# Patient Record
Sex: Female | Born: 1940 | Race: White | Hispanic: No | Smoking: Never smoker
Health system: Southern US, Community
[De-identification: ages and names within clinical notes are randomized; demographics above are authoritative.]

## PROBLEM LIST (undated history)

## (undated) DIAGNOSIS — T18108A Unspecified foreign body in esophagus causing other injury, initial encounter: Secondary | ICD-10-CM

## (undated) DIAGNOSIS — F32A Depression, unspecified: Secondary | ICD-10-CM

## (undated) DIAGNOSIS — F419 Anxiety disorder, unspecified: Secondary | ICD-10-CM

## (undated) DIAGNOSIS — K219 Gastro-esophageal reflux disease without esophagitis: Secondary | ICD-10-CM

## (undated) DIAGNOSIS — K760 Fatty (change of) liver, not elsewhere classified: Secondary | ICD-10-CM

## (undated) DIAGNOSIS — E669 Obesity, unspecified: Secondary | ICD-10-CM

## (undated) DIAGNOSIS — E039 Hypothyroidism, unspecified: Secondary | ICD-10-CM

## (undated) DIAGNOSIS — E079 Disorder of thyroid, unspecified: Secondary | ICD-10-CM

## (undated) DIAGNOSIS — F329 Major depressive disorder, single episode, unspecified: Secondary | ICD-10-CM

## (undated) HISTORY — DX: Anxiety disorder, unspecified: F41.9

## (undated) HISTORY — DX: Depression, unspecified: F32.A

## (undated) HISTORY — PX: APPENDECTOMY: SHX54

## (undated) HISTORY — DX: Unspecified foreign body in esophagus causing other injury, initial encounter: T18.108A

## (undated) HISTORY — DX: Major depressive disorder, single episode, unspecified: F32.9

## (undated) HISTORY — PX: TONSILLECTOMY: SUR1361

---

## 2002-12-27 DIAGNOSIS — F411 Generalized anxiety disorder: Secondary | ICD-10-CM | POA: Insufficient documentation

## 2003-03-08 DIAGNOSIS — M549 Dorsalgia, unspecified: Secondary | ICD-10-CM | POA: Insufficient documentation

## 2009-07-03 DIAGNOSIS — K222 Esophageal obstruction: Secondary | ICD-10-CM | POA: Insufficient documentation

## 2009-07-03 HISTORY — DX: Esophageal obstruction: K22.2

## 2010-11-15 ENCOUNTER — Ambulatory Visit: Payer: Self-pay | Admitting: Family Medicine

## 2011-11-09 ENCOUNTER — Emergency Department: Payer: Self-pay | Admitting: Emergency Medicine

## 2012-01-12 ENCOUNTER — Ambulatory Visit: Payer: Self-pay | Admitting: Gastroenterology

## 2012-03-25 DIAGNOSIS — F329 Major depressive disorder, single episode, unspecified: Secondary | ICD-10-CM | POA: Insufficient documentation

## 2012-03-25 DIAGNOSIS — F32A Depression, unspecified: Secondary | ICD-10-CM | POA: Insufficient documentation

## 2012-03-25 DIAGNOSIS — E039 Hypothyroidism, unspecified: Secondary | ICD-10-CM | POA: Insufficient documentation

## 2012-06-21 ENCOUNTER — Emergency Department: Payer: Self-pay | Admitting: Emergency Medicine

## 2012-06-30 ENCOUNTER — Ambulatory Visit: Payer: Self-pay | Admitting: Orthopedic Surgery

## 2012-08-09 ENCOUNTER — Ambulatory Visit: Payer: Self-pay | Admitting: Internal Medicine

## 2012-12-27 ENCOUNTER — Ambulatory Visit: Payer: Self-pay | Admitting: Gastroenterology

## 2013-07-25 ENCOUNTER — Ambulatory Visit: Payer: Self-pay | Admitting: Internal Medicine

## 2013-07-25 LAB — CBC CANCER CENTER
Basophil: 1 %
EOS PCT: 2 %
HCT: 46.1 % (ref 35.0–47.0)
HGB: 14.6 g/dL (ref 12.0–16.0)
Lymphocytes: 28 %
MCH: 27.5 pg (ref 26.0–34.0)
MCHC: 31.6 g/dL — ABNORMAL LOW (ref 32.0–36.0)
MCV: 87 fL (ref 80–100)
Monocytes: 7 %
Platelet: 77 x10 3/mm — ABNORMAL LOW (ref 150–440)
RBC: 5.3 10*6/uL — ABNORMAL HIGH (ref 3.80–5.20)
RDW: 23.1 % — ABNORMAL HIGH (ref 11.5–14.5)
Segmented Neutrophils: 60 %
VARIANT LYMPHOCYTE - H4-RLYMPH: 2 %
WBC: 3.1 x10 3/mm — ABNORMAL LOW (ref 3.6–11.0)

## 2013-07-25 LAB — RETICULOCYTES
ABSOLUTE RETIC COUNT: 0.0448 10*6/uL (ref 0.019–0.186)
Reticulocyte: 0.84 % (ref 0.4–3.1)

## 2013-07-25 LAB — APTT: Activated PTT: 39.1 secs — ABNORMAL HIGH (ref 23.6–35.9)

## 2013-07-25 LAB — IRON AND TIBC
IRON BIND. CAP.(TOTAL): 325 ug/dL (ref 250–450)
IRON SATURATION: 41 %
Iron: 134 ug/dL (ref 50–170)
Unbound Iron-Bind.Cap.: 191 ug/dL

## 2013-07-25 LAB — PROTIME-INR
INR: 1.2
Prothrombin Time: 14.7 secs (ref 11.5–14.7)

## 2013-07-25 LAB — FOLATE: Folic Acid: 5.8 ng/mL (ref 3.1–100.0)

## 2013-07-25 LAB — FIBRIN DEGRADATION PROD.(ARMC ONLY): Fibrin Degradation Prod.: <10 ug/ml (ref 2.1–7.7)

## 2013-07-25 LAB — FERRITIN: FERRITIN (ARMC): 36 ng/mL (ref 8–388)

## 2013-07-25 LAB — LACTATE DEHYDROGENASE: LDH: 251 U/L — AB (ref 81–246)

## 2013-07-25 LAB — FIBRINOGEN: Fibrinogen: 339 mg/dL (ref 210–470)

## 2013-07-26 LAB — URINE IEP, RANDOM

## 2013-08-10 ENCOUNTER — Ambulatory Visit: Payer: Self-pay | Admitting: Internal Medicine

## 2013-08-19 ENCOUNTER — Ambulatory Visit: Payer: Self-pay | Admitting: Internal Medicine

## 2013-08-19 ENCOUNTER — Emergency Department: Payer: Self-pay | Admitting: Emergency Medicine

## 2013-08-19 LAB — CBC WITH DIFFERENTIAL/PLATELET
Basophil #: 0.1 10*3/uL (ref 0.0–0.1)
Basophil %: 1.2 %
EOS PCT: 3.3 %
Eosinophil #: 0.2 10*3/uL (ref 0.0–0.7)
HCT: 49 % — AB (ref 35.0–47.0)
HGB: 15.8 g/dL (ref 12.0–16.0)
LYMPHS ABS: 1 10*3/uL (ref 1.0–3.6)
Lymphocyte %: 21.1 %
MCH: 28.7 pg (ref 26.0–34.0)
MCHC: 32.3 g/dL (ref 32.0–36.0)
MCV: 89 fL (ref 80–100)
MONO ABS: 0.5 x10 3/mm (ref 0.2–0.9)
Monocyte %: 10.5 %
NEUTROS ABS: 3 10*3/uL (ref 1.4–6.5)
NEUTROS PCT: 63.9 %
Platelet: 88 10*3/uL — ABNORMAL LOW (ref 150–440)
RBC: 5.51 10*6/uL — ABNORMAL HIGH (ref 3.80–5.20)
RDW: 16.5 % — ABNORMAL HIGH (ref 11.5–14.5)
WBC: 4.7 10*3/uL (ref 3.6–11.0)

## 2013-08-19 LAB — URINALYSIS, COMPLETE
BACTERIA: NONE SEEN
Bilirubin,UR: NEGATIVE
Glucose,UR: NEGATIVE mg/dL (ref 0–75)
Ketone: NEGATIVE
NITRITE: NEGATIVE
PROTEIN: NEGATIVE
Ph: 6 (ref 4.5–8.0)
RBC,UR: 7 /HPF (ref 0–5)
Specific Gravity: 1.012 (ref 1.003–1.030)
Squamous Epithelial: 4

## 2013-08-19 LAB — COMPREHENSIVE METABOLIC PANEL
ALBUMIN: 3.7 g/dL (ref 3.4–5.0)
ANION GAP: 6 — AB (ref 7–16)
AST: 43 U/L — AB (ref 15–37)
Alkaline Phosphatase: 118 U/L — ABNORMAL HIGH
BILIRUBIN TOTAL: 0.5 mg/dL (ref 0.2–1.0)
BUN: 10 mg/dL (ref 7–18)
CHLORIDE: 107 mmol/L (ref 98–107)
CREATININE: 0.77 mg/dL (ref 0.60–1.30)
Calcium, Total: 8.9 mg/dL (ref 8.5–10.1)
Co2: 26 mmol/L (ref 21–32)
EGFR (African American): >60
EGFR (Non-African Amer.): >60
GLUCOSE: 95 mg/dL (ref 65–99)
Osmolality: 276 (ref 275–301)
POTASSIUM: 3.9 mmol/L (ref 3.5–5.1)
SGPT (ALT): 33 U/L (ref 12–78)
SODIUM: 139 mmol/L (ref 136–145)
TOTAL PROTEIN: 7.6 g/dL (ref 6.4–8.2)

## 2013-08-19 LAB — LIPASE, BLOOD: Lipase: 159 U/L (ref 73–393)

## 2013-08-20 ENCOUNTER — Emergency Department: Payer: Self-pay | Admitting: Emergency Medicine

## 2013-08-20 LAB — CBC WITH DIFFERENTIAL/PLATELET
Basophil #: 0.1 10*3/uL (ref 0.0–0.1)
Basophil %: 1 %
EOS PCT: 3 %
Eosinophil #: 0.2 10*3/uL (ref 0.0–0.7)
HCT: 49.9 % — ABNORMAL HIGH (ref 35.0–47.0)
HGB: 16.7 g/dL — ABNORMAL HIGH (ref 12.0–16.0)
LYMPHS PCT: 18.1 %
Lymphocyte #: 1 10*3/uL (ref 1.0–3.6)
MCH: 29.6 pg (ref 26.0–34.0)
MCHC: 33.5 g/dL (ref 32.0–36.0)
MCV: 88 fL (ref 80–100)
Monocyte #: 0.5 x10 3/mm (ref 0.2–0.9)
Monocyte %: 9.5 %
NEUTROS PCT: 68.4 %
Neutrophil #: 3.8 10*3/uL (ref 1.4–6.5)
PLATELETS: 103 10*3/uL — AB (ref 150–440)
RBC: 5.66 10*6/uL — AB (ref 3.80–5.20)
RDW: 16.9 % — AB (ref 11.5–14.5)
WBC: 5.6 10*3/uL (ref 3.6–11.0)

## 2013-08-20 LAB — BASIC METABOLIC PANEL
Anion Gap: 8 (ref 7–16)
BUN: 11 mg/dL (ref 7–18)
CALCIUM: 9.4 mg/dL (ref 8.5–10.1)
CHLORIDE: 107 mmol/L (ref 98–107)
CREATININE: 0.93 mg/dL (ref 0.60–1.30)
Co2: 23 mmol/L (ref 21–32)
EGFR (African American): >60
Glucose: 116 mg/dL — ABNORMAL HIGH (ref 65–99)
Osmolality: 276 (ref 275–301)
Potassium: 3.7 mmol/L (ref 3.5–5.1)
Sodium: 138 mmol/L (ref 136–145)

## 2013-08-20 LAB — TROPONIN I

## 2013-08-21 ENCOUNTER — Observation Stay: Payer: Self-pay | Admitting: Internal Medicine

## 2013-08-21 LAB — CK TOTAL AND CKMB (NOT AT ARMC)
CK, Total: 108 U/L
CK, Total: 120 U/L
CK-MB: 1.2 ng/mL (ref 0.5–3.6)
CK-MB: 1.3 ng/mL (ref 0.5–3.6)

## 2013-08-21 LAB — COMPREHENSIVE METABOLIC PANEL
ALBUMIN: 3.8 g/dL (ref 3.4–5.0)
ANION GAP: 6 — AB (ref 7–16)
Alkaline Phosphatase: 95 U/L
BUN: 13 mg/dL (ref 7–18)
Bilirubin,Total: 0.5 mg/dL (ref 0.2–1.0)
CHLORIDE: 105 mmol/L (ref 98–107)
CO2: 28 mmol/L (ref 21–32)
Calcium, Total: 8.6 mg/dL (ref 8.5–10.1)
Creatinine: 1.1 mg/dL (ref 0.60–1.30)
EGFR (African American): 58 — ABNORMAL LOW
EGFR (Non-African Amer.): 50 — ABNORMAL LOW
GLUCOSE: 120 mg/dL — AB (ref 65–99)
Osmolality: 279 (ref 275–301)
Potassium: 3.9 mmol/L (ref 3.5–5.1)
SGOT(AST): 44 U/L — ABNORMAL HIGH (ref 15–37)
SGPT (ALT): 32 U/L (ref 12–78)
SODIUM: 139 mmol/L (ref 136–145)
TOTAL PROTEIN: 7.6 g/dL (ref 6.4–8.2)

## 2013-08-21 LAB — URINALYSIS, COMPLETE
Bacteria: NONE SEEN
Bilirubin,UR: NEGATIVE
GLUCOSE, UR: NEGATIVE mg/dL (ref 0–75)
Ketone: NEGATIVE
Leukocyte Esterase: NEGATIVE
Nitrite: NEGATIVE
Ph: 5 (ref 4.5–8.0)
Specific Gravity: 1.021 (ref 1.003–1.030)
WBC UR: 3 /HPF (ref 0–5)

## 2013-08-21 LAB — TROPONIN I
Troponin-I: <0.02 ng/mL
Troponin-I: <0.02 ng/mL

## 2013-08-21 LAB — CBC
HCT: 48 % — ABNORMAL HIGH (ref 35.0–47.0)
HGB: 16 g/dL (ref 12.0–16.0)
MCH: 29.8 pg (ref 26.0–34.0)
MCHC: 33.3 g/dL (ref 32.0–36.0)
MCV: 90 fL (ref 80–100)
Platelet: 82 10*3/uL — ABNORMAL LOW (ref 150–440)
RBC: 5.36 10*6/uL — ABNORMAL HIGH (ref 3.80–5.20)
RDW: 16.5 % — AB (ref 11.5–14.5)
WBC: 4.8 10*3/uL (ref 3.6–11.0)

## 2013-08-22 LAB — CBC WITH DIFFERENTIAL/PLATELET
Basophil #: 0 10*3/uL (ref 0.0–0.1)
Basophil %: 0.9 %
Eosinophil #: 0.1 10*3/uL (ref 0.0–0.7)
Eosinophil %: 3.3 %
HCT: 45.7 % (ref 35.0–47.0)
HGB: 14.9 g/dL (ref 12.0–16.0)
Lymphocyte #: 0.7 10*3/uL — ABNORMAL LOW (ref 1.0–3.6)
Lymphocyte %: 19.4 %
MCH: 29.1 pg (ref 26.0–34.0)
MCHC: 32.5 g/dL (ref 32.0–36.0)
MCV: 89 fL (ref 80–100)
MONO ABS: 0.4 x10 3/mm (ref 0.2–0.9)
Monocyte %: 9.7 %
NEUTROS PCT: 66.7 %
Neutrophil #: 2.6 10*3/uL (ref 1.4–6.5)
PLATELETS: 73 10*3/uL — AB (ref 150–440)
RBC: 5.12 10*6/uL (ref 3.80–5.20)
RDW: 15.2 % — AB (ref 11.5–14.5)
WBC: 3.8 10*3/uL (ref 3.6–11.0)

## 2013-08-22 LAB — BASIC METABOLIC PANEL
Anion Gap: 7 (ref 7–16)
BUN: 11 mg/dL (ref 7–18)
CO2: 27 mmol/L (ref 21–32)
Calcium, Total: 8.8 mg/dL (ref 8.5–10.1)
Chloride: 103 mmol/L (ref 98–107)
Creatinine: 0.96 mg/dL (ref 0.60–1.30)
EGFR (Non-African Amer.): 59 — ABNORMAL LOW
Glucose: 153 mg/dL — ABNORMAL HIGH (ref 65–99)
Osmolality: 276 (ref 275–301)
Potassium: 3.6 mmol/L (ref 3.5–5.1)
Sodium: 137 mmol/L (ref 136–145)

## 2013-08-22 LAB — LIPID PANEL
Cholesterol: 130 mg/dL (ref 0–200)
HDL Cholesterol: 46 mg/dL (ref 40–60)
Ldl Cholesterol, Calc: 70 mg/dL (ref 0–100)
TRIGLYCERIDES: 71 mg/dL (ref 0–200)
VLDL Cholesterol, Calc: 14 mg/dL (ref 5–40)

## 2013-08-23 ENCOUNTER — Ambulatory Visit: Payer: Self-pay | Admitting: Neurology

## 2013-08-23 LAB — URINE CULTURE

## 2013-08-25 ENCOUNTER — Ambulatory Visit: Payer: Self-pay | Admitting: Internal Medicine

## 2013-08-28 ENCOUNTER — Emergency Department: Payer: Self-pay | Admitting: Emergency Medicine

## 2013-09-05 ENCOUNTER — Ambulatory Visit: Payer: Self-pay | Admitting: Internal Medicine

## 2013-09-06 DIAGNOSIS — M8080XA Other osteoporosis with current pathological fracture, unspecified site, initial encounter for fracture: Secondary | ICD-10-CM | POA: Insufficient documentation

## 2013-09-07 DIAGNOSIS — R42 Dizziness and giddiness: Secondary | ICD-10-CM | POA: Insufficient documentation

## 2013-09-21 ENCOUNTER — Ambulatory Visit: Payer: Self-pay | Admitting: Internal Medicine

## 2013-09-21 LAB — CBC CANCER CENTER
BANDS NEUTROPHIL: 1 %
Basophil: 1 %
Comment - H1-Com1: NORMAL
EOS PCT: 1 %
HCT: 46.7 % (ref 35.0–47.0)
HGB: 15.3 g/dL (ref 12.0–16.0)
Lymphocytes: 29 %
MCH: 30.1 pg (ref 26.0–34.0)
MCHC: 32.8 g/dL (ref 32.0–36.0)
MCV: 92 fL (ref 80–100)
MONOS PCT: 5 %
Platelet: 67 x10 3/mm — ABNORMAL LOW (ref 150–440)
RBC: 5.1 10*6/uL (ref 3.80–5.20)
RDW: 14.6 % — AB (ref 11.5–14.5)
Segmented Neutrophils: 63 %
WBC: 3.1 x10 3/mm — AB (ref 3.6–11.0)

## 2013-09-21 LAB — RETICULOCYTES
Absolute Retic Count: 0.0516 10*6/uL (ref 0.019–0.186)
Reticulocyte: 1.01 % (ref 0.4–3.1)

## 2013-09-26 DIAGNOSIS — W19XXXA Unspecified fall, initial encounter: Secondary | ICD-10-CM | POA: Insufficient documentation

## 2013-09-26 DIAGNOSIS — R270 Ataxia, unspecified: Secondary | ICD-10-CM | POA: Insufficient documentation

## 2013-10-01 DIAGNOSIS — K7581 Nonalcoholic steatohepatitis (NASH): Secondary | ICD-10-CM | POA: Insufficient documentation

## 2013-10-01 DIAGNOSIS — I951 Orthostatic hypotension: Secondary | ICD-10-CM | POA: Insufficient documentation

## 2013-10-01 DIAGNOSIS — M1990 Unspecified osteoarthritis, unspecified site: Secondary | ICD-10-CM | POA: Insufficient documentation

## 2013-10-19 ENCOUNTER — Ambulatory Visit: Payer: Self-pay | Admitting: Internal Medicine

## 2013-11-30 DIAGNOSIS — R262 Difficulty in walking, not elsewhere classified: Secondary | ICD-10-CM | POA: Insufficient documentation

## 2013-11-30 DIAGNOSIS — E668 Other obesity: Secondary | ICD-10-CM | POA: Insufficient documentation

## 2014-05-01 DIAGNOSIS — M75122 Complete rotator cuff tear or rupture of left shoulder, not specified as traumatic: Secondary | ICD-10-CM | POA: Insufficient documentation

## 2014-05-01 DIAGNOSIS — M65919 Unspecified synovitis and tenosynovitis, unspecified shoulder: Secondary | ICD-10-CM | POA: Insufficient documentation

## 2014-05-01 DIAGNOSIS — M65811 Other synovitis and tenosynovitis, right shoulder: Secondary | ICD-10-CM | POA: Insufficient documentation

## 2014-05-01 DIAGNOSIS — M65819 Other synovitis and tenosynovitis, unspecified shoulder: Secondary | ICD-10-CM | POA: Insufficient documentation

## 2014-05-18 ENCOUNTER — Encounter: Payer: Self-pay | Admitting: Surgery

## 2014-05-22 ENCOUNTER — Encounter: Payer: Self-pay | Admitting: Surgery

## 2014-06-20 ENCOUNTER — Encounter
Admit: 2014-06-20 | Disposition: A | Discharge status: Home / Self Care | Payer: Self-pay | Attending: Surgery | Admitting: Surgery

## 2014-08-12 NOTE — Consult Note (Signed)
Brief Consult Note: Diagnosis: Major depressive disorder recurrent moderate.   Patient was seen by consultant.   Consult note dictated.   Recommend further assessment or treatment.   Orders entered.   Comments: Cassidy [name] has a h/o depression. She has been in the care of Dr. Timmothy Euler for 13 yars. She recently switched care to Dr. Annitta Jersey and medication adjustments have been made. She had a brief period of delirium on Tuesday that resolved completely. She was admitted after a fall.  Spoke with Dr. Annitta Jersey suggested further medication adjustment.  PLAN: 1. Lower Lexapro to 10 mg.  2. Hold Wellbutrin for now.  3. Continue Buspar 15 mg bid.  4. Continue Ambien 10 mg.  5. D/c benzodiazepines.   6. I will follow along.  Electronic Signatures: Orson Slick (MD)  (Signed [phone] 15:51)  Authored: Brief Consult Note   Last Updated: 04-May-15 15:51 by Orson Slick (MD)

## 2014-08-12 NOTE — Consult Note (Signed)
Brief Consult Note: Diagnosis: recurrent dysphagia/nausea.   Patient was seen by consultant.   Comments: EGD with Dr Allen Norris tomorrow wih esophageal dilation Thanks for allowing [place] to participate in her care.  Please see full dictated note. #[id number].  Electronic Signatures: Andria Meuse (NP)  (Signed [phone] 21:06)  Authored: Brief Consult Note   Last Updated: 04-May-15 21:06 by Andria Meuse (NP)

## 2014-08-12 NOTE — Consult Note (Signed)
Brief Consult Note: Diagnosis: Major depressive disorder recurrent moderate.   Patient was seen by consultant.   Consult note dictated.   Recommend further assessment or treatment.   Orders entered.   Comments: Cassidy [name] has a h/o depression. She has been in the care of Dr. Timmothy Euler for 13 yars. She recently switched care to Dr. Annitta Jersey and medication adjustments have been made. She had a brief period of delirium on Tuesday that resolved completely. She was admitted after a fall.  Cassidy [name] slept well last night with Ambien, possibly too soundly. She is alert and oriented, pleasant, no safety issues, no psychotic symptoms, good insight and judgment.   PLAN: 1. Will continue Lexapro to 10 mg and Buspar 15 mg bid as per Dr. Annitta Jersey.  2. Will lower Ambien to 5 mg tonight.  3. D/c benzodiazepines.   4. I will follow along.  Electronic Signatures: Orson Slick (MD)  (Signed [phone] 16:17)  Authored: Brief Consult Note   Last Updated: 05-May-15 16:17 by Orson Slick (MD)

## 2014-08-12 NOTE — H&P (Signed)
PATIENT NAME:  Cassidy [name], Cassidy [name] MR#:  [id number] DATE OF BIRTH:  1940/09/13  DATE OF ADMISSION:  08/21/2013  PRIMARY CARE PHYSICIAN: Dr. Frazier Richards  CHIEF COMPLAINT: Fall.   HISTORY OF PRESENT ILLNESS: This is a 74 year old female who has been having problems in the past with low blood pressure. She was started on Florinef to increase her blood pressure. She was also put on Tandem iron secondary to low blood counts, and was going to be scheduled for an outpatient endoscopy on Thursday for esophageal stricture. She does have to drink a lot of hot water when she swallows pills to get her pills down. She saw a new psychiatrist on Monday that took her off trazodone and halved her Seroquel to taper off the Seroquel, and was started on Wellbutrin and Ambien. After the first dose of the Ambien, she started hallucinating Monday night, could not walk or move her arms, and she was talking out of her head. This is her third time back to the Emergency Room. She was given some Ativan last night, and slept well. Now she has increased blood pressure. She had nausea, vomiting, diarrhea on Friday. She has hot and cold feeling. Her room is spinning. This morning at 9:00 a.m. she had a fall, and has a laceration on her forehead. Hospitalist services were contacted for further evaluation.   PAST MEDICAL HISTORY: Depression, anxiety, osteoporosis, esophageal stricture, hypothyroidism.   PAST SURGICAL HISTORY: Tonsillectomy, appendectomy.   ALLERGIES: SULFA.   MEDICATIONS: As per prescription writer, include Ativan 1 mg twice a day as needed, Wellbutrin extended release 150 mg daily, BuSpar 15 mg twice a day, Colace 100 mg twice a day, Lexapro 20 mg daily, Excedrin every 6 hours as needed for pain, Florinef 0.1 mg daily, Flonase nasal spray 50 mcg per inhalation each nostril daily, levothyroxine 75 mcg daily, just recently started on Macrobid 100 mg twice a day, omeprazole 20 mg twice a day, Tandem iron 1 tablet twice  a day, Zofran 4 mg every 8 hours as needed for nausea/vomiting, was recently started on Ambien, unclear of the dose.   SOCIAL HISTORY: No smoking. No alcohol. No drug use. Lives with her grandson. 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   FAMILY HISTORY: Father died of an MI. Mother died of kidney failure, had diabetes. Sister with colon cancer. Brother with atrial fibrillation. Sister with metastatic breast cancer to bone.   REVIEW OF SYSTEMS: CONSTITUTIONAL: Positive for chills. Positive for sweats. Positive for weight gain, then loss. Positive for fatigue.  EYES: No blurry vision.  EARS, NOSE, MOUTH AND THROAT: No hearing loss. Positive for dysphagia to solids and pills.  CARDIOVASCULAR: No chest pain. No palpitations.  RESPIRATORY: Positive for shortness of breath. Positive for cough. No sputum. No hemoptysis.  GASTROINTESTINAL: Positive for nausea, vomiting, and diarrhea on Friday. No bright red blood per rectum. No melena.  GENITOURINARY: No burning on urination or hematuria.  MUSCULOSKELETAL: Positive for back pain and knee pain.  INTEGUMENT: No rashes or eruptions.  NEUROLOGIC: Blackouts in the past.  PSYCHIATRIC: On medication for anxiety and depression.  ENDOCRINE: Positive for hypothyroidism.  HEMATOLOGIC AND LYMPHATIC: Positive for anemia.   PHYSICAL EXAMINATION: VITAL SIGNS: Temperature 98.3, pulse 88, respirations 14, blood pressure 149/63, pulse ox 94% on room air. The patient is not orthostatic.  EYES: Conjunctivae and lids normal. Pupils equal, round, and reactive to light. Extraocular muscles intact. No nystagmus.  EARS, NOSE, MOUTH AND THROAT: Tympanic membranes: No  erythema. Nasal mucosa: No erythema. Throat: No erythema. No exudate seen. Lips and gums: No lesions.  NECK: No JVD. No bruits. No lymphadenopathy. No thyromegaly. No thyroid nodules palpated.  RESPIRATORY: Lungs clear to auscultation. No use of accessory muscles to breathe. No rhonchi, rales,  or wheeze heard.  CARDIOVASCULAR SYSTEM: S1, S2 normal. No gallops, rubs, or murmurs heard. Carotid upstroke 2+ bilaterally. No bruits.  EXTREMITIES: Dorsalis pedis pulses 2+ bilaterally. No edema of the lower extremity.  ABDOMEN: Soft, nontender. No organomegaly/splenomegaly. Normoactive bowel sounds. No masses felt.  LYMPHATIC: No lymph nodes in the neck.  MUSCULOSKELETAL: No clubbing, cyanosis or edema. SKIN: A laceration right forehead, a bruise around the right eye.  NEUROLOGIC: Cranial nerves II through XII grossly intact. Deep tendon reflexes 2+ bilateral lower extremities.  PSYCHIATRIC: The patient is oriented to person, place, and time.   LABORATORY AND RADIOLOGICAL DATA: CT scan of the head: Right forehead soft tissue swelling, without fracture. No evidence of intracranial abnormality. Cervical spine x-ray:  Degenerative disk disease. Glucose 120, BUN 13, creatinine 1.1, sodium 139, potassium 3.9, chloride 105, CO2 of 28, calcium 8.6. Liver function tests: AST elevated at 44. White blood cell count 4.8, H and H 16.0 and 48.0, platelet count of 82. Troponin negative. Urinalysis: 1+ blood.   EKG: Normal sinus rhythm, no acute ST-T wave changes.   ASSESSMENT AND PLAN: 1.  Dizziness, fall. Likely medication reaction with Ambien and new medications started. Will stop Ambien and Wellbutrin at this time. Will get a Physical Therapy evaluation. This is not an ear infection. Could be inner ear issue, with her dizziness. Will see how she does with physical therapy. She did have a laceration of the head, which was sutured up by Dr. Benjaman Lobe, ER physician. Will continue to check orthostatics. Since the patient's blood pressure is now high, will stop the Florinef and see what happens. Will observe overnight on telemetry.   2.  Anxiety and depression. The patient wants to come off the trazodone and Seroquel. Will continue off of those medications, but will also keep off the Ambien and Wellbutrin at this  time.   3.  Hypothyroidism: Continue levothyroxine.   4.  Esophageal stricture. Has an appointment with Dr. Candace Cruise for an endoscopy with dilatation on Thursday. I would not do that while she is here. I do not want to put her under a sedative procedure, since she has having other problems with medications at this time. Can probably be done on Thursday.   5.  Recent urinary tract infection. Unfortunately, no urine culture sent from the urine last time she was in the ER. Nitrites and leukocyte esterase is negative. Will hold off on the Macrobid at this point, and check a urine culture.   Time spent on admission:  55 minutes.   The patient is a FULL CODE.    ____________________________ Tana Conch. Leslye Peer, MD rjw:mr D: 08/21/2013 14:22:52 ET T: 08/21/2013 16:46:19 ET JOB#: [id number]  cc: Tana Conch. Leslye Peer, MD, <Dictator> Ocie Cornfield. Ouida Sills, MD  Marisue Brooklyn MD ELECTRONICALLY SIGNED 08/22/2013 10:37

## 2014-08-12 NOTE — Consult Note (Signed)
PATIENT NAME:  Cassidy [name], Cassidy [name] MR#:  [id number] DATE OF BIRTH:  1940-11-05  DATE OF ADMISSION:  08/21/2013 DATE OF CONSULTATION:  08/22/2013  REFERRING PHYSICIAN:  Dr. Frazier Richards CONSULTING PHYSICIAN:  Harim Bi B. Jatavious Peppard, MD  REASON FOR CONSULTATION:  To evaluate a delirious patient.   IDENTIFYING DATA:  Cassidy [name] is a 74 year old female with a long history of depression.   CHIEF COMPLAINT: "I fell."   HISTORY OF PRESENT ILLNESS: Cassidy [name] has a long history of depression. She has been a patient of Dr. Timmothy Euler for the past 13 years. She has been maintained on a combination of Seroquel and trazodone, doing very well. However, over the years, she gained a lot of weight that she is trying to lose now. She also, per her sister's report, oftentimes has been asleep so deeply at night that the family has been worried about overmedication. Dr. Timmothy Euler no longer sees patients with managed Medicaid and since January was no longer able to treat her. I imagine that she had refused up until now, but on Monday, April 27, she did see Dr. Annitta Jersey, her new psychiatrist. Dr. Annitta Jersey introduced multiple medication changes. First, she has been trying to take the patient off the Seroquel gradually. Second, she did not like trazodone for its effect on QTc length. She was trying to introduce a new antidepressant, and the patient was started on Lexapro, Wellbutrin and BuSpar. She was also given Ambien 10 mg at night for sleep. Per Dr. Ernestene Kiel and P, the patient started hallucinating after the first dose of Ambien. When I talked to the patient, and especially her sister in greater detail, it is not clear whether the patient followed  instruction from Dr. Annitta Jersey. When they counted pills of Seroquel, they believed that the patient could have taken more. It is really confusing and impossible to know what really happened. The patient, for several hours, was confused and hallucinating; specifically, she had visual  hallucinations of dogs made of snow on the ceiling. The family was frightened, but the symptoms of confusion and hallucinations suggestive of delirium have resolved. At the same time, the patient was experiencing other symptoms. She had diarrhea, was nauseated and vomiting and came to the Emergency Room several times. At some point, she was given probably Phenergan for nausea. At another time, she was given Ativan for presumed anxiety. She was found on the floor of her apartment with a cut on her forehead and black eye. She reports that while walking back from her kitchen, she felt she did not feel lightheaded. She did not have a spinning in her head. She did not feel palpitations. This is not the first time the patient experienced a fall. It happened several times in the past, according to her sister, but she never injured herself. The patient really denies any symptoms of depression, anxiety, psychosis or symptoms suggestive of bipolar mania. In fact, she was doing well on her old regimen of medicines, but wants to make changes. This push for a change is fostered by her family.   PAST PSYCHIATRIC HISTORY:  The patient was hospitalized once many, many years ago for worsening of depression. She denies ever attempting suicide. She is compliant with medications as they are prescribed.   FAMILY PSYCHIATRIC HISTORY:  Nobody diagnosed, but per sister, everybody has "something."  No completed suicides in the family.   PAST MEDICAL HISTORY:  Osteoporosis, esophageal stricture, hypothyroidism, syncope.   ALLERGIES:  SULFA DRUGS.   MEDICATIONS AT THE TIME  OF ADMISSION: Ativan 1 mg twice daily as needed, Wellbutrin ER 150 mg daily, BuSpar 15 mg twice daily, Colace 100 mg twice daily, Lexapro 20 mg daily, Excedrin as needed, Florinef 0.1 mg daily, Flonase 50 mcg daily, Synthroid 75 mcg daily, Macrobid 100 mg twice daily, omeprazole 20 mg daily, iron daily, Zofran every 8 hours as needed, Ambien 10 mg.   MEDICATIONS  AT THE TIME OF CONSULTATION:  Tylenol as needed, BuSpar 15 mg twice daily, Colace 100 mg twice daily, Flonase daily, iron daily, Synthroid 0.075 mg daily, Ativan 1 mg twice daily, Antivert 25 mg 3 times daily as needed, Zofran as needed, pantoprazole 40 mg twice daily, Lexapro 20 mg daily, ibuprofen 400 mg as needed for pain.   SOCIAL HISTORY:  She lives with her grandson. She takes excellent care of the household, and the sister is worried that maybe there is some minor cognitive decline detectable at times.   REVIEW OF SYSTEMS: CONSTITUTIONAL:  No fevers or chills. Positive for fatigue.  EYES:  No double or blurred vision. Positive for back eye.  ENT:  No hearing loss.  RESPIRATORY:  No shortness of breath or cough.  CARDIOVASCULAR:  No chest pain or orthopnea.  GASTROINTESTINAL:  No abdominal pain, nausea, vomiting or diarrhea.  GENITOURINARY:  No incontinence or frequency.  ENDOCRINE:  No heat or cold intolerance.  LYMPHATIC:  No anemia or easy bruising.  INTEGUMENTARY:  No acne or rash.  MUSCULOSKELETAL:  No muscle or joint pain.  NEUROLOGIC:  Status post fall.  PSYCHIATRIC:  See history of present illness for details.   PHYSICAL EXAMINATION: VITAL SIGNS:  Blood pressure 118/76, pulse 82, respirations 18, temperature 98.2.  GENERAL:  This is an obese, elderly female in no acute distress. The rest of the physical examination is deferred to her primary attending.   LABORATORY DATA:  Chemistries are within normal limits except for blood glucose of 153. LFTs within normal limits except for AST of 44. Cardiac enzymes negative x 3. CBC within normal limits. Urinalysis is not suggestive of urinary tract infection.   EKG:  Normal sinus rhythm, inferior infarct listed before in May 2015, abnormal EKG.   MENTAL STATUS EXAMINATION:  The patient is alert and oriented to person, place, time and somewhat to situation. She does not remember all details leading to her admission. She is pleasant,  polite and cooperative. She maintains good eye contact. Her speech is soft. Mood is okay with full affect. Thought process is logical and goal oriented. Thought content: She denies suicidal or homicidal ideation. There are no delusions or paranoia. There are no auditory or visual hallucinations. Her cognition is grossly intact. Her registration, recall, short and long-term memory are intact. She is a good historian. She is of average intelligence and fund of knowledge. She has good insight and judgment.    DIAGNOSES: AXIS I:  Major depressive disorder, recurrent, moderate. Anxiety disorder, not otherwise specified. Rule out the cognitive disorder, not otherwise specified.  AXIS II:  Deferred.  AXIS III:  Hypothyroidism, syncope, osteoporosis, esophageal stricture.  AXIS IV:  Mental and physical illness, recent medication adjustments, change of providers, possible cognitive decline.  AXIS V:  Global assessment of functioning 55.   PLAN:  I spoke with Dr. Annitta Jersey. She wanted to me to adjust medications as follows: Lower Lexapro to 10 mg daily, stop Wellbutrin altogether, continue Ambien 10 mg at bedtime, continue BuSpar 15 mg twice daily. The patient already completed taper of Seroquel. She is also off  trazodone.   I will follow along.    ____________________________ Wardell Honour. Devani Odonnel, MD jbp:[id number] D: 08/22/2013 19:35:00 ET T: 08/22/2013 21:29:24 ET JOB#: [id number]  cc: Arryn Terrones B. Bary Leriche, MD, <Dictator> Clovis Fredrickson MD ELECTRONICALLY SIGNED 08/27/2013 7:51

## 2014-08-12 NOTE — Consult Note (Signed)
PATIENT NAME:  Cassidy [name], Cassidy [name] MR#:  [id number] DATE OF BIRTH:  1940-12-31  DATE OF CONSULTATION:  08/22/2013  REFERRING PHYSICIAN:  Ocie Cornfield. Ouida Sills, MD CONSULTING PHYSICIAN:  Lucilla Lame, MD; Andria Meuse, NP PRIMARY GASTROENTEROLOGIST: Lupita Dawn. Oh, MD  REASON FOR CONSULTATION: Nausea and dysphagia.   HISTORY OF PRESENT ILLNESS: Cassidy [name] is a 74 year old Caucasian female patient of Cassidy [name]. She has a history of frequent esophageal dilations for benign esophageal stricture. The patient was last dilated in September 2014 with a 54-[facility] dilator. She was admitted to the hospital with a fall where she fell and hit her head, had a laceration of her right eye, and bilateral black eyes. She has ataxia and hypotension. She is on Tandem iron at home for anemia. She was scheduled to have an EGD with esophageal dilation this week with Cassidy [name]. She has had several medication changes, which were felt to be causing her hypotension and recent fall. She did have a CT scan of her head, which showed a right forehead soft tissue swelling without fracture. She tells me  she has had problems with swallowing her pills for several months now. She regurgitates some of her food undigested within minutes after eating. She has heartburn and indigestion, which has been worse lately despite taking her Prilosec 20 mg b.i.d. She constantly has water brash. Her weight is stable. Her appetite has been poor. She had carotid Dopplers, which were negative.   PAST MEDICAL AND SURGICAL HISTORY: Multiple esophageal dilations, last being September 2014 with Cassidy [name] where she was dilated with a 54-[facility] dilator. She has a hiatal hernia. She had a colonoscopy on 01/12/2012 by Cassidy [name], which was benign. Depression, anxiety, osteoporosis, hypotension, hypothyroidism, tonsillectomy, appendectomy.   MEDICATIONS PRIOR TO ADMISSION: Ativan 1 mg b.i.d. p.r.n., Wellbutrin extended-release 150 mg daily, BuSpar 50 mg b.i.d., Colace  100 mg b.i.d., Lexapro 20 mg daily, Excedrin every 6 hours p.r.n. for pain, Florinef 0.1 mg daily, Flonase nasal spray 50 mcg in each nostril daily, levothyroxine 75 mcg daily, just recently started Macrobid 100 mg b.i.d., omeprazole 20 mg b.i.d. Tandem iron b.i.d., and Zofran 4 mg q.8 hours p.r.n. nausea and vomiting, Ambien p.r.n.   ALLERGIES: SULFA CAUSES SWELLING.   FAMILY HISTORY: Noncontributory; however, she does have a sister with history of colon cancer.   SOCIAL HISTORY: She denies any tobacco, alcohol or illicit drug use. She lives with her grandson. She is currently retired, previously worked at a Teacher, early years/pre.  REVIEW OF SYSTEMS:    CONSTITUTIONAL: She has had chills without any fever, weight gain, fatigue  NEUROLOGIC: She has had dizziness and ataxia.  MUSCULOSKELETAL: Positive for chronic back pain and joint pain, including both knees. Otherwise negative 12-point complete review of systems.   PHYSICAL EXAMINATION: VITAL SIGNS: Temperature 98.2, pulse 82, respirations 18, blood pressure 118/76, O2 sat 90% on room air.  GENERAL: She is an elderly Caucasian female who is alert, oriented, pleasant, and cooperative. She is accompanied by her sister today.  HEENT: Sclerae clear, anicteric. Conjunctivae pink. She has a laceration just above her right eyebrow. She has ecchymosis under both right and left orbits.  NECK: Supple without any mass or thyromegaly.  CHEST: Heart rate is regular without any murmurs, clicks, rubs, or gallops.  LUNGS: Clear to auscultation bilaterally.  ABDOMEN: Positive bowel sounds x 4. No bruits auscultated. Abdomen is soft, nontender, nondistended, without palpable mass or hepatosplenomegaly. No rebound tenderness or guarding.  EXTREMITIES: Without clubbing or  edema.  SKIN: Pink, warm, and dry other than ecchymosis as described above.  NEUROLOGIC: Grossly intact.  MUSCULOSKELETAL: Good equal strength and movement bilaterally.  PSYCHIATRIC: Alert,  cooperative. Normal mood and affect.   LABORATORY STUDIES: Glucose 153, otherwise normal basic metabolic panel. Triglycerides 71. Lipase 159. LFTs normal except AST of 44. Total CK, CK-MB, and troponins negative. White blood cell count 3.8, hemoglobin 14.9, hematocrit 45.7, platelet count 73.   IMPRESSION: Cassidy [name] is a 74 year old Caucasian female with history of recurrent dysphagia and multiple esophageal dilations. She has also had chronic gastroesophageal reflux disease and intermittent nausea with regurgitation of food within minutes after eating. She will have an EGD with Dr. Allen Norris tomorrow to look for recurrent esophageal stricture. She does have a hiatal hernia, which could be contributing to her regurgitation of food and nausea. I have discussed the procedure including risks and benefits, which include but are not limited to bleeding, infection, perforation, drug reaction. She agrees to the plan, and consent was obtained.   PLAN: 1.  NPO after midnight.  2.  EGD with Dr. Allen Norris tomorrow.  3.  Agree with Protonix 40 mg b.i.d.   Thank you for allowing [place] to participate in the care of Cassidy [name].   ____________________________ Andria Meuse, NP klj:jcm D: 08/22/2013 21:04:55 ET T: 08/22/2013 21:25:37 ET JOB#: [id number]  cc: Andria Meuse, NP, <Dictator> Lupita Dawn. Candace Cruise, MD Ocie Cornfield. Ouida Sills, MD  [place] SIGNED 08/30/2013 11:03

## 2014-08-12 NOTE — Discharge Summary (Signed)
PATIENT NAME:  Cassidy [name], Cassidy [name] MR#:  [id number] DATE OF BIRTH:  03/06/1941  DATE OF ADMISSION:  08/21/2013 DATE OF DISCHARGE:  08/24/2013  DISCHARGE DIAGNOSES: Per [place] discharge day form.   DISCHARGE MEDICATIONS: Per [facility] discharge day form.  HOSPITAL COURSE: The patient was admitted after falling, ataxic, confused with inability to swallow. She had EGD and esophageal dilation and did well with that. She was ambulating better without falls. Neurology saw her and thought she might have neuropathy. It was suggested for her to stop her Macrobid after discharge. The rest of her evaluation and workup was negative and can be reviewed in the in [facility] care management system if necessary. Consults from psychiatry and neurology were appreciated. Notably, she had had multiple psychiatric med changes as this was happening. Further psychiatric med changes were done by the psychiatric consultant, and again, are in the discharge day form. For further evaluation, she will see her outpatient psychiatrist soon. CT of her head and neck did not show any acute trauma, though she had soft tissue damage from the fall.  ____________________________ Ocie Cornfield. Ouida Sills, MD mwa:aw D: 08/31/2013 07:49:25 ET T: 08/31/2013 08:00:15 ET JOB#: [id number]  cc: Ocie Cornfield. Ouida Sills, MD, <Dictator> Kirk Ruths MD ELECTRONICALLY SIGNED 09/02/2013 7:18

## 2014-08-12 NOTE — Consult Note (Signed)
Referring Physician:  Wieting, Richard :   Reason for Consult: Admit Date: 21-Aug-2013  Chief Complaint: "I don't know why I keep on falling"  Reason for Consult: gait difficulties   History of Present Illness: History of Present Illness:   Ms. Cassidy [name] is a 74 yo woman with PMH notable for depression, anxiety, and osteoporosis who presented to [facility] for evalutaion of a fall with head laceration. The Neurology service has been consulted to assist in the evaluation of gait ataxia. She recently had several psychiatric medication changes in the past week, stopping Trazodone and Seroquel and starting Wellbutrin and Ambien for mood and sleep, respectively. Soon after her first dose of Ambien, she experienced hallucinations and dizziness. began falling about 3 years ago, and describes having constant problems with her balance. Rather, she feels clumsy and unsteady when trying to walk, and falls when she loses her balance. She denies any numbness or weakness, and has not had a leg "give out" on her. The falls tend to occur mostly during the day, and she has not noticed any particular surface or area in which falls are more likely. She experiences particular difficulty stepping into and out of her bathroom tub to take a shower, and her grandson recently had to install a shower bar in order for her to hold onto and keep her balance. She uses a cane for ambulation at all times, and does not use the walker that has been provided for her in the past. She is so afraid of falling outside the house that she typically stays inside, where her grandson (who lives with her) is more likely to discover her should she fall. She has difficulty in estimating how often she falls, but it appears to be more than once per month on average.  ROS:  Review of Systems   As per HPI. In addition, the patient reports fatigue, shortness of breath, cough, nausea, vomiting, and diarrhea recently. She denies visual changes, chest discomfort,  palpitations, joint tenderness, or rashes. The remainder of a full 12-point review of systems is negative.   Past Medical/Surgical Hx:  Anxiety:   Panic Attacks:   Thyroid Disease:   Hypertension:   Esophageal Stricures:   Osteoporosis:   GERD - Esophageal Reflux:   Adenoidectomy:   Tonsillectomy:   Appendectomy:   Home Medications: Medication Instructions Last Modified Date/Time  Ativan 1 mg oral tablet 1 tab(s) orally 2 times a day, As Needed 03-May-15 13:42  Zofran ODT 4 mg oral tablet, disintegrating 1 tab(s) orally 3 times a day 03-May-15 13:42  Macrobid 100 mg oral capsule 1 cap(s) orally 2 times a day 03-May-15 13:42  Tandem 162 mg-115.2 mg oral capsule 1 cap(s) orally 2 times a day 03-May-15 13:42  Excedrin 250 mg-250 mg-65 mg oral tablet 2 tab(s) orally every 6 hours, As Needed - for Pain 03-May-15 13:42  busPIRone 15 mg oral tablet 1 tab(s) orally 2 times a day 03-May-15 13:42  levothyroxine 75 mcg (0.075 mg) oral tablet 1 tab(s) orally once a day 03-May-15 13:42  fluticasone nasal 50 mcg/inh nasal spray 1 spray(s) in each nostril once a day 03-May-15 13:42  omeprazole 20 mg oral delayed release capsule 1 cap(s) orally 2 times a day 03-May-15 13:42  escitalopram 20 mg oral tablet 1 tab(s) orally once a day 03-May-15 13:42  fludrocortisone 0.1 mg oral tablet 1 tab(s) orally once a day 03-May-15 13:42  buPROPion 150 mg/12 hours oral tablet, extended release 1 tab(s) orally once a day 03-May-15 13:42    docusate sodium sodium 100 mg oral capsule 1 cap(s) orally 2 times a day 03-May-15 13:42   Allergies:  Sulfa drugs: Swelling  Social/Family History: Lives With: 84 year old grandson  Social History: Denies smoking, EtOH or illicit drug use.   Vital Signs: **Vital Signs.:   05-May-15 08:33  Vital Signs Type Q 4hr  Temperature Temperature (F) 97.7  Celsius 36.5  Temperature Source oral  Pulse Pulse 75  Respirations Respirations 18  Systolic BP Systolic BP 962   Diastolic BP (mmHg) Diastolic BP (mmHg) 74  Mean BP 92  Pulse Ox % Pulse Ox % 90  Pulse Ox Activity Level  At rest  Oxygen Delivery Room Air/ 21 %   EXAM: Well-developed, well-nourished, in NAD. Bilateral periorbital ecchymoses. Oropharynx clear. No carotid bruits auscultated. Normal S1, S2 and regular cardiac rhythm on exam. Lungs clear to auscultation bilaterally. Abdomen soft and nontender. Peripheral pulses palpated. No clubbing, cyanosis, or edema in extremities.  MENTAL STATUS: Alert and oriented to person, place, and time. Language fluent and appropriate. Cognition and memory conversationally intact. CRANIAL NERVES: Visual fields full to confrontation. PERRL. EOMI. Facial sensation intact. Facial muscles full and symmetric. Hearing intact to finger rub. Uvula midline with symmetric palatal elevation. Tongue midline without fasciculations. MOTOR: Normal bulk and tone. Strength 5/5 in deltoids, biceps, triceps, wrist flexors and extensors, and hand intrinsics bilaterally. Strength 5/5 in iliopsoas, glutes, hamstrings, quads, and tib ant bilaterally. REFLEXES: 2+ in biceps, triceps, patella, and achilles bilaterally. Flexor plantar responses bilaterally. SENSORY: Mildly reduced to vibration in the feet bilaterally. Length-dependent loss to pinprick in a stocking distribution up to the lower leg bilaterally. Proprioception about the great toe is normal bilaterally. Romberg sign is absent. COORDINATION: No ataxia or dysmetria on finger-nose or heel-shin maneuvers. GAIT: Narrow-based, short steps, unsteady. Normal arm swing and no en bloc turning. Unable to perform tandem gait or tandem position.  Lab Results: Hepatic:  03-May-15 10:35   Bilirubin, Total 0.5  Alkaline Phosphatase 95 (45-117 NOTE: New Reference Range 03/11/13)  SGPT (ALT) 32  SGOT (AST)  44  Total Protein, Serum 7.6  Albumin, Serum 3.8  Routine Micro:  03-May-15 15:43   Micro Text Report URINE CULTURE   COMMENT                    MIXED BACTERIAL ORGANISMS   COMMENT                   RESULTS SUGGESTIVE OF CONTAMINATION   ANTIBIOTIC                       Specimen Source CLEAN CATCH  Culture Comment MIXED BACTERIAL ORGANISMS  Culture Comment . RESULTS SUGGESTIVE OF CONTAMINATION  Result(s) reported on 23 Aug 2013 at 09:23AM.  Routine Chem:  04-May-15 05:11   Glucose, Serum  153  BUN 11  Creatinine (comp) 0.96  Sodium, Serum 137  Potassium, Serum 3.6  Chloride, Serum 103  CO2, Serum 27  Calcium (Total), Serum 8.8  Anion Gap 7  Osmolality (calc) 276  eGFR (African American) >60  eGFR (Non-African American)  59 (eGFR values <24m/min/1.73 m2 may be an indication of chronic kidney disease (CKD). Calculated eGFR is useful in patients with stable renal function. The eGFR calculation will not be reliable in acutely ill patients when serum creatinine is changing rapidly. It is not useful in  patients on dialysis. The eGFR calculation may not be applicable to patients at the low and  high extremes of body sizes, pregnant women, and vegetarians.)  Cholesterol, Serum 130  Triglycerides, Serum 71  HDL (INHOUSE) 46  VLDL Cholesterol Calculated 14  LDL Cholesterol Calculated 70 (Result(s) reported on 22 Aug 2013 at 05:55AM.)  Cardiac:  03-May-15 17:43   Troponin I < 0.02 (0.00-0.05 0.05 ng/mL or less: NEGATIVE  Repeat testing in 3-6 hrs  if clinically indicated. >0.05 ng/mL: POTENTIAL  MYOCARDIAL INJURY. Repeat  testing in 3-6 hrs if  clinically indicated. NOTE: An increase or decrease  of 30% or more on serial  testing suggests a  clinically important change)  CK, Total 120 (26-192 NOTE: NEW REFERENCE RANGE  05/23/2013)  CPK-MB, Serum 1.3 (Result(s) reported on 21 Aug 2013 at 06:14PM.)  Routine UA:  03-May-15 10:35   Color (UA) Amber  Clarity (UA) Clear  Glucose (UA) Negative  Bilirubin (UA) Negative  Ketones (UA) Negative  Specific Gravity (UA) 1.021  Blood (UA) 1+  pH (UA) 5.0   Protein (UA) 30 mg/dL  Nitrite (UA) Negative  Leukocyte Esterase (UA) Negative (Result(s) reported on 21 Aug 2013 at 12:37PM.)  RBC (UA) 4 /HPF  WBC (UA) 3 /HPF  Bacteria (UA) NONE SEEN  Epithelial Cells (UA) 2 /HPF  Mucous (UA) PRESENT (Result(s) reported on 21 Aug 2013 at 12:37PM.)  Routine Hem:  04-May-15 05:11   WBC (CBC) 3.8  RBC (CBC) 5.12  Hemoglobin (CBC) 14.9  Hematocrit (CBC) 45.7  Platelet Count (CBC)  73  MCV 89  MCH 29.1  MCHC 32.5  RDW  15.2  Neutrophil % 66.7  Lymphocyte % 19.4  Monocyte % 9.7  Eosinophil % 3.3  Basophil % 0.9  Neutrophil # 2.6  Lymphocyte #  0.7  Monocyte # 0.4  Eosinophil # 0.1  Basophil # 0.0 (Result(s) reported on 22 Aug 2013 at 05:44AM.)   Radiology Results: US:    04-May-15 15:36, US Carotid Doppler Bilateral  US Carotid Doppler Bilateral   REASON FOR EXAM:    ataxia  COMMENTS:       PROCEDURE: US  - US CAROTID DOPPLER BILATERAL  - Aug 22 2013  3:36PM     CLINICAL DATA:  Ataxia, hypertension.    EXAM:  BILATERAL CAROTID DUPLEX ULTRASOUND    TECHNIQUE:  Gray scale imaging, color Doppler and duplex ultrasound was  performed of bilateral carotid and vertebral arteries in the neck.    COMPARISON:  None.  REVIEW OF SYSTEMS:  Quantification of carotid stenosis is based on velocity parameters  that correlate the residual internal carotid diameter with  NASCET-based stenosis levels, using the diameter of the distal  internal carotid lumen as the denominator for stenosis measurement.    The following velocity measurements were obtained:    PEAK SYSTOLIC/END DIASTOLIC    RIGHT    ICA:                67/15cm/sec    CCA:                     74/11cm/sec  SYSTOLIC ICA/CCA RATIO:  0.9    DIASTOLIC ICA/CCA RATIO: 1.3    ECA:                     63cm/sec    LEFT    ICA:                     73/17cm/sec    CCA:                       130/12cm/sec    SYSTOLIC ICA/CCA RATIO:  0.6    DIASTOLIC ICA/CCA RATIO:  1.4  ECA:                      62cm/sec    FINDINGS:  RIGHT CAROTID ARTERY: No significant plaque accumulation or  stenosis. Normal waveforms and color Doppler signal.    RIGHT VERTEBRAL ARTERY:  Normal flow direction and waveform.    LEFT CAROTID ARTERY: No significant plaque or stenosis. Distal ICA  tortuous. Normal waveforms and color Doppler signal.    LEFT VERTEBRAL ARTERY: Normal flow direction and waveform.     IMPRESSION:  1. No significant carotid plaque or stenosis.  Electronically Signed    By: Daniel  Hassell M.D.    On: 08/22/2013 15:43         Verified By: DAYNE D. HASSELL, M.D.,  CT:    03-May-15 12:37, CT Head Without Contrast  CT Head Without Contrast   REASON FOR EXAM:    fall pain in head  COMMENTS:       PROCEDURE: CT  - CT HEAD WITHOUT CONTRAST  - Aug 21 2013 12:37PM     CLINICAL DATA:  74-year-old female with fall, head injury, dizziness  and headache.    EXAM:  CT HEAD WITHOUT CONTRAST    TECHNIQUE:  Contiguous axial images were obtained from the base of the skull  through the vertex without intravenous contrast.  COMPARISON:  None.    FINDINGS:  Mild chronic small-vessel white matter ischemic changes are noted.    No acute intracranial abnormalities are identified, including mass  lesion or mass effect, hydrocephalus, extra-axial fluid collection,  midline shift, hemorrhage, or acute infarction.    The visualized bony calvarium is unremarkable.    Right forehead soft tissue swelling is present.     IMPRESSION:  No evidence of acute intracranial abnormality.  Right forehead soft tissue swelling without fracture.      Electronically Signed    By: Jeff  Hu M.D.    On: 08/21/2013 12:39         Verified By: JEFFREY T. HU, M.D.,   Impression/Recommendations: Recommendations:   Ms. Cassidy [name] is a 74 yo woman who presented after a fall. She recently had vertigo related to starting Ambien, though this has resolved and she has clearly been having balance  difficulties contributing to falls well before the recent changes to her psychiatric medications. Her neurologic exam shows mild-to-moderate length-dependent loss to pinprick in her feet bilaterally, though reflexes are normal and proprioception is intact. She has a remarkably normal appendicular cerebellar exam, with some mild truncal instability on standing and sitting upright. Brain CT done for trauma survey on admission shows generalized cerebral atrophy, with slightly more than expected cerebellar atrophy particularly affecting the midline vermis.  believe her gait abnormalities are multifactorial and related to heretofore unknown peripheral neuropathy as well as mild midline cerebellar dysfunction of unknown etiology. Pure ataxic SCA syndromes (spinocerebellar ataxia) are unlikely based on the clinical history and her age, and she has no Parkinsonism on exam to suggest a Parkinson's plus syndrome (though multiple systems atrophy, or MSA, is in the differential due to her autonomic issues). She denies alcohol use. I would recommend aggressive physical therapy and home safety evaluation as well as sending labs to evaluate the cause of her neuropathy.  Recommendations: - No further neuroimaging at this time. Very low suspicion for myelopathy given benign neurologic exam - Check HbA1c, Vitamin B12,   folate, RPR, thyroid panel, serum and urine protein electrophoresis, ANA, ESR to evaluate for causes of peripheral neuropathyPhysical therapy for gait trainingEnsure outpatient followup of above labs, which may not result completely during her inpatient stay you for the opportunity to participate in Ms. Reidel' care. Please page neurology consults with further questions. , MD  Electronic Signatures: ,  M (MD)  (Signed 05-May-15 10:13)  Authored: REFERRING PHYSICIAN, Consult, History of Present Illness, Review of Systems, PAST MEDICAL/SURGICAL HISTORY, HOME MEDICATIONS, ALLERGIES,  Social/Family History, NURSING VITAL SIGNS, Physical Exam-, LAB RESULTS, RADIOLOGY RESULTS, Recommendations   Last Updated: 05-May-15 10:13 by ,  M (MD) 

## 2014-08-15 DIAGNOSIS — M545 Low back pain, unspecified: Secondary | ICD-10-CM | POA: Insufficient documentation

## 2014-08-15 DIAGNOSIS — G8929 Other chronic pain: Secondary | ICD-10-CM | POA: Insufficient documentation

## 2014-08-25 DIAGNOSIS — Z0000 Encounter for general adult medical examination without abnormal findings: Secondary | ICD-10-CM | POA: Insufficient documentation

## 2014-10-18 ENCOUNTER — Emergency Department
Admission: EM | Admit: 2014-10-18 | Discharge: 2014-10-18 | Disposition: A | Discharge status: Home / Self Care | Payer: Medicare Other | Attending: Emergency Medicine | Admitting: Emergency Medicine

## 2014-10-18 ENCOUNTER — Encounter: Payer: Self-pay | Admitting: Emergency Medicine

## 2014-10-18 ENCOUNTER — Other Ambulatory Visit: Payer: Self-pay

## 2014-10-18 ENCOUNTER — Emergency Department: Payer: Medicare Other

## 2014-10-18 DIAGNOSIS — R079 Chest pain, unspecified: Secondary | ICD-10-CM

## 2014-10-18 DIAGNOSIS — R091 Pleurisy: Secondary | ICD-10-CM | POA: Diagnosis present

## 2014-10-18 DIAGNOSIS — R0789 Other chest pain: Secondary | ICD-10-CM | POA: Diagnosis not present

## 2014-10-18 HISTORY — DX: Disorder of thyroid, unspecified: E07.9

## 2014-10-18 LAB — CBC WITH DIFFERENTIAL/PLATELET
Basophils Absolute: 0 10*3/uL (ref 0–0.1)
EOS ABS: 0.1 10*3/uL (ref 0–0.7)
HEMATOCRIT: 42.6 % (ref 35.0–47.0)
Hemoglobin: 13.9 g/dL (ref 12.0–16.0)
Lymphocytes Relative: 17 %
Lymphs Abs: 0.7 10*3/uL — ABNORMAL LOW (ref 1.0–3.6)
MCH: 31.2 pg (ref 26.0–34.0)
MCHC: 32.7 g/dL (ref 32.0–36.0)
MCV: 95.1 fL (ref 80.0–100.0)
Monocytes Absolute: 0.3 10*3/uL (ref 0.2–0.9)
Monocytes Relative: 8 %
Neutro Abs: 2.8 10*3/uL (ref 1.4–6.5)
Neutrophils Relative %: 72 %
Platelets: 62 10*3/uL — ABNORMAL LOW (ref 150–440)
RBC: 4.48 MIL/uL (ref 3.80–5.20)
RDW: 14.9 % — ABNORMAL HIGH (ref 11.5–14.5)
WBC: 3.9 10*3/uL (ref 3.6–11.0)

## 2014-10-18 LAB — BASIC METABOLIC PANEL
Anion gap: 6 (ref 5–15)
BUN: 13 mg/dL (ref 6–20)
CALCIUM: 9 mg/dL (ref 8.9–10.3)
CO2: 25 mmol/L (ref 22–32)
CREATININE: 0.81 mg/dL (ref 0.44–1.00)
Chloride: 110 mmol/L (ref 101–111)
GLUCOSE: 119 mg/dL — AB (ref 65–99)
Potassium: 4.1 mmol/L (ref 3.5–5.1)
Sodium: 141 mmol/L (ref 135–145)

## 2014-10-18 LAB — TROPONIN I: Troponin I: <0.03 ng/mL (ref <0.031)

## 2014-10-18 MED ORDER — NITROGLYCERIN 0.4 MG SL SUBL
0.4000 mg | SUBLINGUAL_TABLET | SUBLINGUAL | Status: DC | PRN
  Administered 2014-10-18: 0.4 mg via SUBLINGUAL

## 2014-10-18 MED ORDER — SODIUM CHLORIDE 0.9 % IV BOLUS (SEPSIS)
1000.0000 mL | Freq: Once | INTRAVENOUS | Status: AC
  Administered 2014-10-18: 1000 mL via INTRAVENOUS

## 2014-10-18 MED ORDER — NITROGLYCERIN 0.4 MG SL SUBL
SUBLINGUAL_TABLET | SUBLINGUAL | Status: AC
  Administered 2014-10-18: 0.4 mg via SUBLINGUAL
  Filled 2014-10-18: qty 1

## 2014-10-18 NOTE — ED Provider Notes (Signed)
[facility] Emergency Department Provider Note  ____________________________________________  Time seen: 11:20 AM  I have reviewed the triage vital signs and the nursing notes.   HISTORY  Chief Complaint Pleurisy    HPI Cassidy [name] is a 74 y.o. female who complains of right-sided chest pressure that started around 9:00 AM today.The pain is been constant since then. No recent falls or injuries. She was at rest when it started. It is not pleuritic nor exertional. It is nonradiating, no shortness of breath, no diaphoresis, no vomiting. It is not pleuritic or positional. It is moderate in intensity.     Past Medical History  Diagnosis Date  . Thyroid disease     There are no active problems to display for this patient.   No past surgical history on file.  No current outpatient prescriptions on file.  Allergies Review of patient's allergies indicates no known allergies.  No family history on file.  Social History History  Substance Use Topics  . Smoking status: Never Smoker   . Smokeless tobacco: Never Used  . Alcohol Use: No    Review of Systems  Constitutional: No fever or chills. No weight changes Eyes:No blurry vision or double vision.  ENT: No sore throat. Cardiovascular: Chest pain as above. Respiratory: No dyspnea or cough. Gastrointestinal: Negative for abdominal pain, vomiting and diarrhea.  No BRBPR or melena. Genitourinary: Negative for dysuria, urinary retention, bloody urine, or difficulty urinating. Musculoskeletal: Negative for back pain. No joint swelling or pain. Skin: Negative for rash. Neurological: Negative for headaches, focal weakness or numbness. Psychiatric:No anxiety or depression.   Endocrine:No hot/cold intolerance, changes in energy, or sleep difficulty.  10-point ROS otherwise negative.  ____________________________________________   PHYSICAL EXAM:  VITAL SIGNS: ED Triage Vitals  Enc Vitals Group      BP 10/18/14 1126 120/53 mmHg     Pulse Rate 10/18/14 1126 81     Resp 10/18/14 1126 37     Temp 10/18/14 1126 98.2 F (36.8 C)     Temp Source 10/18/14 1126 Oral     SpO2 10/18/14 1126 96 %     Weight 10/18/14 1141 212 lb (96.163 kg)     Height 10/18/14 1141 5' (1.524 m)     Head Cir --      Peak Flow --      Pain Score --      Pain Loc --      Pain Edu? --      Excl. in [place]? --      Constitutional: Alert and oriented. Well appearing and in no distress. Eyes: No scleral icterus. No conjunctival pallor. PERRL. EOMI ENT   Head: Normocephalic and atraumatic.   Nose: No congestion/rhinnorhea. No septal hematoma   Mouth/Throat: MMM, no pharyngeal erythema. No peritonsillar mass. No uvula shift.   Neck: No stridor. No SubQ emphysema. No meningismus. Hematological/Lymphatic/Immunilogical: No cervical lymphadenopathy. Cardiovascular: RRR. Normal and symmetric distal pulses are present in all extremities. No murmurs, rubs, or gallops. Respiratory: Normal respiratory effort without tachypnea nor retractions. Breath sounds are clear and equal bilaterally. No wheezes/rales/rhonchi. Gastrointestinal: Soft and nontender. No distention. There is no CVA tenderness.  No rebound, rigidity, or guarding. Genitourinary: deferred Musculoskeletal: Nontender with normal range of motion in all extremities. No joint effusions.  No lower extremity tenderness.  No edema. Neurologic:   Normal speech and language.  CN 2-10 normal. Motor grossly intact. No pronator drift.  Normal gait. No gross focal neurologic deficits are appreciated.  Skin:  Skin is warm, dry and intact. No rash noted.  No petechiae, purpura, or bullae. Psychiatric: Mood and affect are normal. Speech and behavior are normal. Patient exhibits appropriate insight and judgment.  ____________________________________________    LABS (pertinent positives/negatives) (all labs ordered are listed, but only abnormal results are  displayed) Labs Reviewed  BASIC METABOLIC PANEL - Abnormal; Notable for the following:    Glucose, Bld 119 (*)    All other components within normal limits  CBC WITH DIFFERENTIAL/PLATELET - Abnormal; Notable for the following:    RDW 14.9 (*)    Platelets 62 (*)    Lymphs Abs 0.7 (*)    All other components within normal limits  TROPONIN I   ____________________________________________   EKG  Interpreted by me Normal sinus rhythm rate of 78. Normal axis and intervals.  Q waves in 3 and aVF. Normal ST segments and T waves.  ____________________________________________    RADIOLOGY  Chest x-ray unremarkable  ____________________________________________   PROCEDURES  ____________________________________________   INITIAL IMPRESSION / ASSESSMENT AND PLAN / ED COURSE  Pertinent labs & imaging results that were available during my care of the patient were reviewed by me and considered in my medical decision making (see chart for details).  Symptoms nonspecific. Low suspicion for PE pneumothorax TAD ACS pneumonia carditis or mediastinitis. No evidence of fracture or dislocation or soft tissue infection. We'll check a chest x-ray and labs, give the patient aspirin and nitroglycerin, and reassess.  ----------------------------------------- 3:30 PM on 10/18/2014 -----------------------------------------  Symptoms resolved. Patient has remained hemodynamically stable with unremarkable vital signs in the ED. Her workup is unremarkable. Heart score is low risk. I will discharge her home and have her follow up with primary care later this week for further monitoring of her symptoms and referral to cardiology.  ____________________________________________   FINAL CLINICAL IMPRESSION(S) / ED DIAGNOSES  Final diagnoses:  Chest pain, unspecified chest pain type      Carrie Mew, MD 10/18/14 1530

## 2014-10-18 NOTE — ED Notes (Signed)
Brought to ed via ems from home for evaluation of chest pressure, starting this am @ 10:00am. Received A&O*3 speaking full sentences, denies pain, complaining of Chest pressure and SOB radiating to her back. Awaiting further evaluation.

## 2014-10-18 NOTE — Discharge Instructions (Signed)

## 2014-11-27 ENCOUNTER — Encounter: Payer: Self-pay | Admitting: Psychiatry

## 2014-11-27 ENCOUNTER — Ambulatory Visit (INDEPENDENT_AMBULATORY_CARE_PROVIDER_SITE_OTHER): Payer: Medicare Other | Admitting: Psychiatry

## 2014-11-27 VITALS — BP 122/72 | HR 95 | Temp 99.2°F | Ht 60.0 in | Wt 212.0 lb

## 2014-11-27 DIAGNOSIS — F411 Generalized anxiety disorder: Secondary | ICD-10-CM

## 2014-11-27 DIAGNOSIS — F331 Major depressive disorder, recurrent, moderate: Secondary | ICD-10-CM | POA: Diagnosis not present

## 2014-11-27 DIAGNOSIS — M1990 Unspecified osteoarthritis, unspecified site: Secondary | ICD-10-CM | POA: Insufficient documentation

## 2014-11-27 DIAGNOSIS — K219 Gastro-esophageal reflux disease without esophagitis: Secondary | ICD-10-CM | POA: Insufficient documentation

## 2014-11-27 DIAGNOSIS — G4700 Insomnia, unspecified: Secondary | ICD-10-CM

## 2014-11-27 DIAGNOSIS — F3341 Major depressive disorder, recurrent, in partial remission: Secondary | ICD-10-CM | POA: Insufficient documentation

## 2014-11-27 DIAGNOSIS — D649 Anemia, unspecified: Secondary | ICD-10-CM | POA: Insufficient documentation

## 2014-11-27 MED ORDER — TRAZODONE HCL 150 MG PO TABS: 150.0000 mg | ORAL_TABLET | Freq: Every day | ORAL | Status: DC

## 2014-11-27 NOTE — Progress Notes (Signed)
BH MD/PA/NP OP Progress Note  11/27/2014 2:42 PM Cassidy [name]  MRN:  [id number]  Subjective:  Patient returns for follow-up of her major depressive disorder, generalized anxiety disorder, panic disorder and insomnia. She returns with her youngest sister. She states overall things are going well on her medication regimen. She typically was following up with Dr. Mamie Nick every 6 months. Patient has no complaints on the current regimen. She states that she gets up cleans her house. She states other days she'll attend appointments. She states that she feels good every morning when she gets up. In regards to side effects she does not report any. The patient does have an unsteady gait and uses a walker. However sister indicates that secondary to back problems. I did discuss the issue with any orthostatic blood pressure changes related trazodone. It appears there were some attempts in the past to change the patient other medications but that cause problems. Chief Complaint:  Visit Diagnosis:  No diagnosis found.  Past Medical History:  Past Medical History  Diagnosis Date  . Thyroid disease   . Anxiety   . Depression     Past Surgical History  Procedure Laterality Date  . Tonsillectomy    . Appendectomy     Family History:  Family History  Problem Relation Age of Onset  . Diabetes Mother   . COPD Mother   . Kidney disease Mother   . Depression Mother   . Heart attack Father   . Aneurysm Father   . Anxiety disorder Sister   . Depression Sister   . Diabetes Sister   . Hypertension Sister   . Atrial fibrillation Brother   . Spinal muscular atrophy Brother   . Breast cancer Sister   . Bone cancer Sister   . Depression Sister   . Anxiety disorder Sister   . Breast cancer Sister   . Colon cancer Sister   . Depression Sister   . Diabetes Sister   . COPD Sister   . Depression Sister   . Anxiety disorder Sister   . Diabetes Brother   . Depression Brother    Social History:  History    Social History  . Marital Status: Widowed    Spouse Name: N/A  . Number of Children: N/A  . Years of Education: N/A   Social History Main Topics  . Smoking status: Never Smoker   . Smokeless tobacco: Never Used  . Alcohol Use: No  . Drug Use: No  . Sexual Activity: Not Currently    Birth Control/ Protection: Post-menopausal   Other Topics Concern  . None   Social History Narrative   Additional History:   Assessment:   Musculoskeletal: Strength & Muscle Tone: decreased Gait & Station: unsteady walks with a walker Patient leans: N/A  Psychiatric Specialty Exam: HPI  Review of Systems  Psychiatric/Behavioral: Negative for depression, suicidal ideas, hallucinations, memory loss and substance abuse. The patient is not nervous/anxious and does not have insomnia.     Blood pressure 122/72, pulse 95, temperature 99.2 F (37.3 C), temperature source Tympanic, height 5' (1.524 m), weight 212 lb (96.163 kg), SpO2 92 %.Body mass index is 41.4 kg/(m^2).  General Appearance: Well Groomed  Eye Contact:  Good  Speech:  Clear and Coherent  Volume:  Normal  Mood:  I feel good  Affect:  Appropriate and Congruent  Thought Process:  Linear  Orientation:  Full (Time, Place, and Person)  Thought Content:  Negative  Suicidal Thoughts:  No  Homicidal Thoughts:  No  Memory:  Immediate;   Good Recent;   Good Remote;   Good  Judgement:  Negative  Insight:  Good  Psychomotor Activity:  Negative  Concentration:  Good  Recall:  Good  Fund of Knowledge: Good  Language: Good  Akathisia:  Negative  Handed:  Right unknown   AIMS (if indicated):   Done today, normal  Assets:  Communication Skills Desire for Improvement Social Support  ADL's:  Intact  Cognition: WNL  Sleep:  good   Is the patient at risk to self?  No. Has the patient been a risk to self in the past 6 months?  No. Has the patient been a risk to self within the distant past?  No. Is the patient a risk to others?   No. Has the patient been a risk to others in the past 6 months?  No. Has the patient been a risk to others within the distant past?  No.  Current Medications: Current Outpatient Prescriptions  Medication Sig Dispense Refill  . aspirin 81 MG tablet Take 81 mg by mouth daily.    . busPIRone (BUSPAR) 15 MG tablet Take 15 mg by mouth 2 (two) times daily.  5  . escitalopram (LEXAPRO) 20 MG tablet Take 20 mg by mouth daily.  5  . ferrous fumarate-iron polysaccharide complex (TANDEM) 162-115.2 MG CAPS Take by mouth.    . fluticasone (FLONASE) 50 MCG/ACT nasal spray PLACE 2 SPRAYS INTO BOTH NOSTRILS 2 (TWO) TIMES DAILY.  5  . levothyroxine (SYNTHROID, LEVOTHROID) 75 MCG tablet Take 75 mcg by mouth daily.  3  . omeprazole (PRILOSEC) 20 MG capsule Take 20 mg by mouth 2 (two) times daily.  5  . QUEtiapine (SEROQUEL) 400 MG tablet Take 400 mg by mouth at bedtime.  6  . traZODone (DESYREL) 150 MG tablet Take 1 tablet (150 mg total) by mouth at bedtime. 30 tablet 5   No current facility-administered medications for this visit.    Medical Decision Making:  Review of Medication Regimen & Side Effects (2)  Treatment Plan Summary:Medication management and Plan Patient has been stable on this medication for several months. We will discontinue her on her BuSpar 15 mg twice daily, quetiapine forge milligrams at bedtime, trazodone 150 mg at bedtime and Lexapro 20 mg daily. Patient is artery been provided with several refills of these medications. She only needs a refill of the trazodone to get her to her next appointment. We will follow up in 3 months. They've been encouraged calling questions or concerns prior to next point.   [name] 11/27/2014, 2:42 PM

## 2014-12-08 ENCOUNTER — Encounter: Payer: Self-pay | Admitting: Emergency Medicine

## 2014-12-08 ENCOUNTER — Emergency Department: Payer: Medicare Other

## 2014-12-08 ENCOUNTER — Other Ambulatory Visit: Payer: Self-pay

## 2014-12-08 ENCOUNTER — Emergency Department
Admission: EM | Admit: 2014-12-08 | Discharge: 2014-12-08 | Disposition: A | Discharge status: Home / Self Care | Payer: Medicare Other | Attending: Emergency Medicine | Admitting: Emergency Medicine

## 2014-12-08 DIAGNOSIS — Z79899 Other long term (current) drug therapy: Secondary | ICD-10-CM | POA: Diagnosis not present

## 2014-12-08 DIAGNOSIS — Z7982 Long term (current) use of aspirin: Secondary | ICD-10-CM | POA: Diagnosis not present

## 2014-12-08 DIAGNOSIS — R1310 Dysphagia, unspecified: Secondary | ICD-10-CM

## 2014-12-08 DIAGNOSIS — Z7951 Long term (current) use of inhaled steroids: Secondary | ICD-10-CM | POA: Insufficient documentation

## 2014-12-08 LAB — CBC
HCT: 50.2 % — ABNORMAL HIGH (ref 35.0–47.0)
Hemoglobin: 16.4 g/dL — ABNORMAL HIGH (ref 12.0–16.0)
MCH: 30.6 pg (ref 26.0–34.0)
MCHC: 32.6 g/dL (ref 32.0–36.0)
MCV: 93.8 fL (ref 80.0–100.0)
PLATELETS: 72 10*3/uL — AB (ref 150–440)
RBC: 5.36 MIL/uL — AB (ref 3.80–5.20)
RDW: 14.7 % — AB (ref 11.5–14.5)
WBC: 4.1 10*3/uL (ref 3.6–11.0)

## 2014-12-08 LAB — BASIC METABOLIC PANEL
Anion gap: 8 (ref 5–15)
BUN: 12 mg/dL (ref 6–20)
CALCIUM: 9.3 mg/dL (ref 8.9–10.3)
CO2: 27 mmol/L (ref 22–32)
CREATININE: 1.04 mg/dL — AB (ref 0.44–1.00)
Chloride: 104 mmol/L (ref 101–111)
GFR calc Af Amer: 60 mL/min — ABNORMAL LOW (ref >60)
GFR calc non Af Amer: 52 mL/min — ABNORMAL LOW (ref >60)
Glucose, Bld: 108 mg/dL — ABNORMAL HIGH (ref 65–99)
Potassium: 3.9 mmol/L (ref 3.5–5.1)
SODIUM: 139 mmol/L (ref 135–145)

## 2014-12-08 LAB — TROPONIN I

## 2014-12-08 NOTE — ED Notes (Signed)
Pt talking, asking for blanket, appears in no distress.

## 2014-12-08 NOTE — ED Notes (Signed)
Pt states she is feeling pressure in her chest which is new pain for her. Ekg to be obtained.

## 2014-12-08 NOTE — ED Notes (Signed)
Patient with no complaints at this time. Respirations even and unlabored. Skin warm/dry. Discharge instructions reviewed with patient at this time. Patient given opportunity to voice concerns/ask questions. Patient discharged at this time and left Emergency Department, via wheelchair.

## 2014-12-08 NOTE — ED Notes (Signed)
Pt awoke with difficulty swallowing liquids, pt speaking in complete sentences , no respiratory distress, no drooling noted. "if the liquid stay down epigastric burning pain occurs till I make myself throw up"

## 2014-12-08 NOTE — ED Provider Notes (Signed)
[facility] Emergency Department Provider Note   ____________________________________________  Time seen: 8 PM I have reviewed the triage vital signs and the triage nursing note.  HISTORY  Chief Complaint Dysphagia   Historian Patient  HPI Cassidy [name] is a 74 y.o. female who has a history of esophageal stricture, status post multiple stretching, esophageal spasticity, who is presenting that since this morning she's had trouble keeping down any food or liquids. She has no known food bolus got stuck. She had trouble drinking milk this morning and every time she drank she felt burning and then she stuck her finger in her mouth the cause or soft or throw up which created some relief. No additional chest pain or palpitations or trouble breathing, or nausea. No diarrhea. She is very anxious to try drinking again after this episode all morning. She follows with Dr. Vira Agar, gastroenterologist.   Past Medical History  Diagnosis Date  . Thyroid disease   . Anxiety   . Depression     Patient Active Problem List   Diagnosis Date Noted  . Absolute anemia 11/27/2014  . Arthritis 11/27/2014  . Acid reflux 11/27/2014  . Depression, major, recurrent, in partial remission 11/27/2014  . Encounter for general adult medical examination without abnormal findings 08/25/2014  . Chronic LBP 08/15/2014  . Complete rotator cuff rupture of left shoulder 05/01/2014  . Infraspinatus tenosynovitis 05/01/2014  . Difficulty in walking 11/30/2013  . Extreme obesity 11/30/2013  . Arthritis, degenerative 10/01/2013  . Steatohepatitis 10/01/2013  . Orthostasis 10/01/2013  . 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 Fall 09/26/2013  . Dizziness 09/07/2013  . Osteoporosis with fracture 09/06/2013  . Clinical depression 03/25/2012  . Adult hypothyroidism 03/25/2012  . Esophageal stenosis 07/03/2009  . Back ache 03/08/2003  . Anxiety state 12/27/2002    Past Surgical History   Procedure Laterality Date  . Tonsillectomy    . Appendectomy      Current Outpatient Rx  Name  Route  Sig  Dispense  Refill  . aspirin EC 81 MG tablet   Oral   Take 81 mg by mouth at bedtime.         . busPIRone (BUSPAR) 15 MG tablet   Oral   Take 15 mg by mouth 2 (two) times daily.         Marland Kitchen escitalopram (LEXAPRO) 20 MG tablet   Oral   Take 20 mg by mouth at bedtime.         . ferrous fumarate-iron polysaccharide complex (TANDEM) 162-115.2 MG CAPS   Oral   Take 1 capsule by mouth at bedtime.         . fluticasone (FLONASE) 50 MCG/ACT nasal spray   Each Nare   Place 1 spray into both nostrils daily.         Marland Kitchen levothyroxine (SYNTHROID, LEVOTHROID) 75 MCG tablet   Oral   Take 75 mcg by mouth daily before breakfast.         . omeprazole (PRILOSEC) 20 MG capsule   Oral   Take 20 mg by mouth 2 (two) times daily.         . QUEtiapine (SEROQUEL) 400 MG tablet   Oral   Take 400 mg by mouth at bedtime.         . traZODone (DESYREL) 150 MG tablet   Oral   Take 1 tablet (150 mg total) by mouth at bedtime.   30 tablet   5     Allergies Sulfa antibiotics  Family History  Problem Relation Age of Onset  . Diabetes Mother   . COPD Mother   . Kidney disease Mother   . Depression Mother   . Heart attack Father   . Aneurysm Father   . Anxiety disorder Sister   . Depression Sister   . Diabetes Sister   . Hypertension Sister   . Atrial fibrillation Brother   . Spinal muscular atrophy Brother   . Breast cancer Sister   . Bone cancer Sister   . Depression Sister   . Anxiety disorder Sister   . Breast cancer Sister   . Colon cancer Sister   . Depression Sister   . Diabetes Sister   . COPD Sister   . Depression Sister   . Anxiety disorder Sister   . Diabetes Brother   . Depression Brother     Social History Social History  Substance Use Topics  . Smoking status: Never Smoker   . Smokeless tobacco: Never Used  . Alcohol Use: No    Review  of Systems  Constitutional: Negative for fever. Eyes: Negative for visual changes. ENT: Negative for sore throat. Cardiovascular: Epigastric burning when she drink some fluid. No palpitations. Respiratory: Negative for shortness of breath. Gastrointestinal: Negative for abdominal pain, vomiting and diarrhea. Genitourinary: Negative for dysuria. Musculoskeletal: Negative for back pain. Skin: Negative for rash. Neurological: Negative for headaches, focal weakness or numbness. 10 point Review of Systems otherwise negative ____________________________________________   PHYSICAL EXAM:  VITAL SIGNS: ED Triage Vitals  Enc Vitals Group     BP 12/08/14 1459 130/71 mmHg     Pulse Rate 12/08/14 1459 90     Resp 12/08/14 1459 18     Temp 12/08/14 1459 98.7 F (37.1 C)     Temp Source 12/08/14 1459 Oral     SpO2 12/08/14 1459 93 %     Weight 12/08/14 1459 200 lb (90.719 kg)     Height 12/08/14 1459 5' (1.524 m)     Head Cir --      Peak Flow --      Pain Score 12/08/14 1500 0     Pain Loc --      Pain Edu? --      Excl. in [place]? --      Constitutional: Alert and oriented. Well appearing and in no distress. Slightly anxious. Eyes: Conjunctivae are normal. PERRL. Normal extraocular movements. ENT   Head: Normocephalic and atraumatic.   Nose: No congestion/rhinnorhea.   Mouth/Throat: Mucous membranes are moist.   Neck: No stridor. Cardiovascular/Chest: Normal rate, regular rhythm.  No murmurs, rubs, or gallops. Respiratory: Normal respiratory effort without tachypnea nor retractions. Breath sounds are clear and equal bilaterally. No wheezes/rales/rhonchi. Gastrointestinal: Soft. No distention, no guarding, no rebound. Nontender . Obese  Genitourinary/rectal:Deferred Musculoskeletal: Nontender with normal range of motion in all extremities. No joint effusions.  No lower extremity tenderness nor edema. Neurologic:  Normal speech and language. No gross or focal neurologic  deficits are appreciated. Skin:  Skin is warm, dry and intact. No rash noted. Psychiatric: Mood and affect are normal. Speech and behavior are normal. Patient exhibits appropriate insight and judgment.  ____________________________________________   EKG I, [name] Roca, MD, the attending physician have personally viewed and interpreted all ECGs.  89 bpm. Narrow QS. Normal axis. Normal ST and T-wave. ____________________________________________  LABS (pertinent positives/negatives)  Basic metabolic panel without significant abnormality. Creatinine 1.04 CBC showing white blood count 4.1, hemoglobin 16.4, platelet count 72 Troponin less than 0.03  ____________________________________________  RADIOLOGY All Xrays were viewed by me. Imaging interpreted by Radiologist.  Soft tissue neck: Negative __________________________________________  PROCEDURES  Procedure(s) performed: None Critical Care performed: None  ____________________________________________   ED COURSE / ASSESSMENT AND PLAN  CONSULTATIONS: None  Pertinent labs & imaging results that were available during my care of the patient were reviewed by me and considered in my medical decision making (see chart for details).   No known food bolus. I asked the patient to try drinking water at the bedside and she was able to do this with a little bit of itching and a little bit of burning, which subsided quickly. She was unable to take milk without problem. Then she was able to eat a small cup of applesauce without problem. She is anxious about taking her pills tonight. I've asked her to go ahead and crushed the ones which are crushable. Trazodone is the only one which is non-crushable, and she will either try taking at home or just give it tonight. I've asked to follow-up with her gastrologist Dr. Vira Agar.  Patient / Family / Caregiver informed of clinical course, medical decision-making process, and agree with plan.   I  discussed return precautions, follow-up instructions, and discharged instructions with patient and/or family.  ___________________________________________   FINAL CLINICAL IMPRESSION(S) / ED DIAGNOSES   Final diagnoses:  Dysphagia    FOLLOW UP  Referred to: Dr. Vira Agar, next available, call Monday.   [name] Roca, MD 12/08/14 2110

## 2014-12-08 NOTE — Discharge Instructions (Signed)
You were evaluated after trouble swallowing today, however your able to swallow liquids and take applesauce here in the emergency department. I suspect her symptoms were due to esophageal spasm, which is we have been diagnosed within the past.  Return to the emergency department for any new or worsening condition including trouble passing liquids, chest pain, trouble breathing, or any other symptoms concerning to you. I recommend following up with your gastroenterologist, Dr. Vira Agar, call on Monday to make a next available appointment.  Esophageal Spasm Esophageal spasm is an uncoordinated contraction of the muscles of the esophagus (the tube which carries food from your mouth to your stomach). Normally, the muscles of the esophagus alternate between contraction and relaxation starting from the top of the esophagus and working down to the bottom. This moves the food from the mouth to the stomach. In esophageal spasm, all the muscles contract at once. This causes pain and fails to move the food along. As a result, you may have trouble swallowing.  Women are more likely than men to have esophageal spasm. The cause of the spasms is not known. Sometimes eating hot or cold foods triggers the condition and this may be due to an overly sensitive esophagus. This is not an infectious disease and cannot be passed to others. SYMPTOMS  Symptoms of esophageal spasm may include: chest pain, burning or pain with swallowing, and difficulty swallowing.  DIAGNOSIS  Esophageal spasm can be diagnosed by a test called manometry (pressure studies of the esophagus). In this test, a special tube is inserted down the esophagus. The tube measures the muscle activity of the esophagus. Abnormal contractions mixed with normal movement helps confirm the diagnosis.  A person with a hypersensitive esophagus may be diagnosed by inflating a long balloon in the person's esophagus. If this causes the same symptoms, preventive methods may  work. PREVENTION  Avoid hot or cold foods if that seems to be a trigger. PROGNOSIS  This condition does not go away, nor is treatment entirely satisfactory. Patients need to be careful of what they eat. They need to continue on medication if a useful one is found. Fortunately, the condition does not get progressively worse as time passes. Esophageal spasm does not usually lead to more serious problems but sometimes the pain can be disabling. If a person becomes afraid to eat they may become malnourished and lose weight.  TREATMENT   A procedure in which instruments of increasing size are inserted through the esophagus to enlarge (dilate) it are used.  Medications that decrease acid-production of the stomach may be used such as proton-pump inhibitors or H2-blockers.  Medications of several types can be used to relax the muscles of the esophagus.  An individual with a hypersensitive esophagus sometimes improves with low doses of medications normally used for depression.  No treatment for esophageal spasm is effective for everyone. Often several approaches will be tried before one works. In many cases, the symptoms will improve, but will not go away completely.  For severe cases, relief is obtained two-thirds of the time by cutting the muscles along the entire length of the esophagus. This is a major surgical procedure.  Your symptoms are usually the best guide to how well the treatment for esophageal spasm works. SIDE EFFECTS OF TREATMENTS  Nitrates can cause headaches and low blood pressure.  Calcium channel blockers can cause:  Feeling sick to your stomach (nausea).  Constipation and other side effects.  Antidepressants can cause side effects that depend on the medication  used. [place]   Let your caregiver know if problems are getting worse, or if you get food stuck in your esophagus for longer than 1 hour or as directed and are unable to swallow liquid.  Take  medications as directed and with permission of your caregiver. Ask about what to do if a medication seems to get stuck in your esophagus. Only take over-the-counter or prescription medicines for pain, discomfort, or fever as directed by your caregiver.  Soft and liquid foods pass more easily than solid pieces. SEEK IMMEDIATE MEDICAL CARE IF:   You develop severe chest pain, especially if the pain is crushing or pressure-like and spreads to the arms, back, neck, or jaw, or if you have sweating, nausea, or shortness of breath. THIS COULD BE AN EMERGENCY. Do not wait to see if the pain will go away. Get medical help at once. Call 911 or 0 (operator). DO NOT drive yourself to the hospital.  Your chest pain gets worse and does not go away with rest.  You have an attack of chest pain lasting longer than usual despite rest and treatment with the medications your physician has prescribed.  You wake from sleep with chest pain or shortness of breath.  You feel dizzy or faint.  You have chest pain, not typical of your usual pain, caused by your esophagus for which you originally saw your caregiver. MAKE SURE YOU:   Understand these instructions.  Will watch your condition.  Will get help right away if you are not doing well or get worse. Document Released: 06/28/2002 Document Revised: 06/30/2011 Document Reviewed: 07/01/2013 [facility] Patient Information 2015 [place], [place]. This information is not intended to replace advice given to you by your health care provider. Make sure you discuss any questions you have with your health care provider.

## 2014-12-14 ENCOUNTER — Other Ambulatory Visit: Payer: Self-pay | Admitting: Physical Medicine and Rehabilitation

## 2014-12-14 DIAGNOSIS — M5416 Radiculopathy, lumbar region: Secondary | ICD-10-CM

## 2014-12-19 ENCOUNTER — Encounter: Payer: Self-pay | Admitting: *Deleted

## 2014-12-20 ENCOUNTER — Ambulatory Visit: Payer: Medicare Other | Admitting: Certified Registered"

## 2014-12-20 ENCOUNTER — Encounter
Admission: RE | Disposition: A | Discharge status: Home Health | Payer: Self-pay | Source: Ambulatory Visit | Attending: Unknown Physician Specialty

## 2014-12-20 ENCOUNTER — Encounter: Payer: Self-pay | Admitting: *Deleted

## 2014-12-20 ENCOUNTER — Ambulatory Visit
Admission: RE | Admit: 2014-12-20 | Discharge: 2014-12-20 | Disposition: A | Discharge status: Home Health | Payer: Medicare Other | Source: Ambulatory Visit | Attending: Unknown Physician Specialty | Admitting: Unknown Physician Specialty

## 2014-12-20 DIAGNOSIS — E669 Obesity, unspecified: Secondary | ICD-10-CM | POA: Diagnosis not present

## 2014-12-20 DIAGNOSIS — Z7982 Long term (current) use of aspirin: Secondary | ICD-10-CM | POA: Diagnosis not present

## 2014-12-20 DIAGNOSIS — E039 Hypothyroidism, unspecified: Secondary | ICD-10-CM | POA: Diagnosis not present

## 2014-12-20 DIAGNOSIS — K2950 Unspecified chronic gastritis without bleeding: Secondary | ICD-10-CM | POA: Insufficient documentation

## 2014-12-20 DIAGNOSIS — F419 Anxiety disorder, unspecified: Secondary | ICD-10-CM | POA: Insufficient documentation

## 2014-12-20 DIAGNOSIS — Z882 Allergy status to sulfonamides status: Secondary | ICD-10-CM | POA: Insufficient documentation

## 2014-12-20 DIAGNOSIS — R1310 Dysphagia, unspecified: Secondary | ICD-10-CM | POA: Diagnosis present

## 2014-12-20 DIAGNOSIS — K222 Esophageal obstruction: Secondary | ICD-10-CM | POA: Insufficient documentation

## 2014-12-20 DIAGNOSIS — Z79899 Other long term (current) drug therapy: Secondary | ICD-10-CM | POA: Insufficient documentation

## 2014-12-20 DIAGNOSIS — F329 Major depressive disorder, single episode, unspecified: Secondary | ICD-10-CM | POA: Diagnosis not present

## 2014-12-20 DIAGNOSIS — K219 Gastro-esophageal reflux disease without esophagitis: Secondary | ICD-10-CM | POA: Diagnosis not present

## 2014-12-20 HISTORY — DX: Hypothyroidism, unspecified: E03.9

## 2014-12-20 HISTORY — PX: ESOPHAGOGASTRODUODENOSCOPY (EGD) WITH PROPOFOL: SHX5813

## 2014-12-20 HISTORY — DX: Fatty (change of) liver, not elsewhere classified: K76.0

## 2014-12-20 HISTORY — DX: Obesity, unspecified: E66.9

## 2014-12-20 HISTORY — DX: Gastro-esophageal reflux disease without esophagitis: K21.9

## 2014-12-20 HISTORY — PX: SAVORY DILATION: SHX5439

## 2014-12-20 SURGERY — ESOPHAGOGASTRODUODENOSCOPY (EGD) WITH PROPOFOL
Anesthesia: General

## 2014-12-20 MED ORDER — SODIUM CHLORIDE 0.9 % IV SOLN
INTRAVENOUS | Status: DC
  Administered 2014-12-20: 1000 mL via INTRAVENOUS

## 2014-12-20 MED ORDER — LIDOCAINE HCL (CARDIAC) 20 MG/ML IV SOLN
INTRAVENOUS | Status: DC | PRN
  Administered 2014-12-20: 50 mg via INTRAVENOUS

## 2014-12-20 MED ORDER — MIDAZOLAM HCL 5 MG/5ML IJ SOLN
INTRAMUSCULAR | Status: DC | PRN
  Administered 2014-12-20: 1 mg via INTRAVENOUS

## 2014-12-20 MED ORDER — PROPOFOL INFUSION 10 MG/ML OPTIME
INTRAVENOUS | Status: DC | PRN
  Administered 2014-12-20: 120 ug/kg/min via INTRAVENOUS

## 2014-12-20 MED ORDER — GLYCOPYRROLATE 0.2 MG/ML IJ SOLN
INTRAMUSCULAR | Status: DC | PRN
  Administered 2014-12-20: 0.2 mg via INTRAVENOUS

## 2014-12-20 MED ORDER — PROPOFOL 10 MG/ML IV BOLUS
INTRAVENOUS | Status: DC | PRN
  Administered 2014-12-20: 20 mg via INTRAVENOUS
  Administered 2014-12-20: 50 mg via INTRAVENOUS

## 2014-12-20 NOTE — Anesthesia Preprocedure Evaluation (Addendum)
Anesthesia Evaluation  Patient identified by MRN, date of birth, ID band Patient awake    Reviewed: Allergy & Precautions, NPO status , Patient's Chart, lab work & pertinent test results  Airway Mallampati: III  TM Distance: >3 FB     Dental no notable dental hx. (+) Partial Lower, Upper Dentures   Pulmonary neg pulmonary ROS,  breath sounds clear to auscultation  Pulmonary exam normal       Cardiovascular negative cardio ROS Normal cardiovascular exam    Neuro/Psych Anxiety Depression negative neurological ROS     GI/Hepatic GERD-  Medicated and Controlled,(+) Hepatitis -  Endo/Other  Hypothyroidism   Renal/GU   negative genitourinary   Musculoskeletal  (+) Arthritis -, Osteoarthritis,    Abdominal Normal abdominal exam  (+)   Peds negative pediatric ROS (+)  Hematology  (+) anemia ,   Anesthesia Other Findings   Reproductive/Obstetrics                            Anesthesia Physical Anesthesia Plan  ASA: II  Anesthesia Plan: General   Post-op Pain Management:    Induction: Intravenous  Airway Management Planned: Nasal Cannula  Additional Equipment:   Intra-op Plan:   Post-operative Plan:   Informed Consent: I have reviewed the patients History and Physical, chart, labs and discussed the procedure including the risks, benefits and alternatives for the proposed anesthesia with the patient or authorized representative who has indicated his/her understanding and acceptance.   Dental advisory given  Plan Discussed with: CRNA and Surgeon  Anesthesia Plan Comments:         Anesthesia Quick Evaluation

## 2014-12-20 NOTE — Op Note (Signed)
[facility] Gastroenterology Patient Name: Cassidy [name] Procedure Date: 12/20/2014 11:33 AM MRN: [id number] Account #: [id number] Date of Birth: [birth year] Admit Type: Outpatient Age: 74 Room: [facility] ENDO ROOM 4 Gender: Female Note Status: Finalized Procedure:         Upper GI endoscopy Indications:       Dysphagia Providers:         Manya Silvas, MD Referring MD:      Ocie Cornfield. Ouida Sills, MD (Referring MD) Medicines:         Propofol per Anesthesia Complications:     No immediate complications. Procedure:         Pre-Anesthesia Assessment:                    - After reviewing the risks and benefits, the patient was                     deemed in satisfactory condition to undergo the procedure.                    After obtaining informed consent, the endoscope was passed                     under direct vision. Throughout the procedure, the                     patient's blood pressure, pulse, and oxygen saturations                     were monitored continuously. The Endoscope was introduced                     through the mouth, and advanced to the second part of                     duodenum. The upper GI endoscopy was accomplished without                     difficulty. The patient tolerated the procedure well. Findings:      A moderate Schatzki ring (acquired) was found at the gastroesophageal       junction. At the end of the procedure A guidewire was placed and the       scope was withdrawn. Dilation was performed with a Savary dilator with       mild resistance at 17 mm.      Patchy mildly erythematous mucosa without bleeding was found in the       gastric antrum. Biopsies were taken with a cold forceps for histology.       Biopsies were taken with a cold forceps for Helicobacter pylori testing.      The examined duodenum was normal. Impression:        - Moderate Schatzki ring. Dilated.                    - Erythematous mucosa in the antrum. Biopsied.                   - Normal examined duodenum. Recommendation:    - Await pathology results. Manya Silvas, MD 12/20/2014 11:56:44 AM This report has been signed electronically. Number of Addenda: 0 Note Initiated On: 12/20/2014 11:33 AM      [facility]

## 2014-12-20 NOTE — H&P (Signed)
Primary Care Physician:  Kirk Ruths., MD Primary Gastroenterologist:  Dr. Vira Agar  Pre-Procedure History & Physical: HPI:  Cassidy [name] is a 74 y.o. female is here for an endoscopy.   Past Medical History  Diagnosis Date  . Thyroid disease   . Anxiety   . Depression   . Hypothyroidism   . GERD (gastroesophageal reflux disease)   . Fatty liver   . Obesity     Past Surgical History  Procedure Laterality Date  . Tonsillectomy    . Appendectomy      Prior to Admission medications   Medication Sig Start Date End Date Taking? Authorizing Provider  aspirin-acetaminophen-caffeine (EXCEDRIN MIGRAINE) [phone] MG per tablet Take by mouth every 6 (six) hours as needed for headache.   Yes Historical Provider, MD  aspirin EC 81 MG tablet Take 81 mg by mouth at bedtime.    Historical Provider, MD  busPIRone (BUSPAR) 15 MG tablet Take 15 mg by mouth 2 (two) times daily.    Historical Provider, MD  escitalopram (LEXAPRO) 20 MG tablet Take 20 mg by mouth at bedtime.    Historical Provider, MD  ferrous fumarate-iron polysaccharide complex (TANDEM) 162-115.2 MG CAPS Take 1 capsule by mouth at bedtime.    Historical Provider, MD  fluticasone (FLONASE) 50 MCG/ACT nasal spray Place 1 spray into both nostrils daily.    Historical Provider, MD  levothyroxine (SYNTHROID, LEVOTHROID) 75 MCG tablet Take 75 mcg by mouth daily before breakfast.    Historical Provider, MD  omeprazole (PRILOSEC) 20 MG capsule Take 20 mg by mouth 2 (two) times daily.    Historical Provider, MD  QUEtiapine (SEROQUEL) 400 MG tablet Take 400 mg by mouth at bedtime.    Historical Provider, MD  traZODone (DESYREL) 150 MG tablet Take 1 tablet (150 mg total) by mouth at bedtime. 11/27/14   Marjie Skiff, MD    Allergies as of 12/13/2014 - Review Complete 12/08/2014  Allergen Reaction Noted  . Sulfa antibiotics Shortness Of Breath 11/27/2014    Family History  Problem Relation Age of Onset  . Diabetes Mother    . COPD Mother   . Kidney disease Mother   . Depression Mother   . Heart attack Father   . Aneurysm Father   . Anxiety disorder Sister   . Depression Sister   . Diabetes Sister   . Hypertension Sister   . Atrial fibrillation Brother   . Spinal muscular atrophy Brother   . Breast cancer Sister   . Bone cancer Sister   . Depression Sister   . Anxiety disorder Sister   . Breast cancer Sister   . Colon cancer Sister   . Depression Sister   . Diabetes Sister   . COPD Sister   . Depression Sister   . Anxiety disorder Sister   . Diabetes Brother   . Depression Brother     Social History   Social History  . Marital Status: Widowed    Spouse Name: N/A  . Number of Children: N/A  . Years of Education: N/A   Occupational History  . Not on file.   Social History Main Topics  . Smoking status: Never Smoker   . Smokeless tobacco: Never Used  . Alcohol Use: No  . Drug Use: No  . Sexual Activity: Not Currently    Birth Control/ Protection: Post-menopausal   Other Topics Concern  . Not on file   Social History Narrative    Review of Systems: See HPI,  otherwise negative ROS  Physical Exam: BP 133/66 mmHg  Pulse 80  Temp(Src) 98.3 F (36.8 C)  Resp 20  SpO2 96% General:   Alert,  pleasant and cooperative in NAD Head:  Normocephalic and atraumatic. Neck:  Supple; no masses or thyromegaly. Lungs:  Clear throughout to auscultation.    Heart:  Regular rate and rhythm. Abdomen:  Soft, nontender and nondistended. Normal bowel sounds, without guarding, and without rebound.   Neurologic:  Alert and  oriented x4;  grossly normal neurologically.  Impression/Plan: Cassidy [name] is here for an endoscopy to be performed for dysphagia  Risks, benefits, limitations, and alternatives regarding  endoscopy have been reviewed with the patient.  Questions have been answered.  All parties agreeable.   Gaylyn Cheers, MD  12/20/2014, 11:30 AM

## 2014-12-20 NOTE — Anesthesia Postprocedure Evaluation (Signed)
  Anesthesia Post-op Note  Patient: Cassidy [name]  Procedure(s) Performed: Procedure(s): ESOPHAGOGASTRODUODENOSCOPY (EGD) WITH PROPOFOL (N/A) SAVORY DILATION (N/A)  Anesthesia type:General  Patient location: PACU  Post pain: Pain level controlled  Post assessment: Post-op Vital signs reviewed, Patient's Cardiovascular Status Stable, Respiratory Function Stable, Patent Airway and No signs of Nausea or vomiting  Post vital signs: Reviewed and stable  Last Vitals:  Filed Vitals:   12/20/14 1230  BP: 132/72  Pulse: 88  Temp:   Resp: 18    Level of consciousness: awake, alert  and patient cooperative  Complications: No apparent anesthesia complications

## 2014-12-20 NOTE — Transfer of Care (Signed)
Immediate Anesthesia Transfer of Care Note  Patient: Cassidy [name]  Procedure(s) Performed: Procedure(s): ESOPHAGOGASTRODUODENOSCOPY (EGD) WITH PROPOFOL (N/A) SAVORY DILATION (N/A)  Patient Location: Endoscopy Unit  Anesthesia Type:General  Level of Consciousness: awake  Airway & Oxygen Therapy: Patient Spontanous Breathing and Patient connected to nasal cannula oxygen  Post-op Assessment: Report given to RN  Post vital signs: Reviewed  Last Vitals:  Filed Vitals:   12/20/14 1159  BP: 141/79  Pulse: 92  Temp: 36.2 C  Resp: 17    Complications: No apparent anesthesia complications

## 2014-12-22 LAB — SURGICAL PATHOLOGY

## 2015-01-05 DIAGNOSIS — M25569 Pain in unspecified knee: Secondary | ICD-10-CM | POA: Insufficient documentation

## 2015-01-09 ENCOUNTER — Ambulatory Visit
Admission: RE | Admit: 2015-01-09 | Discharge: 2015-01-09 | Disposition: A | Discharge status: Home / Self Care | Payer: Medicare Other | Source: Ambulatory Visit | Attending: Physical Medicine and Rehabilitation | Admitting: Physical Medicine and Rehabilitation

## 2015-01-09 DIAGNOSIS — M4856XA Collapsed vertebra, not elsewhere classified, lumbar region, initial encounter for fracture: Secondary | ICD-10-CM | POA: Insufficient documentation

## 2015-01-09 DIAGNOSIS — M47896 Other spondylosis, lumbar region: Secondary | ICD-10-CM | POA: Diagnosis not present

## 2015-01-09 DIAGNOSIS — M5416 Radiculopathy, lumbar region: Secondary | ICD-10-CM | POA: Diagnosis present

## 2015-01-09 DIAGNOSIS — X58XXXA Exposure to other specified factors, initial encounter: Secondary | ICD-10-CM | POA: Diagnosis not present

## 2015-01-23 ENCOUNTER — Ambulatory Visit: Payer: Medicare Other | Attending: Physical Medicine and Rehabilitation | Admitting: Physical Therapy

## 2015-01-23 DIAGNOSIS — M545 Low back pain, unspecified: Secondary | ICD-10-CM

## 2015-01-23 DIAGNOSIS — R262 Difficulty in walking, not elsewhere classified: Secondary | ICD-10-CM | POA: Diagnosis not present

## 2015-01-23 DIAGNOSIS — R2681 Unsteadiness on feet: Secondary | ICD-10-CM | POA: Diagnosis present

## 2015-01-23 DIAGNOSIS — M25561 Pain in right knee: Secondary | ICD-10-CM | POA: Diagnosis present

## 2015-01-23 DIAGNOSIS — M25562 Pain in left knee: Secondary | ICD-10-CM | POA: Diagnosis present

## 2015-01-23 NOTE — Patient Instructions (Addendum)
Knee High   Holding stable object, raise knee to hip level, then lower knee. Repeat with other knee. Complete __10_ repetitions. Do __2__ sessions per day.  ABDUCTION: Standing (Active)   Stand, feet flat. Lift right leg out to side. Use _0__ lbs. Complete __10_ repetitions. Perform __2_ sessions per day.         EXTENSION: Standing (Active)  Stand, both feet flat. Draw right leg behind body as far as possible. Use 0___ lbs. Complete 10 repetitions. Perform __2_ sessions per day.  Copyright  VHI. All rights reserved

## 2015-01-23 NOTE — Therapy (Signed)
[place] MAIN [facility] SERVICES [place], [place], [address] Phone: [phone]   Fax:  [phone]  Physical Therapy Evaluation  Patient Details  Name: Cassidy [name] MRN: [id number] Date of Birth: 08/19/1940 Referring Provider:  Sharlet Salina, MD  Encounter Date: 01/23/2015    Past Medical History  Diagnosis Date  . Thyroid disease   . Anxiety   . Depression   . Hypothyroidism   . GERD (gastroesophageal reflux disease)   . Fatty liver   . Obesity     Past Surgical History  Procedure Laterality Date  . Tonsillectomy    . Appendectomy    . Esophagogastroduodenoscopy (egd) with propofol N/A 12/20/2014    Procedure: ESOPHAGOGASTRODUODENOSCOPY (EGD) WITH PROPOFOL;  Surgeon: Manya Silvas, MD;  Location: [place];  Service: Endoscopy;  Laterality: N/A;  . Savory dilation N/A 12/20/2014    Procedure: SAVORY DILATION;  Surgeon: Manya Silvas, MD;  Location: [facility] ENDOSCOPY;  Service: Endoscopy;  Laterality: N/A;    There were no vitals filed for this visit.  Visit Diagnosis:  Difficulty walking  Unsteady gait  Back pain at L4-L5 level  Arthralgia of both knees      Subjective Assessment - 01/23/15 1618    Subjective Dr. Melrose Nakayama wrote put in order for knee pain and assigned sit-to-stands for 5 minutes a day, but pain is continuing then got saw Dr. for back pain and is said to walk leaning over to right side. Is currently in wheelchair, but has a WC and cane to use. Asked to have [place] with wheels on the front inside.    Patient is accompained by: Family member            [facility] PT Assessment - 01/23/15 0001    Assessment   Medical Diagnosis back pain   Onset Date/Surgical Date 01/22/15   Hand Dominance Right   Next MD Visit 02/14/2015   Prior Therapy Yes   Balance Screen   Has the patient fallen in the past 6 months Yes   How many times? 3-4   Has the patient had a decrease in activity level because of  a fear of falling?  Yes   Is the patient reluctant to leave their home because of a fear of falling?  Yes   [place] Private residence   Living Arrangements Other relatives  grandson   Available Help at Discharge Family   Type of [place] to enter   Entrance Stairs-Number of Steps 4   Entrance Stairs-Rails Can reach both   [place] One level   [place] - standard;Cane - single point;Tub bench   Prior Function   Level of Independence Independent       PAIN: constant; relieved by sitting or laying on back; 10/10 when standing  POSTURE:  WFL standing   PROM/AROM: WFL  STRENGTH:  Graded on a 0-5 scale Muscle Group Left Right  Shoulder flex    Shoulder Abd    Shoulder Ext    Shoulder IR/ER    Elbow    Wrist/hand    Hip Flex    Hip Abd    Hip Add    Hip Ext    Hip IR/ER    Knee Flex 4+ 4+  Knee Ext 4+ 4+  Ankle DF 4 4  Ankle PF 5 5   SENSATION: intact BLE  SPECIAL TESTS: SLR test negative, decreased flexibility  and tight hamstring bilaterally   FUNCTIONAL MOBILITY: Patient has poor functional mobility with inability to perform sidelyiing to prone position. Patient has decreased transfer sit to stand with poor initial static standing balance.   BALANCE: poor dynamic and static standing    GAIT: patient ambulates with decreased gait speed, decreased step height and decreased step length for limited household distances with SW.   OUTCOME MEASURES: TEST Outcome Interpretation  5 times sit<>stand 21.09 sec >60 yo, >15 sec indicates increased risk for falls  10 meter walk test        0.296 m/s <1.0 m/s indicates increased risk for falls; limited community ambulator  Timed up and Go        39.52 sec <14 sec indicates increased risk for falls                                     PT Long Term Goals - 01/23/15 1722    PT LONG TERM GOAL #1   Title Patient will be independent  in home exercise program to improve strength/mobility for better functional independence with ADLs 03/20/15   PT LONG TERM GOAL #2   Title Patient (> 71 years old) will complete five times sit to stand test in < 15 seconds indicating an increased LE strength and improved balance 03/20/15   PT LONG TERM GOAL #3   Title Patient will increase 10 meter walk test to >1.35ms as to improve gait speed for better community ambulation and to reduce fall risk 03/20/15   PT LONG TERM GOAL #4   Title Patient will tolerate 5 seconds of single leg stance without loss of balance to improve ability to get in and out of shower safely 03/20/15               Plan - 01/23/15 1717    Clinical Impression Statement Patient is 74yr old female that presents with complaints of low back pain and BLE knee pain, decreased mobility with decreased ambulation distances. She has had several falls in the last 6 months. She has decreased static and dynamic standing balance  including inability to tandem stand and single leg stand.    Pt will benefit from skilled therapeutic intervention in order to improve on the following deficits Abnormal gait;Obesity;Decreased activity tolerance;Decreased knowledge of use of DME;Pain;Difficulty walking;Decreased mobility;Decreased balance;Decreased cognition;Decreased range of motion;Impaired flexibility   Rehab Potential Good   PT Frequency 2x / week   PT Duration 8 weeks   PT Treatment/Interventions Electrical Stimulation;Moist Heat;Balance training;Therapeutic exercise;Therapeutic activities;Functional mobility training;Stair training;Gait training;DME Instruction;Neuromuscular re-education;Patient/family education;Manual techniques;Passive range of motion   Consulted and Agree with Plan of Care Patient;Family member/caregiver         Problem List Patient Active Problem List   Diagnosis Date Noted  . Absolute anemia 11/27/2014  . Arthritis 11/27/2014  . Acid reflux 11/27/2014   . Depression, major, recurrent, in partial remission ([place] 11/27/2014  . Encounter for general adult medical examination without abnormal findings 08/25/2014  . Chronic LBP 08/15/2014  . Complete rotator cuff rupture of left shoulder 05/01/2014  . Infraspinatus tenosynovitis 05/01/2014  . Difficulty in walking 11/30/2013  . Extreme obesity ([place] 11/30/2013  . Arthritis, degenerative 10/01/2013  . Steatohepatitis 10/01/2013  . Orthostasis 10/01/2013  . 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 Fall 09/26/2013  . Dizziness 09/07/2013  . Osteoporosis with fracture 09/06/2013  . Clinical depression 03/25/2012  . Adult hypothyroidism  03/25/2012  . Esophageal stenosis 07/03/2009  . Back ache 03/08/2003  . Anxiety state 12/27/2002  Alanson Puls, PT, DPT  [place], Minette [place] S 01/23/2015, 5:32 PM  [place] MAIN [facility] SERVICES [address] [place], [place], [address] Phone: [phone]   Fax:  [phone]

## 2015-01-30 ENCOUNTER — Encounter: Payer: Self-pay | Admitting: Physical Therapy

## 2015-01-30 ENCOUNTER — Ambulatory Visit: Payer: Medicare Other | Admitting: Physical Therapy

## 2015-01-30 DIAGNOSIS — R262 Difficulty in walking, not elsewhere classified: Secondary | ICD-10-CM | POA: Diagnosis not present

## 2015-01-30 DIAGNOSIS — M545 Low back pain, unspecified: Secondary | ICD-10-CM

## 2015-01-30 DIAGNOSIS — R2681 Unsteadiness on feet: Secondary | ICD-10-CM

## 2015-01-30 DIAGNOSIS — M25562 Pain in left knee: Secondary | ICD-10-CM

## 2015-01-30 DIAGNOSIS — M25561 Pain in right knee: Secondary | ICD-10-CM

## 2015-01-30 NOTE — Therapy (Signed)
[place] MAIN [facility] SERVICES [address] [place], [place], [address] Phone: [phone]   Fax:  [phone]  Physical Therapy Treatment  Patient Details  Name: Cassidy [name] MRN: [id number] Date of Birth: 1940/05/28 Referring Provider:  Sharlet Salina, MD  Encounter Date: 01/30/2015    Past Medical History  Diagnosis Date  . Thyroid disease   . Anxiety   . Depression   . Hypothyroidism   . GERD (gastroesophageal reflux disease)   . Fatty liver   . Obesity     Past Surgical History  Procedure Laterality Date  . Tonsillectomy    . Appendectomy    . Esophagogastroduodenoscopy (egd) with propofol N/A 12/20/2014    Procedure: ESOPHAGOGASTRODUODENOSCOPY (EGD) WITH PROPOFOL;  Surgeon: Manya Silvas, MD;  Location: [facility];  Service: Endoscopy;  Laterality: N/A;  . Savory dilation N/A 12/20/2014    Procedure: SAVORY DILATION;  Surgeon: Manya Silvas, MD;  Location: [facility] ENDOSCOPY;  Service: Endoscopy;  Laterality: N/A;    There were no vitals filed for this visit.  Visit Diagnosis:  Difficulty walking  Unsteady gait  Back pain at L4-L5 level  Arthralgia of both knees      Subjective Assessment - 01/30/15 1700    Subjective Patient is having low back pain that is 2/10 and increases to 10/10 with walking.    Patient is accompained by: Family member        Therapeutic exercise including ; Hooklying marching with TA, hip abd/ER with TA, heel slides with TA x 20 x 2 sets Nu-step x 10 minutes with MH Gait training wihtout AD  30 feet  And 100 feet hand held with shuffling gait and no reports of pain Patient needs cues to continue exercises and perform them correctly.                          PT Education - 01/30/15 1702    Education provided Yes   Education Details HEP   Person(s) Educated Patient   Methods Explanation   Comprehension Verbalized understanding             PT Long  Term Goals - 01/23/15 1722    PT LONG TERM GOAL #1   Title Patient will be independent in home exercise program to improve strength/mobility for better functional independence with ADLs 03/20/15   PT LONG TERM GOAL #2   Title Patient (> 74 years old) will complete five times sit to stand test in < 15 seconds indicating an increased LE strength and improved balance 03/20/15   PT LONG TERM GOAL #3   Title Patient will increase 10 meter walk test to >1.8ms as to improve gait speed for better community ambulation and to reduce fall risk 03/20/15   PT LONG TERM GOAL #4   Title Patient will tolerate 5 seconds of single leg stance without loss of balance to improve ability to get in and out of shower safely 03/20/15               Plan - 01/30/15 1703    Clinical Impression Statement Patient is having severe back pain that limits her walking distance and she sits and rests and the pain decreases after she sits down.    Pt will benefit from skilled therapeutic intervention in order to improve on the following deficits Abnormal gait;Obesity;Decreased activity tolerance;Decreased knowledge of use of DME;Pain;Difficulty walking;Decreased mobility;Decreased balance;Decreased cognition;Decreased range of motion;Impaired flexibility  Rehab Potential Good   PT Frequency 2x / week   PT Duration 8 weeks   PT Treatment/Interventions Electrical Stimulation;Moist Heat;Balance training;Therapeutic exercise;Therapeutic activities;Functional mobility training;Stair training;Gait training;DME Instruction;Neuromuscular re-education;Patient/family education;Manual techniques;Passive range of motion   Consulted and Agree with Plan of Care Patient;Family member/caregiver        Problem List Patient Active Problem List   Diagnosis Date Noted  . Absolute anemia 11/27/2014  . Arthritis 11/27/2014  . Acid reflux 11/27/2014  . Depression, major, recurrent, in partial remission ([place]) 11/27/2014  . Encounter  for general adult medical examination without abnormal findings 08/25/2014  . Chronic LBP 08/15/2014  . Complete rotator cuff rupture of left shoulder 05/01/2014  . Infraspinatus tenosynovitis 05/01/2014  . Difficulty in walking 11/30/2013  . Extreme obesity ([place]) 11/30/2013  . Arthritis, degenerative 10/01/2013  . Steatohepatitis 10/01/2013  . Orthostasis 10/01/2013  . 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 Fall 09/26/2013  . Dizziness 09/07/2013  . Osteoporosis with fracture 09/06/2013  . Clinical depression 03/25/2012  . Adult hypothyroidism 03/25/2012  . Esophageal stenosis 07/03/2009  . Back ache 03/08/2003  . Anxiety state 12/27/2002    Alanson Puls 01/30/2015, 6:02 PM  [place] MAIN [facility] SERVICES [address] [place], [place], [address] Phone: [phone]   Fax:  [phone]

## 2015-02-01 ENCOUNTER — Encounter: Payer: Self-pay | Admitting: Physical Therapy

## 2015-02-01 ENCOUNTER — Ambulatory Visit: Payer: Medicare Other | Admitting: Physical Therapy

## 2015-02-01 DIAGNOSIS — R262 Difficulty in walking, not elsewhere classified: Secondary | ICD-10-CM | POA: Diagnosis not present

## 2015-02-01 DIAGNOSIS — M25562 Pain in left knee: Secondary | ICD-10-CM

## 2015-02-01 DIAGNOSIS — M545 Low back pain, unspecified: Secondary | ICD-10-CM

## 2015-02-01 DIAGNOSIS — M25561 Pain in right knee: Secondary | ICD-10-CM

## 2015-02-01 DIAGNOSIS — R2681 Unsteadiness on feet: Secondary | ICD-10-CM

## 2015-02-01 NOTE — Therapy (Signed)
[place] MAIN [facility] SERVICES [address] [place], [place], [address] Phone: [phone]   Fax:  [phone]  Physical Therapy Treatment  Patient Details  Name: Cassidy [name] MRN: [id number] Date of Birth: [birth year] Referring Provider:  Sharlet Salina, MD  Encounter Date: 02/01/2015    Past Medical History  Diagnosis Date  . Thyroid disease   . Anxiety   . Depression   . Hypothyroidism   . GERD (gastroesophageal reflux disease)   . Fatty liver   . Obesity     Past Surgical History  Procedure Laterality Date  . Tonsillectomy    . Appendectomy    . Esophagogastroduodenoscopy (egd) with propofol N/A 12/20/2014    Procedure: ESOPHAGOGASTRODUODENOSCOPY (EGD) WITH PROPOFOL;  Surgeon: Manya Silvas, MD;  Location: [place];  Service: Endoscopy;  Laterality: N/A;  . Savory dilation N/A 12/20/2014    Procedure: SAVORY DILATION;  Surgeon: Manya Silvas, MD;  [facility] ENDOSCOPY;  Service: Endoscopy;  Laterality: N/A;    There were no vitals filed for this visit.  Visit Diagnosis:  Difficulty walking  Unsteady gait  Back pain at L4-L5 level  Arthralgia of both knees      Subjective Assessment - 02/01/15 1641    Subjective Patient is having low back pain that is 2/10 and increases to 10/10 with walking.    Patient is accompained by: Family member   Currently in Pain? Yes   Pain Score 2    Pain Location Back   Pain Descriptors / Indicators Aching   Pain Type Chronic pain      Therapeutic exercise including : MH to low back during exercise and stretching Stretching  Bilateral hip flex ; B hip add Hooklying marching with TA, hip abd/ER with TA, heel slides with TA x 20 x 2 sets Heel slides BLE , bridging x 20 x 2 Nu-step x 10 minutes with MH Gait training wihtout AD 30 feet And 100 feet hand held with shuffling gait and no reports of pain Patient needs cues to continue exercises and perform them  correctly                           PT Education - 02/01/15 1644    Education provided Yes   Education Details HEP   Person(s) Educated Patient   Methods Explanation   Comprehension Verbalized understanding             PT Long Term Goals - 01/23/15 1722    PT LONG TERM GOAL #1   Title Patient will be independent in home exercise program to improve strength/mobility for better functional independence with ADLs 03/20/15   PT LONG TERM GOAL #2   Title Patient (> 80 years old) will complete five times sit to stand test in < 15 seconds indicating an increased LE strength and improved balance 03/20/15   PT LONG TERM GOAL #3   Title Patient will increase 10 meter walk test to >1.46ms as to improve gait speed for better community ambulation and to reduce fall risk 03/20/15   PT LONG TERM GOAL #4   Title Patient will tolerate 5 seconds of single leg stance without loss of balance to improve ability to get in and out of shower safely 03/20/15               Plan - 02/01/15 1644    Clinical Impression Statement Patient has trunk weakness and has difficulty  standing up straight to perform exercises   Pt will benefit from skilled therapeutic intervention in order to improve on the following deficits Abnormal gait;Obesity;Decreased activity tolerance;Decreased knowledge of use of DME;Pain;Difficulty walking;Decreased mobility;Decreased balance;Decreased cognition;Decreased range of motion;Impaired flexibility   Rehab Potential Good   PT Frequency 2x / week   PT Duration 8 weeks   PT Treatment/Interventions Electrical Stimulation;Moist Heat;Balance training;Therapeutic exercise;Therapeutic activities;Functional mobility training;Stair training;Gait training;DME Instruction;Neuromuscular re-education;Patient/family education;Manual techniques;Passive range of motion   Consulted and Agree with Plan of Care Patient;Family member/caregiver        Problem  List Patient Active Problem List   Diagnosis Date Noted  . Absolute anemia 11/27/2014  . Arthritis 11/27/2014  . Acid reflux 11/27/2014  . Depression, major, recurrent, in partial remission ([place]) 11/27/2014  . Encounter for general adult medical examination without abnormal findings 08/25/2014  . Chronic LBP 08/15/2014  . Complete rotator cuff rupture of left shoulder 05/01/2014  . Infraspinatus tenosynovitis 05/01/2014  . Difficulty in walking 11/30/2013  . Extreme obesity ([place]) 11/30/2013  . Arthritis, degenerative 10/01/2013  . Steatohepatitis 10/01/2013  . Orthostasis 10/01/2013  . 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 Fall 09/26/2013  . Dizziness 09/07/2013  . Osteoporosis with fracture 09/06/2013  . Clinical depression 03/25/2012  . Adult hypothyroidism 03/25/2012  . Esophageal stenosis 07/03/2009  . Back ache 03/08/2003  . Anxiety state 12/27/2002    Alanson Puls 02/01/2015, 4:59 PM  [place] MAIN [facility] SERVICES [address] [place], [place], [address] Phone: [phone]   Fax:  [phone]

## 2015-02-06 ENCOUNTER — Ambulatory Visit: Payer: Medicare Other | Admitting: Physical Therapy

## 2015-02-06 ENCOUNTER — Encounter: Payer: Self-pay | Admitting: Physical Therapy

## 2015-02-06 DIAGNOSIS — R2681 Unsteadiness on feet: Secondary | ICD-10-CM

## 2015-02-06 DIAGNOSIS — M545 Low back pain, unspecified: Secondary | ICD-10-CM

## 2015-02-06 DIAGNOSIS — R262 Difficulty in walking, not elsewhere classified: Secondary | ICD-10-CM

## 2015-02-06 DIAGNOSIS — M25561 Pain in right knee: Secondary | ICD-10-CM

## 2015-02-06 DIAGNOSIS — M25562 Pain in left knee: Secondary | ICD-10-CM

## 2015-02-06 NOTE — Therapy (Signed)
[place] MAIN [facility] SERVICES [address] [place], [place], [address] Phone: [phone]   Fax:  [phone]  Physical Therapy Treatment  Patient Details  Name: Cassidy [name] MRN: [id number] Date of Birth: [birth year] No Data Recorded  Encounter Date: 02/06/2015    Past Medical History  Diagnosis Date  . Thyroid disease   . Anxiety   . Depression   . Hypothyroidism   . GERD (gastroesophageal reflux disease)   . Fatty liver   . Obesity     Past Surgical History  Procedure Laterality Date  . Tonsillectomy    . Appendectomy    . Esophagogastroduodenoscopy (egd) with propofol N/A 12/20/2014    Procedure: ESOPHAGOGASTRODUODENOSCOPY (EGD) WITH PROPOFOL;  Surgeon: Manya Silvas, MD;  Location: [place];  Service: Endoscopy;  Laterality: N/A;  . Savory dilation N/A 12/20/2014    Procedure: SAVORY DILATION;  Surgeon: Manya Silvas, MD;  Location: [facility] ENDOSCOPY;  Service: Endoscopy;  Laterality: N/A;    There were no vitals filed for this visit.  Visit Diagnosis:  Difficulty walking  Unsteady gait  Back pain at L4-L5 level  Arthralgia of both knees      Subjective Assessment - 02/06/15 1648    Subjective Patient is not having any back pain but she feels unsteady today.    Patient is accompained by: Family member   Currently in Pain? No/denies       Therapeutic exercise: Standing exercises in parallel bars including hip 4 way hip bilaterally  Nu-step x 10 mintues with MH to low back  Toe tapping fwd flex x 20 with one hand hold Marching x 20 Step ups x 10  Balance training on foam feet apart and feet together with head turns and vertical  head turns Walking on blue foam side stepping and waling fwd walking.x 10 feet x 3 Patient needs several rest periods during treatment session Instructed in posture and cuing to complete the full ROM and not to shuffle her stepping  pattern                           PT Education - 02/06/15 1649    Education provided Yes   Education Details HEP   Person(s) Educated Patient   Methods Explanation   Comprehension Verbalized understanding             PT Long Term Goals - 01/23/15 1722    PT LONG TERM GOAL #1   Title Patient will be independent in home exercise program to improve strength/mobility for better functional independence with ADLs 03/20/15   PT LONG TERM GOAL #2   Title Patient (> 65 years old) will complete five times sit to stand test in < 15 seconds indicating an increased LE strength and improved balance 03/20/15   PT LONG TERM GOAL #3   Title Patient will increase 10 meter walk test to >1.56ms as to improve gait speed for better community ambulation and to reduce fall risk 03/20/15   PT LONG TERM GOAL #4   Title Patient will tolerate 5 seconds of single leg stance without loss of balance to improve ability to get in and out of shower safely 03/20/15               Plan - 02/06/15 1649    Clinical Impression Statement  Muscle fatigue but no major pain complaints. Patient advancing to standing for exercises listed above   Pt will  benefit from skilled therapeutic intervention in order to improve on the following deficits Abnormal gait;Obesity;Decreased activity tolerance;Decreased knowledge of use of DME;Pain;Difficulty walking;Decreased mobility;Decreased balance;Decreased cognition;Decreased range of motion;Impaired flexibility   Rehab Potential Good   PT Frequency 2x / week   PT Duration 8 weeks   PT Treatment/Interventions Electrical Stimulation;Moist Heat;Balance training;Therapeutic exercise;Therapeutic activities;Functional mobility training;Stair training;Gait training;DME Instruction;Neuromuscular re-education;Patient/family education;Manual techniques;Passive range of motion   Consulted and Agree with Plan of Care Patient;Family member/caregiver         Problem List Patient Active Problem List   Diagnosis Date Noted  . Absolute anemia 11/27/2014  . Arthritis 11/27/2014  . Acid reflux 11/27/2014  . Depression, major, recurrent, in partial remission ([place]) 11/27/2014  . Encounter for general adult medical examination without abnormal findings 08/25/2014  . Chronic LBP 08/15/2014  . Complete rotator cuff rupture of left shoulder 05/01/2014  . Infraspinatus tenosynovitis 05/01/2014  . Difficulty in walking 11/30/2013  . Extreme obesity ([place]) 11/30/2013  . Arthritis, degenerative 10/01/2013  . Steatohepatitis 10/01/2013  . Orthostasis 10/01/2013  . 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 Fall 09/26/2013  . Dizziness 09/07/2013  . Osteoporosis with fracture 09/06/2013  . Clinical depression 03/25/2012  . Adult hypothyroidism 03/25/2012  . Esophageal stenosis 07/03/2009  . Back ache 03/08/2003  . Anxiety state 12/27/2002    Alanson Puls 02/06/2015, 5:26 PM  [place] MAIN [facility] SERVICES [address] [place], [place], [address] Phone: [phone]   Fax:  [phone]  Name: Cassidy [name] MRN: [id number] Date of Birth: 08-19-1940

## 2015-02-08 ENCOUNTER — Ambulatory Visit: Payer: Medicare Other | Admitting: Physical Therapy

## 2015-02-13 ENCOUNTER — Ambulatory Visit: Payer: Medicare Other | Admitting: Physical Therapy

## 2015-02-13 ENCOUNTER — Encounter: Payer: Self-pay | Admitting: Physical Therapy

## 2015-02-13 DIAGNOSIS — M25561 Pain in right knee: Secondary | ICD-10-CM

## 2015-02-13 DIAGNOSIS — M545 Low back pain, unspecified: Secondary | ICD-10-CM

## 2015-02-13 DIAGNOSIS — M25562 Pain in left knee: Secondary | ICD-10-CM

## 2015-02-13 DIAGNOSIS — R262 Difficulty in walking, not elsewhere classified: Secondary | ICD-10-CM

## 2015-02-13 DIAGNOSIS — R2681 Unsteadiness on feet: Secondary | ICD-10-CM

## 2015-02-13 NOTE — Therapy (Signed)
[place] MAIN [facility] SERVICES [address] [place], [place], [address] Phone: [phone]   Fax:  [phone]  Physical Therapy Treatment  Patient Details  Name: Cassidy [name] MRN: [id number] Date of Birth: 1940-11-09 No Data Recorded  Encounter Date: 02/13/2015    Past Medical History  Diagnosis Date  . Thyroid disease   . Anxiety   . Depression   . Hypothyroidism   . GERD (gastroesophageal reflux disease)   . Fatty liver   . Obesity     Past Surgical History  Procedure Laterality Date  . Tonsillectomy    . Appendectomy    . Esophagogastroduodenoscopy (egd) with propofol N/A 12/20/2014    Procedure: ESOPHAGOGASTRODUODENOSCOPY (EGD) WITH PROPOFOL;  Surgeon: Manya Silvas, MD;  Location: [place];  Service: Endoscopy;  Laterality: N/A;  . Savory dilation N/A 12/20/2014    Procedure: SAVORY DILATION;  Surgeon: Manya Silvas, MD;  Location: [facility] ENDOSCOPY;  Service: Endoscopy;  Laterality: N/A;    There were no vitals filed for this visit.  Visit Diagnosis:  Difficulty walking  Unsteady gait  Back pain at L4-L5 level  Arthralgia of both knees      Subjective Assessment - 02/13/15 1556    Subjective Patient is not having any back pain but she feels unsteady today. She has been having left knee pain intermittent.    Patient is accompained by: Family member        Therapeutic exercise: Standing exercises in parallel bars including hip 4 way hip bilaterally  Nu-step x 10 mintues with MH to low back  Toe tapping fwd flex x 20 with one hand hold 4 way with YTB x 20 Marching x 20 Step ups x 10  Balance training on foam feet apart and feet together with head turns and vertical head turns Walking on blue foam side stepping and waling fwd walking.x 10 feet x 3 Patient needs several rest periods during treatment session Instructed in posture and cuing to complete the full ROM and not to shuffle her stepping  pattern                          PT Education - 02/13/15 1557    Education provided Yes   Education Details HEP   Person(s) Educated Patient   Methods Explanation   Comprehension Verbalized understanding             PT Long Term Goals - 01/23/15 1722    PT LONG TERM GOAL #1   Title Patient will be independent in home exercise program to improve strength/mobility for better functional independence with ADLs 03/20/15   PT LONG TERM GOAL #2   Title Patient (> 105 years old) will complete five times sit to stand test in < 15 seconds indicating an increased LE strength and improved balance 03/20/15   PT LONG TERM GOAL #3   Title Patient will increase 10 meter walk test to >1.72ms as to improve gait speed for better community ambulation and to reduce fall risk 03/20/15   PT LONG TERM GOAL #4   Title Patient will tolerate 5 seconds of single leg stance without loss of balance to improve ability to get in and out of shower safely 03/20/15               Plan - 02/13/15 1558    Clinical Impression Statement Dynamic standing balance is impaired and she needs min assist for side stepping  on blue foam. She continues to have decreased strength  of BLE and trunk.    Pt will benefit from skilled therapeutic intervention in order to improve on the following deficits Abnormal gait;Obesity;Decreased activity tolerance;Decreased knowledge of use of DME;Pain;Difficulty walking;Decreased mobility;Decreased balance;Decreased cognition;Decreased range of motion;Impaired flexibility   Rehab Potential Good   PT Frequency 2x / week   PT Duration 8 weeks   PT Treatment/Interventions Electrical Stimulation;Moist Heat;Balance training;Therapeutic exercise;Therapeutic activities;Functional mobility training;Stair training;Gait training;DME Instruction;Neuromuscular re-education;Patient/family education;Manual techniques;Passive range of motion   Consulted and Agree with Plan of Care  Patient;Family member/caregiver        Problem List Patient Active Problem List   Diagnosis Date Noted  . Absolute anemia 11/27/2014  . Arthritis 11/27/2014  . Acid reflux 11/27/2014  . Depression, major, recurrent, in partial remission ([place]) 11/27/2014  . Encounter for general adult medical examination without abnormal findings 08/25/2014  . Chronic LBP 08/15/2014  . Complete rotator cuff rupture of left shoulder 05/01/2014  . Infraspinatus tenosynovitis 05/01/2014  . Difficulty in walking 11/30/2013  . Extreme obesity ([place]) 11/30/2013  . Arthritis, degenerative 10/01/2013  . Steatohepatitis 10/01/2013  . Orthostasis 10/01/2013  . 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 Fall 09/26/2013  . Dizziness 09/07/2013  . Osteoporosis with fracture 09/06/2013  . Clinical depression 03/25/2012  . Adult hypothyroidism 03/25/2012  . Esophageal stenosis 07/03/2009  . Back ache 03/08/2003  . Anxiety state 12/27/2002    Alanson Puls 02/13/2015, 4:01 PM  [place] MAIN [facility] SERVICES [address] [place], [place], [address] Phone: [phone]   Fax:  [phone]  Name: Cassidy [name] MRN: [id number] Date of Birth: [birth year]

## 2015-02-15 ENCOUNTER — Ambulatory Visit: Payer: Medicare Other | Admitting: Physical Therapy

## 2015-02-20 ENCOUNTER — Encounter: Payer: Self-pay | Admitting: Physical Therapy

## 2015-02-20 ENCOUNTER — Ambulatory Visit: Payer: Medicare Other | Attending: Physical Medicine and Rehabilitation | Admitting: Physical Therapy

## 2015-02-20 DIAGNOSIS — M25562 Pain in left knee: Secondary | ICD-10-CM | POA: Diagnosis present

## 2015-02-20 DIAGNOSIS — M545 Low back pain, unspecified: Secondary | ICD-10-CM

## 2015-02-20 DIAGNOSIS — R262 Difficulty in walking, not elsewhere classified: Secondary | ICD-10-CM

## 2015-02-20 DIAGNOSIS — M25561 Pain in right knee: Secondary | ICD-10-CM | POA: Diagnosis present

## 2015-02-20 DIAGNOSIS — R2681 Unsteadiness on feet: Secondary | ICD-10-CM | POA: Diagnosis present

## 2015-02-20 NOTE — Therapy (Signed)
[place] MAIN [facility] SERVICES [address] [place], [place], [address] Phone: [phone]   Fax:  [phone]  Physical Therapy Treatment  Patient Details  Name: Cassidy [name] MRN: [id number] Date of Birth: [birth year] No Data Recorded  Encounter Date: 02/20/2015      PT End of Session - 02/20/15 1600    Visit Number 6   Number of Visits 17   PT Start Time 0330   PT Stop Time 0415   PT Time Calculation (min) 45 min   Activity Tolerance Patient tolerated treatment well      Past Medical History  Diagnosis Date  . Thyroid disease   . Anxiety   . Depression   . Hypothyroidism   . GERD (gastroesophageal reflux disease)   . Fatty liver   . Obesity     Past Surgical History  Procedure Laterality Date  . Tonsillectomy    . Appendectomy    . Esophagogastroduodenoscopy (egd) with propofol N/A 12/20/2014    Procedure: ESOPHAGOGASTRODUODENOSCOPY (EGD) WITH PROPOFOL;  Surgeon: Manya Silvas, MD;  Location: [place];  Service: Endoscopy;  Laterality: N/A;  . Savory dilation N/A 12/20/2014    Procedure: SAVORY DILATION;  Surgeon: Manya Silvas, MD;  Location: [facility] ENDOSCOPY;  Service: Endoscopy;  Laterality: N/A;    There were no vitals filed for this visit.  Visit Diagnosis:  Difficulty walking  Unsteady gait  Back pain at L4-L5 level  Arthralgia of both knees      Subjective Assessment - 02/20/15 1558    Subjective Patient is not having any back pain but she feels unsteady today. She has been having left knee pain intermittent.    Patient is accompained by: Family member   Currently in Pain? No/denies        standing hip abd with YTB x 20  side stepping left and right in parallel bars 10 feet x 3 standing on blue foam with cone reaching x 20 across midline step ups from floor to 6 inch stool x 20 bilateral sit to stand x 10 marching in parallel bars x 20 stepping pattern with weight shifting fwd/bwd x 10.   Patient continues to demonstrate difficultly with select exercises such as reaching and stepping backwards. Patient responds well to verbal and tactile cues to correct form and technique.  Motor control of LE much improved.  Muscle fatigue but no major pain complaints.                          PT Education - 02/20/15 1558    Education provided Yes   Education Details HEP   Person(s) Educated Patient   Methods Explanation   Comprehension Verbalized understanding             PT Long Term Goals - 01/23/15 1722    PT LONG TERM GOAL #1   Title Patient will be independent in home exercise program to improve strength/mobility for better functional independence with ADLs 03/20/15   PT LONG TERM GOAL #2   Title Patient (74 years old) will complete five times sit to stand test in < 15 seconds indicating an increased LE strength and improved balance 03/20/15   PT LONG TERM GOAL #3   Title Patient will increase 10 meter walk test to >1.71ms as to improve gait speed for better community ambulation and to reduce fall risk 03/20/15   PT LONG TERM GOAL #4   Title Patient  will tolerate 5 seconds of single leg stance without loss of balance to improve ability to get in and out of shower safely 03/20/15               Plan - 02/20/15 1600    Pt will benefit from skilled therapeutic intervention in order to improve on the following deficits Abnormal gait;Obesity;Decreased activity tolerance;Decreased knowledge of use of DME;Pain;Difficulty walking;Decreased mobility;Decreased balance;Decreased cognition;Decreased range of motion;Impaired flexibility   Rehab Potential Good   PT Frequency 2x / week   PT Duration 8 weeks   PT Treatment/Interventions Electrical Stimulation;Moist Heat;Balance training;Therapeutic exercise;Therapeutic activities;Functional mobility training;Stair training;Gait training;DME Instruction;Neuromuscular re-education;Patient/family education;Manual  techniques;Passive range of motion   Consulted and Agree with Plan of Care Patient;Family member/caregiver        Problem List Patient Active Problem List   Diagnosis Date Noted  . Absolute anemia 11/27/2014  . Arthritis 11/27/2014  . Acid reflux 11/27/2014  . Depression, major, recurrent, in partial remission ([place]) 11/27/2014  . Encounter for general adult medical examination without abnormal findings 08/25/2014  . Chronic LBP 08/15/2014  . Complete rotator cuff rupture of left shoulder 05/01/2014  . Infraspinatus tenosynovitis 05/01/2014  . Difficulty in walking 11/30/2013  . Extreme obesity ([place]) 11/30/2013  . Arthritis, degenerative 10/01/2013  . Steatohepatitis 10/01/2013  . Orthostasis 10/01/2013  . 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 Fall 09/26/2013  . Dizziness 09/07/2013  . Osteoporosis with fracture 09/06/2013  . Clinical depression 03/25/2012  . Adult hypothyroidism 03/25/2012  . Esophageal stenosis 07/03/2009  . Back ache 03/08/2003  . Anxiety state 12/27/2002    Alanson Puls 02/20/2015, 4:02 PM  [place] MAIN [facility] SERVICES [address] [place], [place], [address] Phone: [phone]   Fax:  [phone]  Name: KAELYNNE CHRISTLEY MRN: [id number] Date of Birth: [birth year]

## 2015-02-22 ENCOUNTER — Encounter: Payer: Self-pay | Admitting: Physical Therapy

## 2015-02-22 ENCOUNTER — Ambulatory Visit: Payer: Medicare Other | Admitting: Physical Therapy

## 2015-02-22 DIAGNOSIS — R262 Difficulty in walking, not elsewhere classified: Secondary | ICD-10-CM | POA: Diagnosis not present

## 2015-02-22 DIAGNOSIS — M545 Low back pain, unspecified: Secondary | ICD-10-CM

## 2015-02-22 DIAGNOSIS — M25562 Pain in left knee: Secondary | ICD-10-CM

## 2015-02-22 DIAGNOSIS — M25561 Pain in right knee: Secondary | ICD-10-CM

## 2015-02-22 DIAGNOSIS — R2681 Unsteadiness on feet: Secondary | ICD-10-CM

## 2015-02-22 NOTE — Therapy (Signed)
[place] MAIN [facility] SERVICES [address] [place], [place], [address] Phone: [phone]   Fax:  [phone]  Physical Therapy Treatment  Patient Details  Name: Cassidy [name] MRN: [id number] Date of Birth: [birth year] No Data Recorded  Encounter Date: 02/22/2015      PT End of Session - 02/22/15 1553    Visit Number 7   Number of Visits 17   PT Start Time 0340   PT Stop Time 0420   PT Time Calculation (min) 40 min   Activity Tolerance Patient tolerated treatment well      Past Medical History  Diagnosis Date  . Thyroid disease   . Anxiety   . Depression   . Hypothyroidism   . GERD (gastroesophageal reflux disease)   . Fatty liver   . Obesity     Past Surgical History  Procedure Laterality Date  . Tonsillectomy    . Appendectomy    . Esophagogastroduodenoscopy (egd) with propofol N/A 12/20/2014    Procedure: ESOPHAGOGASTRODUODENOSCOPY (EGD) WITH PROPOFOL;  Surgeon: Manya Silvas, MD;  Location: [place];  Service: Endoscopy;  Laterality: N/A;  . Savory dilation N/A 12/20/2014    Procedure: SAVORY DILATION;  Surgeon: Manya Silvas, MD;  Location: [facility] ENDOSCOPY;  Service: Endoscopy;  Laterality: N/A;    There were no vitals filed for this visit.  Visit Diagnosis:  Difficulty walking  Unsteady gait  Back pain at L4-L5 level  Arthralgia of both knees      Subjective Assessment - 02/22/15 1552    Subjective Patient is not having any back pain but she feels unsteady today. She has been having left knee pain intermittent.    Patient is accompained by: Family member   Currently in Pain? No/denies      standing hip abd with YTB x 20  side stepping left and right in parallel bars 10 feet x 3 standing on blue foam with cone reaching x 20 across midline step ups from floor to 6 inch stool x 20 bilateral sit to stand x 10 marching in parallel bars x 20 stepping pattern with weight shifting fwd/bwd x 10.   Patient  demonstrates less incoordination of movement with select exercises such as rock and reach and stepping backwards. Patient responds well to verbal and tactile cues to correct form and technique.  Motor control of LE much improved.  Muscle fatigue but no major pain complaints.                           PT Education - 02/22/15 1552    Education provided Yes   Education Details HEP   Person(s) Educated Patient   Methods Explanation   Comprehension Verbalized understanding             PT Long Term Goals - 01/23/15 1722    PT LONG TERM GOAL #1   Title Patient will be independent in home exercise program to improve strength/mobility for better functional independence with ADLs 03/20/15   PT LONG TERM GOAL #2   Title Patient (> 57 years old) will complete five times sit to stand test in < 15 seconds indicating an increased LE strength and improved balance 03/20/15   PT LONG TERM GOAL #3   Title Patient will increase 10 meter walk test to >1.75ms as to improve gait speed for better community ambulation and to reduce fall risk 03/20/15   PT LONG TERM GOAL #4  Title Patient will tolerate 5 seconds of single leg stance without loss of balance to improve ability to get in and out of shower safely 03/20/15               Plan - 02/22/15 1556    Clinical Impression Statement Patient is able to perform his exercises with modeling and verbal correction and cueing to step in the correct position   Pt will benefit from skilled therapeutic intervention in order to improve on the following deficits Abnormal gait;Obesity;Decreased activity tolerance;Decreased knowledge of use of DME;Pain;Difficulty walking;Decreased mobility;Decreased balance;Decreased cognition;Decreased range of motion;Impaired flexibility   Rehab Potential Good   PT Frequency 2x / week   PT Duration 8 weeks   PT Treatment/Interventions Electrical Stimulation;Moist Heat;Balance  training;Therapeutic exercise;Therapeutic activities;Functional mobility training;Stair training;Gait training;DME Instruction;Neuromuscular re-education;Patient/family education;Manual techniques;Passive range of motion   Consulted and Agree with Plan of Care Patient;Family member/caregiver        Problem List Patient Active Problem List   Diagnosis Date Noted  . Absolute anemia 11/27/2014  . Arthritis 11/27/2014  . Acid reflux 11/27/2014  . Depression, major, recurrent, in partial remission ([place]) 11/27/2014  . Encounter for general adult medical examination without abnormal findings 08/25/2014  . Chronic LBP 08/15/2014  . Complete rotator cuff rupture of left shoulder 05/01/2014  . Infraspinatus tenosynovitis 05/01/2014  . Difficulty in walking 11/30/2013  . Extreme obesity ([place]) 11/30/2013  . Arthritis, degenerative 10/01/2013  . Steatohepatitis 10/01/2013  . Orthostasis 10/01/2013  . 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 Fall 09/26/2013  . Dizziness 09/07/2013  . Osteoporosis with fracture 09/06/2013  . Clinical depression 03/25/2012  . Adult hypothyroidism 03/25/2012  . Esophageal stenosis 07/03/2009  . Back ache 03/08/2003  . Anxiety state 12/27/2002    Alanson Puls 02/22/2015, 3:58 PM  [place] MAIN [facility] SERVICES [address] [place], [place], [address] Phone: [phone]   Fax:  [phone]  Name: Cassidy [name] MRN: [id number] Date of Birth: May 28, 1940

## 2015-02-27 ENCOUNTER — Encounter: Payer: Self-pay | Admitting: Physical Therapy

## 2015-02-27 ENCOUNTER — Ambulatory Visit: Payer: Self-pay | Admitting: Psychiatry

## 2015-02-27 ENCOUNTER — Ambulatory Visit: Payer: Medicare Other | Admitting: Physical Therapy

## 2015-02-27 DIAGNOSIS — M545 Low back pain, unspecified: Secondary | ICD-10-CM

## 2015-02-27 DIAGNOSIS — R262 Difficulty in walking, not elsewhere classified: Secondary | ICD-10-CM

## 2015-02-27 DIAGNOSIS — R2681 Unsteadiness on feet: Secondary | ICD-10-CM

## 2015-02-27 DIAGNOSIS — M25562 Pain in left knee: Secondary | ICD-10-CM

## 2015-02-27 DIAGNOSIS — M25561 Pain in right knee: Secondary | ICD-10-CM

## 2015-02-27 NOTE — Therapy (Signed)
[place] MAIN [facility] SERVICES [address] [place], [place], [address] Phone: [phone]   Fax:  [phone]  Physical Therapy Treatment  Patient Details  Name: Cassidy [name] MRN: [id number] Date of Birth: [birth year] No Data Recorded  Encounter Date: 02/27/2015      PT End of Session - 02/27/15 1529    Visit Number 8   Number of Visits 17   PT Start Time 0315   PT Stop Time 0400   PT Time Calculation (min) 45 min   Activity Tolerance Patient tolerated treatment well      Past Medical History  Diagnosis Date  . Thyroid disease   . Anxiety   . Depression   . Hypothyroidism   . GERD (gastroesophageal reflux disease)   . Fatty liver   . Obesity     Past Surgical History  Procedure Laterality Date  . Tonsillectomy    . Appendectomy    . Esophagogastroduodenoscopy (egd) with propofol N/A 12/20/2014    Procedure: ESOPHAGOGASTRODUODENOSCOPY (EGD) WITH PROPOFOL;  Surgeon: Manya Silvas, MD;  Location: [place];  Service: Endoscopy;  Laterality: N/A;  . Savory dilation N/A 12/20/2014    Procedure: SAVORY DILATION;  Surgeon: Manya Silvas, MD;  Location: [facility] ENDOSCOPY;  Service: Endoscopy;  Laterality: N/A;    There were no vitals filed for this visit.  Visit Diagnosis:  Difficulty walking  Unsteady gait  Back pain at L4-L5 level  Arthralgia of both knees      Subjective Assessment - 02/27/15 1528    Subjective Patient is not having any back pain but is having left hip pain and she feels unsteady today. She has been having left knee pain intermittent.    Patient is accompained by: Family member   Currently in Pain? Yes   Pain Score 10-Worst pain ever   Pain Location Hip   Pain Orientation Left   Pain Descriptors / Indicators Aching         standing hip abd with YTB x 20  side stepping left and right in parallel bars 10 feet x 3 standing on blue foam with cone reaching x 20 across midline step ups from  floor to 6 inch stool x 20 bilateral sit to stand x 10 marching in parallel bars x 20 stepping pattern with weight shifting fwd/bwd x 10.  Min cueing needed to appropriately perform tasks with leg, hand, and head position. Decreased coordination demonstrated requiring consistent verbal cueing to correct form. Cognitive understanding of task was delayed. Patient continues to demonstrate some in coordination of movement with select exercises. Patient responds well to verbal and tactile cues to correct form and technique.  CGA to SBA for safety with activities.  Uses to increase intensity and amplitude of movements throughout session                         PT Education - 02/27/15 1528    Education provided Yes   Education Details HEP   Person(s) Educated Patient   Methods Explanation   Comprehension Verbalized understanding             PT Long Term Goals - 01/23/15 1722    PT LONG TERM GOAL #1   Title Patient will be independent in home exercise program to improve strength/mobility for better functional independence with ADLs 03/20/15   PT LONG TERM GOAL #2   Title Patient (> 32 years old) will complete five times sit  to stand test in < 15 seconds indicating an increased LE strength and improved balance 03/20/15   PT LONG TERM GOAL #3   Title Patient will increase 10 meter walk test to >1.8ms as to improve gait speed for better community ambulation and to reduce fall risk 03/20/15   PT LONG TERM GOAL #4   Title Patient will tolerate 5 seconds of single leg stance without loss of balance to improve ability to get in and out of shower safely 03/20/15               Plan - 02/27/15 1529    Clinical Impression Statement PT provided min - moderate verbal instruction to improve set up, proper use of LE, and improved posture and gait mechanics. Patient responded moderately to instruction   Pt will benefit from skilled therapeutic intervention in order to improve  on the following deficits Abnormal gait;Obesity;Decreased activity tolerance;Decreased knowledge of use of DME;Pain;Difficulty walking;Decreased mobility;Decreased balance;Decreased cognition;Decreased range of motion;Impaired flexibility   Rehab Potential Good   PT Frequency 2x / week   PT Duration 8 weeks   PT Treatment/Interventions Electrical Stimulation;Moist Heat;Balance training;Therapeutic exercise;Therapeutic activities;Functional mobility training;Stair training;Gait training;DME Instruction;Neuromuscular re-education;Patient/family education;Manual techniques;Passive range of motion   Consulted and Agree with Plan of Care Patient;Family member/caregiver        Problem List Patient Active Problem List   Diagnosis Date Noted  . Absolute anemia 11/27/2014  . Arthritis 11/27/2014  . Acid reflux 11/27/2014  . Depression, major, recurrent, in partial remission ([place] 11/27/2014  . Encounter for general adult medical examination without abnormal findings 08/25/2014  . Chronic LBP 08/15/2014  . Complete rotator cuff rupture of left shoulder 05/01/2014  . Infraspinatus tenosynovitis 05/01/2014  . Difficulty in walking 11/30/2013  . Extreme obesity ([place] 11/30/2013  . Arthritis, degenerative 10/01/2013  . Steatohepatitis 10/01/2013  . Orthostasis 10/01/2013  . 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 Fall 09/26/2013  . Dizziness 09/07/2013  . Osteoporosis with fracture 09/06/2013  . Clinical depression 03/25/2012  . Adult hypothyroidism 03/25/2012  . Esophageal stenosis 07/03/2009  . Back ache 03/08/2003  . Anxiety state 12/27/2002    MAlanson Puls11/11/2014, 3:31 PM  [place]SERV[facility] [address] [address]Phone: [phone]  Fax:  [phone] Name: Cassidy SAFRANMRN: [id number] of Birth: 707/12/1940

## 2015-02-28 ENCOUNTER — Ambulatory Visit: Payer: Medicare Other | Admitting: Psychiatry

## 2015-02-28 ENCOUNTER — Other Ambulatory Visit: Payer: Self-pay | Admitting: Psychiatry

## 2015-02-28 MED ORDER — QUETIAPINE FUMARATE 400 MG PO TABS: 400.0000 mg | ORAL_TABLET | Freq: Every day | ORAL | Status: DC

## 2015-02-28 MED ORDER — ESCITALOPRAM OXALATE 20 MG PO TABS: 20.0000 mg | ORAL_TABLET | Freq: Every day | ORAL | Status: DC

## 2015-02-28 MED ORDER — BUSPIRONE HCL 15 MG PO TABS: 15.0000 mg | ORAL_TABLET | Freq: Two times a day (BID) | ORAL | Status: DC

## 2015-02-28 MED ORDER — TRAZODONE HCL 150 MG PO TABS: 150.0000 mg | ORAL_TABLET | Freq: Every day | ORAL | Status: DC

## 2015-02-28 NOTE — Telephone Encounter (Signed)
Patient showed up 15 minutes late for a 15 minute appointment. She was not able to be seen today. I will refill her medications with enough to get her until her rescheduled appointment which occur on 03/12/2015.

## 2015-03-01 ENCOUNTER — Encounter: Payer: Self-pay | Admitting: Physical Therapy

## 2015-03-01 ENCOUNTER — Ambulatory Visit: Payer: Medicare Other | Admitting: Physical Therapy

## 2015-03-01 DIAGNOSIS — M545 Low back pain, unspecified: Secondary | ICD-10-CM

## 2015-03-01 DIAGNOSIS — M25561 Pain in right knee: Secondary | ICD-10-CM

## 2015-03-01 DIAGNOSIS — R262 Difficulty in walking, not elsewhere classified: Secondary | ICD-10-CM

## 2015-03-01 DIAGNOSIS — R2681 Unsteadiness on feet: Secondary | ICD-10-CM

## 2015-03-01 DIAGNOSIS — M25562 Pain in left knee: Secondary | ICD-10-CM

## 2015-03-01 NOTE — Therapy (Signed)
[place] MAIN [facility] SERVICES [address] [place], [place], [address] Phone: [phone]   Fax:  [phone]  Physical Therapy Treatment  Patient Details  Name: Cassidy [name] MRN: [id number] Date of Birth: 10/15/1940 No Data Recorded  Encounter Date: 03/01/2015      PT End of Session - 03/01/15 1523    Visit Number 9   Number of Visits 17   PT Start Time 0315   PT Stop Time 0400   PT Time Calculation (min) 45 min   Activity Tolerance Patient tolerated treatment well      Past Medical History  Diagnosis Date  . Thyroid disease   . Anxiety   . Depression   . Hypothyroidism   . GERD (gastroesophageal reflux disease)   . Fatty liver   . Obesity     Past Surgical History  Procedure Laterality Date  . Tonsillectomy    . Appendectomy    . Esophagogastroduodenoscopy (egd) with propofol N/A 12/20/2014    Procedure: ESOPHAGOGASTRODUODENOSCOPY (EGD) WITH PROPOFOL;  Surgeon: Manya Silvas, MD;  Location: [place];  Service: Endoscopy;  Laterality: N/A;  . Savory dilation N/A 12/20/2014    Procedure: SAVORY DILATION;  Surgeon: Manya Silvas, MD;  Location: [facility] ENDOSCOPY;  Service: Endoscopy;  Laterality: N/A;    There were no vitals filed for this visit.  Visit Diagnosis:  Difficulty walking  Unsteady gait  Back pain at L4-L5 level  Arthralgia of both knees      Subjective Assessment - 03/01/15 1522    Subjective Patient is not having any back pain but is having left hip pain and she feels unsteady today. She has been having left knee pain intermittent.    Patient is accompained by: Family member   Currently in Pain? Yes   Pain Score 4         Neuromuscular training: Blue foam with head turns left and right, catching a ball, tandem stand on blue foam and modified tandem on blue foam, cone reaching on foam 1/2 foam with static stand,  Walking on blue foam balance beam, side stepping on blue foam balance ,  balloon tapping on blue foam 4 square side stepping, fwd/bwd stepping, diagonal stepping left and diagonal stepping right Min cueing needed to appropriately perform  tasks with leg, hand, and head position. Decreased coordination demonstrated requiring consistent verbal cueing to correct form. Cognitive understanding of task was delayed. Patient continues to demonstrate some in coordination of movement with select exercises such as stepping backwards. Patient responds well to verbal and tactile cues to correct form and technique.  CGA to SBA for safety with activities.  Uses to increase intensity and amplitude of movements throughout session                          PT Education - 03/01/15 1523    Education provided Yes   Education Details HEP   Person(s) Educated Patient   Methods Explanation   Comprehension Verbalized understanding             PT Long Term Goals - 01/23/15 1722    PT LONG TERM GOAL #1   Title Patient will be independent in home exercise program to improve strength/mobility for better functional independence with ADLs 03/20/15   PT LONG TERM GOAL #2   Title Patient (> 38 years old) will complete five times sit to stand test in < 15 seconds indicating an increased  LE strength and improved balance 03/20/15   PT LONG TERM GOAL #3   Title Patient will increase 10 meter walk test to >1.36ms as to improve gait speed for better community ambulation and to reduce fall risk 03/20/15   PT LONG TERM GOAL #4   Title Patient will tolerate 5 seconds of single leg stance without loss of balance to improve ability to get in and out of shower safely 03/20/15               Plan - 03/01/15 1523    Clinical Impression Statement CGA and Min to mod verbal cues used throughout with increased in postural sway and LOB most seen with narrow base of support and while on uneven surfaces. Continues to have balance deficits typical with diagnosis. Patient performs  intermediate level exercises without pain behaviors and needs verbal cuing for postural alignment and head positioning Tactile cues and assistance needed to keep lower leg and knee in neutral to avoid compensations with ankle motion        Problem List Patient Active Problem List   Diagnosis Date Noted  . Absolute anemia 11/27/2014  . Arthritis 11/27/2014  . Acid reflux 11/27/2014  . Depression, major, recurrent, in partial remission ([place] 11/27/2014  . Encounter for general adult medical examination without abnormal findings 08/25/2014  . Chronic LBP 08/15/2014  . Complete rotator cuff rupture of left shoulder 05/01/2014  . Infraspinatus tenosynovitis 05/01/2014  . Difficulty in walking 11/30/2013  . Extreme obesity ([place] 11/30/2013  . Arthritis, degenerative 10/01/2013  . Steatohepatitis 10/01/2013  . Orthostasis 10/01/2013  . 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 Fall 09/26/2013  . Dizziness 09/07/2013  . Osteoporosis with fracture 09/06/2013  . Clinical depression 03/25/2012  . Adult hypothyroidism 03/25/2012  . Esophageal stenosis 07/03/2009  . Back ache 03/08/2003  . Anxiety state 12/27/2002    MAlanson Puls11/01/2015, 3:39 PM  [place] [facility] MemorialMAIN RHel[facility] CenterSERVICES [address] [address]Phone: [phone]  Fax:  [phone] Name: Cassidy MAYOLMRN: [id number] of Birth: [birth year]

## 2015-03-06 ENCOUNTER — Ambulatory Visit: Payer: Medicare Other | Admitting: Physical Therapy

## 2015-03-06 ENCOUNTER — Encounter: Payer: Self-pay | Admitting: Physical Therapy

## 2015-03-06 DIAGNOSIS — M25561 Pain in right knee: Secondary | ICD-10-CM

## 2015-03-06 DIAGNOSIS — R2681 Unsteadiness on feet: Secondary | ICD-10-CM

## 2015-03-06 DIAGNOSIS — R262 Difficulty in walking, not elsewhere classified: Secondary | ICD-10-CM

## 2015-03-06 DIAGNOSIS — M545 Low back pain, unspecified: Secondary | ICD-10-CM

## 2015-03-06 DIAGNOSIS — M25562 Pain in left knee: Secondary | ICD-10-CM

## 2015-03-06 NOTE — Therapy (Signed)
[place] MAIN [facility] SERVICES [address] [place], [place], [address] Phone: [phone]   Fax:  [phone]  Physical Therapy Treatment  Patient Details  Name: Cassidy [name] MRN: [id number] Date of Birth: Jan 17, 1941 No Data Recorded  Encounter Date: 03/06/2015      PT End of Session - 03/06/15 1526    Visit Number 10   Number of Visits 17   PT Start Time 0315   PT Stop Time 0400   PT Time Calculation (min) 45 min   Activity Tolerance Patient tolerated treatment well      Past Medical History  Diagnosis Date  . Thyroid disease   . Anxiety   . Depression   . Hypothyroidism   . GERD (gastroesophageal reflux disease)   . Fatty liver   . Obesity     Past Surgical History  Procedure Laterality Date  . Tonsillectomy    . Appendectomy    . Esophagogastroduodenoscopy (egd) with propofol N/A 12/20/2014    Procedure: ESOPHAGOGASTRODUODENOSCOPY (EGD) WITH PROPOFOL;  Surgeon: Manya Silvas, MD;  Location: [place];  Service: Endoscopy;  Laterality: N/A;  . Savory dilation N/A 12/20/2014    Procedure: SAVORY DILATION;  Surgeon: Manya Silvas, MD;  Location: [facility] ENDOSCOPY;  Service: Endoscopy;  Laterality: N/A;    There were no vitals filed for this visit.  Visit Diagnosis:  Difficulty walking  Unsteady gait  Back pain at L4-L5 level  Arthralgia of both knees      Subjective Assessment - 03/06/15 1525    Subjective Patient is not having any back pain and she feels unsteady today. She has been having left knee pain intermittent.    Patient is accompained by: Family member   Currently in Pain? No/denies      Squats with 5 sec hold and 20 reps heel raises x 20 single leg  standing hip abd with YTB x 20  X 2 BLE side stepping left and right in parallel bars TB around knees  10 feet x 3 Leg press x 90 lbs x 20 x 3 Leg press with heel rases x 20 x 3 step ups from floor to 6 inch stool x 20 bilateral Eccentric step  downs x 10 BLE  Patient needs occasional verbal cueing to improve posture and cueing to correctly perform exercises slowly, holding at end of range to increase motor firing of desired muscle to encourage fatigue.                            PT Education - 03/06/15 1526    Education provided Yes   Education Details HEP   Person(s) Educated Patient   Methods Explanation   Comprehension Verbalized understanding             PT Long Term Goals - 01/23/15 1722    PT LONG TERM GOAL #1   Title Patient will be independent in home exercise program to improve strength/mobility for better functional independence with ADLs 03/20/15   PT LONG TERM GOAL #2   Title Patient (> 61 years old) will complete five times sit to stand test in < 15 seconds indicating an increased LE strength and improved balance 03/20/15   PT LONG TERM GOAL #3   Title Patient will increase 10 meter walk test to >1.22ms as to improve gait speed for better community ambulation and to reduce fall risk 03/20/15   PT LONG TERM GOAL #4  Title Patient will tolerate 5 seconds of single leg stance without loss of balance to improve ability to get in and out of shower safely 03/20/15               Plan - 03/06/15 1527    Clinical Impression Statement Continuous verbal cues and tactile cues needed to relax shoulder muscles        Problem List Patient Active Problem List   Diagnosis Date Noted  . Absolute anemia 11/27/2014  . Arthritis 11/27/2014  . Acid reflux 11/27/2014  . Depression, major, recurrent, in partial remission ([place]) 11/27/2014  . Encounter for general adult medical examination without abnormal findings 08/25/2014  . Chronic LBP 08/15/2014  . Complete rotator cuff rupture of left shoulder 05/01/2014  . Infraspinatus tenosynovitis 05/01/2014  . Difficulty in walking 11/30/2013  . Extreme obesity ([place]) 11/30/2013  . Arthritis, degenerative 10/01/2013  . Steatohepatitis  10/01/2013  . Orthostasis 10/01/2013  . 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 Fall 09/26/2013  . Dizziness 09/07/2013  . Osteoporosis with fracture 09/06/2013  . Clinical depression 03/25/2012  . Adult hypothyroidism 03/25/2012  . Esophageal stenosis 07/03/2009  . Back ache 03/08/2003  . Anxiety state 12/27/2002    Alanson Puls 03/06/2015, 3:28 PM  [place] [facility] MAIN [facility] SERVICES [address] [place], [place], [address] Phone: [phone]   Fax:  [phone]  Name: Cassidy [name] MRN: [id number] Date of Birth: 1941/03/09

## 2015-03-08 ENCOUNTER — Ambulatory Visit: Payer: Medicare Other | Admitting: Physical Therapy

## 2015-03-12 ENCOUNTER — Ambulatory Visit (INDEPENDENT_AMBULATORY_CARE_PROVIDER_SITE_OTHER): Payer: Medicare Other | Admitting: Psychiatry

## 2015-03-12 ENCOUNTER — Encounter: Payer: Self-pay | Admitting: Psychiatry

## 2015-03-12 ENCOUNTER — Encounter: Payer: Medicare Other | Admitting: Physical Therapy

## 2015-03-12 VITALS — BP 118/78 | HR 85 | Temp 98.2°F

## 2015-03-12 DIAGNOSIS — G4700 Insomnia, unspecified: Secondary | ICD-10-CM

## 2015-03-12 DIAGNOSIS — F411 Generalized anxiety disorder: Secondary | ICD-10-CM | POA: Diagnosis not present

## 2015-03-12 DIAGNOSIS — F325 Major depressive disorder, single episode, in full remission: Secondary | ICD-10-CM | POA: Insufficient documentation

## 2015-03-12 DIAGNOSIS — F331 Major depressive disorder, recurrent, moderate: Secondary | ICD-10-CM | POA: Diagnosis not present

## 2015-03-12 MED ORDER — QUETIAPINE FUMARATE 400 MG PO TABS: 400.0000 mg | ORAL_TABLET | Freq: Every day | ORAL | Status: DC

## 2015-03-12 MED ORDER — TRAZODONE HCL 150 MG PO TABS: 150.0000 mg | ORAL_TABLET | Freq: Every day | ORAL | Status: DC

## 2015-03-12 MED ORDER — BUSPIRONE HCL 15 MG PO TABS: 15.0000 mg | ORAL_TABLET | Freq: Two times a day (BID) | ORAL | Status: DC

## 2015-03-12 MED ORDER — ESCITALOPRAM OXALATE 10 MG PO TABS: 10.0000 mg | ORAL_TABLET | Freq: Every day | ORAL | Status: DC

## 2015-03-12 MED ORDER — ESCITALOPRAM OXALATE 20 MG PO TABS: 20.0000 mg | ORAL_TABLET | Freq: Every day | ORAL | Status: DC

## 2015-03-12 NOTE — Progress Notes (Signed)
[place] MD/PA/NP OP Progress Note  03/12/2015 1:48 PM Cassidy [name]  MRN:  [id number]  Subjective:  Patient returns for follow-up of her major depressive disorder, generalized anxiety disorder, panic disorder and insomnia. Today patient presents with her youngest sister. Sr. indicates that the issue right now is being forgetful at times. Sister  implies that it might be related to the patient's medications as they've had a discussion with the patient's primary care doctor was concerned about the number of serotonin medications the patient was on. Patient also reports her appetite has not been good.  There does not appear to be any issues with profound depression. There is discussion that attempts to lower medications in the past 7 result in the patient called requesting that medications be increased back to previous amounts. For example by state that this Seroquel was cut to 200 mg at bedtime and the patient indicated she could not sleep and thus the dose was raised back to 400 mg at bedtime.  Assess today the plan would be to decrease her medications and thus we will start with decreasing her Lexapro from 20 mg a day to 10 mg a day. Chief Complaint:  Chief Complaint    Follow-up; Medication Refill     Visit Diagnosis:     ICD-9-CM ICD-10-CM   1. Major depressive disorder, recurrent episode, moderate (HCC) 296.32 F33.1   2. GAD (generalized anxiety disorder) 300.02 F41.1   3. Insomnia 780.52 G47.00     Past Medical History:  Past Medical History  Diagnosis Date  . Thyroid disease   . Anxiety   . Depression   . Hypothyroidism   . GERD (gastroesophageal reflux disease)   . Fatty liver   . Obesity     Past Surgical History  Procedure Laterality Date  . Tonsillectomy    . Appendectomy    . Esophagogastroduodenoscopy (egd) with propofol N/A 12/20/2014    Procedure: ESOPHAGOGASTRODUODENOSCOPY (EGD) WITH PROPOFOL;  Surgeon: Manya Silvas, MD;  Location: [place];  Service:  Endoscopy;  Laterality: N/A;  . Savory dilation N/A 12/20/2014    Procedure: SAVORY DILATION;  Surgeon: Manya Silvas, MD;  Location: [facility] ENDOSCOPY;  Service: Endoscopy;  Laterality: N/A;   Family History:  Family History  Problem Relation Age of Onset  . Diabetes Mother   . COPD Mother   . Kidney disease Mother   . Depression Mother   . Heart attack Father   . Aneurysm Father   . Anxiety disorder Sister   . Depression Sister   . Diabetes Sister   . Hypertension Sister   . Atrial fibrillation Brother   . Spinal muscular atrophy Brother   . Breast cancer Sister   . Bone cancer Sister   . Depression Sister   . Anxiety disorder Sister   . Breast cancer Sister   . Colon cancer Sister   . Depression Sister   . Diabetes Sister   . COPD Sister   . Depression Sister   . Anxiety disorder Sister   . Diabetes Brother   . Depression Brother    Social History:  Social History   Social History  . Marital Status: Widowed    Spouse Name: N/A  . Number of Children: N/A  . Years of Education: N/A   Social History Main Topics  . Smoking status: Never Smoker   . Smokeless tobacco: Never Used  . Alcohol Use: No  . Drug Use: No  . Sexual Activity: Not Currently  Birth Control/ Protection: Post-menopausal   Other Topics Concern  . None   Social History Narrative   Additional History:   Assessment:   Musculoskeletal: Strength & Muscle Tone: decreased Gait & Station: In a wheelchair today  Patient leans: N/A  Psychiatric Specialty Exam: HPI  Review of Systems  Psychiatric/Behavioral: Positive for memory loss. Negative for depression, suicidal ideas, hallucinations and substance abuse. The patient is not nervous/anxious and does not have insomnia.   All other systems reviewed and are negative.   Blood pressure 118/78, pulse 85, temperature 98.2 F (36.8 C), temperature source Tympanic, SpO2 92 %.There is no weight on file to calculate BMI.  General Appearance: Well  Groomed  Eye Contact:  Good  Speech:  Clear and Coherent  Volume:  Normal  Mood:  okay  Affect:  Appropriate and Congruent  Thought Process:  Linear  Orientation:  Full (Time, Place, and Person)  Thought Content:  Negative  Suicidal Thoughts:  No  Homicidal Thoughts:  No  Memory:  Immediate;   Good Recent;   Good Remote;   Good  Judgement:  Negative  Insight:  Good  Psychomotor Activity:  Negative  Concentration:  Good  Recall:  Good  Fund of Knowledge: Good  Language: Good  Akathisia:  Negative  Handed:  Right unknown   AIMS (if indicated):   Done today, normal  Assets:  Communication Skills Desire for Improvement Social Support  ADL's:  Intact  Cognition: WNL  Sleep:  good   Is the patient at risk to self?  No. Has the patient been a risk to self in the past 6 months?  No. Has the patient been a risk to self within the distant past?  No. Is the patient a risk to others?  No. Has the patient been a risk to others in the past 6 months?  No. Has the patient been a risk to others within the distant past?  No.  Current Medications: Current Outpatient Prescriptions  Medication Sig Dispense Refill  . aspirin EC 81 MG tablet Take 81 mg by mouth at bedtime.    Marland Kitchen aspirin-acetaminophen-caffeine (EXCEDRIN MIGRAINE) 250-250-65 MG per tablet Take by mouth every 6 (six) hours as needed for headache.    . busPIRone (BUSPAR) 15 MG tablet Take 1 tablet (15 mg total) by mouth 2 (two) times daily. 60 tablet 1  . celecoxib (CELEBREX) 100 MG capsule TAKE ONE CAPSULE BY MOUTH TWICE A DAY    . ferrous fumarate-iron polysaccharide complex (TANDEM) 162-115.2 MG CAPS Take 1 capsule by mouth at bedtime.    . fluticasone (FLONASE) 50 MCG/ACT nasal spray Place 1 spray into both nostrils daily.    Marland Kitchen levothyroxine (SYNTHROID, LEVOTHROID) 75 MCG tablet Take 75 mcg by mouth daily before breakfast.    . omeprazole (PRILOSEC) 20 MG capsule Take 20 mg by mouth 2 (two) times daily.    . QUEtiapine  (SEROQUEL) 400 MG tablet Take 1 tablet (400 mg total) by mouth at bedtime. 30 tablet 1  . traZODone (DESYREL) 150 MG tablet Take 1 tablet (150 mg total) by mouth at bedtime. 30 tablet 1  . escitalopram (LEXAPRO) 10 MG tablet Take 1 tablet (10 mg total) by mouth at bedtime. (Patient not taking: Reported on 03/12/2015) 30 tablet 1  . KLOR-CON 10 10 MEQ tablet      No current facility-administered medications for this visit.    Medical Decision Making:  Review of Medication Regimen & Side Effects (2)  Treatment Plan Summary:Medication management and  Plan   Major depressive disorder Patient has been stable on this medication for several months. We will discontinue her on her BuSpar 15 mg twice daily, quetiapine 400 mg at bedtime, trazodone 150 mg at bedtime. We will crease Lexapro from 20 mg down to 10 mg daily.  Generalized anxiety disorder-continue BuSpar 15 mg twice daily  Panic disorder-Lexapro as above  At the next visit we can discuss further decreasing BuSpar or quetiapine. Follow-up in 6 weeks. They've encouraged call any questions or concerns prior to next appointment.  [name] 03/12/2015, 1:48 PM

## 2015-03-13 ENCOUNTER — Encounter: Payer: Self-pay | Admitting: Physical Therapy

## 2015-03-13 ENCOUNTER — Ambulatory Visit: Payer: Medicare Other | Admitting: Physical Therapy

## 2015-03-13 DIAGNOSIS — R262 Difficulty in walking, not elsewhere classified: Secondary | ICD-10-CM | POA: Diagnosis not present

## 2015-03-13 DIAGNOSIS — M25562 Pain in left knee: Secondary | ICD-10-CM

## 2015-03-13 DIAGNOSIS — M545 Low back pain, unspecified: Secondary | ICD-10-CM

## 2015-03-13 DIAGNOSIS — M25561 Pain in right knee: Secondary | ICD-10-CM

## 2015-03-13 DIAGNOSIS — R2681 Unsteadiness on feet: Secondary | ICD-10-CM

## 2015-03-13 NOTE — Therapy (Signed)
[place] MAIN [facility] SERVICES [address] [place], [place], [address] Phone: [phone]   Fax:  [phone]  Physical Therapy Treatment  Patient Details  Name: Cassidy [name] MRN: [id number] Date of Birth: [birth year] No Data Recorded  Encounter Date: 03/13/2015      PT End of Session - 03/13/15 1516    Visit Number 11   Number of Visits 17   PT Start Time 0310   PT Stop Time 0350   PT Time Calculation (min) 40 min   Activity Tolerance Patient tolerated treatment well      Past Medical History  Diagnosis Date  . Thyroid disease   . Anxiety   . Depression   . Hypothyroidism   . GERD (gastroesophageal reflux disease)   . Fatty liver   . Obesity     Past Surgical History  Procedure Laterality Date  . Tonsillectomy    . Appendectomy    . Esophagogastroduodenoscopy (egd) with propofol N/A 12/20/2014    Procedure: ESOPHAGOGASTRODUODENOSCOPY (EGD) WITH PROPOFOL;  Surgeon: Manya Silvas, MD;  Location: [place];  Service: Endoscopy;  Laterality: N/A;  . Savory dilation N/A 12/20/2014    Procedure: SAVORY DILATION;  Surgeon: Manya Silvas, MD;  Location: [facility] ENDOSCOPY;  Service: Endoscopy;  Laterality: N/A;    There were no vitals filed for this visit.  Visit Diagnosis:  Difficulty walking  Unsteady gait  Back pain at L4-L5 level  Arthralgia of both knees      Subjective Assessment - 03/13/15 1515    Subjective Patient is not having any back pain and she feels unsteady today. She has been having left knee pain intermittent.    Patient is accompained by: Family member   Currently in Pain? No/denies        standing hip abd with YTB x 20  side stepping left and right in parallel bars 10 feet x 3 standing on blue foam with cone reaching x 20 across midline step ups from floor to 6 inch stool x 20 bilateral marching in parallel bars x 20 stepping pattern with weight shifting fwd/bwd x 10.  Heel raises from 2  feet  Sit to stand training x5 repetitions for 3 sets Feet together on blue foam and holding ball x 1 minute, holding ball and fwd and bwd movement elbow flex/ext x 20, horizontal abd/add with ball and arms extended. PT provided min - moderate verbal instruction to improve set up, proper use of LE, and improved posture and gait mechanics. Patient responded moderately to instruction                          PT Education - 03/13/15 1515    Education provided Yes   Education Details HEP   Person(s) Educated Patient   Methods Explanation   Comprehension Verbalized understanding             PT Long Term Goals - 01/23/15 1722    PT LONG TERM GOAL #1   Title Patient will be independent in home exercise program to improve strength/mobility for better functional independence with ADLs 03/20/15   PT LONG TERM GOAL #2   Title Patient (> 43 years old) will complete five times sit to stand test in < 15 seconds indicating an increased LE strength and improved balance 03/20/15   PT LONG TERM GOAL #3   Title Patient will increase 10 meter walk test to >1.1ms as to improve  gait speed for better community ambulation and to reduce fall risk 03/20/15   PT LONG TERM GOAL #4   Title Patient will tolerate 5 seconds of single leg stance without loss of balance to improve ability to get in and out of shower safely 03/20/15               Plan - 03/13/15 1516    Clinical Impression Statement Fatigue with sit to stand but demonstrating more control, Increase weight for standing exercises. Fatigue still evident with nu-step and endurance   Pt will benefit from skilled therapeutic intervention in order to improve on the following deficits Abnormal gait;Obesity;Decreased activity tolerance;Decreased knowledge of use of DME;Pain;Difficulty walking;Decreased mobility;Decreased balance;Decreased cognition;Decreased range of motion;Impaired flexibility   Rehab Potential Good   PT  Frequency 2x / week   PT Duration 8 weeks   PT Treatment/Interventions Electrical Stimulation;Moist Heat;Balance training;Therapeutic exercise;Therapeutic activities;Functional mobility training;Stair training;Gait training;DME Instruction;Neuromuscular re-education;Patient/family education;Manual techniques;Passive range of motion        Problem List Patient Active Problem List   Diagnosis Date Noted  . Major depression in remission ([place]) 03/12/2015  . Gonalgia 01/05/2015  . Absolute anemia 11/27/2014  . Arthritis 11/27/2014  . Acid reflux 11/27/2014  . Depression, major, recurrent, in partial remission ([place]) 11/27/2014  . Encounter for general adult medical examination without abnormal findings 08/25/2014  . Chronic LBP 08/15/2014  . Complete rotator cuff rupture of left shoulder 05/01/2014  . Infraspinatus tenosynovitis 05/01/2014  . Other synovitis and tenosynovitis, right shoulder 05/01/2014  . Difficulty in walking 11/30/2013  . Extreme obesity ([place]) 11/30/2013  . Morbid obesity ([place]) 11/30/2013  . Arthritis, degenerative 10/01/2013  . Steatohepatitis 10/01/2013  . Orthostasis 10/01/2013  . 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 Fall 09/26/2013  . Dizziness 09/07/2013  . Osteoporosis with fracture 09/06/2013  . Clinical depression 03/25/2012  . Adult hypothyroidism 03/25/2012  . Esophageal stenosis 07/03/2009  . Back ache 03/08/2003  . Anxiety state 12/27/2002    Alanson Puls 03/13/2015, 3:18 PM  [place] MAIN [facility] SERVICES [address] [place], [place], [address] Phone: [phone]   Fax:  [phone]  Name: Cassidy [name] MRN: [id number] Date of Birth: Feb 12, 1941

## 2015-03-19 ENCOUNTER — Ambulatory Visit: Payer: Medicare Other | Admitting: Physical Therapy

## 2015-03-19 DIAGNOSIS — M545 Low back pain, unspecified: Secondary | ICD-10-CM

## 2015-03-19 DIAGNOSIS — R2681 Unsteadiness on feet: Secondary | ICD-10-CM

## 2015-03-19 DIAGNOSIS — M25562 Pain in left knee: Secondary | ICD-10-CM

## 2015-03-19 DIAGNOSIS — M25561 Pain in right knee: Secondary | ICD-10-CM

## 2015-03-19 DIAGNOSIS — R262 Difficulty in walking, not elsewhere classified: Secondary | ICD-10-CM

## 2015-03-19 NOTE — Therapy (Signed)
[place] MAIN [facility] SERVICES [address] [place], [place], [address] Phone: [phone]   Fax:  [phone]  Physical Therapy Treatment  Patient Details  Name: Cassidy [name] MRN: [id number] Date of Birth: [birth year] No Data Recorded  Encounter Date: 03/19/2015    Past Medical History  Diagnosis Date  . Thyroid disease   . Anxiety   . Depression   . Hypothyroidism   . GERD (gastroesophageal reflux disease)   . Fatty liver   . Obesity     Past Surgical History  Procedure Laterality Date  . Tonsillectomy    . Appendectomy    . Esophagogastroduodenoscopy (egd) with propofol N/A 12/20/2014    Procedure: ESOPHAGOGASTRODUODENOSCOPY (EGD) WITH PROPOFOL;  Surgeon: Manya Silvas, MD;  Location: [place];  Service: Endoscopy;  Laterality: N/A;  . Savory dilation N/A 12/20/2014    Procedure: SAVORY DILATION;  Surgeon: Manya Silvas, MD;  Location: [facility] ENDOSCOPY;  Service: Endoscopy;  Laterality: N/A;    There were no vitals filed for this visit.  Visit Diagnosis:  Difficulty walking  Unsteady gait  Back pain at L4-L5 level  Arthralgia of both knees     Patient took some medicine that is causing her to not feel up to doing any therapy today. She was not seen due to patient request after arrival to clinic.                                PT Long Term Goals - 01/23/15 1722    PT LONG TERM GOAL #1   Title Patient will be independent in home exercise program to improve strength/mobility for better functional independence with ADLs 03/20/15   PT LONG TERM GOAL #2   Title Patient (> 14 years old) will complete five times sit to stand test in < 15 seconds indicating an increased LE strength and improved balance 03/20/15   PT LONG TERM GOAL #3   Title Patient will increase 10 meter walk test to >1.54ms as to improve gait speed for better community ambulation and to reduce fall risk 03/20/15   PT LONG  TERM GOAL #4   Title Patient will tolerate 5 seconds of single leg stance without loss of balance to improve ability to get in and out of shower safely 03/20/15               Problem List Patient Active Problem List   Diagnosis Date Noted  . Major depression in remission ([place] 03/12/2015  . Gonalgia 01/05/2015  . Absolute anemia 11/27/2014  . Arthritis 11/27/2014  . Acid reflux 11/27/2014  . Depression, major, recurrent, in partial remission ([place] 11/27/2014  . Encounter for general adult medical examination without abnormal findings 08/25/2014  . Chronic LBP 08/15/2014  . Complete rotator cuff rupture of left shoulder 05/01/2014  . Infraspinatus tenosynovitis 05/01/2014  . Other synovitis and tenosynovitis, right shoulder 05/01/2014  . Difficulty in walking 11/30/2013  . Extreme obesity ([place] 11/30/2013  . Morbid obesity ([place] 11/30/2013  . Arthritis, degenerative 10/01/2013  . Steatohepatitis 10/01/2013  . Orthostasis 10/01/2013  . 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 Fall 09/26/2013  . Dizziness 09/07/2013  . Osteoporosis with fracture 09/06/2013  . Clinical depression 03/25/2012  . Adult hypothyroidism 03/25/2012  . Esophageal stenosis 07/03/2009  . Back ache 03/08/2003  . Anxiety state 12/27/2002    MArelia SneddonS 03/19/2015, 3:39 PM  [facility]  MAIN [facility] SERVICES [place], [place], [address] Phone: [phone]   Fax:  [phone]  Name: Cassidy [name] MRN: [id number] Date of Birth: [birth year]

## 2015-03-20 DIAGNOSIS — R413 Other amnesia: Secondary | ICD-10-CM | POA: Insufficient documentation

## 2015-03-21 ENCOUNTER — Ambulatory Visit: Payer: Medicare Other | Admitting: Physical Therapy

## 2015-03-27 ENCOUNTER — Encounter: Payer: Medicare Other | Admitting: Physical Therapy

## 2015-03-29 ENCOUNTER — Encounter: Payer: Medicare Other | Admitting: Physical Therapy

## 2015-04-03 ENCOUNTER — Encounter: Payer: Medicare Other | Admitting: Physical Therapy

## 2015-04-05 ENCOUNTER — Encounter: Payer: Medicare Other | Admitting: Physical Therapy

## 2015-04-10 ENCOUNTER — Encounter: Payer: Medicare Other | Admitting: Physical Therapy

## 2015-04-12 ENCOUNTER — Encounter: Payer: Medicare Other | Admitting: Physical Therapy

## 2015-04-17 ENCOUNTER — Encounter: Payer: Medicare Other | Admitting: Physical Therapy

## 2015-04-19 ENCOUNTER — Encounter: Payer: Medicare Other | Admitting: Physical Therapy

## 2015-04-25 ENCOUNTER — Encounter: Payer: Self-pay | Admitting: Psychiatry

## 2015-04-25 ENCOUNTER — Ambulatory Visit (INDEPENDENT_AMBULATORY_CARE_PROVIDER_SITE_OTHER): Payer: Medicare Other | Admitting: Psychiatry

## 2015-04-25 VITALS — BP 126/78 | HR 86 | Temp 98.3°F | Ht 60.0 in | Wt 203.6 lb

## 2015-04-25 DIAGNOSIS — G4700 Insomnia, unspecified: Secondary | ICD-10-CM

## 2015-04-25 DIAGNOSIS — F411 Generalized anxiety disorder: Secondary | ICD-10-CM | POA: Diagnosis not present

## 2015-04-25 DIAGNOSIS — F331 Major depressive disorder, recurrent, moderate: Secondary | ICD-10-CM

## 2015-04-25 MED ORDER — TRAZODONE HCL 150 MG PO TABS: 150.0000 mg | ORAL_TABLET | Freq: Every day | ORAL | Status: DC

## 2015-04-25 MED ORDER — HYDROXYZINE PAMOATE 25 MG PO CAPS: 25.0000 mg | ORAL_CAPSULE | Freq: Three times a day (TID) | ORAL | Status: DC | PRN

## 2015-04-25 MED ORDER — ESCITALOPRAM OXALATE 10 MG PO TABS: 10.0000 mg | ORAL_TABLET | Freq: Every day | ORAL | Status: DC

## 2015-04-25 MED ORDER — BUSPIRONE HCL 15 MG PO TABS: 15.0000 mg | ORAL_TABLET | Freq: Two times a day (BID) | ORAL | Status: DC

## 2015-04-25 NOTE — Progress Notes (Signed)
BH MD/PA/NP OP Progress Note  04/25/2015 3:33 PM Cassidy [name]  MRN:  [id number]  Subjective:  Patient returns for follow-up of her major depressive disorder, generalized anxiety disorder, panic disorder and insomnia. The patient presents with her younger sister. They indicate that they the patient's neurologist he was concerned with serotonin and cut her Seroquel dose from 400 mg down to 300 mg. Per the patient's younger sister the patient never followed these instructions and continued to take the 400 mg however the sister corrected the issue and the patient started the lower dose of 300 yesterday. In addition they state the neurologist started her on a medicine for memory. They indicated that the word Aricept or donepezil sounded familiar. I did discuss with them that it was best that I manage these medications so we can keep track of what is occurring. Patient's younger sister indicates the neurologist as well as the patient's primary care have concerns about the amount of serotonin in her system. At the last visit we decreased her Lexapro.  She reports her main complaint right now is "nerves." Patient does states she is down because she lost her husband several years ago. I spent time trying to encourage patient to perhaps pursue some therapy however patient is adamant against this. Patient's younger sister states that she wants patient to begin to be more active in physical therapy and believes that would also help the patient mentally.  I discussed that if the other providers are concerned with serotonin that I would not start another antidepressant but rather we would continue on the Lexapro.   Chief Complaint:  Chief Complaint    Follow-up; Medication Refill     Visit Diagnosis:     ICD-9-CM ICD-10-CM   1. Major depressive disorder, recurrent episode, moderate (HCC) 296.32 F33.1   2. GAD (generalized anxiety disorder) 300.02 F41.1   3. Insomnia 780.52 G47.00     Past Medical History:   Past Medical History  Diagnosis Date  . Thyroid disease   . Anxiety   . Depression   . Hypothyroidism   . GERD (gastroesophageal reflux disease)   . Fatty liver   . Obesity     Past Surgical History  Procedure Laterality Date  . Tonsillectomy    . Appendectomy    . Esophagogastroduodenoscopy (egd) with propofol N/A 12/20/2014    Procedure: ESOPHAGOGASTRODUODENOSCOPY (EGD) WITH PROPOFOL;  Surgeon: Manya Silvas, MD;  Location: [place];  Service: Endoscopy;  Laterality: N/A;  . Savory dilation N/A 12/20/2014    Procedure: SAVORY DILATION;  Surgeon: Manya Silvas, MD;  Location: [facility] ENDOSCOPY;  Service: Endoscopy;  Laterality: N/A;   Family History:  Family History  Problem Relation Age of Onset  . Diabetes Mother   . COPD Mother   . Kidney disease Mother   . Depression Mother   . Heart attack Father   . Aneurysm Father   . Anxiety disorder Sister   . Depression Sister   . Diabetes Sister   . Hypertension Sister   . Atrial fibrillation Brother   . Spinal muscular atrophy Brother   . Breast cancer Sister   . Bone cancer Sister   . Depression Sister   . Anxiety disorder Sister   . Breast cancer Sister   . Colon cancer Sister   . Depression Sister   . Diabetes Sister   . COPD Sister   . Depression Sister   . Anxiety disorder Sister   . Diabetes Brother   . Depression  Brother    Social History:  Social History   Social History  . Marital Status: Widowed    Spouse Name: N/A  . Number of Children: N/A  . Years of Education: N/A   Social History Main Topics  . Smoking status: Never Smoker   . Smokeless tobacco: Never Used  . Alcohol Use: No  . Drug Use: No  . Sexual Activity: Not Currently    Birth Control/ Protection: Post-menopausal   Other Topics Concern  . None   Social History Narrative   Additional History:   Assessment:   Musculoskeletal: Strength & Muscle Tone: decreased Gait & Station: In a wheelchair today  Patient leans:  N/A  Psychiatric Specialty Exam: HPI  Review of Systems  Psychiatric/Behavioral: Positive for memory loss. Negative for depression, suicidal ideas, hallucinations and substance abuse. The patient is nervous/anxious. The patient does not have insomnia.   All other systems reviewed and are negative.   Blood pressure 126/78, pulse 86, temperature 98.3 F (36.8 C), temperature source Tympanic, height 5' (1.524 m), weight 203 lb 9.6 oz (92.352 kg), SpO2 91 %.Body mass index is 39.76 kg/(m^2).  General Appearance: Well Groomed  Eye Contact:  Good  Speech:  Clear and Coherent  Volume:  Normal  Mood:  not so good today  Affect:  Depressed  Thought Process:  Linear  Orientation:  Full (Time, Place, and Person)  Thought Content:  Negative  Suicidal Thoughts:  No  Homicidal Thoughts:  No  Memory:  Immediate;   Good Recent;   Good Remote;   Good  Judgement:  Negative  Insight:  Good  Psychomotor Activity:  Negative  Concentration:  Good  Recall:  Good  Fund of Knowledge: Good  Language: Good  Akathisia:  Negative  Handed:  Right unknown   AIMS (if indicated):   Done today, normal  Assets:  Communication Skills Desire for Improvement Social Support  ADL's:  Intact  Cognition: WNL  Sleep:  good   Is the patient at risk to self?  No. Has the patient been a risk to self in the past 6 months?  No. Has the patient been a risk to self within the distant past?  No. Is the patient a risk to others?  No. Has the patient been a risk to others in the past 6 months?  No. Has the patient been a risk to others within the distant past?  No.  Current Medications: Current Outpatient Prescriptions  Medication Sig Dispense Refill  . busPIRone (BUSPAR) 15 MG tablet Take 1 tablet (15 mg total) by mouth 2 (two) times daily. 60 tablet 4  . celecoxib (CELEBREX) 100 MG capsule TAKE ONE CAPSULE BY MOUTH TWICE A DAY    . escitalopram (LEXAPRO) 10 MG tablet Take 1 tablet (10 mg total) by mouth at  bedtime. 30 tablet 4  . ferrous fumarate-iron polysaccharide complex (TANDEM) 162-115.2 MG CAPS Take 1 capsule by mouth at bedtime.    . fluticasone (FLONASE) 50 MCG/ACT nasal spray Place 1 spray into both nostrils daily.    Marland Kitchen KLOR-CON 10 10 MEQ tablet     . levothyroxine (SYNTHROID, LEVOTHROID) 75 MCG tablet Take 75 mcg by mouth daily before breakfast.    . omeprazole (PRILOSEC) 20 MG capsule Take 20 mg by mouth 2 (two) times daily.    . QUEtiapine (SEROQUEL) 200 MG tablet Take by mouth.    . traZODone (DESYREL) 150 MG tablet Take 1 tablet (150 mg total) by mouth at bedtime. 30 tablet  4  . hydrOXYzine (VISTARIL) 25 MG capsule Take 1 capsule (25 mg total) by mouth 3 (three) times daily as needed for anxiety. 30 capsule 1  . QUEtiapine (SEROQUEL) 400 MG tablet Take 1 tablet (400 mg total) by mouth at bedtime. (Patient not taking: Reported on 04/25/2015) 30 tablet 1   No current facility-administered medications for this visit.    Medical Decision Making:  Review of Medication Regimen & Side Effects (2)  Treatment Plan Summary:Medication management and Plan   Major depressive disorder-continue Lexapro 10 mg daily. Patient's neurologist has decreased Seroquel from 400 mg bedtime to 300 mg at bedtime. According to younger sister they are cutting a 200 mg tablet in half and taking 1-1/2 tablets. Per younger sister they are he have a prescription for this medication.  Generalized anxiety disorder-continue BuSpar 15 mg twice daily. She does state she feels this medication has been helpful to her.  Panic disorder-Lexapro as above. I have written Vistaril 25 mg 3 times a day as needed for anxiety. Risk and benefits of been discussing patient's able to consent.  I recommended the patient engage in therapy as a lot of her feelings and issues such as limited physical mobility are chronic and it might be helpful to develop some supports and coping skills. However as noted above patient declines this  option.  She'll follow up in 4 weeks with me. I made them aware of my departure from this clinic and that they will be able to continue to follow with another provider in the clinic. They've encouraged call any questions or concerns prior to next appointment.  [name] 04/25/2015, 3:33 PM

## 2015-05-24 ENCOUNTER — Encounter: Payer: Self-pay | Admitting: Psychiatry

## 2015-05-24 ENCOUNTER — Ambulatory Visit (INDEPENDENT_AMBULATORY_CARE_PROVIDER_SITE_OTHER): Payer: Medicare Other | Admitting: Psychiatry

## 2015-05-24 VITALS — BP 124/76 | HR 76 | Temp 97.2°F | Ht 60.0 in | Wt 201.2 lb

## 2015-05-24 DIAGNOSIS — G4700 Insomnia, unspecified: Secondary | ICD-10-CM

## 2015-05-24 DIAGNOSIS — F331 Major depressive disorder, recurrent, moderate: Secondary | ICD-10-CM

## 2015-05-24 DIAGNOSIS — F411 Generalized anxiety disorder: Secondary | ICD-10-CM

## 2015-05-24 MED ORDER — BUPROPION HCL ER (SR) 100 MG PO TB12: 100.0000 mg | ORAL_TABLET | ORAL | Status: DC

## 2015-05-24 MED ORDER — QUETIAPINE FUMARATE 400 MG PO TABS: 400.0000 mg | ORAL_TABLET | Freq: Every day | ORAL | Status: DC

## 2015-05-24 NOTE — Progress Notes (Signed)
BH MD/PA/NP OP Progress Note  05/24/2015 2:32 PM Cassidy [name]  MRN:  [id number]  Subjective:  Patient returns for follow-up of her major depressive disorder, generalized anxiety disorder, panic disorder and insomnia. The patient presents with her younger sister. At the last visit they indicate that they the patient's neurologist he was concerned with serotonin and cut her Seroquel dose from 400 mg down to 300 mg. however they report worsening of the patient's mood and thus the patient has now  returned to 400 mg.  At the last visit we started some Vistaril to address anxiety. Patient stated that medication did "nothing" for her. However when I asked her what she was expecting from it she stated she was expecting it would help her with depression and I clarified that this medication was to be used to address periodic anxiety. I did explain to patient that she could try a higher dose of the Vistaril and then patient stated she actually took 4 of them on one occasion and did not notice anything and did not experience any sedation.  She they have indicated that other providers were concerned about the amount of serotonin she was getting and we had made steps to decrease her Lexapro from 20 mg to 10 mg. Patient still complains of depression and states she really wants something to help her. I did discuss with them that perhaps Wellbutrin might be a choice because it does not operate on serotonin and she could take it with the Lexapro.  Patient's sister did express that the patient is going to get started back in physical therapy which will be a good thing for her to work towards coming more active.  Chief Complaint:  Chief Complaint    Follow-up; Medication Refill     Visit Diagnosis:     ICD-9-CM ICD-10-CM   1. Major depressive disorder, recurrent episode, moderate (HCC) 296.32 F33.1   2. GAD (generalized anxiety disorder) 300.02 F41.1   3. Insomnia 780.52 G47.00     Past Medical History:  Past  Medical History  Diagnosis Date  . Thyroid disease   . Anxiety   . Depression   . Hypothyroidism   . GERD (gastroesophageal reflux disease)   . Fatty liver   . Obesity     Past Surgical History  Procedure Laterality Date  . Tonsillectomy    . Appendectomy    . Esophagogastroduodenoscopy (egd) with propofol N/A 12/20/2014    Procedure: ESOPHAGOGASTRODUODENOSCOPY (EGD) WITH PROPOFOL;  Surgeon: Manya Silvas, MD;  Location: [place];  Service: Endoscopy;  Laterality: N/A;  . Savory dilation N/A 12/20/2014    Procedure: SAVORY DILATION;  Surgeon: Manya Silvas, MD;  Location: [facility] ENDOSCOPY;  Service: Endoscopy;  Laterality: N/A;   Family History:  Family History  Problem Relation Age of Onset  . Diabetes Mother   . COPD Mother   . Kidney disease Mother   . Depression Mother   . Heart attack Father   . Aneurysm Father   . Anxiety disorder Sister   . Depression Sister   . Diabetes Sister   . Hypertension Sister   . Atrial fibrillation Brother   . Spinal muscular atrophy Brother   . Breast cancer Sister   . Bone cancer Sister   . Depression Sister   . Anxiety disorder Sister   . Breast cancer Sister   . Colon cancer Sister   . Depression Sister   . Diabetes Sister   . COPD Sister   . Depression  Sister   . Anxiety disorder Sister   . Diabetes Brother   . Depression Brother    Social History:  Social History   Social History  . Marital Status: Widowed    Spouse Name: N/A  . Number of Children: N/A  . Years of Education: N/A   Social History Main Topics  . Smoking status: Never Smoker   . Smokeless tobacco: Never Used  . Alcohol Use: No  . Drug Use: No  . Sexual Activity: Not Currently    Birth Control/ Protection: Post-menopausal   Other Topics Concern  . None   Social History Narrative   Additional History:   Assessment:   Musculoskeletal: Strength & Muscle Tone: decreased Gait & Station: In a wheelchair today  Patient leans:  N/A  Psychiatric Specialty Exam: HPI  Review of Systems  Psychiatric/Behavioral: Positive for depression and memory loss. Negative for suicidal ideas, hallucinations and substance abuse. The patient is not nervous/anxious and does not have insomnia.   All other systems reviewed and are negative.   Blood pressure 124/76, pulse 76, temperature 97.2 F (36.2 C), temperature source Tympanic, height 5' (1.524 m), weight 201 lb 3.2 oz (91.264 kg), SpO2 95 %.Body mass index is 39.29 kg/(m^2).  General Appearance: Well Groomed  Eye Contact:  Good  Speech:  Clear and Coherent  Volume:  Normal  Mood:  not so good today  Affect:  Depressed  Thought Process:  Linear  Orientation:  Full (Time, Place, and Person)  Thought Content:  Negative  Suicidal Thoughts:  No  Homicidal Thoughts:  No  Memory:  Immediate;   Good Recent;   Good Remote;   Good  Judgement:  Negative  Insight:  Good  Psychomotor Activity:  Negative  Concentration:  Good  Recall:  Good  Fund of Knowledge: Good  Language: Good  Akathisia:  Negative  Handed:  Right unknown   AIMS (if indicated):   Done today, normal  Assets:  Communication Skills Desire for Improvement Social Support  ADL's:  Intact  Cognition: WNL  Sleep:  good   Is the patient at risk to self?  No. Has the patient been a risk to self in the past 6 months?  No. Has the patient been a risk to self within the distant past?  No. Is the patient a risk to others?  No. Has the patient been a risk to others in the past 6 months?  No. Has the patient been a risk to others within the distant past?  No.  Current Medications: Current Outpatient Prescriptions  Medication Sig Dispense Refill  . busPIRone (BUSPAR) 15 MG tablet Take 1 tablet (15 mg total) by mouth 2 (two) times daily. 60 tablet 4  . celecoxib (CELEBREX) 100 MG capsule TAKE ONE CAPSULE BY MOUTH TWICE A DAY    . donepezil (ARICEPT) 5 MG tablet TAKE 1 TABLET (5 MG TOTAL) BY MOUTH NIGHTLY.  6  .  escitalopram (LEXAPRO) 10 MG tablet Take 1 tablet (10 mg total) by mouth at bedtime. 30 tablet 4  . ferrous fumarate-iron polysaccharide complex (TANDEM) 162-115.2 MG CAPS Take 1 capsule by mouth at bedtime.    . fluticasone (FLONASE) 50 MCG/ACT nasal spray Place 1 spray into both nostrils daily.    Marland Kitchen KLOR-CON 10 10 MEQ tablet     . levothyroxine (SYNTHROID, LEVOTHROID) 75 MCG tablet Take 75 mcg by mouth daily before breakfast.    . omeprazole (PRILOSEC) 20 MG capsule Take 20 mg by mouth 2 (two)  times daily.    . QUEtiapine (SEROQUEL) 400 MG tablet Take 1 tablet (400 mg total) by mouth at bedtime. 30 tablet 3  . traZODone (DESYREL) 150 MG tablet Take 1 tablet (150 mg total) by mouth at bedtime. 30 tablet 4  . buPROPion (WELLBUTRIN SR) 100 MG 12 hr tablet Take 1 tablet (100 mg total) by mouth every morning. 30 tablet 1   No current facility-administered medications for this visit.    Medical Decision Making:  Review of Medication Regimen & Side Effects (2)  Treatment Plan Summary:Medication management and Plan   Major depressive disorder-continue Lexapro 10 mg daily. Patient's neurologist had decreased Seroquel from 400 mg bedtime to 300 mg at bedtime. However patient return to taking 400 mg because she felt her mood was worse 300 mg. We're going to go ahead and add some Wellbutrin SR 100 mg in the morning in hopes to augment for depression. Skin benefits have been discussed and patient is able to consent.  Generalized anxiety disorder-continue BuSpar 15 mg twice daily. She does state she feels this medication has been helpful to her.  Panic disorder-Lexapro as above. Go ahead and discontinue the Vistaril as patient reported it was ineffective  Discussed with them how the patient is managing her medications (i.e. use of a pillbox) 6. Patient's sister indicated that the patient is handling her own medication. I did discuss that if the patient is not taking medications correctly they may not work  effectively. Patient's sister did discuss that a pillbox might be helpful but that the patient did not want to use 1. I did recommend that the patient begin using a pillbox to enhance the ability to be regular and consistent with her medications.  Given that patient's depression has been difficult to respond to medicines and the concerns of multiple serotonergic agents, one might consider an elective admission to a geriatric unit (i.e. [place]) should she continue to fail to respond. It also might be useful to observe the patient's response when others are managing her medicines to see if perhaps noncompliance or confusion about medications could be called skating factor. Patient is on donepezil by her neurologist. Turns silly exploration of some home health/nursing might be helpful to to address medication management and adherence. Patient is adamant that she takes her medications correctly.  She'll follow up in one month. They are aware of my departure from the practice and they will follow-up with a new psychiatrist. I've encouraged him to call with any questions or concerns prior to the next appointment.  [name] 05/24/2015, 2:32 PM

## 2015-06-06 ENCOUNTER — Ambulatory Visit: Payer: Medicare Other | Admitting: Physical Therapy

## 2015-06-12 ENCOUNTER — Encounter: Payer: Medicare Other | Admitting: Physical Therapy

## 2015-06-14 ENCOUNTER — Encounter: Payer: Medicare Other | Admitting: Physical Therapy

## 2015-06-19 ENCOUNTER — Encounter: Payer: Medicare Other | Admitting: Physical Therapy

## 2015-06-21 ENCOUNTER — Encounter: Payer: Medicare Other | Admitting: Physical Therapy

## 2015-06-21 ENCOUNTER — Encounter: Payer: Self-pay | Admitting: Psychiatry

## 2015-06-21 ENCOUNTER — Ambulatory Visit (INDEPENDENT_AMBULATORY_CARE_PROVIDER_SITE_OTHER): Payer: Medicare Other | Admitting: Psychiatry

## 2015-06-21 DIAGNOSIS — F410 Panic disorder [episodic paroxysmal anxiety] without agoraphobia: Secondary | ICD-10-CM | POA: Diagnosis not present

## 2015-06-21 DIAGNOSIS — F5101 Primary insomnia: Secondary | ICD-10-CM

## 2015-06-21 DIAGNOSIS — F411 Generalized anxiety disorder: Secondary | ICD-10-CM | POA: Diagnosis not present

## 2015-06-21 DIAGNOSIS — F339 Major depressive disorder, recurrent, unspecified: Secondary | ICD-10-CM | POA: Diagnosis not present

## 2015-06-21 MED ORDER — BUPROPION HCL ER (SR) 100 MG PO TB12: 100.0000 mg | ORAL_TABLET | Freq: Two times a day (BID) | ORAL | Status: DC

## 2015-06-21 NOTE — Progress Notes (Signed)
Patient ID: Cassidy [name], female   DOB: [birth year], 75 y.o.   MRN: [id number] [facility] MD/PA/NP OP Progress Note  06/21/2015 1:02 PM VARIE MACHAMER  MRN:  [id number]  Subjective:  Patient returns for follow-up of her major depressive disorder, generalized anxiety disorder, panic disorder and insomnia. The patient presents with her younger sister. She was previously seen by Dr. Jimmye Norman. At that time he had started her on Wellbutrin at 100 mg once daily. Patient today reports that the Wellbutrin has not done anything for her and she continues to be depressed. Her sister states that patient has 3 daughters who live in [place] in [place]. She states that the doctors do not care about her mother's health and though they call they're unaware of her health situation. Sister reports that she is the one who has been taking care of the patient in addressing her medical needs. Patient lives with her 52 year old grandson who she sees as quite supportive. She reports that she raised her grandson since he was born. Patient's sister reports that it's as if patient has given up on getting better. Patient denies any suicidal thoughts. States that she would like to not feel so depressed and down all the time.   Chief Complaint: Depression  Visit Diagnosis:   No diagnosis found.  Past Medical History:  Past Medical History  Diagnosis Date  . Thyroid disease   . Anxiety   . Depression   . Hypothyroidism   . GERD (gastroesophageal reflux disease)   . Fatty liver   . Obesity     Past Surgical History  Procedure Laterality Date  . Tonsillectomy    . Appendectomy    . Esophagogastroduodenoscopy (egd) with propofol N/A 12/20/2014    Procedure: ESOPHAGOGASTRODUODENOSCOPY (EGD) WITH PROPOFOL;  Surgeon: Manya Silvas, MD;  Location: [place];  Service: Endoscopy;  Laterality: N/A;  . Savory dilation N/A 12/20/2014    Procedure: SAVORY DILATION;  Surgeon: Manya Silvas, MD;  Location: [facility] ENDOSCOPY;   Service: Endoscopy;  Laterality: N/A;   Family History:  Family History  Problem Relation Age of Onset  . Diabetes Mother   . COPD Mother   . Kidney disease Mother   . Depression Mother   . Heart attack Father   . Aneurysm Father   . Anxiety disorder Sister   . Depression Sister   . Diabetes Sister   . Hypertension Sister   . Atrial fibrillation Brother   . Spinal muscular atrophy Brother   . Breast cancer Sister   . Bone cancer Sister   . Depression Sister   . Anxiety disorder Sister   . Breast cancer Sister   . Colon cancer Sister   . Depression Sister   . Diabetes Sister   . COPD Sister   . Depression Sister   . Anxiety disorder Sister   . Diabetes Brother   . Depression Brother    Social History:  Social History   Social History  . Marital Status: Widowed    Spouse Name: N/A  . Number of Children: N/A  . Years of Education: N/A   Social History Main Topics  . Smoking status: Never Smoker   . Smokeless tobacco: Never Used  . Alcohol Use: No  . Drug Use: No  . Sexual Activity: Not Currently    Birth Control/ Protection: Post-menopausal   Other Topics Concern  . Not on file   Social History Narrative   Additional History:   Assessment:   Musculoskeletal:  Strength & Muscle Tone: decreased Gait & Station: In a wheelchair today  Patient leans: N/A  Psychiatric Specialty Exam: HPI  Review of Systems  Psychiatric/Behavioral: Positive for depression and memory loss. Negative for suicidal ideas, hallucinations and substance abuse. The patient is not nervous/anxious and does not have insomnia.   All other systems reviewed and are negative.   There were no vitals taken for this visit.There is no weight on file to calculate BMI.  General Appearance: Well Groomed  Eye Contact:  Good  Speech:  Clear and Coherent  Volume:  Normal  Mood:  not so good today  Affect:  Depressed  Thought Process:  Linear  Orientation:  Full (Time, Place, and Person)   Thought Content:  Negative  Suicidal Thoughts:  No  Homicidal Thoughts:  No  Memory:  Immediate;   Good Recent;   Good Remote;   Good  Judgement:  Negative  Insight:  Good  Psychomotor Activity:  Negative  Concentration:  Good  Recall:  Good  Fund of Knowledge: Good  Language: Good  Akathisia:  Negative  Handed:  Right unknown   AIMS (if indicated):   Done today, normal  Assets:  Communication Skills Desire for Improvement Social Support  ADL's:  Intact  Cognition: WNL  Sleep:  good   Is the patient at risk to self?  No. Has the patient been a risk to self in the past 6 months?  No. Has the patient been a risk to self within the distant past?  No. Is the patient a risk to others?  No. Has the patient been a risk to others in the past 6 months?  No. Has the patient been a risk to others within the distant past?  No.  Current Medications: Current Outpatient Prescriptions  Medication Sig Dispense Refill  . buPROPion (WELLBUTRIN SR) 100 MG 12 hr tablet Take 1 tablet (100 mg total) by mouth every morning. 30 tablet 1  . busPIRone (BUSPAR) 15 MG tablet Take 1 tablet (15 mg total) by mouth 2 (two) times daily. 60 tablet 4  . celecoxib (CELEBREX) 100 MG capsule TAKE ONE CAPSULE BY MOUTH TWICE A DAY    . donepezil (ARICEPT) 5 MG tablet TAKE 1 TABLET (5 MG TOTAL) BY MOUTH NIGHTLY.  6  . escitalopram (LEXAPRO) 10 MG tablet Take 1 tablet (10 mg total) by mouth at bedtime. 30 tablet 4  . ferrous fumarate-iron polysaccharide complex (TANDEM) 162-115.2 MG CAPS Take 1 capsule by mouth at bedtime.    . fluticasone (FLONASE) 50 MCG/ACT nasal spray Place 1 spray into both nostrils daily.    Marland Kitchen KLOR-CON 10 10 MEQ tablet     . levothyroxine (SYNTHROID, LEVOTHROID) 75 MCG tablet Take 75 mcg by mouth daily before breakfast.    . omeprazole (PRILOSEC) 20 MG capsule Take 20 mg by mouth 2 (two) times daily.    . QUEtiapine (SEROQUEL) 400 MG tablet Take 1 tablet (400 mg total) by mouth at bedtime.  30 tablet 3  . traZODone (DESYREL) 150 MG tablet Take 1 tablet (150 mg total) by mouth at bedtime. 30 tablet 4   No current facility-administered medications for this visit.    Medical Decision Making:  Review of Medication Regimen & Side Effects (2)  Treatment Plan Summary:Medication management and Plan   Major depressive disorder- Increase Wellbutrin 200 mg twice daily.  Decrease Lexapro to 5 mg once daily  Continue Seroquel at 400 mg once daily. Discussed decreasing the Seroquel but both patient and  her sister state that every time it has been decreased patient has not done well   Generalized anxiety disorder-continue BuSpar 15 mg twice daily. She does state she feels this medication has been helpful to her.  Panic disorder-Lexapro as above.  Discussed with them about patient being on multiple medications and still not doing well. Sister acknowledges that the multiple medications is an issue. She states that they're not sure what has been working and what has not. They are okay with slowly tapering off certain medications and working with this clinician to achieve a good maintenance dose and decrease patient's mood symptoms. RTC clinic in a month's time or call before if needed     Netta Fodge 06/21/2015, 1:02 PM

## 2015-06-25 ENCOUNTER — Other Ambulatory Visit: Payer: Self-pay

## 2015-06-25 MED ORDER — BUSPIRONE HCL 15 MG PO TABS: 15.0000 mg | ORAL_TABLET | Freq: Two times a day (BID) | ORAL | Status: DC

## 2015-06-25 MED ORDER — TRAZODONE HCL 150 MG PO TABS: 150.0000 mg | ORAL_TABLET | Freq: Every day | ORAL | Status: DC

## 2015-06-25 MED ORDER — ESCITALOPRAM OXALATE 10 MG PO TABS: 10.0000 mg | ORAL_TABLET | Freq: Every day | ORAL | Status: DC

## 2015-06-25 NOTE — Telephone Encounter (Signed)
pharmacy requested a 90 day supply of her medication buspirone hcl 59m , escitalopram 123mtrazodone 15061mt was last seen on  06-21-15 next appt 07-24-15

## 2015-06-25 NOTE — Telephone Encounter (Signed)
rx for trazodone Id# [id number] order # [id number] ,  lexapro id # [id number] order # [id number] and buspirone 32m id # [id number] # [id number] faxed and confirmed.

## 2015-06-26 ENCOUNTER — Encounter: Payer: Medicare Other | Admitting: Physical Therapy

## 2015-06-28 ENCOUNTER — Ambulatory Visit: Payer: Medicare Other | Admitting: Physical Therapy

## 2015-07-03 ENCOUNTER — Encounter: Payer: Medicare Other | Admitting: Physical Therapy

## 2015-07-05 ENCOUNTER — Encounter: Payer: Medicare Other | Admitting: Physical Therapy

## 2015-07-10 ENCOUNTER — Encounter: Payer: Medicare Other | Admitting: Physical Therapy

## 2015-07-12 ENCOUNTER — Encounter: Payer: Medicare Other | Admitting: Physical Therapy

## 2015-07-17 ENCOUNTER — Encounter: Payer: Medicare Other | Admitting: Physical Therapy

## 2015-07-19 ENCOUNTER — Encounter: Payer: Medicare Other | Admitting: Physical Therapy

## 2015-07-23 DIAGNOSIS — D696 Thrombocytopenia, unspecified: Secondary | ICD-10-CM | POA: Insufficient documentation

## 2015-07-24 ENCOUNTER — Ambulatory Visit (INDEPENDENT_AMBULATORY_CARE_PROVIDER_SITE_OTHER): Payer: Medicare Other | Admitting: Psychiatry

## 2015-07-24 ENCOUNTER — Encounter: Payer: Self-pay | Admitting: Psychiatry

## 2015-07-24 VITALS — BP 110/78 | HR 77 | Temp 98.6°F

## 2015-07-24 DIAGNOSIS — G4700 Insomnia, unspecified: Secondary | ICD-10-CM

## 2015-07-24 DIAGNOSIS — F411 Generalized anxiety disorder: Secondary | ICD-10-CM

## 2015-07-24 DIAGNOSIS — F331 Major depressive disorder, recurrent, moderate: Secondary | ICD-10-CM | POA: Diagnosis not present

## 2015-07-24 MED ORDER — TRAZODONE HCL 100 MG PO TABS: 100.0000 mg | ORAL_TABLET | Freq: Every day | ORAL | Status: DC

## 2015-07-24 MED ORDER — BUPROPION HCL ER (SR) 100 MG PO TB12: 100.0000 mg | ORAL_TABLET | Freq: Every day | ORAL | Status: DC

## 2015-07-24 NOTE — Progress Notes (Signed)
Patient ID: Cassidy [name], female   DOB: 30-Dec-1940, 75 y.o.   MRN: [id number] [facility] MD/PA/NP OP Progress Note  07/24/2015 1:42 PM Cassidy [name]  MRN:  [id number]  Subjective:  Patient returns for follow-up of her major depressive disorder, generalized anxiety disorder, panic disorder and insomnia. The patient presents with her younger sister. Patient's sister reports that it's as if patient has given up on getting better. Patient's sister states that over the past month patient has not been eating well. Her graduate her appetite has declined gradually and she lost 10 pounds in the last 4 weeks. She also reports that patient is stating that she finds it difficult to dress herself and also to put on her seatbelt in the car. Patient does acknowledge that she feels very depressed and if not for her grandson she didn't care if she lived. She denies any active suicidal thoughts. Her Wellbutrin was increased from 100 mg to 200 mg at her last visit. She feels this has not helped at all. We had decreased her Lexapro from 10-5 mg in an effort to simplify her medication regimen. Patient's sister also reports that patient has had several falls and this was attributed to her being weak. Patient also takes 400 mg of Seroquel at bedtime that she has been taking for several years and per sister any efforts to decrease this medication has caused problems in the past. Patient also takes trazodone 150 mg at bedtime to help her sleep. Discussed with them that a combination of all these medications could also be [place] into her falls.   Chief Complaint: Depression Chief Complaint    Follow-up; Medication Refill     Visit Diagnosis:   No diagnosis found.  Past Medical History:  Past Medical History  Diagnosis Date  . Thyroid disease   . Anxiety   . Depression   . Hypothyroidism   . GERD (gastroesophageal reflux disease)   . Fatty liver   . Obesity     Past Surgical History  Procedure Laterality Date  .  Tonsillectomy    . Appendectomy    . Esophagogastroduodenoscopy (egd) with propofol N/A 12/20/2014    Procedure: ESOPHAGOGASTRODUODENOSCOPY (EGD) WITH PROPOFOL;  Surgeon: Manya Silvas, MD;  Location: [place];  Service: Endoscopy;  Laterality: N/A;  . Savory dilation N/A 12/20/2014    Procedure: SAVORY DILATION;  Surgeon: Manya Silvas, MD;  Location: [facility] ENDOSCOPY;  Service: Endoscopy;  Laterality: N/A;   Family History:  Family History  Problem Relation Age of Onset  . Diabetes Mother   . COPD Mother   . Kidney disease Mother   . Depression Mother   . Heart attack Father   . Aneurysm Father   . Anxiety disorder Sister   . Depression Sister   . Diabetes Sister   . Hypertension Sister   . Atrial fibrillation Brother   . Spinal muscular atrophy Brother   . Breast cancer Sister   . Bone cancer Sister   . Depression Sister   . Anxiety disorder Sister   . Breast cancer Sister   . Colon cancer Sister   . Depression Sister   . Diabetes Sister   . COPD Sister   . Depression Sister   . Anxiety disorder Sister   . Diabetes Brother   . Depression Brother    Social History:  Social History   Social History  . Marital Status: Widowed    Spouse Name: N/A  . Number of Children: N/A  .  Years of Education: N/A   Social History Main Topics  . Smoking status: Never Smoker   . Smokeless tobacco: Never Used  . Alcohol Use: No  . Drug Use: No  . Sexual Activity: Not Currently    Birth Control/ Protection: Post-menopausal   Other Topics Concern  . None   Social History Narrative   Additional History:   Assessment:   Musculoskeletal: Strength & Muscle Tone: decreased Gait & Station: In a wheelchair today  Patient leans: N/A  Psychiatric Specialty Exam: HPI  Review of Systems  Psychiatric/Behavioral: Positive for depression and memory loss. Negative for suicidal ideas, hallucinations and substance abuse. The patient is not nervous/anxious and does not have  insomnia.   All other systems reviewed and are negative.   Blood pressure 110/78, pulse 77, temperature 98.6 F (37 C), temperature source Tympanic, SpO2 94 %.There is no weight on file to calculate BMI.  General Appearance: Well Groomed  Eye Contact:  Good  Speech:  Clear and Coherent  Volume:  Normal  Mood:  not so good today  Affect:  Depressed  Thought Process:  Linear  Orientation:  Full (Time, Place, and Person)  Thought Content:  Negative  Suicidal Thoughts:  No  Homicidal Thoughts:  No  Memory:  Immediate;   Good Recent;   Good Remote;   Good  Judgement:  Negative  Insight:  Good  Psychomotor Activity:  Negative  Concentration:  Good  Recall:  Good  Fund of Knowledge: Good  Language: Good  Akathisia:  Negative  Handed:  Right unknown   AIMS (if indicated):   Done today, normal  Assets:  Communication Skills Desire for Improvement Social Support  ADL's:  Intact  Cognition: WNL  Sleep:  good   Is the patient at risk to self?  No. Has the patient been a risk to self in the past 6 months?  No. Has the patient been a risk to self within the distant past?  No. Is the patient a risk to others?  No. Has the patient been a risk to others in the past 6 months?  No. Has the patient been a risk to others within the distant past?  No.  Current Medications: Current Outpatient Prescriptions  Medication Sig Dispense Refill  . buPROPion (WELLBUTRIN SR) 100 MG 12 hr tablet Take 1 tablet (100 mg total) by mouth 2 (two) times daily. 60 tablet 1  . busPIRone (BUSPAR) 15 MG tablet Take 1 tablet (15 mg total) by mouth 2 (two) times daily. 180 tablet 0  . celecoxib (CELEBREX) 100 MG capsule TAKE ONE CAPSULE BY MOUTH TWICE A DAY    . escitalopram (LEXAPRO) 10 MG tablet Take 1 tablet (10 mg total) by mouth at bedtime. 90 tablet 0  . fluticasone (FLONASE) 50 MCG/ACT nasal spray Place 1 spray into both nostrils daily.    Marland Kitchen KLOR-CON 10 10 MEQ tablet     . levothyroxine (SYNTHROID,  LEVOTHROID) 75 MCG tablet Take 75 mcg by mouth daily before breakfast.    . omeprazole (PRILOSEC) 20 MG capsule Take 20 mg by mouth 2 (two) times daily.    . QUEtiapine (SEROQUEL) 400 MG tablet Take 1 tablet (400 mg total) by mouth at bedtime. 30 tablet 3  . traZODone (DESYREL) 150 MG tablet Take 1 tablet (150 mg total) by mouth at bedtime. 90 tablet 0   No current facility-administered medications for this visit.    Medical Decision Making:  Review of Medication Regimen & Side Effects (2)  Treatment Plan Summary:Medication management and Plan   Major depressive disorder- Decrease Wellbutrin to 100 mg once daily.  Discontinue Lexapro. After 2 weeks, decrease  Seroquel to 277m once daily.   Generalized anxiety disorder-continue BuSpar 15 mg twice daily. She does state she feels this medication has been helpful to her.  Panic disorder-As above  Insomnia- decrease trazodone to 100 mg at bedtime  Discussed with both patient and her sister the rationale behind the changes in her medication regimen. They understand that we will need to simplify her medication regimen since she is not doing well on her current set of medications. Discussed that I will refer her to ECT therapy if she does not do well in terms of her mood.  RTC clinic in a month's time or call before if needed.    Ila Landowski 07/24/2015, 1:42 PM

## 2015-08-13 ENCOUNTER — Other Ambulatory Visit (HOSPITAL_COMMUNITY): Payer: Self-pay | Admitting: Psychiatry

## 2015-08-13 ENCOUNTER — Telehealth: Payer: Self-pay

## 2015-08-13 DIAGNOSIS — K7290 Hepatic failure, unspecified without coma: Secondary | ICD-10-CM | POA: Insufficient documentation

## 2015-08-13 DIAGNOSIS — K7682 Hepatic encephalopathy: Secondary | ICD-10-CM | POA: Insufficient documentation

## 2015-08-13 MED ORDER — HYDROXYZINE PAMOATE 25 MG PO CAPS: 25.0000 mg | ORAL_CAPSULE | Freq: Three times a day (TID) | ORAL | Status: DC | PRN

## 2015-08-13 NOTE — Telephone Encounter (Signed)
caller sister called left a message that pt has not been eating . dr. has been making changes to her medication and pt has been more depressed and has been falling more, and memory loss.  pt sister states that pt daughter has died and they dont know how to tell her that her daughter has passed away.

## 2015-08-13 NOTE — Telephone Encounter (Signed)
This M.D. talked to patient's sister who reported that patient's daughter had passed away suddenly in [place]. She stated that they're leaving today to [place] patient's daughter. States that this has really made patient even more depressed. Patient's sister wanted medication to help with patient's anxiety. Given patient's history of falls discussed with her that medication such as Xanax which was being asked for her is not ideal. Discussed with her that she can take Vistaril 25 mg 2-3 times daily as needed but keeping a watch on her ability to walk and given the history of falls. Sister is agreeable to this plan and patient will follow up with this M.D. soon. Sister is also aware that if patient decompensates further she will have to be hospitalized.

## 2015-08-14 ENCOUNTER — Telehealth: Payer: Self-pay

## 2015-08-14 NOTE — Telephone Encounter (Signed)
received fax that rx for hydroxyzine pamoate capsule was approved through 05-15-15 to 08-12-16. [employer] therapist

## 2015-08-23 ENCOUNTER — Encounter: Payer: Self-pay | Admitting: Psychiatry

## 2015-08-23 ENCOUNTER — Ambulatory Visit (INDEPENDENT_AMBULATORY_CARE_PROVIDER_SITE_OTHER): Payer: Medicare Other | Admitting: Psychiatry

## 2015-08-23 VITALS — BP 110/80 | HR 82 | Temp 98.0°F | Ht 60.0 in

## 2015-08-23 DIAGNOSIS — F411 Generalized anxiety disorder: Secondary | ICD-10-CM

## 2015-08-23 DIAGNOSIS — F331 Major depressive disorder, recurrent, moderate: Secondary | ICD-10-CM | POA: Diagnosis not present

## 2015-08-23 MED ORDER — ESCITALOPRAM OXALATE 5 MG PO TABS: 5.0000 mg | ORAL_TABLET | Freq: Every day | ORAL | Status: DC

## 2015-08-23 MED ORDER — QUETIAPINE FUMARATE 200 MG PO TABS: 400.0000 mg | ORAL_TABLET | Freq: Every day | ORAL | Status: DC
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
## 2015-09-08 ENCOUNTER — Other Ambulatory Visit: Payer: Self-pay | Admitting: Psychiatry

## 2015-09-20 ENCOUNTER — Ambulatory Visit (INDEPENDENT_AMBULATORY_CARE_PROVIDER_SITE_OTHER): Payer: Medicare Other | Admitting: Psychiatry

## 2015-09-20 ENCOUNTER — Encounter: Payer: Self-pay | Admitting: Psychiatry

## 2015-09-20 VITALS — BP 128/68 | HR 88 | Temp 97.4°F | Ht 60.0 in | Wt 190.2 lb

## 2015-09-20 DIAGNOSIS — F411 Generalized anxiety disorder: Secondary | ICD-10-CM | POA: Diagnosis not present

## 2015-09-20 DIAGNOSIS — F331 Major depressive disorder, recurrent, moderate: Secondary | ICD-10-CM | POA: Diagnosis not present

## 2015-09-20 MED ORDER — QUETIAPINE FUMARATE 200 MG PO TABS: 400.0000 mg | ORAL_TABLET | Freq: Every day | ORAL | Status: DC

## 2015-09-20 MED ORDER — TRAZODONE HCL 100 MG PO TABS: 100.0000 mg | ORAL_TABLET | Freq: Every day | ORAL | Status: DC

## 2015-09-20 MED ORDER — BUSPIRONE HCL 15 MG PO TABS: 15.0000 mg | ORAL_TABLET | Freq: Two times a day (BID) | ORAL | Status: DC

## 2015-09-20 MED ORDER — BUPROPION HCL ER (SR) 100 MG PO TB12: 100.0000 mg | ORAL_TABLET | Freq: Every day | ORAL | Status: DC

## 2015-09-20 MED ORDER — ESCITALOPRAM OXALATE 5 MG PO TABS: 5.0000 mg | ORAL_TABLET | Freq: Every day | ORAL | Status: DC

## 2015-09-20 NOTE — Progress Notes (Signed)
Patient ID: Cassidy [name], female   DOB: January 22, 1941, 75 y.o.   MRN: [id number]   [facility] MD/PA/NP OP Progress Note  09/20/2015 2:06 PM Cassidy [name]  MRN:  [id number]  Subjective:  Patient returns for follow-up of her major depressive disorder, generalized anxiety disorder, panic disorder and insomnia. She presents with her sister for an appointment. Patient reports being very depressed, not having an appetite. She is mourning her daughter`s death. Per sister, patient seems to have given up. She has not been walking much, depending on her wheel chair mostly. She is staying in bed too much. Denies suicidal thoughts, states she just wishes she is dead. Her sister states that patient is very resistant to getting up and walking around in the home. She also reports that patient keeps her blinds closed and the yells at her family members when they try to open.   Chief Complaint: Depression Chief Complaint    Follow-up; Medication Refill     Visit Diagnosis:     ICD-9-CM ICD-10-CM   1. Major depressive disorder, recurrent episode, moderate (HCC) 296.32 F33.1   2. GAD (generalized anxiety disorder) 300.02 F41.1     Past Medical History:  Past Medical History  Diagnosis Date  . Thyroid disease   . Anxiety   . Depression   . Hypothyroidism   . GERD (gastroesophageal reflux disease)   . Fatty liver   . Obesity     Past Surgical History  Procedure Laterality Date  . Tonsillectomy    . Appendectomy    . Esophagogastroduodenoscopy (egd) with propofol N/A 12/20/2014    Procedure: ESOPHAGOGASTRODUODENOSCOPY (EGD) WITH PROPOFOL;  Surgeon: Manya Silvas, MD;  Location: [place];  Service: Endoscopy;  Laterality: N/A;  . Savory dilation N/A 12/20/2014    Procedure: SAVORY DILATION;  Surgeon: Manya Silvas, MD;  [facility] END[facility]COPY;  Service: Endoscopy;  Laterality: N/A;   Family History:  Family History  Problem Relation Age of Onset  . Diabetes Mother   . COPD Mother   . Kidney  disease Mother   . Depression Mother   . Heart attack Father   . Aneurysm Father   . Anxiety disorder Sister   . Depression Sister   . Diabetes Sister   . Hypertension Sister   . Atrial fibrillation Brother   . Spinal muscular atrophy Brother   . Breast cancer Sister   . Bone cancer Sister   . Depression Sister   . Anxiety disorder Sister   . Breast cancer Sister   . Colon cancer Sister   . Depression Sister   . Diabetes Sister   . COPD Sister   . Depression Sister   . Anxiety disorder Sister   . Diabetes Brother   . Depression Brother    Social History:  Social History   Social History  . Marital Status: Widowed    Spouse Name: N/A  . Number of Children: N/A  . Years of Education: N/A   Social History Main Topics  . Smoking status: Never Smoker   . Smokeless tobacco: Never Used  . Alcohol Use: No  . Drug Use: No  . Sexual Activity: Not Currently    Birth Control/ Protection: Post-menopausal   Other Topics Concern  . None   Social History Narrative   Additional History:   Assessment:   Musculoskeletal: Strength & Muscle Tone: decreased Gait & Station: In a wheelchair today  Patient leans: N/A  Psychiatric Specialty Exam: HPI  Review of Systems  Psychiatric/Behavioral: Positive for depression and memory loss. Negative for suicidal ideas, hallucinations and substance abuse. The patient is not nervous/anxious and does not have insomnia.   All other systems reviewed and are negative.   Blood pressure 128/68, pulse 88, temperature 97.4 F (36.3 C), temperature source Tympanic, height 5' (1.524 m), weight 190 lb 3.2 oz (86.274 kg), SpO2 93 %.Body mass index is 37.15 kg/(m^2).  General Appearance: Well Groomed  Eye Contact:  Good  Speech:  Clear and Coherent  Volume:  Normal  Mood:  Dealing with daughter`s death  Affect:  Depressed  Thought Process:  Linear  Orientation:  Full (Time, Place, and Person)  Thought Content:  Negative  Suicidal Thoughts:   No  Homicidal Thoughts:  No  Memory:  Immediate;   Good Recent;   Good Remote;   Good  Judgement:  Negative  Insight:  Good  Psychomotor Activity:  Negative  Concentration:  Good  Recall:  Good  Fund of Knowledge: Good  Language: Good  Akathisia:  Negative  Handed:  Right unknown   AIMS (if indicated):   Done today, normal  Assets:  Communication Skills Desire for Improvement Social Support  ADL's:  Intact  Cognition: WNL  Sleep:  good   Is the patient at risk to self?  No. Has the patient been a risk to self in the past 6 months?  No. Has the patient been a risk to self within the distant past?  No. Is the patient a risk to others?  No. Has the patient been a risk to others in the past 6 months?  No. Has the patient been a risk to others within the distant past?  No.  Current Medications: Current Outpatient Prescriptions  Medication Sig Dispense Refill  . buPROPion (WELLBUTRIN SR) 100 MG 12 hr tablet Take 1 tablet (100 mg total) by mouth daily. 60 tablet 1  . busPIRone (BUSPAR) 15 MG tablet Take 1 tablet (15 mg total) by mouth 2 (two) times daily. 180 tablet 0  . celecoxib (CELEBREX) 100 MG capsule TAKE ONE CAPSULE BY MOUTH TWICE A DAY    . donepezil (ARICEPT) 5 MG tablet     . escitalopram (LEXAPRO) 5 MG tablet Take 1 tablet (5 mg total) by mouth at bedtime. 30 tablet 0  . fluticasone (FLONASE) 50 MCG/ACT nasal spray Place 1 spray into both nostrils daily.    . hydrOXYzine (VISTARIL) 25 MG capsule Take 1 capsule (25 mg total) by mouth 3 (three) times daily as needed. 30 capsule 0  . KLOR-CON 10 10 MEQ tablet     . Lactulose 20 GM/30ML SOLN Take by mouth.    . levothyroxine (SYNTHROID, LEVOTHROID) 75 MCG tablet Take 75 mcg by mouth daily before breakfast.    . omeprazole (PRILOSEC) 20 MG capsule Take 20 mg by mouth 2 (two) times daily.    . QUEtiapine (SEROQUEL) 200 MG tablet Take 2 tablets (400 mg total) by mouth at bedtime. 30 tablet 1  . traZODone (DESYREL) 100 MG  tablet Take 1 tablet (100 mg total) by mouth at bedtime. 30 tablet 1   No current facility-administered medications for this visit.    Medical Decision Making:  Review of Medication Regimen & Side Effects (2)  Treatment Plan Summary:Medication management and Plan   Major depressive disorder- Continue Wellbutrin at 100 mg once daily.  Continue  Seroquel to 231m once daily  Take the Lexapro at 583mpoq d. Discussed strategies to improve her mood. Encourage patient to sit  near the window where there is a lot of natural light. Discussed the bereavement with her and to mourn her daughter's death but also pay attention to her own needs.  Generalized anxiety disorder-continue BuSpar 15 mg twice daily. She does state she feels this medication has been helpful to her.  Panic disorder-As above  Insomnia- continue trazodone to 100 mg at bedtime  No medication changes made at this time   RTC clinic in 2 months time or call before if needed.    Pharrell Ledford 09/20/2015, 2:06 PM

## 2015-10-24 ENCOUNTER — Ambulatory Visit (INDEPENDENT_AMBULATORY_CARE_PROVIDER_SITE_OTHER): Payer: Medicare Other | Admitting: Psychiatry

## 2015-10-24 DIAGNOSIS — F411 Generalized anxiety disorder: Secondary | ICD-10-CM

## 2015-10-24 DIAGNOSIS — F331 Major depressive disorder, recurrent, moderate: Secondary | ICD-10-CM

## 2015-10-24 MED ORDER — ESCITALOPRAM OXALATE 5 MG PO TABS: 5.0000 mg | ORAL_TABLET | Freq: Every day | ORAL | Status: DC

## 2015-10-24 MED ORDER — QUETIAPINE FUMARATE 100 MG PO TABS: 100.0000 mg | ORAL_TABLET | Freq: Every day | ORAL | Status: DC

## 2015-10-24 MED ORDER — TRAZODONE HCL 100 MG PO TABS: 100.0000 mg | ORAL_TABLET | Freq: Every day | ORAL | Status: DC

## 2015-10-24 NOTE — Progress Notes (Signed)
Patient ID: Cassidy [name], female   DOB: 08/13/1940, 75 y.o.   MRN: [id number]   [facility] MD/PA/NP OP Progress Note  10/24/2015 2:58 PM Cassidy [name]  MRN:  [id number]  Subjective:  Patient returns for follow-up of her major depressive disorder, generalized anxiety disorder, panic disorder and insomnia.  Patient was seen with her sister today for follow-up. Patient looks better than previously. She looks brighter. Her sister continues to report that patient is mostly in the wheelchair and does not walk around much in the house. Patient continues to report that she does not have much of an appetite but makes an effort to eat. Reports that she is doing okay with that dealing with her daughter's death. Her sister also reports that patient has been coping well. She denies any suicidal thoughts. States that she enjoys having her grandson and the house who is 43. Patient's sister reports that she is sleeping well but continues to have some trouble after she takes the Seroquel of 200 and 100 of trazodone at night.  Chief Complaint: Depression  Visit Diagnosis:     ICD-9-CM ICD-10-CM   1. Major depressive disorder, recurrent episode, moderate (HCC) 296.32 F33.1   2. GAD (generalized anxiety disorder) 300.02 F41.1     Past Medical History:  Past Medical History  Diagnosis Date  . Thyroid disease   . Anxiety   . Depression   . Hypothyroidism   . GERD (gastroesophageal reflux disease)   . Fatty liver   . Obesity     Past Surgical History  Procedure Laterality Date  . Tonsillectomy    . Appendectomy    . Esophagogastroduodenoscopy (egd) with propofol N/A 12/20/2014    Procedure: ESOPHAGOGASTRODUODENOSCOPY (EGD) WITH PROPOFOL;  Surgeon: Manya Silvas, MD;  Location: [place];  Service: Endoscopy;  Laterality: N/A;  . Savory dilation N/A 12/20/2014    Procedure: SAVORY DILATION;  Surgeon: Manya Silvas, MD;  Location: [facility] ENDOSCOPY;  Service: Endoscopy;  Laterality: N/A;   Family History:   Family History  Problem Relation Age of Onset  . Diabetes Mother   . COPD Mother   . Kidney disease Mother   . Depression Mother   . Heart attack Father   . Aneurysm Father   . Anxiety disorder Sister   . Depression Sister   . Diabetes Sister   . Hypertension Sister   . Atrial fibrillation Brother   . Spinal muscular atrophy Brother   . Breast cancer Sister   . Bone cancer Sister   . Depression Sister   . Anxiety disorder Sister   . Breast cancer Sister   . Colon cancer Sister   . Depression Sister   . Diabetes Sister   . COPD Sister   . Depression Sister   . Anxiety disorder Sister   . Diabetes Brother   . Depression Brother    Social History:  Social History   Social History  . Marital Status: Widowed    Spouse Name: N/A  . Number of Children: N/A  . Years of Education: N/A   Social History Main Topics  . Smoking status: Never Smoker   . Smokeless tobacco: Never Used  . Alcohol Use: No  . Drug Use: No  . Sexual Activity: Not Currently    Birth Control/ Protection: Post-menopausal   Other Topics Concern  . Not on file   Social History Narrative   Additional History:   Assessment:   Musculoskeletal: Strength & Muscle Tone: decreased Gait & Station:  In a wheelchair today  Patient leans: N/A  Psychiatric Specialty Exam: HPI  Review of Systems  Psychiatric/Behavioral: Positive for depression and memory loss. Negative for suicidal ideas, hallucinations and substance abuse. The patient is not nervous/anxious and does not have insomnia.   All other systems reviewed and are negative.   There were no vitals taken for this visit.There is no weight on file to calculate BMI.  General Appearance: Well Groomed  Eye Contact:  Good  Speech:  Clear and Coherent  Volume:  Normal  Mood:  Reports depressed   Affect:  Pleasant   Thought Process:  Linear  Orientation:  Full (Time, Place, and Person)  Thought Content:  Negative  Suicidal Thoughts:  No   Homicidal Thoughts:  No  Memory:  Immediate;   Good Recent;   Good Remote;   Good  Judgement:  Negative  Insight:  Good  Psychomotor Activity:  Negative  Concentration:  Good  Recall:  Good  Fund of Knowledge: Good  Language: Good  Akathisia:  Negative  Handed:  Right unknown   AIMS (if indicated):   Done today, normal  Assets:  Communication Skills Desire for Improvement Social Support  ADL's:  Intact  Cognition: WNL  Sleep:  good   Is the patient at risk to self?  No. Has the patient been a risk to self in the past 6 months?  No. Has the patient been a risk to self within the distant past?  No. Is the patient a risk to others?  No. Has the patient been a risk to others in the past 6 months?  No. Has the patient been a risk to others within the distant past?  No.  Current Medications: Current Outpatient Prescriptions  Medication Sig Dispense Refill  . buPROPion (WELLBUTRIN SR) 100 MG 12 hr tablet Take 1 tablet (100 mg total) by mouth daily. 60 tablet 1  . busPIRone (BUSPAR) 15 MG tablet Take 1 tablet (15 mg total) by mouth 2 (two) times daily. 180 tablet 0  . celecoxib (CELEBREX) 100 MG capsule TAKE ONE CAPSULE BY MOUTH TWICE A DAY    . donepezil (ARICEPT) 5 MG tablet     . escitalopram (LEXAPRO) 5 MG tablet Take 1 tablet (5 mg total) by mouth at bedtime. 30 tablet 0  . fluticasone (FLONASE) 50 MCG/ACT nasal spray Place 1 spray into both nostrils daily.    . hydrOXYzine (VISTARIL) 25 MG capsule Take 1 capsule (25 mg total) by mouth 3 (three) times daily as needed. 30 capsule 0  . KLOR-CON 10 10 MEQ tablet     . Lactulose 20 GM/30ML SOLN Take by mouth.    . levothyroxine (SYNTHROID, LEVOTHROID) 75 MCG tablet Take 75 mcg by mouth daily before breakfast.    . omeprazole (PRILOSEC) 20 MG capsule Take 20 mg by mouth 2 (two) times daily.    . QUEtiapine (SEROQUEL) 200 MG tablet Take 2 tablets (400 mg total) by mouth at bedtime. 30 tablet 1  . traZODone (DESYREL) 100 MG tablet  Take 1 tablet (100 mg total) by mouth at bedtime. 30 tablet 1   No current facility-administered medications for this visit.    Medical Decision Making:  Review of Medication Regimen & Side Effects (2)  Treatment Plan Summary:Medication management and Plan   Major depressive disorder- Continue Wellbutrin at 100 mg once daily.  Decrease Seroquel to 146m once daily  Take the Lexapro at 575mpoq d. Discussed strategies to improve her mood. Encourage patient to  sit near the window where there is a lot of natural light.   Generalized anxiety disorder-continue BuSpar 15 mg twice daily. She does state she feels this medication has been helpful to her.  Panic disorder-As above  Insomnia- continue trazodone to 100 mg at bedtime   RTC clinic in 2 months time or call before if needed.    Martena Emanuele 10/24/2015, 2:58 PM

## 2015-12-25 ENCOUNTER — Encounter: Payer: Self-pay | Admitting: Psychiatry

## 2015-12-25 ENCOUNTER — Ambulatory Visit (INDEPENDENT_AMBULATORY_CARE_PROVIDER_SITE_OTHER): Payer: Medicare Other | Admitting: Psychiatry

## 2015-12-25 VITALS — BP 116/76 | HR 85 | Temp 98.5°F

## 2015-12-25 DIAGNOSIS — F331 Major depressive disorder, recurrent, moderate: Secondary | ICD-10-CM

## 2015-12-25 DIAGNOSIS — F411 Generalized anxiety disorder: Secondary | ICD-10-CM | POA: Diagnosis not present

## 2015-12-25 DIAGNOSIS — G4700 Insomnia, unspecified: Secondary | ICD-10-CM

## 2015-12-25 MED ORDER — BUSPIRONE HCL 15 MG PO TABS: 15.0000 mg | ORAL_TABLET | Freq: Two times a day (BID) | ORAL | 0 refills | Status: DC

## 2015-12-25 MED ORDER — ESCITALOPRAM OXALATE 5 MG PO TABS: 5.0000 mg | ORAL_TABLET | Freq: Every day | ORAL | 1 refills | Status: DC

## 2015-12-25 MED ORDER — QUETIAPINE FUMARATE 100 MG PO TABS: 100.0000 mg | ORAL_TABLET | Freq: Every day | ORAL | 1 refills | Status: DC

## 2015-12-25 MED ORDER — TRAZODONE HCL 100 MG PO TABS: 100.0000 mg | ORAL_TABLET | Freq: Every day | ORAL | 1 refills | Status: DC

## 2015-12-25 NOTE — Progress Notes (Signed)
Patient ID: Cassidy [name], female   DOB: [birth year], 75 y.o.   MRN: [id number]   [facility] MD/PA/NP OP Progress Note  12/25/2015 1:51 PM Cassidy [name]  MRN:  [id number]  Subjective:  Patient returns for follow-up of her major depressive disorder, generalized anxiety disorder, panic disorder and insomnia. Patient was seen with another sister today. Patient has been able to tolerate the decrease in the Seroquel 200 mg. Her affect does look bright but patient does endorse being depressed . She and reports that she has no appetite. Reports sleeping well.  She has been thinking about assisted living.  She denies any suicidal thoughts. States that she enjoys having her grandson and the house who is 81.   Chief Complaint: Depression Chief Complaint    Follow-up; Medication Refill     Visit Diagnosis:     ICD-9-CM ICD-10-CM   1. Major depressive disorder, recurrent episode, moderate (HCC) 296.32 F33.1   2. GAD (generalized anxiety disorder) 300.02 F41.1   3. Insomnia 780.52 G47.00     Past Medical History:  Past Medical History:  Diagnosis Date  . Anxiety   . Depression   . Fatty liver   . GERD (gastroesophageal reflux disease)   . Hypothyroidism   . Obesity   . Thyroid disease     Past Surgical History:  Procedure Laterality Date  . APPENDECTOMY    . ESOPHAGOGASTRODUODENOSCOPY (EGD) WITH PROPOFOL N/A 12/20/2014   Procedure: ESOPHAGOGASTRODUODENOSCOPY (EGD) WITH PROPOFOL;  Surgeon: Manya Silvas, MD;  Location: [facility] ENDOSCOPY;  Service: Endoscopy;  Laterality: N/A;  . SAVORY DILATION N/A 12/20/2014   Procedure: SAVORY DILATION;  Surgeon: Manya Silvas, MD;  Location: [facility] ENDOSCOPY;  Service: Endoscopy;  Laterality: N/A;  . TONSILLECTOMY     Family History:  Family History  Problem Relation Age of Onset  . Diabetes Mother   . COPD Mother   . Kidney disease Mother   . Depression Mother   . Heart attack Father   . Aneurysm Father   . Anxiety disorder Sister   . Depression Sister    . Diabetes Sister   . Hypertension Sister   . Atrial fibrillation Brother   . Spinal muscular atrophy Brother   . Breast cancer Sister   . Bone cancer Sister   . Depression Sister   . Anxiety disorder Sister   . Breast cancer Sister   . Colon cancer Sister   . Depression Sister   . Diabetes Sister   . COPD Sister   . Depression Sister   . Anxiety disorder Sister   . Diabetes Brother   . Depression Brother    Social History:  Social History   Social History  . Marital status: Widowed    Spouse name: N/A  . Number of children: N/A  . Years of education: N/A   Social History Main Topics  . Smoking status: Never Smoker  . Smokeless tobacco: Never Used  . Alcohol use No  . Drug use: No  . Sexual activity: Not Currently    Birth control/ protection: Post-menopausal   Other Topics Concern  . None   Social History Narrative  . None   Additional History:   Assessment:   Musculoskeletal: Strength & Muscle Tone: decreased Gait & Station: In a wheelchair today  Patient leans: N/A  Psychiatric Specialty Exam: HPI  Review of Systems  Psychiatric/Behavioral: Positive for depression and memory loss. Negative for hallucinations, substance abuse and suicidal ideas. The patient is not nervous/anxious and does  not have insomnia.   All other systems reviewed and are negative.   Blood pressure 116/76, pulse 85, temperature 98.5 F (36.9 C), temperature source Oral.There is no height or weight on file to calculate BMI.  General Appearance: Well Groomed  Eye Contact:  Good  Speech:  Clear and Coherent  Volume:  Normal  Mood:  Reports depressed   Affect:  Pleasant   Thought Process:  Linear  Orientation:  Full (Time, Place, and Person)  Thought Content:  Negative  Suicidal Thoughts:  No  Homicidal Thoughts:  No  Memory:  Immediate;   Good Recent;   Good Remote;   Good  Judgement:  Negative  Insight:  Good  Psychomotor Activity:  Negative  Concentration:  Good   Recall:  Good  Fund of Knowledge: Good  Language: Good  Akathisia:  Negative  Handed:  Right unknown   AIMS (if indicated):   Done today, normal  Assets:  Communication Skills Desire for Improvement Social Support  ADL's:  Intact  Cognition: WNL  Sleep:  good   Is the patient at risk to self?  No. Has the patient been a risk to self in the past 6 months?  No. Has the patient been a risk to self within the distant past?  No. Is the patient a risk to others?  No. Has the patient been a risk to others in the past 6 months?  No. Has the patient been a risk to others within the distant past?  No.  Current Medications: Current Outpatient Prescriptions  Medication Sig Dispense Refill  . busPIRone (BUSPAR) 15 MG tablet Take 1 tablet (15 mg total) by mouth 2 (two) times daily. 180 tablet 0  . celecoxib (CELEBREX) 100 MG capsule TAKE ONE CAPSULE BY MOUTH TWICE A DAY    . donepezil (ARICEPT) 5 MG tablet     . escitalopram (LEXAPRO) 5 MG tablet Take 1 tablet (5 mg total) by mouth at bedtime. 30 tablet 1  . fluticasone (FLONASE) 50 MCG/ACT nasal spray Place 1 spray into both nostrils daily.    . hydrOXYzine (VISTARIL) 25 MG capsule Take 1 capsule (25 mg total) by mouth 3 (three) times daily as needed. 30 capsule 0  . KLOR-CON 10 10 MEQ tablet     . Lactulose 20 GM/30ML SOLN Take by mouth.    . levothyroxine (SYNTHROID, LEVOTHROID) 75 MCG tablet Take 75 mcg by mouth daily before breakfast.    . omeprazole (PRILOSEC) 20 MG capsule Take 20 mg by mouth 2 (two) times daily.    . QUEtiapine (SEROQUEL) 100 MG tablet Take 1 tablet (100 mg total) by mouth at bedtime. 30 tablet 1  . traZODone (DESYREL) 100 MG tablet Take 1 tablet (100 mg total) by mouth at bedtime. 30 tablet 1   No current facility-administered medications for this visit.     Medical Decision Making:  Review of Medication Regimen & Side Effects (2)  Treatment Plan Summary:Medication management and Plan   Major depressive  disorder- Discontinue the wellbutrin. Continue Seroquel at 116m once daily  Take the Lexapro at 541mpoq d. Discussed strategies to improve her mood. Encourage patient to sit near the window where there is a lot of natural light.   Generalized anxiety disorder-continue BuSpar 15 mg twice daily. She does state she feels this medication has been helpful to her.  Panic disorder-As above  Insomnia- continue trazodone to 100 mg at bedtime   RTC clinic in 1 months time or call before if  needed.    Damaris Geers 12/25/2015, 1:51 PM

## 2016-01-24 ENCOUNTER — Encounter: Payer: Self-pay | Admitting: Psychiatry

## 2016-01-24 ENCOUNTER — Ambulatory Visit (INDEPENDENT_AMBULATORY_CARE_PROVIDER_SITE_OTHER): Payer: Medicare Other | Admitting: Psychiatry

## 2016-01-24 VITALS — BP 116/74 | HR 73

## 2016-01-24 DIAGNOSIS — F5101 Primary insomnia: Secondary | ICD-10-CM

## 2016-01-24 DIAGNOSIS — F411 Generalized anxiety disorder: Secondary | ICD-10-CM

## 2016-01-24 DIAGNOSIS — F331 Major depressive disorder, recurrent, moderate: Secondary | ICD-10-CM | POA: Diagnosis not present

## 2016-01-24 MED ORDER — TRAZODONE HCL 100 MG PO TABS: 100.0000 mg | ORAL_TABLET | Freq: Every day | ORAL | 3 refills | Status: DC

## 2016-01-24 MED ORDER — QUETIAPINE FUMARATE 100 MG PO TABS: 100.0000 mg | ORAL_TABLET | Freq: Every day | ORAL | 3 refills | Status: DC

## 2016-01-24 MED ORDER — BUSPIRONE HCL 15 MG PO TABS: 15.0000 mg | ORAL_TABLET | Freq: Two times a day (BID) | ORAL | 0 refills | Status: DC

## 2016-01-24 MED ORDER — ESCITALOPRAM OXALATE 5 MG PO TABS: 5.0000 mg | ORAL_TABLET | Freq: Every day | ORAL | 3 refills | Status: DC

## 2016-01-24 NOTE — Progress Notes (Signed)
Patient ID: Cassidy [name], female   DOB: 18-Feb-1941, 75 y.o.   MRN: [id number]   [facility] MD/PA/NP OP Progress Note  01/24/2016 10:59 AM QUINTANA CANELO  MRN:  [id number]  Subjective:  Patient returns for follow-up of her major depressive disorder, generalized anxiety disorder, panic disorder and insomnia. Patient was seen with her grandson today. She reports that she is feeling much better and her mood has improved recently. She was taken off the Wellbutrin at her last visit and she reports it seems to have done her good. Grandson also reports that she seems history much better. She denies any suicidal thoughts. States she is eating better.  Chief Complaint: Doing better  Visit Diagnosis:     ICD-9-CM ICD-10-CM   1. Major depressive disorder, recurrent episode, moderate (HCC) 296.32 F33.1   2. GAD (generalized anxiety disorder) 300.02 F41.1   3. Primary insomnia 307.42 F51.01     Past Medical History:  Past Medical History:  Diagnosis Date  . Anxiety   . Depression   . Fatty liver   . GERD (gastroesophageal reflux disease)   . Hypothyroidism   . Obesity   . Thyroid disease     Past Surgical History:  Procedure Laterality Date  . APPENDECTOMY    . ESOPHAGOGASTRODUODENOSCOPY (EGD) WITH PROPOFOL N/A 12/20/2014   Procedure: ESOPHAGOGASTRODUODENOSCOPY (EGD) WITH PROPOFOL;  Surgeon: Manya Silvas, MD;  Location: [facility] ENDOSCOPY;  Service: Endoscopy;  Laterality: N/A;  . SAVORY DILATION N/A 12/20/2014   Procedure: SAVORY DILATION;  Surgeon: Manya Silvas, MD;  Location: [facility] ENDOSCOPY;  Service: Endoscopy;  Laterality: N/A;  . TONSILLECTOMY     Family History:  Family History  Problem Relation Age of Onset  . Diabetes Mother   . COPD Mother   . Kidney disease Mother   . Depression Mother   . Heart attack Father   . Aneurysm Father   . Anxiety disorder Sister   . Depression Sister   . Diabetes Sister   . Hypertension Sister   . Atrial fibrillation Brother   . Spinal muscular  atrophy Brother   . Breast cancer Sister   . Bone cancer Sister   . Depression Sister   . Anxiety disorder Sister   . Breast cancer Sister   . Colon cancer Sister   . Depression Sister   . Diabetes Sister   . COPD Sister   . Depression Sister   . Anxiety disorder Sister   . Diabetes Brother   . Depression Brother    Social History:  Social History   Social History  . Marital status: Widowed    Spouse name: N/A  . Number of children: N/A  . Years of education: N/A   Social History Main Topics  . Smoking status: Never Smoker  . Smokeless tobacco: Never Used  . Alcohol use No  . Drug use: No  . Sexual activity: Not Currently    Birth control/ protection: Post-menopausal   Other Topics Concern  . None   Social History Narrative  . None   Additional History:   Assessment:   Musculoskeletal: Strength & Muscle Tone: decreased Gait & Station: In a wheelchair today  Patient leans: N/A  Psychiatric Specialty Exam: HPI  Review of Systems  Psychiatric/Behavioral: Positive for memory loss. Negative for depression, hallucinations, substance abuse and suicidal ideas. The patient is not nervous/anxious and does not have insomnia.   All other systems reviewed and are negative.   Blood pressure 116/74, pulse 73.There is no  height or weight on file to calculate BMI.  General Appearance: Well Groomed  Eye Contact:  Good  Speech:  Clear and Coherent  Volume:  Normal  Mood:  Much improved  Affect:  Pleasant   Thought Process:  Linear  Orientation:  Full (Time, Place, and Person)  Thought Content:  Negative  Suicidal Thoughts:  No  Homicidal Thoughts:  No  Memory:  Immediate;   Good Recent;   Good Remote;   Good  Judgement:  Negative  Insight:  Good  Psychomotor Activity:  Negative  Concentration:  Good  Recall:  Good  Fund of Knowledge: Good  Language: Good  Akathisia:  Negative  Handed:  Right unknown   AIMS (if indicated):   Done today, normal  Assets:   Communication Skills Desire for Improvement Social Support  ADL's:  Intact  Cognition: WNL  Sleep:  good   Is the patient at risk to self?  No. Has the patient been a risk to self in the past 6 months?  No. Has the patient been a risk to self within the distant past?  No. Is the patient a risk to others?  No. Has the patient been a risk to others in the past 6 months?  No. Has the patient been a risk to others within the distant past?  No.  Current Medications: Current Outpatient Prescriptions  Medication Sig Dispense Refill  . busPIRone (BUSPAR) 15 MG tablet Take 1 tablet (15 mg total) by mouth 2 (two) times daily. 180 tablet 0  . celecoxib (CELEBREX) 100 MG capsule TAKE ONE CAPSULE BY MOUTH TWICE A DAY    . donepezil (ARICEPT) 5 MG tablet     . escitalopram (LEXAPRO) 5 MG tablet Take 1 tablet (5 mg total) by mouth at bedtime. 30 tablet 3  . fluticasone (FLONASE) 50 MCG/ACT nasal spray Place 1 spray into both nostrils daily.    . hydrOXYzine (VISTARIL) 25 MG capsule Take 1 capsule (25 mg total) by mouth 3 (three) times daily as needed. 30 capsule 0  . KLOR-CON 10 10 MEQ tablet     . Lactulose 20 GM/30ML SOLN Take by mouth.    . levothyroxine (SYNTHROID, LEVOTHROID) 75 MCG tablet Take 75 mcg by mouth daily before breakfast.    . omeprazole (PRILOSEC) 20 MG capsule Take 20 mg by mouth 2 (two) times daily.    . QUEtiapine (SEROQUEL) 100 MG tablet Take 1 tablet (100 mg total) by mouth at bedtime. 30 tablet 3  . traZODone (DESYREL) 100 MG tablet Take 1 tablet (100 mg total) by mouth at bedtime. 30 tablet 3   No current facility-administered medications for this visit.     Medical Decision Making:  Review of Medication Regimen & Side Effects (2)  Treatment Plan Summary:Medication management and Plan   Major depressive disorder-  Continue Seroquel at 167m once daily  Take the Lexapro at 527mpoq d.   Generalized anxiety disorder-continue BuSpar 15 mg twice daily. She does state  she feels this medication has been helpful to her.  Panic disorder-As above  Insomnia- continue trazodone to 100 mg at bedtime   RTC clinic in 4 months time or call before if needed.    Judi Jaffe 01/24/2016, 10:59 AM

## 2016-01-26 ENCOUNTER — Other Ambulatory Visit: Payer: Self-pay | Admitting: Psychiatry

## 2016-04-01 ENCOUNTER — Telehealth: Payer: Self-pay

## 2016-04-01 NOTE — Telephone Encounter (Signed)
left message given ok to refill all the medication 90 day supply with no additional refills. left message given ok to refill all the medication 90 day supply with no additional refills.

## 2016-04-01 NOTE — Telephone Encounter (Signed)
received a fax requesting 90 day supply on pt escitalopram 47m , trazodone 1046m quetiapine 10059m

## 2016-04-01 NOTE — Telephone Encounter (Signed)
called and canceled the three rx that was [facility]  orginal sent to [facility] so I canceled since pt wanted rx [facility] 90 day supply.

## 2016-04-16 ENCOUNTER — Other Ambulatory Visit
Admission: RE | Admit: 2016-04-16 | Discharge: 2016-04-16 | Disposition: A | Discharge status: Home / Self Care | Payer: Medicare Other | Source: Ambulatory Visit | Attending: Internal Medicine | Admitting: Internal Medicine

## 2016-04-16 DIAGNOSIS — D696 Thrombocytopenia, unspecified: Secondary | ICD-10-CM | POA: Diagnosis present

## 2016-04-16 DIAGNOSIS — Z0000 Encounter for general adult medical examination without abnormal findings: Secondary | ICD-10-CM | POA: Diagnosis present

## 2016-04-16 DIAGNOSIS — K7290 Hepatic failure, unspecified without coma: Secondary | ICD-10-CM | POA: Insufficient documentation

## 2016-04-16 DIAGNOSIS — E039 Hypothyroidism, unspecified: Secondary | ICD-10-CM | POA: Insufficient documentation

## 2016-04-16 LAB — AMMONIA: AMMONIA: 30 umol/L (ref 9–35)

## 2016-05-26 ENCOUNTER — Ambulatory Visit (INDEPENDENT_AMBULATORY_CARE_PROVIDER_SITE_OTHER): Payer: Medicare Other | Admitting: Psychiatry

## 2016-05-26 ENCOUNTER — Encounter: Payer: Self-pay | Admitting: Psychiatry

## 2016-05-26 VITALS — BP 114/74 | HR 82 | Temp 98.3°F

## 2016-05-26 DIAGNOSIS — F331 Major depressive disorder, recurrent, moderate: Secondary | ICD-10-CM

## 2016-05-26 MED ORDER — ESCITALOPRAM OXALATE 5 MG PO TABS: 5.0000 mg | ORAL_TABLET | Freq: Every day | ORAL | 3 refills | Status: DC

## 2016-05-26 MED ORDER — TRAZODONE HCL 100 MG PO TABS: 100.0000 mg | ORAL_TABLET | Freq: Every day | ORAL | 3 refills | Status: DC

## 2016-05-26 MED ORDER — BUSPIRONE HCL 15 MG PO TABS: 15.0000 mg | ORAL_TABLET | Freq: Two times a day (BID) | ORAL | 0 refills | Status: DC

## 2016-05-26 MED ORDER — QUETIAPINE FUMARATE 100 MG PO TABS: 100.0000 mg | ORAL_TABLET | Freq: Every day | ORAL | 3 refills | Status: DC

## 2016-05-26 NOTE — Progress Notes (Signed)
Patient ID: Cassidy [name], female   DOB: 1941/01/10, 76 y.o.   MRN: [id number]   [facility] MD/PA/NP OP Progress Note  05/26/2016 11:11 AM Cassidy [name]  MRN:  [id number]  Subjective:  Patient returns for follow-up of her major depressive disorder, generalized anxiety disorder, panic disorder and insomnia. Patient was seen with her grandson today. She reports that she is doing okay. Her grandson reports that she continues to have some trouble eating but seems to be doing okay overall. He reports that she does have trouble with remembering what day of the week it is. However she does not have memory issues regarding other issues. He does understand that patient may BE lonely and would benefit from being with people. However patient not interested in doing any other activities.   Chief Complaint: Doing okay.  Chief Complaint    Follow-up; Medication Refill     Visit Diagnosis:     ICD-9-CM ICD-10-CM   1. Major depressive disorder, recurrent episode, moderate (HCC) 296.32 F33.1     Past Medical History:  Past Medical History:  Diagnosis Date  . Anxiety   . Depression   . Fatty liver   . GERD (gastroesophageal reflux disease)   . Hypothyroidism   . Obesity   . Thyroid disease     Past Surgical History:  Procedure Laterality Date  . APPENDECTOMY    . ESOPHAGOGASTRODUODENOSCOPY (EGD) WITH PROPOFOL N/A 12/20/2014   Procedure: ESOPHAGOGASTRODUODENOSCOPY (EGD) WITH PROPOFOL;  Surgeon: Manya Silvas, MD;  Location: [facility] ENDOSCOPY;  Service: Endoscopy;  Laterality: N/A;  . SAVORY DILATION N/A 12/20/2014   Procedure: SAVORY DILATION;  Surgeon: Manya Silvas, MD;  Location: [facility] ENDOSCOPY;  Service: Endoscopy;  Laterality: N/A;  . TONSILLECTOMY     Family History:  Family History  Problem Relation Age of Onset  . Diabetes Mother   . COPD Mother   . Kidney disease Mother   . Depression Mother   . Heart attack Father   . Aneurysm Father   . Anxiety disorder Sister   . Depression Sister    . Diabetes Sister   . Hypertension Sister   . Atrial fibrillation Brother   . Spinal muscular atrophy Brother   . Breast cancer Sister   . Bone cancer Sister   . Depression Sister   . Anxiety disorder Sister   . Breast cancer Sister   . Colon cancer Sister   . Depression Sister   . Diabetes Sister   . COPD Sister   . Depression Sister   . Anxiety disorder Sister   . Diabetes Brother   . Depression Brother    Social History:  Social History   Social History  . Marital status: Widowed    Spouse name: N/A  . Number of children: N/A  . Years of education: N/A   Social History Main Topics  . Smoking status: Never Smoker  . Smokeless tobacco: Never Used  . Alcohol use No  . Drug use: No  . Sexual activity: Not Currently    Birth control/ protection: Post-menopausal   Other Topics Concern  . None   Social History Narrative  . None   Additional History:   Assessment:   Musculoskeletal: Strength & Muscle Tone: decreased Gait & Station: In a wheelchair today  Patient leans: N/A  Psychiatric Specialty Exam: HPI  Review of Systems  Psychiatric/Behavioral: Positive for memory loss. Negative for depression, hallucinations, substance abuse and suicidal ideas. The patient is not nervous/anxious and does not have  insomnia.   All other systems reviewed and are negative.   Blood pressure 114/74, pulse 82, temperature 98.3 F (36.8 C), temperature source Oral.There is no height or weight on file to calculate BMI.  General Appearance: Well Groomed  Eye Contact:  Good  Speech:  Clear and Coherent  Volume:  Normal  Mood:  okay  Affect:  Pleasant   Thought Process:  Linear  Orientation:  Full (Time, Place, and Person)  Thought Content:  Negative  Suicidal Thoughts:  No  Homicidal Thoughts:  No  Memory:  Immediate;   Good Recent;   Good Remote;   Good  Judgement:  Negative  Insight:  Good  Psychomotor Activity:  Negative  Concentration:  Good  Recall:  Good  Fund  of Knowledge: Good  Language: Good  Akathisia:  Negative  Handed:  Right unknown   AIMS (if indicated):   Done today, normal, no stiffness noted  Assets:  Communication Skills Desire for Improvement Social Support  ADL's:  Intact  Cognition: WNL  Sleep:  good   Is the patient at risk to self?  No. Has the patient been a risk to self in the past 6 months?  No. Has the patient been a risk to self within the distant past?  No. Is the patient a risk to others?  No. Has the patient been a risk to others in the past 6 months?  No. Has the patient been a risk to others within the distant past?  No.  Current Medications: Current Outpatient Prescriptions  Medication Sig Dispense Refill  . busPIRone (BUSPAR) 15 MG tablet Take 1 tablet (15 mg total) by mouth 2 (two) times daily. 180 tablet 0  . celecoxib (CELEBREX) 100 MG capsule TAKE ONE CAPSULE BY MOUTH TWICE A DAY    . donepezil (ARICEPT) 5 MG tablet     . escitalopram (LEXAPRO) 5 MG tablet Take 1 tablet (5 mg total) by mouth at bedtime. 30 tablet 3  . fluticasone (FLONASE) 50 MCG/ACT nasal spray Place 1 spray into both nostrils daily.    . hydrOXYzine (VISTARIL) 25 MG capsule Take 1 capsule (25 mg total) by mouth 3 (three) times daily as needed. 30 capsule 0  . KLOR-CON 10 10 MEQ tablet     . Lactulose 20 GM/30ML SOLN Take by mouth.    . levothyroxine (SYNTHROID, LEVOTHROID) 75 MCG tablet Take 75 mcg by mouth daily before breakfast.    . omeprazole (PRILOSEC) 20 MG capsule Take 20 mg by mouth 2 (two) times daily.    . QUEtiapine (SEROQUEL) 100 MG tablet Take 1 tablet (100 mg total) by mouth at bedtime. 30 tablet 3  . traZODone (DESYREL) 100 MG tablet Take 1 tablet (100 mg total) by mouth at bedtime. 30 tablet 3   No current facility-administered medications for this visit.     Medical Decision Making:  Review of Medication Regimen & Side Effects (2)  Treatment Plan Summary:Medication management and Plan   Major depressive  disorder-  Continue Seroquel at 185m once daily  Take the Lexapro at 539mpoq d.   Generalized anxiety disorder-continue BuSpar 15 mg twice daily. She does state she feels this medication has been helpful to her.  Panic disorder-As above  Insomnia- continue trazodone to 100 mg at bedtime   RTC clinic in 4 months time or call before if needed.    Cedricka Sackrider 05/26/2016, 11:11 AM

## 2016-06-17 ENCOUNTER — Telehealth: Payer: Self-pay

## 2016-06-17 NOTE — Telephone Encounter (Signed)
stakes she needs a newer appt then 4 months she doesn't think she is going to be here at this rate she so down and depress.  she wants you to call her.

## 2016-06-17 NOTE — Telephone Encounter (Signed)
pt sisiter called states that pt is not eating not drinking.  she wants to go ahead and do the treatment that you had discussed with them .

## 2016-06-17 NOTE — Telephone Encounter (Signed)
Since it sounds like patient has been decompensating if she is not eating or drinking. Patient will need to be hospitalized to stabilize her and consider ECT. Please call patient's sister and discussed this plan with her.

## 2016-06-17 NOTE — Telephone Encounter (Signed)
pt sister was called and told to go to er.  She states that she will no go to er.  Can you just talk with dr. Weber Cooks and see if he do ect.

## 2016-06-18 NOTE — Telephone Encounter (Signed)
Based on the the discussion yesterday and the report that patient has not been eating and drinking she needs to be hospitalized. She can come in as a walk-in today at 1:00 and I can see her, again most probably she will need to be hospitalized and they have to be prepared for that. If she will not go in voluntarily and will have to take an involuntary commitment

## 2016-06-18 NOTE — Telephone Encounter (Signed)
Patient's sister Cassidy [name] has been called and discussed patient's condition. Patient's sister reports that they have an appointment with her primary care physician to have her evaluated. If her current symptoms are due to depression recommended that they have her hospitalized on the behavioral health unit to stabilize patient . Patient's sister. [name] is in agreement with this plan

## 2016-06-18 NOTE — Telephone Encounter (Signed)
Sister name is [name] [phone]

## 2016-06-19 ENCOUNTER — Encounter: Payer: Self-pay | Admitting: Psychiatry

## 2016-06-19 ENCOUNTER — Telehealth: Payer: Self-pay

## 2016-06-19 ENCOUNTER — Emergency Department
Admission: EM | Admit: 2016-06-19 | Discharge: 2016-06-20 | Disposition: A | Discharge status: Other Acute Inpatient Hospital | Payer: Medicare Other | Attending: Emergency Medicine | Admitting: Emergency Medicine

## 2016-06-19 ENCOUNTER — Ambulatory Visit (INDEPENDENT_AMBULATORY_CARE_PROVIDER_SITE_OTHER): Payer: Medicare Other | Admitting: Psychiatry

## 2016-06-19 ENCOUNTER — Encounter: Payer: Self-pay | Admitting: Emergency Medicine

## 2016-06-19 VITALS — BP 126/75 | HR 81 | Temp 98.3°F | Wt 178.4 lb

## 2016-06-19 DIAGNOSIS — F332 Major depressive disorder, recurrent severe without psychotic features: Secondary | ICD-10-CM

## 2016-06-19 DIAGNOSIS — Z79899 Other long term (current) drug therapy: Secondary | ICD-10-CM | POA: Diagnosis not present

## 2016-06-19 DIAGNOSIS — R8299 Other abnormal findings in urine: Secondary | ICD-10-CM | POA: Insufficient documentation

## 2016-06-19 DIAGNOSIS — Z046 Encounter for general psychiatric examination, requested by authority: Secondary | ICD-10-CM | POA: Diagnosis present

## 2016-06-19 DIAGNOSIS — F331 Major depressive disorder, recurrent, moderate: Secondary | ICD-10-CM

## 2016-06-19 DIAGNOSIS — K219 Gastro-esophageal reflux disease without esophagitis: Secondary | ICD-10-CM | POA: Diagnosis present

## 2016-06-19 DIAGNOSIS — F411 Generalized anxiety disorder: Secondary | ICD-10-CM | POA: Diagnosis not present

## 2016-06-19 DIAGNOSIS — F339 Major depressive disorder, recurrent, unspecified: Secondary | ICD-10-CM | POA: Insufficient documentation

## 2016-06-19 DIAGNOSIS — R200 Anesthesia of skin: Secondary | ICD-10-CM

## 2016-06-19 DIAGNOSIS — D649 Anemia, unspecified: Secondary | ICD-10-CM | POA: Diagnosis present

## 2016-06-19 DIAGNOSIS — E039 Hypothyroidism, unspecified: Secondary | ICD-10-CM | POA: Diagnosis present

## 2016-06-19 LAB — CBC
HCT: 38.7 % (ref 35.0–47.0)
Hemoglobin: 13.5 g/dL (ref 12.0–16.0)
MCH: 30.6 pg (ref 26.0–34.0)
MCHC: 34.8 g/dL (ref 32.0–36.0)
MCV: 88 fL (ref 80.0–100.0)
PLATELETS: 58 10*3/uL — AB (ref 150–440)
RBC: 4.4 MIL/uL (ref 3.80–5.20)
RDW: 17.3 % — ABNORMAL HIGH (ref 11.5–14.5)
WBC: 2.5 10*3/uL — ABNORMAL LOW (ref 3.6–11.0)

## 2016-06-19 LAB — URINALYSIS, COMPLETE (UACMP) WITH MICROSCOPIC
Bilirubin Urine: NEGATIVE
GLUCOSE, UA: NEGATIVE mg/dL
Ketones, ur: NEGATIVE mg/dL
Leukocytes, UA: NEGATIVE
NITRITE: POSITIVE — AB
PH: 5 (ref 5.0–8.0)
Protein, ur: NEGATIVE mg/dL
SPECIFIC GRAVITY, URINE: 1.018 (ref 1.005–1.030)

## 2016-06-19 LAB — COMPREHENSIVE METABOLIC PANEL
ALK PHOS: 91 U/L (ref 38–126)
ALT: 26 U/L (ref 14–54)
ANION GAP: 6 (ref 5–15)
AST: 45 U/L — ABNORMAL HIGH (ref 15–41)
Albumin: 3.5 g/dL (ref 3.5–5.0)
BUN: 13 mg/dL (ref 6–20)
CALCIUM: 8.7 mg/dL — AB (ref 8.9–10.3)
CHLORIDE: 106 mmol/L (ref 101–111)
CO2: 27 mmol/L (ref 22–32)
CREATININE: 0.74 mg/dL (ref 0.44–1.00)
Glucose, Bld: 119 mg/dL — ABNORMAL HIGH (ref 65–99)
Potassium: 3.9 mmol/L (ref 3.5–5.1)
SODIUM: 139 mmol/L (ref 135–145)
Total Bilirubin: 1.5 mg/dL — ABNORMAL HIGH (ref 0.3–1.2)
Total Protein: 6.9 g/dL (ref 6.5–8.1)

## 2016-06-19 LAB — URINE DRUG SCREEN, QUALITATIVE (ARMC ONLY)
Amphetamines, Ur Screen: NOT DETECTED
BARBITURATES, UR SCREEN: NOT DETECTED
BENZODIAZEPINE, UR SCRN: NOT DETECTED
CANNABINOID 50 NG, UR ~~LOC~~: NOT DETECTED
Cocaine Metabolite,Ur ~~LOC~~: NOT DETECTED
MDMA (Ecstasy)Ur Screen: NOT DETECTED
METHADONE SCREEN, URINE: NOT DETECTED
Opiate, Ur Screen: NOT DETECTED
Phencyclidine (PCP) Ur S: NOT DETECTED
TRICYCLIC, UR SCREEN: POSITIVE — AB

## 2016-06-19 LAB — ETHANOL

## 2016-06-19 LAB — SALICYLATE LEVEL

## 2016-06-19 LAB — ACETAMINOPHEN LEVEL

## 2016-06-19 MED ORDER — DONEPEZIL HCL 5 MG PO TABS
5.0000 mg | ORAL_TABLET | Freq: Every day | ORAL | Status: DC
  Administered 2016-06-19 – 2016-06-20 (×2): 5 mg via ORAL
  Filled 2016-06-19 (×2): qty 1

## 2016-06-19 MED ORDER — LEVOTHYROXINE SODIUM 50 MCG PO TABS
75.0000 ug | ORAL_TABLET | Freq: Every day | ORAL | Status: DC
  Administered 2016-06-20: 75 ug via ORAL
  Filled 2016-06-19: qty 2

## 2016-06-19 MED ORDER — PANTOPRAZOLE SODIUM 40 MG PO TBEC
40.0000 mg | DELAYED_RELEASE_TABLET | Freq: Every day | ORAL | Status: DC
Start: 2016-06-19 — End: 2016-06-20
  Administered 2016-06-19 – 2016-06-20 (×2): 40 mg via ORAL
  Filled 2016-06-19 (×2): qty 1

## 2016-06-19 MED ORDER — ESCITALOPRAM OXALATE 10 MG PO TABS
10.0000 mg | ORAL_TABLET | Freq: Every day | ORAL | Status: DC
  Administered 2016-06-19 – 2016-06-20 (×2): 10 mg via ORAL
  Filled 2016-06-19 (×2): qty 1

## 2016-06-19 MED ORDER — FLUTICASONE PROPIONATE 50 MCG/ACT NA SUSP
2.0000 | Freq: Every day | NASAL | Status: DC
  Administered 2016-06-20: 2 via NASAL
  Filled 2016-06-19: qty 16

## 2016-06-19 MED ORDER — CELECOXIB 100 MG PO CAPS
100.0000 mg | ORAL_CAPSULE | Freq: Every day | ORAL | Status: DC
  Administered 2016-06-20: 100 mg via ORAL
  Filled 2016-06-19: qty 1

## 2016-06-19 MED ORDER — POTASSIUM CHLORIDE CRYS ER 20 MEQ PO TBCR: 10.0000 meq | EXTENDED_RELEASE_TABLET | Freq: Once | ORAL | Status: DC

## 2016-06-19 MED ORDER — BUSPIRONE HCL 15 MG PO TABS
15.0000 mg | ORAL_TABLET | Freq: Two times a day (BID) | ORAL | Status: DC
  Administered 2016-06-20: 15 mg via ORAL
  Filled 2016-06-19 (×3): qty 1

## 2016-06-19 MED ORDER — QUETIAPINE FUMARATE 25 MG PO TABS
100.0000 mg | ORAL_TABLET | Freq: Every day | ORAL | Status: DC
  Administered 2016-06-19: 100 mg via ORAL
  Filled 2016-06-19: qty 4

## 2016-06-19 NOTE — ED Notes (Signed)
Cassidy [name], patent's sister:  [phone]

## 2016-06-19 NOTE — BH Assessment (Addendum)
Assessment Note  Cassidy [name] is an 75 y.o. female who presents to the ER, via her sister, after her outpatient psychiatrist saw her. According to the patient, she have lost her appetite and motivation to do things. She spends majority of her time in her home and isolated from others.  According to the patient sister ([name]), since the death of her youngest daughter Narda Amber), approximately a year ago, the patient has dealt with depression. However, for the last two to three months, they have noticed a sharp declined. She no longer go out with family, she's not eating and they have to make her do things. "She will spend all her time in the house. It's like a dungeon in there (home)." The house is usually dark and warm." Sister also states, "Jennifer's (patient daughter's) birthday is coming up to (March 6th) and that may have something to do with it to."  Patient's grandson lives in the home but he doesn't spend that much time there. He's very respectful and helps when he can but family feels he hasn't been there as much, "cause he have his own life to live." In the past, when her husband past (13 years ago), she had the grandson to "take care of and raise." Thus, she was able to handle that lost better than this one. Family believes, because she do not have any major responsibilities and no particular reason to keep going, "she is giving up on life."  Patient reports of having thoughts of dying but has had no intent to do so. She states, she will not try to end her life, due to her love for her grandson. "I wouldn't want to do him like that." When asked if she had a plan, she stated, "If I was to do it, I'll take a bunch of pills."  During the interview, she was calm, cooperative and pleasant. She denies having any history of aggression and violence.  Diagnosis: Depression  Past Medical History:  Past Medical History:  Diagnosis Date  . Anxiety   . Depression   . Fatty liver   .  GERD (gastroesophageal reflux disease)   . Hypothyroidism   . Obesity   . Thyroid disease     Past Surgical History:  Procedure Laterality Date  . APPENDECTOMY    . ESOPHAGOGASTRODUODENOSCOPY (EGD) WITH PROPOFOL N/A 12/20/2014   Procedure: ESOPHAGOGASTRODUODENOSCOPY (EGD) WITH PROPOFOL;  Surgeon: Manya Silvas, MD;  Location: [facility] ENDOSCOPY;  Service: Endoscopy;  Laterality: N/A;  . SAVORY DILATION N/A 12/20/2014   Procedure: SAVORY DILATION;  Surgeon: Manya Silvas, MD;  Location: [facility] ENDOSCOPY;  Service: Endoscopy;  Laterality: N/A;  . TONSILLECTOMY      Family History:  Family History  Problem Relation Age of Onset  . Diabetes Mother   . COPD Mother   . Kidney disease Mother   . Depression Mother   . Heart attack Father   . Aneurysm Father   . Anxiety disorder Sister   . Depression Sister   . Diabetes Sister   . Hypertension Sister   . Atrial fibrillation Brother   . Spinal muscular atrophy Brother   . Breast cancer Sister   . Bone cancer Sister   . Depression Sister   . Anxiety disorder Sister   . Breast cancer Sister   . Colon cancer Sister   . Depression Sister   . Diabetes Sister   . COPD Sister   . Depression Sister   . Anxiety disorder Sister   .  Diabetes Brother   . Depression Brother     Social History:  reports that she has never smoked. She has never used smokeless tobacco. She reports that she does not drink alcohol or use drugs.  Additional Social History:  Alcohol / Drug Use Pain Medications: See PTA Prescriptions: See PTA Over the Counter: See PTA History of alcohol / drug use?: No history of alcohol / drug abuse Longest period of sobriety (when/how long): None Reported Negative Consequences of Use:  (n/a) Withdrawal Symptoms:  (n/a)  CIWA: CIWA-Ar BP: (!) 127/46 Pulse Rate: 78 COWS:    Allergies:  Allergies  Allergen Reactions  . Sulfa Antibiotics Shortness Of Breath    Home Medications:  (Not in a hospital  admission)  OB/GYN Status:  No LMP recorded (lmp unknown). Patient is postmenopausal.  General Assessment Data Location of Assessment: [facility] ED TTS Assessment: In system Is this a Tele or Face-to-Face Assessment?: Face-to-Face Is this an Initial Assessment or a Re-assessment for this encounter?: Initial Assessment Marital status: Single Maiden name: n/a Is patient pregnant?: No Pregnancy Status: No Living Arrangements: Alone Can pt return to current living arrangement?: Yes Admission Status: Involuntary Is patient capable of signing voluntary admission?: No Referral Source: Psychiatrist Insurance type: [facility]  Medical Screening Exam ([place]) Medical Exam completed: Yes  Crisis Care Plan Living Arrangements: Alone Legal Guardian: Other: (Self) Name of Psychiatrist: Dr. Einar Grad ([place]) Name of Therapist: [place]  Education Status Is patient currently in school?: No Current Grade: n/a Highest grade of school patient has completed: [employer] Dipl[employer] Name of school: n/a Contact person: n/a  Risk to self with the past 6 months Suicidal Ideation: Yes-Currently Present Has patient been a risk to self within the past 6 months prior to admission? : No Suicidal Intent: No Has patient had any suicidal intent within the past 6 months prior to admission? : No Is patient at risk for suicide?: No Suicidal Plan?: No-Not Currently/Within Last 6 Months Has patient had any suicidal plan within the past 6 months prior to admission? : Yes Access to Means: Yes Specify Access to Suicidal Means: Overdose on medications but do not wish to do it. What has been your use of drugs/alcohol within the last 12 months?: Reports of none Previous Attempts/Gestures: No How many times?: 0 Other Self Harm Risks: Reports of none Triggers for Past Attempts: Other (Comment) (Daughter passed approximately a year ago.) Intentional Self Injurious  Behavior: None Family Suicide History: No Recent stressful life event(s): Other (Comment) (Daughter passed approximately a year ago.) Persecutory voices/beliefs?: No Depression: Yes Depression Symptoms: Insomnia, Tearfulness, Isolating, Fatigue, Loss of interest in usual pleasures, Feeling worthless/self pity Substance abuse history and/or treatment for substance abuse?: No Suicide prevention information given to non-admitted patients: Not applicable  Risk to Others within the past 6 months Homicidal Ideation: No Does patient have any lifetime risk of violence toward others beyond the six months prior to admission? : No Thoughts of Harm to Others: No Current Homicidal Intent: No Current Homicidal Plan: No Access to Homicidal Means: No Identified Victim: Reports of none History of harm to others?: No Assessment of Violence: None Noted Violent Behavior Description: Reports of none Does patient have access to weapons?: No Criminal Charges Pending?: No Does patient have a court date: No Is patient on probation?: No  Psychosis Hallucinations: None noted Delusions: None noted  Mental Status Report Appearance/Hygiene: Unremarkable, In scrubs Eye Contact: Fair Motor Activity: Unable to assess (Patient  laying in the bed) Speech: Logical/coherent, Unremarkable Level of Consciousness: Alert Mood: Depressed, Anxious, Helpless, Pleasant, Sad, Empty Affect: Anxious, Appropriate to circumstance, Depressed, Sad Anxiety Level: Minimal Thought Processes: Coherent, Relevant Judgement: Partial Orientation: Person, Place, Time, Situation, Appropriate for developmental age Obsessive Compulsive Thoughts/Behaviors: Minimal  Cognitive Functioning Concentration: Normal Memory: Recent Intact, Remote Intact IQ: Average Insight: Fair Impulse Control: Fair Appetite: Fair Weight Loss: 0 Weight Gain: 0 Sleep: Decreased Total Hours of Sleep: 4 ("For the last year of so") Vegetative Symptoms:  None  ADLScreening [facility] Assessment Services) Patient's cognitive ability adequate to safely complete daily activities?: Yes Patient able to express need for assistance with ADLs?: Yes Independently performs ADLs?: No  Prior Inpatient Therapy Prior Inpatient Therapy: Yes Prior Therapy Dates: 2017 Prior Therapy Facilty/Provider(s): [facility] Reason for Treatment: Depression  Prior Outpatient Therapy Prior Outpatient Therapy: No Prior Therapy Dates: Current Prior Therapy Facilty/Provider([facility]): [facility]  Reason for Treatment: Depression Does patient have an ACCT team?: No Does patient have Intensive In-House Services?  : No Does patient have [facility] services? : No Does patient have P4CC services?: No  ADL Screening (condition at time of admission) Patient's cognitive ability adequate to safely complete daily activities?: Yes Is the patient deaf or have difficulty hearing?: No Does the patient have difficulty seeing, even when wearing glasses/contacts?: No Does the patient have difficulty concentrating, remembering, or making decisions?: No Patient able to express need for assistance with ADLs?: Yes Does the patient have difficulty dressing or bathing?: No Independently performs ADLs?: No Communication: Independent Dressing (OT): Independent Grooming: Independent Feeding: Independent Bathing: Independent Toileting: Independent In/Out Bed: Independent Walks in Home: Independent Does the patient have difficulty walking or climbing stairs?: No Weakness of Legs: Both Weakness of Arms/Hands: None  Home Assistive Devices/Equipment Home Assistive Devices/Equipment: None  Therapy Consults (therapy consults require a physician order) PT Evaluation Needed: No OT Evalulation Needed: No SLP Evaluation Needed: No Abuse/Neglect Assessment (Assessment to be complete while patient is alone) Physical Abuse: Denies Verbal Abuse: Denies Sexual Abuse:  Denies Exploitation of patient/patient's resources: Denies Self-Neglect: Denies Values / Beliefs Cultural Requests During Hospitalization: None Spiritual Requests During Hospitalization: None Consults Spiritual Care Consult Needed: No Social Work Consult Needed: No Regulatory affairs officer (For Healthcare) Does Patient Have a Medical Advance Directive?: No Would patient like information on creating a medical advance directive?: No - Patient declined    Additional Information 1:1 In Past 12 Months?: No CIRT Risk: No Elopement Risk: No Does patient have medical clearance?: Yes  Child/Adolescent Assessment Running Away Risk: Denies (Patient is an adult)  Disposition:  Disposition Initial Assessment Completed for this Encounter: Yes Disposition of Patient: Other dispositions (ER MD Ordered Psych MD)  On Site Evaluation by:   Reviewed with Physician:    Gunnar Fusi MS, LCAS, [employer], [place], CCSI Therapeutic Triage Specialist 06/19/2016 2:53 PM

## 2016-06-19 NOTE — Telephone Encounter (Signed)
Pt came into office today an she was taken to er

## 2016-06-19 NOTE — ED Provider Notes (Signed)
[facility] Emergency Department Provider Note  ____________________________________________   First MD Initiated Contact with Patient 06/19/16 1056     (approximate)  I have reviewed the triage vital signs and the nursing notes.   HISTORY  Chief Complaint Suicidal   HPI Cassidy [name] is a 76 y.o. female here for evaluation of suicidal thoughts  Patient reports he's been having depression for a long time, her psychiatrist has referred her here because of concerns for worsening depression suicidal ideation. She is on treatment, but reports symptoms are worsening, and she also reports she recently told her sister she is having thoughts of suicide but doesn't have a clear plan reports she wouldn't go through with it because her grandson, who is this 48s, wouldn't be able to deal with her death.  Denies any recent illness. No fevers or chills. No headache, no nausea or vomiting. No trouble urinating or painful burning urination   Past Medical History:  Diagnosis Date  . Anxiety   . Depression   . Fatty liver   . GERD (gastroesophageal reflux disease)   . Hypothyroidism   . Obesity   . Thyroid disease     Patient Active Problem List   Diagnosis Date Noted  . Hepatic encephalopathy ([place]) 08/13/2015  . Thrombocytopenia ([place]) 07/23/2015  . Amnesia 03/20/2015  . Major depression in remission ([place]) 03/12/2015  . Gonalgia 01/05/2015  . Absolute anemia 11/27/2014  . Arthritis 11/27/2014  . Acid reflux 11/27/2014  . Depression, major, recurrent, in partial remission ([place]) 11/27/2014  . Encounter for general adult medical examination without abnormal findings 08/25/2014  . Chronic LBP 08/15/2014  . Complete rotator cuff rupture of left shoulder 05/01/2014  . Infraspinatus tenosynovitis 05/01/2014  . Other synovitis and tenosynovitis, right shoulder 05/01/2014  . Difficulty in walking 11/30/2013  . Extreme obesity ([place]) 11/30/2013  . Morbid obesity  ([place]) 11/30/2013  . Arthritis, degenerative 10/01/2013  . Steatohepatitis 10/01/2013  . Orthostasis 10/01/2013  . 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 Fall 09/26/2013  . Dizziness 09/07/2013  . Osteoporosis with fracture 09/06/2013  . Clinical depression 03/25/2012  . Adult hypothyroidism 03/25/2012  . Esophageal stenosis 07/03/2009  . Back ache 03/08/2003  . Anxiety state 12/27/2002    Past Surgical History:  Procedure Laterality Date  . APPENDECTOMY    . ESOPHAGOGASTRODUODENOSCOPY (EGD) WITH PROPOFOL N/A 12/20/2014   Procedure: ESOPHAGOGASTRODUODENOSCOPY (EGD) WITH PROPOFOL;  Surgeon: Manya Silvas, MD;  Location: [facility] ENDOSCOPY;  Service: Endoscopy;  Laterality: N/A;  . SAVORY DILATION N/A 12/20/2014   Procedure: SAVORY DILATION;  Surgeon: Manya Silvas, MD;  Location[facility] ENDOSCOPY;  Service: Endoscopy;  Laterality: N/A;  . TONSILLECTOMY      Prior to Admission medications   Medication Sig Start Date End Date Taking? Authorizing Provider  busPIRone (BUSPAR) 15 MG tablet Take 1 tablet (15 mg total) by mouth 2 (two) times daily. 05/26/16  Yes Himabindu Ravi, MD  celecoxib (CELEBREX) 100 MG capsule TAKE ONE CAPSULE BY MOUTH TWICE A DAY 03/07/15  Yes Historical Provider, MD  escitalopram (LEXAPRO) 5 MG tablet Take 1 tablet (5 mg total) by mouth at bedtime. Patient taking differently: Take 10 mg by mouth at bedtime.  05/26/16  Yes Himabindu Ravi, MD  fluticasone (FLONASE) 50 MCG/ACT nasal spray Place 1 spray into both nostrils daily.   Yes Historical Provider, MD  hydrOXYzine (VISTARIL) 25 MG capsule Take 1 capsule (25 mg total) by mouth 3 (three) times daily as needed. 08/13/15  Yes Himabindu Ravi,  MD  KLOR-CON 10 10 MEQ tablet Take 10 mEq by mouth daily. 06/01/16  Yes Historical Provider, MD  levothyroxine (SYNTHROID, LEVOTHROID) 75 MCG tablet Take 75 mcg by mouth daily before breakfast.   Yes Historical Provider, MD  omeprazole (PRILOSEC) 20 MG capsule Take 20 mg by mouth 2 (two)  times daily.   Yes Historical Provider, MD  QUEtiapine (SEROQUEL) 100 MG tablet Take 1 tablet (100 mg total) by mouth at bedtime. Patient taking differently: Take 400 mg by mouth at bedtime.  05/26/16  Yes Himabindu Ravi, MD  traZODone (DESYREL) 100 MG tablet Take 1 tablet (100 mg total) by mouth at bedtime. 05/26/16  Yes Himabindu Ravi, MD  donepezil (ARICEPT) 5 MG tablet Take 5 mg by mouth at bedtime.  08/08/15   Historical Provider, MD    Allergies Sulfa antibiotics  Family History  Problem Relation Age of Onset  . Diabetes Mother   . COPD Mother   . Kidney disease Mother   . Depression Mother   . Heart attack Father   . Aneurysm Father   . Anxiety disorder Sister   . Depression Sister   . Diabetes Sister   . Hypertension Sister   . Atrial fibrillation Brother   . Spinal muscular atrophy Brother   . Breast cancer Sister   . Bone cancer Sister   . Depression Sister   . Anxiety disorder Sister   . Breast cancer Sister   . Colon cancer Sister   . Depression Sister   . Diabetes Sister   . COPD Sister   . Depression Sister   . Anxiety disorder Sister   . Diabetes Brother   . Depression Brother     Social History Social History  Substance Use Topics  . Smoking status: Never Smoker  . Smokeless tobacco: Never Used  . Alcohol use No    Review of Systems Constitutional: No fever/chills Eyes: No visual changes. ENT: No sore throat. Cardiovascular: Denies chest pain. Respiratory: Denies shortness of breath. Gastrointestinal: No abdominal pain.  No nausea, no vomiting.   Genitourinary: Negative for dysuria. Musculoskeletal: Negative for back pain. Skin: Negative for rash. Neurological: Negative for headaches, focal weakness or numbness.  10-point ROS otherwise negative.  ____________________________________________   PHYSICAL EXAM:  VITAL SIGNS: ED Triage Vitals [06/19/16 0952]  Enc Vitals Group     BP (!) 127/46     Pulse Rate 78     Resp 18     Temp 98.4 F  (36.9 C)     Temp Source Oral     SpO2 94 %     Weight 180 lb (81.6 kg)     Height 5' 2"  (1.575 m)     Head Circumference      Peak Flow      Pain Score      Pain Loc      Pain Edu?      Excl. in [place]?     Constitutional: Alert and oriented. Well appearing and in no acute distress.Oriented to year, first of March, and in no distress. Very pleasant. Eyes: Conjunctivae are normal. PERRL. EOMI. Head: Atraumatic. Nose: No congestion/rhinnorhea. Mouth/Throat: Mucous membranes are moist.  Oropharynx non-erythematous. Neck: No stridor.   Cardiovascular: Normal rate, regular rhythm. Grossly normal heart sounds.  Good peripheral circulation. Respiratory: Normal respiratory effort.  No retractions. Lungs CTAB. Gastrointestinal: Soft and nontender. No distention.  Musculoskeletal: No lower extremity tenderness nor edema.   Neurologic:  Normal speech and language. No gross focal neurologic  deficits are appreciated. No gait instability. Skin:  Skin is warm, dry and intact. No rash noted. Psychiatric: Mood and affect are somewhat flat, reports some passive thoughts about suicide recently.  ____________________________________________   LABS (all labs ordered are listed, but only abnormal results are displayed)  Labs Reviewed  COMPREHENSIVE METABOLIC PANEL - Abnormal; Notable for the following:       Result Value   Glucose, Bld 119 (*)    Calcium 8.7 (*)    AST 45 (*)    Total Bilirubin 1.5 (*)    All other components within normal limits  ACETAMINOPHEN LEVEL - Abnormal; Notable for the following:    Acetaminophen (Tylenol), Serum <10 (*)    All other components within normal limits  CBC - Abnormal; Notable for the following:    WBC 2.5 (*)    RDW 17.3 (*)    Platelets 58 (*)    All other components within normal limits  URINE DRUG SCREEN, QUALITATIVE ([facility] ONLY) - Abnormal; Notable for the following:    Tricyclic, Ur Screen POSITIVE (*)    All other components within normal limits    URINALYSIS, COMPLETE (UACMP) WITH MICROSCOPIC - Abnormal; Notable for the following:    Color, Urine AMBER (*)    APPearance HAZY (*)    Hgb urine dipstick SMALL (*)    Nitrite POSITIVE (*)    Bacteria, UA MANY (*)    Squamous Epithelial / LPF 0-5 (*)    All other components within normal limits  URINE CULTURE  ETHANOL  SALICYLATE LEVEL   ____________________________________________  EKG   ____________________________________________  RADIOLOGY   ____________________________________________   PROCEDURES  Procedure(s) performed: None  Procedures  Critical Care performed: No  ____________________________________________   INITIAL IMPRESSION / ASSESSMENT AND PLAN / ED COURSE  Pertinent labs & imaging results that were available during my care of the patient were reviewed by me and considered in my medical decision making (see chart for details).  Patient here for evaluation of depressive symptoms, does carry a long-standing history of it. Lab work is notable for leukopenia, thrombocytopenia, but appears this is chronic. Complains of no infectious symptoms, as well oriented with no evidence of encephalopathic symptoms.  Place the patient under involuntary commitment, given she does report suicidal ideations. Psychiatry consult.  Labs, Reviewed, some bacteria noted and urinalysis but no white blood cells, negative leukocytes. She denies any urinary symptoms. I have added a urine culture.  ----------------------------------------- 2:14 PM on 06/19/2016 -----------------------------------------  Discussed with Dr. Weber Cooks, patient be admitted to psychiatry service.      ____________________________________________   FINAL CLINICAL IMPRESSION(S) / ED DIAGNOSES  Final diagnoses:  Episode of recurrent major depressive disorder, unspecified depression episode severity ([place])      NEW MEDICATIONS STARTED DURING THIS VISIT:  New Prescriptions   No medications  on file     Note:  This document was prepared using Dragon voice recognition software and may include unintentional dictation errors.     Delman Kitten, MD 06/19/16 1415

## 2016-06-19 NOTE — Telephone Encounter (Signed)
Ok, will call

## 2016-06-19 NOTE — Consult Note (Signed)
[place] [facility] Consult   Reason for Consult:  Consult for 76 year old woman referred here to be evaluated for major depression Referring Physician:  Quale Patient Identification: Cassidy [name] MRN:  [id number] Principal Diagnosis: Severe recurrent major depression without psychotic features [facility]) Diagnosis:   Patient Active Problem List   Diagnosis Date Noted  . Severe recurrent major depression without psychotic features ([place]) [F33.2] 06/19/2016  . Hepatic encephalopathy ([place]) [K72.90] 08/13/2015  . Thrombocytopenia ([place]) [D69.6] 07/23/2015  . Amnesia [R41.3] 03/20/2015  . Major depression in remission ([place]) [F32.5] 03/12/2015  . Gonalgia [M25.569] 01/05/2015  . Absolute anemia [D64.9] 11/27/2014  . Arthritis [M19.90] 11/27/2014  . Acid reflux [K21.9] 11/27/2014  . Depression, major, recurrent, in partial remission ([place]) [F33.41] 11/27/2014  . Encounter for general adult medical examination without abnormal findings [Z00.00] 08/25/2014  . Chronic LBP [M54.5, G89.29] 08/15/2014  . 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 Infraspinatus tenosynovitis [M65.819] 05/01/2014  . Other synovitis and tenosynovitis, right shoulder [M65.811] 05/01/2014  . Difficulty in walking [R26.2] 11/30/2013  . Extreme obesity ([place]) [E66.01] 11/30/2013  . Morbid obesity ([place]) [E66.01] 11/30/2013  . Arthritis, degenerative [M19.90] 10/01/2013  . Steatohepatitis [K75.81] 10/01/2013  . Orthostasis [I95.1] 10/01/2013  . Appendicular ataxia [R27.0] 09/26/2013  . Fall [W19.XXXA] 09/26/2013  . Dizziness [R42] 09/07/2013  . Osteoporosis with fracture [M80.00XA] 09/06/2013  . Clinical depression [F32.9] 03/25/2012  . Adult hypothyroidism [E03.9] 03/25/2012  . Esophageal stenosis [K22.2] 07/03/2009  . Back ache [M54.9] 03/08/2003  . Anxiety state [F41.1] 12/27/2002    Total Time spent with patient: 1 hour  Subjective:   Cassidy [name] is a 76 y.o. female patient  admitted with "I just can't eat".  HPI:  Patient interviewed. Chart reviewed. 76 year old woman who is an outpatient in our clinic who saw her outpatient psychiatrist today and then was referred to the emergency room. Patient says that she has been depressed probably for a couple years but it has been getting progressively worse. Her appetite is extremely poor and she can't bring herself to eat. This is been going on for months. She is losing weight. Energy level is very poor. Mood feels sad and depressed all the time. Has suicidal thoughts and wishes she were dead. Says that she thinks she would kill her self if it weren't for her grandson. Denies any hallucinations. Doesn't appear to be psychotic. She apparently has been compliant with outpatient treatment.  Social history: Patient lives with a grandson. Major stress is that she had a daughter who passed away in the fairly recent past and she continues to grieve very hard. 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 Obviously does not work currently.  Medical history: Patient has hypothyroidism some arthritis. Anemia. No history of heart disease or stroke or diabetes  Substance abuse history: Denies any alcohol or drug abuse current or past  Past Psychiatric History: Patient has had prior psychiatric admissions here at our hospital before. Denies suicide attempts. Patient has been on multiple antidepressants without consistent benefit. She is currently on Lexapro and BuSpar and Seroquel.  Risk to Self: Suicidal Ideation: Yes-Currently Present Suicidal Intent: No Is patient at risk for suicide?: No Suicidal Plan?: No-Not Currently/Within Last 6 Months Access to Means: Yes Specify Access to Suicidal Means: Overdose on medications but do not wish to do it. What has been your use of drugs/alcohol within the last 12 months?: Reports of none How many times?: 0 Other Self Harm Risks: Reports  of none Triggers for Past Attempts: Other (Comment)  (Daughter passed approximately a year ago.) Intentional Self Injurious Behavior: None Risk to Others: Homicidal Ideation: No Thoughts of Harm to Others: No Current Homicidal Intent: No Current Homicidal Plan: No Access to Homicidal Means: No Identified Victim: Reports of none History of harm to others?: No Assessment of Violence: None Noted Violent Behavior Description: Reports of none Does patient have access to weapons?: No Criminal Charges Pending?: No Does patient have a court date: No Prior Inpatient Therapy: Prior Inpatient Therapy: Yes Prior Therapy Dates: 2017 Prior Therapy Facilty/Provider(s): [facility] Reason for Treatment: Depression Prior Outpatient Therapy: Prior Outpatient Therapy: No Prior Therapy Dates: Current Prior Therapy Facilty/Provider(s): [facility]  Reason for Treatment: Depression Does patient have an ACCT team?: No Does patient have Intensive In-House Services?  : No Does patient have [facility] services? : No Does patient have P4CC services?: No  Past Medical History:  Past Medical History:  Diagnosis Date  . Anxiety   . Depression   . Fatty liver   . GERD (gastroesophageal reflux disease)   . Hypothyroidism   . Obesity   . Thyroid disease     Past Surgical History:  Procedure Laterality Date  . APPENDECTOMY    . ESOPHAGOGASTRODUODENOSCOPY (EGD) WITH PROPOFOL N/A 12/20/2014   Procedure: ESOPHAGOGASTRODUODENOSCOPY (EGD) WITH PROPOFOL;  Surgeon: Manya Silvas, MD;  Location: [facility] ENDOSCOPY;  Service: Endoscopy;  Laterality: N/A;  . SAVORY DILATION N/A 12/20/2014   Procedure: SAVORY DILATION;  Surgeon: Manya Silvas, MD;  Location: [facility] ENDOSCOPY;  Service: Endoscopy;  Laterality: N/A;  . TONSILLECTOMY     Family History:  Family History  Problem Relation Age of Onset  . Diabetes Mother   . COPD Mother   . Kidney disease Mother   . Depression Mother   . Heart attack Father   . Aneurysm Father   . Anxiety  disorder Sister   . Depression Sister   . Diabetes Sister   . Hypertension Sister   . Atrial fibrillation Brother   . Spinal muscular atrophy Brother   . Breast cancer Sister   . Bone cancer Sister   . Depression Sister   . Anxiety disorder Sister   . Breast cancer Sister   . Colon cancer Sister   . Depression Sister   . Diabetes Sister   . COPD Sister   . Depression Sister   . Anxiety disorder Sister   . Diabetes Brother   . Depression Brother    Family Psychiatric  History: She has a sister who has depression as well. No family history of suicide Social History:  History  Alcohol Use No     History  Drug Use No    Social History   Social History  . Marital status: Widowed    Spouse name: N/A  . Number of children: N/A  . Years of education: N/A   Social History Main Topics  . Smoking status: Never Smoker  . Smokeless tobacco: Never Used  . Alcohol use No  . Drug use: No  . Sexual activity: Not Currently    Birth control/ protection: Post-menopausal   Other Topics Concern  . None   Social History Narrative  . None   Additional Social History:    Allergies:   Allergies  Allergen Reactions  . Sulfa Antibiotics Shortness Of Breath    Labs:  Results for orders placed or performed during the hospital encounter of 06/19/16 (from the past 48 hour(s))  Comprehensive metabolic panel     Status: Abnormal   Collection Time: 06/19/16 10:33 AM  Result Value Ref Range   Sodium 139 135 - 145 mmol/L   Potassium 3.9 3.5 - 5.1 mmol/L   Chloride 106 101 - 111 mmol/L   CO2 27 22 - 32 mmol/L   Glucose, Bld 119 (H) 65 - 99 mg/dL   BUN 13 6 - 20 mg/dL   Creatinine, Ser 0.74 0.44 - 1.00 mg/dL   Calcium 8.7 (L) 8.9 - 10.3 mg/dL   Total Protein 6.9 6.5 - 8.1 g/dL   Albumin 3.5 3.5 - 5.0 g/dL   AST 45 (H) 15 - 41 U/L   ALT 26 14 - 54 U/L   Alkaline Phosphatase 91 38 - 126 U/L   Total Bilirubin 1.5 (H) 0.3 - 1.2 mg/dL   GFR calc non Af Amer >60 >60 mL/min   GFR  calc Af Amer >60 >60 mL/min    Comment: (NOTE) The eGFR has been calculated using the CKD EPI equation. This calculation has not been validated in all clinical situations. eGFR's persistently <60 mL/min signify possible Chronic Kidney Disease.    Anion gap 6 5 - 15  Ethanol     Status: None   Collection Time: 06/19/16 10:33 AM  Result Value Ref Range   Alcohol, Ethyl (B) <5 <5 mg/dL    Comment:        LOWEST DETECTABLE LIMIT FOR SERUM ALCOHOL IS 5 mg/dL FOR MEDICAL PURPOSES ONLY   Salicylate level     Status: None   Collection Time: 06/19/16 10:33 AM  Result Value Ref Range   Salicylate Lvl <5.3 2.8 - 30.0 mg/dL  Acetaminophen level     Status: Abnormal   Collection Time: 06/19/16 10:33 AM  Result Value Ref Range   Acetaminophen (Tylenol), Serum <10 (L) 10 - 30 ug/mL    Comment:        THERAPEUTIC CONCENTRATIONS VARY SIGNIFICANTLY. A RANGE OF 10-30 ug/mL MAY BE AN EFFECTIVE CONCENTRATION FOR MANY PATIENTS. HOWEVER, SOME ARE BEST TREATED AT CONCENTRATIONS OUTSIDE THIS RANGE. ACETAMINOPHEN CONCENTRATIONS >150 ug/mL AT 4 HOURS AFTER INGESTION AND >50 ug/mL AT 12 HOURS AFTER INGESTION ARE OFTEN ASSOCIATED WITH TOXIC REACTIONS.   cbc     Status: Abnormal   Collection Time: 06/19/16 10:33 AM  Result Value Ref Range   WBC 2.5 (L) 3.6 - 11.0 K/uL   RBC 4.40 3.80 - 5.20 MIL/uL   Hemoglobin 13.5 12.0 - 16.0 g/dL   HCT 38.7 35.0 - 47.0 %   MCV 88.0 80.0 - 100.0 fL   MCH 30.6 26.0 - 34.0 pg   MCHC 34.8 32.0 - 36.0 g/dL   RDW 17.3 (H) 11.5 - 14.5 %   Platelets 58 (L) 150 - 440 K/uL  Urine Drug Screen, Qualitative     Status: Abnormal   Collection Time: 06/19/16 10:33 AM  Result Value Ref Range   Tricyclic, Ur Screen POSITIVE (A) NONE DETECTED   Amphetamines, Ur Screen NONE DETECTED NONE DETECTED   MDMA (Ecstasy)Ur Screen NONE DETECTED NONE DETECTED   Cocaine Metabolite,Ur [place] NONE DETECTED NONE DETECTED   Opiate, Ur Screen NONE DETECTED NONE DETECTED   Phencyclidine  (PCP) Ur S NONE DETECTED NONE DETECTED   Cannabinoid 50 Ng, Ur  NONE DETECTED NONE DETECTED   Barbiturates, Ur Screen NONE DETECTED NONE DETECTED   Benzodiazepine, Ur Scrn NONE DETECTED NONE DETECTED   Methadone Scn, Ur NONE DETECTED NONE DETECTED    Comment: (NOTE) 100  Tricyclics, urine               Cutoff 1000 ng/mL 200  Amphetamines, urine             Cutoff 1000 ng/mL 300  MDMA (Ecstasy), urine           Cutoff 500 ng/mL 400  Cocaine Metabolite, urine       Cutoff 300 ng/mL 500  Opiate, urine                   Cutoff 300 ng/mL 600  Phencyclidine (PCP), urine      Cutoff 25 ng/mL 700  Cannabinoid, urine              Cutoff 50 ng/mL 800  Barbiturates, urine             Cutoff 200 ng/mL 900  Benzodiazepine, urine           Cutoff 200 ng/mL 1000 Methadone, urine                Cutoff 300 ng/mL 1100 1200 The urine drug screen provides only a preliminary, unconfirmed 1300 analytical test result and should not be used for non-medical 1400 purposes. Clinical consideration and professional judgment should 1500 be applied to any positive drug screen result due to possible 1600 interfering substances. A more specific alternate chemical method 1700 must be used in order to obtain a confirmed analytical result.  1800 Gas chromato graphy / mass spectrometry (GC/MS) is the preferred 1900 confirmatory method.   Urinalysis, Complete w Microscopic     Status: Abnormal   Collection Time: 06/19/16 10:33 AM  Result Value Ref Range   Color, Urine AMBER (A) YELLOW    Comment: BIOCHEMICALS MAY BE AFFECTED BY COLOR   APPearance HAZY (A) CLEAR   Specific Gravity, Urine 1.018 1.005 - 1.030   pH 5.0 5.0 - 8.0   Glucose, UA NEGATIVE NEGATIVE mg/dL   Hgb urine dipstick SMALL (A) NEGATIVE   Bilirubin Urine NEGATIVE NEGATIVE   Ketones, ur NEGATIVE NEGATIVE mg/dL   Protein, ur NEGATIVE NEGATIVE mg/dL   Nitrite POSITIVE (A) NEGATIVE   Leukocytes, UA NEGATIVE NEGATIVE   RBC / HPF 0-5 0 - 5 RBC/hpf    WBC, UA 0-5 0 - 5 WBC/hpf   Bacteria, UA MANY (A) NONE SEEN   Squamous Epithelial / LPF 0-5 (A) NONE SEEN   Mucous PRESENT     Current Facility-Administered Medications  Medication Dose Route Frequency Provider Last Rate Last Dose  . busPIRone (BUSPAR) tablet 15 mg  15 mg Oral BID Gonzella Lex, MD      . celecoxib (CELEBREX) capsule 100 mg  100 mg Oral Daily Dnasia Gauna T Danyelle Brookover, MD      . donepezil (ARICEPT) tablet 5 mg  5 mg Oral QHS Davey Bergsma T Macintyre Alexa, MD      . escitalopram (LEXAPRO) tablet 10 mg  10 mg Oral QHS Gonzella Lex, MD      . fluticasone (FLONASE) 50 MCG/ACT nasal spray 2 spray  2 spray Each Nare Daily Gonzella Lex, MD      . Derrill Memo ON 06/20/2016] levothyroxine (SYNTHROID, LEVOTHROID) tablet 75 mcg  75 mcg Oral QAC breakfast Gonzella Lex, MD      . pantoprazole (PROTONIX) EC tablet 40 mg  40 mg Oral Daily Kingslee Dowse T Calixto Pavel, MD      . potassium chloride SA (K-DUR,KLOR-CON) CR tablet 10 mEq  10 mEq Oral Once [employer],  MD      . QUEtiapine (SEROQUEL) tablet 100 mg  100 mg Oral QHS Gonzella Lex, MD       Current Outpatient Prescriptions  Medication Sig Dispense Refill  . busPIRone (BUSPAR) 15 MG tablet Take 1 tablet (15 mg total) by mouth 2 (two) times daily. 180 tablet 0  . celecoxib (CELEBREX) 100 MG capsule TAKE ONE CAPSULE BY MOUTH TWICE A DAY    . escitalopram (LEXAPRO) 5 MG tablet Take 1 tablet (5 mg total) by mouth at bedtime. (Patient taking differently: Take 10 mg by mouth at bedtime. ) 30 tablet 3  . fluticasone (FLONASE) 50 MCG/ACT nasal spray Place 1 spray into both nostrils daily.    . hydrOXYzine (VISTARIL) 25 MG capsule Take 1 capsule (25 mg total) by mouth 3 (three) times daily as needed. 30 capsule 0  . KLOR-CON 10 10 MEQ tablet Take 10 mEq by mouth daily.    Marland Kitchen levothyroxine (SYNTHROID, LEVOTHROID) 75 MCG tablet Take 75 mcg by mouth daily before breakfast.    . omeprazole (PRILOSEC) 20 MG capsule Take 20 mg by mouth 2 (two) times daily.    . QUEtiapine  (SEROQUEL) 100 MG tablet Take 1 tablet (100 mg total) by mouth at bedtime. (Patient taking differently: Take 400 mg by mouth at bedtime. ) 30 tablet 3  . traZODone (DESYREL) 100 MG tablet Take 1 tablet (100 mg total) by mouth at bedtime. 30 tablet 3  . donepezil (ARICEPT) 5 MG tablet Take 5 mg by mouth at bedtime.       Musculoskeletal: Strength & Muscle Tone: within normal limits Gait & Station: normal Patient leans: N/A  Psychiatric Specialty Exam: Physical Exam  Nursing note and vitals reviewed. Constitutional: She appears well-developed.  HENT:  Head: Normocephalic and atraumatic.  Eyes: Conjunctivae are normal. Pupils are equal, round, and reactive to light.  Neck: Normal range of motion.  Cardiovascular: Regular rhythm and normal heart sounds.   Respiratory: Effort normal. No respiratory distress.  GI: Soft.  Musculoskeletal: Normal range of motion.  Neurological: She is alert.  Skin: Skin is warm and dry.  Psychiatric: Judgment normal. Her affect is blunt. Her speech is delayed. She is slowed. Cognition and memory are normal. She exhibits a depressed mood. She expresses suicidal ideation.    Review of Systems  Constitutional: Positive for malaise/fatigue.  HENT: Negative.   Eyes: Negative.   Respiratory: Negative.   Cardiovascular: Negative.   Gastrointestinal: Negative.   Musculoskeletal: Negative.   Skin: Negative.   Neurological: Positive for weakness.  Psychiatric/Behavioral: Positive for depression and suicidal ideas. Negative for hallucinations, memory loss and substance abuse. The patient is nervous/anxious. The patient does not have insomnia.     Blood pressure (!) 127/46, pulse 78, temperature 98.4 F (36.9 C), temperature source Oral, resp. rate 18, height 5' 2"  (1.575 m), weight 81.6 kg (180 lb), SpO2 94 %.Body mass index is 32.92 kg/m.  General Appearance: Casual  Eye Contact:  Fair  Speech:  Slow  Volume:  Decreased  Mood:  Depressed  Affect:  Blunt   Thought Process:  Goal Directed  Orientation:  Full (Time, Place, and Person)  Thought Content:  Rumination  Suicidal Thoughts:  Yes.  without intent/plan  Homicidal Thoughts:  No  Memory:  Immediate;   Good Recent;   Fair Remote;   Fair  Judgement:  Fair  Insight:  Fair  Psychomotor Activity:  Decreased  Concentration:  Concentration: Fair  Recall:  [employer] of Knowledge:  Fair  Language:  Fair  Akathisia:  No  Handed:  Right  AIMS (if indicated):     Assets:  Desire for Improvement Financial Resources/Insurance Housing Physical Health Resilience Social Support  ADL's:  Intact  Cognition:  WNL  Sleep:        Treatment Plan Summary: Daily contact with patient to assess and evaluate symptoms and progress in treatment, Medication management and Plan 76 year old woman with severe recurrent major depression. She is not eating and drinking. Putting her health at risk. Has suicidal thoughts with an actual wish to die. She is referred for admission by her outpatient doctor. Patient is agreeable to admission to the inpatient psychiatric ward. Continue current medicine for now. I discussed ECT with the patient. I think she would be an excellent candidate for ECT with her history and the patient expressed tentative agreement. I will pass this on to the treatment team downstairs. 15 minute checks in place. Orders completed.  Disposition: Recommend psychiatric Inpatient admission when medically cleared. Supportive therapy provided about ongoing stressors.  Alethia Berthold, MD 06/19/2016 2:58 PM

## 2016-06-19 NOTE — BH Assessment (Addendum)
Writer called to updated patient's sister ([name]) on the status of the patient possibly being admitted to [facility] on tomorrow (06/20/2016). Also reiterated the admission was pending her ability to do certain things independently.  Writer advised her to call in the morning, after 8am, and check with her nurse for further any other updates.

## 2016-06-19 NOTE — ED Notes (Signed)
Pt assisted to bathroom to urinate at this time.

## 2016-06-19 NOTE — Telephone Encounter (Signed)
dr. Ouida Sills would like to speak with you about patient.  please call him at  [phone]

## 2016-06-19 NOTE — ED Notes (Signed)
Ambulated to BR independently.  Gait steady.  Tolerated well.

## 2016-06-19 NOTE — BH Assessment (Signed)
Writer called and left a HIPPA Compliant message with sister [name] Harrison-[phone]), requesting a return phone call.

## 2016-06-19 NOTE — ED Triage Notes (Signed)
Pt in via POV; pt sent over via Dr. Marney Doctor to be admitted to a psychiatric facility.  Pt's sister reports extreme depression and expressions of SI periodically since April of 2017 when pt lost her youngest daughter.  Pt reports SI at this time, denies HI.

## 2016-06-19 NOTE — Progress Notes (Signed)
Patient ID: Cassidy [name], female   DOB: [birth year], 76 y.o.   MRN: [id number]   [facility] MD/PA/NP OP Progress Note  06/19/2016 9:00 AM GLADY OUDERKIRK  MRN:  [id number]  Subjective:  Patient returns for follow-up of her major depressive disorder, generalized anxiety disorder, panic disorder and insomnia. She was seen with her sister today. Sister had called yesterday saying that patient had not been doing well. She reported that patient has been decompensating and not eating or drinking. States that patient is has been crying every day. She was told to take the patient to the ER and have admitted that the sister was reluctant to do that. Patient is being seen today as an emergent appointment. Patient herself reports that she has no will to live and feels like there is no hope in life. She is missing her daughter who died last year. She has not been eating at all. States that she drinks some chocolate milk when she takes the medication. She has been losing weight. She reports crying everyday. We discussed hospitalization and the patient is agreeable.  Chief Complaint: Not doing well decompensating   Visit Diagnosis:     ICD-9-CM ICD-10-CM   1. Major depressive disorder, recurrent episode, moderate (HCC) 296.32 F33.1   2. GAD (generalized anxiety disorder) 300.02 F41.1     Past Medical History:  Past Medical History:  Diagnosis Date  . Anxiety   . Depression   . Fatty liver   . GERD (gastroesophageal reflux disease)   . Hypothyroidism   . Obesity   . Thyroid disease      Past Surgical History:  Procedure Laterality Date  . APPENDECTOMY    . ESOPHAGOGASTRODUODENOSCOPY (EGD) WITH PROPOFOL N/A 12/20/2014   Procedure: ESOPHAGOGASTRODUODENOSCOPY (EGD) WITH PROPOFOL;  Surgeon: Manya Silvas, MD;  Location: [facility] ENDOSCOPY;  Service: Endoscopy;  Laterality: N/A;  . SAVORY DILATION N/A 12/20/2014   Procedure: SAVORY DILATION;  Surgeon: Manya Silvas, MD;  Location: [facility] ENDOSCOPY;  Service:  Endoscopy;  Laterality: N/A;  . TONSILLECTOMY     Family History:  Family History  Problem Relation Age of Onset  . Diabetes Mother   . COPD Mother   . Kidney disease Mother   . Depression Mother   . Heart attack Father   . Aneurysm Father   . Anxiety disorder Sister   . Depression Sister   . Diabetes Sister   . Hypertension Sister   . Atrial fibrillation Brother   . Spinal muscular atrophy Brother   . Breast cancer Sister   . Bone cancer Sister   . Depression Sister   . Anxiety disorder Sister   . Breast cancer Sister   . Colon cancer Sister   . Depression Sister   . Diabetes Sister   . COPD Sister   . Depression Sister   . Anxiety disorder Sister   . Diabetes Brother   . Depression Brother    Social History:  Social History   Social History  . Marital status: Widowed    Spouse name: N/A  . Number of children: N/A  . Years of education: N/A   Social History Main Topics  . Smoking status: Never Smoker  . Smokeless tobacco: Never Used  . Alcohol use No  . Drug use: No  . Sexual activity: Not Currently    Birth control/ protection: Post-menopausal   Other Topics Concern  . Not on file   Social History Narrative  . No narrative on file  Additional History:   Assessment:   Musculoskeletal: Strength & Muscle Tone: decreased Gait & Station: In a wheelchair today  Patient leans: N/A  Psychiatric Specialty Exam: Medication Refill     Review of Systems  Psychiatric/Behavioral: Positive for memory loss. Negative for depression, hallucinations, substance abuse and suicidal ideas. The patient is not nervous/anxious and does not have insomnia.   All other systems reviewed and are negative.   There were no vitals taken for this visit.There is no height or weight on file to calculate BMI.  General Appearance: Well Groomed  Eye Contact:  Good  Speech:  Minimal   Volume:  Normal  Mood:  Depressed   Affect:  Pleasant   Thought Process:  Linear   Orientation:  Full (Time, Place, and Person)  Thought Content:  Negative  Suicidal Thoughts:  No  Homicidal Thoughts:  No  Memory:  Immediate;   Good Recent;   Good Remote;   Good  Judgement:  Limited   Insight:  Limited   Psychomotor Activity:  Negative  Concentration:  Good  Recall:  Good  Fund of Knowledge: Good  Language: Good  Akathisia:  Negative  Handed:  Right unknown   AIMS (if indicated):   Done today, normal, no stiffness noted  Assets:  Communication Skills Desire for Improvement Social Support  ADL's:  Intact  Cognition: WNL  Sleep:  good   Is the patient at risk to self?  No. Has the patient been a risk to self in the past 6 months?  No. Has the patient been a risk to self within the distant past?  No. Is the patient a risk to others?  No. Has the patient been a risk to others in the past 6 months?  No. Has the patient been a risk to others within the distant past?  No.  Current Medications: Current Outpatient Prescriptions  Medication Sig Dispense Refill  . busPIRone (BUSPAR) 15 MG tablet Take 1 tablet (15 mg total) by mouth 2 (two) times daily. 180 tablet 0  . celecoxib (CELEBREX) 100 MG capsule TAKE ONE CAPSULE BY MOUTH TWICE A DAY    . donepezil (ARICEPT) 5 MG tablet     . escitalopram (LEXAPRO) 5 MG tablet Take 1 tablet (5 mg total) by mouth at bedtime. 30 tablet 3  . fluticasone (FLONASE) 50 MCG/ACT nasal spray Place 1 spray into both nostrils daily.    . hydrOXYzine (VISTARIL) 25 MG capsule Take 1 capsule (25 mg total) by mouth 3 (three) times daily as needed. 30 capsule 0  . KLOR-CON 10 10 MEQ tablet     . Lactulose 20 GM/30ML SOLN Take by mouth.    . levothyroxine (SYNTHROID, LEVOTHROID) 75 MCG tablet Take 75 mcg by mouth daily before breakfast.    . omeprazole (PRILOSEC) 20 MG capsule Take 20 mg by mouth 2 (two) times daily.    . QUEtiapine (SEROQUEL) 100 MG tablet Take 1 tablet (100 mg total) by mouth at bedtime. 30 tablet 3  . traZODone  (DESYREL) 100 MG tablet Take 1 tablet (100 mg total) by mouth at bedtime. 30 tablet 3   No current facility-administered medications for this visit.     Medical Decision Making:  Review of Medication Regimen & Side Effects (2)  Treatment Plan Summary:Medication management and Plan   Major depressive disorder- Patient has been decompensating steadily over the past few months and she is at a point where she has not been eating and losing weight. Patient has been  endorsing passive suicidal thoughts and states that she does not care if she is alive. Patient has been  feeling hopeless and helpless and will need hospitalization for further stabilization. Patient is being sent to the emergency room here at the hospital and further assessment done and she will be admitted to the geropsychiatric unit for availability Patient's case was discussed with the Dr. Jerilee Hoh and TTS. Since patient is a above the age of 9 she will not be admitted to the behavioral health unit here.  Current medications include Seroquel at 137m once daily  Patient may benefit from increase in Lexapro to 10 mg and further titration once she is hospitalized BuSpar 15 mg twice daily.  Trazodone to 100 mg at bedtime   RTC when she is discharged from her inpatient hospitalization.    Kierstin January 06/19/2016, 9:00 AM

## 2016-06-20 ENCOUNTER — Emergency Department: Payer: Medicare Other

## 2016-06-20 ENCOUNTER — Inpatient Hospital Stay
Admission: EM | Admit: 2016-06-20 | Discharge: 2016-07-04 | DRG: 885 | Disposition: A | Discharge status: Home / Self Care | Payer: Medicare Other | Source: Intra-hospital | Attending: Psychiatry | Admitting: Psychiatry

## 2016-06-20 ENCOUNTER — Other Ambulatory Visit: Payer: Self-pay | Admitting: Psychiatry

## 2016-06-20 DIAGNOSIS — F329 Major depressive disorder, single episode, unspecified: Secondary | ICD-10-CM | POA: Diagnosis present

## 2016-06-20 DIAGNOSIS — R634 Abnormal weight loss: Secondary | ICD-10-CM | POA: Diagnosis present

## 2016-06-20 DIAGNOSIS — R0902 Hypoxemia: Secondary | ICD-10-CM

## 2016-06-20 DIAGNOSIS — K219 Gastro-esophageal reflux disease without esophagitis: Secondary | ICD-10-CM | POA: Diagnosis present

## 2016-06-20 DIAGNOSIS — Z818 Family history of other mental and behavioral disorders: Secondary | ICD-10-CM

## 2016-06-20 DIAGNOSIS — F332 Major depressive disorder, recurrent severe without psychotic features: Secondary | ICD-10-CM | POA: Diagnosis present

## 2016-06-20 DIAGNOSIS — Z808 Family history of malignant neoplasm of other organs or systems: Secondary | ICD-10-CM

## 2016-06-20 DIAGNOSIS — Z800 Family history of malignant neoplasm of digestive organs: Secondary | ICD-10-CM

## 2016-06-20 DIAGNOSIS — E039 Hypothyroidism, unspecified: Secondary | ICD-10-CM | POA: Diagnosis present

## 2016-06-20 DIAGNOSIS — E669 Obesity, unspecified: Secondary | ICD-10-CM | POA: Diagnosis present

## 2016-06-20 DIAGNOSIS — Z79899 Other long term (current) drug therapy: Secondary | ICD-10-CM | POA: Diagnosis not present

## 2016-06-20 DIAGNOSIS — R627 Adult failure to thrive: Secondary | ICD-10-CM | POA: Diagnosis present

## 2016-06-20 DIAGNOSIS — Z6832 Body mass index (BMI) 32.0-32.9, adult: Secondary | ICD-10-CM | POA: Diagnosis not present

## 2016-06-20 DIAGNOSIS — Z803 Family history of malignant neoplasm of breast: Secondary | ICD-10-CM | POA: Diagnosis not present

## 2016-06-20 DIAGNOSIS — F339 Major depressive disorder, recurrent, unspecified: Secondary | ICD-10-CM | POA: Diagnosis not present

## 2016-06-20 MED ORDER — FLUTICASONE PROPIONATE 50 MCG/ACT NA SUSP
2.0000 | Freq: Every day | NASAL | Status: DC
  Administered 2016-06-21 – 2016-07-03 (×9): 2 via NASAL
  Filled 2016-06-20: qty 16

## 2016-06-20 MED ORDER — DONEPEZIL HCL 5 MG PO TABS
5.0000 mg | ORAL_TABLET | Freq: Every day | ORAL | Status: DC
  Administered 2016-06-21 – 2016-07-03 (×13): 5 mg via ORAL
  Filled 2016-06-20 (×14): qty 1

## 2016-06-20 MED ORDER — PANTOPRAZOLE SODIUM 40 MG PO TBEC
40.0000 mg | DELAYED_RELEASE_TABLET | Freq: Every day | ORAL | Status: DC
  Administered 2016-06-21 – 2016-07-04 (×13): 40 mg via ORAL
  Filled 2016-06-20 (×14): qty 1

## 2016-06-20 MED ORDER — CELECOXIB 100 MG PO CAPS
100.0000 mg | ORAL_CAPSULE | Freq: Every day | ORAL | Status: DC
  Administered 2016-06-21 – 2016-07-04 (×14): 100 mg via ORAL
  Filled 2016-06-20 (×14): qty 1

## 2016-06-20 MED ORDER — MAGNESIUM HYDROXIDE 400 MG/5ML PO SUSP: 30.0000 mL | Freq: Every day | ORAL | Status: DC | PRN

## 2016-06-20 MED ORDER — BUSPIRONE HCL 5 MG PO TABS
15.0000 mg | ORAL_TABLET | Freq: Two times a day (BID) | ORAL | Status: DC
  Administered 2016-06-21 – 2016-07-04 (×26): 15 mg via ORAL
  Filled 2016-06-20 (×28): qty 3

## 2016-06-20 MED ORDER — ACETAMINOPHEN 325 MG PO TABS: 650.0000 mg | ORAL_TABLET | Freq: Four times a day (QID) | ORAL | Status: DC | PRN

## 2016-06-20 MED ORDER — QUETIAPINE FUMARATE 100 MG PO TABS
100.0000 mg | ORAL_TABLET | Freq: Every day | ORAL | Status: DC
  Administered 2016-06-21 – 2016-07-03 (×14): 100 mg via ORAL
  Filled 2016-06-20 (×14): qty 1

## 2016-06-20 MED ORDER — LEVOTHYROXINE SODIUM 50 MCG PO TABS
75.0000 ug | ORAL_TABLET | Freq: Every day | ORAL | Status: DC
  Administered 2016-06-21 – 2016-07-04 (×13): 75 ug via ORAL
  Filled 2016-06-20 (×13): qty 1

## 2016-06-20 MED ORDER — QUETIAPINE FUMARATE 100 MG PO TABS: 100.0000 mg | ORAL_TABLET | Freq: Every day | ORAL | Status: DC

## 2016-06-20 MED ORDER — ALUM & MAG HYDROXIDE-SIMETH 200-200-20 MG/5ML PO SUSP: 30.0000 mL | ORAL | Status: DC | PRN

## 2016-06-20 MED ORDER — ESCITALOPRAM OXALATE 10 MG PO TABS
10.0000 mg | ORAL_TABLET | Freq: Every day | ORAL | Status: DC
  Administered 2016-06-21 – 2016-07-03 (×13): 10 mg via ORAL
  Filled 2016-06-20 (×13): qty 1

## 2016-06-20 NOTE — ED Notes (Signed)
BEHAVIORAL HEALTH ROUNDING Patient sleeping: No. Patient alert and oriented: yes Behavior appropriate: Yes.  ; If no, describe:  Nutrition and fluids offered: yes Toileting and hygiene offered: Yes  Sitter present: q15 minute observations and security  monitoring Law enforcement present: Yes  ODS

## 2016-06-20 NOTE — ED Notes (Signed)
Counselor has conducted a brief assessment to determine appropriateness for BMU admission. Pt states that she currently lives at home with her grandson who supports and assist her when he's available. Patient states that when she's alone she "kind of makes do." Patient reports that she walks with the assistive devices (four-prong cane) and ambulates fairly well while at home. While here in the ER patient has needed minimal assistance as the ER technicians have provided patient with guided assistance throughout the day. Patient appears to need minimal assistance when transitioning from bed to restroom and vice versa. Patient is capable of toileting and feeding herself although she requires a shower chair and assistance with bathing. Patient reports no recent falls. Last reprted fall occuring approximately 2 years ago. Counsleor will consult with BMU charge nurse to determine safely plan appropriate disposition for the patient

## 2016-06-20 NOTE — ED Notes (Signed)
Am meds administered as ordered  Assessment completed  Plan of care discussed - awaiting inpt bed assignment

## 2016-06-20 NOTE — ED Notes (Signed)
Graham crackers and peanut butter given to patient with sprite

## 2016-06-20 NOTE — ED Notes (Addendum)
coffee provided per pt request  - decaf and cooled with milk  Awake alert drinking well  - talking on the phone with her sister     [place] Patient sleeping: No. Patient alert and oriented: yes Behavior appropriate: Yes.  ; If no, describe:  Nutrition and fluids offered: yes Toileting and hygiene offered: Yes  Sitter present: q15 minute observations and security  monitoring Law enforcement present: Yes  ODS  ENVIRONMENTAL ASSESSMENT Potentially harmful objects out of patient reach: Yes.   Personal belongings secured: Yes.   Patient dressed in hospital provided attire only: Yes.   Plastic bags out of patient reach: Yes.   Patient care equipment (cords, cables, call bells, lines, and drains) shortened, removed, or accounted for: Yes.   Equipment and supplies removed from bottom of stretcher: Yes.   Potentially toxic materials out of patient reach: Yes.   Sharps container removed or out of patient reach: Yes.

## 2016-06-20 NOTE — ED Notes (Signed)
ED BHU [place] Is the patient under IVC or is there intent for IVC: Yes.   Is the patient medically cleared: Yes.   Is there vacancy in the ED BHU: Yes.   Is the population mix appropriate for patient:   Is the patient awaiting placement in inpatient or outpatient setting: Yes.   inpt BMU admission  Bed assignment pending  Has the patient had a psychiatric consult: Yes.   Survey of unit performed for contraband, proper placement and condition of furniture, tampering with fixtures in bathroom, shower, and each patient room: Yes.  ; Findings:  APPEARANCE/BEHAVIOR Calm and cooperative NEURO ASSESSMENT Orientation: oriented x3  Denies pain Hallucinations: No.None noted (Hallucinations) Speech: Normal Gait: normal - weak  Stand by assistance provided  RESPIRATORY ASSESSMENT Even  Unlabored respirations  CARDIOVASCULAR ASSESSMENT Pulses equal   regular rate  Skin warm and dry   GASTROINTESTINAL ASSESSMENT no GI complaint EXTREMITIES Full ROM  PLAN OF CARE Provide calm/safe environment. Vital signs assessed twice daily. ED BHU Assessment once each 12-hour shift. Collaborate with TTS daily or as condition indicates. Assure the ED provider has rounded once each shift. Provide and encourage hygiene. Provide redirection as needed. Assess for escalating behavior; address immediately and inform ED provider.  Assess family dynamic and appropriateness for visitation as needed: Yes.  ; If necessary, describe findings:  Educate the patient/family about BHU procedures/visitation: Yes.  ; If necessary, describe findings:

## 2016-06-20 NOTE — ED Notes (Signed)
Elmyra Ricks Education officer, museum in room with patient at this time

## 2016-06-20 NOTE — ED Notes (Signed)
Patient on phone talking to her sister Cassidy [name]

## 2016-06-20 NOTE — ED Notes (Signed)
Pt with a supervised visit from her two sisters - [place] has been at bedside  Pt to be admitted to [facility] BMU

## 2016-06-20 NOTE — ED Notes (Signed)
Pt up to use restroom with assistance from this tech and walker

## 2016-06-20 NOTE — Consult Note (Signed)
[name]   Reason for Consult:  Consult for 76 year old woman referred here to be evaluated for major depression Referring Physician:  Quale Patient Identification: Cassidy [name] MRN:  [id number] Principal Diagnosis: Severe recurrent major depression without psychotic features [facility]) Diagnosis:   Patient Active Problem List   Diagnosis Date Noted  . Severe recurrent major depression without psychotic features ([place]) [F33.2] 06/19/2016  . Hepatic encephalopathy ([place]) [K72.90] 08/13/2015  . Thrombocytopenia ([place]) [D69.6] 07/23/2015  . Amnesia [R41.3] 03/20/2015  . Major depression in remission ([place]) [F32.5] 03/12/2015  . Gonalgia [M25.569] 01/05/2015  . Absolute anemia [D64.9] 11/27/2014  . Arthritis [M19.90] 11/27/2014  . Acid reflux [K21.9] 11/27/2014  . Depression, major, recurrent, in partial remission ([place]) [F33.41] 11/27/2014  . Encounter for general adult medical examination without abnormal findings [Z00.00] 08/25/2014  . Chronic LBP [M54.5, G89.29] 08/15/2014  . 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 Infraspinatus tenosynovitis [M65.819] 05/01/2014  . Other synovitis and tenosynovitis, right shoulder [M65.811] 05/01/2014  . Difficulty in walking [R26.2] 11/30/2013  . Extreme obesity ([place]) [E66.01] 11/30/2013  . Morbid obesity ([place]) [E66.01] 11/30/2013  . Arthritis, degenerative [M19.90] 10/01/2013  . Steatohepatitis [K75.81] 10/01/2013  . Orthostasis [I95.1] 10/01/2013  . Appendicular ataxia [R27.0] 09/26/2013  . Fall [W19.XXXA] 09/26/2013  . Dizziness [R42] 09/07/2013  . Osteoporosis with fracture [M80.00XA] 09/06/2013  . Clinical depression [F32.9] 03/25/2012  . Adult hypothyroidism [E03.9] 03/25/2012  . Esophageal stenosis [K22.2] 07/03/2009  . Back ache [M54.9] 03/08/2003  . Anxiety state [F41.1] 12/27/2002    Total Time spent with patient: 30 minutes  Subjective:   [name] is a 76 y.o. female  patient admitted with "I just can't eat".  Follow-up for 76 year old woman with major depression in the emergency room. Patient was seen yesterday and a decision was made to admit her to the hospital. No beds were available until today. Patient has been reviewed by nursing and there is a plan and intention now to admit her to the inpatient unit. Patient seen this afternoon I spoke with her family as well. Patient continues to be depressed and very anxious. She is getting a little bit panicky and was demanding to leave the hospital but was not able to really rationally think through her plans at home. She has eaten a little bit here in the hospital but not very much. Affect is still very down and sad and withdrawn.  HPI:  Patient interviewed. Chart reviewed. 76 year old woman who is an outpatient in our clinic who saw her outpatient psychiatrist today and then was referred to the emergency room. Patient says that she has been depressed probably for a couple years but it has been getting progressively worse. Her appetite is extremely poor and she can't bring herself to eat. This is been going on for months. She is losing weight. Energy level is very poor. Mood feels sad and depressed all the time. Has suicidal thoughts and wishes she were dead. Says that she thinks she would kill her self if it weren't for her grandson. Denies any hallucinations. Doesn't appear to be psychotic. She apparently has been compliant with outpatient treatment.  Social history: Patient lives with a grandson. Major stress is that she had a daughter who passed away in the fairly recent past and she continues to grieve very hard. 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 Obviously does not work currently.  Medical history: Patient has hypothyroidism some arthritis. Anemia. No history of  heart disease or stroke or diabetes  Substance abuse history: Denies any alcohol or drug abuse current or past  Past Psychiatric History:  Patient has had prior psychiatric admissions here at our hospital before. Denies suicide attempts. Patient has been on multiple antidepressants without consistent benefit. She is currently on Lexapro and BuSpar and Seroquel.  Risk to Self: Suicidal Ideation: Yes-Currently Present Suicidal Intent: No Is patient at risk for suicide?: No Suicidal Plan?: No-Not Currently/Within Last 6 Months Access to Means: Yes Specify Access to Suicidal Means: Overdose on medications but do not wish to do it. What has been your use of drugs/alcohol within the last 12 months?: Reports of none How many times?: 0 Other Self Harm Risks: Reports of none Triggers for Past Attempts: Other (Comment) (Daughter passed approximately a year ago.) Intentional Self Injurious Behavior: None Risk to Others: Homicidal Ideation: No Thoughts of Harm to Others: No Current Homicidal Intent: No Current Homicidal Plan: No Access to Homicidal Means: No Identified Victim: Reports of none History of harm to others?: No Assessment of Violence: None Noted Violent Behavior Description: Reports of none Does patient have access to weapons?: No Criminal Charges Pending?: No Does patient have a court date: No Prior Inpatient Therapy: Prior Inpatient Therapy: Yes Prior Therapy Dates: 2017 Prior Therapy Facilty/Provider(s): [facility] Reason for Treatment: Depression Prior Outpatient Therapy: Prior Outpatient Therapy: No Prior Therapy Dates: Current Prior Therapy Facilty/Provider(s): [facility]  Reason for Treatment: Depression Does patient have an ACCT team?: No Does patient have Intensive In-House Services?  : No Does patient have [facility] services? : No Does patient have P4CC services?: No  Past Medical History:  Past Medical History:  Diagnosis Date  . Anxiety   . Depression   . Fatty liver   . GERD (gastroesophageal reflux disease)   . Hypothyroidism   . Obesity   . Thyroid disease      Past Surgical History:  Procedure Laterality Date  . APPENDECTOMY    . ESOPHAGOGASTRODUODENOSCOPY (EGD) WITH PROPOFOL N/A 12/20/2014   Procedure: ESOPHAGOGASTRODUODENOSCOPY (EGD) WITH PROPOFOL;  Surgeon: Manya Silvas, MD;  Location[facility] ENDOSCOPY;  Service: Endoscopy;  Laterality: N/A;  . SAVORY DILATION N/A 12/20/2014   Procedure: SAVORY DILATION;  Surgeon: Manya Silvas, MD;  Location: [facility] ENDOSCOPY;  Service: Endoscopy;  Laterality: N/A;  . TONSILLECTOMY     Family History:  Family History  Problem Relation Age of Onset  . Diabetes Mother   . COPD Mother   . Kidney disease Mother   . Depression Mother   . Heart attack Father   . Aneurysm Father   . Anxiety disorder Sister   . Depression Sister   . Diabetes Sister   . Hypertension Sister   . Atrial fibrillation Brother   . Spinal muscular atrophy Brother   . Breast cancer Sister   . Bone cancer Sister   . Depression Sister   . Anxiety disorder Sister   . Breast cancer Sister   . Colon cancer Sister   . Depression Sister   . Diabetes Sister   . COPD Sister   . Depression Sister   . Anxiety disorder Sister   . Diabetes Brother   . Depression Brother    Family Psychiatric  History: She has a sister who has depression as well. No family history of suicide Social History:  History  Alcohol Use No     History  Drug Use No    Social History   Social History  .  Marital status: Widowed    Spouse name: N/A  . Number of children: N/A  . Years of education: N/A   Social History Main Topics  . Smoking status: Never Smoker  . Smokeless tobacco: Never Used  . Alcohol use No  . Drug use: No  . Sexual activity: Not Currently    Birth control/ protection: Post-menopausal   Other Topics Concern  . None   Social History Narrative  . None   Additional Social History:    Allergies:   Allergies  Allergen Reactions  . Sulfa Antibiotics Shortness Of Breath    Labs:  Results for orders placed or  performed during the hospital encounter of 06/19/16 (from the past 48 hour(s))  Comprehensive metabolic panel     Status: Abnormal   Collection Time: 06/19/16 10:33 AM  Result Value Ref Range   Sodium 139 135 - 145 mmol/L   Potassium 3.9 3.5 - 5.1 mmol/L   Chloride 106 101 - 111 mmol/L   CO2 27 22 - 32 mmol/L   Glucose, Bld 119 (H) 65 - 99 mg/dL   BUN 13 6 - 20 mg/dL   Creatinine, Ser 0.74 0.44 - 1.00 mg/dL   Calcium 8.7 (L) 8.9 - 10.3 mg/dL   Total Protein 6.9 6.5 - 8.1 g/dL   Albumin 3.5 3.5 - 5.0 g/dL   AST 45 (H) 15 - 41 U/L   ALT 26 14 - 54 U/L   Alkaline Phosphatase 91 38 - 126 U/L   Total Bilirubin 1.5 (H) 0.3 - 1.2 mg/dL   GFR calc non Af Amer >60 >60 mL/min   GFR calc Af Amer >60 >60 mL/min    Comment: (NOTE) The eGFR has been calculated using the CKD EPI equation. This calculation has not been validated in all clinical situations. eGFR's persistently <60 mL/min signify possible Chronic Kidney Disease.    Anion gap 6 5 - 15  Ethanol     Status: None   Collection Time: 06/19/16 10:33 AM  Result Value Ref Range   Alcohol, Ethyl (B) <5 <5 mg/dL    Comment:        LOWEST DETECTABLE LIMIT FOR SERUM ALCOHOL IS 5 mg/dL FOR MEDICAL PURPOSES ONLY   Salicylate level     Status: None   Collection Time: 06/19/16 10:33 AM  Result Value Ref Range   Salicylate Lvl <4.3 2.8 - 30.0 mg/dL  Acetaminophen level     Status: Abnormal   Collection Time: 06/19/16 10:33 AM  Result Value Ref Range   Acetaminophen (Tylenol), Serum <10 (L) 10 - 30 ug/mL    Comment:        THERAPEUTIC CONCENTRATIONS VARY SIGNIFICANTLY. A RANGE OF 10-30 ug/mL MAY BE AN EFFECTIVE CONCENTRATION FOR MANY PATIENTS. HOWEVER, SOME ARE BEST TREATED AT CONCENTRATIONS OUTSIDE THIS RANGE. ACETAMINOPHEN CONCENTRATIONS >150 ug/mL AT 4 HOURS AFTER INGESTION AND >50 ug/mL AT 12 HOURS AFTER INGESTION ARE OFTEN ASSOCIATED WITH TOXIC REACTIONS.   cbc     Status: Abnormal   Collection Time: 06/19/16 10:33 AM   Result Value Ref Range   WBC 2.5 (L) 3.6 - 11.0 K/uL   RBC 4.40 3.80 - 5.20 MIL/uL   Hemoglobin 13.5 12.0 - 16.0 g/dL   HCT 38.7 35.0 - 47.0 %   MCV 88.0 80.0 - 100.0 fL   MCH 30.6 26.0 - 34.0 pg   MCHC 34.8 32.0 - 36.0 g/dL   RDW 17.3 (H) 11.5 - 14.5 %   Platelets 58 (L) 150 - 440  K/uL  Urine Drug Screen, Qualitative     Status: Abnormal   Collection Time: 06/19/16 10:33 AM  Result Value Ref Range   Tricyclic, Ur Screen POSITIVE (A) NONE DETECTED   Amphetamines, Ur Screen NONE DETECTED NONE DETECTED   MDMA (Ecstasy)Ur Screen NONE DETECTED NONE DETECTED   Cocaine Metabolite,Ur [place] NONE DETECTED NONE DETECTED   Opiate, Ur Screen NONE DETECTED NONE DETECTED   Phencyclidine (PCP) Ur S NONE DETECTED NONE DETECTED   Cannabinoid 50 Ng, Ur  NONE DETECTED NONE DETECTED   Barbiturates, Ur Screen NONE DETECTED NONE DETECTED   Benzodiazepine, Ur Scrn NONE DETECTED NONE DETECTED   Methadone Scn, Ur NONE DETECTED NONE DETECTED    Comment: (NOTE) 751  Tricyclics, urine               Cutoff 1000 ng/mL 200  Amphetamines, urine             Cutoff 1000 ng/mL 300  MDMA (Ecstasy), urine           Cutoff 500 ng/mL 400  Cocaine Metabolite, urine       Cutoff 300 ng/mL 500  Opiate, urine                   Cutoff 300 ng/mL 600  Phencyclidine (PCP), urine      Cutoff 25 ng/mL 700  Cannabinoid, urine              Cutoff 50 ng/mL 800  Barbiturates, urine             Cutoff 200 ng/mL 900  Benzodiazepine, urine           Cutoff 200 ng/mL 1000 Methadone, urine                Cutoff 300 ng/mL 1100 1200 The urine drug screen provides only a preliminary, unconfirmed 1300 analytical test result and should not be used for non-medical 1400 purposes. Clinical consideration and professional judgment should 1500 be applied to any positive drug screen result due to possible 1600 interfering substances. A more specific alternate chemical method 1700 must be used in order to obtain a confirmed analytical result.   1800 Gas chromato graphy / mass spectrometry (GC/MS) is the preferred 1900 confirmatory method.   Urinalysis, Complete w Microscopic     Status: Abnormal   Collection Time: 06/19/16 10:33 AM  Result Value Ref Range   Color, Urine AMBER (A) YELLOW    Comment: BIOCHEMICALS MAY BE AFFECTED BY COLOR   APPearance HAZY (A) CLEAR   Specific Gravity, Urine 1.018 1.005 - 1.030   pH 5.0 5.0 - 8.0   Glucose, UA NEGATIVE NEGATIVE mg/dL   Hgb urine dipstick SMALL (A) NEGATIVE   Bilirubin Urine NEGATIVE NEGATIVE   Ketones, ur NEGATIVE NEGATIVE mg/dL   Protein, ur NEGATIVE NEGATIVE mg/dL   Nitrite POSITIVE (A) NEGATIVE   Leukocytes, UA NEGATIVE NEGATIVE   RBC / HPF 0-5 0 - 5 RBC/hpf   WBC, UA 0-5 0 - 5 WBC/hpf   Bacteria, UA MANY (A) NONE SEEN   Squamous Epithelial / LPF 0-5 (A) NONE SEEN   Mucous PRESENT     Current Facility-Administered Medications  Medication Dose Route Frequency Provider Last Rate Last Dose  . busPIRone (BUSPAR) tablet 15 mg  15 mg Oral BID Gonzella Lex, MD   15 mg at 06/20/16 1044  . celecoxib (CELEBREX) capsule 100 mg  100 mg Oral Daily Gonzella Lex, MD   100 mg at  06/20/16 1046  . donepezil (ARICEPT) tablet 5 mg  5 mg Oral QHS Gonzella Lex, MD   5 mg at 06/19/16 2325  . escitalopram (LEXAPRO) tablet 10 mg  10 mg Oral QHS Gonzella Lex, MD   10 mg at 06/19/16 2327  . fluticasone (FLONASE) 50 MCG/ACT nasal spray 2 spray  2 spray Each Nare Daily Gonzella Lex, MD   2 spray at 06/20/16 1046  . levothyroxine (SYNTHROID, LEVOTHROID) tablet 75 mcg  75 mcg Oral QAC breakfast Gonzella Lex, MD   75 mcg at 06/20/16 1045  . pantoprazole (PROTONIX) EC tablet 40 mg  40 mg Oral Daily Gonzella Lex, MD   40 mg at 06/20/16 1046  . potassium chloride SA (K-DUR,KLOR-CON) CR tablet 10 mEq  10 mEq Oral Once Gonzella Lex, MD      . QUEtiapine (SEROQUEL) tablet 100 mg  100 mg Oral QHS Gonzella Lex, MD   100 mg at 06/19/16 2327   Current Outpatient Prescriptions  Medication  Sig Dispense Refill  . busPIRone (BUSPAR) 15 MG tablet Take 1 tablet (15 mg total) by mouth 2 (two) times daily. 180 tablet 0  . celecoxib (CELEBREX) 100 MG capsule TAKE ONE CAPSULE BY MOUTH TWICE A DAY    . escitalopram (LEXAPRO) 5 MG tablet Take 1 tablet (5 mg total) by mouth at bedtime. (Patient taking differently: Take 10 mg by mouth at bedtime. ) 30 tablet 3  . fluticasone (FLONASE) 50 MCG/ACT nasal spray Place 1 spray into both nostrils daily.    . hydrOXYzine (VISTARIL) 25 MG capsule Take 1 capsule (25 mg total) by mouth 3 (three) times daily as needed. 30 capsule 0  . KLOR-CON 10 10 MEQ tablet Take 10 mEq by mouth daily.    Marland Kitchen levothyroxine (SYNTHROID, LEVOTHROID) 75 MCG tablet Take 75 mcg by mouth daily before breakfast.    . omeprazole (PRILOSEC) 20 MG capsule Take 20 mg by mouth 2 (two) times daily.    . QUEtiapine (SEROQUEL) 100 MG tablet Take 1 tablet (100 mg total) by mouth at bedtime. (Patient taking differently: Take 400 mg by mouth at bedtime. ) 30 tablet 3  . traZODone (DESYREL) 100 MG tablet Take 1 tablet (100 mg total) by mouth at bedtime. 30 tablet 3  . donepezil (ARICEPT) 5 MG tablet Take 5 mg by mouth at bedtime.       Musculoskeletal: Strength & Muscle Tone: within normal limits Gait & Station: normal Patient leans: N/A  Psychiatric Specialty Exam: Physical Exam  Nursing note and vitals reviewed. Constitutional: She appears well-developed.  HENT:  Head: Normocephalic and atraumatic.  Eyes: Conjunctivae are normal. Pupils are equal, round, and reactive to light.  Neck: Normal range of motion.  Cardiovascular: Regular rhythm and normal heart sounds.   Respiratory: Effort normal. No respiratory distress.  GI: Soft.  Musculoskeletal: Normal range of motion.  Neurological: She is alert.  Skin: Skin is warm and dry.  Psychiatric: Judgment normal. Her affect is blunt. Her speech is delayed. She is slowed. Cognition and memory are normal. She exhibits a depressed mood.  She expresses suicidal ideation.    Review of Systems  Constitutional: Positive for malaise/fatigue.  HENT: Negative.   Eyes: Negative.   Respiratory: Negative.   Cardiovascular: Negative.   Gastrointestinal: Negative.   Musculoskeletal: Negative.   Skin: Negative.   Neurological: Positive for weakness.  Psychiatric/Behavioral: Positive for depression and suicidal ideas. Negative for hallucinations, memory loss and substance abuse. The patient  is nervous/anxious. The patient does not have insomnia.     Blood pressure (!) 137/58, pulse 84, temperature 98.6 F (37 C), temperature source Oral, resp. rate 16, height 5' 2"  (1.575 m), weight 81.6 kg (180 lb), SpO2 95 %.Body mass index is 32.92 kg/m.  General Appearance: Casual  Eye Contact:  Fair  Speech:  Slow  Volume:  Decreased  Mood:  Depressed  Affect:  Blunt  Thought Process:  Goal Directed  Orientation:  Full (Time, Place, and Person)  Thought Content:  Rumination  Suicidal Thoughts:  Yes.  without intent/plan  Homicidal Thoughts:  No  Memory:  Immediate;   Good Recent;   Fair Remote;   Fair  Judgement:  Fair  Insight:  Fair  Psychomotor Activity:  Decreased  Concentration:  Concentration: Fair  Recall:  [employer] of Knowledge:  Fair  Language:  Fair  Akathisia:  No  Handed:  Right  AIMS (if indicated):     Assets:  Desire for Improvement Financial Resources/Insurance Housing Physical Health Resilience Social Support  ADL's:  Intact  Cognition:  WNL  Sleep:        Treatment Plan Summary: Daily contact with patient to assess and evaluate symptoms and progress in treatment, Medication management and Plan Patient still needs inpatient hospitalization. Really no clinical change from yesterday. I spent some time talking with her. Family is very supportive and inpatient treatment. Patient is hesitant about it mostly out of fear and some disorganized thinking. Patient continues to be a good candidate for ECT. I spoke  with her about it in detail again today. I am going to go ahead and put in tentative orders for Monday hoping that we can get her in for treatment and I will make sure that we have the appropriate lab studies completed and done. Made this clear to patient as well. No other change to medication for now. Patient was reassured that Dr. Einar Grad will be here over the weekend  Disposition: Recommend psychiatric Inpatient admission when medically cleared. Supportive therapy provided about ongoing stressors.  Alethia Berthold, MD 06/20/2016 3:47 PM

## 2016-06-20 NOTE — ED Notes (Signed)
BEHAVIORAL HEALTH ROUNDING Patient sleeping: No. Patient alert and oriented: yes Behavior appropriate: Yes.  ; If no, describe:  Nutrition and fluids offered: yes Toileting and hygiene offered: Yes  Sitter present: q15 minute observations and security monitoring Law enforcement present: Yes  ODS

## 2016-06-20 NOTE — ED Notes (Signed)
Lunch was given to patient

## 2016-06-20 NOTE — ED Provider Notes (Signed)
-----------------------------------------   6:31 AM on 06/20/2016 -----------------------------------------   Blood pressure (!) 119/53, pulse 83, temperature 98 F (36.7 C), temperature source Oral, resp. rate 18, height 5' 2"  (1.575 m), weight 180 lb (81.6 kg), SpO2 93 %.  The patient had no acute events since last update.  Calm and cooperative at this time.  Disposition is pending Psychiatry/Behavioral Medicine team recommendations.     Paulette Blanch, MD 06/20/16 [phone]

## 2016-06-20 NOTE — ED Notes (Signed)
Pt was able to stand and walk to the bathroom with a walker with assistance. Pt used the bathroom on her own and walked back to the bed where pt was assisted back in to the bed w/o incident.

## 2016-06-20 NOTE — ED Notes (Signed)
Pt being reviewed for possible admission to [facility]. H&P and Assessment have been faxed to the [facility] Unit for the charge nurse to review and provide bed assignment.      Con Memos, MS, [place], LPCA Therapeutic Triage Specialist

## 2016-06-20 NOTE — ED Notes (Signed)
Patient refused breakfast

## 2016-06-20 NOTE — ED Notes (Signed)
Pt assisted to bathroom to urinate. Pt back to bed and attempting to go to sleep at this time.

## 2016-06-20 NOTE — ED Notes (Signed)
Patient is to be admitted to [facility] by Dr. Weber Cooks.  Attending Physician will be Dr. Bary Leriche.   Patient has been assigned to room 301, by Weeki [place].   Intake Paper Work has been signed and placed on patient chart.  ER staff is aware of the admission ( [facility] [facility].; ER MD; Amy Patient's Nurse & [facility] Patient Access).  Call report @ [phone], Charge Nurse will be notified when bed is ready

## 2016-06-20 NOTE — Progress Notes (Signed)
LCSW consulted with TTS and ED RN.  As per RN this patient was very ambulatory yesterday but last evening needed some supervision when going to bathroom. TTS was consulted and Elmyra Ricks will assess patients appropriateness for BMU. She is to have ECT.   [employer] LCSW [phone]

## 2016-06-20 NOTE — ED Notes (Signed)
Patient up to bathroom, with help

## 2016-06-20 NOTE — ED Notes (Signed)
Patient refused shower x2 notified  nurse Amy T

## 2016-06-20 NOTE — ED Notes (Signed)
Patient refused shower , will ask again

## 2016-06-21 DIAGNOSIS — Z79899 Other long term (current) drug therapy: Secondary | ICD-10-CM

## 2016-06-21 LAB — LIPID PANEL
CHOLESTEROL: 145 mg/dL (ref 0–200)
HDL: 44 mg/dL (ref >40)
LDL Cholesterol: 91 mg/dL (ref 0–99)
TRIGLYCERIDES: 51 mg/dL (ref <150)
Total CHOL/HDL Ratio: 3.3 RATIO
VLDL: 10 mg/dL (ref 0–40)

## 2016-06-21 LAB — TSH: TSH: 2.195 u[IU]/mL (ref 0.350–4.500)

## 2016-06-21 MED ORDER — ENSURE ENLIVE PO LIQD
237.0000 mL | Freq: Two times a day (BID) | ORAL | Status: DC
  Administered 2016-06-21 – 2016-07-03 (×12): 237 mL via ORAL

## 2016-06-21 NOTE — [facility] Group Notes (Signed)
[place] [facility] Group Notes:  (Nursing/MHT/Case Management/Adjunct)  Date:  06/21/2016  Time:  11:00 PM  Type of Therapy:  Psychoeducational Skills  Participation Level:  Did Not Attend  Summary of Progress/Problems:  Reece Agar 06/21/2016, 11:00 PM

## 2016-06-21 NOTE — Plan of Care (Signed)
Problem: Safety: Goal: Periods of time without injury will increase Outcome: Progressing Pt safe on the unit at this time   

## 2016-06-21 NOTE — Progress Notes (Signed)
[place] responded to an OR for the Pt who having suicidal thoughts. CH met with the Pt. Pt stated that she lives with the grandson who has been nice to her and takes good care of her. Pt indicated that her younger sister has made her stressful in the recent past. Pt conceded that her sister complains about many things to her and that is what depressed her. Pt denied having suicidal thoughts, Pt denied having homicide ideations. Pt stated that she has better things to live for than to die. Although the Pt appeared depressed, the Pt stated that she made a mistake telling her care provider that she had suicidal thoughts. Pt wanted to go home and see her grandson. Pt stated that staying here was making her have suicidal thoughts that she never had at home. [place] provided a ministry of prayer and presence.     06/21/16 2100  Clinical Encounter Type  Visited With Patient  Visit Type Initial;Spiritual support;Social support  Referral From Nurse  Consult/Referral To Chaplain  Spiritual Encounters  Spiritual Needs Prayer;Emotional;Other (Comment)  Stress Factors  Patient Stress Factors Not reviewed  Family Stress Factors None identified

## 2016-06-21 NOTE — Tx Team (Signed)
Initial Treatment Plan 06/21/2016 3:24 AM Cassidy [name] [id number]    PATIENT STRESSORS: Health problems Loss of daughter Medication change or noncompliance   PATIENT STRENGTHS: General fund of knowledge Motivation for treatment/growth Supportive family/friends   PATIENT IDENTIFIED PROBLEMS:   Risk for suicide  Depression  Grieving  "I want to leave"             DISCHARGE CRITERIA:  Improved stabilization in mood, thinking, and/or behavior Verbal commitment to aftercare and medication compliance  PRELIMINARY DISCHARGE PLAN: Attend aftercare/continuing care group Outpatient therapy  PATIENT/FAMILY INVOLVEMENT: This treatment plan has been presented to and reviewed with the patient, Cassidy [name].  The patient and family have been given the opportunity to ask questions and make suggestions.  Providence Crosby, RN 06/21/2016, 3:24 AM

## 2016-06-21 NOTE — Progress Notes (Signed)
D: Pt denies SI/HI/AVH. Pt is pleasant and cooperative. Pt continues to refuse to bathe, pt stated she was feeling better this evening. Pt said she was hopeful of going home Monday. Pt isolated to her room the majority of the evening.   A: Pt was offered support and encouragement. Pt was given scheduled medications. Pt was encourage to attend groups. Q 15 minute checks were done for safety.   R: Pt is taking medication.Pt receptive to treatment and safety maintained on unit.

## 2016-06-21 NOTE — H&P (Signed)
Psychiatric Admission Assessment Adult  Patient Identification: Cassidy [name] MRN:  [id number] Date of Evaluation:  06/21/2016 Chief Complaint:  major depression Principal Diagnosis: <principal problem not specified> Diagnosis:   Patient Active Problem List   Diagnosis Date Noted  . Severe recurrent major depression without psychotic features ([place]) [F33.2] 06/19/2016  . Hepatic encephalopathy ([place]) [K72.90] 08/13/2015  . Thrombocytopenia ([place]) [D69.6] 07/23/2015  . Amnesia [R41.3] 03/20/2015  . Major depression in remission ([place]) [F32.5] 03/12/2015  . Gonalgia [M25.569] 01/05/2015  . Absolute anemia [D64.9] 11/27/2014  . Arthritis [M19.90] 11/27/2014  . Acid reflux [K21.9] 11/27/2014  . Depression, major, recurrent, in partial remission ([place]) [F33.41] 11/27/2014  . Encounter for general adult medical examination without abnormal findings [Z00.00] 08/25/2014  . Chronic LBP [M54.5, G89.29] 08/15/2014  . 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 Infraspinatus tenosynovitis [M65.819] 05/01/2014  . Other synovitis and tenosynovitis, right shoulder [M65.811] 05/01/2014  . Difficulty in walking [R26.2] 11/30/2013  . Extreme obesity ([place]) [E66.01] 11/30/2013  . Morbid obesity ([place]) [E66.01] 11/30/2013  . Arthritis, degenerative [M19.90] 10/01/2013  . Steatohepatitis [K75.81] 10/01/2013  . Orthostasis [I95.1] 10/01/2013  . Appendicular ataxia [R27.0] 09/26/2013  . Fall [W19.XXXA] 09/26/2013  . Dizziness [R42] 09/07/2013  . Osteoporosis with fracture [M80.00XA] 09/06/2013  . Clinical depression [F32.9] 03/25/2012  . Adult hypothyroidism [E03.9] 03/25/2012  . Esophageal stenosis [K22.2] 07/03/2009  . Back ache [M54.9] 03/08/2003  . Anxiety state [F41.1] 12/27/2002   History of Present Illness: Patient is a 76 year old woman who has severe major depression and is a patient of mine in the outpatient clinic. Patient has been progressively getting worse in terms  of her depression for the past 2 years and most recently has stopped eating and feeling sad all the time. She reports that she does feel depressed all the time and wishes she were dead. Patient lost her adult daughter in an overdose this past year and has gotten worse since then. Patient was brought in by her sister to the clinic on Thursday and he made the decision to hospitalize patient given her decompensation. Patient has not been eating and states that she would kill herself if it were not for her grandson. She presents with a flat affect and is very withdrawn. Per sister she is just not eating and drinks a little bit of chocolate milk once in a while. She reports being compliant with her medications. We had tried changing her medications multiple times over the past year without much success. Patient does report being compliant with her medications but not sure what she takes it because she is by herself at home. Patient does not have any history of psychosis. There is no history of substance abuse. She has not had suicidal attempts previously.   Total Time spent with patient: 1 hour  Past Psychiatric History: Patient has had prior psychiatric admissions here at our hospital before. Denies suicide attempts. Patient has been on multiple antidepressants without consistent benefit. She is currently on Lexapro and BuSpar and Seroquel  Is the patient at risk to self? Yes.    Has the patient been a risk to self in the past 6 months? No.  Has the patient been a risk to self within the distant past? No.  Is the patient a risk to others? No.  Has the patient been a risk to others in the past 6 months? No.  Has the patient been a risk to others within the distant past? No.   Prior  Inpatient Therapy:  multiple hospitalizations Prior Outpatient Therapy:  yes  Alcohol Screening: 1. How often do you have a drink containing alcohol?: Never 9. Have you or someone else been injured as a result of your  drinking?: No 10. Has a relative or friend or a doctor or another health worker been concerned about your drinking or suggested you cut down?: No Alcohol Use Disorder Identification Test Final Score (AUDIT): 0 Brief Intervention: AUDIT score less than 7 or less-screening does not suggest unhealthy drinking-brief intervention not indicated Substance Abuse History in the last 12 months:  No. Consequences of Substance Abuse: Negative Previous Psychotropic Medications: Yes  Psychological Evaluations: No  Past Medical History:  Past Medical History:  Diagnosis Date  . Anxiety   . Depression   . Fatty liver   . GERD (gastroesophageal reflux disease)   . Hypothyroidism   . Obesity   . Thyroid disease     Past Surgical History:  Procedure Laterality Date  . APPENDECTOMY    . ESOPHAGOGASTRODUODENOSCOPY (EGD) WITH PROPOFOL N/A 12/20/2014   Procedure: ESOPHAGOGASTRODUODENOSCOPY (EGD) WITH PROPOFOL;  Surgeon: Manya Silvas, MD;  Location: [facility] ENDOSCOPY;  Service: Endoscopy;  Laterality: N/A;  . SAVORY DILATION N/A 12/20/2014   Procedure: SAVORY DILATION;  Surgeon: Manya Silvas, MD;  Location: [facility] ENDOSCOPY;  Service: Endoscopy;  Laterality: N/A;  . TONSILLECTOMY     Family History:  Family History  Problem Relation Age of Onset  . Diabetes Mother   . COPD Mother   . Kidney disease Mother   . Depression Mother   . Heart attack Father   . Aneurysm Father   . Anxiety disorder Sister   . Depression Sister   . Diabetes Sister   . Hypertension Sister   . Atrial fibrillation Brother   . Spinal muscular atrophy Brother   . Breast cancer Sister   . Bone cancer Sister   . Depression Sister   . Anxiety disorder Sister   . Breast cancer Sister   . Colon cancer Sister   . Depression Sister   . Diabetes Sister   . COPD Sister   . Depression Sister   . Anxiety disorder Sister   . Diabetes Brother   . Depression Brother    Family Psychiatric  History:  Tobacco Screening: Have  you used any form of tobacco in the last 30 days? (Cigarettes, Smokeless Tobacco, Cigars, and/or Pipes): No Social History:  History  Alcohol Use No     History  Drug Use No    Additional Social History:Patient lives with a grandson. Major stress is that she had a daughter who passed away in the fairly recent past and she continues to grieve very hard. 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 Obviously does not work currently.       Allergies:   Allergies  Allergen Reactions  . Sulfa Antibiotics Shortness Of Breath   Lab Results:  Results for orders placed or performed during the hospital encounter of 06/20/16 (from the past 48 hour(s))  Lipid panel     Status: None   Collection Time: 06/21/16  7:01 AM  Result Value Ref Range   Cholesterol 145 0 - 200 mg/dL   Triglycerides 51 <150 mg/dL   HDL 44 >40 mg/dL   Total CHOL/HDL Ratio 3.3 RATIO   VLDL 10 0 - 40 mg/dL   LDL Cholesterol 91 0 - 99 mg/dL    Comment:        Total Cholesterol/HDL:CHD  Risk Coronary Heart Disease Risk Table                     Men   Women  1/2 Average Risk   3.4   3.3  Average Risk       5.0   4.4  2 X Average Risk   9.6   7.1  3 X Average Risk  23.4   11.0        Use the calculated Patient Ratio above and the CHD Risk Table to determine the patient's CHD Risk.        ATP III CLASSIFICATION (LDL):  <100     mg/dL   Optimal  100-129  mg/dL   Near or Above                    Optimal  130-159  mg/dL   Borderline  160-189  mg/dL   High  >190     mg/dL   Very High   TSH     Status: None   Collection Time: 06/21/16  7:01 AM  Result Value Ref Range   TSH 2.195 0.350 - 4.500 uIU/mL    Comment: Performed by a 3rd Generation assay with a functional sensitivity of <=0.01 uIU/mL.    Blood Alcohol level:  Lab Results  Component Value Date   ETH <5 91/47/8295    Metabolic Disorder Labs:  No results found for: HGBA1C, MPG No results found for: PROLACTIN Lab Results  Component Value Date    CHOL 145 06/21/2016   TRIG 51 06/21/2016   HDL 44 06/21/2016   CHOLHDL 3.3 06/21/2016   VLDL 10 06/21/2016   LDLCALC 91 06/21/2016   LDLCALC 70 08/22/2013    Current Medications: Current Facility-Administered Medications  Medication Dose Route Frequency Provider Last Rate Last Dose  . acetaminophen (TYLENOL) tablet 650 mg  650 mg Oral Q6H PRN Gonzella Lex, MD      . alum & mag hydroxide-simeth (MAALOX/MYLANTA) 200-200-20 MG/5ML suspension 30 mL  30 mL Oral Q4H PRN Gonzella Lex, MD      . busPIRone (BUSPAR) tablet 15 mg  15 mg Oral BID Gonzella Lex, MD   15 mg at 06/21/16 0819  . celecoxib (CELEBREX) capsule 100 mg  100 mg Oral Daily Gonzella Lex, MD   100 mg at 06/21/16 0819  . donepezil (ARICEPT) tablet 5 mg  5 mg Oral QHS John T Clapacs, MD      . escitalopram (LEXAPRO) tablet 10 mg  10 mg Oral QHS Gonzella Lex, MD      . fluticasone (FLONASE) 50 MCG/ACT nasal spray 2 spray  2 spray Each Nare Daily Gonzella Lex, MD   2 spray at 06/21/16 0820  . levothyroxine (SYNTHROID, LEVOTHROID) tablet 75 mcg  75 mcg Oral QAC breakfast Gonzella Lex, MD   75 mcg at 06/21/16 0819  . magnesium hydroxide (MILK OF MAGNESIA) suspension 30 mL  30 mL Oral Daily PRN Gonzella Lex, MD      . pantoprazole (PROTONIX) EC tablet 40 mg  40 mg Oral QAC breakfast Gonzella Lex, MD   40 mg at 06/21/16 0819  . QUEtiapine (SEROQUEL) tablet 100 mg  100 mg Oral QHS Ugonna Keirsey, MD   100 mg at 06/21/16 0028   PTA Medications: Prescriptions Prior to Admission  Medication Sig Dispense Refill Last Dose  . busPIRone (BUSPAR) 15 MG tablet Take 1 tablet (15 mg total)  by mouth 2 (two) times daily. 180 tablet 0 06/20/2016 at Unknown time  . celecoxib (CELEBREX) 100 MG capsule TAKE ONE CAPSULE BY MOUTH TWICE A DAY   06/20/2016 at Unknown time  . donepezil (ARICEPT) 5 MG tablet Take 5 mg by mouth at bedtime.    06/20/2016 at Unknown time  . escitalopram (LEXAPRO) 5 MG tablet Take 1 tablet (5 mg total) by mouth at  bedtime. (Patient taking differently: Take 10 mg by mouth at bedtime. ) 30 tablet 3 06/20/2016 at Unknown time  . fluticasone (FLONASE) 50 MCG/ACT nasal spray Place 1 spray into both nostrils daily.   06/20/2016 at Unknown time  . hydrOXYzine (VISTARIL) 25 MG capsule Take 1 capsule (25 mg total) by mouth 3 (three) times daily as needed. 30 capsule 0 06/20/2016 at Unknown time  . KLOR-CON 10 10 MEQ tablet Take 10 mEq by mouth daily.   06/20/2016 at Unknown time  . levothyroxine (SYNTHROID, LEVOTHROID) 75 MCG tablet Take 75 mcg by mouth daily before breakfast.   06/20/2016 at Unknown time  . omeprazole (PRILOSEC) 20 MG capsule Take 20 mg by mouth 2 (two) times daily.   06/20/2016 at Unknown time  . QUEtiapine (SEROQUEL) 100 MG tablet Take 1 tablet (100 mg total) by mouth at bedtime. (Patient taking differently: Take 400 mg by mouth at bedtime. ) 30 tablet 3 06/20/2016 at Unknown time  . traZODone (DESYREL) 100 MG tablet Take 1 tablet (100 mg total) by mouth at bedtime. 30 tablet 3 Past Week at Unknown time    Musculoskeletal: Strength & Muscle Tone: within normal limits Gait & Station: normal Patient leans: N/A  Psychiatric Specialty Exam: Physical Exam  ROS  Blood pressure (!) 144/65, pulse 91, temperature 97.7 F (36.5 C), temperature source Oral, resp. rate 18, height 5' 2"  (1.575 m), weight 180 lb (81.6 kg), SpO2 94 %.Body mass index is 32.92 kg/m.  General Appearance: Casual  Eye Contact:  Fair  Speech:  Clear and Coherent  Volume:  Decreased  Mood:  Depressed, Dysphoric and Hopeless  Affect:  Constricted and Depressed  Thought Process:  Coherent  Orientation:  Full (Time, Place, and Person)  Thought Content:  Logical  Suicidal Thoughts:  No  Homicidal Thoughts:  No  Memory:  Immediate;   Fair Recent;   Fair Remote;   Fair  Judgement:  Impaired  Insight:  Shallow  Psychomotor Activity:  Normal  Concentration:  Concentration: Fair and Attention Span: Fair  Recall:  [employer] of  Knowledge:  Fair  Language:  Fair  Akathisia:  No  Handed:  Right  AIMS (if indicated):     Assets:  Housing Social Support  ADL's:  Intact  Cognition:  WNL  Sleep:  Number of Hours: 4.15    Treatment Plan Summary: Daily contact with patient to assess and evaluate symptoms and progress in treatment and Medication management  Observation Level/Precautions:  15 minute checks  Laboratory:  Lipid panel wnl, TSH, wnl  Psychotherapy:  Patient needs grief counselling to cope with death of daughter  Medications:  As above, Lexapro was increased to 10 mg.   Consultations:  As needed  Discharge Concerns:  When stabilized and safe  Estimated LOS: 5-6 days  Other:     Physician Treatment Plan for Primary Diagnosis: Major Depressive Disorder , severe Long Term Goal(s): Improvement in symptoms so as ready for discharge  Short Term Goals: Ability to identify changes in lifestyle to reduce recurrence of condition will improve, Ability to  verbalize feelings will improve, Ability to disclose and discuss suicidal ideas, Ability to demonstrate self-control will improve, Ability to identify and develop effective coping behaviors will improve, Ability to maintain clinical measurements within normal limits will improve and Compliance with prescribed medications will improve  Physician Treatment Plan for Secondary Diagnosis: Active Problems:   Severe recurrent major depression without psychotic features ([place])  Long Term Goal(s): Improvement in symptoms so as ready for discharge  Short Term Goals: Ability to identify changes in lifestyle to reduce recurrence of condition will improve, Ability to verbalize feelings will improve, Ability to disclose and discuss suicidal ideas, Ability to demonstrate self-control will improve, Ability to identify and develop effective coping behaviors will improve, Ability to maintain clinical measurements within normal limits will improve and Compliance with prescribed  medications will improve  I certify that inpatient services furnished can reasonably be expected to improve the patient's condition.    Elvin So, MD 3/3/20189:33 AM

## 2016-06-21 NOTE — [facility] Suicide Risk Assessment (Signed)
[facility] Admission Suicide Risk Assessment   Nursing information obtained from:    Demographic factors:   Cassidy [name] is Cassidy 76 year old Caucasian woman single Current Mental Status:   patient is very depressed and feels hopeless Loss Factors:   loss of daughter last year Historical Factors:   lOng history of depression Risk Reduction Factors:   involved family  Total Time spent with patient: 1 hour Principal Problem: <principal problem not specified> Diagnosis:   Patient Active Problem List   Diagnosis Date Noted  . Severe recurrent major depression without psychotic features ([place]) [F33.2] 06/19/2016  . Hepatic encephalopathy ([place]) [K72.90] 08/13/2015  . Thrombocytopenia ([place]) [D69.6] 07/23/2015  . Amnesia [R41.3] 03/20/2015  . Major depression in remission ([place]) [F32.5] 03/12/2015  . Gonalgia [M25.569] 01/05/2015  . Absolute anemia [D64.9] 11/27/2014  . Arthritis [M19.90] 11/27/2014  . Acid reflux [K21.9] 11/27/2014  . Depression, major, recurrent, in partial remission ([place]) [F33.41] 11/27/2014  . Encounter for general adult medical examination without abnormal findings [Z00.00] 08/25/2014  . Chronic LBP [M54.5, G89.29] 08/15/2014  . 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 Infraspinatus tenosynovitis [M65.819] 05/01/2014  . Other synovitis and tenosynovitis, right shoulder [M65.811] 05/01/2014  . Difficulty in walking [R26.2] 11/30/2013  . Extreme obesity ([place]) [E66.01] 11/30/2013  . Morbid obesity ([place]) [E66.01] 11/30/2013  . Arthritis, degenerative [M19.90] 10/01/2013  . Steatohepatitis [K75.81] 10/01/2013  . Orthostasis [I95.1] 10/01/2013  . Appendicular ataxia [R27.0] 09/26/2013  . Fall [W19.XXXA] 09/26/2013  . Dizziness [R42] 09/07/2013  . Osteoporosis with fracture [M80.00XA] 09/06/2013  . Clinical depression [F32.9] 03/25/2012  . Adult hypothyroidism [E03.9] 03/25/2012  . Esophageal stenosis [K22.2] 07/03/2009  . Back ache [M54.9] 03/08/2003  .  Anxiety state [F41.1] 12/27/2002   Subjective Data: Patient is Cassidy 76 year old woman with severe depression.  Continued Clinical Symptoms:  Alcohol Use Disorder Identification Test Final Score (AUDIT): 0 The "Alcohol Use Disorders Identification Test", Guidelines for Use in Primary Care, Second Edition.  World Pharmacologist [facility]). Score between 0-7:  no or low risk or alcohol related problems. Score between 8-15:  moderate risk of alcohol related problems. Score between 16-19:  high risk of alcohol related problems. Score 20 or above:  warrants further diagnostic evaluation for alcohol dependence and treatment.   CLINICAL FACTORS:   Depression:   Anhedonia Hopelessness Severe   Musculoskeletal: Strength & Muscle Tone: within normal limits Gait & Station: normal Patient leans: N/Cassidy  Psychiatric Specialty Exam: Physical Exam  ROS  Blood pressure (!) 144/65, pulse 91, temperature 97.7 F (36.5 C), temperature source Oral, resp. rate 18, height 5' 2"  (1.575 m), weight 180 lb (81.6 kg), SpO2 94 %.Body mass index is 32.92 kg/m.   General Appearance: Casual  Eye Contact:  Fair  Speech:  Clear and Coherent  Volume:  Decreased  Mood:  Depressed, Dysphoric and Hopeless  Affect:  Constricted and Depressed  Thought Process:  Coherent  Orientation:  Full (Time, Place, and Person)  Thought Content:  Logical  Suicidal Thoughts:  No  Homicidal Thoughts:  No  Memory:  Immediate;   Fair Recent;   Fair Remote;   Fair  Judgement:  Impaired  Insight:  Shallow  Psychomotor Activity:  Normal  Concentration:  Concentration: Fair and Attention Span: Fair  Recall:  [employer] of Knowledge:  Fair  Language:  Fair  Akathisia:  No  Handed:  Right  AIMS (if indicated):     Assets:  Housing Social Support  ADL's:  Intact  Cognition:  WNL  Sleep:  Number of Hours: 4.15    Treatment Plan Summary: Daily contact with patient to assess and evaluate symptoms and progress in treatment and  Medication management  Observation Level/Precautions:  15 minute checks  Laboratory:  Lipid panel wnl, TSH, wnl  Psychotherapy:  Patient needs grief counselling to cope with death of daughter  Medications:  As above, Lexapro was increased to 10 mg.   Consultations:  As needed  Discharge Concerns:  When stabilized and safe  Estimated LOS: 5-6 days  Other:     Physician Treatment Plan for Primary Diagnosis: Major Depressive Disorder , severe Long Term Goal(s): Improvement in symptoms so as ready for discharge  Short Term Goals: Ability to identify changes in lifestyle to reduce recurrence of condition will improve, Ability to verbalize feelings will improve, Ability to disclose and discuss suicidal ideas, Ability to demonstrate self-control will improve, Ability to identify and develop effective coping behaviors will improve, Ability to maintain clinical measurements within normal limits will improve and Compliance with prescribed medications will improve  Physician Treatment Plan for Secondary Diagnosis: Active Problems:   Severe recurrent major depression without psychotic features ([place])  Long Term Goal(s): Improvement in symptoms so as ready for discharge  Short Term Goals: Ability to identify changes in lifestyle to reduce recurrence of condition will improve, Ability to verbalize feelings will improve, Ability to disclose and discuss suicidal ideas, Ability to demonstrate self-control will improve, Ability to identify and develop effective coping behaviors will improve, Ability to maintain clinical measurements within normal limits will improve and Compliance with prescribed medications will improve   COGNITIVE FEATURES THAT CONTRIBUTE TO RISK:  Closed-mindedness and Polarized thinking    SUICIDE RISK:   Moderate:  Frequent suicidal ideation with limited intensity, and duration, some specificity in terms of plans, no associated intent, good self-control, limited  dysphoria/symptomatology, some risk factors present, and identifiable protective factors, including available and accessible social support.  PLAN OF CARE: see above  I certify that inpatient services furnished can reasonably be expected to improve the patient's condition.   Elvin So, MD 06/21/2016, 11:12 AM

## 2016-06-21 NOTE — Progress Notes (Signed)
Admission Note:  76 yr female who presents IVC in no acute distress for the treatment of SI and Depression. Pt appears flat and depressed. Pt was calm and cooperative with admission process. Pt stated her daughter died August 19, 2015 and her birthday was [birth year] and pt has been having increased depression and haven't been eating or drinking. Pt stated she was having increased depression for the past 2 months. Pt stated her outpatient provider recommended pt come in for evaluation and possible ECT  A:Skin was assessed by female nurse and found to be clear of any abnormal marks PT searched and no contraband found, POC and unit policies explained and understanding verbalized. Consents obtained. Food and fluids offered, and fluids accepted.  R: Pt had no additional questions or concerns.

## 2016-06-21 NOTE — Progress Notes (Signed)
D:  Affect flat  And depressed . Patient  Noted to perk up in the afternoon .  Patient encourage to shower . Patient stated she was not going to take a bath.  Resting in bed , Able to go to dayroom for  Meals  Patient unaware of possibility of ECT  Stated she is going home  On  Monday  Appetite poor during shift .  Patient went  Outside with her peers Denies Depression ,Denies suicidal  homicidal ideations  .  No auditory hallucinations  No pain concerns .No Appropriate ADL'S. Limited Interacting with peers and staff.  A: Encourage patient participation with unit programming . Instruction  Given on  Medication , verbalize understanding. R: Voice no other concerns. Staff continue to monitor

## 2016-06-21 NOTE — ED Notes (Signed)
No answer to repeated calls to unit, RN sup finally contacted and direct staff to call this RN

## 2016-06-21 NOTE — ED Notes (Signed)
Attempt to call report, RN not available, left number

## 2016-06-21 NOTE — BHH Counselor (Signed)
Adult Comprehensive Assessment  Patient ID: Cassidy [name], female   DOB: 1940-05-26, 76 y.o.   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 Patient states she gets a long with him well. How long has patient lived in current situation?: 29 years What is atmosphere in current home: Comfortable, Loving, Supportive  Family History:  Marital status: Widowed Widowed, when?: July 02, 2002 husband died of heart attack Are you sexually active?: No What is your sexual orientation?: heterosexual Has your sexual activity been affected by drugs, alcohol, medication, or emotional stress?: n/a Does patient have children?: Yes How many children?: 3 How is patient's relationship with their children?: 3 daughters. 1 daughter died about 1 year ago  Childhood History:  By whom was/is the patient raised?: Both parents Does patient have siblings?: Yes Number of Siblings: 7 Description of patient's current relationship with siblings: 2 brothers 5 sisters. 1 sister passed away a few years ago.  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 completed: Environmental education officer Currently a student?: No Name of school: n/a Learning disability?: No  Employment/Work Situation:   Employment situation: Unemployed Patient's job has been impacted by current illness: No What is the longest time patient has a held a job?: 12 years 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 Has patient ever been in the [employer]?: No Has patient ever served in combat?: No Did You Receive Any Psychiatric Treatment/Services While in Passenger transport manager?: No Are There Guns or Chiropractor in [place]?: No Are These Psychologist, educational?:  (n/a)  Financial Resources:   Financial resources: Receives SSI Does patient have a Programmer, applications or guardian?: No  Alcohol/Substance Abuse:   What has been your use of drugs/alcohol within the last 12 months?: Patient denies If attempted suicide, did drugs/alcohol play a role in this?: No Alcohol/Substance Abuse Treatment Hx: Denies past history Has alcohol/substance abuse ever caused legal problems?: No  Social Support System:   Pensions consultant Support System: Good Describe Community Support System: Patient has strong support from sisters and her grandson Type of faith/religion: Baptist How does patient's faith help to cope with current illness?: Patient states her religion is really important to her.   Leisure/Recreation:   Leisure and Hobbies: "I'm not sure"  Strengths/Needs:   What things does the patient do well?: watching television, support her family In what areas does patient struggle / problems for patient: depression, anxiety, poor sleep and appetite.   Discharge Plan:   Does patient have access to transportation?: Yes Will patient be returning to same living situation after discharge?: Yes Currently receiving community mental health services: Yes (From Whom) (Dr. Einar Grad) Does patient have financial barriers related to discharge  medications?: No  Summary/Recommendations:   Patient is a 76 year old female admitted involuntarily with a diagnosis of severe recurrent major depression without psychotic features. Information was obtained from psychosocial assessment completed with patient and chart review conducted by this evaluator. Patient presented to the hospital with her sister after her outpatient appointment with her psychiatrist. Patient reports primary triggers for admission were loss of appetite, loss of motivation, and worsening depression since the death of her youngest daughter about a year ago. Patient denies SI/HI/AVH during this assessment. Patient will benefit from crisis stabilization, medication evaluation, group therapy and psycho education in addition to case management for discharge. At discharge, it is recommended that patient remain compliant with established discharge plan and continued treatment.   Ronith Berti G. [place], [place] 06/21/2016 12:12 PM

## 2016-06-21 NOTE — BHH Group Notes (Signed)
[place] LCSW Group Therapy  06/21/2016 2:16 PM  Type of Therapy:  Group Therapy  Participation Level:  Patient did not attend group. CSW invited patient to group.   Summary of Progress/Problems: Communications: Patients identify how individuals communicate with one another appropriately and inappropriately. Patients will be guided to discuss their thoughts, feelings, and behaviors related to barriers when communicating. The group will process together ways to execute positive and appropriate communications.   [name] G. [place], [place] 06/21/2016, 2:22 PM

## 2016-06-22 LAB — URINALYSIS, COMPLETE (UACMP) WITH MICROSCOPIC
Bilirubin Urine: NEGATIVE
Glucose, UA: NEGATIVE mg/dL
Ketones, ur: NEGATIVE mg/dL
Nitrite: NEGATIVE
PH: 6 (ref 5.0–8.0)
Protein, ur: NEGATIVE mg/dL
Specific Gravity, Urine: 1.006 (ref 1.005–1.030)

## 2016-06-22 LAB — HEMOGLOBIN A1C
HEMOGLOBIN A1C: 5.3 % (ref 4.8–5.6)
Mean Plasma Glucose: 105 mg/dL

## 2016-06-22 LAB — URINE CULTURE
Culture: >=100000 — AB
SPECIAL REQUESTS: NORMAL

## 2016-06-22 NOTE — BHH Suicide Risk Assessment (Addendum)
[place] INPATIENT:  Family/Significant Other Suicide Prevention Education  Suicide Prevention Education:  Education Completed; [name] Harrison(sister [phone]), has been identified by the patient as the family member/significant other with whom the patient will be residing, and identified as the person(s) who will aid the patient in the event of a mental health crisis (suicidal ideations/suicide attempt).  With written consent from the patient, the family member/significant other has been provided the following suicide prevention education, prior to the and/or following the discharge of the patient.  The suicide prevention education provided includes the following:  Suicide risk factors  Suicide prevention and interventions  National Suicide Hotline telephone number  [facility] assessment telephone number  [facility] Emergency Assistance [place] and/or Residential Mobile Crisis Unit telephone number  Request made of family/significant other to:  Remove weapons (e.g., guns, rifles, knives), all items previously/currently identified as safety concern.    Remove drugs/medications (over-the-counter, prescriptions, illicit drugs), all items previously/currently identified as a safety concern.  The family member/significant other verbalizes understanding of the suicide prevention education information provided.  The family member/significant other agrees to remove the items of safety concern listed above.  Izaah Westman G. [place], [place] 06/22/2016, 9:55 AM

## 2016-06-22 NOTE — BHH Group Notes (Signed)
[place] LCSW Group Therapy  06/22/2016 2:02 PM  Type of Therapy:  Group Therapy  Participation Level:  Minimal  Participation Quality:  Attentive  Affect:  Appropriate  Cognitive:  Alert  Insight:  Poor  Engagement in Therapy:  Limited  Modes of Intervention:  Activity, Discussion, Education, Problem-solving, Reality Testing and Support  Summary of Progress/Problems: Coping Skills: Patients defined and discussed healthy coping skills. Patients identified healthy coping skills they would like to try during hospitalization and after discharge. CSW offered insight to varying coping skills that may have been new to patients such as practicing mindfulness. Patient was appropriate during group but when asked any questions patient would respond, "It just would help if I could get out of here!". Patient also stated "I'm being discharge Monday anyway, that's what the doctor told me". CSW provided support to patient.   Cassidy [name] G. [name], [place] 06/22/2016, 2:06 PM

## 2016-06-22 NOTE — Progress Notes (Signed)
Patient with depressed affect, cooperative behavior with meals, meds and plan of care. No SI/HI at this time. Patient states she is sad due to "being sad because my daughter died around this time". Quiet with peers, verbalizes needs appropriately when staff initiates interactions. Meals monitored, po fluids encouraged and nutritional shakes provided. Patient aware urine sample is requested and states " I just used the toilet". Patient remains on falls protocol, uses wheelchair in unit, supervision of one for transfers from bed to chair to toilet and safety maintained.

## 2016-06-22 NOTE — BHH Group Notes (Signed)
[place] Group Notes:  (Nursing/MHT/Case Management/Adjunct)  Date:  06/22/2016  Time:  9:58 PM  Type of Therapy:  Evening Wrap-up Group  Participation Level:  Did Not Attend  Participation Quality:  N/A\  Affect:  N/A  Cognitive:  N/A  Insight:  None  Engagement in Group:  Did Not Attend  Modes of Intervention:  Discussion  Summary of Progress/Problems:  Levonne Spiller 06/22/2016, 9:58 PM

## 2016-06-22 NOTE — Progress Notes (Signed)
[facility] MD Progress Note  06/22/2016 10:09 AM Cassidy [name]  MRN:  [id number] Subjective:Patient is a 76 yo woman with severe depression , decompensating over past few months with poor nutrition and not responsive to multiple medication changes, seen by outpatient psychiatrist at [facility]. Admitted for stabilization .  Patient reports being unhappy being in the hospital. Per her sister she has not showered in months.She was initially resistant but showered with help of tech. She has not been eating, consumption less than 10 % of her meals. She is compliant with medications. Minimal participation in groups. Continues to say that her grandson is the only one keeping her from dying.  Principal Problem: <principal problem not specified> Diagnosis:   Patient Active Problem List   Diagnosis Date Noted  . Severe recurrent major depression without psychotic features ([place]) [F33.2] 06/19/2016  . Hepatic encephalopathy ([place]) [K72.90] 08/13/2015  . Thrombocytopenia ([place]) [D69.6] 07/23/2015  . Amnesia [R41.3] 03/20/2015  . Major depression in remission ([place]) [F32.5] 03/12/2015  . Gonalgia [M25.569] 01/05/2015  . Absolute anemia [D64.9] 11/27/2014  . Arthritis [M19.90] 11/27/2014  . Acid reflux [K21.9] 11/27/2014  . Depression, major, recurrent, in partial remission ([place]) [F33.41] 11/27/2014  . Encounter for general adult medical examination without abnormal findings [Z00.00] 08/25/2014  . Chronic LBP [M54.5, G89.29] 08/15/2014  . 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 Infraspinatus tenosynovitis [M65.819] 05/01/2014  . Other synovitis and tenosynovitis, right shoulder [M65.811] 05/01/2014  . Difficulty in walking [R26.2] 11/30/2013  . Extreme obesity ([place]) [E66.01] 11/30/2013  . Morbid obesity ([place]) [E66.01] 11/30/2013  . Arthritis, degenerative [M19.90] 10/01/2013  . Steatohepatitis [K75.81] 10/01/2013  . Orthostasis [I95.1] 10/01/2013  . Appendicular ataxia [R27.0]  09/26/2013  . Fall [W19.XXXA] 09/26/2013  . Dizziness [R42] 09/07/2013  . Osteoporosis with fracture [M80.00XA] 09/06/2013  . Clinical depression [F32.9] 03/25/2012  . Adult hypothyroidism [E03.9] 03/25/2012  . Esophageal stenosis [K22.2] 07/03/2009  . Back ache [M54.9] 03/08/2003  . Anxiety state [F41.1] 12/27/2002   Total Time spent with patient: 20 minutes  Past Psychiatric History: Patient has had prior psychiatric admissions here at our hospital before. Denies suicide attempts. Patient has been on multiple antidepressants without consistent benefit. She is currently on Lexapro and BuSpar and Seroquel  Past Medical History:  Past Medical History:  Diagnosis Date  . Anxiety   . Depression   . Fatty liver   . GERD (gastroesophageal reflux disease)   . Hypothyroidism   . Obesity   . Thyroid disease     Past Surgical History:  Procedure Laterality Date  . APPENDECTOMY    . ESOPHAGOGASTRODUODENOSCOPY (EGD) WITH PROPOFOL N/A 12/20/2014   Procedure: ESOPHAGOGASTRODUODENOSCOPY (EGD) WITH PROPOFOL;  Surgeon: Manya Silvas, MD;  Location[facility] ENDOSCOPY;  Service: Endoscopy;  Laterality: N/A;  . SAVORY DILATION N/A 12/20/2014   Procedure: SAVORY DILATION;  Surgeon: Manya Silvas, MD;  Location: [facility] ENDOSCOPY;  Service: Endoscopy;  Laterality: N/A;  . TONSILLECTOMY     Family History:  Family History  Problem Relation Age of Onset  . Diabetes Mother   . COPD Mother   . Kidney disease Mother   . Depression Mother   . Heart attack Father   . Aneurysm Father   . Anxiety disorder Sister   . Depression Sister   . Diabetes Sister   . Hypertension Sister   . Atrial fibrillation Brother   . Spinal muscular atrophy Brother   . Breast cancer Sister   . Bone cancer Sister   .  Depression Sister   . Anxiety disorder Sister   . Breast cancer Sister   . Colon cancer Sister   . Depression Sister   . Diabetes Sister   . COPD Sister   . Depression Sister   . Anxiety disorder  Sister   . Diabetes Brother   . Depression Brother    Family Psychiatric  History: see above Social History:  History  Alcohol Use No     History  Drug Use No    Social History   Social History  . Marital status: Widowed    Spouse name: N/A  . Number of children: N/A  . Years of education: N/A   Social History Main Topics  . Smoking status: Never Smoker  . Smokeless tobacco: Never Used  . Alcohol use No  . Drug use: No  . Sexual activity: Not Currently    Birth control/ protection: Post-menopausal   Other Topics Concern  . None   Social History Narrative  . None   Additional Social History:                      Sleep: Fair  Appetite:  Poor  Current Medications: Current Facility-Administered Medications  Medication Dose Route Frequency Provider Last Rate Last Dose  . acetaminophen (TYLENOL) tablet 650 mg  650 mg Oral Q6H PRN Gonzella Lex, MD      . alum & mag hydroxide-simeth (MAALOX/MYLANTA) 200-200-20 MG/5ML suspension 30 mL  30 mL Oral Q4H PRN Gonzella Lex, MD      . busPIRone (BUSPAR) tablet 15 mg  15 mg Oral BID Gonzella Lex, MD   15 mg at 06/22/16 0800  . celecoxib (CELEBREX) capsule 100 mg  100 mg Oral Daily Gonzella Lex, MD   100 mg at 06/22/16 0801  . donepezil (ARICEPT) tablet 5 mg  5 mg Oral QHS Gonzella Lex, MD   5 mg at 06/21/16 2131  . escitalopram (LEXAPRO) tablet 10 mg  10 mg Oral QHS Gonzella Lex, MD   10 mg at 06/21/16 2130  . feeding supplement (ENSURE ENLIVE) (ENSURE ENLIVE) liquid 237 mL  237 mL Oral BID BM Bridie Colquhoun, MD   237 mL at 06/22/16 1000  . fluticasone (FLONASE) 50 MCG/ACT nasal spray 2 spray  2 spray Each Nare Daily Gonzella Lex, MD   2 spray at 06/22/16 0801  . levothyroxine (SYNTHROID, LEVOTHROID) tablet 75 mcg  75 mcg Oral QAC breakfast Gonzella Lex, MD   75 mcg at 06/22/16 0800  . magnesium hydroxide (MILK OF MAGNESIA) suspension 30 mL  30 mL Oral Daily PRN Gonzella Lex, MD      . pantoprazole  (PROTONIX) EC tablet 40 mg  40 mg Oral QAC breakfast Gonzella Lex, MD   40 mg at 06/22/16 0800  . QUEtiapine (SEROQUEL) tablet 100 mg  100 mg Oral QHS Senita Corredor, MD   100 mg at 06/21/16 2130    Lab Results:  Results for orders placed or performed during the hospital encounter of 06/20/16 (from the past 48 hour(s))  Lipid panel     Status: None   Collection Time: 06/21/16  7:01 AM  Result Value Ref Range   Cholesterol 145 0 - 200 mg/dL   Triglycerides 51 <150 mg/dL   HDL 44 >40 mg/dL   Total CHOL/HDL Ratio 3.3 RATIO   VLDL 10 0 - 40 mg/dL   LDL Cholesterol 91 0 - 99 mg/dL  Comment:        Total Cholesterol/HDL:CHD Risk Coronary Heart Disease Risk Table                     Men   Women  1/2 Average Risk   3.4   3.3  Average Risk       5.0   4.4  2 X Average Risk   9.6   7.1  3 X Average Risk  23.4   11.0        Use the calculated Patient Ratio above and the CHD Risk Table to determine the patient's CHD Risk.        ATP III CLASSIFICATION (LDL):  <100     mg/dL   Optimal  100-129  mg/dL   Near or Above                    Optimal  130-159  mg/dL   Borderline  160-189  mg/dL   High  >190     mg/dL   Very High   TSH     Status: None   Collection Time: 06/21/16  7:01 AM  Result Value Ref Range   TSH 2.195 0.350 - 4.500 uIU/mL    Comment: Performed by a 3rd Generation assay with a functional sensitivity of <=0.01 uIU/mL.    Blood Alcohol level:  Lab Results  Component Value Date   ETH <5 65/06/5463    Metabolic Disorder Labs: No results found for: HGBA1C, MPG No results found for: PROLACTIN Lab Results  Component Value Date   CHOL 145 06/21/2016   TRIG 51 06/21/2016   HDL 44 06/21/2016   CHOLHDL 3.3 06/21/2016   VLDL 10 06/21/2016   LDLCALC 91 06/21/2016   LDLCALC 70 08/22/2013    Physical Findings: AIMS:  , ,  ,  ,    CIWA:    COWS:     Musculoskeletal: Strength & Muscle Tone: weak, some generalized atrophy due to patient limiting her  mobility Gait & Station: unsteady, broad based Patient leans: Front  Psychiatric Specialty Exam: Physical Exam  ROS  Blood pressure 117/60, pulse 82, temperature 98.4 F (36.9 C), temperature source Oral, resp. rate 18, height 5' 2"  (1.575 m), weight 180 lb (81.6 kg), SpO2 94 %.Body mass index is 32.92 kg/m.  General Appearance: Casual  Eye Contact:  Fair  Speech:  Slow, soft, minimal  Volume:  Decreased  Mood:  Depressed, Dysphoric, Hopeless and Irritable  Affect:  Constricted and Depressed  Thought Process:  Coherent  Orientation:  Full (Time, Place, and Person)  Thought Content:  Rumination  Suicidal Thoughts:  Yes.  without intent/plan  Homicidal Thoughts:  No  Memory:  Immediate;   Fair Recent;   Fair Remote;   Fair  Judgement:  Impaired  Insight:  Lacking  Psychomotor Activity:  Psychomotor Retardation  Concentration:  Concentration: Fair and Attention Span: Fair  Recall:  [employer] of Knowledge:  Fair  Language:  Fair  Akathisia:  No  Handed:  Right  AIMS (if indicated):     Assets:  Communication Skills Housing  ADL's:  Impaired  Cognition:  WNL  Sleep:  Number of Hours: 7.3     Treatment Plan Summary: Daily contact with patient to assess and evaluate symptoms and progress in treatment and Medication management   Major Depressive Disorder Continue increased dose of Lexapro and current seroquel. Consult to ECT since patient not responding to multiple medications and decompensating.  Anxiety- Continue Buspar  Malnutrition- Nutrition consult done, recommendations being followed with ensure supplements.  Hypothyroidism- synthroid  Continue Aricept per primary care.  GERD- Pantoprazole  Labs-TSH wnl, lipid panel wnl, obtain UA  Disposition- After ECT treatment to outpatient Psychiatrist. Patient may need assisted living or nursing home based on response to ECT.    Elvin So, MD 06/22/2016, 10:09 AM

## 2016-06-22 NOTE — Progress Notes (Signed)
NUTRITION ASSESSMENT  Pt identified as at risk on the Malnutrition Screen Tool  INTERVENTION: 1. Educated patient on the importance of nutrition and encouraged intake of food and beverages. 2. Discussed weight goals. 3. Supplements: Ensure Enlive po BID, each supplement provides 350 kcal and 20 grams of protein  NUTRITION DIAGNOSIS: Unintentional weight loss related to sub-optimal intake as evidenced by pt report.   Goal: Pt to meet >/= 90% of their estimated nutrition needs.  Monitor:  PO intake  Assessment:  Cassidy [name] is a 76 yo female with a history of severe major depression. Has gotten progressively worse over the past 2 years. Daughter died last year - patient only feels like she should live for grandson now. Exhibits a 21#/10% insignificant wt loss over 1 year. Has only been drinking chocolate milk occasionally at home. Will try ensure. Has not eaten during stay really. Documented meal completion 0-10% Depression is primary cause of poor PO intake. Will not improve until that improves.  Height: Ht Readings from Last 1 Encounters:  06/20/16 5' 2"  (1.575 m)    Weight: Wt Readings from Last 1 Encounters:  06/20/16 180 lb (81.6 kg)    Weight Hx: Wt Readings from Last 10 Encounters:  06/20/16 180 lb (81.6 kg)  06/19/16 180 lb (81.6 kg)  06/19/16 178 lb 6.4 oz (80.9 kg)  09/20/15 190 lb 3.2 oz (86.3 kg)  06/21/15 198 lb 9.6 oz (90.1 kg)  05/24/15 201 lb 3.2 oz (91.3 kg)  04/25/15 203 lb 9.6 oz (92.4 kg)  12/08/14 200 lb (90.7 kg)  11/27/14 212 lb (96.2 kg)  10/18/14 212 lb (96.2 kg)    BMI:  Body mass index is 32.92 kg/m. Pt meets criteria for obese class I based on current BMI.  Estimated Nutritional Needs: Kcal: 25-30 kcal/kg Protein: > 1 gram protein/kg Fluid: 1 ml/kcal  Diet Order: Diet regular Room service appropriate? Yes; Fluid consistency: Thin Pt is also offered choice of unit snacks mid-morning and mid-afternoon.  Pt is eating as desired.   Lab  results and medications reviewed.   Cassidy Anis. Maille Halliwell, MS, RD LDN Inpatient Clinical Dietitian Pager [phone]

## 2016-06-22 NOTE — Progress Notes (Signed)
Urine sample obtained with results pending.

## 2016-06-23 ENCOUNTER — Other Ambulatory Visit: Payer: Self-pay | Admitting: Psychiatry

## 2016-06-23 ENCOUNTER — Inpatient Hospital Stay: Payer: Medicare Other | Admitting: Certified Registered Nurse Anesthetist

## 2016-06-23 DIAGNOSIS — F332 Major depressive disorder, recurrent severe without psychotic features: Principal | ICD-10-CM

## 2016-06-23 LAB — URINE CULTURE: SPECIAL REQUESTS: NORMAL

## 2016-06-23 LAB — GLUCOSE, CAPILLARY: Glucose-Capillary: 85 mg/dL (ref 65–99)

## 2016-06-23 MED ORDER — SUCCINYLCHOLINE CHLORIDE 20 MG/ML IJ SOLN
INTRAMUSCULAR | Status: AC
  Filled 2016-06-23: qty 1

## 2016-06-23 MED ORDER — METHOHEXITAL SODIUM 0.5 G IJ SOLR
INTRAMUSCULAR | Status: AC
  Filled 2016-06-23: qty 500

## 2016-06-23 MED ORDER — SODIUM CHLORIDE 0.9 % IV SOLN
INTRAVENOUS | Status: DC | PRN
  Administered 2016-06-23: 10:00:00 via INTRAVENOUS

## 2016-06-23 MED ORDER — FENTANYL CITRATE (PF) 100 MCG/2ML IJ SOLN: 25.0000 ug | INTRAMUSCULAR | Status: DC | PRN

## 2016-06-23 MED ORDER — METHOHEXITAL SODIUM 100 MG/10ML IV SOSY
PREFILLED_SYRINGE | INTRAVENOUS | Status: DC | PRN
  Administered 2016-06-23: 80 mg via INTRAVENOUS

## 2016-06-23 MED ORDER — SODIUM CHLORIDE 0.9 % IV SOLN
500.0000 mL | Freq: Once | INTRAVENOUS | Status: AC
  Administered 2016-06-23: 500 mL via INTRAVENOUS

## 2016-06-23 MED ORDER — CIPROFLOXACIN HCL 250 MG PO TABS
250.0000 mg | ORAL_TABLET | Freq: Two times a day (BID) | ORAL | Status: AC
  Administered 2016-06-23 – 2016-06-27 (×10): 250 mg via ORAL
  Filled 2016-06-23 (×10): qty 1

## 2016-06-23 MED ORDER — ONDANSETRON HCL 4 MG/2ML IJ SOLN: 4.0000 mg | Freq: Once | INTRAMUSCULAR | Status: DC | PRN

## 2016-06-23 MED ORDER — SUCCINYLCHOLINE CHLORIDE 200 MG/10ML IV SOSY
PREFILLED_SYRINGE | INTRAVENOUS | Status: DC | PRN
  Administered 2016-06-23: 80 mg via INTRAVENOUS

## 2016-06-23 NOTE — Progress Notes (Signed)
[facility] MD Progress Note  06/23/2016 7:46 PM Cassidy [name]  MRN:  [id number] Subjective:Patient is a 76 yo woman with severe depression , decompensating over past few months with poor nutrition and not responsive to multiple medication changes, seen by outpatient psychiatrist at [facility]. Admitted for stabilization .  Patient reports being unhappy being in the hospital. Per her sister she has not showered in months.She was initially resistant but showered with help of tech. She has not been eating, consumption less than 10 % of her meals. She is compliant with medications. Minimal participation in groups. Continues to say that her grandson is the only one keeping her from dying.  Follow-up Monday the fifth. Patient had ECT right unilateral treatment this morning. Tolerated treatment without difficulty. No complaints of headache or nausea afterwards. Seen this afternoon the patient says she is feeling a little bit better but she is still withdrawn and in her bedroom. Claims she has eaten a little bit more. Denies active suicidal thinking.  Principal Problem: Severe recurrent major depression without psychotic features ([place]) Diagnosis:   Patient Active Problem List   Diagnosis Date Noted  . Severe recurrent major depression without psychotic features ([place]) [F33.2] 06/19/2016  . Hepatic encephalopathy ([place]) [K72.90] 08/13/2015  . Thrombocytopenia ([place]) [D69.6] 07/23/2015  . Amnesia [R41.3] 03/20/2015  . Major depression in remission ([place]) [F32.5] 03/12/2015  . Gonalgia [M25.569] 01/05/2015  . Absolute anemia [D64.9] 11/27/2014  . Arthritis [M19.90] 11/27/2014  . Acid reflux [K21.9] 11/27/2014  . Depression, major, recurrent, in partial remission ([place]) [F33.41] 11/27/2014  . Encounter for general adult medical examination without abnormal findings [Z00.00] 08/25/2014  . Chronic LBP [M54.5, G89.29] 08/15/2014  . 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 Infraspinatus  tenosynovitis [M65.819] 05/01/2014  . Other synovitis and tenosynovitis, right shoulder [M65.811] 05/01/2014  . Difficulty in walking [R26.2] 11/30/2013  . Extreme obesity ([place]) [E66.01] 11/30/2013  . Morbid obesity ([place]) [E66.01] 11/30/2013  . Arthritis, degenerative [M19.90] 10/01/2013  . Steatohepatitis [K75.81] 10/01/2013  . Orthostasis [I95.1] 10/01/2013  . Appendicular ataxia [R27.0] 09/26/2013  . Fall [W19.XXXA] 09/26/2013  . Dizziness [R42] 09/07/2013  . Osteoporosis with fracture [M80.00XA] 09/06/2013  . Clinical depression [F32.9] 03/25/2012  . Adult hypothyroidism [E03.9] 03/25/2012  . Esophageal stenosis [K22.2] 07/03/2009  . Back ache [M54.9] 03/08/2003  . Anxiety state [F41.1] 12/27/2002   Total Time spent with patient: 20 minutes  Past Psychiatric History: Patient has had prior psychiatric admissions here at our hospital before. Denies suicide attempts. Patient has been on multiple antidepressants without consistent benefit. She is currently on Lexapro and BuSpar and Seroquel  Past Medical History:  Past Medical History:  Diagnosis Date  . Anxiety   . Depression   . Fatty liver   . GERD (gastroesophageal reflux disease)   . Hypothyroidism   . Obesity   . Thyroid disease     Past Surgical History:  Procedure Laterality Date  . APPENDECTOMY    . ESOPHAGOGASTRODUODENOSCOPY (EGD) WITH PROPOFOL N/A 12/20/2014   Procedure: ESOPHAGOGASTRODUODENOSCOPY (EGD) WITH PROPOFOL;  Surgeon: Manya Silvas, MD;  Location: [facility] ENDOSCOPY;  Service: Endoscopy;  Laterality: N/A;  . SAVORY DILATION N/A 12/20/2014   Procedure: SAVORY DILATION;  Surgeon: Manya Silvas, MD;  Location: [facility] ENDOSCOPY;  Service: Endoscopy;  Laterality: N/A;  . TONSILLECTOMY     Family History:  Family History  Problem Relation Age of Onset  . Diabetes Mother   . COPD Mother   . Kidney disease Mother   .  Depression Mother   . Heart attack Father   . Aneurysm Father   . Anxiety disorder Sister    . Depression Sister   . Diabetes Sister   . Hypertension Sister   . Atrial fibrillation Brother   . Spinal muscular atrophy Brother   . Breast cancer Sister   . Bone cancer Sister   . Depression Sister   . Anxiety disorder Sister   . Breast cancer Sister   . Colon cancer Sister   . Depression Sister   . Diabetes Sister   . COPD Sister   . Depression Sister   . Anxiety disorder Sister   . Diabetes Brother   . Depression Brother    Family Psychiatric  History: see above Social History:  History  Alcohol Use No     History  Drug Use No    Social History   Social History  . Marital status: Widowed    Spouse name: N/A  . Number of children: N/A  . Years of education: N/A   Social History Main Topics  . Smoking status: Never Smoker  . Smokeless tobacco: Never Used  . Alcohol use No  . Drug use: No  . Sexual activity: Not Currently    Birth control/ protection: Post-menopausal   Other Topics Concern  . None   Social History Narrative  . None   Additional Social History:                      Sleep: Fair  Appetite:  Poor  Current Medications: Current Facility-Administered Medications  Medication Dose Route Frequency Provider Last Rate Last Dose  . acetaminophen (TYLENOL) tablet 650 mg  650 mg Oral Q6H PRN Gonzella Lex, MD      . alum & mag hydroxide-simeth (MAALOX/MYLANTA) 200-200-20 MG/5ML suspension 30 mL  30 mL Oral Q4H PRN Gonzella Lex, MD      . busPIRone (BUSPAR) tablet 15 mg  15 mg Oral BID Gonzella Lex, MD   15 mg at 06/23/16 1653  . celecoxib (CELEBREX) capsule 100 mg  100 mg Oral Daily Gonzella Lex, MD   100 mg at 06/23/16 1138  . ciprofloxacin (CIPRO) tablet 250 mg  250 mg Oral BID Gonzella Lex, MD   250 mg at 06/23/16 1139  . donepezil (ARICEPT) tablet 5 mg  5 mg Oral QHS Gonzella Lex, MD   5 mg at 06/22/16 2148  . escitalopram (LEXAPRO) tablet 10 mg  10 mg Oral QHS Gonzella Lex, MD   10 mg at 06/22/16 2148  . feeding  supplement (ENSURE ENLIVE) (ENSURE ENLIVE) liquid 237 mL  237 mL Oral BID BM Himabindu Ravi, MD   237 mL at 06/23/16 1140  . fentaNYL (SUBLIMAZE) injection 25 mcg  25 mcg Intravenous Q5 min PRN Molli Barrows, MD      . fluticasone [facility]) 50 MCG/ACT nasal spray 2 spray  2 spray Each Nare Daily Gonzella Lex, MD   2 spray at 06/23/16 1140  . levothyroxine (SYNTHROID, LEVOTHROID) tablet 75 mcg  75 mcg Oral QAC breakfast Gonzella Lex, MD   75 mcg at 06/23/16 [phone]  . magnesium hydroxide (MILK OF MAGNESIA) suspension 30 mL  30 mL Oral Daily PRN Gonzella Lex, MD      . ondansetron (ZOFRAN) injection 4 mg  4 mg Intravenous Once PRN Molli Barrows, MD      . pantoprazole (PROTONIX) EC tablet 40 mg  40 mg Oral QAC breakfast Gonzella Lex, MD   40 mg at 06/23/16 0650  . QUEtiapine (SEROQUEL) tablet 100 mg  100 mg Oral QHS Himabindu Ravi, MD   100 mg at 06/22/16 2149    Lab Results:  Results for orders placed or performed during the hospital encounter of 06/20/16 (from the past 48 hour(s))  Urinalysis, Complete w Microscopic     Status: Abnormal   Collection Time: 06/22/16  3:31 PM  Result Value Ref Range   Color, Urine YELLOW (A) YELLOW   APPearance CLEAR (A) CLEAR   Specific Gravity, Urine 1.006 1.005 - 1.030   pH 6.0 5.0 - 8.0   Glucose, UA NEGATIVE NEGATIVE mg/dL   Hgb urine dipstick SMALL (A) NEGATIVE   Bilirubin Urine NEGATIVE NEGATIVE   Ketones, ur NEGATIVE NEGATIVE mg/dL   Protein, ur NEGATIVE NEGATIVE mg/dL   Nitrite NEGATIVE NEGATIVE   Leukocytes, UA SMALL (A) NEGATIVE   RBC / HPF 0-5 0 - 5 RBC/hpf   WBC, UA 6-30 0 - 5 WBC/hpf   Bacteria, UA MANY (A) NONE SEEN   Squamous Epithelial / LPF 0-5 (A) NONE SEEN  Glucose, capillary     Status: None   Collection Time: 06/23/16  6:47 AM  Result Value Ref Range   Glucose-Capillary 85 65 - 99 mg/dL    Blood Alcohol level:  Lab Results  Component Value Date   ETH <5 38/46/6599    Metabolic Disorder Labs: Lab Results  Component  Value Date   HGBA1C 5.3 06/21/2016   MPG 105 06/21/2016   No results found for: PROLACTIN Lab Results  Component Value Date   CHOL 145 06/21/2016   TRIG 51 06/21/2016   HDL 44 06/21/2016   CHOLHDL 3.3 06/21/2016   VLDL 10 06/21/2016   LDLCALC 91 06/21/2016   LDLCALC 70 08/22/2013    Physical Findings: AIMS:  , ,  ,  ,    CIWA:    COWS:     Musculoskeletal: Strength & Muscle Tone: weak, some generalized atrophy due to patient limiting her mobility Gait & Station: unsteady, broad based Patient leans: Front  Psychiatric Specialty Exam: Physical Exam   ROS   Blood pressure 124/64, pulse 84, temperature 97.6 F (36.4 C), temperature source Oral, resp. rate 18, height 5' 2"  (1.575 m), weight 78.5 kg (173 lb), SpO2 94 %.Body mass index is 31.64 kg/m.  General Appearance: Casual  Eye Contact:  Fair  Speech:  Slow, soft, minimal  Volume:  Decreased  Mood:  Depressed, Dysphoric, Hopeless and Irritable  Affect:  Constricted and Depressed  Thought Process:  Coherent  Orientation:  Full (Time, Place, and Person)  Thought Content:  Rumination  Suicidal Thoughts:  Yes.  without intent/plan  Homicidal Thoughts:  No  Memory:  Immediate;   Fair Recent;   Fair Remote;   Fair  Judgement:  Impaired  Insight:  Lacking  Psychomotor Activity:  Psychomotor Retardation  Concentration:  Concentration: Fair and Attention Span: Fair  Recall:  [employer] of Knowledge:  Fair  Language:  Fair  Akathisia:  No  Handed:  Right  AIMS (if indicated):     Assets:  Communication Skills Housing  ADL's:  Impaired  Cognition:  WNL  Sleep:  Number of Hours: 6.5     Treatment Plan Summary: Daily contact with patient to assess and evaluate symptoms and progress in treatment and Medication management   Major Depressive Disorder Continue increased dose of Lexapro and current seroquel. Consult  to ECT since patient not responding to multiple medications and decompensating.  Anxiety- Continue  Buspar  Malnutrition- Nutrition consult done, recommendations being followed with ensure supplements.  Hypothyroidism- synthroid  Continue Aricept per primary care.  GERD- Pantoprazole  Labs-TSH wnl, lipid panel wnl, obtain UA  Disposition- After ECT treatment to outpatient Psychiatrist. Patient may need assisted living or nursing home based on response to ECT.  Patient will continue 3 times a week ECT with next treatment scheduled for Wednesday. I would anticipate her being in the hospital at least through the middle part to end of the week although if she continues to tolerate treatment I would like to extend ECT for as long as it can be beneficial. No change to medication for today. Supportive counseling completed. Plan reviewed with social work and nursing.    Alethia Berthold, MD 06/23/2016, 7:46 PM

## 2016-06-23 NOTE — Progress Notes (Signed)
D: Patient denies SI/HI/AVH. Patient rates hopelessness as 6,  depression as 6, and anxiety as 6.  Patient affect and mood are depressed.  Patient states, "I just want to go home.  I hope they'll let me go home tomorrow.  Patient did NOT attend evening group. Patient visible on the milieu. No distress noted. A: Support and encouragement offered. Scheduled medications given to pt. Q 15 min checks continued for patient safety. R: Patient receptive. Patient remains safe on the unit.

## 2016-06-23 NOTE — Anesthesia Preprocedure Evaluation (Signed)
Anesthesia Evaluation  Patient identified by MRN, date of birth, ID band Patient awake    Reviewed: Allergy & Precautions, H&P , NPO status , Patient's Chart, lab work & pertinent test results, reviewed documented beta blocker date and time   Airway Mallampati: II   Neck ROM: full    Dental  (+) Poor Dentition   Pulmonary neg pulmonary ROS,    Pulmonary exam normal        Cardiovascular negative cardio ROS Normal cardiovascular exam Rhythm:regular Rate:Normal     Neuro/Psych PSYCHIATRIC DISORDERS negative neurological ROS  negative psych ROS   GI/Hepatic negative GI ROS, Neg liver ROS, GERD  Medicated,(+) Hepatitis -  Endo/Other  negative endocrine ROSHypothyroidism   Renal/GU negative Renal ROS  negative genitourinary   Musculoskeletal   Abdominal   Peds  Hematology negative hematology ROS (+) anemia ,   Anesthesia Other Findings Past Medical History: No date: Anxiety No date: Depression No date: Fatty liver No date: GERD (gastroesophageal reflux disease) No date: Hypothyroidism No date: Obesity No date: Thyroid disease Past Surgical History: No date: APPENDECTOMY 12/20/2014: ESOPHAGOGASTRODUODENOSCOPY (EGD) WITH PROPOFOL N/A     Comment: Procedure: ESOPHAGOGASTRODUODENOSCOPY (EGD)               WITH PROPOFOL;  Surgeon: Manya Silvas, MD;               Location: [place];  Service: Endoscopy;               Laterality: N/A; 12/20/2014: SAVORY DILATION N/A     Comment: Procedure: SAVORY DILATION;  Surgeon: Manya Silvas, MD;  Location: [facility] ENDOSCOPY;                Service: Endoscopy;  Laterality: N/A; No date: TONSILLECTOMY BMI    Body Mass Index:  31.64 kg/m     Reproductive/Obstetrics negative OB ROS                             Anesthesia Physical Anesthesia Plan  ASA: III  Anesthesia Plan: General   Post-op Pain Management:    Induction:    Airway Management Planned:   Additional Equipment:   Intra-op Plan:   Post-operative Plan:   Informed Consent: I have reviewed the patients History and Physical, chart, labs and discussed the procedure including the risks, benefits and alternatives for the proposed anesthesia with the patient or authorized representative who has indicated his/her understanding and acceptance.   Dental Advisory Given  Plan Discussed with: CRNA  Anesthesia Plan Comments:         Anesthesia Quick Evaluation

## 2016-06-23 NOTE — BHH Group Notes (Signed)
[place] Group Notes:  (Nursing/MHT/Case Management/Adjunct)  Date:  06/23/2016  Time:  11:08 PM  Type of Therapy:  Psychoeducational Skills  Participation Level:  Did Not Attend Nehemiah Settle 06/23/2016, 11:08 PM

## 2016-06-23 NOTE — Plan of Care (Signed)
Problem: Activity: Goal: Risk for activity intolerance will decrease Outcome: Progressing Encouraged to ambulate, ask for help when needed.

## 2016-06-23 NOTE — H&P (Signed)
Cassidy [name] is an 76 y.o. female.   Chief Complaint: Patient with major depression admitted to the hospital HPI: Begin ECT treatment first treatment today  Past Medical History:  Diagnosis Date  . Anxiety   . Depression   . Fatty liver   . GERD (gastroesophageal reflux disease)   . Hypothyroidism   . Obesity   . Thyroid disease     Past Surgical History:  Procedure Laterality Date  . APPENDECTOMY    . ESOPHAGOGASTRODUODENOSCOPY (EGD) WITH PROPOFOL N/A 12/20/2014   Procedure: ESOPHAGOGASTRODUODENOSCOPY (EGD) WITH PROPOFOL;  Surgeon: Manya Silvas, MD;  Location: [facility] ENDOSCOPY;  Service: Endoscopy;  Laterality: N/A;  . SAVORY DILATION N/A 12/20/2014   Procedure: SAVORY DILATION;  Surgeon: Manya Silvas, MD;  Location: [facility] ENDOSCOPY;  Service: Endoscopy;  Laterality: N/A;  . TONSILLECTOMY      Family History  Problem Relation Age of Onset  . Diabetes Mother   . COPD Mother   . Kidney disease Mother   . Depression Mother   . Heart attack Father   . Aneurysm Father   . Anxiety disorder Sister   . Depression Sister   . Diabetes Sister   . Hypertension Sister   . Atrial fibrillation Brother   . Spinal muscular atrophy Brother   . Breast cancer Sister   . Bone cancer Sister   . Depression Sister   . Anxiety disorder Sister   . Breast cancer Sister   . Colon cancer Sister   . Depression Sister   . Diabetes Sister   . COPD Sister   . Depression Sister   . Anxiety disorder Sister   . Diabetes Brother   . Depression Brother    Social History:  reports that she has never smoked. She has never used smokeless tobacco. She reports that she does not drink alcohol or use drugs.  Allergies:  Allergies  Allergen Reactions  . Sulfa Antibiotics Shortness Of Breath    Medications Prior to Admission  Medication Sig Dispense Refill  . busPIRone (BUSPAR) 15 MG tablet Take 1 tablet (15 mg total) by mouth 2 (two) times daily. 180 tablet 0  . celecoxib (CELEBREX) 100 MG  capsule TAKE ONE CAPSULE BY MOUTH TWICE A DAY    . donepezil (ARICEPT) 5 MG tablet Take 5 mg by mouth at bedtime.     Marland Kitchen escitalopram (LEXAPRO) 5 MG tablet Take 1 tablet (5 mg total) by mouth at bedtime. (Patient taking differently: Take 10 mg by mouth at bedtime. ) 30 tablet 3  . fluticasone (FLONASE) 50 MCG/ACT nasal spray Place 1 spray into both nostrils daily.    . hydrOXYzine (VISTARIL) 25 MG capsule Take 1 capsule (25 mg total) by mouth 3 (three) times daily as needed. 30 capsule 0  . KLOR-CON 10 10 MEQ tablet Take 10 mEq by mouth daily.    Marland Kitchen levothyroxine (SYNTHROID, LEVOTHROID) 75 MCG tablet Take 75 mcg by mouth daily before breakfast.    . omeprazole (PRILOSEC) 20 MG capsule Take 20 mg by mouth 2 (two) times daily.    . QUEtiapine (SEROQUEL) 100 MG tablet Take 1 tablet (100 mg total) by mouth at bedtime. (Patient taking differently: Take 400 mg by mouth at bedtime. ) 30 tablet 3  . traZODone (DESYREL) 100 MG tablet Take 1 tablet (100 mg total) by mouth at bedtime. 30 tablet 3    Results for orders placed or performed during the hospital encounter of 06/20/16 (from the past 48 hour(s))  Urinalysis,  Complete w Microscopic     Status: Abnormal   Collection Time: 06/22/16  3:31 PM  Result Value Ref Range   Color, Urine YELLOW (A) YELLOW   APPearance CLEAR (A) CLEAR   Specific Gravity, Urine 1.006 1.005 - 1.030   pH 6.0 5.0 - 8.0   Glucose, UA NEGATIVE NEGATIVE mg/dL   Hgb urine dipstick SMALL (A) NEGATIVE   Bilirubin Urine NEGATIVE NEGATIVE   Ketones, ur NEGATIVE NEGATIVE mg/dL   Protein, ur NEGATIVE NEGATIVE mg/dL   Nitrite NEGATIVE NEGATIVE   Leukocytes, UA SMALL (A) NEGATIVE   RBC / HPF 0-5 0 - 5 RBC/hpf   WBC, UA 6-30 0 - 5 WBC/hpf   Bacteria, UA MANY (A) NONE SEEN   Squamous Epithelial / LPF 0-5 (A) NONE SEEN  Glucose, capillary     Status: None   Collection Time: 06/23/16  6:47 AM  Result Value Ref Range   Glucose-Capillary 85 65 - 99 mg/dL   No results found.  Review  of Systems  Constitutional: Negative.   HENT: Negative.   Eyes: Negative.   Respiratory: Negative.   Cardiovascular: Negative.   Gastrointestinal: Negative.   Musculoskeletal: Negative.   Skin: Negative.   Neurological: Negative.   Psychiatric/Behavioral: Positive for depression and suicidal ideas. Negative for hallucinations, memory loss and substance abuse. The patient has insomnia. The patient is not nervous/anxious.     Blood pressure (!) 149/75, pulse 81, temperature 97.9 F (36.6 C), temperature source Oral, resp. rate 18, height 5' 2"  (1.575 m), weight 78.5 kg (173 lb), SpO2 98 %. Physical Exam  Nursing note and vitals reviewed. Constitutional: She appears well-developed and well-nourished.  HENT:  Head: Normocephalic and atraumatic.  Eyes: Conjunctivae are normal. Pupils are equal, round, and reactive to light.  Neck: Normal range of motion.  Cardiovascular: Regular rhythm and normal heart sounds.   Respiratory: Effort normal. No respiratory distress.  GI: Soft.  Musculoskeletal: Normal range of motion.  Neurological: She is alert.  Skin: Skin is warm and dry.  Psychiatric: Judgment normal. Her speech is delayed. She is slowed. Cognition and memory are normal. She expresses suicidal ideation. She expresses no suicidal plans.     Assessment/Plan Follow-up Wednesday and Friday for presumably inpatient continued reevaluation.  Alethia Berthold, MD 06/23/2016, 10:46 AM

## 2016-06-23 NOTE — BHH Group Notes (Signed)
[place] LCSW Group Therapy Note  Date/Time: 06/23/16, 1300  Type of Therapy and Topic:  Group Therapy:  Overcoming Obstacles  Participation Level:  PT did not attend group.  Description of Group:    In this group patients will be encouraged to explore what they see as obstacles to their own wellness and recovery. They will be guided to discuss their thoughts, feelings, and behaviors related to these obstacles. The group will process together ways to cope with barriers, with attention given to specific choices patients can make. Each patient will be challenged to identify changes they are motivated to make in order to overcome their obstacles. This group will be process-oriented, with patients participating in exploration of their own experiences as well as giving and receiving support and challenge from other group members.  Therapeutic Goals: 1. Patient will identify personal and current obstacles as they relate to admission. 2. Patient will identify barriers that currently interfere with their wellness or overcoming obstacles.  3. Patient will identify feelings, thought process and behaviors related to these barriers. 4. Patient will identify two changes they are willing to make to overcome these obstacles:    Summary of Patient Progress      Therapeutic Modalities:   Cognitive Behavioral Therapy Solution Focused Therapy Motivational Interviewing Relapse Prevention Therapy  Lurline Idol, LCSW

## 2016-06-23 NOTE — Procedures (Signed)
ECT SERVICES Physician's Interval Evaluation & Treatment Note  Patient Identification: Cassidy [name] MRN:  [id number] Date of Evaluation:  06/23/2016 TX #: 1  MADRS: 34  MMSE: 28  P.E. Findings:  Lungs clear heart normal no acute injury vitals stable  Psychiatric Interval Note:  Depressed withdrawn and not eating  Subjective:  Patient is a 76 y.o. female seen for evaluation for Electroconvulsive Therapy. Mood down and flat  Treatment Summary:   [x]   Right Unilateral             []  Bilateral   % Energy : 0.3 ms 80%   Impedance: 1680 ohms  Seizure Energy Index: 8105 V squared  Postictal Suppression Index: 69%  Seizure Concordance Index: 91%  Medications  Pre Shock: Brevital 80 mg succinylcholine 80 mg  Post Shock:    Seizure Duration: 28 seconds by EMG 60 seconds by EEG   Comments: Follow-up Wednesday same parameters   Lungs:  [x]   Clear to auscultation               []  Other:   Heart:    [x]   Regular rhythm             []  irregular rhythm    [x]   Previous H&P reviewed, patient examined and there are NO CHANGES                 []   Previous H&P reviewed, patient examined and there are changes noted.   Alethia Berthold, MD 3/5/201810:44 AM

## 2016-06-23 NOTE — Transfer of Care (Signed)
Immediate Anesthesia Transfer of Care Note  Patient: Cassidy [name]  Procedure(s) Performed: * No procedures listed *  Patient Location: PACU  Anesthesia Type:General  Level of Consciousness: sedated  Airway & Oxygen Therapy: Patient Spontanous Breathing and Patient connected to face mask oxygen  Post-op Assessment: Report given to RN and Post -op Vital signs reviewed and stable  Post vital signs: Reviewed and stable  Last Vitals:  Vitals:   06/23/16 0911 06/23/16 1050  BP: (!) 149/75   Pulse: 81 99  Resp:    Temp: 36.6 C 36.4 C    Last Pain:  Vitals:   06/23/16 0911  TempSrc: Oral  PainSc:          Complications: No apparent anesthesia complications

## 2016-06-23 NOTE — Anesthesia Post-op Follow-up Note (Cosign Needed)
Anesthesia QCDR form completed.        

## 2016-06-23 NOTE — BHH Group Notes (Signed)
[place] Group Notes:  (Nursing/MHT/Case Management/Adjunct)  Date:  06/23/2016  Time:  5:51 PM  Type of Therapy:  Psychoeducational Skills  Participation Level:  Did Not Attend  Charise Killian 06/23/2016, 5:51 PM

## 2016-06-23 NOTE — Progress Notes (Signed)
76 year old female admitted for major depression on 06/20/16. Received today in bed, pleasant, disoriented to time. Assist needed for ambulation, uses wheelchair but can walk with 1 person assist, requires significant encouragement to eat. Has occasional incontinence but can tell when she needs to get cleaned up. Had ECT this morning, No apparent change in orientation/affect/mood on 2 hour post reassessment. Compliant with medications. Family has been calling to stay up to date on patient condition. Encouraged to participate in groups but refused. Will continue to monitor for safety and reorient as necessary.

## 2016-06-23 NOTE — Tx Team (Signed)
Interdisciplinary Treatment and Diagnostic Plan Update  06/23/2016 Time of Session: [place] MRN: [id number]  Principal Diagnosis: <principal problem not specified>  Secondary Diagnoses: Active Problems:   Severe recurrent major depression without psychotic features (HCC)   Current Medications:  Current Facility-Administered Medications  Medication Dose Route Frequency Provider Last Rate Last Dose  . acetaminophen (TYLENOL) tablet 650 mg  650 mg Oral Q6H PRN Gonzella Lex, MD      . alum & mag hydroxide-simeth (MAALOX/MYLANTA) 200-200-20 MG/5ML suspension 30 mL  30 mL Oral Q4H PRN Gonzella Lex, MD      . busPIRone (BUSPAR) tablet 15 mg  15 mg Oral BID Gonzella Lex, MD   15 mg at 06/22/16 1619  . celecoxib (CELEBREX) capsule 100 mg  100 mg Oral Daily Gonzella Lex, MD   100 mg at 06/22/16 0801  . ciprofloxacin (CIPRO) tablet 250 mg  250 mg Oral BID Gonzella Lex, MD      . donepezil (ARICEPT) tablet 5 mg  5 mg Oral QHS Gonzella Lex, MD   5 mg at 06/22/16 2148  . escitalopram (LEXAPRO) tablet 10 mg  10 mg Oral QHS Gonzella Lex, MD   10 mg at 06/22/16 2148  . feeding supplement (ENSURE ENLIVE) (ENSURE ENLIVE) liquid 237 mL  237 mL Oral BID BM Himabindu Ravi, MD   237 mL at 06/22/16 1400  . fentaNYL (SUBLIMAZE) injection 25 mcg  25 mcg Intravenous Q5 min PRN Molli Barrows, MD      . fluticasone [facility]) 50 MCG/ACT nasal spray 2 spray  2 spray Each Nare Daily Gonzella Lex, MD   2 spray at 06/22/16 0801  . levothyroxine (SYNTHROID, LEVOTHROID) tablet 75 mcg  75 mcg Oral QAC breakfast Gonzella Lex, MD   75 mcg at 06/23/16 [phone]  . magnesium hydroxide (MILK OF MAGNESIA) suspension 30 mL  30 mL Oral Daily PRN Gonzella Lex, MD      . ondansetron (ZOFRAN) injection 4 mg  4 mg Intravenous Once PRN Molli Barrows, MD      . pantoprazole (PROTONIX) EC tablet 40 mg  40 mg Oral QAC breakfast Gonzella Lex, MD   40 mg at 06/23/16 0650  . QUEtiapine (SEROQUEL) tablet 100 mg  100  mg Oral QHS Himabindu Ravi, MD   100 mg at 06/22/16 2149   PTA Medications: Prescriptions Prior to Admission  Medication Sig Dispense Refill Last Dose  . busPIRone (BUSPAR) 15 MG tablet Take 1 tablet (15 mg total) by mouth 2 (two) times daily. 180 tablet 0 06/20/2016 at Unknown time  . celecoxib (CELEBREX) 100 MG capsule TAKE ONE CAPSULE BY MOUTH TWICE A DAY   06/20/2016 at Unknown time  . donepezil (ARICEPT) 5 MG tablet Take 5 mg by mouth at bedtime.    06/20/2016 at Unknown time  . escitalopram (LEXAPRO) 5 MG tablet Take 1 tablet (5 mg total) by mouth at bedtime. (Patient taking differently: Take 10 mg by mouth at bedtime. ) 30 tablet 3 06/20/2016 at Unknown time  . fluticasone (FLONASE) 50 MCG/ACT nasal spray Place 1 spray into both nostrils daily.   06/20/2016 at Unknown time  . hydrOXYzine (VISTARIL) 25 MG capsule Take 1 capsule (25 mg total) by mouth 3 (three) times daily as needed. 30 capsule 0 06/20/2016 at Unknown time  . KLOR-CON 10 10 MEQ tablet Take 10 mEq by mouth daily.   06/20/2016 at Unknown time  . levothyroxine (SYNTHROID,  LEVOTHROID) 75 MCG tablet Take 75 mcg by mouth daily before breakfast.   06/23/2016 at Unknown time  . omeprazole (PRILOSEC) 20 MG capsule Take 20 mg by mouth 2 (two) times daily.   06/20/2016 at Unknown time  . QUEtiapine (SEROQUEL) 100 MG tablet Take 1 tablet (100 mg total) by mouth at bedtime. (Patient taking differently: Take 400 mg by mouth at bedtime. ) 30 tablet 3 06/20/2016 at Unknown time  . traZODone (DESYREL) 100 MG tablet Take 1 tablet (100 mg total) by mouth at bedtime. 30 tablet 3 Past Week at Unknown time    Patient Stressors: Health problems Loss of daughter Medication change or noncompliance  Patient Strengths: Technical sales engineer for treatment/growth Supportive family/friends  Treatment Modalities: Medication Management, Group therapy, Case management,  1 to 1 session with clinician, Psychoeducation, Recreational therapy.   Physician  Treatment Plan for Primary Diagnosis: <principal problem not specified> Long Term Goal(s): Improvement in symptoms so as ready for discharge Improvement in symptoms so as ready for discharge   Short Term Goals: Ability to identify changes in lifestyle to reduce recurrence of condition will improve Ability to verbalize feelings will improve Ability to disclose and discuss suicidal ideas Ability to demonstrate self-control will improve Ability to identify and develop effective coping behaviors will improve Ability to maintain clinical measurements within normal limits will improve Compliance with prescribed medications will improve Ability to identify changes in lifestyle to reduce recurrence of condition will improve Ability to verbalize feelings will improve Ability to disclose and discuss suicidal ideas Ability to demonstrate self-control will improve Ability to identify and develop effective coping behaviors will improve Ability to maintain clinical measurements within normal limits will improve Compliance with prescribed medications will improve  Medication Management: Evaluate patient's response, side effects, and tolerance of medication regimen.  Therapeutic Interventions: 1 to 1 sessions, Unit Group sessions and Medication administration.  Evaluation of Outcomes: Not Met  Physician Treatment Plan for Secondary Diagnosis: Active Problems:   Severe recurrent major depression without psychotic features ([place])  Long Term Goal(s): Improvement in symptoms so as ready for discharge Improvement in symptoms so as ready for discharge   Short Term Goals: Ability to identify changes in lifestyle to reduce recurrence of condition will improve Ability to verbalize feelings will improve Ability to disclose and discuss suicidal ideas Ability to demonstrate self-control will improve Ability to identify and develop effective coping behaviors will improve Ability to maintain clinical  measurements within normal limits will improve Compliance with prescribed medications will improve Ability to identify changes in lifestyle to reduce recurrence of condition will improve Ability to verbalize feelings will improve Ability to disclose and discuss suicidal ideas Ability to demonstrate self-control will improve Ability to identify and develop effective coping behaviors will improve Ability to maintain clinical measurements within normal limits will improve Compliance with prescribed medications will improve     Medication Management: Evaluate patient's response, side effects, and tolerance of medication regimen.  Therapeutic Interventions: 1 to 1 sessions, Unit Group sessions and Medication administration.  Evaluation of Outcomes: Not Met   RN Treatment Plan for Primary Diagnosis: <principal problem not specified> Long Term Goal(s): Knowledge of disease and therapeutic regimen to maintain health will improve  Short Term Goals: Ability to identify and develop effective coping behaviors will improve  Medication Management: RN will administer medications as ordered by provider, will assess and evaluate patient's response and provide education to patient for prescribed medication. RN will report any adverse and/or side effects to  prescribing provider.  Therapeutic Interventions: 1 on 1 counseling sessions, Psychoeducation, Medication administration, Evaluate responses to treatment, Monitor vital signs and CBGs as ordered, Perform/monitor CIWA, COWS, AIMS and Fall Risk screenings as ordered, Perform wound care treatments as ordered.  Evaluation of Outcomes: Not Met   LCSW Treatment Plan for Primary Diagnosis: <principal problem not specified> Long Term Goal(s): Safe transition to appropriate next level of care at discharge, Engage patient in therapeutic group addressing interpersonal concerns.  Short Term Goals: Engage patient in aftercare planning with referrals and  resources, Increase social support and Increase skills for wellness and recovery  Therapeutic Interventions: Assess for all discharge needs, 1 to 1 time with Social worker, Explore available resources and support systems, Assess for adequacy in community support network, Educate family and significant other(s) on suicide prevention, Complete Psychosocial Assessment, Interpersonal group therapy.  Evaluation of Outcomes: Not Met   Progress in Treatment: Attending groups: Yes. Pt has attended only one group. Participating in groups: Yes. Taking medication as prescribed: Yes. Toleration medication: Yes. Family/Significant other contact made: Yes, individual(s) contacted:  sister Patient understands diagnosis: Yes. Discussing patient identified problems/goals with staff: Yes. Medical problems stabilized or resolved: Yes. Denies suicidal/homicidal ideation: Yes. Issues/concerns per patient self-inventory: No. Other: none  New problem(s) identified: No, Describe:  none  New Short Term/Long Term Goal(s): none  Discharge Plan or Barriers: CSW assessing for appropriate plan.  Reason for Continuation of Hospitalization: Depression Other; describe ECT treatments  Estimated Length of Stay:  Attendees: Patient: 06/23/2016 11:31 AM  Physician: Dr Weber Cooks, MD 06/23/2016 11:31 AM  Nursing: Gavin Pound, RN 06/23/2016 11:31 AM  RN Care Manager: 06/23/2016 11:31 AM  Social Worker: Lurline Idol, LCSW 06/23/2016 11:31 AM  Recreational Therapist:  06/23/2016 11:31 AM  Other:  06/23/2016 11:31 AM  Other:  06/23/2016 11:31 AM  Other: 06/23/2016 11:31 AM    Scribe for Treatment Team: Joanne Chars, [place] 06/23/2016 11:31 AM

## 2016-06-24 NOTE — [facility] Group Notes (Addendum)
[place] LCSW Group Therapy Note  Date/Time: 06/24/16, 1300  Type of Therapy/Topic:  Group Therapy:  Feelings about Diagnosis  Participation Level:  Did Not Attend   Mood:   Description of Group:    This group will allow patients to explore their thoughts and feelings about diagnoses they have received. Patients will be guided to explore their level of understanding and acceptance of these diagnoses. Facilitator will encourage patients to process their thoughts and feelings about the reactions of others to their diagnosis, and will guide patients in identifying ways to discuss their diagnosis with significant others in their lives. This group will be process-oriented, with patients participating in exploration of their own experiences as well as giving and receiving support and challenge from other group members.   Therapeutic Goals: 1. Patient will demonstrate understanding of diagnosis as evidence by identifying two or more symptoms of the disorder:  2. Patient will be able to express two feelings regarding the diagnosis 3. Patient will demonstrate ability to communicate their needs through discussion and/or role plays  Summary of Patient Progress:        Therapeutic Modalities:   Cognitive Behavioral Therapy Brief Therapy Feelings Identification   Lurline Idol, LCSW

## 2016-06-24 NOTE — Progress Notes (Signed)
[facility] MD Progress Note  06/24/2016 6:14 PM Cassidy [name]  MRN:  [id number] Subjective:Patient is a 76 yo woman with severe depression , decompensating over past few months with poor nutrition and not responsive to multiple medication changes, seen by outpatient psychiatrist at [facility]. Admitted for stabilization .  Patient reports being unhappy being in the hospital. Per her sister she has not showered in months.She was initially resistant but showered with help of tech. She has not been eating, consumption less than 10 % of her meals. She is compliant with medications. Minimal participation in groups. Continues to say that her grandson is the only one keeping her from dying.  Follow-up for Tuesday the sixth. Chart reviewed patient seen. Patient tells me she is feeling "about the same". She does say that she has had something to eat today but admits that she has not gone to any groups. She says that she is still feeling depressed and sad. Once again asks to go home but is not able to give any assurance that her behavior will be different when she goes home. Has not engaged in any dangerous behavior. Does seem to be taking care of her basic hygiene.  Principal Problem: Severe recurrent major depression without psychotic features ([place]) Diagnosis:   Patient Active Problem List   Diagnosis Date Noted  . Severe recurrent major depression without psychotic features ([place]) [F33.2] 06/19/2016  . Hepatic encephalopathy ([place]) [K72.90] 08/13/2015  . Thrombocytopenia ([place]) [D69.6] 07/23/2015  . Amnesia [R41.3] 03/20/2015  . Major depression in remission ([place]) [F32.5] 03/12/2015  . Gonalgia [M25.569] 01/05/2015  . Absolute anemia [D64.9] 11/27/2014  . Arthritis [M19.90] 11/27/2014  . Acid reflux [K21.9] 11/27/2014  . Depression, major, recurrent, in partial remission ([place]) [F33.41] 11/27/2014  . Encounter for general adult medical examination without abnormal findings [Z00.00] 08/25/2014  . Chronic LBP [M54.5,  G89.29] 08/15/2014  . 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 Infraspinatus tenosynovitis [M65.819] 05/01/2014  . Other synovitis and tenosynovitis, right shoulder [M65.811] 05/01/2014  . Difficulty in walking [R26.2] 11/30/2013  . Extreme obesity ([place]) [E66.01] 11/30/2013  . Morbid obesity ([place]) [E66.01] 11/30/2013  . Arthritis, degenerative [M19.90] 10/01/2013  . Steatohepatitis [K75.81] 10/01/2013  . Orthostasis [I95.1] 10/01/2013  . Appendicular ataxia [R27.0] 09/26/2013  . Fall [W19.XXXA] 09/26/2013  . Dizziness [R42] 09/07/2013  . Osteoporosis with fracture [M80.00XA] 09/06/2013  . Clinical depression [F32.9] 03/25/2012  . Adult hypothyroidism [E03.9] 03/25/2012  . Esophageal stenosis [K22.2] 07/03/2009  . Back ache [M54.9] 03/08/2003  . Anxiety state [F41.1] 12/27/2002   Total Time spent with patient: 20 minutes  Past Psychiatric History: Patient has had prior psychiatric admissions here at our hospital before. Denies suicide attempts. Patient has been on multiple antidepressants without consistent benefit. She is currently on Lexapro and BuSpar and Seroquel  Past Medical History:  Past Medical History:  Diagnosis Date  . Anxiety   . Depression   . Fatty liver   . GERD (gastroesophageal reflux disease)   . Hypothyroidism   . Obesity   . Thyroid disease     Past Surgical History:  Procedure Laterality Date  . APPENDECTOMY    . ESOPHAGOGASTRODUODENOSCOPY (EGD) WITH PROPOFOL N/A 12/20/2014   Procedure: ESOPHAGOGASTRODUODENOSCOPY (EGD) WITH PROPOFOL;  Surgeon: Manya Silvas, MD;  Location: [facility] ENDOSCOPY;  Service: Endoscopy;  Laterality: N/A;  . SAVORY DILATION N/A 12/20/2014   Procedure: SAVORY DILATION;  Surgeon: Manya Silvas, MD;  Location: [facility] ENDOSCOPY;  Service: Endoscopy;  Laterality: N/A;  .  TONSILLECTOMY     Family History:  Family History  Problem Relation Age of Onset  . Diabetes Mother   . COPD Mother   . Kidney  disease Mother   . Depression Mother   . Heart attack Father   . Aneurysm Father   . Anxiety disorder Sister   . Depression Sister   . Diabetes Sister   . Hypertension Sister   . Atrial fibrillation Brother   . Spinal muscular atrophy Brother   . Breast cancer Sister   . Bone cancer Sister   . Depression Sister   . Anxiety disorder Sister   . Breast cancer Sister   . Colon cancer Sister   . Depression Sister   . Diabetes Sister   . COPD Sister   . Depression Sister   . Anxiety disorder Sister   . Diabetes Brother   . Depression Brother    Family Psychiatric  History: see above Social History:  History  Alcohol Use No     History  Drug Use No    Social History   Social History  . Marital status: Widowed    Spouse name: N/A  . Number of children: N/A  . Years of education: N/A   Social History Main Topics  . Smoking status: Never Smoker  . Smokeless tobacco: Never Used  . Alcohol use No  . Drug use: No  . Sexual activity: Not Currently    Birth control/ protection: Post-menopausal   Other Topics Concern  . None   Social History Narrative  . None   Additional Social History:                      Sleep: Fair  Appetite:  Poor  Current Medications: Current Facility-Administered Medications  Medication Dose Route Frequency Provider Last Rate Last Dose  . acetaminophen (TYLENOL) tablet 650 mg  650 mg Oral Q6H PRN Gonzella Lex, MD      . alum & mag hydroxide-simeth (MAALOX/MYLANTA) 200-200-20 MG/5ML suspension 30 mL  30 mL Oral Q4H PRN Gonzella Lex, MD      . busPIRone (BUSPAR) tablet 15 mg  15 mg Oral BID Gonzella Lex, MD   15 mg at 06/24/16 1654  . celecoxib (CELEBREX) capsule 100 mg  100 mg Oral Daily Gonzella Lex, MD   100 mg at 06/24/16 0805  . ciprofloxacin (CIPRO) tablet 250 mg  250 mg Oral BID Gonzella Lex, MD   250 mg at 06/24/16 3154  . donepezil (ARICEPT) tablet 5 mg  5 mg Oral QHS Gonzella Lex, MD   5 mg at 06/23/16 2148  .  escitalopram (LEXAPRO) tablet 10 mg  10 mg Oral QHS Gonzella Lex, MD   10 mg at 06/23/16 2147  . feeding supplement (ENSURE ENLIVE) (ENSURE ENLIVE) liquid 237 mL  237 mL Oral BID BM Himabindu Ravi, MD   237 mL at 06/23/16 1140  . fentaNYL (SUBLIMAZE) injection 25 mcg  25 mcg Intravenous Q5 min PRN Molli Barrows, MD      . fluticasone [facility]) 50 MCG/ACT nasal spray 2 spray  2 spray Each Nare Daily Gonzella Lex, MD   2 spray at 06/23/16 1140  . levothyroxine (SYNTHROID, LEVOTHROID) tablet 75 mcg  75 mcg Oral QAC breakfast Gonzella Lex, MD   75 mcg at 06/24/16 0805  . magnesium hydroxide (MILK OF MAGNESIA) suspension 30 mL  30 mL Oral Daily PRN Gonzella Lex, MD      .  ondansetron (ZOFRAN) injection 4 mg  4 mg Intravenous Once PRN Molli Barrows, MD      . pantoprazole (PROTONIX) EC tablet 40 mg  40 mg Oral QAC breakfast Gonzella Lex, MD   40 mg at 06/24/16 0807  . QUEtiapine (SEROQUEL) tablet 100 mg  100 mg Oral QHS Himabindu Ravi, MD   100 mg at 06/23/16 2148    Lab Results:  Results for orders placed or performed during the hospital encounter of 06/20/16 (from the past 48 hour(s))  Glucose, capillary     Status: None   Collection Time: 06/23/16  6:47 AM  Result Value Ref Range   Glucose-Capillary 85 65 - 99 mg/dL    Blood Alcohol level:  Lab Results  Component Value Date   ETH <5 65/78/4696    Metabolic Disorder Labs: Lab Results  Component Value Date   HGBA1C 5.3 06/21/2016   MPG 105 06/21/2016   No results found for: PROLACTIN Lab Results  Component Value Date   CHOL 145 06/21/2016   TRIG 51 06/21/2016   HDL 44 06/21/2016   CHOLHDL 3.3 06/21/2016   VLDL 10 06/21/2016   LDLCALC 91 06/21/2016   LDLCALC 70 08/22/2013    Physical Findings: AIMS:  , ,  ,  ,    CIWA:    COWS:     Musculoskeletal: Strength & Muscle Tone: weak, some generalized atrophy due to patient limiting her mobility Gait & Station: unsteady, broad based Patient leans: Front  Psychiatric  Specialty Exam: Physical Exam   ROS   Blood pressure 128/62, pulse 78, temperature 98 F (36.7 C), temperature source Oral, resp. rate 16, height 5' 2"  (1.575 m), weight 78.5 kg (173 lb), SpO2 97 %.Body mass index is 31.64 kg/m.  General Appearance: Casual  Eye Contact:  Fair  Speech:  Slow, soft, minimal  Volume:  Decreased  Mood:  Depressed, Dysphoric, Hopeless and Irritable  Affect:  Constricted and Depressed  Thought Process:  Coherent  Orientation:  Full (Time, Place, and Person)  Thought Content:  Rumination  Suicidal Thoughts:  Yes.  without intent/plan  Homicidal Thoughts:  No  Memory:  Immediate;   Fair Recent;   Fair Remote;   Fair  Judgement:  Impaired  Insight:  Lacking  Psychomotor Activity:  Psychomotor Retardation  Concentration:  Concentration: Fair and Attention Span: Fair  Recall:  [employer] of Knowledge:  Fair  Language:  Fair  Akathisia:  No  Handed:  Right  AIMS (if indicated):     Assets:  Communication Skills Housing  ADL's:  Impaired  Cognition:  WNL  Sleep:  Number of Hours: 7     Treatment Plan Summary: Daily contact with patient to assess and evaluate symptoms and progress in treatment and Medication management   Major Depressive Disorder Continue increased dose of Lexapro and current seroquel. Consult to ECT since patient not responding to multiple medications and decompensating.  Anxiety- Continue Buspar  Malnutrition- Nutrition consult done, recommendations being followed with ensure supplements.  Hypothyroidism- synthroid  Continue Aricept per primary care.  GERD- Pantoprazole  Labs-TSH wnl, lipid panel wnl, obtain UA  Disposition- After ECT treatment to outpatient Psychiatrist. Patient may need assisted living or nursing home based on response to ECT.  No ECT was done today. Next treatment will be tomorrow morning. Patient was agreeable to the plan. I reassured her that the likelihood was good that she would be showing  improvement over the next couple treatments and that if we  could really get her well then discharge home would be a much more viable plan. No alteration to her medicine for today.    Alethia Berthold, MD 06/24/2016, 6:14 PM

## 2016-06-24 NOTE — Telephone Encounter (Signed)
Have you called about this?

## 2016-06-24 NOTE — Telephone Encounter (Signed)
About what?

## 2016-06-24 NOTE — Progress Notes (Signed)
Recreation Therapy Notes  Date: 03.06.18 Time: 9:30 am Location: Craft Room  Group Topic: Self-expression  Goal Area(s) Addresses:  Patient will effectively use art as a means of self-expression. Patient will recognize positive benefit of self-expression. Patient will be able to identify one emotion experienced during group session. Patient will identify use of art as a coping skill.  Behavioral Response: Did not attend  Intervention: Two Faces of Me  Activity: Patients were given a blank face worksheet and were instructed to draw a line down the middle. On one side of the worksheet, patients were instructed to draw or write how they felt when they were admitted to the hospital, On the other side, they were instructed to draw or write how they want to feel when they are d/c.  Education: LRT educated patients on other forms of self-expression.  Education Outcome: Patient did not attend group.   Clinical Observations/Feedback: Patient did not attend group.  Leonette Monarch, LRT/CTRS 06/24/2016 9:50 AM

## 2016-06-24 NOTE — Anesthesia Postprocedure Evaluation (Signed)
Anesthesia Post Note  Patient: Cassidy [name]  Procedure(s) Performed: * No procedures listed *  Patient location during evaluation: PACU Anesthesia Type: General Level of consciousness: awake and alert Pain management: pain level controlled Vital Signs Assessment: post-procedure vital signs reviewed and stable Respiratory status: spontaneous breathing, nonlabored ventilation, respiratory function stable and patient connected to nasal cannula oxygen Cardiovascular status: blood pressure returned to baseline and stable Postop Assessment: no signs of nausea or vomiting Anesthetic complications: no     Last Vitals:  Vitals:   06/23/16 1333 06/24/16 0715  BP: 124/64 128/62  Pulse: 84 78  Resp: 18 16  Temp: 36.4 C 36.7 C    Last Pain:  Vitals:   06/24/16 0715  TempSrc: Oral  PainSc:                  Molli Barrows

## 2016-06-24 NOTE — Progress Notes (Signed)
Recreation Therapy Notes  INPATIENT RECREATION THERAPY ASSESSMENT  Patient Details Name: Cassidy [name] MRN: [id number] DOB: [birth year] Today's Date: 2016/07/14  Patient Stressors: Death, Other (Comment) (Daughter died in 03-Sep-2022 on 07/06/15 - does not know how she died pt thinks she was shot; stressed with being in the hospital)  Coping Skills:   Talking, Music, Sports  Personal Challenges: Stress Management  Leisure Interests (2+):  Individual - TV, Individual - Other (Comment) (Drink chocolate milk)  Awareness of Community Resources:  No  Community Resources:     Current Use:    If no, Barriers?:    Patient Strengths:  Quarry manager, helpful  Patient Identified Areas of Improvement:  To get out of here  Current Recreation Participation:  Not much  Patient Goal for Hospitalization:  To get out of here  [place] of Residence:  [place] of Residence:  [place]   Current SI (including self-harm):  No  Current HI:  No  Consent to Intern Participation: N/A   Leonette Monarch, LRT/CTRS 07/14/2016, 4:00 PM

## 2016-06-24 NOTE — Plan of Care (Signed)
Problem: Coping: Goal: Ability to verbalize frustrations and anger appropriately will improve Outcome: Not Met (add Reason) Patient calm and cooperative. Remain in throughout the shift. Medications given at the bedside w/education. No PRNs given. No voiced thoughts of hurting herself. No behavior issues noted. Safety maintained w/ q 15 min checks.

## 2016-06-24 NOTE — Telephone Encounter (Signed)
Dr. Ouida Sills [phone]

## 2016-06-24 NOTE — Telephone Encounter (Signed)
Please inform Dr. Tonette Bihari office that patient has been hospitalized at [facility]

## 2016-06-24 NOTE — Progress Notes (Signed)
D: Pt denies SI/HI/AVH. Pt is pleasant and cooperative. Pt stated she was doing ok.  A: Pt was offered support and encouragement. Pt was given scheduled medications. Pt was encourage to attend groups. Q 15 minute checks were done for safety.   R:Pt receptive to treatment and safety maintained on unit.

## 2016-06-24 NOTE — Progress Notes (Signed)
Pt currently refusing to get out of bed, come to dayroom and eat dinner. Support and encouragement provided. Safety maintained with every 15 minute checks. Will continue to monitor.

## 2016-06-25 ENCOUNTER — Other Ambulatory Visit: Payer: Self-pay | Admitting: Psychiatry

## 2016-06-25 ENCOUNTER — Inpatient Hospital Stay: Payer: Medicare Other | Admitting: Anesthesiology

## 2016-06-25 LAB — GLUCOSE, CAPILLARY: GLUCOSE-CAPILLARY: 101 mg/dL — AB (ref 65–99)

## 2016-06-25 MED ORDER — ONDANSETRON HCL 4 MG/2ML IJ SOLN: 4.0000 mg | Freq: Once | INTRAMUSCULAR | Status: DC | PRN

## 2016-06-25 MED ORDER — SODIUM CHLORIDE 0.9 % IV SOLN
INTRAVENOUS | Status: DC | PRN
  Administered 2016-06-25: 10:00:00 via INTRAVENOUS

## 2016-06-25 MED ORDER — SUCCINYLCHOLINE CHLORIDE 20 MG/ML IJ SOLN
INTRAMUSCULAR | Status: AC
  Filled 2016-06-25: qty 1

## 2016-06-25 MED ORDER — METHOHEXITAL SODIUM 100 MG/10ML IV SOSY
PREFILLED_SYRINGE | INTRAVENOUS | Status: DC | PRN
  Administered 2016-06-25: 80 mg via INTRAVENOUS

## 2016-06-25 MED ORDER — SUCCINYLCHOLINE CHLORIDE 200 MG/10ML IV SOSY
PREFILLED_SYRINGE | INTRAVENOUS | Status: DC | PRN
  Administered 2016-06-25: 80 mg via INTRAVENOUS

## 2016-06-25 MED ORDER — SODIUM CHLORIDE 0.9 % IV SOLN
500.0000 mL | Freq: Once | INTRAVENOUS | Status: AC
  Administered 2016-06-25: 1000 mL via INTRAVENOUS

## 2016-06-25 NOTE — Anesthesia Post-op Follow-up Note (Cosign Needed)
Anesthesia QCDR form completed.        

## 2016-06-25 NOTE — Progress Notes (Signed)
Recreation Therapy Notes  Date: 03.07.18 Time: 9:30 am Location: Craft Room  Group Topic: Self-esteem  Goal Area(s) Addresses:  Patient will write at least one positive trait about self. Patient will verbalize benefit of having a healthy self-esteem.  Behavioral Response: Did not attend  Intervention: I Am  Activity: Patients were given a worksheet with the letter I on it and were instructed to write as many positive traits about themselves inside the letter.  Education: LRT educated patients on ways they can increase their self-esteem.  Education Outcome: Patient did not attend group.  Clinical Observations/Feedback: Patient at ECT treatment during group.  Leonette Monarch, LRT/CTRS 06/25/2016 9:47 AM

## 2016-06-25 NOTE — Procedures (Signed)
ECT SERVICES Physician's Interval Evaluation & Treatment Note  Patient Identification: Cassidy [name] MRN:  [id number] Date of Evaluation:  06/25/2016 TX #: 2  MADRS:   MMSE:   P.E. Findings:  Heart and lungs clear. No change to physical exam. Vitals unremarkable  Psychiatric Interval Note:  Awake alert not delirious. Mood still depressed and anxious  Subjective:  Patient is a 76 y.o. female seen for evaluation for Electroconvulsive Therapy. Feeling nervous and still focused on discharge  Treatment Summary:   [x]   Right Unilateral             []  Bilateral   % Energy : 0.3 ms 80%   Impedance: 1690 ohms  Seizure Energy Index: 29,042 V squared  Postictal Suppression Index: Less than 10%  Seizure Concordance Index: 95%  Medications  Pre Shock: Brevital 80 mg succinylcholine 80 mg  Post Shock:    Seizure Duration: 24 seconds by EMG 78 seconds by EEG   Comments: Continue inpatient at this point next treatment Friday   Lungs:  [x]   Clear to auscultation               []  Other:   Heart:    [x]   Regular rhythm             []  irregular rhythm    [x]   Previous H&P reviewed, patient examined and there are NO CHANGES                 []   Previous H&P reviewed, patient examined and there are changes noted.   Cassidy Berthold, MD 3/7/201810:30 AM

## 2016-06-25 NOTE — Progress Notes (Signed)
[facility] MD Progress Note  06/25/2016 7:46 PM Cassidy [name]  MRN:  [id number] Subjective:Patient is a 76 yo woman with severe depression , decompensating over past few months with poor nutrition and not responsive to multiple medication changes, seen by outpatient psychiatrist at [facility]. Admitted for stabilization .  Patient reports being unhappy being in the hospital. Per her sister she has not showered in months.She was initially resistant but showered with help of tech. She has not been eating, consumption less than 10 % of her meals. She is compliant with medications. Minimal participation in groups. Continues to say that her grandson is the only one keeping her from dying.  Follow-up Wednesday the seventh. Second ECT treatment today. Well tolerated. Patient continues to say she is not feeling well but mostly because she wants to go home. She claims she is eating better but still only a fraction of her meal. Still stays in bed most of the time although she claims she has been getting up and going to some groups.  Principal Problem: Severe recurrent major depression without psychotic features ([place]) Diagnosis:   Patient Active Problem List   Diagnosis Date Noted  . Severe recurrent major depression without psychotic features ([place]) [F33.2] 06/19/2016  . Hepatic encephalopathy ([place]) [K72.90] 08/13/2015  . Thrombocytopenia ([place]) [D69.6] 07/23/2015  . Amnesia [R41.3] 03/20/2015  . Major depression in remission ([place]) [F32.5] 03/12/2015  . Gonalgia [M25.569] 01/05/2015  . Absolute anemia [D64.9] 11/27/2014  . Arthritis [M19.90] 11/27/2014  . Acid reflux [K21.9] 11/27/2014  . Depression, major, recurrent, in partial remission ([place]) [F33.41] 11/27/2014  . Encounter for general adult medical examination without abnormal findings [Z00.00] 08/25/2014  . Chronic LBP [M54.5, G89.29] 08/15/2014  . 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 Infraspinatus tenosynovitis [M65.819]  05/01/2014  . Other synovitis and tenosynovitis, right shoulder [M65.811] 05/01/2014  . Difficulty in walking [R26.2] 11/30/2013  . Extreme obesity ([place]) [E66.01] 11/30/2013  . Morbid obesity ([place]) [E66.01] 11/30/2013  . Arthritis, degenerative [M19.90] 10/01/2013  . Steatohepatitis [K75.81] 10/01/2013  . Orthostasis [I95.1] 10/01/2013  . Appendicular ataxia [R27.0] 09/26/2013  . Fall [W19.XXXA] 09/26/2013  . Dizziness [R42] 09/07/2013  . Osteoporosis with fracture [M80.00XA] 09/06/2013  . Clinical depression [F32.9] 03/25/2012  . Adult hypothyroidism [E03.9] 03/25/2012  . Esophageal stenosis [K22.2] 07/03/2009  . Back ache [M54.9] 03/08/2003  . Anxiety state [F41.1] 12/27/2002   Total Time spent with patient: 20 minutes  Past Psychiatric History: Patient has had prior psychiatric admissions here at our hospital before. Denies suicide attempts. Patient has been on multiple antidepressants without consistent benefit. She is currently on Lexapro and BuSpar and Seroquel  Past Medical History:  Past Medical History:  Diagnosis Date  . Anxiety   . Depression   . Fatty liver   . GERD (gastroesophageal reflux disease)   . Hypothyroidism   . Obesity   . Thyroid disease     Past Surgical History:  Procedure Laterality Date  . APPENDECTOMY    . ESOPHAGOGASTRODUODENOSCOPY (EGD) WITH PROPOFOL N/A 12/20/2014   Procedure: ESOPHAGOGASTRODUODENOSCOPY (EGD) WITH PROPOFOL;  Surgeon: Manya Silvas, MD;  Location: [facility] ENDOSCOPY;  Service: Endoscopy;  Laterality: N/A;  . SAVORY DILATION N/A 12/20/2014   Procedure: SAVORY DILATION;  Surgeon: Manya Silvas, MD;  Location: [facility] ENDOSCOPY;  Service: Endoscopy;  Laterality: N/A;  . TONSILLECTOMY     Family History:  Family History  Problem Relation Age of Onset  . Diabetes Mother   . COPD Mother   .  Kidney disease Mother   . Depression Mother   . Heart attack Father   . Aneurysm Father   . Anxiety disorder Sister   . Depression Sister    . Diabetes Sister   . Hypertension Sister   . Atrial fibrillation Brother   . Spinal muscular atrophy Brother   . Breast cancer Sister   . Bone cancer Sister   . Depression Sister   . Anxiety disorder Sister   . Breast cancer Sister   . Colon cancer Sister   . Depression Sister   . Diabetes Sister   . COPD Sister   . Depression Sister   . Anxiety disorder Sister   . Diabetes Brother   . Depression Brother    Family Psychiatric  History: see above Social History:  History  Alcohol Use No     History  Drug Use No    Social History   Social History  . Marital status: Widowed    Spouse name: N/A  . Number of children: N/A  . Years of education: N/A   Social History Main Topics  . Smoking status: Never Smoker  . Smokeless tobacco: Never Used  . Alcohol use No  . Drug use: No  . Sexual activity: Not Currently    Birth control/ protection: Post-menopausal   Other Topics Concern  . None   Social History Narrative  . None   Additional Social History:                      Sleep: Fair  Appetite:  Poor  Current Medications: Current Facility-Administered Medications  Medication Dose Route Frequency Provider Last Rate Last Dose  . acetaminophen (TYLENOL) tablet 650 mg  650 mg Oral Q6H PRN Gonzella Lex, MD      . alum & mag hydroxide-simeth (MAALOX/MYLANTA) 200-200-20 MG/5ML suspension 30 mL  30 mL Oral Q4H PRN Gonzella Lex, MD      . busPIRone (BUSPAR) tablet 15 mg  15 mg Oral BID Gonzella Lex, MD   15 mg at 06/25/16 1700  . celecoxib (CELEBREX) capsule 100 mg  100 mg Oral Daily Gonzella Lex, MD   100 mg at 06/25/16 1159  . ciprofloxacin (CIPRO) tablet 250 mg  250 mg Oral BID Gonzella Lex, MD   250 mg at 06/25/16 1200  . donepezil (ARICEPT) tablet 5 mg  5 mg Oral QHS Gonzella Lex, MD   5 mg at 06/24/16 2150  . escitalopram (LEXAPRO) tablet 10 mg  10 mg Oral QHS Gonzella Lex, MD   10 mg at 06/24/16 2149  . feeding supplement (ENSURE ENLIVE)  (ENSURE ENLIVE) liquid 237 mL  237 mL Oral BID BM Himabindu Ravi, MD   237 mL at 06/25/16 1400  . fentaNYL (SUBLIMAZE) injection 25 mcg  25 mcg Intravenous Q5 min PRN Molli Barrows, MD      . fluticasone [facility]) 50 MCG/ACT nasal spray 2 spray  2 spray Each Nare Daily Gonzella Lex, MD   2 spray at 06/23/16 1140  . levothyroxine (SYNTHROID, LEVOTHROID) tablet 75 mcg  75 mcg Oral QAC breakfast Gonzella Lex, MD   75 mcg at 06/25/16 1159  . magnesium hydroxide (MILK OF MAGNESIA) suspension 30 mL  30 mL Oral Daily PRN Gonzella Lex, MD      . ondansetron [facility]) injection 4 mg  4 mg Intravenous Once PRN Molli Barrows, MD      . ondansetron (  ZOFRAN) injection 4 mg  4 mg Intravenous Once PRN Gunnar Bulla, MD      . pantoprazole (PROTONIX) EC tablet 40 mg  40 mg Oral QAC breakfast Gonzella Lex, MD   40 mg at 06/25/16 1200  . QUEtiapine (SEROQUEL) tablet 100 mg  100 mg Oral QHS Himabindu Ravi, MD   100 mg at 06/24/16 2150    Lab Results:  Results for orders placed or performed during the hospital encounter of 06/20/16 (from the past 48 hour(s))  Glucose, capillary     Status: Abnormal   Collection Time: 06/25/16  6:02 AM  Result Value Ref Range   Glucose-Capillary 101 (H) 65 - 99 mg/dL    Blood Alcohol level:  Lab Results  Component Value Date   ETH <5 60/63/0160    Metabolic Disorder Labs: Lab Results  Component Value Date   HGBA1C 5.3 06/21/2016   MPG 105 06/21/2016   No results found for: PROLACTIN Lab Results  Component Value Date   CHOL 145 06/21/2016   TRIG 51 06/21/2016   HDL 44 06/21/2016   CHOLHDL 3.3 06/21/2016   VLDL 10 06/21/2016   LDLCALC 91 06/21/2016   LDLCALC 70 08/22/2013    Physical Findings: AIMS:  , ,  ,  ,    CIWA:    COWS:     Musculoskeletal: Strength & Muscle Tone: weak, some generalized atrophy due to patient limiting her mobility Gait & Station: unsteady, broad based Patient leans: Front  Psychiatric Specialty Exam: Physical Exam    ROS   Blood pressure (!) 124/50, pulse 82, temperature 98.5 F (36.9 C), temperature source Oral, resp. rate 18, height 5' 2"  (1.575 m), weight 78.9 kg (174 lb), SpO2 95 %.Body mass index is 31.83 kg/m.  General Appearance: Casual  Eye Contact:  Fair  Speech:  Slow, soft, minimal  Volume:  Decreased  Mood:  Depressed, Dysphoric, Hopeless and Irritable  Affect:  Constricted and Depressed  Thought Process:  Coherent  Orientation:  Full (Time, Place, and Person)  Thought Content:  Rumination  Suicidal Thoughts:  Yes.  without intent/plan  Homicidal Thoughts:  No  Memory:  Immediate;   Fair Recent;   Fair Remote;   Fair  Judgement:  Impaired  Insight:  Lacking  Psychomotor Activity:  Psychomotor Retardation  Concentration:  Concentration: Fair and Attention Span: Fair  Recall:  [employer] of Knowledge:  Fair  Language:  Fair  Akathisia:  No  Handed:  Right  AIMS (if indicated):     Assets:  Communication Skills Housing  ADL's:  Impaired  Cognition:  WNL  Sleep:  Number of Hours: 7.15     Treatment Plan Summary: Daily contact with patient to assess and evaluate symptoms and progress in treatment and Medication management   Major Depressive Disorder Continue increased dose of Lexapro and current seroquel. Consult to ECT since patient not responding to multiple medications and decompensating.  Anxiety- Continue Buspar  Malnutrition- Nutrition consult done, recommendations being followed with ensure supplements.  Hypothyroidism- synthroid  Continue Aricept per primary care.  GERD- Pantoprazole  Labs-TSH wnl, lipid panel wnl, obtain UA  Disposition- After ECT treatment to outpatient Psychiatrist. Patient may need assisted living or nursing home based on response to ECT.  ECT today which went well. Hoping to see some improvement soon. Coached her to try to get up and attend more groups and make sure she is eating well. She does seem to be taking care of her hygiene  at  least reasonably well. No change in medicine for today.    Alethia Berthold, MD 06/25/2016, 7:46 PM

## 2016-06-25 NOTE — Anesthesia Postprocedure Evaluation (Signed)
Anesthesia Post Note  Patient: Cassidy [name]  Procedure(s) Performed: * No procedures listed *  Patient location during evaluation: PACU Anesthesia Type: General Level of consciousness: awake and alert Pain management: pain level controlled Vital Signs Assessment: post-procedure vital signs reviewed and stable Respiratory status: spontaneous breathing, nonlabored ventilation, respiratory function stable and patient connected to nasal cannula oxygen Cardiovascular status: blood pressure returned to baseline and stable Postop Assessment: no signs of nausea or vomiting Anesthetic complications: no     Last Vitals:  Vitals:   06/25/16 1108 06/25/16 1118  BP: (!) 135/51   Pulse: 94   Resp: (!) 23   Temp:  36.7 C    Last Pain:  Vitals:   06/25/16 0843  TempSrc: Oral  PainSc:                  Tawna Alwin S

## 2016-06-25 NOTE — BHH Group Notes (Signed)
  [facility] LCSW Group Therapy Note  Date/Time: 06/25/16, 1300  Type of Therapy/Topic:  Group Therapy:  Emotion Regulation  Participation Level:  Did Not Attend   Mood:  Description of Group:    The purpose of this group is to assist patients in learning to regulate negative emotions and experience positive emotions. Patients will be guided to discuss ways in which they have been vulnerable to their negative emotions. These vulnerabilities will be juxtaposed with experiences of positive emotions or situations, and patients challenged to use positive emotions to combat negative ones. Special emphasis will be placed on coping with negative emotions in conflict situations, and patients will process healthy conflict resolution skills.  Therapeutic Goals: 1. Patient will identify two positive emotions or experiences to reflect on in order to balance out negative emotions:  2. Patient will label two or more emotions that they find the most difficult to experience:  3. Patient will be able to demonstrate positive conflict resolution skills through discussion or role plays:   Summary of Patient Progress:       Therapeutic Modalities:   Cognitive Behavioral Therapy Feelings Identification Dialectical Behavioral Therapy  Lurline Idol, LCSW

## 2016-06-25 NOTE — Plan of Care (Signed)
Problem: Coping: Goal: Ability to cope will improve Outcome: Not Progressing Patient stated that she is depressed.8/10

## 2016-06-25 NOTE — Anesthesia Preprocedure Evaluation (Signed)
Anesthesia Evaluation  Patient identified by MRN, date of birth, ID band Patient awake    Reviewed: Allergy & Precautions, NPO status , Patient's Chart, lab work & pertinent test results, reviewed documented beta blocker date and time   Airway Mallampati: II  TM Distance: >3 FB     Dental  (+) Chipped   Pulmonary           Cardiovascular      Neuro/Psych PSYCHIATRIC DISORDERS Anxiety Depression    GI/Hepatic GERD  Controlled,(+) Hepatitis -  Endo/Other  Hypothyroidism   Renal/GU      Musculoskeletal  (+) Arthritis ,   Abdominal   Peds  Hematology  (+) anemia ,   Anesthesia Other Findings   Reproductive/Obstetrics                             Anesthesia Physical Anesthesia Plan  ASA: III  Anesthesia Plan: General   Post-op Pain Management:    Induction: Intravenous  Airway Management Planned:   Additional Equipment:   Intra-op Plan:   Post-operative Plan:   Informed Consent: I have reviewed the patients History and Physical, chart, labs and discussed the procedure including the risks, benefits and alternatives for the proposed anesthesia with the patient or authorized representative who has indicated his/her understanding and acceptance.     Plan Discussed with: CRNA  Anesthesia Plan Comments:         Anesthesia Quick Evaluation

## 2016-06-25 NOTE — Progress Notes (Signed)
Patient rated her depression & anxiety 8/10.Back from ECT.Continues to be sad & depressed.Stated "I should not have come here."Appetite & energy level poor.Took ensure with encouragement.Compliant with medications.Support & encouragement given.

## 2016-06-25 NOTE — H&P (Signed)
Cassidy [name] is an 76 y.o. female.   Chief Complaint: Patient continues to feel anxious depressed withdrawn and weak HPI: History of severe depression unresponsive to medication with progressive weight loss and lack of functioning now inpatient be getting ECT treatment  Past Medical History:  Diagnosis Date  . Anxiety   . Depression   . Fatty liver   . GERD (gastroesophageal reflux disease)   . Hypothyroidism   . Obesity   . Thyroid disease     Past Surgical History:  Procedure Laterality Date  . APPENDECTOMY    . ESOPHAGOGASTRODUODENOSCOPY (EGD) WITH PROPOFOL N/A 12/20/2014   Procedure: ESOPHAGOGASTRODUODENOSCOPY (EGD) WITH PROPOFOL;  Surgeon: Manya Silvas, MD;  Location: [facility] ENDOSCOPY;  Service: Endoscopy;  Laterality: N/A;  . SAVORY DILATION N/A 12/20/2014   Procedure: SAVORY DILATION;  Surgeon: Manya Silvas, MD;  Location: [facility] ENDOSCOPY;  Service: Endoscopy;  Laterality: N/A;  . TONSILLECTOMY      Family History  Problem Relation Age of Onset  . Diabetes Mother   . COPD Mother   . Kidney disease Mother   . Depression Mother   . Heart attack Father   . Aneurysm Father   . Anxiety disorder Sister   . Depression Sister   . Diabetes Sister   . Hypertension Sister   . Atrial fibrillation Brother   . Spinal muscular atrophy Brother   . Breast cancer Sister   . Bone cancer Sister   . Depression Sister   . Anxiety disorder Sister   . Breast cancer Sister   . Colon cancer Sister   . Depression Sister   . Diabetes Sister   . COPD Sister   . Depression Sister   . Anxiety disorder Sister   . Diabetes Brother   . Depression Brother    Social History:  reports that she has never smoked. She has never used smokeless tobacco. She reports that she does not drink alcohol or use drugs.  Allergies:  Allergies  Allergen Reactions  . Sulfa Antibiotics Shortness Of Breath    Medications Prior to Admission  Medication Sig Dispense Refill  . busPIRone (BUSPAR) 15 MG  tablet Take 1 tablet (15 mg total) by mouth 2 (two) times daily. 180 tablet 0  . celecoxib (CELEBREX) 100 MG capsule TAKE ONE CAPSULE BY MOUTH TWICE A DAY    . donepezil (ARICEPT) 5 MG tablet Take 5 mg by mouth at bedtime.     Marland Kitchen escitalopram (LEXAPRO) 5 MG tablet Take 1 tablet (5 mg total) by mouth at bedtime. (Patient taking differently: Take 10 mg by mouth at bedtime. ) 30 tablet 3  . fluticasone (FLONASE) 50 MCG/ACT nasal spray Place 1 spray into both nostrils daily.    . hydrOXYzine (VISTARIL) 25 MG capsule Take 1 capsule (25 mg total) by mouth 3 (three) times daily as needed. 30 capsule 0  . KLOR-CON 10 10 MEQ tablet Take 10 mEq by mouth daily.    Marland Kitchen levothyroxine (SYNTHROID, LEVOTHROID) 75 MCG tablet Take 75 mcg by mouth daily before breakfast.    . omeprazole (PRILOSEC) 20 MG capsule Take 20 mg by mouth 2 (two) times daily.    . QUEtiapine (SEROQUEL) 100 MG tablet Take 1 tablet (100 mg total) by mouth at bedtime. (Patient taking differently: Take 400 mg by mouth at bedtime. ) 30 tablet 3  . traZODone (DESYREL) 100 MG tablet Take 1 tablet (100 mg total) by mouth at bedtime. 30 tablet 3    Results for orders  placed or performed during the hospital encounter of 06/20/16 (from the past 48 hour(s))  Glucose, capillary     Status: Abnormal   Collection Time: 06/25/16  6:02 AM  Result Value Ref Range   Glucose-Capillary 101 (H) 65 - 99 mg/dL   No results found.  Review of Systems  Constitutional: Positive for malaise/fatigue and weight loss.  HENT: Negative.   Eyes: Negative.   Respiratory: Negative.   Cardiovascular: Negative.   Gastrointestinal: Negative.   Musculoskeletal: Negative.   Skin: Negative.   Neurological: Positive for weakness.  Psychiatric/Behavioral: Positive for depression and memory loss. Negative for hallucinations, substance abuse and suicidal ideas. The patient is nervous/anxious. The patient does not have insomnia.     Blood pressure (!) 153/60, pulse 87,  temperature 97.9 F (36.6 C), temperature source Oral, resp. rate 16, height 5' 2"  (1.575 m), weight 78.9 kg (174 lb), SpO2 98 %. Physical Exam  Nursing note and vitals reviewed. Constitutional: She appears well-developed and well-nourished.  HENT:  Head: Normocephalic and atraumatic.  Eyes: Conjunctivae are normal. Pupils are equal, round, and reactive to light.  Neck: Normal range of motion.  Cardiovascular: Regular rhythm and normal heart sounds.   Respiratory: Effort normal. No respiratory distress.  GI: Soft.  Musculoskeletal: Normal range of motion.  Neurological: She is alert.  Skin: Skin is warm and dry.  Psychiatric: Her mood appears anxious. Her speech is delayed. She is slowed and withdrawn. She expresses inappropriate judgment. She exhibits a depressed mood. She expresses no suicidal ideation. She exhibits abnormal recent memory.     Assessment/Plan Index treatment #2 today continue 3 times a week schedule as usual. Still inpatient at this point.  Alethia Berthold, MD 06/25/2016, 10:28 AM

## 2016-06-25 NOTE — Transfer of Care (Signed)
Immediate Anesthesia Transfer of Care Note  Patient: Cassidy [name]  Procedure(s) Performed: ECT  Patient Location: PACU  Anesthesia Type:General  Level of Consciousness: sedated  Airway & Oxygen Therapy: Patient Spontanous Breathing and Patient connected to face mask oxygen  Post-op Assessment: Report given to RN and Post -op Vital signs reviewed and stable  Post vital signs: Reviewed and stable  Last Vitals:  Vitals:   06/25/16 0843 06/25/16 1048  BP: (!) 153/60 (!) 153/87  Pulse: 87 (!) 105  Resp: 16 (!) 26  Temp: 36.6 C 36.7 C    Last Pain:  Vitals:   06/25/16 0843  TempSrc: Oral  PainSc:          Complications: No apparent anesthesia complications

## 2016-06-26 ENCOUNTER — Other Ambulatory Visit: Payer: Self-pay | Admitting: Psychiatry

## 2016-06-26 NOTE — Plan of Care (Signed)
Problem: [facility] Participation in Recreation Therapeutic Interventions Goal: STG-Other Recreation Therapy Goal (Specify) STG: Stress Management - Within 4 treatment sessions, patient will verbalize understanding of the stress management techniques in each of 2 treatment sessions to increase stress management skills.  Outcome: Progressing Treatment Session 1; Completed 1 out of 2: At approximately 10:40 am, LRT met with patient in patient room. LRT educated and provided patient with handouts on stress management techniques. Patient verbalized understanding. LRT encouraged patient to read over and practice the stress management techniques.  Leonette Monarch, LRT/CTRS 03.08.18 3:40 pm

## 2016-06-26 NOTE — Progress Notes (Cosign Needed)
CSW spoke with pt's grandson, Janett Labella, [phone].  We discussed pt ongoing desire to go home ASAP and [name] reports that he is going to call his grandmother and encourage her to remain in the hospital through Monday as the MD is recommending. He said that pt sisters should be able to assist in getting her to the hospital for follow up ECT treatments after she is discharged. Winferd Humphrey, MSW, LCSW Clinical Social Worker 06/26/2016 2:52 PM

## 2016-06-26 NOTE — Plan of Care (Signed)
Problem: Coping: Goal: Ability to cope will improve Outcome: Progressing Continue to work on Scientific laboratory technician

## 2016-06-26 NOTE — Tx Team (Signed)
Interdisciplinary Treatment and Diagnostic Plan Update  06/26/2016 Time of Session: [place] MRN: [id number]  Principal Diagnosis: Severe recurrent major depression without psychotic features [facility])  Secondary Diagnoses: Principal Problem:   Severe recurrent major depression without psychotic features ([place])   Current Medications:  Current Facility-Administered Medications  Medication Dose Route Frequency Provider Last Rate Last Dose  . acetaminophen (TYLENOL) tablet 650 mg  650 mg Oral Q6H PRN Gonzella Lex, MD      . alum & mag hydroxide-simeth (MAALOX/MYLANTA) 200-200-20 MG/5ML suspension 30 mL  30 mL Oral Q4H PRN Gonzella Lex, MD      . busPIRone (BUSPAR) tablet 15 mg  15 mg Oral BID Gonzella Lex, MD   15 mg at 06/26/16 0844  . celecoxib (CELEBREX) capsule 100 mg  100 mg Oral Daily Gonzella Lex, MD   100 mg at 06/26/16 0844  . ciprofloxacin (CIPRO) tablet 250 mg  250 mg Oral BID Gonzella Lex, MD   250 mg at 06/26/16 0845  . donepezil (ARICEPT) tablet 5 mg  5 mg Oral QHS Gonzella Lex, MD   5 mg at 06/25/16 2149  . escitalopram (LEXAPRO) tablet 10 mg  10 mg Oral QHS Gonzella Lex, MD   10 mg at 06/25/16 2149  . feeding supplement (ENSURE ENLIVE) (ENSURE ENLIVE) liquid 237 mL  237 mL Oral BID BM Himabindu Ravi, MD   237 mL at 06/25/16 1400  . fentaNYL (SUBLIMAZE) injection 25 mcg  25 mcg Intravenous Q5 min PRN Molli Barrows, MD      . fluticasone [facility]) 50 MCG/ACT nasal spray 2 spray  2 spray Each Nare Daily Gonzella Lex, MD   2 spray at 06/23/16 1140  . levothyroxine (SYNTHROID, LEVOTHROID) tablet 75 mcg  75 mcg Oral QAC breakfast Gonzella Lex, MD   75 mcg at 06/26/16 0844  . magnesium hydroxide (MILK OF MAGNESIA) suspension 30 mL  30 mL Oral Daily PRN Gonzella Lex, MD      . ondansetron (ZOFRAN) injection 4 mg  4 mg Intravenous Once PRN Molli Barrows, MD      . ondansetron [facility]) injection 4 mg  4 mg Intravenous Once PRN Gunnar Bulla, MD      .  pantoprazole (PROTONIX) EC tablet 40 mg  40 mg Oral QAC breakfast Gonzella Lex, MD   40 mg at 06/26/16 0845  . QUEtiapine (SEROQUEL) tablet 100 mg  100 mg Oral QHS Himabindu Ravi, MD   100 mg at 06/25/16 2149   PTA Medications: Prescriptions Prior to Admission  Medication Sig Dispense Refill Last Dose  . busPIRone (BUSPAR) 15 MG tablet Take 1 tablet (15 mg total) by mouth 2 (two) times daily. 180 tablet 0 06/20/2016 at Unknown time  . celecoxib (CELEBREX) 100 MG capsule TAKE ONE CAPSULE BY MOUTH TWICE A DAY   06/20/2016 at Unknown time  . donepezil (ARICEPT) 5 MG tablet Take 5 mg by mouth at bedtime.    06/20/2016 at Unknown time  . escitalopram (LEXAPRO) 5 MG tablet Take 1 tablet (5 mg total) by mouth at bedtime. (Patient taking differently: Take 10 mg by mouth at bedtime. ) 30 tablet 3 06/20/2016 at Unknown time  . fluticasone (FLONASE) 50 MCG/ACT nasal spray Place 1 spray into both nostrils daily.   06/20/2016 at Unknown time  . hydrOXYzine (VISTARIL) 25 MG capsule Take 1 capsule (25 mg total) by mouth 3 (three) times daily as needed. 30 capsule 0  06/20/2016 at Unknown time  . KLOR-CON 10 10 MEQ tablet Take 10 mEq by mouth daily.   06/20/2016 at Unknown time  . levothyroxine (SYNTHROID, LEVOTHROID) 75 MCG tablet Take 75 mcg by mouth daily before breakfast.   06/25/2016 at Unknown time  . omeprazole (PRILOSEC) 20 MG capsule Take 20 mg by mouth 2 (two) times daily.   06/25/2016 at Unknown time  . QUEtiapine (SEROQUEL) 100 MG tablet Take 1 tablet (100 mg total) by mouth at bedtime. (Patient taking differently: Take 400 mg by mouth at bedtime. ) 30 tablet 3 06/20/2016 at Unknown time  . traZODone (DESYREL) 100 MG tablet Take 1 tablet (100 mg total) by mouth at bedtime. 30 tablet 3 Past Week at Unknown time    Patient Stressors: Health problems Loss of daughter Medication change or noncompliance  Patient Strengths: Technical sales engineer for treatment/growth Supportive family/friends  Treatment  Modalities: Medication Management, Group therapy, Case management,  1 to 1 session with clinician, Psychoeducation, Recreational therapy.   Physician Treatment Plan for Primary Diagnosis: Severe recurrent major depression without psychotic features ([place]) Long Term Goal(s): Improvement in symptoms so as ready for discharge Improvement in symptoms so as ready for discharge   Short Term Goals: Ability to identify changes in lifestyle to reduce recurrence of condition will improve Ability to verbalize feelings will improve Ability to disclose and discuss suicidal ideas Ability to demonstrate self-control will improve Ability to identify and develop effective coping behaviors will improve Ability to maintain clinical measurements within normal limits will improve Compliance with prescribed medications will improve Ability to identify changes in lifestyle to reduce recurrence of condition will improve Ability to verbalize feelings will improve Ability to disclose and discuss suicidal ideas Ability to demonstrate self-control will improve Ability to identify and develop effective coping behaviors will improve Ability to maintain clinical measurements within normal limits will improve Compliance with prescribed medications will improve  Medication Management: Evaluate patient's response, side effects, and tolerance of medication regimen.  Therapeutic Interventions: 1 to 1 sessions, Unit Group sessions and Medication administration.  Evaluation of Outcomes: Progressing  Physician Treatment Plan for Secondary Diagnosis: Principal Problem:   Severe recurrent major depression without psychotic features ([place])  Long Term Goal(s): Improvement in symptoms so as ready for discharge Improvement in symptoms so as ready for discharge   Short Term Goals: Ability to identify changes in lifestyle to reduce recurrence of condition will improve Ability to verbalize feelings will improve Ability to  disclose and discuss suicidal ideas Ability to demonstrate self-control will improve Ability to identify and develop effective coping behaviors will improve Ability to maintain clinical measurements within normal limits will improve Compliance with prescribed medications will improve Ability to identify changes in lifestyle to reduce recurrence of condition will improve Ability to verbalize feelings will improve Ability to disclose and discuss suicidal ideas Ability to demonstrate self-control will improve Ability to identify and develop effective coping behaviors will improve Ability to maintain clinical measurements within normal limits will improve Compliance with prescribed medications will improve     Medication Management: Evaluate patient's response, side effects, and tolerance of medication regimen.  Therapeutic Interventions: 1 to 1 sessions, Unit Group sessions and Medication administration.  Evaluation of Outcomes: Progressing   RN Treatment Plan for Primary Diagnosis: Severe recurrent major depression without psychotic features ([place]) Long Term Goal(s): Knowledge of disease and therapeutic regimen to maintain health will improve  Short Term Goals: Ability to verbalize feelings will improve, Ability to identify and develop  effective coping behaviors will improve and Compliance with prescribed medications will improve  Medication Management: RN will administer medications as ordered by provider, will assess and evaluate patient's response and provide education to patient for prescribed medication. RN will report any adverse and/or side effects to prescribing provider.  Therapeutic Interventions: 1 on 1 counseling sessions, Psychoeducation, Medication administration, Evaluate responses to treatment, Monitor vital signs and CBGs as ordered, Perform/monitor CIWA, COWS, AIMS and Fall Risk screenings as ordered, Perform wound care treatments as ordered.  Evaluation of Outcomes:  Progressing   LCSW Treatment Plan for Primary Diagnosis: Severe recurrent major depression without psychotic features ([place]) Long Term Goal(s): Safe transition to appropriate next level of care at discharge, Engage patient in therapeutic group addressing interpersonal concerns.  Short Term Goals: Engage patient in aftercare planning with referrals and resources, Increase social support and Increase skills for wellness and recovery  Therapeutic Interventions: Assess for all discharge needs, 1 to 1 time with Social worker, Explore available resources and support systems, Assess for adequacy in community support network, Educate family and significant other(s) on suicide prevention, Complete Psychosocial Assessment, Interpersonal group therapy.  Evaluation of Outcomes: Progressing   Progress in Treatment: Attending groups:  No Participating in groups: no Taking medication as prescribed: Yes. Toleration medication: Yes. Family/Significant other contact made: Yes, individual(s) contacted:  sister Patient understands diagnosis: Yes. Discussing patient identified problems/goals with staff: Yes. Medical problems stabilized or resolved: Yes. Denies suicidal/homicidal ideation: Yes. Issues/concerns per patient self-inventory: No. Other: none  New problem(s) identified: No, Describe:  none  New Short Term/Long Term Goal(s): none  Discharge Plan or Barriers: CSW assessing for appropriate plan.  Reason for Continuation of Hospitalization: Depression Other; describe ECT treatments  Estimated Length of Stay:  Attendees: Patient: Cassidy [name] 06/26/2016   Physician: Dr Weber Cooks, MD 06/26/2016   Nursing: Polly Cobia, RN 06/26/2016   RN Care Manager: 06/26/2016   Social Worker: Lurline Idol, LCSW 06/26/2016   Recreational Therapist: Everitt Amber, LRT/CTRS  06/26/2016   Other:  06/26/2016   Other:    Other: 06/26/2016     Scribe for Treatment Team: Joanne Chars, LCSW 06/26/2016 1:32 PM

## 2016-06-26 NOTE — [facility] Group Notes (Signed)
[place] LCSW Group Therapy Note  Date/Time: 06/26/16, 1300  Type of Therapy/Topic:  Group Therapy:  Balance in Life  Participation Level:  Pt did not attend group.  Description of Group:    This group will address the concept of balance and how it feels and looks when one is unbalanced. Patients will be encouraged to process areas in their lives that are out of balance, and identify reasons for remaining unbalanced. Facilitators will guide patients utilizing problem- solving interventions to address and correct the stressor making their life unbalanced. Understanding and applying boundaries will be explored and addressed for obtaining  and maintaining a balanced life. Patients will be encouraged to explore ways to assertively make their unbalanced needs known to significant others in their lives, using other group members and facilitator for support and feedback.  Therapeutic Goals: 1. Patient will identify two or more emotions or situations they have that consume much of in their lives. 2. Patient will identify signs/triggers that life has become out of balance:  3. Patient will identify two ways to set boundaries in order to achieve balance in their lives:  4. Patient will demonstrate ability to communicate their needs through discussion and/or role plays  Summary of Patient Progress:          Therapeutic Modalities:   Cognitive Behavioral Therapy Solution-Focused Therapy Assertiveness Training  Lurline Idol, LCSW

## 2016-06-26 NOTE — [facility] Group Notes (Signed)
[place] Group Notes:  (Nursing/MHT/Case Management/Adjunct)  Date:  06/26/2016  Time:  1:34 AM  Type of Therapy:  Group Therapy  Participation Level:  Did Not Attend   Marylynn Pearson 06/26/2016, 1:34 AM

## 2016-06-26 NOTE — Progress Notes (Signed)
[facility] MD Progress Note  06/26/2016 5:27 PM Cassidy [name]  MRN:  [id number] Subjective:Patient is a 76 yo woman with severe depression , decompensating over past few months with poor nutrition and not responsive to multiple medication changes, seen by outpatient psychiatrist at [facility]. Admitted for stabilization .  Patient reports being unhappy being in the hospital. Per her sister she has not showered in months.She was initially resistant but showered with help of tech. She has not been eating, consumption less than 10 % of her meals. She is compliant with medications. Minimal participation in groups. Continues to say that her grandson is the only one keeping her from dying.  Follow-up for Thursday. Patient met with all treatment team today. Since yesterday she is still eating poorly although she claims that she would do better on a soft diet. We can make that change. She apparently has been getting up and going to groups. Her affect looks a little bit brighter and more interactive and the treatment team agrees that she looks like she is doing better. Still mostly focused on discharge. No sign of side effects of medicine or ECT  Principal Problem: Severe recurrent major depression without psychotic features ([place]) Diagnosis:   Patient Active Problem List   Diagnosis Date Noted  . Severe recurrent major depression without psychotic features ([place]) [F33.2] 06/19/2016  . Hepatic encephalopathy ([place]) [K72.90] 08/13/2015  . Thrombocytopenia ([place]) [D69.6] 07/23/2015  . Amnesia [R41.3] 03/20/2015  . Major depression in remission ([place]) [F32.5] 03/12/2015  . Gonalgia [M25.569] 01/05/2015  . Absolute anemia [D64.9] 11/27/2014  . Arthritis [M19.90] 11/27/2014  . Acid reflux [K21.9] 11/27/2014  . Depression, major, recurrent, in partial remission ([place]) [F33.41] 11/27/2014  . Encounter for general adult medical examination without abnormal findings [Z00.00] 08/25/2014  . Chronic LBP [M54.5, G89.29] 08/15/2014  .  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 Infraspinatus tenosynovitis [M65.819] 05/01/2014  . Other synovitis and tenosynovitis, right shoulder [M65.811] 05/01/2014  . Difficulty in walking [R26.2] 11/30/2013  . Extreme obesity ([place]) [E66.01] 11/30/2013  . Morbid obesity ([place]) [E66.01] 11/30/2013  . Arthritis, degenerative [M19.90] 10/01/2013  . Steatohepatitis [K75.81] 10/01/2013  . Orthostasis [I95.1] 10/01/2013  . Appendicular ataxia [R27.0] 09/26/2013  . Fall [W19.XXXA] 09/26/2013  . Dizziness [R42] 09/07/2013  . Osteoporosis with fracture [M80.00XA] 09/06/2013  . Clinical depression [F32.9] 03/25/2012  . Adult hypothyroidism [E03.9] 03/25/2012  . Esophageal stenosis [K22.2] 07/03/2009  . Back ache [M54.9] 03/08/2003  . Anxiety state [F41.1] 12/27/2002   Total Time spent with patient: 20 minutes  Past Psychiatric History: Patient has had prior psychiatric admissions here at our hospital before. Denies suicide attempts. Patient has been on multiple antidepressants without consistent benefit. She is currently on Lexapro and BuSpar and Seroquel  Past Medical History:  Past Medical History:  Diagnosis Date  . Anxiety   . Depression   . Fatty liver   . GERD (gastroesophageal reflux disease)   . Hypothyroidism   . Obesity   . Thyroid disease     Past Surgical History:  Procedure Laterality Date  . APPENDECTOMY    . ESOPHAGOGASTRODUODENOSCOPY (EGD) WITH PROPOFOL N/A 12/20/2014   Procedure: ESOPHAGOGASTRODUODENOSCOPY (EGD) WITH PROPOFOL;  Surgeon: Manya Silvas, MD;  Location[facility] ENDOSCOPY;  Service: Endoscopy;  Laterality: N/A;  . SAVORY DILATION N/A 12/20/2014   Procedure: SAVORY DILATION;  Surgeon: Manya Silvas, MD;  Location: [facility] ENDOSCOPY;  Service: Endoscopy;  Laterality: N/A;  . TONSILLECTOMY     Family History:  Family History  Problem Relation Age of Onset  . Diabetes Mother   . COPD Mother   . Kidney disease Mother   .  Depression Mother   . Heart attack Father   . Aneurysm Father   . Anxiety disorder Sister   . Depression Sister   . Diabetes Sister   . Hypertension Sister   . Atrial fibrillation Brother   . Spinal muscular atrophy Brother   . Breast cancer Sister   . Bone cancer Sister   . Depression Sister   . Anxiety disorder Sister   . Breast cancer Sister   . Colon cancer Sister   . Depression Sister   . Diabetes Sister   . COPD Sister   . Depression Sister   . Anxiety disorder Sister   . Diabetes Brother   . Depression Brother    Family Psychiatric  History: see above Social History:  History  Alcohol Use No     History  Drug Use No    Social History   Social History  . Marital status: Widowed    Spouse name: N/A  . Number of children: N/A  . Years of education: N/A   Social History Main Topics  . Smoking status: Never Smoker  . Smokeless tobacco: Never Used  . Alcohol use No  . Drug use: No  . Sexual activity: Not Currently    Birth control/ protection: Post-menopausal   Other Topics Concern  . None   Social History Narrative  . None   Additional Social History:                      Sleep: Fair  Appetite:  Poor  Current Medications: Current Facility-Administered Medications  Medication Dose Route Frequency Provider Last Rate Last Dose  . acetaminophen (TYLENOL) tablet 650 mg  650 mg Oral Q6H PRN Gonzella Lex, MD      . alum & mag hydroxide-simeth (MAALOX/MYLANTA) 200-200-20 MG/5ML suspension 30 mL  30 mL Oral Q4H PRN Gonzella Lex, MD      . busPIRone (BUSPAR) tablet 15 mg  15 mg Oral BID Gonzella Lex, MD   15 mg at 06/26/16 0844  . celecoxib (CELEBREX) capsule 100 mg  100 mg Oral Daily Gonzella Lex, MD   100 mg at 06/26/16 0844  . ciprofloxacin (CIPRO) tablet 250 mg  250 mg Oral BID Gonzella Lex, MD   250 mg at 06/26/16 0845  . donepezil (ARICEPT) tablet 5 mg  5 mg Oral QHS Gonzella Lex, MD   5 mg at 06/25/16 2149  . escitalopram  (LEXAPRO) tablet 10 mg  10 mg Oral QHS Gonzella Lex, MD   10 mg at 06/25/16 2149  . feeding supplement (ENSURE ENLIVE) (ENSURE ENLIVE) liquid 237 mL  237 mL Oral BID BM Himabindu Ravi, MD   237 mL at 06/25/16 1400  . fentaNYL (SUBLIMAZE) injection 25 mcg  25 mcg Intravenous Q5 min PRN Molli Barrows, MD      . fluticasone [facility]) 50 MCG/ACT nasal spray 2 spray  2 spray Each Nare Daily Gonzella Lex, MD   2 spray at 06/23/16 1140  . levothyroxine (SYNTHROID, LEVOTHROID) tablet 75 mcg  75 mcg Oral QAC breakfast Gonzella Lex, MD   75 mcg at 06/26/16 0844  . magnesium hydroxide (MILK OF MAGNESIA) suspension 30 mL  30 mL Oral Daily PRN Gonzella Lex, MD      . ondansetron [facility])  injection 4 mg  4 mg Intravenous Once PRN Molli Barrows, MD      . ondansetron [facility]) injection 4 mg  4 mg Intravenous Once PRN Gunnar Bulla, MD      . pantoprazole (PROTONIX) EC tablet 40 mg  40 mg Oral QAC breakfast Gonzella Lex, MD   40 mg at 06/26/16 0845  . QUEtiapine (SEROQUEL) tablet 100 mg  100 mg Oral QHS Himabindu Ravi, MD   100 mg at 06/25/16 2149    Lab Results:  Results for orders placed or performed during the hospital encounter of 06/20/16 (from the past 48 hour(s))  Glucose, capillary     Status: Abnormal   Collection Time: 06/25/16  6:02 AM  Result Value Ref Range   Glucose-Capillary 101 (H) 65 - 99 mg/dL    Blood Alcohol level:  Lab Results  Component Value Date   ETH <5 27/06/5007    Metabolic Disorder Labs: Lab Results  Component Value Date   HGBA1C 5.3 06/21/2016   MPG 105 06/21/2016   No results found for: PROLACTIN Lab Results  Component Value Date   CHOL 145 06/21/2016   TRIG 51 06/21/2016   HDL 44 06/21/2016   CHOLHDL 3.3 06/21/2016   VLDL 10 06/21/2016   LDLCALC 91 06/21/2016   LDLCALC 70 08/22/2013    Physical Findings: AIMS:  , ,  ,  ,    CIWA:    COWS:     Musculoskeletal: Strength & Muscle Tone: weak, some generalized atrophy due to patient limiting her  mobility Gait & Station: normal Patient leans: N/A  Psychiatric Specialty Exam: Physical Exam  Nursing note and vitals reviewed. Constitutional: She appears well-developed and well-nourished.  HENT:  Head: Normocephalic and atraumatic.  Eyes: Conjunctivae are normal. Pupils are equal, round, and reactive to light.  Neck: Normal range of motion.  Cardiovascular: Regular rhythm and normal heart sounds.   Respiratory: Effort normal. No respiratory distress.  GI: Soft.  Musculoskeletal: Normal range of motion.  Neurological: She is alert.  Skin: Skin is warm and dry.  Psychiatric: Her affect is blunt. Her speech is delayed. She is slowed. She expresses impulsivity. She expresses no suicidal ideation. She exhibits abnormal recent memory.    Review of Systems  Constitutional: Negative.   HENT: Negative.   Eyes: Negative.   Respiratory: Negative.   Cardiovascular: Negative.   Gastrointestinal: Negative.   Musculoskeletal: Negative.   Skin: Negative.   Neurological: Negative.   Psychiatric/Behavioral: Positive for depression. Negative for hallucinations, memory loss, substance abuse and suicidal ideas. The patient is nervous/anxious. The patient does not have insomnia.     Blood pressure 128/71, pulse 85, temperature 97.6 F (36.4 C), temperature source Oral, resp. rate 18, height 5' 2"  (1.575 m), weight 78.9 kg (174 lb), SpO2 95 %.Body mass index is 31.83 kg/m.  General Appearance: Casual  Eye Contact:  Fair  Speech:  Slow, soft, minimal  Volume:  Decreased  Mood:  Euthymic  Affect:  Congruent  Thought Process:  Coherent  Orientation:  Full (Time, Place, and Person)  Thought Content:  Rumination  Suicidal Thoughts:  Yes.  without intent/plan  Homicidal Thoughts:  No  Memory:  Immediate;   Fair Recent;   Fair Remote;   Fair  Judgement:  Impaired  Insight:  Lacking  Psychomotor Activity:  Psychomotor Retardation  Concentration:  Concentration: Fair and Attention Span: Fair   Recall:  [employer] of Knowledge:  Fair  Language:  Fair  Akathisia:  No  Handed:  Right  AIMS (if indicated):     Assets:  Communication Skills Housing  ADL's:  Impaired  Cognition:  WNL  Sleep:  Number of Hours: 7.3     Treatment Plan Summary: Daily contact with patient to assess and evaluate symptoms and progress in treatment and Medication management   Major Depressive Disorder Continue increased dose of Lexapro and current seroquel. Consult to ECT since patient not responding to multiple medications and decompensating.  Anxiety- Continue Buspar  Malnutrition- Nutrition consult done, recommendations being followed with ensure supplements.  Hypothyroidism- synthroid  Continue Aricept per primary care.  GERD- Pantoprazole  Labs-TSH wnl, lipid panel wnl, obtain UA  Disposition- After ECT treatment to outpatient Psychiatrist. Patient may need assisted living or nursing home based on response to ECT.  Patient will have her third ECT treatment tomorrow. She is tolerating it well and I have a feeling she is showing improvement. We will change her diet in the hopes that that will help her eat better. Patient will probably still need to be here over the weekend although I have asked Marya Amsler our social worker to see if he can contact the grandson to see if there will be arrangements possible for outpatient treatment.    Alethia Berthold, MD 06/26/2016, 5:27 PM

## 2016-06-26 NOTE — Progress Notes (Signed)
D:Patient  Walking  to and from dinning dayroom . Isolates to room  When not participating  In unit programming. Attended  Treatment Team  Unable to get the answer  She wanted . ECT tomorrow and patient  May be going home  Next week. Patient appetite  Poor and is not  Drinking the Ensure. Appropriate ADL and  Personal chores   Denies suicidal  homicidal ideations  .  No auditory hallucinations  No pain concerns .  A: Encourage patient participation with unit programming . Instruction  Given on  Medication , verbalize understanding. R: Voice no other concerns. Staff continue to monitor

## 2016-06-26 NOTE — Progress Notes (Signed)
Pt. was visible in the milieu, she was pleasant on approach and she was cooperative. She verbalized understanding of having ECT tomorrow and being NPO after midnight. She denies suicidal and homicidal ideations. She need a little help find her room at times, but had no behavioral issues to report on shift at this time.

## 2016-06-26 NOTE — Plan of Care (Signed)
Problem: Pain Managment: Goal: General experience of comfort will improve Outcome: Not Met (add Reason) Calm and cooperative. Isolative. Refused group. Medications given at the bedside. No voiced thoughts of hurting herself.  C/o pain/discomfort noted.

## 2016-06-26 NOTE — BHH Group Notes (Signed)
[place] Group Notes:  (Nursing/MHT/Case Management/Adjunct)  Date:  06/26/2016  Time:  9:36 PM  Type of Therapy:  Evening Wrap-up Group  Participation Level:  Did Not Attend  Participation Quality:  N/A  Affect:  N/A  Cognitive:  N/A  Insight:  None  Engagement in Group:  Did Not Attend  Modes of Intervention:  Discussion  Summary of Progress/Problems:  Levonne Spiller 06/26/2016, 9:36 PM

## 2016-06-26 NOTE — BHH Group Notes (Signed)
[place] Group Notes:  (Nursing/MHT/Case Management/Adjunct)  Date:  06/26/2016  Time:  4:29 PM  Type of Therapy:  Psychoeducational Skills  Participation Level:  Did Not Attend   Shuntia Exton De'Chelle Randen Kauth 06/26/2016, 4:29 PM

## 2016-06-27 ENCOUNTER — Inpatient Hospital Stay: Payer: Medicare Other | Admitting: Anesthesiology

## 2016-06-27 LAB — GLUCOSE, CAPILLARY: GLUCOSE-CAPILLARY: 96 mg/dL (ref 65–99)

## 2016-06-27 MED ORDER — METHOHEXITAL SODIUM 100 MG/10ML IV SOSY
PREFILLED_SYRINGE | INTRAVENOUS | Status: DC | PRN
  Administered 2016-06-27: 80 mg via INTRAVENOUS

## 2016-06-27 MED ORDER — SODIUM CHLORIDE 0.9 % IV SOLN
INTRAVENOUS | Status: DC | PRN
  Administered 2016-06-27: 09:00:00 via INTRAVENOUS

## 2016-06-27 MED ORDER — LABETALOL HCL 5 MG/ML IV SOLN
INTRAVENOUS | Status: DC | PRN
  Administered 2016-06-27: 10 mg via INTRAVENOUS

## 2016-06-27 MED ORDER — SUCCINYLCHOLINE CHLORIDE 20 MG/ML IJ SOLN
INTRAMUSCULAR | Status: DC | PRN
  Administered 2016-06-27: 80 mg via INTRAVENOUS

## 2016-06-27 MED ORDER — SODIUM CHLORIDE 0.9 % IV SOLN
500.0000 mL | Freq: Once | INTRAVENOUS | Status: AC
  Administered 2016-06-27: 1000 mL via INTRAVENOUS

## 2016-06-27 NOTE — Transfer of Care (Signed)
Immediate Anesthesia Transfer of Care Note  Patient: Cassidy [name]  Procedure(s) Performed: * No procedures listed *  Patient Location: PACU  Anesthesia Type:General  Level of Consciousness: awake and responds to stimulation  Airway & Oxygen Therapy: Patient Spontanous Breathing and Patient connected to face mask oxygen  Post-op Assessment: Report given to RN and Post -op Vital signs reviewed and stable  Post vital signs: Reviewed and stable  Last Vitals:  Vitals:   06/27/16 1043 06/27/16 1044  BP: (!) 145/79 (!) 145/79  Pulse: (!) 105 (!) 106  Resp: 20 14  Temp: 36.6 C     Last Pain:  Vitals:   06/27/16 1043  TempSrc:   PainSc: 0-No pain         Complications: No apparent anesthesia complications

## 2016-06-27 NOTE — Anesthesia Procedure Notes (Signed)
Performed by: Lance Muss Pre-anesthesia Checklist: Patient identified, Emergency Drugs available, Suction available and Patient being monitored Patient Re-evaluated:Patient Re-evaluated prior to inductionOxygen Delivery Method: Circle system utilized Preoxygenation: Pre-oxygenation with 100% oxygen Intubation Type: IV induction Ventilation: Mask ventilation without difficulty and Mask ventilation throughout procedure Airway Equipment and Method: Bite block Placement Confirmation: positive ETCO2 Dental Injury: Teeth and Oropharynx as per pre-operative assessment

## 2016-06-27 NOTE — Progress Notes (Signed)
Recreation Therapy Notes  Date: 03.09.18 Time: 9:30 am Location: Craft Room  Group Topic: Coping Skills  Goal Area(s) Addresses:  Patient will participate in healthy coping skill. Patient will verbalize benefit of using art as a coping skill.  Behavioral Response: Did not attend  Intervention: Coloring  Activity: Patients were given coloring sheets to color and were instructed to think about the emotion they were feeling and what their minds were focused on.  Education: LRT educated patients on healthy coping skills.  Education Outcome: Patient did not attend group.  Clinical Observations/Feedback: Patient was in ECT treatment during group.  Leonette Monarch, LRT/CTRS 06/27/2016 10:28 AM

## 2016-06-27 NOTE — H&P (Signed)
Cassidy [name] is an 76 y.o. female.   Chief Complaint: Patient continues to have depression low energy lack of motivation HPI: Major depression SEVERE MEDICATION CURRENTLY IN [place] ECT.  Past Medical History:  Diagnosis Date  . Anxiety   . Depression   . Fatty liver   . GERD (gastroesophageal reflux disease)   . Hypothyroidism   . Obesity   . Thyroid disease     Past Surgical History:  Procedure Laterality Date  . APPENDECTOMY    . ESOPHAGOGASTRODUODENOSCOPY (EGD) WITH PROPOFOL N/A 12/20/2014   Procedure: ESOPHAGOGASTRODUODENOSCOPY (EGD) WITH PROPOFOL;  Surgeon: Manya Silvas, MD;  Location: [facility] ENDOSCOPY;  Service: Endoscopy;  Laterality: N/A;  . SAVORY DILATION N/A 12/20/2014   Procedure: SAVORY DILATION;  Surgeon: Manya Silvas, MD;  Location: [facility] ENDOSCOPY;  Service: Endoscopy;  Laterality: N/A;  . TONSILLECTOMY      Family History  Problem Relation Age of Onset  . Diabetes Mother   . COPD Mother   . Kidney disease Mother   . Depression Mother   . Heart attack Father   . Aneurysm Father   . Anxiety disorder Sister   . Depression Sister   . Diabetes Sister   . Hypertension Sister   . Atrial fibrillation Brother   . Spinal muscular atrophy Brother   . Breast cancer Sister   . Bone cancer Sister   . Depression Sister   . Anxiety disorder Sister   . Breast cancer Sister   . Colon cancer Sister   . Depression Sister   . Diabetes Sister   . COPD Sister   . Depression Sister   . Anxiety disorder Sister   . Diabetes Brother   . Depression Brother    Social History:  reports that she has never smoked. She has never used smokeless tobacco. She reports that she does not drink alcohol or use drugs.  Allergies:  Allergies  Allergen Reactions  . Sulfa Antibiotics Shortness Of Breath    Medications Prior to Admission  Medication Sig Dispense Refill  . busPIRone (BUSPAR) 15 MG tablet Take 1 tablet (15 mg total) by mouth 2 (two) times daily.  180 tablet 0  . celecoxib (CELEBREX) 100 MG capsule TAKE ONE CAPSULE BY MOUTH TWICE A DAY    . donepezil (ARICEPT) 5 MG tablet Take 5 mg by mouth at bedtime.     Marland Kitchen escitalopram (LEXAPRO) 5 MG tablet Take 1 tablet (5 mg total) by mouth at bedtime. (Patient taking differently: Take 10 mg by mouth at bedtime. ) 30 tablet 3  . fluticasone (FLONASE) 50 MCG/ACT nasal spray Place 1 spray into both nostrils daily.    . hydrOXYzine (VISTARIL) 25 MG capsule Take 1 capsule (25 mg total) by mouth 3 (three) times daily as needed. 30 capsule 0  . KLOR-CON 10 10 MEQ tablet Take 10 mEq by mouth daily.    Marland Kitchen levothyroxine (SYNTHROID, LEVOTHROID) 75 MCG tablet Take 75 mcg by mouth daily before breakfast.    . omeprazole (PRILOSEC) 20 MG capsule Take 20 mg by mouth 2 (two) times daily.    . QUEtiapine (SEROQUEL) 100 MG tablet Take 1 tablet (100 mg total) by mouth at bedtime. (Patient taking differently: Take 400 mg by mouth at bedtime. ) 30 tablet 3  . traZODone (DESYREL) 100 MG tablet Take 1 tablet (100 mg total) by mouth at bedtime. 30 tablet 3    Results for orders placed or performed during the hospital encounter of 06/20/16 (from  the past 48 hour(s))  Glucose, capillary     Status: None   Collection Time: 06/27/16  6:45 AM  Result Value Ref Range   Glucose-Capillary 96 65 - 99 mg/dL   No results found.  Review of Systems  Constitutional: Negative.   HENT: Negative.   Eyes: Negative.   Respiratory: Negative.   Cardiovascular: Negative.   Gastrointestinal: Negative.   Musculoskeletal: Negative.   Skin: Negative.   Neurological: Negative.   Psychiatric/Behavioral: Positive for depression. Negative for hallucinations, memory loss, substance abuse and suicidal ideas. The patient is not nervous/anxious and does not have insomnia.     Blood pressure (!) 148/68, pulse 84, temperature 98 F (36.7 C), temperature source Oral, resp. rate 18, height 5' 2"  (1.575 m), weight 78.5 kg (173 lb), SpO2 95  %. Physical Exam  Nursing note and vitals reviewed. Constitutional: She appears well-developed and well-nourished.  HENT:  Head: Normocephalic and atraumatic.  Eyes: Conjunctivae are normal. Pupils are equal, round, and reactive to light.  Neck: Normal range of motion.  Cardiovascular: Regular rhythm and normal heart sounds.   Respiratory: Effort normal. No respiratory distress.  GI: Soft.  Musculoskeletal: Normal range of motion.  Neurological: She is alert.  Skin: Skin is warm and dry.  Psychiatric: Her mood appears anxious. Her speech is delayed. She is slowed. Cognition and memory are normal. She expresses inappropriate judgment. She exhibits a depressed mood. She expresses no suicidal ideation.     Assessment/Plan Treatment today continue into next week likely inpatient for the time being  Alethia Berthold, MD 06/27/2016, 10:21 AM

## 2016-06-27 NOTE — Procedures (Signed)
ECT SERVICES Physician's Interval Evaluation & Treatment Note  Patient Identification: Cassidy [name] MRN:  [id number] Date of Evaluation:  06/27/2016 TX #: 3  MADRS:   MMSE:   P.E. Findings:  Heart and lungs normal. Vitals unremarkable. Energy slightly improved  Psychiatric Interval Note:  Still down and depressed although yesterday she was looking a little more interactive  Subjective:  Patient is a 76 y.o. female seen for evaluation for Electroconvulsive Therapy. Lack of motivation  Treatment Summary:   [x]   Right Unilateral             []  Bilateral   % Energy : 0.69m, 80%   Impedance: 2060 ohms  Seizure Energy Index: 5638 V squared  Postictal Suppression Index: Less than 10%  Seizure Concordance Index: 60%  Medications  Pre Shock: Brevital 80 mg succinylcholine 80 mg  Post Shock:    Seizure Duration: 27 seconds by EMG, computer read 41 seconds EEG I think it's probably more like 51 seconds   Comments: Continue treatment next week. Likely to remain inpatient at least through the first one or 2 treatments next week   Lungs:  [x]   Clear to auscultation               []  Other:   Heart:    [x]   Regular rhythm             []  irregular rhythm    [x]   Previous H&P reviewed, patient examined and there are NO CHANGES                 []   Previous H&P reviewed, patient examined and there are changes noted.   JAlethia Berthold MD 3/9/201810:22 AM

## 2016-06-27 NOTE — Progress Notes (Signed)
Recreation Therapy Notes  (Late Entry for 03.08.18)  Date: 03.08.18 Time: 9:30 am Location: Craft Room  Group Topic: Leisure Education  Goal Area(s) Addresses:  Patient will identify things they are grateful for. Patient will identify how being grateful can influence decision-making.  Behavioral Response: Attentive, Interactive  Intervention: Grateful Wheel  Activity: Patients were given an I Am Grateful For worksheet and were instructed to write things they are grateful for under each category.  Education: LRT educated patients on leisure.  Education Outcome: In group clarification offered  Clinical Observations/Feedback: Patient wrote things she is grateful for. Patient contributed to group discussion by stating things she is grateful for.  Leonette Monarch, LRT/CTRS 06/27/2016 8:07 AM

## 2016-06-27 NOTE — Anesthesia Postprocedure Evaluation (Signed)
Anesthesia Post Note  Patient: Cassidy [name]  Procedure(s) Performed: * No procedures listed *  Patient location during evaluation: PACU Anesthesia Type: General Level of consciousness: awake and alert Pain management: pain level controlled Vital Signs Assessment: post-procedure vital signs reviewed and stable Respiratory status: spontaneous breathing, nonlabored ventilation, respiratory function stable and patient connected to nasal cannula oxygen Cardiovascular status: blood pressure returned to baseline and stable Postop Assessment: no signs of nausea or vomiting Anesthetic complications: no     Last Vitals:  Vitals:   06/27/16 1104 06/27/16 1113  BP: 94/76 116/74  Pulse: 82 84  Resp: (!) 21 20  Temp:  36.6 C    Last Pain:  Vitals:   06/27/16 1113  TempSrc:   PainSc: 0-No pain                 Precious Haws Millianna Szymborski

## 2016-06-27 NOTE — Progress Notes (Signed)
[facility] MD Progress Note  06/27/2016 6:59 PM Cassidy [name]  MRN:  [id number] Subjective:Patient is a 76 yo woman with severe depression , decompensating over past few months with poor nutrition and not responsive to multiple medication changes, seen by outpatient psychiatrist at [facility]. Admitted for stabilization .  Patient reports being unhappy being in the hospital. Per her sister she has not showered in months.She was initially resistant but showered with help of tech. She has not been eating, consumption less than 10 % of her meals. She is compliant with medications. Minimal participation in groups. Continues to say that her grandson is the only one keeping her from dying.  Follow-up for Friday the ninth. Patient had third ECT treatment today which once again went well and appeared to be well tolerated. Patient continues to irritably focused on discharge plans. Social work spoke with her grandson today who is strongly supportive of her continued hospitalization. Family will be coming in over the weekend. Patient still spends too much time in bed for my liking although she says that she is feeling better.  Principal Problem: Severe recurrent major depression without psychotic features ([place]) Diagnosis:   Patient Active Problem List   Diagnosis Date Noted  . Severe recurrent major depression without psychotic features ([place]) [F33.2] 06/19/2016  . Hepatic encephalopathy ([place]) [K72.90] 08/13/2015  . Thrombocytopenia ([place]) [D69.6] 07/23/2015  . Amnesia [R41.3] 03/20/2015  . Major depression in remission ([place]) [F32.5] 03/12/2015  . Gonalgia [M25.569] 01/05/2015  . Absolute anemia [D64.9] 11/27/2014  . Arthritis [M19.90] 11/27/2014  . Acid reflux [K21.9] 11/27/2014  . Depression, major, recurrent, in partial remission ([place]) [F33.41] 11/27/2014  . Encounter for general adult medical examination without abnormal findings [Z00.00] 08/25/2014  . Chronic LBP [M54.5, G89.29] 08/15/2014  . 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 Infraspinatus tenosynovitis [M65.819] 05/01/2014  . Other synovitis and tenosynovitis, right shoulder [M65.811] 05/01/2014  . Difficulty in walking [R26.2] 11/30/2013  . Extreme obesity ([place]) [E66.01] 11/30/2013  . Morbid obesity ([place]) [E66.01] 11/30/2013  . Arthritis, degenerative [M19.90] 10/01/2013  . Steatohepatitis [K75.81] 10/01/2013  . Orthostasis [I95.1] 10/01/2013  . Appendicular ataxia [R27.0] 09/26/2013  . Fall [W19.XXXA] 09/26/2013  . Dizziness [R42] 09/07/2013  . Osteoporosis with fracture [M80.00XA] 09/06/2013  . Clinical depression [F32.9] 03/25/2012  . Adult hypothyroidism [E03.9] 03/25/2012  . Esophageal stenosis [K22.2] 07/03/2009  . Back ache [M54.9] 03/08/2003  . Anxiety state [F41.1] 12/27/2002   Total Time spent with patient: 20 minutes  Past Psychiatric History: Patient has had prior psychiatric admissions here at our hospital before. Denies suicide attempts. Patient has been on multiple antidepressants without consistent benefit. She is currently on Lexapro and BuSpar and Seroquel  Past Medical History:  Past Medical History:  Diagnosis Date  . Anxiety   . Depression   . Fatty liver   . GERD (gastroesophageal reflux disease)   . Hypothyroidism   . Obesity   . Thyroid disease     Past Surgical History:  Procedure Laterality Date  . APPENDECTOMY    . ESOPHAGOGASTRODUODENOSCOPY (EGD) WITH PROPOFOL N/A 12/20/2014   Procedure: ESOPHAGOGASTRODUODENOSCOPY (EGD) WITH PROPOFOL;  Surgeon: Manya Silvas, MD;  Location: [facility] ENDOSCOPY;  Service: Endoscopy;  Laterality: N/A;  . SAVORY DILATION N/A 12/20/2014   Procedure: SAVORY DILATION;  Surgeon: Manya Silvas, MD;  Location: [facility] ENDOSCOPY;  Service: Endoscopy;  Laterality: N/A;  . TONSILLECTOMY     Family History:  Family History  Problem Relation Age of Onset  .  Diabetes Mother   . COPD Mother   . Kidney disease Mother   . Depression Mother   . Heart  attack Father   . Aneurysm Father   . Anxiety disorder Sister   . Depression Sister   . Diabetes Sister   . Hypertension Sister   . Atrial fibrillation Brother   . Spinal muscular atrophy Brother   . Breast cancer Sister   . Bone cancer Sister   . Depression Sister   . Anxiety disorder Sister   . Breast cancer Sister   . Colon cancer Sister   . Depression Sister   . Diabetes Sister   . COPD Sister   . Depression Sister   . Anxiety disorder Sister   . Diabetes Brother   . Depression Brother    Family Psychiatric  History: see above Social History:  History  Alcohol Use No     History  Drug Use No    Social History   Social History  . Marital status: Widowed    Spouse name: N/A  . Number of children: N/A  . Years of education: N/A   Social History Main Topics  . Smoking status: Never Smoker  . Smokeless tobacco: Never Used  . Alcohol use No  . Drug use: No  . Sexual activity: Not Currently    Birth control/ protection: Post-menopausal   Other Topics Concern  . None   Social History Narrative  . None   Additional Social History:                      Sleep: Fair  Appetite:  Poor  Current Medications: Current Facility-Administered Medications  Medication Dose Route Frequency Provider Last Rate Last Dose  . acetaminophen (TYLENOL) tablet 650 mg  650 mg Oral Q6H PRN Gonzella Lex, MD      . alum & mag hydroxide-simeth (MAALOX/MYLANTA) 200-200-20 MG/5ML suspension 30 mL  30 mL Oral Q4H PRN Gonzella Lex, MD      . busPIRone (BUSPAR) tablet 15 mg  15 mg Oral BID Gonzella Lex, MD   15 mg at 06/27/16 1655  . celecoxib (CELEBREX) capsule 100 mg  100 mg Oral Daily Gonzella Lex, MD   100 mg at 06/27/16 1148  . donepezil (ARICEPT) tablet 5 mg  5 mg Oral QHS Gonzella Lex, MD   5 mg at 06/26/16 2135  . escitalopram (LEXAPRO) tablet 10 mg  10 mg Oral QHS Gonzella Lex, MD   10 mg at 06/26/16 2135  . feeding supplement (ENSURE ENLIVE) (ENSURE ENLIVE)  liquid 237 mL  237 mL Oral BID BM Himabindu Ravi, MD   237 mL at 06/27/16 1400  . fentaNYL (SUBLIMAZE) injection 25 mcg  25 mcg Intravenous Q5 min PRN Molli Barrows, MD      . fluticasone [facility]) 50 MCG/ACT nasal spray 2 spray  2 spray Each Nare Daily Gonzella Lex, MD   2 spray at 06/27/16 [phone]  . levothyroxine (SYNTHROID, LEVOTHROID) tablet 75 mcg  75 mcg Oral QAC breakfast Gonzella Lex, MD   75 mcg at 06/27/16 1149  . magnesium hydroxide (MILK OF MAGNESIA) suspension 30 mL  30 mL Oral Daily PRN Gonzella Lex, MD      . ondansetron [facility]) injection 4 mg  4 mg Intravenous Once PRN Molli Barrows, MD      . ondansetron [facility]) injection 4 mg  4 mg Intravenous Once PRN Gunnar Bulla, MD      .  pantoprazole (PROTONIX) EC tablet 40 mg  40 mg Oral QAC breakfast Gonzella Lex, MD   40 mg at 06/27/16 9741  . QUEtiapine (SEROQUEL) tablet 100 mg  100 mg Oral QHS Himabindu Ravi, MD   100 mg at 06/26/16 2136    Lab Results:  Results for orders placed or performed during the hospital encounter of 06/20/16 (from the past 48 hour(s))  Glucose, capillary     Status: None   Collection Time: 06/27/16  6:45 AM  Result Value Ref Range   Glucose-Capillary 96 65 - 99 mg/dL    Blood Alcohol level:  Lab Results  Component Value Date   ETH <5 63/84/5364    Metabolic Disorder Labs: Lab Results  Component Value Date   HGBA1C 5.3 06/21/2016   MPG 105 06/21/2016   No results found for: PROLACTIN Lab Results  Component Value Date   CHOL 145 06/21/2016   TRIG 51 06/21/2016   HDL 44 06/21/2016   CHOLHDL 3.3 06/21/2016   VLDL 10 06/21/2016   LDLCALC 91 06/21/2016   LDLCALC 70 08/22/2013    Physical Findings: AIMS:  , ,  ,  ,    CIWA:    COWS:     Musculoskeletal: Strength & Muscle Tone: weak, some generalized atrophy due to patient limiting her mobility Gait & Station: normal Patient leans: N/A  Psychiatric Specialty Exam: Physical Exam  Nursing note and vitals  reviewed. Constitutional: She appears well-developed and well-nourished.  HENT:  Head: Normocephalic and atraumatic.  Eyes: Conjunctivae are normal. Pupils are equal, round, and reactive to light.  Neck: Normal range of motion.  Cardiovascular: Regular rhythm and normal heart sounds.   Respiratory: Effort normal. No respiratory distress.  GI: Soft.  Musculoskeletal: Normal range of motion.  Neurological: She is alert.  Skin: Skin is warm and dry.  Psychiatric: Her affect is blunt. Her speech is delayed. She is slowed. She expresses impulsivity. She expresses no suicidal ideation. She exhibits abnormal recent memory.    Review of Systems  Constitutional: Negative.   HENT: Negative.   Eyes: Negative.   Respiratory: Negative.   Cardiovascular: Negative.   Gastrointestinal: Negative.   Musculoskeletal: Negative.   Skin: Negative.   Neurological: Negative.   Psychiatric/Behavioral: Positive for depression. Negative for hallucinations, memory loss, substance abuse and suicidal ideas. The patient is nervous/anxious. The patient does not have insomnia.     Blood pressure (!) 119/57, pulse 83, temperature 98.1 F (36.7 C), temperature source Oral, resp. rate 18, height 5' 2"  (1.575 m), weight 78.5 kg (173 lb), SpO2 92 %.Body mass index is 31.64 kg/m.  General Appearance: Casual  Eye Contact:  Fair  Speech:  Slow, soft, minimal  Volume:  Decreased  Mood:  Euthymic  Affect:  Congruent  Thought Process:  Coherent  Orientation:  Full (Time, Place, and Person)  Thought Content:  Rumination  Suicidal Thoughts:  Yes.  without intent/plan  Homicidal Thoughts:  No  Memory:  Immediate;   Fair Recent;   Fair Remote;   Fair  Judgement:  Impaired  Insight:  Lacking  Psychomotor Activity:  Psychomotor Retardation  Concentration:  Concentration: Fair and Attention Span: Fair  Recall:  [employer] of Knowledge:  Fair  Language:  Fair  Akathisia:  No  Handed:  Right  AIMS (if indicated):      Assets:  Communication Skills Housing  ADL's:  Impaired  Cognition:  WNL  Sleep:  Number of Hours: 8.5     Treatment Plan  Summary: Daily contact with patient to assess and evaluate symptoms and progress in treatment and Medication management   Major Depressive Disorder Continue increased dose of Lexapro and current seroquel. Consult to ECT since patient not responding to multiple medications and decompensating.  Anxiety- Continue Buspar  Malnutrition- Nutrition consult done, recommendations being followed with ensure supplements.  Hypothyroidism- synthroid  Continue Aricept per primary care.  GERD- Pantoprazole  Labs-TSH wnl, lipid panel wnl, obtain UA  Disposition- After ECT treatment to outpatient Psychiatrist. Patient may need assisted living or nursing home based on response to ECT.  Continue ECT 3 times a week into next week. So far appears to be tolerating it well and we are hoping to start seeing some clear improvement. No change to medication. Explained to patient the rationale for continued hospitalization and encouraged her to be as active as possible and to eat well.    Alethia Berthold, MD 06/27/2016, 6:59 PM

## 2016-06-27 NOTE — Plan of Care (Signed)
Problem: Activity: Goal: Interest or engagement in leisure activities will improve Outcome: Progressing Pt out in milieu more

## 2016-06-27 NOTE — Progress Notes (Signed)
Patient pleasant and cooperative with care. Med and group compliant. Appropriate with staff and peers. Denies SI, HI, AVH. Endorses sadness but reports is improving. Pt eating meals in dayroom with peers. Visible in milieu more.  Encouragement and support offered. Encouraged patient to eat meals and drink supplements. Pt receptive. Remains safe on unit with q 15 min checks.

## 2016-06-27 NOTE — BHH Group Notes (Signed)
[place] LCSW Group Therapy Note  Date/Time: 06/27/16, 1300  Type of Therapy and Topic:  Group Therapy:  Feelings around Relapse and Recovery  Participation Level:  Did Not Attend   Mood:  Description of Group:    Patients in this group will discuss emotions they experience before and after a relapse. They will process how experiencing these feelings, or avoidance of experiencing them, relates to having a relapse. Facilitator will guide patients to explore emotions they have related to recovery. Patients will be encouraged to process which emotions are more powerful. They will be guided to discuss the emotional reaction significant others in their lives may have to patients' relapse or recovery. Patients will be assisted in exploring ways to respond to the emotions of others without this contributing to a relapse.  Therapeutic Goals: 1. Patient will identify two or more emotions that lead to relapse for them:  2. Patient will identify two emotions that result when they relapse:  3. Patient will identify two emotions related to recovery:  4. Patient will demonstrate ability to communicate their needs through discussion and/or role plays.   Summary of Patient Progress:     Therapeutic Modalities:   Cognitive Behavioral Therapy Solution-Focused Therapy Assertiveness Training Relapse Prevention Therapy  Lurline Idol, LCSW

## 2016-06-27 NOTE — Pre-Procedure Instructions (Signed)
Behavior med called with report.

## 2016-06-27 NOTE — Anesthesia Post-op Follow-up Note (Cosign Needed)
Anesthesia QCDR form completed.        

## 2016-06-27 NOTE — Plan of Care (Signed)
Problem: [facility] Participation in Recreation Therapeutic Interventions Goal: STG-Other Recreation Therapy Goal (Specify) STG: Stress Management - Within 4 treatment sessions, patient will verbalize understanding of the stress management techniques in each of 2 treatment sessions to increase stress management skills.  Outcome: Completed/Met Date Met: 06/27/16 Treatment Session 2; Completed 2 out of 2: At approximately 3:45 pm, LRT met with patient in patient room. Patient verbalized understanding of the stress management techniques. LRT encouraged patient to use the techniques to help her manage her stress.  Leonette Monarch, LRT/CTRS 03.09.18 4:25 pm

## 2016-06-27 NOTE — Anesthesia Preprocedure Evaluation (Signed)
Anesthesia Evaluation  Patient identified by MRN, date of birth, ID band Patient awake    Reviewed: Allergy & Precautions, NPO status , Patient's Chart, lab work & pertinent test results, reviewed documented beta blocker date and time   Airway Mallampati: II  TM Distance: >3 FB Neck ROM: limited    Dental  (+) Chipped, Poor Dentition, Missing   Pulmonary           Cardiovascular      Neuro/Psych PSYCHIATRIC DISORDERS Anxiety Depression    GI/Hepatic GERD  Controlled,(+) Hepatitis -  Endo/Other  Hypothyroidism   Renal/GU      Musculoskeletal  (+) Arthritis ,   Abdominal   Peds  Hematology  (+) Sickle cell anemia and anemia ,   Anesthesia Other Findings Past Medical History: No date: Anxiety No date: Depression No date: Fatty liver No date: GERD (gastroesophageal reflux disease) No date: Hypothyroidism No date: Obesity No date: Thyroid disease   Reproductive/Obstetrics                             Anesthesia Physical  Anesthesia Plan  ASA: III  Anesthesia Plan: General   Post-op Pain Management:    Induction: Intravenous  Airway Management Planned: Mask  Additional Equipment:   Intra-op Plan:   Post-operative Plan:   Informed Consent: I have reviewed the patients History and Physical, chart, labs and discussed the procedure including the risks, benefits and alternatives for the proposed anesthesia with the patient or authorized representative who has indicated his/her understanding and acceptance.     Plan Discussed with: CRNA  Anesthesia Plan Comments:         Anesthesia Quick Evaluation

## 2016-06-28 NOTE — [facility] Group Notes (Signed)
[place] LCSW Group Therapy  06/28/2016 2:15 PM  Type of Therapy:  Group Therapy  Participation Level:  Patient did not attend group. CSW invited patient to group.   Summary of Progress/Problems: Goal Setting: The objective is to set goals as they relate to the crisis in which they were admitted. Patients will be using SMART goal modalities to set measurable goals.  Characteristics of realistic goals will be discussed and patients will be assisted in setting and processing how one will reach their goal. Facilitator will also assist patients in applying interventions and coping skills learned in psycho-education groups to the SMART goal and process how one will achieve defined goal.  Lleyton Byers G. [place], [place] 06/28/2016, 2:20 PM

## 2016-06-28 NOTE — Progress Notes (Signed)
Denies SI/HI/AVH. Minimal interaction with staff or peers. Stated it was "too loud in dayroom". Pt isolated to room majority of shift. Medication compliant. Denis pain. Voices no additional concerns at this time. Safety maintained. Will continue to monitor.

## 2016-06-28 NOTE — Plan of Care (Signed)
Problem: Nutrition: Goal: Adequate nutrition will be maintained Outcome: Not Progressing Encourage  Patient  To eat her meals  , Ensure offered  Between  Meals

## 2016-06-28 NOTE — Progress Notes (Signed)
[facility] MD Progress Note  06/28/2016 9:47 AM Cassidy [name]  MRN:  [id number] Subjective:Patient is a 76 yo woman with severe depression , decompensating over past few months with poor nutrition and not responsive to multi ple medication changes, seen by outpatient psychiatrist at [facility]. Admitted for stabilization .   Patient reports being unhappy being in the hospital. Per her sister she has not showered in months.She was initially resistant but showered with help of tech. She has not been eating, consumption less than 10 % of her meals. She is compliant with medications. Minimal participation in groups. Continues to say that her grandson is the only one keeping her from dying.  Follow-up for Friday the ninth. Patient had third ECT treatment today which once again went well and appeared to be well tolerated. Patient continues to irritably focused on discharge plans. Social work spoke with her grandson today who is strongly supportive of her continued hospitalization. Family will be coming in over the weekend. Patient still spends too much time in bed for my liking although she says that she is feeling better.  3/10 she says she is unhappy because she is still in the hospital. She says she wants to go home. She grades her mood as an 8 out of 10 x 10 being the best. Says that she has been eating and has been attending groups. She denies suicidality or homicidality. She also denies having any hallucinations. She denies having any side effects from medications. She denies any physical complaints. He states that ECT has been helping her. She was encouraged to discuss discharge with her primary psychiatrist on Monday  Per staff she is isolating herself in the room  Per nursing: Denies SI/HI/AVH. Minimal interaction with staff or peers. Stated it was "too loud in dayroom". Pt isolated to room majority of shift. Medication compliant. Denis pain. Voices no additional concerns at this time. Safety maintained. Will  continue to monitor.  Patient pleasant and cooperative with care. Med and group compliant. Appropriate with staff and peers. Denies SI, HI, AVH. Endorses sadness but reports is improving. Pt eating meals in dayroom with peers. Visible in milieu more.  Encouragement and support offered. Encouraged patient to eat meals and drink supplements. Pt receptive. Remains safe on unit with q 15 min checks.  Principal Problem: Severe recurrent major depression without psychotic features ([place]) Diagnosis:   Patient Active Problem List   Diagnosis Date Noted  . Severe recurrent major depression without psychotic features ([place]) [F33.2] 06/19/2016  . Hepatic encephalopathy ([place]) [K72.90] 08/13/2015  . Thrombocytopenia ([place]) [D69.6] 07/23/2015  . Amnesia [R41.3] 03/20/2015  . Major depression in remission ([place]) [F32.5] 03/12/2015  . Gonalgia [M25.569] 01/05/2015  . Absolute anemia [D64.9] 11/27/2014  . Arthritis [M19.90] 11/27/2014  . Acid reflux [K21.9] 11/27/2014  . Depression, major, recurrent, in partial remission ([place]) [F33.41] 11/27/2014  . Encounter for general adult medical examination without abnormal findings [Z00.00] 08/25/2014  . Chronic LBP [M54.5, G89.29] 08/15/2014  . 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 Infraspinatus tenosynovitis [M65.819] 05/01/2014  . Other synovitis and tenosynovitis, right shoulder [M65.811] 05/01/2014  . Difficulty in walking [R26.2] 11/30/2013  . Extreme obesity ([place]) [E66.01] 11/30/2013  . Morbid obesity ([place]) [E66.01] 11/30/2013  . Arthritis, degenerative [M19.90] 10/01/2013  . Steatohepatitis [K75.81] 10/01/2013  . Orthostasis [I95.1] 10/01/2013  . Appendicular ataxia [R27.0] 09/26/2013  . Fall [W19.XXXA] 09/26/2013  . Dizziness [R42] 09/07/2013  . Osteoporosis with fracture [M80.00XA] 09/06/2013  . Clinical depression [F32.9]  03/25/2012  . Adult hypothyroidism [E03.9] 03/25/2012  . Esophageal stenosis [K22.2] 07/03/2009   . Back ache [M54.9] 03/08/2003  . Anxiety state [F41.1] 12/27/2002   Total Time spent with patient: 20 minutes  Past Psychiatric History: Patient has had prior psychiatric admissions here at our hospital before. Denies suicide attempts. Patient has been on multiple antidepressants without consistent benefit. She is currently on Lexapro and BuSpar and Seroquel  Past Medical History:  Past Medical History:  Diagnosis Date  . Anxiety   . Depression   . Fatty liver   . GERD (gastroesophageal reflux disease)   . Hypothyroidism   . Obesity   . Thyroid disease     Past Surgical History:  Procedure Laterality Date  . APPENDECTOMY    . ESOPHAGOGASTRODUODENOSCOPY (EGD) WITH PROPOFOL N/A 12/20/2014   Procedure: ESOPHAGOGASTRODUODENOSCOPY (EGD) WITH PROPOFOL;  Surgeon: Manya Silvas, MD;  Location[facility] ENDOSCOPY;  Service: Endoscopy;  Laterality: N/A;  . SAVORY DILATION N/A 12/20/2014   Procedure: SAVORY DILATION;  Surgeon: Manya Silvas, MD;  Location[facility] ENDOSCOPY;  Service: Endoscopy;  Laterality: N/A;  . TONSILLECTOMY     Family History:  Family History  Problem Relation Age of Onset  . Diabetes Mother   . COPD Mother   . Kidney disease Mother   . Depression Mother   . Heart attack Father   . Aneurysm Father   . Anxiety disorder Sister   . Depression Sister   . Diabetes Sister   . Hypertension Sister   . Atrial fibrillation Brother   . Spinal muscular atrophy Brother   . Breast cancer Sister   . Bone cancer Sister   . Depression Sister   . Anxiety disorder Sister   . Breast cancer Sister   . Colon cancer Sister   . Depression Sister   . Diabetes Sister   . COPD Sister   . Depression Sister   . Anxiety disorder Sister   . Diabetes Brother   . Depression Brother    Family Psychiatric  History: see above Social History:  History  Alcohol Use No     History  Drug Use No    Social History   Social History  . Marital status: Widowed    Spouse name: N/A   . Number of children: N/A  . Years of education: N/A   Social History Main Topics  . Smoking status: Never Smoker  . Smokeless tobacco: Never Used  . Alcohol use No  . Drug use: No  . Sexual activity: Not Currently    Birth control/ protection: Post-menopausal   Other Topics Concern  . None   Social History Narrative  . None   Additional Social History:                      Sleep: Good  Appetite:  Poor  Current Medications: Current Facility-Administered Medications  Medication Dose Route Frequency Provider Last Rate Last Dose  . acetaminophen (TYLENOL) tablet 650 mg  650 mg Oral Q6H PRN Gonzella Lex, MD      . alum & mag hydroxide-simeth (MAALOX/MYLANTA) 200-200-20 MG/5ML suspension 30 mL  30 mL Oral Q4H PRN Gonzella Lex, MD      . busPIRone (BUSPAR) tablet 15 mg  15 mg Oral BID Gonzella Lex, MD   15 mg at 06/28/16 0853  . celecoxib (CELEBREX) capsule 100 mg  100 mg Oral Daily Gonzella Lex, MD   100 mg at 06/28/16 0853  .  donepezil (ARICEPT) tablet 5 mg  5 mg Oral QHS Gonzella Lex, MD   5 mg at 06/27/16 2156  . escitalopram (LEXAPRO) tablet 10 mg  10 mg Oral QHS Gonzella Lex, MD   10 mg at 06/27/16 2156  . feeding supplement (ENSURE ENLIVE) (ENSURE ENLIVE) liquid 237 mL  237 mL Oral BID BM Himabindu Ravi, MD   237 mL at 06/27/16 1400  . fentaNYL (SUBLIMAZE) injection 25 mcg  25 mcg Intravenous Q5 min PRN Molli Barrows, MD      . fluticasone [facility]) 50 MCG/ACT nasal spray 2 spray  2 spray Each Nare Daily Gonzella Lex, MD   2 spray at 06/28/16 [phone]  . levothyroxine (SYNTHROID, LEVOTHROID) tablet 75 mcg  75 mcg Oral QAC breakfast Gonzella Lex, MD   75 mcg at 06/28/16 0853  . magnesium hydroxide (MILK OF MAGNESIA) suspension 30 mL  30 mL Oral Daily PRN Gonzella Lex, MD      . ondansetron [facility]) injection 4 mg  4 mg Intravenous Once PRN Molli Barrows, MD      . ondansetron [facility]) injection 4 mg  4 mg Intravenous Once PRN Gunnar Bulla, MD      .  pantoprazole (PROTONIX) EC tablet 40 mg  40 mg Oral QAC breakfast Gonzella Lex, MD   40 mg at 06/28/16 0853  . QUEtiapine (SEROQUEL) tablet 100 mg  100 mg Oral QHS Himabindu Ravi, MD   100 mg at 06/27/16 2156    Lab Results:  Results for orders placed or performed during the hospital encounter of 06/20/16 (from the past 48 hour(s))  Glucose, capillary     Status: None   Collection Time: 06/27/16  6:45 AM  Result Value Ref Range   Glucose-Capillary 96 65 - 99 mg/dL    Blood Alcohol level:  Lab Results  Component Value Date   ETH <5 43/32/9518    Metabolic Disorder Labs: Lab Results  Component Value Date   HGBA1C 5.3 06/21/2016   MPG 105 06/21/2016   No results found for: PROLACTIN Lab Results  Component Value Date   CHOL 145 06/21/2016   TRIG 51 06/21/2016   HDL 44 06/21/2016   CHOLHDL 3.3 06/21/2016   VLDL 10 06/21/2016   LDLCALC 91 06/21/2016   LDLCALC 70 08/22/2013    Physical Findings: AIMS:  , ,  ,  ,    CIWA:    COWS:     Musculoskeletal: Strength & Muscle Tone: weak, some generalized atrophy due to patient limiting her mobility Gait & Station: normal Patient leans: N/A  Psychiatric Specialty Exam: Physical Exam  Nursing note and vitals reviewed. Constitutional: She appears well-developed and well-nourished.  HENT:  Head: Normocephalic and atraumatic.  Eyes: Conjunctivae are normal. Pupils are equal, round, and reactive to light.  Neck: Normal range of motion.  Cardiovascular: Regular rhythm and normal heart sounds.   Respiratory: Effort normal. No respiratory distress.  GI: Soft.  Musculoskeletal: Normal range of motion.  Neurological: She is alert.  Skin: Skin is warm and dry.  Psychiatric: Her affect is blunt. Her speech is delayed. She is slowed. She expresses impulsivity. She expresses no suicidal ideation. She exhibits abnormal recent memory.    Review of Systems  Constitutional: Negative.   HENT: Negative.   Eyes: Negative.    Respiratory: Negative.   Cardiovascular: Negative.   Gastrointestinal: Negative.   Musculoskeletal: Negative.   Skin: Negative.   Neurological: Negative.   Psychiatric/Behavioral: Positive for  depression. Negative for hallucinations, memory loss, substance abuse and suicidal ideas. The patient is nervous/anxious. The patient does not have insomnia.     Blood pressure (!) 127/54, pulse 84, temperature 98.5 F (36.9 C), temperature source Oral, resp. rate 18, height 5' 2"  (1.575 m), weight 78.5 kg (173 lb), SpO2 92 %.Body mass index is 31.64 kg/m.  General Appearance: Casual  Eye Contact:  Fair  Speech:  Slow, soft, minimal  Volume:  Decreased  Mood:  Euthymic  Affect:  Congruent  Thought Process:  Coherent  Orientation:  Full (Time, Place, and Person)  Thought Content:  Rumination  Suicidal Thoughts:  Yes.  without intent/plan  Homicidal Thoughts:  No  Memory:  Immediate;   Fair Recent;   Fair Remote;   Fair  Judgement:  Impaired  Insight:  Lacking  Psychomotor Activity:  Psychomotor Retardation  Concentration:  Concentration: Fair and Attention Span: Fair  Recall:  [employer] of Knowledge:  Fair  Language:  Fair  Akathisia:  No  Handed:  Right  AIMS (if indicated):     Assets:  Communication Skills Housing  ADL's:  Impaired  Cognition:  WNL  Sleep:  Number of Hours: 7.45     Treatment Plan Summary: Daily contact with patient to assess and evaluate symptoms and progress in treatment and Medication management   Major Depressive Disorder Continue increased dose of Lexapro and current seroquel. Consult to ECT since patient not responding to multiple medications and decompensating.  Anxiety- Continue Buspar  Malnutrition- Nutrition consult done, recommendations being followed with ensure supplements.  Hypothyroidism- synthroid  Continue Aricept per primary care.  GERD- Pantoprazole  Labs-TSH wnl, lipid panel wnl, obtain UA  Disposition- After ECT treatment  to outpatient Psychiatrist. Patient may need assisted living or nursing home based on response to ECT.  Continue ECT 3 times a week into next week. So far appears to be tolerating it well and we are hoping to start seeing some clear improvement. No change to medication. Explained to patient the rationale for continued hospitalization and encouraged her to be as active as possible and to eat well.   3/10 patient reports doing well today. She did not have major issues or complaints. No medication changes will be made. Vital signs in nursing notes were reviewed.  Stable and improving  Hildred Priest, MD 06/28/2016, 9:47 AM

## 2016-06-28 NOTE — [facility] Group Notes (Signed)
[place] Group Notes:  (Nursing/MHT/Case Management/Adjunct)  Date:  06/28/2016  Time:  10:47 PM  Type of Therapy:  Psychoeducational Skills  Participation Level:  Did Not Attend  Summary of Progress/Problems:  Cassidy [name] 06/28/2016, 10:47 PM

## 2016-06-28 NOTE — [facility] Group Notes (Signed)
[place] Group Notes:  (Nursing/MHT/Case Management/Adjunct)  Date:  06/28/2016  Time:  2:23 AM  Type of Therapy:  Psychoeducational Skills  Participation Level:  Did Not Attend  Summary of Progress/Problems:  Reece Agar 06/28/2016, 2:23 AM

## 2016-06-28 NOTE — Progress Notes (Signed)
D: Patient stated slept good last night .Stated appetite fair and energy level  Is normal. Stated concentration is good .Denies  Depression , hopeless and anxiety . Denies suicidal  homicidal ideations  .  No auditory hallucinations  No pain concerns . Appropriate ADL'S. Interacting with peers and staff. Patient voice of wanting to go home . Stated she had talked with DR. Clapacs  Early in the week and projected date Monday after ECT . Limited interaction  With her peers . Patient stayed closed  To her room this shift     A.Encourage patient participation with unit programming . Instruction  Given on  Medication , verbalize understanding. R: Voice no other concerns. Staff continue to monitor

## 2016-06-29 MED ORDER — TUBERCULIN PPD 5 UNIT/0.1ML ID SOLN
5.0000 [IU] | Freq: Once | INTRADERMAL | Status: AC
  Administered 2016-06-29: 5 [IU] via INTRADERMAL
  Filled 2016-06-29: qty 0.1

## 2016-06-29 NOTE — Progress Notes (Signed)
Patient sister visiting, states she will be the one to pick up patient for discharge. [place]: [phone] Cell: [phone]

## 2016-06-29 NOTE — Progress Notes (Signed)
Pt denies SI/HI/AVH. Denies pain. Stated she is hopeful to go home on Monday. Pt mostly isolated to room this shift. States it is too loud in dayroom. Medication compliant. Voices no other concerns at this time. Encouragement and support provided. Safety maintained. Will continue to monitor.

## 2016-06-29 NOTE — Progress Notes (Signed)
[facility] MD Progress Note  06/29/2016 11:23 AM Cassidy [name]  MRN:  [id number] Subjective:Patient is a 76 yo woman with severe depression , decompensating over past few months with poor nutrition and not responsive to multi ple medication changes, seen by outpatient psychiatrist at [facility]. Admitted for stabilization .   Patient reports being unhappy being in the hospital. Per her sister she has not showered in months.She was initially resistant but showered with help of tech. She has not been eating, consumption less than 10 % of her meals. She is compliant with medications. Minimal participation in groups. Continues to say that her grandson is the only one keeping her from dying.  Follow-up for Friday the ninth. Patient had third ECT treatment today which once again went well and appeared to be well tolerated. Patient continues to irritably focused on discharge plans. Social work spoke with her grandson today who is strongly supportive of her continued hospitalization. Family will be coming in over the weekend. Patient still spends too much time in bed for my liking although she says that she is feeling better.  3/10 she says she is unhappy because she is still in the hospital. She says she wants to go home. She grades her mood as an 8 out of 10 x 10 being the best. Says that she has been eating and has been attending groups. She denies suicidality or homicidality. She also denies having any hallucinations. She denies having any side effects from medications. She denies any physical complaints. He states that ECT has been helping her. She was encouraged to discuss discharge with her primary psychiatrist on Monday  Per staff she is isolating herself in the room  3/11 patient was more engaged in the assessment today. She says she is doing very well denies having any issues or concerns. Denies side effects to medications or having any physical complaints. Says that her mood is improved, denies suicidality,  homicidality or auditory or visual hallucinations. Says that she has been in many sleeping very well.  Per nursing: Pt denies SI/HI/AVH. Denies pain. Stated she is hopeful to go home on Monday. Pt mostly isolated to room this shift. States it is too loud in dayroom. Medication compliant. Voices no other concerns at this time. Encouragement and support provided. Safety maintained. Will continue to monitor.  D: Patient stated slept good last night .Stated appetite fair and energy level  Is normal. Stated concentration is good .Denies  Depression , hopeless and anxiety . Denies suicidal  homicidal ideations  .  No auditory hallucinations  No pain concerns . Appropriate ADL'S. Interacting with peers and staff. Patient voice of wanting to go home . Stated she had talked with DR. Clapacs  Early in the week and projected date Monday after ECT . Limited interaction  With her peers . Patient stayed closed  To her room this shift     A.Encourage patient participation with unit programming . Instruction  Given on  Medication , verbalize understanding. R: Voice no other concerns. Staff continue to monitor   Principal Problem: Severe recurrent major depression without psychotic features [facility]) Diagnosis:   Patient Active Problem List   Diagnosis Date Noted  . Severe recurrent major depression without psychotic features ([place]) [F33.2] 06/19/2016  . Hepatic encephalopathy ([place]) [K72.90] 08/13/2015  . Thrombocytopenia ([place]) [D69.6] 07/23/2015  . Amnesia [R41.3] 03/20/2015  . Major depression in remission ([place]) [F32.5] 03/12/2015  . Gonalgia [M25.569] 01/05/2015  . Absolute anemia [D64.9] 11/27/2014  . Arthritis [M19.90] 11/27/2014  .  Acid reflux [K21.9] 11/27/2014  . Depression, major, recurrent, in partial remission ([place]) [F33.41] 11/27/2014  . Encounter for general adult medical examination without abnormal findings [Z00.00] 08/25/2014  . Chronic LBP [M54.5, G89.29] 08/15/2014  . 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 Infraspinatus tenosynovitis [M65.819] 05/01/2014  . Other synovitis and tenosynovitis, right shoulder [M65.811] 05/01/2014  . Difficulty in walking [R26.2] 11/30/2013  . Extreme obesity ([place]) [E66.01] 11/30/2013  . Morbid obesity ([place]) [E66.01] 11/30/2013  . Arthritis, degenerative [M19.90] 10/01/2013  . Steatohepatitis [K75.81] 10/01/2013  . Orthostasis [I95.1] 10/01/2013  . Appendicular ataxia [R27.0] 09/26/2013  . Fall [W19.XXXA] 09/26/2013  . Dizziness [R42] 09/07/2013  . Osteoporosis with fracture [M80.00XA] 09/06/2013  . Clinical depression [F32.9] 03/25/2012  . Adult hypothyroidism [E03.9] 03/25/2012  . Esophageal stenosis [K22.2] 07/03/2009  . Back ache [M54.9] 03/08/2003  . Anxiety state [F41.1] 12/27/2002   Total Time spent with patient: 20 minutes  Past Psychiatric History: Patient has had prior psychiatric admissions here at our hospital before. Denies suicide attempts. Patient has been on multiple antidepressants without consistent benefit. She is currently on Lexapro and BuSpar and Seroquel  Past Medical History:  Past Medical History:  Diagnosis Date  . Anxiety   . Depression   . Fatty liver   . GERD (gastroesophageal reflux disease)   . Hypothyroidism   . Obesity   . Thyroid disease     Past Surgical History:  Procedure Laterality Date  . APPENDECTOMY    . ESOPHAGOGASTRODUODENOSCOPY (EGD) WITH PROPOFOL N/A 12/20/2014   Procedure: ESOPHAGOGASTRODUODENOSCOPY (EGD) WITH PROPOFOL;  Surgeon: Manya Silvas, MD;  Location[facility] ENDOSCOPY;  Service: Endoscopy;  Laterality: N/A;  . SAVORY DILATION N/A 12/20/2014   Procedure: SAVORY DILATION;  Surgeon: Manya Silvas, MD;  Location: [facility] ENDOSCOPY;  Service: Endoscopy;  Laterality: N/A;  . TONSILLECTOMY     Family History:  Family History  Problem Relation Age of Onset  . Diabetes Mother   . COPD Mother   . Kidney disease Mother   . Depression Mother   . Heart  attack Father   . Aneurysm Father   . Anxiety disorder Sister   . Depression Sister   . Diabetes Sister   . Hypertension Sister   . Atrial fibrillation Brother   . Spinal muscular atrophy Brother   . Breast cancer Sister   . Bone cancer Sister   . Depression Sister   . Anxiety disorder Sister   . Breast cancer Sister   . Colon cancer Sister   . Depression Sister   . Diabetes Sister   . COPD Sister   . Depression Sister   . Anxiety disorder Sister   . Diabetes Brother   . Depression Brother    Family Psychiatric  History: see above Social History:  History  Alcohol Use No     History  Drug Use No    Social History   Social History  . Marital status: Widowed    Spouse name: N/A  . Number of children: N/A  . Years of education: N/A   Social History Main Topics  . Smoking status: Never Smoker  . Smokeless tobacco: Never Used  . Alcohol use No  . Drug use: No  . Sexual activity: Not Currently    Birth control/ protection: Post-menopausal   Other Topics Concern  . None   Social History Narrative  . None   Additional Social History:  Sleep: Good  Appetite:  Poor  Current Medications: Current Facility-Administered Medications  Medication Dose Route Frequency Provider Last Rate Last Dose  . acetaminophen (TYLENOL) tablet 650 mg  650 mg Oral Q6H PRN Gonzella Lex, MD      . alum & mag hydroxide-simeth (MAALOX/MYLANTA) 200-200-20 MG/5ML suspension 30 mL  30 mL Oral Q4H PRN Gonzella Lex, MD      . busPIRone (BUSPAR) tablet 15 mg  15 mg Oral BID Gonzella Lex, MD   15 mg at 06/29/16 0817  . celecoxib (CELEBREX) capsule 100 mg  100 mg Oral Daily Gonzella Lex, MD   100 mg at 06/29/16 0817  . donepezil (ARICEPT) tablet 5 mg  5 mg Oral QHS Gonzella Lex, MD   5 mg at 06/28/16 2122  . escitalopram (LEXAPRO) tablet 10 mg  10 mg Oral QHS Gonzella Lex, MD   10 mg at 06/28/16 2122  . feeding supplement (ENSURE ENLIVE) (ENSURE ENLIVE)  liquid 237 mL  237 mL Oral BID BM Himabindu Ravi, MD   237 mL at 06/27/16 1400  . fentaNYL (SUBLIMAZE) injection 25 mcg  25 mcg Intravenous Q5 min PRN Molli Barrows, MD      . fluticasone [facility]) 50 MCG/ACT nasal spray 2 spray  2 spray Each Nare Daily Gonzella Lex, MD   2 spray at 06/29/16 0816  . levothyroxine (SYNTHROID, LEVOTHROID) tablet 75 mcg  75 mcg Oral QAC breakfast Gonzella Lex, MD   75 mcg at 06/29/16 0816  . magnesium hydroxide (MILK OF MAGNESIA) suspension 30 mL  30 mL Oral Daily PRN Gonzella Lex, MD      . ondansetron (ZOFRAN) injection 4 mg  4 mg Intravenous Once PRN Molli Barrows, MD      . ondansetron [facility]) injection 4 mg  4 mg Intravenous Once PRN Gunnar Bulla, MD      . pantoprazole (PROTONIX) EC tablet 40 mg  40 mg Oral QAC breakfast Gonzella Lex, MD   40 mg at 06/29/16 0817  . QUEtiapine (SEROQUEL) tablet 100 mg  100 mg Oral QHS Himabindu Ravi, MD   100 mg at 06/28/16 2122    Lab Results:  No results found for this or any previous visit (from the past 48 hour(s)).  Blood Alcohol level:  Lab Results  Component Value Date   ETH <5 70/17/7939    Metabolic Disorder Labs: Lab Results  Component Value Date   HGBA1C 5.3 06/21/2016   MPG 105 06/21/2016   No results found for: PROLACTIN Lab Results  Component Value Date   CHOL 145 06/21/2016   TRIG 51 06/21/2016   HDL 44 06/21/2016   CHOLHDL 3.3 06/21/2016   VLDL 10 06/21/2016   LDLCALC 91 06/21/2016   LDLCALC 70 08/22/2013    Physical Findings: AIMS:  , ,  ,  ,    CIWA:    COWS:     Musculoskeletal: Strength & Muscle Tone: weak, some generalized atrophy due to patient limiting her mobility Gait & Station: normal Patient leans: N/A  Psychiatric Specialty Exam: Physical Exam  Nursing note and vitals reviewed. Constitutional: She appears well-developed and well-nourished.  HENT:  Head: Normocephalic and atraumatic.  Eyes: Conjunctivae are normal. Pupils are equal, round, and reactive to  light.  Neck: Normal range of motion.  Cardiovascular: Regular rhythm and normal heart sounds.   Respiratory: Effort normal. No respiratory distress.  GI: Soft.  Musculoskeletal: Normal range of motion.  Neurological:  She is alert.  Skin: Skin is warm and dry.  Psychiatric: Her affect is blunt. Her speech is delayed. She is slowed. She expresses impulsivity. She expresses no suicidal ideation. She exhibits abnormal recent memory.    Review of Systems  Constitutional: Negative.   HENT: Negative.   Eyes: Negative.   Respiratory: Negative.   Cardiovascular: Negative.   Gastrointestinal: Negative.   Musculoskeletal: Negative.   Skin: Negative.   Neurological: Negative.   Psychiatric/Behavioral: Positive for depression. Negative for hallucinations, memory loss, substance abuse and suicidal ideas. The patient is nervous/anxious. The patient does not have insomnia.     Blood pressure 135/67, pulse 85, temperature 98.6 F (37 C), temperature source Oral, resp. rate 18, height 5' 2"  (1.575 m), weight 78.5 kg (173 lb), SpO2 92 %.Body mass index is 31.64 kg/m.  General Appearance: Casual  Eye Contact:  Fair  Speech:  Slow, soft, minimal  Volume:  Decreased  Mood:  Euthymic  Affect:  Congruent  Thought Process:  Coherent  Orientation:  Full (Time, Place, and Person)  Thought Content:  Rumination  Suicidal Thoughts:  Yes.  without intent/plan  Homicidal Thoughts:  No  Memory:  Immediate;   Fair Recent;   Fair Remote;   Fair  Judgement:  Impaired  Insight:  Lacking  Psychomotor Activity:  Psychomotor Retardation  Concentration:  Concentration: Fair and Attention Span: Fair  Recall:  [employer] of Knowledge:  Fair  Language:  Fair  Akathisia:  No  Handed:  Right  AIMS (if indicated):     Assets:  Communication Skills Housing  ADL's:  Impaired  Cognition:  WNL  Sleep:  Number of Hours: 6.15     Treatment Plan Summary: Daily contact with patient to assess and evaluate  symptoms and progress in treatment and Medication management   Major Depressive Disorder Continue increased dose of Lexapro and current seroquel. Consult to ECT since patient not responding to multiple medications and decompensating.  Anxiety- Continue Buspar  Malnutrition- Nutrition consult done, recommendations being followed with ensure supplements.  Hypothyroidism- synthroid  Continue Aricept per primary care.  GERD- Pantoprazole  Labs-TSH wnl, lipid panel wnl, obtain UA  Disposition- After ECT treatment to outpatient Psychiatrist. Patient may need assisted living or nursing home based on response to ECT.  Continue ECT 3 times a week into next week. So far appears to be tolerating it well and we are hoping to start seeing some clear improvement. No change to medication. Explained to patient the rationale for continued hospitalization and encouraged her to be as active as possible and to eat well.   3/10 patient reports doing well today. She did not have major issues or complaints. No medication changes will be made. Vital signs in nursing notes were reviewed.  Stable and improving  3/11 patient is doing well today. No changes will be made in her medication regimen. She seems to be improving. Patient feels better and says she is looking forward to be discharged soon next week. I encouraged her to discuss this with Dr. Lissa Hoard packs tomorrow. Patient has been compliant with her medications and has maintained a fair to good oral intake  Hildred Priest, MD 06/29/2016, 11:23 AM

## 2016-06-29 NOTE — Progress Notes (Signed)
Received in pleasant mood today. Is ambulatory, compliant with medications and meals though eats little. She remains in bed majority of the day and only comes out when she needs to. Encouraged to practice good hygiene and be interactive on unit but prefers to remain in room. Voiced excitement regarding discharge. Denies SI/HI/AVH. No complaints voiced. Will continue to monitor and encourage.

## 2016-06-29 NOTE — Progress Notes (Signed)
PPD placed to left forearm to be read in 48-72 hours. See EMAR.

## 2016-06-29 NOTE — [facility] Group Notes (Signed)
[place] LCSW Group Therapy  06/29/2016 2:18 PM  Type of Therapy:  Group Therapy  Participation Level:  Minimal  Participation Quality:  Attentive  Affect:  Appropriate  Cognitive:  Alert  Insight:  Limited  Engagement in Therapy:  Limited  Modes of Intervention:  Discussion, Education, Problem-solving, Reality Testing and Support  Summary of Progress/Problems: Communications: Patients identify how individuals communicate with one another appropriately and inappropriately. Patients will be guided to discuss their thoughts, feelings, and behaviors related to barriers when communicating. The group will process together ways to execute positive and appropriate communications.   Cassidy [name] G. Bureau, [place] 06/29/2016, 2:21 PM

## 2016-06-30 ENCOUNTER — Other Ambulatory Visit: Payer: Self-pay | Admitting: Psychiatry

## 2016-06-30 LAB — GLUCOSE, CAPILLARY: GLUCOSE-CAPILLARY: 97 mg/dL (ref 65–99)

## 2016-06-30 MED ORDER — LABETALOL HCL 5 MG/ML IV SOLN
INTRAVENOUS | Status: AC
  Filled 2016-06-30: qty 4

## 2016-06-30 MED ORDER — SUCCINYLCHOLINE CHLORIDE 20 MG/ML IJ SOLN
INTRAMUSCULAR | Status: AC
  Filled 2016-06-30: qty 1

## 2016-06-30 NOTE — Progress Notes (Signed)
Received in bed today, refused ECT stating "I don't want to go, I feel like I've been forced to go and I never wanted to." Endorses feeling better than when she came in but still didn't want to go. Her sister called and, after speaking to her, patient decided she would accept ECT but it was too late in the day at that point. Complaint with medications and groups. Remains in room otherwise. Endorses desire to go home today as previously informed by team. Denies SI/HI/AVH. Will continue to monitor.

## 2016-06-30 NOTE — Progress Notes (Signed)
Recreation Therapy Notes  Date: 03.12.18 Time: 9:30 am Location: Craft Room   Group Topic: Self-expression   Goal Area(s) Addresses:  Patient will identify one color per emotion listed on wheel. Patient will verbalize benefit of using art as a means of self-expression Patient will verbalize one emotions experienced during session. Patient will be educated on other forms of self-expression.   Behavioral Response: Attentive   Intervention: Emotion Wheel   Activity: Patients were given an emotion wheel worksheet and were instructed to pick a color for each emotion listed on the wheel.   Education: LRT educated patients other forms of self-expression.   Education Outcome: Acknowledges education/In group clarification offered/Needs more education   Clinical Observations/Feedback: Patient picked a color for each emotion. Patient did not contribute to group discussion.  Leonette Monarch, LRT/CTRS 06/30/2016 10:25 AM

## 2016-06-30 NOTE — Plan of Care (Signed)
Problem: Activity: Goal: Sleeping patterns will improve Outcome: Progressing Pt sleeping 6 or more hours per night

## 2016-06-30 NOTE — Progress Notes (Signed)
[facility] MD Progress Note  06/30/2016 8:44 PM Cassidy [name]  MRN:  [id number] Subjective:Patient is a 76 yo woman with severe depression , decompensating over past few months with poor nutrition and not responsive to multi ple medication changes, seen by outpatient psychiatrist at [facility]. Admitted for stabilization .   Patient reports being unhappy being in the hospital. Per her sister she has not showered in months.She was initially resistant but showered with help of tech. She has not been eating, consumption less than 10 % of her meals. She is compliant with medications. Minimal participation in groups. Continues to say that her grandson is the only one keeping her from dying.  Follow-up for Friday the ninth. Patient had third ECT treatment today which once again went well and appeared to be well tolerated. Patient continues to irritably focused on discharge plans. Social work spoke with her grandson today who is strongly supportive of her continued hospitalization. Family will be coming in over the weekend. Patient still spends too much time in bed for my liking although she says that she is feeling better.  3/10 she says she is unhappy because she is still in the hospital. She says she wants to go home. She grades her mood as an 8 out of 10 x 10 being the best. Says that she has been eating and has been attending groups. She denies suicidality or homicidality. She also denies having any hallucinations. She denies having any side effects from medications. She denies any physical complaints. He states that ECT has been helping her. She was encouraged to discuss discharge with her primary psychiatrist on Monday  Per staff she is isolating herself in the room  3/11 patient was more engaged in the assessment today. She says she is doing very well denies having any issues or concerns. Denies side effects to medications or having any physical complaints. Says that her mood is improved, denies suicidality,  homicidality or auditory or visual hallucinations. Says that she has been in many sleeping very well.  Follow-up for Monday the 12th. Patient refused to come to ECT this morning. I am told that she told that her family that she would not be coming because she didn't feel like she needed the treatment anymore. I met with her this evening and the patient claimed that she had not refused at all and that it was all a misunderstanding. I spoke with the patient and also with her sister. It sounds like there are some signs that she has gotten better but she still has poor insight and is variable in her behavior and her response. Family has very legitimate concerns that she will return to her old behaviors as soon as she comes home.  Per nursing: Pt denies SI/HI/AVH. Denies pain. Stated she is hopeful to go home on Monday. Pt mostly isolated to room this shift. States it is too loud in dayroom. Medication compliant. Voices no other concerns at this time. Encouragement and support provided. Safety maintained. Will continue to monitor.  D: Patient stated slept good last night .Stated appetite fair and energy level  Is normal. Stated concentration is good .Denies  Depression , hopeless and anxiety . Denies suicidal  homicidal ideations  .  No auditory hallucinations  No pain concerns . Appropriate ADL'S. Interacting with peers and staff. Patient voice of wanting to go home . Stated she had talked with DR. Clapacs  Early in the week and projected date Monday after ECT . Limited interaction  With her  peers . Patient stayed closed  To her room this shift     A.Encourage patient participation with unit programming . Instruction  Given on  Medication , verbalize understanding. R: Voice no other concerns. Staff continue to monitor   Principal Problem: Severe recurrent major depression without psychotic features [facility]) Diagnosis:   Patient Active Problem List   Diagnosis Date Noted  . Severe recurrent major depression  without psychotic features ([place]) [F33.2] 06/19/2016  . Hepatic encephalopathy ([place]) [K72.90] 08/13/2015  . Thrombocytopenia ([place]) [D69.6] 07/23/2015  . Amnesia [R41.3] 03/20/2015  . Major depression in remission ([place]) [F32.5] 03/12/2015  . Gonalgia [M25.569] 01/05/2015  . Absolute anemia [D64.9] 11/27/2014  . Arthritis [M19.90] 11/27/2014  . Acid reflux [K21.9] 11/27/2014  . Depression, major, recurrent, in partial remission ([place]) [F33.41] 11/27/2014  . Encounter for general adult medical examination without abnormal findings [Z00.00] 08/25/2014  . Chronic LBP [M54.5, G89.29] 08/15/2014  . 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 Infraspinatus tenosynovitis [M65.819] 05/01/2014  . Other synovitis and tenosynovitis, right shoulder [M65.811] 05/01/2014  . Difficulty in walking [R26.2] 11/30/2013  . Extreme obesity ([place]) [E66.01] 11/30/2013  . Morbid obesity ([place]) [E66.01] 11/30/2013  . Arthritis, degenerative [M19.90] 10/01/2013  . Steatohepatitis [K75.81] 10/01/2013  . Orthostasis [I95.1] 10/01/2013  . Appendicular ataxia [R27.0] 09/26/2013  . Fall [W19.XXXA] 09/26/2013  . Dizziness [R42] 09/07/2013  . Osteoporosis with fracture [M80.00XA] 09/06/2013  . Clinical depression [F32.9] 03/25/2012  . Adult hypothyroidism [E03.9] 03/25/2012  . Esophageal stenosis [K22.2] 07/03/2009  . Back ache [M54.9] 03/08/2003  . Anxiety state [F41.1] 12/27/2002   Total Time spent with patient: 20 minutes  Past Psychiatric History: Patient has had prior psychiatric admissions here at our hospital before. Denies suicide attempts. Patient has been on multiple antidepressants without consistent benefit. She is currently on Lexapro and BuSpar and Seroquel  Past Medical History:  Past Medical History:  Diagnosis Date  . Anxiety   . Depression   . Fatty liver   . GERD (gastroesophageal reflux disease)   . Hypothyroidism   . Obesity   . Thyroid disease     Past Surgical  History:  Procedure Laterality Date  . APPENDECTOMY    . ESOPHAGOGASTRODUODENOSCOPY (EGD) WITH PROPOFOL N/A 12/20/2014   Procedure: ESOPHAGOGASTRODUODENOSCOPY (EGD) WITH PROPOFOL;  Surgeon: Manya Silvas, MD;  Location: [facility] ENDOSCOPY;  Service: Endoscopy;  Laterality: N/A;  . SAVORY DILATION N/A 12/20/2014   Procedure: SAVORY DILATION;  Surgeon: Manya Silvas, MD;  Location: [facility] ENDOSCOPY;  Service: Endoscopy;  Laterality: N/A;  . TONSILLECTOMY     Family History:  Family History  Problem Relation Age of Onset  . Diabetes Mother   . COPD Mother   . Kidney disease Mother   . Depression Mother   . Heart attack Father   . Aneurysm Father   . Anxiety disorder Sister   . Depression Sister   . Diabetes Sister   . Hypertension Sister   . Atrial fibrillation Brother   . Spinal muscular atrophy Brother   . Breast cancer Sister   . Bone cancer Sister   . Depression Sister   . Anxiety disorder Sister   . Breast cancer Sister   . Colon cancer Sister   . Depression Sister   . Diabetes Sister   . COPD Sister   . Depression Sister   . Anxiety disorder Sister   . Diabetes Brother   . Depression Brother    Family Psychiatric  History: see above  Social History:  History  Alcohol Use No     History  Drug Use No    Social History   Social History  . Marital status: Widowed    Spouse name: N/A  . Number of children: N/A  . Years of education: N/A   Social History Main Topics  . Smoking status: Never Smoker  . Smokeless tobacco: Never Used  . Alcohol use No  . Drug use: No  . Sexual activity: Not Currently    Birth control/ protection: Post-menopausal   Other Topics Concern  . None   Social History Narrative  . None   Additional Social History:                      Sleep: Good  Appetite:  Poor  Current Medications: Current Facility-Administered Medications  Medication Dose Route Frequency Provider Last Rate Last Dose  . acetaminophen (TYLENOL)  tablet 650 mg  650 mg Oral Q6H PRN Gonzella Lex, MD      . alum & mag hydroxide-simeth (MAALOX/MYLANTA) 200-200-20 MG/5ML suspension 30 mL  30 mL Oral Q4H PRN Gonzella Lex, MD      . busPIRone (BUSPAR) tablet 15 mg  15 mg Oral BID Gonzella Lex, MD   15 mg at 06/30/16 1731  . celecoxib (CELEBREX) capsule 100 mg  100 mg Oral Daily Gonzella Lex, MD   100 mg at 06/30/16 1255  . donepezil (ARICEPT) tablet 5 mg  5 mg Oral QHS Gonzella Lex, MD   5 mg at 06/29/16 2103  . escitalopram (LEXAPRO) tablet 10 mg  10 mg Oral QHS Gonzella Lex, MD   10 mg at 06/29/16 2103  . feeding supplement (ENSURE ENLIVE) (ENSURE ENLIVE) liquid 237 mL  237 mL Oral BID BM Himabindu Ravi, MD   237 mL at 06/30/16 1400  . fentaNYL (SUBLIMAZE) injection 25 mcg  25 mcg Intravenous Q5 min PRN Molli Barrows, MD      . fluticasone [facility]) 50 MCG/ACT nasal spray 2 spray  2 spray Each Nare Daily Gonzella Lex, MD   2 spray at 06/30/16 1254  . levothyroxine (SYNTHROID, LEVOTHROID) tablet 75 mcg  75 mcg Oral QAC breakfast Gonzella Lex, MD   75 mcg at 06/30/16 [phone]  . magnesium hydroxide (MILK OF MAGNESIA) suspension 30 mL  30 mL Oral Daily PRN Gonzella Lex, MD      . ondansetron (ZOFRAN) injection 4 mg  4 mg Intravenous Once PRN Molli Barrows, MD      . ondansetron [facility]) injection 4 mg  4 mg Intravenous Once PRN Gunnar Bulla, MD      . pantoprazole (PROTONIX) EC tablet 40 mg  40 mg Oral QAC breakfast Gonzella Lex, MD   40 mg at 06/30/16 0743  . QUEtiapine (SEROQUEL) tablet 100 mg  100 mg Oral QHS Himabindu Ravi, MD   100 mg at 06/29/16 2103  . tuberculin injection 5 Units  5 Units Intradermal Once Hildred Priest, MD   5 Units at 06/29/16 1406    Lab Results:  Results for orders placed or performed during the hospital encounter of 06/20/16 (from the past 48 hour(s))  Glucose, capillary     Status: None   Collection Time: 06/30/16  6:40 AM  Result Value Ref Range   Glucose-Capillary 97 65 - 99 mg/dL     Blood Alcohol level:  Lab Results  Component Value Date   ETH <5 06/19/2016  Metabolic Disorder Labs: Lab Results  Component Value Date   HGBA1C 5.3 06/21/2016   MPG 105 06/21/2016   No results found for: PROLACTIN Lab Results  Component Value Date   CHOL 145 06/21/2016   TRIG 51 06/21/2016   HDL 44 06/21/2016   CHOLHDL 3.3 06/21/2016   VLDL 10 06/21/2016   LDLCALC 91 06/21/2016   LDLCALC 70 08/22/2013    Physical Findings: AIMS:  , ,  ,  ,    CIWA:    COWS:     Musculoskeletal: Strength & Muscle Tone: weak, some generalized atrophy due to patient limiting her mobility Gait & Station: normal Patient leans: N/A  Psychiatric Specialty Exam: Physical Exam  Nursing note and vitals reviewed. Constitutional: She appears well-developed and well-nourished.  HENT:  Head: Normocephalic and atraumatic.  Eyes: Conjunctivae are normal. Pupils are equal, round, and reactive to light.  Neck: Normal range of motion.  Cardiovascular: Regular rhythm and normal heart sounds.   Respiratory: Effort normal. No respiratory distress.  GI: Soft.  Musculoskeletal: Normal range of motion.  Neurological: She is alert.  Skin: Skin is warm and dry.  Psychiatric: Her affect is blunt. Her speech is delayed. She is slowed. She expresses impulsivity. She expresses no suicidal ideation. She exhibits abnormal recent memory.    Review of Systems  Constitutional: Negative.   HENT: Negative.   Eyes: Negative.   Respiratory: Negative.   Cardiovascular: Negative.   Gastrointestinal: Negative.   Musculoskeletal: Negative.   Skin: Negative.   Neurological: Negative.   Psychiatric/Behavioral: Positive for depression. Negative for hallucinations, memory loss, substance abuse and suicidal ideas. The patient is nervous/anxious. The patient does not have insomnia.     Blood pressure (!) 141/84, pulse 87, temperature 97.9 F (36.6 C), temperature source Oral, resp. rate 18, height _0  (1.575  m), weight 78.5 kg (173 lb), SpO2 92 %.Body mass index is 31.64 kg/m.  General Appearance: Casual  Eye Contact:  Fair  Speech:  Slow, soft, minimal  Volume:  Decreased  Mood:  Euthymic  Affect:  Congruent  Thought Process:  Coherent  Orientation:  Full (Time, Place, and Person)  Thought Content:  Rumination  Suicidal Thoughts:  Yes.  without intent/plan  Homicidal Thoughts:  No  Memory:  Immediate;   Fair Recent;   Fair Remote;   Fair  Judgement:  Impaired  Insight:  Lacking  Psychomotor Activity:  Psychomotor Retardation  Concentration:  Concentration: Fair and Attention Span: Fair  Recall:  [employer] of Knowledge:  Fair  Language:  Fair  Akathisia:  No  Handed:  Right  AIMS (if indicated):     Assets:  Communication Skills Housing  ADL's:  Impaired  Cognition:  WNL  Sleep:  Number of Hours: 7.15     Treatment Plan Summary: Daily contact with patient to assess and evaluate symptoms and progress in treatment and Medication management   Major Depressive Disorder Continue increased dose of Lexapro and current seroquel. Consult to ECT since patient not responding to multiple medications and decompensating.  Anxiety- Continue Buspar  Malnutrition- Nutrition consult done, recommendations being followed with ensure supplements.  Hypothyroidism- synthroid  Continue Aricept per primary care.  GERD- Pantoprazole  Labs-TSH wnl, lipid panel wnl, obtain UA  Disposition- After ECT treatment to outpatient Psychiatrist. Patient may need assisted living or nursing home based on response to ECT.  Continue ECT 3 times a week into next week. So far appears to be tolerating it well and we are hoping  to start seeing some clear improvement. No change to medication. Explained to patient the rationale for continued hospitalization and encouraged her to be as active as possible and to eat well.   3/10 patient reports doing well today. She did not have major issues or complaints. No  medication changes will be made. Vital signs in nursing notes were reviewed.  Stable and improving  3/11 patient is doing well today. No changes will be made in her medication regimen. She seems to be improving. Patient feels better and says she is looking forward to be discharged soon next week. I encouraged her to discuss this with Dr. Lissa Hoard packs tomorrow. Patient has been compliant with her medications and has maintained a fair to good oral intake  I never told the patient that it was definite that she would be discharged today. She is disappointed. I agree with the family that discharging her now would reinforce her avoidance of treatment. We are probably going to be looking at having her stay in the hospital at least through Wednesday. No change to medication for today. Reviewed plan with patient.  Alethia Berthold, MD 06/30/2016, 8:44 PM

## 2016-06-30 NOTE — BHH Group Notes (Signed)
[place] LCSW Group Therapy Note  Date/Time: 06/30/16, 1300  Type of Therapy and Topic:  Group Therapy:  Overcoming Obstacles  Participation Level:  minimal  Description of Group:    In this group patients will be encouraged to explore what they see as obstacles to their own wellness and recovery. They will be guided to discuss their thoughts, feelings, and behaviors related to these obstacles. The group will process together ways to cope with barriers, with attention given to specific choices patients can make. Each patient will be challenged to identify changes they are motivated to make in order to overcome their obstacles. This group will be process-oriented, with patients participating in exploration of their own experiences as well as giving and receiving support and challenge from other group members.  Therapeutic Goals: 1. Patient will identify personal and current obstacles as they relate to admission. 2. Patient will identify barriers that currently interfere with their wellness or overcoming obstacles.  3. Patient will identify feelings, thought process and behaviors related to these barriers. 4. Patient will identify two changes they are willing to make to overcome these obstacles:    Summary of Patient Progress: with CSW prompting, pt was able to identify her depression as an obstacle in her life.  She continues to only verbalize that her goal is to go home.      Therapeutic Modalities:   Cognitive Behavioral Therapy Solution Focused Therapy Motivational Interviewing Relapse Prevention Therapy  Lurline Idol, LCSW

## 2016-06-30 NOTE — Progress Notes (Signed)
D: Pt denies SI/HI/AVH. Pt is pleasant and cooperative. Pt was able to talk to her sister this evening. Pt stayed in her room most of the evening, pt appeared depressed, but brightened on approach.   A: Pt was offered support and encouragement. Pt was given scheduled medications. Pt was encourage to attend groups. Q 15 minute checks were done for safety.   R:Pt attends groups and interacts well with peers and staff. Pt is taking medication. Pt has no complaints.Pt receptive to treatment and safety maintained on unit.

## 2016-06-30 NOTE — BHH Group Notes (Signed)
[place] Group Notes:  (Nursing/MHT/Case Management/Adjunct)  Date:  06/30/2016  Time:  12:24 AM  Type of Therapy:  Group Therapy  Participation Level:  Did Not Attend    Marylynn Pearson 06/30/2016, 12:24 AM

## 2016-06-30 NOTE — BHH Group Notes (Signed)
[place] Group Notes:  (Nursing/MHT/Case Management/Adjunct)  Date:  06/30/2016  Time:  11:42 PM  Type of Therapy:  Psychoeducational Skills  Participation Level:  Did Not Attend  Participation Quality:Summary of Progress/Problems:  Nehemiah Settle 06/30/2016, 11:42 PM

## 2016-06-30 NOTE — BHH Group Notes (Signed)
[place] Group Notes:  (Nursing/MHT/Case Management/Adjunct)  Date:  06/30/2016  Time:  4:09 PM  Type of Therapy:  Psychoeducational Skills  Participation Level:  Did Not Attend  Charise Killian 06/30/2016, 4:09 PM

## 2016-06-30 NOTE — Plan of Care (Signed)
Problem: Safety: Goal: Periods of time without injury will increase Outcome: Progressing Pt safe on the unit at this time   

## 2016-07-01 NOTE — Plan of Care (Signed)
Problem: Skin Integrity: Goal: Risk for impaired skin integrity will decrease Outcome: Progressing Encouraged to toilet and let staff know of incontinence.

## 2016-07-01 NOTE — Progress Notes (Signed)
AAOx4, occasional confusion in getting lost on the unit. Accepts food and medications without issue. Denies SI/HI/AVH. Pleasant affect. Disheveled appearance. Encouraged to shower today. Remains in room when not at meals despite encouragement to participate in unit activities.

## 2016-07-01 NOTE — Tx Team (Signed)
Interdisciplinary Treatment and Diagnostic Plan Update  07/01/2016 Time of Session: [place] MRN: [id number]  Principal Diagnosis: Severe recurrent major depression without psychotic features [facility])  Secondary Diagnoses: Principal Problem:   Severe recurrent major depression without psychotic features ([place])   Current Medications:  Current Facility-Administered Medications  Medication Dose Route Frequency Provider Last Rate Last Dose  . acetaminophen (TYLENOL) tablet 650 mg  650 mg Oral Q6H PRN Gonzella Lex, MD      . alum & mag hydroxide-simeth (MAALOX/MYLANTA) 200-200-20 MG/5ML suspension 30 mL  30 mL Oral Q4H PRN Gonzella Lex, MD      . busPIRone (BUSPAR) tablet 15 mg  15 mg Oral BID Gonzella Lex, MD   15 mg at 07/01/16 0754  . celecoxib (CELEBREX) capsule 100 mg  100 mg Oral Daily Gonzella Lex, MD   100 mg at 07/01/16 0754  . donepezil (ARICEPT) tablet 5 mg  5 mg Oral QHS Gonzella Lex, MD   5 mg at 06/30/16 2228  . escitalopram (LEXAPRO) tablet 10 mg  10 mg Oral QHS Gonzella Lex, MD   10 mg at 06/30/16 2228  . feeding supplement (ENSURE ENLIVE) (ENSURE ENLIVE) liquid 237 mL  237 mL Oral BID BM Himabindu Ravi, MD   237 mL at 06/30/16 1400  . fentaNYL (SUBLIMAZE) injection 25 mcg  25 mcg Intravenous Q5 min PRN Molli Barrows, MD      . fluticasone [facility] 50 MCG/ACT nasal spray 2 spray  2 spray Each Nare Daily Gonzella Lex, MD   2 spray at 07/01/16 0756  . levothyroxine (SYNTHROID, LEVOTHROID) tablet 75 mcg  75 mcg Oral QAC breakfast Gonzella Lex, MD   75 mcg at 07/01/16 0754  . magnesium hydroxide (MILK OF MAGNESIA) suspension 30 mL  30 mL Oral Daily PRN Gonzella Lex, MD      . ondansetron (ZOFRAN) injection 4 mg  4 mg Intravenous Once PRN Molli Barrows, MD      . ondansetron [facility]) injection 4 mg  4 mg Intravenous Once PRN Gunnar Bulla, MD      . pantoprazole (PROTONIX) EC tablet 40 mg  40 mg Oral QAC breakfast Gonzella Lex, MD   40 mg at 07/01/16 0753   . QUEtiapine (SEROQUEL) tablet 100 mg  100 mg Oral QHS Himabindu Ravi, MD   100 mg at 06/30/16 2228  . tuberculin injection 5 Units  5 Units Intradermal Once Hildred Priest, MD   5 Units at 06/29/16 1406   PTA Medications: Prescriptions Prior to Admission  Medication Sig Dispense Refill Last Dose  . busPIRone (BUSPAR) 15 MG tablet Take 1 tablet (15 mg total) by mouth 2 (two) times daily. 180 tablet 0 06/20/2016 at Unknown time  . celecoxib (CELEBREX) 100 MG capsule TAKE ONE CAPSULE BY MOUTH TWICE A DAY   06/20/2016 at Unknown time  . donepezil (ARICEPT) 5 MG tablet Take 5 mg by mouth at bedtime.    06/20/2016 at Unknown time  . escitalopram (LEXAPRO) 5 MG tablet Take 1 tablet (5 mg total) by mouth at bedtime. (Patient taking differently: Take 10 mg by mouth at bedtime. ) 30 tablet 3 06/20/2016 at Unknown time  . fluticasone (FLONASE) 50 MCG/ACT nasal spray Place 1 spray into both nostrils daily.   06/20/2016 at Unknown time  . hydrOXYzine (VISTARIL) 25 MG capsule Take 1 capsule (25 mg total) by mouth 3 (three) times daily as needed. 30 capsule 0 06/20/2016 at  Unknown time  . KLOR-CON 10 10 MEQ tablet Take 10 mEq by mouth daily.   06/20/2016 at Unknown time  . levothyroxine (SYNTHROID, LEVOTHROID) 75 MCG tablet Take 75 mcg by mouth daily before breakfast.   06/25/2016 at Unknown time  . omeprazole (PRILOSEC) 20 MG capsule Take 20 mg by mouth 2 (two) times daily.   06/27/2016 at Unknown time  . QUEtiapine (SEROQUEL) 100 MG tablet Take 1 tablet (100 mg total) by mouth at bedtime. (Patient taking differently: Take 400 mg by mouth at bedtime. ) 30 tablet 3 06/20/2016 at Unknown time  . traZODone (DESYREL) 100 MG tablet Take 1 tablet (100 mg total) by mouth at bedtime. 30 tablet 3 Past Week at Unknown time    Patient Stressors: Health problems Loss of daughter Medication change or noncompliance  Patient Strengths: Technical sales engineer for treatment/growth Supportive  family/friends  Treatment Modalities: Medication Management, Group therapy, Case management,  1 to 1 session with clinician, Psychoeducation, Recreational therapy.   Physician Treatment Plan for Primary Diagnosis: Severe recurrent major depression without psychotic features ([place]) Long Term Goal(s): Improvement in symptoms so as ready for discharge Improvement in symptoms so as ready for discharge   Short Term Goals: Ability to identify changes in lifestyle to reduce recurrence of condition will improve Ability to verbalize feelings will improve Ability to disclose and discuss suicidal ideas Ability to demonstrate self-control will improve Ability to identify and develop effective coping behaviors will improve Ability to maintain clinical measurements within normal limits will improve Compliance with prescribed medications will improve Ability to identify changes in lifestyle to reduce recurrence of condition will improve Ability to verbalize feelings will improve Ability to disclose and discuss suicidal ideas Ability to demonstrate self-control will improve Ability to identify and develop effective coping behaviors will improve Ability to maintain clinical measurements within normal limits will improve Compliance with prescribed medications will improve  Medication Management: Evaluate patient's response, side effects, and tolerance of medication regimen.  Therapeutic Interventions: 1 to 1 sessions, Unit Group sessions and Medication administration.  Evaluation of Outcomes: Progressing  Physician Treatment Plan for Secondary Diagnosis: Principal Problem:   Severe recurrent major depression without psychotic features ([place])  Long Term Goal(s): Improvement in symptoms so as ready for discharge Improvement in symptoms so as ready for discharge   Short Term Goals: Ability to identify changes in lifestyle to reduce recurrence of condition will improve Ability to verbalize feelings  will improve Ability to disclose and discuss suicidal ideas Ability to demonstrate self-control will improve Ability to identify and develop effective coping behaviors will improve Ability to maintain clinical measurements within normal limits will improve Compliance with prescribed medications will improve Ability to identify changes in lifestyle to reduce recurrence of condition will improve Ability to verbalize feelings will improve Ability to disclose and discuss suicidal ideas Ability to demonstrate self-control will improve Ability to identify and develop effective coping behaviors will improve Ability to maintain clinical measurements within normal limits will improve Compliance with prescribed medications will improve     Medication Management: Evaluate patient's response, side effects, and tolerance of medication regimen.  Therapeutic Interventions: 1 to 1 sessions, Unit Group sessions and Medication administration.  Evaluation of Outcomes: Progressing   RN Treatment Plan for Primary Diagnosis: Severe recurrent major depression without psychotic features ([place]) Long Term Goal(s): Knowledge of disease and therapeutic regimen to maintain health will improve  Short Term Goals: Ability to verbalize feelings will improve, Ability to identify and develop effective coping  behaviors will improve and Compliance with prescribed medications will improve  Medication Management: RN will administer medications as ordered by provider, will assess and evaluate patient's response and provide education to patient for prescribed medication. RN will report any adverse and/or side effects to prescribing provider.  Therapeutic Interventions: 1 on 1 counseling sessions, Psychoeducation, Medication administration, Evaluate responses to treatment, Monitor vital signs and CBGs as ordered, Perform/monitor CIWA, COWS, AIMS and Fall Risk screenings as ordered, Perform wound care treatments as  ordered.  Evaluation of Outcomes: Progressing   LCSW Treatment Plan for Primary Diagnosis: Severe recurrent major depression without psychotic features ([place]) Long Term Goal(s): Safe transition to appropriate next level of care at discharge, Engage patient in therapeutic group addressing interpersonal concerns.  Short Term Goals: Engage patient in aftercare planning with referrals and resources, Increase social support and Increase skills for wellness and recovery  Therapeutic Interventions: Assess for all discharge needs, 1 to 1 time with Social worker, Explore available resources and support systems, Assess for adequacy in community support network, Educate family and significant other(s) on suicide prevention, Complete Psychosocial Assessment, Interpersonal group therapy.  Evaluation of Outcomes: Progressing    Recreational Therapy Treatment Plan for Primary Diagnosis: Severe recurrent major depression without psychotic features ([place]) Long Term Goal(s): Patient will participate in recreation therapy treatment in at least 2 group sessions without prompting from LRT  Short Term Goals: Increase stress management skills  Treatment Modalities: Group Therapy and Individual Treatment Sessions  Therapeutic Interventions: Psychoeducation  Evaluation of Outcomes: Progressing   Progress in Treatment: Attending groups:  occasionally Participating in groups: yes Taking medication as prescribed: Yes. Toleration medication: Yes. Family/Significant other contact made: Yes, individual(s) contacted:  sister Patient understands diagnosis: Yes. Discussing patient identified problems/goals with staff: Yes. Medical problems stabilized or resolved: Yes. Denies suicidal/homicidal ideation: Yes. Issues/concerns per patient self-inventory: No. Other: none  New problem(s) identified: No, Describe:  none  New Short Term/Long Term Goal(s): Pt continues to verbalize that her only goal is to go  home.  Discharge Plan or Barriers: Outpt ECT and follow up at [place].  Reason for Continuation of Hospitalization: Depression Other; describe ECT treatments  Estimated Length of Stay: 3 days  Attendees: Patient: Riley Papin 07/01/2016   Physician: Dr Weber Cooks, MD 07/01/2016   Nursing: Floyde Parkins, RN 07/01/2016   RN Care Manager: 07/01/2016   Social Worker: Lurline Idol, LCSW 07/01/2016   Recreational Therapist: Drue Flirt, LRT/CTRS  07/01/2016   Other:  07/01/2016   Other:    Other: 07/01/2016     Scribe for Treatment Team: Joanne Chars, LCSW 07/01/2016 10:30 AM

## 2016-07-01 NOTE — Progress Notes (Signed)
PPD read, 76m induration.

## 2016-07-01 NOTE — [facility] Group Notes (Signed)
[place] LCSW Group Therapy  07/01/2016 5:07 PM   Type of Therapy/Topic:  Group Therapy:  Feelings about Diagnosis  Participation Level:  Minimal   Mood: Reports okay mood   Description of Group:    This group will allow patients to explore their thoughts and feelings about diagnoses they have received. Patients will be guided to explore their level of understanding and acceptance of these diagnoses. Facilitator will encourage patients to process their thoughts and feelings about the reactions of others to their diagnosis, and will guide patients in identifying ways to discuss their diagnosis with significant others in their lives. This group will be process-oriented, with patients participating in exploration of their own experiences as well as giving and receiving support and challenge from other group members.   Therapeutic Goals: 1. Patient will demonstrate understanding of diagnosis as evidence by identifying two or more symptoms of the disorder:  2. Patient will be able to express two feelings regarding the diagnosis 3. Patient will demonstrate ability to communicate their needs through discussion and/or role plays  Summary of Patient Progress:  Pt able to meet therapeutic goals listed above.    Therapeutic Modalities:   Cognitive Behavioral Therapy Brief Therapy Feelings Identification     August Saucer, MSW, LCSW 07/01/2016, 5:07 PM

## 2016-07-01 NOTE — Progress Notes (Signed)
Recreation Therapy Notes  Date: 03.13.18 Time: 9:30 am Location: Craft Room  Group Topic: Coping Skills  Goal Area(s) Addresses:  Patient will write at least one healthy coping skill. Patient will verbalize ways to use healthy coping skills.  Behavioral Response: Did not attend  Intervention: Coping Skills Alphabet  Activity: Patients were given a Coping Skills Alphabet worksheet and were instructed to write healthy coping skills for each letter of the alphabet.  Education: LRT educated patients on healthy coping skills.  Education Outcome: Patient did not attend group.  Clinical Observations/Feedback: Patient did not attend group.  Leonette Monarch, LRT/CTRS 07/01/2016 10:12 AM

## 2016-07-01 NOTE — Progress Notes (Signed)
[facility] MD Progress Note  07/01/2016 7:55 PM Cassidy [name]  MRN:  [id number] Subjective:Patient is a 76 yo woman with severe depression , decompensating over past few months with poor nutrition and not responsive to multi ple medication changes, seen by outpatient psychiatrist at [facility]. Admitted for stabilization .   Patient reports being unhappy being in the hospital. Per her sister she has not showered in months.She was initially resistant but showered with help of tech. She has not been eating, consumption less than 10 % of her meals. She is compliant with medications. Minimal participation in groups. Continues to say that her grandson is the only one keeping her from dying.  Follow-up for Friday the ninth. Patient had third ECT treatment today which once again went well and appeared to be well tolerated. Patient continues to irritably focused on discharge plans. Social work spoke with her grandson today who is strongly supportive of her continued hospitalization. Family will be coming in over the weekend. Patient still spends too much time in bed for my liking although she says that she is feeling better.  3/10 she says she is unhappy because she is still in the hospital. She says she wants to go home. She grades her mood as an 8 out of 10 x 10 being the best. Says that she has been eating and has been attending groups. She denies suicidality or homicidality. She also denies having any hallucinations. She denies having any side effects from medications. She denies any physical complaints. He states that ECT has been helping her. She was encouraged to discuss discharge with her primary psychiatrist on Monday  Per staff she is isolating herself in the room  3/11 patient was more engaged in the assessment today. She says she is doing very well denies having any issues or concerns. Denies side effects to medications or having any physical complaints. Says that her mood is improved, denies suicidality,  homicidality or auditory or visual hallucinations. Says that she has been in many sleeping very well.  Follow-up Tuesday the 13th. Patient met with full treatment team. Claims that she is feeling better. She is still disheveled not getting out of bed and not taking care of her basic hygiene. Affect a little bit more reactive. Still focused entirely on wanting to be discharged.  Per nursing: Pt denies SI/HI/AVH. Denies pain. Stated she is hopeful to go home on Monday. Pt mostly isolated to room this shift. States it is too loud in dayroom. Medication compliant. Voices no other concerns at this time. Encouragement and support provided. Safety maintained. Will continue to monitor.  D: Patient stated slept good last night .Stated appetite fair and energy level  Is normal. Stated concentration is good .Denies  Depression , hopeless and anxiety . Denies suicidal  homicidal ideations  .  No auditory hallucinations  No pain concerns . Appropriate ADL'S. Interacting with peers and staff. Patient voice of wanting to go home . Stated she had talked with DR. Alexzandra Bilton  Early in the week and projected date Monday after ECT . Limited interaction  With her peers . Patient stayed closed  To her room this shift     A.Encourage patient participation with unit programming . Instruction  Given on  Medication , verbalize understanding. R: Voice no other concerns. Staff continue to monitor   Principal Problem: Severe recurrent major depression without psychotic features [facility]) Diagnosis:   Patient Active Problem List   Diagnosis Date Noted  . Severe recurrent major depression without  psychotic features ([place]) [F33.2] 06/19/2016  . Hepatic encephalopathy ([place]) [K72.90] 08/13/2015  . Thrombocytopenia ([place]) [D69.6] 07/23/2015  . Amnesia [R41.3] 03/20/2015  . Major depression in remission ([place]) [F32.5] 03/12/2015  . Gonalgia [M25.569] 01/05/2015  . Absolute anemia [D64.9] 11/27/2014  . Arthritis [M19.90] 11/27/2014  . Acid  reflux [K21.9] 11/27/2014  . Depression, major, recurrent, in partial remission ([place]) [F33.41] 11/27/2014  . Encounter for general adult medical examination without abnormal findings [Z00.00] 08/25/2014  . Chronic LBP [M54.5, G89.29] 08/15/2014  . 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 Infraspinatus tenosynovitis [M65.819] 05/01/2014  . Other synovitis and tenosynovitis, right shoulder [M65.811] 05/01/2014  . Difficulty in walking [R26.2] 11/30/2013  . Extreme obesity ([place]) [E66.01] 11/30/2013  . Morbid obesity ([place]) [E66.01] 11/30/2013  . Arthritis, degenerative [M19.90] 10/01/2013  . Steatohepatitis [K75.81] 10/01/2013  . Orthostasis [I95.1] 10/01/2013  . Appendicular ataxia [R27.0] 09/26/2013  . Fall [W19.XXXA] 09/26/2013  . Dizziness [R42] 09/07/2013  . Osteoporosis with fracture [M80.00XA] 09/06/2013  . Clinical depression [F32.9] 03/25/2012  . Adult hypothyroidism [E03.9] 03/25/2012  . Esophageal stenosis [K22.2] 07/03/2009  . Back ache [M54.9] 03/08/2003  . Anxiety state [F41.1] 12/27/2002   Total Time spent with patient: 20 minutes  Past Psychiatric History: Patient has had prior psychiatric admissions here at our hospital before. Denies suicide attempts. Patient has been on multiple antidepressants without consistent benefit. She is currently on Lexapro and BuSpar and Seroquel  Past Medical History:  Past Medical History:  Diagnosis Date  . Anxiety   . Depression   . Fatty liver   . GERD (gastroesophageal reflux disease)   . Hypothyroidism   . Obesity   . Thyroid disease     Past Surgical History:  Procedure Laterality Date  . APPENDECTOMY    . ESOPHAGOGASTRODUODENOSCOPY (EGD) WITH PROPOFOL N/A 12/20/2014   Procedure: ESOPHAGOGASTRODUODENOSCOPY (EGD) WITH PROPOFOL;  Surgeon: Manya Silvas, MD;  Location: [facility] ENDOSCOPY;  Service: Endoscopy;  Laterality: N/A;  . SAVORY DILATION N/A 12/20/2014   Procedure: SAVORY DILATION;  Surgeon:  Manya Silvas, MD;  Location: [facility] ENDOSCOPY;  Service: Endoscopy;  Laterality: N/A;  . TONSILLECTOMY     Family History:  Family History  Problem Relation Age of Onset  . Diabetes Mother   . COPD Mother   . Kidney disease Mother   . Depression Mother   . Heart attack Father   . Aneurysm Father   . Anxiety disorder Sister   . Depression Sister   . Diabetes Sister   . Hypertension Sister   . Atrial fibrillation Brother   . Spinal muscular atrophy Brother   . Breast cancer Sister   . Bone cancer Sister   . Depression Sister   . Anxiety disorder Sister   . Breast cancer Sister   . Colon cancer Sister   . Depression Sister   . Diabetes Sister   . COPD Sister   . Depression Sister   . Anxiety disorder Sister   . Diabetes Brother   . Depression Brother    Family Psychiatric  History: see above Social History:  History  Alcohol Use No     History  Drug Use No    Social History   Social History  . Marital status: Widowed    Spouse name: N/A  . Number of children: N/A  . Years of education: N/A   Social History Main Topics  . Smoking status: Never Smoker  . Smokeless tobacco: Never Used  . Alcohol use  No  . Drug use: No  . Sexual activity: Not Currently    Birth control/ protection: Post-menopausal   Other Topics Concern  . None   Social History Narrative  . None   Additional Social History:                      Sleep: Good  Appetite:  Poor  Current Medications: Current Facility-Administered Medications  Medication Dose Route Frequency Provider Last Rate Last Dose  . acetaminophen (TYLENOL) tablet 650 mg  650 mg Oral Q6H PRN Gonzella Lex, MD      . alum & mag hydroxide-simeth (MAALOX/MYLANTA) 200-200-20 MG/5ML suspension 30 mL  30 mL Oral Q4H PRN Gonzella Lex, MD      . busPIRone (BUSPAR) tablet 15 mg  15 mg Oral BID Gonzella Lex, MD   15 mg at 07/01/16 1703  . celecoxib (CELEBREX) capsule 100 mg  100 mg Oral Daily Gonzella Lex, MD    100 mg at 07/01/16 0754  . donepezil (ARICEPT) tablet 5 mg  5 mg Oral QHS Gonzella Lex, MD   5 mg at 06/30/16 2228  . escitalopram (LEXAPRO) tablet 10 mg  10 mg Oral QHS Gonzella Lex, MD   10 mg at 06/30/16 2228  . feeding supplement (ENSURE ENLIVE) (ENSURE ENLIVE) liquid 237 mL  237 mL Oral BID BM Himabindu Ravi, MD   237 mL at 07/01/16 1400  . fentaNYL (SUBLIMAZE) injection 25 mcg  25 mcg Intravenous Q5 min PRN Molli Barrows, MD      . fluticasone [facility]) 50 MCG/ACT nasal spray 2 spray  2 spray Each Nare Daily Gonzella Lex, MD   2 spray at 07/01/16 0756  . levothyroxine (SYNTHROID, LEVOTHROID) tablet 75 mcg  75 mcg Oral QAC breakfast Gonzella Lex, MD   75 mcg at 07/01/16 0754  . magnesium hydroxide (MILK OF MAGNESIA) suspension 30 mL  30 mL Oral Daily PRN Gonzella Lex, MD      . ondansetron (ZOFRAN) injection 4 mg  4 mg Intravenous Once PRN Molli Barrows, MD      . ondansetron [facility]) injection 4 mg  4 mg Intravenous Once PRN Gunnar Bulla, MD      . pantoprazole (PROTONIX) EC tablet 40 mg  40 mg Oral QAC breakfast Gonzella Lex, MD   40 mg at 07/01/16 0753  . QUEtiapine (SEROQUEL) tablet 100 mg  100 mg Oral QHS Himabindu Ravi, MD   100 mg at 06/30/16 2228    Lab Results:  Results for orders placed or performed during the hospital encounter of 06/20/16 (from the past 48 hour(s))  Glucose, capillary     Status: None   Collection Time: 06/30/16  6:40 AM  Result Value Ref Range   Glucose-Capillary 97 65 - 99 mg/dL    Blood Alcohol level:  Lab Results  Component Value Date   ETH <5 95/28/4132    Metabolic Disorder Labs: Lab Results  Component Value Date   HGBA1C 5.3 06/21/2016   MPG 105 06/21/2016   No results found for: PROLACTIN Lab Results  Component Value Date   CHOL 145 06/21/2016   TRIG 51 06/21/2016   HDL 44 06/21/2016   CHOLHDL 3.3 06/21/2016   VLDL 10 06/21/2016   LDLCALC 91 06/21/2016   LDLCALC 70 08/22/2013    Physical Findings: AIMS:  , ,  ,  ,     CIWA:    COWS:  Musculoskeletal: Strength & Muscle Tone: weak, some generalized atrophy due to patient limiting her mobility Gait & Station: normal Patient leans: N/A  Psychiatric Specialty Exam: Physical Exam  Nursing note and vitals reviewed. Constitutional: She appears well-developed and well-nourished.  HENT:  Head: Normocephalic and atraumatic.  Eyes: Conjunctivae are normal. Pupils are equal, round, and reactive to light.  Neck: Normal range of motion.  Cardiovascular: Regular rhythm and normal heart sounds.   Respiratory: Effort normal. No respiratory distress.  GI: Soft.  Musculoskeletal: Normal range of motion.  Neurological: She is alert.  Skin: Skin is warm and dry.  Psychiatric: Her affect is blunt. Her speech is delayed. She is slowed. She expresses impulsivity. She expresses no suicidal ideation. She exhibits abnormal recent memory.    Review of Systems  Constitutional: Negative.   HENT: Negative.   Eyes: Negative.   Respiratory: Negative.   Cardiovascular: Negative.   Gastrointestinal: Negative.   Musculoskeletal: Negative.   Skin: Negative.   Neurological: Negative.   Psychiatric/Behavioral: Positive for depression. Negative for hallucinations, memory loss, substance abuse and suicidal ideas. The patient is nervous/anxious. The patient does not have insomnia.     Blood pressure (!) 150/61, pulse (!) 102, temperature 98 F (36.7 C), temperature source Oral, resp. rate 16, height 5' 2"  (1.575 m), weight 78.5 kg (173 lb), SpO2 97 %.Body mass index is 31.64 kg/m.  General Appearance: Casual  Eye Contact:  Fair  Speech:  Slow, soft, minimal  Volume:  Decreased  Mood:  Euthymic  Affect:  Congruent  Thought Process:  Coherent  Orientation:  Full (Time, Place, and Person)  Thought Content:  Rumination  Suicidal Thoughts:  Yes.  without intent/plan  Homicidal Thoughts:  No  Memory:  Immediate;   Fair Recent;   Fair Remote;   Fair  Judgement:   Impaired  Insight:  Lacking  Psychomotor Activity:  Psychomotor Retardation  Concentration:  Concentration: Fair and Attention Span: Fair  Recall:  [employer] of Knowledge:  Fair  Language:  Fair  Akathisia:  No  Handed:  Right  AIMS (if indicated):     Assets:  Communication Skills Housing  ADL's:  Impaired  Cognition:  WNL  Sleep:  Number of Hours: 7.45     Treatment Plan Summary: Daily contact with patient to assess and evaluate symptoms and progress in treatment and Medication management   Major Depressive Disorder Continue increased dose of Lexapro and current seroquel. Consult to ECT since patient not responding to multiple medications and decompensating.  Anxiety- Continue Buspar  Malnutrition- Nutrition consult done, recommendations being followed with ensure supplements.  Hypothyroidism- synthroid  Continue Aricept per primary care.  GERD- Pantoprazole  Labs-TSH wnl, lipid panel wnl, obtain UA  Disposition- After ECT treatment to outpatient Psychiatrist. Patient may need assisted living or nursing home based on response to ECT.  Continue ECT 3 times a week into next week. So far appears to be tolerating it well and we are hoping to start seeing some clear improvement. No change to medication. Explained to patient the rationale for continued hospitalization and encouraged her to be as active as possible and to eat well.   3/10 patient reports doing well today. She did not have major issues or complaints. No medication changes will be made. Vital signs in nursing notes were reviewed.  Stable and improving  3/11 patient is doing well today. No changes will be made in her medication regimen. She seems to be improving. Patient feels better and says she  is looking forward to be discharged soon next week. I encouraged her to discuss this with Dr. Lissa Hoard packs tomorrow. Patient has been compliant with her medications and has maintained a fair to good oral intake  Made it  pretty clear to the patient today that I thought that she really needed more treatment in order to get well and that I was going to probably insist on keeping her here until the end of the week. Next ECT treatment tomorrow morning. Meanwhile no change in medication for now. Alethia Berthold, MD 07/01/2016, 7:55 PM

## 2016-07-02 ENCOUNTER — Encounter: Payer: Self-pay | Admitting: *Deleted

## 2016-07-02 ENCOUNTER — Inpatient Hospital Stay: Payer: Medicare Other | Admitting: Anesthesiology

## 2016-07-02 ENCOUNTER — Other Ambulatory Visit: Payer: Self-pay | Admitting: Psychiatry

## 2016-07-02 LAB — GLUCOSE, CAPILLARY: Glucose-Capillary: 93 mg/dL (ref 65–99)

## 2016-07-02 MED ORDER — SODIUM CHLORIDE 0.9 % IV SOLN
500.0000 mL | Freq: Once | INTRAVENOUS | Status: AC
  Administered 2016-07-02: 1000 mL via INTRAVENOUS

## 2016-07-02 MED ORDER — SUCCINYLCHOLINE CHLORIDE 200 MG/10ML IV SOSY
PREFILLED_SYRINGE | INTRAVENOUS | Status: DC | PRN
  Administered 2016-07-02: 80 mg via INTRAVENOUS

## 2016-07-02 MED ORDER — METHOHEXITAL SODIUM 100 MG/10ML IV SOSY
PREFILLED_SYRINGE | INTRAVENOUS | Status: DC | PRN
  Administered 2016-07-02: 80 mg via INTRAVENOUS

## 2016-07-02 MED ORDER — LABETALOL HCL 5 MG/ML IV SOLN
INTRAVENOUS | Status: AC
  Filled 2016-07-02: qty 4

## 2016-07-02 MED ORDER — LABETALOL HCL 5 MG/ML IV SOLN
INTRAVENOUS | Status: DC | PRN
  Administered 2016-07-02 (×2): 10 mg via INTRAVENOUS

## 2016-07-02 MED ORDER — SUCCINYLCHOLINE CHLORIDE 20 MG/ML IJ SOLN
INTRAMUSCULAR | Status: AC
  Filled 2016-07-02: qty 1

## 2016-07-02 MED ORDER — SODIUM CHLORIDE 0.9 % IV SOLN
INTRAVENOUS | Status: DC | PRN
  Administered 2016-07-02: 11:00:00 via INTRAVENOUS

## 2016-07-02 NOTE — B[facility] Group Notes (Signed)
  [place] LCSW Group Therapy Note  Date/Time: 07/02/16, 1300  Type of Therapy/Topic:  Group Therapy:  Emotion Regulation  Participation Level:  Minimal   Mood:pleasant  Description of Group:    The purpose of this group is to assist patients in learning to regulate negative emotions and experience positive emotions. Patients will be guided to discuss ways in which they have been vulnerable to their negative emotions. These vulnerabilities will be juxtaposed with experiences of positive emotions or situations, and patients challenged to use positive emotions to combat negative ones. Special emphasis will be placed on coping with negative emotions in conflict situations, and patients will process healthy conflict resolution skills.  Therapeutic Goals: 1. Patient will identify two positive emotions or experiences to reflect on in order to balance out negative emotions:  2. Patient will label two or more emotions that they find the most difficult to experience:  3. Patient will be able to demonstrate positive conflict resolution skills through discussion or role plays:   Summary of Patient Progress: Cassidy [name] attended group but continues to be unable to engage in any sort of discussion.  No matter what question is posed to pt, she responds that "I just want to go home."  Pt, with CSW assistance, was able to state that anger is an emotion that she struggles with.       Therapeutic Modalities:   Cognitive Behavioral Therapy Feelings Identification Dialectical Behavioral Therapy  Lurline Idol, LCSW

## 2016-07-02 NOTE — H&P (Signed)
Cassidy [name] is an 76 y.o. female.   Chief Complaint: Patient complains of unhappiness at being in the hospital. Depression and anxiety. Denies current suicidal thoughts. HPI: Recurrent severe depression not responsive to medication currently in the hospital receiving index course ECT  Past Medical History:  Diagnosis Date  . Anxiety   . Depression   . Fatty liver   . GERD (gastroesophageal reflux disease)   . Hypothyroidism   . Obesity   . Thyroid disease     Past Surgical History:  Procedure Laterality Date  . APPENDECTOMY    . ESOPHAGOGASTRODUODENOSCOPY (EGD) WITH PROPOFOL N/A 12/20/2014   Procedure: ESOPHAGOGASTRODUODENOSCOPY (EGD) WITH PROPOFOL;  Surgeon: Manya Silvas, MD;  Location[facility] ENDOSCOPY;  Service: Endoscopy;  Laterality: N/A;  . SAVORY DILATION N/A 12/20/2014   Procedure: SAVORY DILATION;  Surgeon: Manya Silvas, MD;  Location: [facility] ENDOSCOPY;  Service: Endoscopy;  Laterality: N/A;  . TONSILLECTOMY      Family History  Problem Relation Age of Onset  . Diabetes Mother   . COPD Mother   . Kidney disease Mother   . Depression Mother   . Heart attack Father   . Aneurysm Father   . Anxiety disorder Sister   . Depression Sister   . Diabetes Sister   . Hypertension Sister   . Atrial fibrillation Brother   . Spinal muscular atrophy Brother   . Breast cancer Sister   . Bone cancer Sister   . Depression Sister   . Anxiety disorder Sister   . Breast cancer Sister   . Colon cancer Sister   . Depression Sister   . Diabetes Sister   . COPD Sister   . Depression Sister   . Anxiety disorder Sister   . Diabetes Brother   . Depression Brother    Social History:  reports that she has never smoked. She has never used smokeless tobacco. She reports that she does not drink alcohol or use drugs.  Allergies:  Allergies  Allergen Reactions  . Sulfa Antibiotics Shortness Of Breath    Medications Prior to Admission  Medication Sig Dispense Refill  .  busPIRone (BUSPAR) 15 MG tablet Take 1 tablet (15 mg total) by mouth 2 (two) times daily. 180 tablet 0  . celecoxib (CELEBREX) 100 MG capsule TAKE ONE CAPSULE BY MOUTH TWICE A DAY    . donepezil (ARICEPT) 5 MG tablet Take 5 mg by mouth at bedtime.     Marland Kitchen escitalopram (LEXAPRO) 5 MG tablet Take 1 tablet (5 mg total) by mouth at bedtime. (Patient taking differently: Take 10 mg by mouth at bedtime. ) 30 tablet 3  . fluticasone (FLONASE) 50 MCG/ACT nasal spray Place 1 spray into both nostrils daily.    . hydrOXYzine (VISTARIL) 25 MG capsule Take 1 capsule (25 mg total) by mouth 3 (three) times daily as needed. 30 capsule 0  . KLOR-CON 10 10 MEQ tablet Take 10 mEq by mouth daily.    Marland Kitchen levothyroxine (SYNTHROID, LEVOTHROID) 75 MCG tablet Take 75 mcg by mouth daily before breakfast.    . omeprazole (PRILOSEC) 20 MG capsule Take 20 mg by mouth 2 (two) times daily.    . QUEtiapine (SEROQUEL) 100 MG tablet Take 1 tablet (100 mg total) by mouth at bedtime. (Patient taking differently: Take 400 mg by mouth at bedtime. ) 30 tablet 3  . traZODone (DESYREL) 100 MG tablet Take 1 tablet (100 mg total) by mouth at bedtime. 30 tablet 3    Results for  orders placed or performed during the hospital encounter of 06/20/16 (from the past 48 hour(s))  Glucose, capillary     Status: None   Collection Time: 07/02/16  6:32 AM  Result Value Ref Range   Glucose-Capillary 93 65 - 99 mg/dL   No results found.  Review of Systems  Constitutional: Positive for weight loss.  HENT: Negative.   Eyes: Negative.   Respiratory: Negative.   Cardiovascular: Negative.   Gastrointestinal: Negative.   Musculoskeletal: Negative.   Skin: Negative.   Neurological: Positive for weakness.  Psychiatric/Behavioral: Negative for depression, hallucinations, memory loss, substance abuse and suicidal ideas. The patient is nervous/anxious. The patient does not have insomnia.     Blood pressure (!) 132/59, pulse 88, temperature 97.9 F (36.6  C), temperature source Oral, resp. rate 18, height 5' 2"  (1.575 m), weight 80.7 kg (178 lb), SpO2 95 %. Physical Exam  Nursing note and vitals reviewed. Constitutional: She appears well-developed and well-nourished.  HENT:  Head: Normocephalic and atraumatic.  Eyes: Conjunctivae are normal. Pupils are equal, round, and reactive to light.  Neck: Normal range of motion.  Cardiovascular: Regular rhythm and normal heart sounds.   Respiratory: Effort normal. No respiratory distress.  GI: Soft.  Musculoskeletal: Normal range of motion.  Neurological: She is alert.  Skin: Skin is warm and dry.  Psychiatric: Her speech is delayed. She is slowed and withdrawn. Cognition and memory are impaired. She expresses impulsivity. She exhibits a depressed mood. She expresses no suicidal ideation.     Assessment/Plan Possible early partial improvement but with coarse interrupted by 1 day of patient's refusal. Plan to continue in the hospital at least through this week.  Alethia Berthold, MD 07/02/2016, 10:36 AM

## 2016-07-02 NOTE — Progress Notes (Signed)
[facility] MD Progress Note  07/02/2016 6:36 PM Cassidy [name]  MRN:  [id number] Subjective:Patient is a 76 yo woman with severe depression , decompensating over past few months with poor nutrition and not responsive to multi ple medication changes, seen by outpatient psychiatrist at [facility]. Admitted for stabilization .   Patient reports being unhappy being in the hospital. Per her sister she has not showered in months.She was initially resistant but showered with help of tech. She has not been eating, consumption less than 10 % of her meals. She is compliant with medications. Minimal participation in groups. Continues to say that her grandson is the only one keeping her from dying.  Follow-up for Friday the ninth. Patient had third ECT treatment today which once again went well and appeared to be well tolerated. Patient continues to irritably focused on discharge plans. Social work spoke with her grandson today who is strongly supportive of her continued hospitalization. Family will be coming in over the weekend. Patient still spends too much time in bed for my liking although she says that she is feeling better.  3/10 she says she is unhappy because she is still in the hospital. She says she wants to go home. She grades her mood as an 8 out of 10 x 10 being the best. Says that she has been eating and has been attending groups. She denies suicidality or homicidality. She also denies having any hallucinations. She denies having any side effects from medications. She denies any physical complaints. He states that ECT has been helping her. She was encouraged to discuss discharge with her primary psychiatrist on Monday  Per staff she is isolating herself in the room  3/11 patient was more engaged in the assessment today. She says she is doing very well denies having any issues or concerns. Denies side effects to medications or having any physical complaints. Says that her mood is improved, denies suicidality,  homicidality or auditory or visual hallucinations. Says that she has been in many sleeping very well.  Follow-up Wednesday the 14th. Patient had ECT this morning with which went well. She is showing a little bit of improvement I think in her affect although she remains pretty withdrawn most of the time and is not taking care of her hygiene. This evening her sister called up asking that the patient be discharged. I reminded the sister that we had had an explicit agreement the other day that the patient would be here till Friday. Sister had clearly forgotten this.  Per nursing: Pt denies SI/HI/AVH. Denies pain. Stated she is hopeful to go home on Monday. Pt mostly isolated to room this shift. States it is too loud in dayroom. Medication compliant. Voices no other concerns at this time. Encouragement and support provided. Safety maintained. Will continue to monitor.  D: Patient stated slept good last night .Stated appetite fair and energy level  Is normal. Stated concentration is good .Denies  Depression , hopeless and anxiety . Denies suicidal  homicidal ideations  .  No auditory hallucinations  No pain concerns . Appropriate ADL'S. Interacting with peers and staff. Patient voice of wanting to go home . Stated she had talked with DR. Elana Jian  Early in the week and projected date Monday after ECT . Limited interaction  With her peers . Patient stayed closed  To her room this shift     A.Encourage patient participation with unit programming . Instruction  Given on  Medication , verbalize understanding. R: Voice no other concerns.  Staff continue to monitor   Principal Problem: Severe recurrent major depression without psychotic features [facility]) Diagnosis:   Patient Active Problem List   Diagnosis Date Noted  . Severe recurrent major depression without psychotic features ([place]) [F33.2] 06/19/2016  . Hepatic encephalopathy ([place]) [K72.90] 08/13/2015  . Thrombocytopenia ([place]) [D69.6] 07/23/2015  . Amnesia  [R41.3] 03/20/2015  . Major depression in remission ([place]) [F32.5] 03/12/2015  . Gonalgia [M25.569] 01/05/2015  . Absolute anemia [D64.9] 11/27/2014  . Arthritis [M19.90] 11/27/2014  . Acid reflux [K21.9] 11/27/2014  . Depression, major, recurrent, in partial remission ([place]) [F33.41] 11/27/2014  . Encounter for general adult medical examination without abnormal findings [Z00.00] 08/25/2014  . Chronic LBP [M54.5, G89.29] 08/15/2014  . 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 Infraspinatus tenosynovitis [M65.819] 05/01/2014  . Other synovitis and tenosynovitis, right shoulder [M65.811] 05/01/2014  . Difficulty in walking [R26.2] 11/30/2013  . Extreme obesity ([place]) [E66.01] 11/30/2013  . Morbid obesity ([place]) [E66.01] 11/30/2013  . Arthritis, degenerative [M19.90] 10/01/2013  . Steatohepatitis [K75.81] 10/01/2013  . Orthostasis [I95.1] 10/01/2013  . Appendicular ataxia [R27.0] 09/26/2013  . Fall [W19.XXXA] 09/26/2013  . Dizziness [R42] 09/07/2013  . Osteoporosis with fracture [M80.00XA] 09/06/2013  . Clinical depression [F32.9] 03/25/2012  . Adult hypothyroidism [E03.9] 03/25/2012  . Esophageal stenosis [K22.2] 07/03/2009  . Back ache [M54.9] 03/08/2003  . Anxiety state [F41.1] 12/27/2002   Total Time spent with patient: 20 minutes  Past Psychiatric History: Patient has had prior psychiatric admissions here at our hospital before. Denies suicide attempts. Patient has been on multiple antidepressants without consistent benefit. She is currently on Lexapro and BuSpar and Seroquel  Past Medical History:  Past Medical History:  Diagnosis Date  . Anxiety   . Depression   . Fatty liver   . GERD (gastroesophageal reflux disease)   . Hypothyroidism   . Obesity   . Thyroid disease     Past Surgical History:  Procedure Laterality Date  . APPENDECTOMY    . ESOPHAGOGASTRODUODENOSCOPY (EGD) WITH PROPOFOL N/A 12/20/2014   Procedure: ESOPHAGOGASTRODUODENOSCOPY  (EGD) WITH PROPOFOL;  Surgeon: Manya Silvas, MD;  Location: [facility] ENDOSCOPY;  Service: Endoscopy;  Laterality: N/A;  . SAVORY DILATION N/A 12/20/2014   Procedure: SAVORY DILATION;  Surgeon: Manya Silvas, MD;  Location[facility] ENDOSCOPY;  Service: Endoscopy;  Laterality: N/A;  . TONSILLECTOMY     Family History:  Family History  Problem Relation Age of Onset  . Diabetes Mother   . COPD Mother   . Kidney disease Mother   . Depression Mother   . Heart attack Father   . Aneurysm Father   . Anxiety disorder Sister   . Depression Sister   . Diabetes Sister   . Hypertension Sister   . Atrial fibrillation Brother   . Spinal muscular atrophy Brother   . Breast cancer Sister   . Bone cancer Sister   . Depression Sister   . Anxiety disorder Sister   . Breast cancer Sister   . Colon cancer Sister   . Depression Sister   . Diabetes Sister   . COPD Sister   . Depression Sister   . Anxiety disorder Sister   . Diabetes Brother   . Depression Brother    Family Psychiatric  History: see above Social History:  History  Alcohol Use No     History  Drug Use No    Social History   Social History  . Marital status: Widowed    Spouse name:  N/A  . Number of children: N/A  . Years of education: N/A   Social History Main Topics  . Smoking status: Never Smoker  . Smokeless tobacco: Never Used  . Alcohol use No  . Drug use: No  . Sexual activity: Not Currently    Birth control/ protection: Post-menopausal   Other Topics Concern  . None   Social History Narrative  . None   Additional Social History:                      Sleep: Good  Appetite:  Poor  Current Medications: Current Facility-Administered Medications  Medication Dose Route Frequency Provider Last Rate Last Dose  . acetaminophen (TYLENOL) tablet 650 mg  650 mg Oral Q6H PRN Gonzella Lex, MD      . alum & mag hydroxide-simeth (MAALOX/MYLANTA) 200-200-20 MG/5ML suspension 30 mL  30 mL Oral Q4H PRN  Gonzella Lex, MD      . busPIRone (BUSPAR) tablet 15 mg  15 mg Oral BID Gonzella Lex, MD   15 mg at 07/02/16 1723  . celecoxib (CELEBREX) capsule 100 mg  100 mg Oral Daily Gonzella Lex, MD   100 mg at 07/02/16 1724  . donepezil (ARICEPT) tablet 5 mg  5 mg Oral QHS Gonzella Lex, MD   5 mg at 07/01/16 2157  . escitalopram (LEXAPRO) tablet 10 mg  10 mg Oral QHS Gonzella Lex, MD   10 mg at 07/01/16 2156  . feeding supplement (ENSURE ENLIVE) (ENSURE ENLIVE) liquid 237 mL  237 mL Oral BID BM Himabindu Ravi, MD   237 mL at 07/01/16 1400  . fentaNYL (SUBLIMAZE) injection 25 mcg  25 mcg Intravenous Q5 min PRN Molli Barrows, MD      . fluticasone [facility]) 50 MCG/ACT nasal spray 2 spray  2 spray Each Nare Daily Gonzella Lex, MD   2 spray at 07/01/16 0756  . levothyroxine (SYNTHROID, LEVOTHROID) tablet 75 mcg  75 mcg Oral QAC breakfast Gonzella Lex, MD   75 mcg at 07/01/16 0754  . magnesium hydroxide (MILK OF MAGNESIA) suspension 30 mL  30 mL Oral Daily PRN Gonzella Lex, MD      . ondansetron (ZOFRAN) injection 4 mg  4 mg Intravenous Once PRN Molli Barrows, MD      . ondansetron [facility]) injection 4 mg  4 mg Intravenous Once PRN Gunnar Bulla, MD      . pantoprazole (PROTONIX) EC tablet 40 mg  40 mg Oral QAC breakfast Gonzella Lex, MD   40 mg at 07/01/16 0753  . QUEtiapine (SEROQUEL) tablet 100 mg  100 mg Oral QHS Himabindu Ravi, MD   100 mg at 07/01/16 2157    Lab Results:  Results for orders placed or performed during the hospital encounter of 06/20/16 (from the past 48 hour(s))  Glucose, capillary     Status: None   Collection Time: 07/02/16  6:32 AM  Result Value Ref Range   Glucose-Capillary 93 65 - 99 mg/dL    Blood Alcohol level:  Lab Results  Component Value Date   ETH <5 91/63/8466    Metabolic Disorder Labs: Lab Results  Component Value Date   HGBA1C 5.3 06/21/2016   MPG 105 06/21/2016   No results found for: PROLACTIN Lab Results  Component Value Date   CHOL  145 06/21/2016   TRIG 51 06/21/2016   HDL 44 06/21/2016   CHOLHDL 3.3 06/21/2016  VLDL 10 06/21/2016   LDLCALC 91 06/21/2016   LDLCALC 70 08/22/2013    Physical Findings: AIMS:  , ,  ,  ,    CIWA:    COWS:     Musculoskeletal: Strength & Muscle Tone: weak, some generalized atrophy due to patient limiting her mobility Gait & Station: normal Patient leans: N/A  Psychiatric Specialty Exam: Physical Exam  Nursing note and vitals reviewed. Constitutional: She appears well-developed and well-nourished.  HENT:  Head: Normocephalic and atraumatic.  Eyes: Conjunctivae are normal. Pupils are equal, round, and reactive to light.  Neck: Normal range of motion.  Cardiovascular: Regular rhythm and normal heart sounds.   Respiratory: Effort normal. No respiratory distress.  GI: Soft.  Musculoskeletal: Normal range of motion.  Neurological: She is alert.  Skin: Skin is warm and dry.  Psychiatric: Her affect is blunt. Her speech is delayed. She is slowed. She expresses impulsivity. She expresses no suicidal ideation. She exhibits abnormal recent memory.    Review of Systems  Constitutional: Negative.   HENT: Negative.   Eyes: Negative.   Respiratory: Negative.   Cardiovascular: Negative.   Gastrointestinal: Negative.   Musculoskeletal: Negative.   Skin: Negative.   Neurological: Negative.   Psychiatric/Behavioral: Positive for depression. Negative for hallucinations, memory loss, substance abuse and suicidal ideas. The patient is nervous/anxious. The patient does not have insomnia.     Blood pressure (!) 110/49, pulse 80, temperature 99.1 F (37.3 C), resp. rate 20, height 5' 2"  (1.575 m), weight 80.7 kg (178 lb), SpO2 93 %.Body mass index is 32.56 kg/m.  General Appearance: Casual  Eye Contact:  Fair  Speech:  Slow, soft, minimal  Volume:  Decreased  Mood:  Euthymic  Affect:  Congruent  Thought Process:  Coherent  Orientation:  Full (Time, Place, and Person)  Thought  Content:  Rumination  Suicidal Thoughts:  Yes.  without intent/plan  Homicidal Thoughts:  No  Memory:  Immediate;   Fair Recent;   Fair Remote;   Fair  Judgement:  Impaired  Insight:  Lacking  Psychomotor Activity:  Psychomotor Retardation  Concentration:  Concentration: Fair and Attention Span: Fair  Recall:  [employer] of Knowledge:  Fair  Language:  Fair  Akathisia:  No  Handed:  Right  AIMS (if indicated):     Assets:  Communication Skills Housing  ADL's:  Impaired  Cognition:  WNL  Sleep:  Number of Hours: 8.3     Treatment Plan Summary: Daily contact with patient to assess and evaluate symptoms and progress in treatment and Medication management   Major Depressive Disorder Continue increased dose of Lexapro and current seroquel. Consult to ECT since patient not responding to multiple medications and decompensating.  Anxiety- Continue Buspar  Malnutrition- Nutrition consult done, recommendations being followed with ensure supplements.  Hypothyroidism- synthroid  Continue Aricept per primary care.  GERD- Pantoprazole  Labs-TSH wnl, lipid panel wnl, obtain UA  Disposition- After ECT treatment to outpatient Psychiatrist. Patient may need assisted living or nursing home based on response to ECT.  Continue ECT 3 times a week into next week. So far appears to be tolerating it well and we are hoping to start seeing some clear improvement. No change to medication. Explained to patient the rationale for continued hospitalization and encouraged her to be as active as possible and to eat well.   3/10 patient reports doing well today. She did not have major issues or complaints. No medication changes will be made. Vital signs in nursing  notes were reviewed.  Stable and improving  3/11 patient is doing well today. No changes will be made in her medication regimen. She seems to be improving. Patient feels better and says she is looking forward to be discharged soon next week.  I encouraged her to discuss this with Dr. Lissa Hoard packs tomorrow. Patient has been compliant with her medications and has maintained a fair to good oral intake  Made it pretty clear to the patient today that I thought that she really needed more treatment in order to get well and that I was going to probably insist on keeping her here until the end of the week. Next ECT treatment tomorrow morning. Meanwhile no change in medication for now.  Sr. insisting that nursing told her last night that the patient was going to be discharged today. This is hard to believe given that my note above explicitly states that the patient would be here until the end of the week. B state once again that the patient will be here until the end of the week. No change to medicine right now. Next ECT on Friday. Alethia Berthold, MD 07/02/2016, 6:36 PM

## 2016-07-02 NOTE — Anesthesia Procedure Notes (Signed)
Date/Time: 07/02/2016 10:45 AM Performed by: Dionne Bucy Pre-anesthesia Checklist: Patient identified, Emergency Drugs available, Suction available and Patient being monitored Patient Re-evaluated:Patient Re-evaluated prior to inductionOxygen Delivery Method: Circle system utilized Preoxygenation: Pre-oxygenation with 100% oxygen Intubation Type: IV induction Ventilation: Mask ventilation without difficulty and Mask ventilation throughout procedure Airway Equipment and Method: Bite block Placement Confirmation: positive ETCO2 Dental Injury: Teeth and Oropharynx as per pre-operative assessment

## 2016-07-02 NOTE — [facility] Group Notes (Signed)
[facility] Group Notes:  (Nursing/MHT/Case Management/Adjunct)  Date:  07/02/2016  Time:  12:25 AM  Type of Therapy:  Psychoeducational Skills  Participation Level:  Did Not Attend Summary of Progress/Problems:  Cassidy [name] 07/02/2016, 12:25 AM

## 2016-07-02 NOTE — Anesthesia Post-op Follow-up Note (Cosign Needed)
Anesthesia QCDR form completed.        

## 2016-07-02 NOTE — Progress Notes (Signed)
D: Patient focus on discharge . Limited  Interaction with peers on unit . Appropriate ADL'S and personal chores . Alert X4 Patient escorted to and from ECT with no problems  No unit participation with unit programing   Patient stated slept good last night .Stated appetite fair and energy level  Is normal. . Stated no Depression  Denies suicidal  homicidal ideations  .  No auditory hallucinations  No pain concerns . Appropriate ADL'S. Interacting with peers and staff.  A: Encourage patient participation with unit programming . Instruction  Given on  Medication , verbalize understanding. R: Voice no other concerns. Staff continue to monitor

## 2016-07-02 NOTE — Progress Notes (Signed)
Recreation Therapy Notes  Date: 03.14.18 Time: 9:30 am Location: Craft Room  Group Topic: Self-esteem  Goal Area(s) Addresses:  Patient will write at least one positive trait about self. Patient will verbalize benefit of having healthy self-esteem.  Behavioral Response: Did not attend  Intervention: I Am  Activity: Patients were given a worksheet with the letter I on it and were instructed to write as many positive traits about themselves inside the letter.  Education: LRT educated patients on ways they can increase their self-esteem.  Education Outcome: Patient did not attend group.  Clinical Observations/Feedback: Patient did not attend group.  Leonette Monarch, LRT/CTRS 07/02/2016 10:06 AM

## 2016-07-02 NOTE — Plan of Care (Signed)
Problem: Safety: Goal: Periods of time without injury will increase Outcome: Progressing Pt safe on the unit at this time   

## 2016-07-02 NOTE — Transfer of Care (Signed)
Immediate Anesthesia Transfer of Care Note  Patient: Cassidy [name]  Procedure(s) Performed: ECT  Patient Location: PACU  Anesthesia Type:General  Level of Consciousness: sedated  Airway & Oxygen Therapy: Patient Spontanous Breathing and Patient connected to face mask oxygen  Post-op Assessment: Report given to RN and Post -op Vital signs reviewed and stable  Post vital signs: Reviewed and stable  Last Vitals:  Vitals:   07/02/16 0855 07/02/16 1100  BP: (!) 132/59 122/80  Pulse: 88 82  Resp: 18 17  Temp: 36.6 C 37.2 C    Last Pain:  Vitals:   07/02/16 0855  TempSrc: Oral  PainSc:          Complications: No apparent anesthesia complications

## 2016-07-02 NOTE — Procedures (Signed)
ECT SERVICES Physician's Interval Evaluation & Treatment Note  Patient Identification: TOSHA BELGARDE MRN:  [id number] Date of Evaluation:  07/02/2016 TX #: 4  MADRS: 16  MMSE: 28  P.E. Findings:  Patient continues to be poorly groomed and disheveled hasn't washed. No other physical exam  Psychiatric Interval Note:  Mood is reported as being better although patient remains withdrawn and with little activity  Subjective:  Patient is a 76 y.o. female seen for evaluation for Electroconvulsive Therapy. No specific complaint  Treatment Summary:   [x]   Right Unilateral             []  Bilateral   % Energy : 0.3 ms 80%   Impedance: 2010 ohms  Seizure Energy Index: 9933 V squared  Postictal Suppression Index: 80%  Seizure Concordance Index: 48%  Medications  Pre Shock: Labetalol 10 mg Brevital 80 mg succinylcholine 80 mg  Post Shock:    Seizure Duration: 14 seconds by EMG 45 seconds by EEG   Comments: Follow-up Friday   Lungs:  [x]   Clear to auscultation               []  Other:   Heart:    [x]   Regular rhythm             []  irregular rhythm    [x]   Previous H&P reviewed, patient examined and there are NO CHANGES                 []   Previous H&P reviewed, patient examined and there are changes noted.   Alethia Berthold, MD 3/14/201810:38 AM

## 2016-07-02 NOTE — Anesthesia Preprocedure Evaluation (Signed)
Anesthesia Evaluation  Patient identified by MRN, date of birth, ID band Patient awake    Reviewed: Allergy & Precautions, NPO status , Patient's Chart, lab work & pertinent test results  Airway Mallampati: II       Dental  (+) Upper Dentures, Lower Dentures   Pulmonary neg pulmonary ROS,     + decreased breath sounds      Cardiovascular Exercise Tolerance: Good  Rhythm:Regular     Neuro/Psych Anxiety Depression    GI/Hepatic GERD  Medicated,(+) Hepatitis -, Unspecified  Endo/Other  Hypothyroidism Morbid obesity  Renal/GU negative Renal ROS     Musculoskeletal  (+) Arthritis ,   Abdominal (+) + obese,   Peds negative pediatric ROS (+)  Hematology  (+) anemia ,   Anesthesia Other Findings   Reproductive/Obstetrics                             Anesthesia Physical Anesthesia Plan  ASA: III  Anesthesia Plan: General   Post-op Pain Management:    Induction: Intravenous  Airway Management Planned: Mask and Natural Airway  Additional Equipment:   Intra-op Plan:   Post-operative Plan:   Informed Consent: I have reviewed the patients History and Physical, chart, labs and discussed the procedure including the risks, benefits and alternatives for the proposed anesthesia with the patient or authorized representative who has indicated his/her understanding and acceptance.     Plan Discussed with: CRNA  Anesthesia Plan Comments:         Anesthesia Quick Evaluation

## 2016-07-02 NOTE — Progress Notes (Signed)
D: Pt said she was ok. Pt sister wants to be notified when pt being D/C so she can pick her up A: Pt was offered support and encouragement. Pt was given scheduled medications. Pt was encourage to attend groups. Q 15 minute checks were done for safety.   R: safety maintained on unit.

## 2016-07-02 NOTE — Anesthesia Postprocedure Evaluation (Signed)
Anesthesia Post Note  Patient: Cassidy [name]  Procedure(s) Performed: * No procedures listed *  Patient location during evaluation: PACU Anesthesia Type: General Level of consciousness: awake Pain management: pain level controlled Vital Signs Assessment: post-procedure vital signs reviewed and stable Respiratory status: spontaneous breathing Cardiovascular status: stable Anesthetic complications: no     Last Vitals:  Vitals:   07/02/16 1130 07/02/16 1140  BP: 106/64 (!) 110/49  Pulse: 81 80  Resp: (!) 21 20  Temp:  37.3 C    Last Pain:  Vitals:   07/02/16 0855  TempSrc: Oral  PainSc:                  VAN STAVEREN,Kham Zuckerman

## 2016-07-03 NOTE — [facility] Group Notes (Signed)
[facility] LCSW Group Therapy Note  Date/Time: 07/03/16, 1300  Type of Therapy/Topic:  Group Therapy:  Balance in Life  Participation Level:  minimal Description of Group:    This group will address the concept of balance and how it feels and looks when one is unbalanced. Patients will be encouraged to process areas in their lives that are out of balance, and identify reasons for remaining unbalanced. Facilitators will guide patients utilizing problem- solving interventions to address and correct the stressor making their life unbalanced. Understanding and applying boundaries will be explored and addressed for obtaining  and maintaining a balanced life. Patients will be encouraged to explore ways to assertively make their unbalanced needs known to significant others in their lives, using other group members and facilitator for support and feedback.  Therapeutic Goals: 1. Patient will identify two or more emotions or situations they have that consume much of in their lives. 2. Patient will identify signs/triggers that life has become out of balance:  3. Patient will identify two ways to set boundaries in order to achieve balance in their lives:  4. Patient will demonstrate ability to communicate their needs through discussion and/or role plays  Summary of Patient Progress: Pt remained in group for about 5 minutes and then left.  She did respond to several items presented by CSW.          Therapeutic Modalities:   Cognitive Behavioral Therapy Solution-Focused Therapy Assertiveness Training  Lurline Idol, [place]

## 2016-07-03 NOTE — Progress Notes (Signed)
[facility] MD Progress Note  07/03/2016 7:28 PM GRAYCEN DEGAN  MRN:  [id number] Subjective:Patient is a 76 yo woman with severe depression , decompensating over past few months with poor nutrition and not responsive to multi ple medication changes, seen by outpatient psychiatrist at [facility] Admitted for stabilization .   Patient reports being unhappy being in the hospital. Per her sister she has not showered in months.She was initially resistant but showered with help of tech. She has not been eating, consumption less than 10 % of her meals. She is compliant with medications. Minimal participation in groups. Continues to say that her grandson is the only one keeping her from dying.  Follow-up for Friday the ninth. Patient had third ECT treatment today which once again went well and appeared to be well tolerated. Patient continues to irritably focused on discharge plans. Social work spoke with her grandson today who is strongly supportive of her continued hospitalization. Family will be coming in over the weekend. Patient still spends too much time in bed for my liking although she says that she is feeling better.  3/10 she says she is unhappy because she is still in the hospital. She says she wants to go home. She grades her mood as an 8 out of 10 x 10 being the best. Says that she has been eating and has been attending groups. She denies suicidality or homicidality. She also denies having any hallucinations. She denies having any side effects from medications. She denies any physical complaints. He states that ECT has been helping her. She was encouraged to discuss discharge with her primary psychiatrist on Monday  Per staff she is isolating herself in the room  3/11 patient was more engaged in the assessment today. She says she is doing very well denies having any issues or concerns. Denies side effects to medications or having any physical complaints. Says that her mood is improved, denies suicidality,  homicidality or auditory or visual hallucinations. Says that she has been in many sleeping very well.  Follow-up for Thursday the 15th. Patient seen again this afternoon. As usual she was lying in bed immobile staring at the wall. Clearly has not washed her bathed or taking care of herself. She is completely focused as usual on being discharged from the hospital. Insight is poor. I had made an agreement with the patient and with her sister that the patient will be discharged from the hospital tomorrow afternoon after ECT treatment. Tried to impress upon the patient to really focus on her own improvement in the time being.  Per nursing: Pt denies SI/HI/AVH. Denies pain. Stated she is hopeful to go home on Monday. Pt mostly isolated to room this shift. States it is too loud in dayroom. Medication compliant. Voices no other concerns at this time. Encouragement and support provided. Safety maintained. Will continue to monitor.  D: Patient stated slept good last night .Stated appetite fair and energy level  Is normal. Stated concentration is good .Denies  Depression , hopeless and anxiety . Denies suicidal  homicidal ideations  .  No auditory hallucinations  No pain concerns . Appropriate ADL'S. Interacting with peers and staff. Patient voice of wanting to go home . Stated she had talked with DR. Breyon Blass  Early in the week and projected date Monday after ECT . Limited interaction  With her peers . Patient stayed closed  To her room this shift     A.Encourage patient participation with unit programming . Instruction  Given on  Medication , verbalize understanding. R: Voice no other concerns. Staff continue to monitor   Principal Problem: Severe recurrent major depression without psychotic features [facility]) Diagnosis:   Patient Active Problem List   Diagnosis Date Noted  . Severe recurrent major depression without psychotic features ([place]) [F33.2] 06/19/2016  . Hepatic encephalopathy ([place]) [K72.90] 08/13/2015   . Thrombocytopenia ([place]) [D69.6] 07/23/2015  . Amnesia [R41.3] 03/20/2015  . Major depression in remission ([place]) [F32.5] 03/12/2015  . Gonalgia [M25.569] 01/05/2015  . Absolute anemia [D64.9] 11/27/2014  . Arthritis [M19.90] 11/27/2014  . Acid reflux [K21.9] 11/27/2014  . Depression, major, recurrent, in partial remission ([place]) [F33.41] 11/27/2014  . Encounter for general adult medical examination without abnormal findings [Z00.00] 08/25/2014  . Chronic LBP [M54.5, G89.29] 08/15/2014  . 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 Infraspinatus tenosynovitis [M65.819] 05/01/2014  . Other synovitis and tenosynovitis, right shoulder [M65.811] 05/01/2014  . Difficulty in walking [R26.2] 11/30/2013  . Extreme obesity ([place]) [E66.01] 11/30/2013  . Morbid obesity ([place]) [E66.01] 11/30/2013  . Arthritis, degenerative [M19.90] 10/01/2013  . Steatohepatitis [K75.81] 10/01/2013  . Orthostasis [I95.1] 10/01/2013  . Appendicular ataxia [R27.0] 09/26/2013  . Fall [W19.XXXA] 09/26/2013  . Dizziness [R42] 09/07/2013  . Osteoporosis with fracture [M80.00XA] 09/06/2013  . Clinical depression [F32.9] 03/25/2012  . Adult hypothyroidism [E03.9] 03/25/2012  . Esophageal stenosis [K22.2] 07/03/2009  . Back ache [M54.9] 03/08/2003  . Anxiety state [F41.1] 12/27/2002   Total Time spent with patient: 20 minutes  Past Psychiatric History: Patient has had prior psychiatric admissions here at our hospital before. Denies suicide attempts. Patient has been on multiple antidepressants without consistent benefit. She is currently on Lexapro and BuSpar and Seroquel  Past Medical History:  Past Medical History:  Diagnosis Date  . Anxiety   . Depression   . Fatty liver   . GERD (gastroesophageal reflux disease)   . Hypothyroidism   . Obesity   . Thyroid disease     Past Surgical History:  Procedure Laterality Date  . APPENDECTOMY    . ESOPHAGOGASTRODUODENOSCOPY (EGD) WITH  PROPOFOL N/A 12/20/2014   Procedure: ESOPHAGOGASTRODUODENOSCOPY (EGD) WITH PROPOFOL;  Surgeon: Manya Silvas, MD;  Location[facility] ENDOSCOPY;  Service: Endoscopy;  Laterality: N/A;  . SAVORY DILATION N/A 12/20/2014   Procedure: SAVORY DILATION;  Surgeon: Manya Silvas, MD;  Location: [facility] ENDOSCOPY;  Service: Endoscopy;  Laterality: N/A;  . TONSILLECTOMY     Family History:  Family History  Problem Relation Age of Onset  . Diabetes Mother   . COPD Mother   . Kidney disease Mother   . Depression Mother   . Heart attack Father   . Aneurysm Father   . Anxiety disorder Sister   . Depression Sister   . Diabetes Sister   . Hypertension Sister   . Atrial fibrillation Brother   . Spinal muscular atrophy Brother   . Breast cancer Sister   . Bone cancer Sister   . Depression Sister   . Anxiety disorder Sister   . Breast cancer Sister   . Colon cancer Sister   . Depression Sister   . Diabetes Sister   . COPD Sister   . Depression Sister   . Anxiety disorder Sister   . Diabetes Brother   . Depression Brother    Family Psychiatric  History: see above Social History:  History  Alcohol Use No     History  Drug Use No    Social History   Social History  .  Marital status: Widowed    Spouse name: N/A  . Number of children: N/A  . Years of education: N/A   Social History Main Topics  . Smoking status: Never Smoker  . Smokeless tobacco: Never Used  . Alcohol use No  . Drug use: No  . Sexual activity: Not Currently    Birth control/ protection: Post-menopausal   Other Topics Concern  . None   Social History Narrative  . None   Additional Social History:                      Sleep: Good  Appetite:  Poor  Current Medications: Current Facility-Administered Medications  Medication Dose Route Frequency Provider Last Rate Last Dose  . acetaminophen (TYLENOL) tablet 650 mg  650 mg Oral Q6H PRN Gonzella Lex, MD      . alum & mag hydroxide-simeth  (MAALOX/MYLANTA) 200-200-20 MG/5ML suspension 30 mL  30 mL Oral Q4H PRN Gonzella Lex, MD      . busPIRone (BUSPAR) tablet 15 mg  15 mg Oral BID Gonzella Lex, MD   15 mg at 07/03/16 1741  . celecoxib (CELEBREX) capsule 100 mg  100 mg Oral Daily Gonzella Lex, MD   100 mg at 07/03/16 0755  . donepezil (ARICEPT) tablet 5 mg  5 mg Oral QHS Gonzella Lex, MD   5 mg at 07/02/16 2142  . escitalopram (LEXAPRO) tablet 10 mg  10 mg Oral QHS Gonzella Lex, MD   10 mg at 07/02/16 2142  . feeding supplement (ENSURE ENLIVE) (ENSURE ENLIVE) liquid 237 mL  237 mL Oral BID BM Himabindu Ravi, MD   237 mL at 07/03/16 1000  . fentaNYL (SUBLIMAZE) injection 25 mcg  25 mcg Intravenous Q5 min PRN Molli Barrows, MD      . fluticasone [facility]) 50 MCG/ACT nasal spray 2 spray  2 spray Each Nare Daily Gonzella Lex, MD   2 spray at 07/03/16 0755  . levothyroxine (SYNTHROID, LEVOTHROID) tablet 75 mcg  75 mcg Oral QAC breakfast Gonzella Lex, MD   75 mcg at 07/03/16 0755  . magnesium hydroxide (MILK OF MAGNESIA) suspension 30 mL  30 mL Oral Daily PRN Gonzella Lex, MD      . ondansetron (ZOFRAN) injection 4 mg  4 mg Intravenous Once PRN Molli Barrows, MD      . ondansetron [facility]) injection 4 mg  4 mg Intravenous Once PRN Gunnar Bulla, MD      . pantoprazole (PROTONIX) EC tablet 40 mg  40 mg Oral QAC breakfast Gonzella Lex, MD   40 mg at 07/03/16 0755  . QUEtiapine (SEROQUEL) tablet 100 mg  100 mg Oral QHS Himabindu Ravi, MD   100 mg at 07/02/16 2142    Lab Results:  Results for orders placed or performed during the hospital encounter of 06/20/16 (from the past 48 hour(s))  Glucose, capillary     Status: None   Collection Time: 07/02/16  6:32 AM  Result Value Ref Range   Glucose-Capillary 93 65 - 99 mg/dL    Blood Alcohol level:  Lab Results  Component Value Date   ETH <5 13/24/4010    Metabolic Disorder Labs: Lab Results  Component Value Date   HGBA1C 5.3 06/21/2016   MPG 105 06/21/2016   No  results found for: PROLACTIN Lab Results  Component Value Date   CHOL 145 06/21/2016   TRIG 51 06/21/2016   HDL 44  06/21/2016   CHOLHDL 3.3 06/21/2016   VLDL 10 06/21/2016   LDLCALC 91 06/21/2016   LDLCALC 70 08/22/2013    Physical Findings: AIMS:  , ,  ,  ,    CIWA:    COWS:     Musculoskeletal: Strength & Muscle Tone: weak, some generalized atrophy due to patient limiting her mobility Gait & Station: normal Patient leans: N/A  Psychiatric Specialty Exam: Physical Exam  Nursing note and vitals reviewed. Constitutional: She appears well-developed and well-nourished.  HENT:  Head: Normocephalic and atraumatic.  Eyes: Conjunctivae are normal. Pupils are equal, round, and reactive to light.  Neck: Normal range of motion.  Cardiovascular: Regular rhythm and normal heart sounds.   Respiratory: Effort normal. No respiratory distress.  GI: Soft.  Musculoskeletal: Normal range of motion.  Neurological: She is alert.  Skin: Skin is warm and dry.  Psychiatric: Her affect is blunt. Her speech is delayed. She is slowed. She expresses impulsivity. She expresses no suicidal ideation. She exhibits abnormal recent memory.    Review of Systems  Constitutional: Negative.   HENT: Negative.   Eyes: Negative.   Respiratory: Negative.   Cardiovascular: Negative.   Gastrointestinal: Negative.   Musculoskeletal: Negative.   Skin: Negative.   Neurological: Negative.   Psychiatric/Behavioral: Positive for depression. Negative for hallucinations, memory loss, substance abuse and suicidal ideas. The patient is nervous/anxious. The patient does not have insomnia.     Blood pressure 128/76, pulse 86, temperature 98.3 F (36.8 C), resp. rate 18, height 5' 2"  (1.575 m), weight 80.7 kg (178 lb), SpO2 93 %.Body mass index is 32.56 kg/m.  General Appearance: Casual  Eye Contact:  Fair  Speech:  Slow, soft, minimal  Volume:  Decreased  Mood:  Euthymic  Affect:  Congruent  Thought Process:   Coherent  Orientation:  Full (Time, Place, and Person)  Thought Content:  Rumination  Suicidal Thoughts:  Yes.  without intent/plan  Homicidal Thoughts:  No  Memory:  Immediate;   Fair Recent;   Fair Remote;   Fair  Judgement:  Impaired  Insight:  Lacking  Psychomotor Activity:  Psychomotor Retardation  Concentration:  Concentration: Fair and Attention Span: Fair  Recall:  [employer] of Knowledge:  Fair  Language:  Fair  Akathisia:  No  Handed:  Right  AIMS (if indicated):     Assets:  Communication Skills Housing  ADL's:  Impaired  Cognition:  WNL  Sleep:  Number of Hours: 7.15     Treatment Plan Summary: Daily contact with patient to assess and evaluate symptoms and progress in treatment and Medication management   Major Depressive Disorder Continue increased dose of Lexapro and current seroquel. Consult to ECT since patient not responding to multiple medications and decompensating.  Anxiety- Continue Buspar  Malnutrition- Nutrition consult done, recommendations being followed with ensure supplements.  Hypothyroidism- synthroid  Continue Aricept per primary care.  GERD- Pantoprazole  Labs-TSH wnl, lipid panel wnl, obtain UA  Disposition- After ECT treatment to outpatient Psychiatrist. Patient may need assisted living or nursing home based on response to ECT.  Continue ECT 3 times a week into next week. So far appears to be tolerating it well and we are hoping to start seeing some clear improvement. No change to medication. Explained to patient the rationale for continued hospitalization and encouraged her to be as active as possible and to eat well.   3/10 patient reports doing well today. She did not have major issues or complaints. No medication changes  will be made. Vital signs in nursing notes were reviewed.  Stable and improving  3/11 patient is doing well today. No changes will be made in her medication regimen. She seems to be improving. Patient feels  better and says she is looking forward to be discharged soon next week. I encouraged her to discuss this with Dr. Lissa Hoard packs tomorrow. Patient has been compliant with her medications and has maintained a fair to good oral intake  Made it pretty clear to the patient today that I thought that she really needed more treatment in order to get well and that I was going to probably insist on keeping her here until the end of the week. Next ECT treatment tomorrow morning. Meanwhile no change in medication for now.  ECT tomorrow after which I anticipate discharge home. We will try to continue ECT into next week although I will be surprised if the patient cooperates. No change to medication today. Situation with nursing and social work and the patient. Alethia Berthold, MD 07/03/2016, 7:28 PM

## 2016-07-03 NOTE — Plan of Care (Signed)
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

## 2016-07-03 NOTE — Progress Notes (Signed)
D: Pt denies SI/HI/AVH. Pt is pleasant and cooperative. Pt stated he feels better from doing ECT, she appears less anxious and he is interacting with peers and staff appropriately.  A: Pt was offered support and encouragement. Pt was given scheduled medications. Pt was encouraged to attend groups. Q 15 minute checks were done for safety.  R:Pt attends groups and interacts well with peers and staff. Pt is taking medication. Pt has no complaints.Pt receptive to treatment and safety maintained on unit.

## 2016-07-03 NOTE — Progress Notes (Signed)
Recreation Therapy Notes  Date: 03.15.18 Time: 9:30 am Location: Craft Room  Group Topic: Leisure Education  Goal Area(s) Addresses:  Patient will identify activities for each letter of the alphabet. Patient will verbalize ability to integrate positive leisure into life post d/c. Patient will verbalize ability to use leisure as a Technical sales engineer.  Behavioral Response: Attentive, Interactive, Left early  Intervention: Leisure Alphabet  Activity: Patients were given a Leisure Air traffic controller and were instructed to write healthy leisure activities for each letter of the alphabet.  Education: LRT educated patients on what they need to participate in leisure.  Education Outcome: In group clarification offered   Clinical Observations/Feedback: Patient wrote healthy leisure activities. Patient verbalized healthy leisure activities. Patient left group at approximately 9:50 am and did not return to group.  Leonette Monarch, LRT/CTRS 07/03/2016 10:06 AM

## 2016-07-03 NOTE — Tx Team (Signed)
Interdisciplinary Treatment and Diagnostic Plan Update  07/03/2016 Time of Session: [place] MRN: [id number]  Principal Diagnosis: Severe recurrent major depression without psychotic features [facility])  Secondary Diagnoses: Principal Problem:   Severe recurrent major depression without psychotic features ([place])   Current Medications:  Current Facility-Administered Medications  Medication Dose Route Frequency Provider Last Rate Last Dose  . acetaminophen (TYLENOL) tablet 650 mg  650 mg Oral Q6H PRN Gonzella Lex, MD      . alum & mag hydroxide-simeth (MAALOX/MYLANTA) 200-200-20 MG/5ML suspension 30 mL  30 mL Oral Q4H PRN Gonzella Lex, MD      . busPIRone (BUSPAR) tablet 15 mg  15 mg Oral BID Gonzella Lex, MD   15 mg at 07/03/16 0755  . celecoxib (CELEBREX) capsule 100 mg  100 mg Oral Daily Gonzella Lex, MD   100 mg at 07/03/16 0755  . donepezil (ARICEPT) tablet 5 mg  5 mg Oral QHS Gonzella Lex, MD   5 mg at 07/02/16 2142  . escitalopram (LEXAPRO) tablet 10 mg  10 mg Oral QHS Gonzella Lex, MD   10 mg at 07/02/16 2142  . feeding supplement (ENSURE ENLIVE) (ENSURE ENLIVE) liquid 237 mL  237 mL Oral BID BM Himabindu Ravi, MD   237 mL at 07/03/16 1000  . fentaNYL (SUBLIMAZE) injection 25 mcg  25 mcg Intravenous Q5 min PRN Molli Barrows, MD      . fluticasone [facility]) 50 MCG/ACT nasal spray 2 spray  2 spray Each Nare Daily Gonzella Lex, MD   2 spray at 07/03/16 0755  . levothyroxine (SYNTHROID, LEVOTHROID) tablet 75 mcg  75 mcg Oral QAC breakfast Gonzella Lex, MD   75 mcg at 07/03/16 0755  . magnesium hydroxide (MILK OF MAGNESIA) suspension 30 mL  30 mL Oral Daily PRN Gonzella Lex, MD      . ondansetron (ZOFRAN) injection 4 mg  4 mg Intravenous Once PRN Molli Barrows, MD      . ondansetron [facility]) injection 4 mg  4 mg Intravenous Once PRN Gunnar Bulla, MD      . pantoprazole (PROTONIX) EC tablet 40 mg  40 mg Oral QAC breakfast Gonzella Lex, MD   40 mg at 07/03/16 0755   . QUEtiapine (SEROQUEL) tablet 100 mg  100 mg Oral QHS Himabindu Ravi, MD   100 mg at 07/02/16 2142   PTA Medications: Prescriptions Prior to Admission  Medication Sig Dispense Refill Last Dose  . busPIRone (BUSPAR) 15 MG tablet Take 1 tablet (15 mg total) by mouth 2 (two) times daily. 180 tablet 0 06/20/2016 at Unknown time  . celecoxib (CELEBREX) 100 MG capsule TAKE ONE CAPSULE BY MOUTH TWICE A DAY   06/20/2016 at Unknown time  . donepezil (ARICEPT) 5 MG tablet Take 5 mg by mouth at bedtime.    06/20/2016 at Unknown time  . escitalopram (LEXAPRO) 5 MG tablet Take 1 tablet (5 mg total) by mouth at bedtime. (Patient taking differently: Take 10 mg by mouth at bedtime. ) 30 tablet 3 06/20/2016 at Unknown time  . fluticasone (FLONASE) 50 MCG/ACT nasal spray Place 1 spray into both nostrils daily.   06/20/2016 at Unknown time  . hydrOXYzine (VISTARIL) 25 MG capsule Take 1 capsule (25 mg total) by mouth 3 (three) times daily as needed. 30 capsule 0 06/20/2016 at Unknown time  . KLOR-CON 10 10 MEQ tablet Take 10 mEq by mouth daily.   06/20/2016 at Unknown time  .  levothyroxine (SYNTHROID, LEVOTHROID) 75 MCG tablet Take 75 mcg by mouth daily before breakfast.   06/25/2016 at Unknown time  . omeprazole (PRILOSEC) 20 MG capsule Take 20 mg by mouth 2 (two) times daily.   06/27/2016 at Unknown time  . QUEtiapine (SEROQUEL) 100 MG tablet Take 1 tablet (100 mg total) by mouth at bedtime. (Patient taking differently: Take 400 mg by mouth at bedtime. ) 30 tablet 3 06/20/2016 at Unknown time  . traZODone (DESYREL) 100 MG tablet Take 1 tablet (100 mg total) by mouth at bedtime. 30 tablet 3 Past Week at Unknown time    Patient Stressors: Health problems Loss of daughter Medication change or noncompliance  Patient Strengths: Technical sales engineer for treatment/growth Supportive family/friends  Treatment Modalities: Medication Management, Group therapy, Case management,  1 to 1 session with clinician,  Psychoeducation, Recreational therapy.   Physician Treatment Plan for Primary Diagnosis: Severe recurrent major depression without psychotic features ([place]) Long Term Goal(s): Improvement in symptoms so as ready for discharge Improvement in symptoms so as ready for discharge   Short Term Goals: Ability to identify changes in lifestyle to reduce recurrence of condition will improve Ability to verbalize feelings will improve Ability to disclose and discuss suicidal ideas Ability to demonstrate self-control will improve Ability to identify and develop effective coping behaviors will improve Ability to maintain clinical measurements within normal limits will improve Compliance with prescribed medications will improve Ability to identify changes in lifestyle to reduce recurrence of condition will improve Ability to verbalize feelings will improve Ability to disclose and discuss suicidal ideas Ability to demonstrate self-control will improve Ability to identify and develop effective coping behaviors will improve Ability to maintain clinical measurements within normal limits will improve Compliance with prescribed medications will improve  Medication Management: Evaluate patient's response, side effects, and tolerance of medication regimen.  Therapeutic Interventions: 1 to 1 sessions, Unit Group sessions and Medication administration.  Evaluation of Outcomes: Adequate for Discharge  Physician Treatment Plan for Secondary Diagnosis: Principal Problem:   Severe recurrent major depression without psychotic features ([place])  Long Term Goal(s): Improvement in symptoms so as ready for discharge Improvement in symptoms so as ready for discharge   Short Term Goals: Ability to identify changes in lifestyle to reduce recurrence of condition will improve Ability to verbalize feelings will improve Ability to disclose and discuss suicidal ideas Ability to demonstrate self-control will improve Ability  to identify and develop effective coping behaviors will improve Ability to maintain clinical measurements within normal limits will improve Compliance with prescribed medications will improve Ability to identify changes in lifestyle to reduce recurrence of condition will improve Ability to verbalize feelings will improve Ability to disclose and discuss suicidal ideas Ability to demonstrate self-control will improve Ability to identify and develop effective coping behaviors will improve Ability to maintain clinical measurements within normal limits will improve Compliance with prescribed medications will improve     Medication Management: Evaluate patient's response, side effects, and tolerance of medication regimen.  Therapeutic Interventions: 1 to 1 sessions, Unit Group sessions and Medication administration.  Evaluation of Outcomes: Adequate for Discharge   RN Treatment Plan for Primary Diagnosis: Severe recurrent major depression without psychotic features ([place]) Long Term Goal(s): Knowledge of disease and therapeutic regimen to maintain health will improve  Short Term Goals: Ability to verbalize feelings will improve, Ability to identify and develop effective coping behaviors will improve and Compliance with prescribed medications will improve  Medication Management: RN will administer medications as ordered  by provider, will assess and evaluate patient's response and provide education to patient for prescribed medication. RN will report any adverse and/or side effects to prescribing provider.  Therapeutic Interventions: 1 on 1 counseling sessions, Psychoeducation, Medication administration, Evaluate responses to treatment, Monitor vital signs and CBGs as ordered, Perform/monitor CIWA, COWS, AIMS and Fall Risk screenings as ordered, Perform wound care treatments as ordered.  Evaluation of Outcomes: Adequate for Discharge   LCSW Treatment Plan for Primary Diagnosis: Severe recurrent  major depression without psychotic features ([place]) Long Term Goal(s): Safe transition to appropriate next level of care at discharge, Engage patient in therapeutic group addressing interpersonal concerns.  Short Term Goals: Engage patient in aftercare planning with referrals and resources, Increase social support and Increase skills for wellness and recovery  Therapeutic Interventions: Assess for all discharge needs, 1 to 1 time with Social worker, Explore available resources and support systems, Assess for adequacy in community support network, Educate family and significant other(s) on suicide prevention, Complete Psychosocial Assessment, Interpersonal group therapy.  Evaluation of Outcomes: Adequate for Discharge    Recreational Therapy Treatment Plan for Primary Diagnosis: Severe recurrent major depression without psychotic features ([place]) Long Term Goal(s): Patient will participate in recreation therapy treatment in at least 2 group sessions without prompting from LRT  Short Term Goals: Increase stress management skills  Treatment Modalities: Group Therapy and Individual Treatment Sessions  Therapeutic Interventions: Psychoeducation  Evaluation of Outcomes: Adequate for Discharge   Progress in Treatment: Attending groups:  yes Participating in groups: yes Taking medication as prescribed: Yes. Toleration medication: Yes. Family/Significant other contact made: Yes, individual(s) contacted:  sister Patient understands diagnosis: Yes. Discussing patient identified problems/goals with staff: Yes. Medical problems stabilized or resolved: Yes. Denies suicidal/homicidal ideation: Yes. Issues/concerns per patient self-inventory: No. Other: none  New problem(s) identified: No, Describe:  none  New Short Term/Long Term Goal(s): Pt continues to verbalize that her only goal is to go home.  Discharge Plan or Barriers: Outpt ECT and follow up at [place].  Reason for  Continuation of Hospitalization: Pt will discharge tomorrow.  Estimated Length of Stay: 3 days  Attendees: Patient: Cassidy [name] 07/03/2016   Physician: Dr Weber Cooks, MD 07/03/2016   Nursing: Elige Radon, RN 07/03/2016   RN Care Manager: 07/03/2016   Social Worker: Lurline Idol, LCSW 07/03/2016   Recreational Therapist: Drue Flirt, LRT/CTRS  07/03/2016   Other:  07/03/2016   Other:    Other: 07/03/2016     Scribe for Treatment Team: Joanne Chars, [place] 07/03/2016 3:27 PM

## 2016-07-03 NOTE — Progress Notes (Signed)
D:Patient  Alert this am shift . Voice of her going  Home tomorrow . Affect cheerful  On approach  Excited  About going home . Appropriate ADL"S  Limited interaction with peers and staaff. Appetite improved Patient stated slept good last night .Stated appetite is good and energy level  Is normal. Stated concentration is good .  Denies suicidal  homicidal ideations  .  No auditory hallucinations  No pain concerns . Appropriate ADL'S. Interacting with peers and staff.   Voice concern around  DR. Clapacs  Wanting her to take a bath. A: Encourage patient participation with unit programming . Instruction  Given on  Medication , verbalize understanding. R: Voice no other concerns. Staff continue to monitor

## 2016-07-03 NOTE — Progress Notes (Signed)
  [facility] Adult Case Management Discharge Plan :  Will you be returning to the same living situation after discharge:  Yes,  with grandson At discharge, do you have transportation home?: Yes,  sister Do you have the ability to pay for your medications: Yes,  medicare  Release of information consent forms completed and in the chart;  Patient's signature needed at discharge.  Patient to Follow up at: Follow-up Information    [facility]. Go on 07/10/2016.   Specialty:  Behavioral Health Why:  Please attend your follow up appointment with Dr Einar Grad on 07/10/16 at 1:30pm.  Please bring a copy of your hospital discharge paperwork. Contact information: [place] [phone]       [facility]. Go on 07/07/2016.   Why:  Please attend your follow up outpatient ECT appointment at [facility] on 07/07/16 at 830am. Contact information: [place] [phone] [address] [place] [phone]          Next level of care provider has access to [place] and Suicide Prevention discussed: Yes,  with sister  Have you used any form of tobacco in the last 30 days? (Cigarettes, Smokeless Tobacco, Cigars, and/or Pipes): No  Has patient been referred to the Quitline?: N/A patient is not a smoker  Patient has been referred for addiction treatment: N/A  Joanne Chars, LCSW 07/03/2016, 3:42 PM

## 2016-07-03 NOTE — Plan of Care (Signed)
Problem: Coping: Goal: Ability to verbalize feelings will improve Outcome: Progressing Patient verbalized feelings to staff.

## 2016-07-03 NOTE — [facility] Group Notes (Signed)
[place] Group Notes:  (Nursing/MHT/Case Management/Adjunct)  Date:  07/03/2016  Time:  9:05 PM  Type of Therapy:  Evening Wrap-up Group  Participation Level:  Did Not Attend  Participation Quality:  N/A  Affect:  N/A  Cognitive:  N/A  Insight:  None  Engagement in Group:  Did Not Attend  Modes of Intervention:  Discussion  Summary of Progress/Problems:  Cassidy [name] 07/03/2016, 9:05 PM

## 2016-07-04 ENCOUNTER — Other Ambulatory Visit: Payer: Self-pay | Admitting: Psychiatry

## 2016-07-04 ENCOUNTER — Encounter: Payer: Self-pay | Admitting: Anesthesiology

## 2016-07-04 ENCOUNTER — Telehealth: Payer: Self-pay | Admitting: *Deleted

## 2016-07-04 ENCOUNTER — Inpatient Hospital Stay: Payer: Medicare Other

## 2016-07-04 ENCOUNTER — Inpatient Hospital Stay: Payer: Medicare Other | Admitting: Anesthesiology

## 2016-07-04 LAB — GLUCOSE, CAPILLARY: Glucose-Capillary: 95 mg/dL (ref 65–99)

## 2016-07-04 MED ORDER — SODIUM CHLORIDE 0.9 % IV SOLN
INTRAVENOUS | Status: DC | PRN
  Administered 2016-07-04: 11:00:00 via INTRAVENOUS

## 2016-07-04 MED ORDER — SUCCINYLCHOLINE CHLORIDE 200 MG/10ML IV SOSY
PREFILLED_SYRINGE | INTRAVENOUS | Status: DC | PRN
  Administered 2016-07-04: 80 mg via INTRAVENOUS

## 2016-07-04 MED ORDER — IPRATROPIUM-ALBUTEROL 0.5-2.5 (3) MG/3ML IN SOLN
3.0000 mL | Freq: Once | RESPIRATORY_TRACT | Status: AC
  Administered 2016-07-04: 3 mL via RESPIRATORY_TRACT

## 2016-07-04 MED ORDER — DONEPEZIL HCL 5 MG PO TABS: 5.0000 mg | ORAL_TABLET | Freq: Every day | ORAL | 0 refills | Status: DC

## 2016-07-04 MED ORDER — METHOHEXITAL SODIUM 100 MG/10ML IV SOSY
PREFILLED_SYRINGE | INTRAVENOUS | Status: DC | PRN
  Administered 2016-07-04: 80 mg via INTRAVENOUS

## 2016-07-04 MED ORDER — AMPICILLIN 500 MG PO CAPS
500.0000 mg | ORAL_CAPSULE | Freq: Three times a day (TID) | ORAL | Status: DC
  Administered 2016-07-04: 500 mg via ORAL
  Filled 2016-07-04 (×3): qty 1

## 2016-07-04 MED ORDER — BUSPIRONE HCL 15 MG PO TABS: 15.0000 mg | ORAL_TABLET | Freq: Two times a day (BID) | ORAL | 0 refills | Status: DC

## 2016-07-04 MED ORDER — AMPICILLIN 500 MG PO CAPS: 500.0000 mg | ORAL_CAPSULE | Freq: Three times a day (TID) | ORAL | 0 refills | Status: DC

## 2016-07-04 MED ORDER — SODIUM CHLORIDE 0.9 % IV SOLN
500.0000 mL | Freq: Once | INTRAVENOUS | Status: AC
Start: 2016-07-04 — End: 2016-07-04
  Administered 2016-07-04: 1000 mL via INTRAVENOUS

## 2016-07-04 MED ORDER — LABETALOL HCL 5 MG/ML IV SOLN
INTRAVENOUS | Status: AC
Start: 2016-07-04 — End: 2016-07-04
  Filled 2016-07-04: qty 4

## 2016-07-04 MED ORDER — ONDANSETRON HCL 4 MG/2ML IJ SOLN
4.0000 mg | Freq: Once | INTRAMUSCULAR | Status: AC
  Administered 2016-07-04: 4 mg via INTRAVENOUS

## 2016-07-04 MED ORDER — LEVOTHYROXINE SODIUM 75 MCG PO TABS: 75.0000 ug | ORAL_TABLET | Freq: Every day | ORAL | 0 refills | Status: DC

## 2016-07-04 MED ORDER — LABETALOL HCL 5 MG/ML IV SOLN
INTRAVENOUS | Status: DC | PRN
  Administered 2016-07-04: 20 mg via INTRAVENOUS

## 2016-07-04 MED ORDER — FLUTICASONE PROPIONATE 50 MCG/ACT NA SUSP: 2.0000 | Freq: Every day | NASAL | 2 refills | Status: DC

## 2016-07-04 MED ORDER — SUCCINYLCHOLINE CHLORIDE 20 MG/ML IJ SOLN
INTRAMUSCULAR | Status: AC
  Filled 2016-07-04: qty 1

## 2016-07-04 NOTE — Anesthesia Procedure Notes (Signed)
Date/Time: 07/04/2016 11:06 AM Performed by: Dionne Bucy Pre-anesthesia Checklist: Patient identified, Emergency Drugs available, Suction available and Patient being monitored Patient Re-evaluated:Patient Re-evaluated prior to inductionOxygen Delivery Method: Circle system utilized Preoxygenation: Pre-oxygenation with 100% oxygen Intubation Type: IV induction Ventilation: Mask ventilation without difficulty and Mask ventilation throughout procedure Airway Equipment and Method: Bite block Placement Confirmation: positive ETCO2 Dental Injury: Teeth and Oropharynx as per pre-operative assessment

## 2016-07-04 NOTE — Procedures (Signed)
ECT SERVICES Physician's Interval Evaluation & Treatment Note  Patient Identification: Cassidy [name] MRN:  [id number] Date of Evaluation:  07/04/2016 TX #: 5  MADRS:   MMSE:   P.E. Findings:  Lungs and heart clear vitals unremarkable no change to physical exam  Psychiatric Interval Note:  Actually seems to be a little more lively the and showing more full range affect today  Subjective:  Patient is a 76 y.o. female seen for evaluation for Electroconvulsive Therapy. Not complaining of depression today  Treatment Summary:   [x]   Right Unilateral             []  Bilateral   % Energy : 0.3 ms 80%   Impedance: 1100 ohms  Seizure Energy Index: 6869 V squared  Postictal Suppression Index: 70%  Seizure Concordance Index: 97%  Medications  Pre Shock: Brevital 80 mg, succinylcholine 80 mg labetalol 20 mg  Post Shock:    Seizure Duration: 26 seconds by EMG 49 seconds by EEG   Comments: Follow-up Monday will be scheduled.   Lungs:  [x]   Clear to auscultation               []  Other:   Heart:    [x]   Regular rhythm             []  irregular rhythm    [x]   Previous H&P reviewed, patient examined and there are NO CHANGES                 []   Previous H&P reviewed, patient examined and there are changes noted.   Alethia Berthold, MD 3/16/201810:59 AM

## 2016-07-04 NOTE — Progress Notes (Signed)
Pt is alert and oriented this evening. Pt mood and affect are appropriate. Pt denies SI/HI and AVH at this time and is pleasant and cooperative with staff. Pt is taking medications as prescribed and is active in the milieu. She has no complaints at this time. 15 minute checks are ongoing for safety.

## 2016-07-04 NOTE — Anesthesia Preprocedure Evaluation (Signed)
Anesthesia Evaluation  Patient identified by MRN, date of birth, ID band Patient awake    Reviewed: Allergy & Precautions, NPO status , Patient's Chart, lab work & pertinent test results, reviewed documented beta blocker date and time   Airway Mallampati: II  TM Distance: >3 FB Neck ROM: limited    Dental  (+) Chipped, Poor Dentition, Missing   Pulmonary           Cardiovascular      Neuro/Psych PSYCHIATRIC DISORDERS Anxiety Depression    GI/Hepatic GERD  Controlled,(+) Hepatitis -  Endo/Other  Hypothyroidism   Renal/GU      Musculoskeletal  (+) Arthritis ,   Abdominal   Peds  Hematology  (+) Sickle cell anemia and anemia ,   Anesthesia Other Findings Past Medical History: No date: Anxiety No date: Depression No date: Fatty liver No date: GERD (gastroesophageal reflux disease) No date: Hypothyroidism No date: Obesity No date: Thyroid disease   Reproductive/Obstetrics                             Anesthesia Physical  Anesthesia Plan  ASA: III  Anesthesia Plan: General   Post-op Pain Management:    Induction: Intravenous  Airway Management Planned: Mask  Additional Equipment:   Intra-op Plan:   Post-operative Plan:   Informed Consent: I have reviewed the patients History and Physical, chart, labs and discussed the procedure including the risks, benefits and alternatives for the proposed anesthesia with the patient or authorized representative who has indicated his/her understanding and acceptance.     Plan Discussed with: CRNA  Anesthesia Plan Comments:         Anesthesia Quick Evaluation

## 2016-07-04 NOTE — H&P (Signed)
Cassidy [name] is an 76 y.o. female.   Chief Complaint: Mood is feeling better. Seems to finally be a little more energetic. She is complaining of a feeling of congestion in her nose today. HPI: Recurrent severe depression admitted to the hospital because of failure to thrive and on response to medicine today as treatment #5 and we will be discharging her today. I have recommended continued  Past Medical History:  Diagnosis Date  . Anxiety   . Depression   . Fatty liver   . GERD (gastroesophageal reflux disease)   . Hypothyroidism   . Obesity   . Thyroid disease     Past Surgical History:  Procedure Laterality Date  . APPENDECTOMY    . ESOPHAGOGASTRODUODENOSCOPY (EGD) WITH PROPOFOL N/A 12/20/2014   Procedure: ESOPHAGOGASTRODUODENOSCOPY (EGD) WITH PROPOFOL;  Surgeon: Manya Silvas, MD;  Location: [facility] ENDOSCOPY;  Service: Endoscopy;  Laterality: N/A;  . SAVORY DILATION N/A 12/20/2014   Procedure: SAVORY DILATION;  Surgeon: Manya Silvas, MD;  Location: [facility] ENDOSCOPY;  Service: Endoscopy;  Laterality: N/A;  . TONSILLECTOMY      Family History  Problem Relation Age of Onset  . Diabetes Mother   . COPD Mother   . Kidney disease Mother   . Depression Mother   . Heart attack Father   . Aneurysm Father   . Anxiety disorder Sister   . Depression Sister   . Diabetes Sister   . Hypertension Sister   . Atrial fibrillation Brother   . Spinal muscular atrophy Brother   . Breast cancer Sister   . Bone cancer Sister   . Depression Sister   . Anxiety disorder Sister   . Breast cancer Sister   . Colon cancer Sister   . Depression Sister   . Diabetes Sister   . COPD Sister   . Depression Sister   . Anxiety disorder Sister   . Diabetes Brother   . Depression Brother    Social History:  reports that she has never smoked. She has never used smokeless tobacco. She reports that she does not drink alcohol or use drugs.  Allergies:  Allergies  Allergen Reactions  . Sulfa  Antibiotics Shortness Of Breath    Medications Prior to Admission  Medication Sig Dispense Refill  . busPIRone (BUSPAR) 15 MG tablet Take 1 tablet (15 mg total) by mouth 2 (two) times daily. 180 tablet 0  . celecoxib (CELEBREX) 100 MG capsule TAKE ONE CAPSULE BY MOUTH TWICE A DAY    . donepezil (ARICEPT) 5 MG tablet Take 5 mg by mouth at bedtime.     Marland Kitchen escitalopram (LEXAPRO) 5 MG tablet Take 1 tablet (5 mg total) by mouth at bedtime. (Patient taking differently: Take 10 mg by mouth at bedtime. ) 30 tablet 3  . fluticasone (FLONASE) 50 MCG/ACT nasal spray Place 1 spray into both nostrils daily.    . hydrOXYzine (VISTARIL) 25 MG capsule Take 1 capsule (25 mg total) by mouth 3 (three) times daily as needed. 30 capsule 0  . KLOR-CON 10 10 MEQ tablet Take 10 mEq by mouth daily.    Marland Kitchen levothyroxine (SYNTHROID, LEVOTHROID) 75 MCG tablet Take 75 mcg by mouth daily before breakfast.    . omeprazole (PRILOSEC) 20 MG capsule Take 20 mg by mouth 2 (two) times daily.    . QUEtiapine (SEROQUEL) 100 MG tablet Take 1 tablet (100 mg total) by mouth at bedtime. (Patient taking differently: Take 400 mg by mouth at bedtime. ) 30 tablet 3  .  traZODone (DESYREL) 100 MG tablet Take 1 tablet (100 mg total) by mouth at bedtime. 30 tablet 3    Results for orders placed or performed during the hospital encounter of 06/20/16 (from the past 48 hour(s))  Glucose, capillary     Status: None   Collection Time: 07/04/16  5:55 AM  Result Value Ref Range   Glucose-Capillary 95 65 - 99 mg/dL   No results found.  Review of Systems  Constitutional: Negative.   HENT: Negative.   Eyes: Negative.   Respiratory: Negative.   Cardiovascular: Negative.   Gastrointestinal: Negative.   Musculoskeletal: Negative.   Skin: Negative.   Neurological: Negative.   Psychiatric/Behavioral: Negative for depression, hallucinations, memory loss, substance abuse and suicidal ideas. The patient is not nervous/anxious and does not have  insomnia.     Blood pressure (!) 154/67, pulse 92, temperature 97.7 F (36.5 C), temperature source Oral, resp. rate 18, height 5' 2"  (1.575 m), weight 80.7 kg (178 lb), SpO2 95 %. Physical Exam  Nursing note and vitals reviewed. Constitutional: She appears well-developed and well-nourished.  HENT:  Head: Normocephalic and atraumatic.  Eyes: Conjunctivae are normal. Pupils are equal, round, and reactive to light.  Neck: Normal range of motion.  Cardiovascular: Regular rhythm and normal heart sounds.   Respiratory: Effort normal. No respiratory distress.  GI: Soft.  Musculoskeletal: Normal range of motion.  Neurological: She is alert.  Skin: Skin is warm and dry.  Psychiatric: She has a normal mood and affect. Her speech is normal and behavior is normal. Judgment and thought content normal. Cognition and memory are normal.     Assessment/Plan Today's treatment #5  we will be discharging her home although I have recommended return for further index course next week  Alethia Berthold, MD 07/04/2016, 10:56 AM

## 2016-07-04 NOTE — Transfer of Care (Signed)
Immediate Anesthesia Transfer of Care Note  Patient: Cassidy [name]  Procedure(s) Performed: ECT  Patient Location: PACU  Anesthesia Type:General  Level of Consciousness: sedated  Airway & Oxygen Therapy: Patient Spontanous Breathing and Patient connected to face mask oxygen  Post-op Assessment: Report given to RN  Post vital signs: Reviewed and stable  Last Vitals:  Vitals:   07/04/16 0911 07/04/16 1118  BP: (!) 154/67 (!) 156/97  Pulse: 92 82  Resp: 18 (!) 32  Temp: 36.5 C 37 C    Last Pain:  Vitals:   07/04/16 0918  TempSrc:   PainSc: 0-No pain         Complications: No apparent anesthesia complications

## 2016-07-04 NOTE — Progress Notes (Signed)
Recreation Therapy Notes  Date: 03.16.18 Time: 9:30 am Location: Craft Room  Group Topic: Coping Skills  Goal Area(s) Addresses:  Patient will participate in healthy coping skill activity. Patient will verbalize benefit of using art as a coping skill.  Behavioral Response: Did not attend  Intervention: Coloring  Activity: Patients were given coloring sheets to color and were instructed to think about the emotions they were feeling and what their minds were focused on.  Education: LRT educated patients on healthy coping skills.  Education Outcome: Patient did not attend group.  Clinical Observations/Feedback: Patient at ECT treatment during group.  Leonette Monarch, LRT/CTRS 07/04/2016 10:17 AM

## 2016-07-04 NOTE — OR Nursing (Signed)
Dr. Weber Cooks notified of SA02 88-90% on room air and c/o bumps on inside nose.  Resting comfortably and is talkative.  Respiratory rate increases when becomes anxious about certain topics.  Dr. Rosey Bath and Clapacs notified of CXR results.  Ok for transfer back to behavioral med per Dr. Weber Cooks.

## 2016-07-04 NOTE — Discharge Instructions (Signed)
Living With Depression Everyone experiences occasional disappointment, sadness, and loss in their lives. When you are feeling down, blue, or sad for at least 2 weeks in a row, it may mean that you have depression. Depression can affect your thoughts and feelings, relationships, daily activities, and physical health. It is caused by changes in the way your brain functions. If you receive a diagnosis of depression, your health care provider will tell you which type of depression you have and what treatment options are available to you. If you are living with depression, there are ways to help you recover from it and also ways to prevent it from coming back. How to cope with lifestyle changes Coping with stress  Stress is your bodys reaction to life changes and events, both good and bad. Stressful situations may include:  Getting married.  The death of a spouse.  Losing a job.  Retiring.  Having a baby. Stress can last just a few hours or it can be ongoing. Stress can play a major role in depression, so it is important to learn both how to cope with stress and how to think about it differently. Talk with your health care provider or a counselor if you would like to learn more about stress reduction. He or she may suggest some stress reduction techniques, such as:  Music therapy. This can include creating music or listening to music. Choose music that you enjoy and that inspires you.  Mindfulness-based meditation. This kind of meditation can be done while sitting or walking. It involves being aware of your normal breaths, rather than trying to control your breathing.  Centering prayer. This is a kind of meditation that involves focusing on a spiritual word or phrase. Choose a word, phrase, or sacred image that is meaningful to you and that brings you peace.  Deep breathing. To do this, expand your stomach and inhale slowly through your nose. Hold your breath for 3-5 seconds, then exhale  slowly, allowing your stomach muscles to relax.  Muscle relaxation. This involves intentionally tensing muscles then relaxing them. Choose a stress reduction technique that fits your lifestyle and personality. Stress reduction techniques take time and practice to develop. Set aside 5-15 minutes a day to do them. Therapists can offer training in these techniques. The training may be covered by some insurance plans. Other things you can do to manage stress include:  Keeping a stress diary. This can help you learn what triggers your stress and ways to control your response.  Understanding what your limits are and saying no to requests or events that lead to a schedule that is too full.  Thinking about how you respond to certain situations. You may not be able to control everything, but you can control how you react.  Adding humor to your life by watching funny films or TV shows.  Making time for activities that help you relax and not feeling guilty about spending your time this way. Medicines  Your health care provider may suggest certain medicines if he or she feels that they will help improve your condition. Avoid using alcohol and other substances that may prevent your medicines from working properly (may interact). It is also important to:  Talk with your pharmacist or health care provider about all the medicines that you take, their possible side effects, and what medicines are safe to take together.  Make it your goal to take part in all treatment decisions (shared decision-making). This includes giving input on the side  effects of medicines. It is best if shared decision-making with your health care provider is part of your total treatment plan. If your health care provider prescribes a medicine, you may not notice the full benefits of it for 4-8 weeks. Most people who are treated for depression need to be on medicine for at least 6-12 months after they feel better. If you are taking  medicines as part of your treatment, do not stop taking medicines without first talking to your health care provider. You may need to have the medicine slowly decreased (tapered) over time to decrease the risk of harmful side effects. Relationships  Your health care provider may suggest family therapy along with individual therapy and drug therapy. While there may not be family problems that are causing you to feel depressed, it is still important to make sure your family learns as much as they can about your mental health. Having your familys support can help make your treatment successful. How to recognize changes in your condition Everyone has a different response to treatment for depression. Recovery from major depression happens when you have not had signs of major depression for two months. This may mean that you will start to:  Have more interest in doing activities.  Feel less hopeless than you did 2 months ago.  Have more energy.  Overeat less often, or have better or improving appetite.  Have better concentration. Your health care provider will work with you to decide the next steps in your recovery. It is also important to recognize when your condition is getting worse. Watch for these signs:  Having fatigue or low energy.  Eating too much or too little.  Sleeping too much or too little.  Feeling restless, agitated, or hopeless.  Having trouble concentrating or making decisions.  Having unexplained physical complaints.  Feeling irritable, angry, or aggressive. Get help as soon as you or your family members notice these symptoms coming back. How to get support and help from others How to talk with friends and family members about your condition  Talking to friends and family members about your condition can provide you with one way to get support and guidance. Reach out to trusted friends or family members, explain your symptoms to them, and let them know that you are  working with a health care provider to treat your depression. Financial resources  Not all insurance plans cover mental health care, so it is important to check with your insurance carrier. If paying for co-pays or counseling services is a problem, search for a local or county mental health care center. They may be able to offer public mental health care services at low or no cost when you are not able to see a private health care provider. If you are taking medicine for depression, you may be able to get the generic form, which may be less expensive. Some makers of prescription medicines also offer help to patients who cannot afford the medicines they need. Follow these instructions at home:  Get the right amount and quality of sleep.  Cut down on using caffeine, tobacco, alcohol, and other potentially harmful substances.  Try to exercise, such as walking or lifting small weights.  Take over-the-counter and prescription medicines only as told by your health care provider.  Eat a healthy diet that includes plenty of vegetables, fruits, whole grains, low-fat dairy products, and lean protein. Do not eat a lot of foods that are high in solid fats, added sugars, or salt.  Keep all follow-up visits as told by your health care provider. This is important. Contact a health care provider if:  You stop taking your antidepressant medicines, and you have any of these symptoms:  Nausea.  Headache.  Feeling lightheaded.  Chills and body aches.  Not being able to sleep (insomnia).  You or your friends and family think your depression is getting worse. Get help right away if:  You have thoughts of hurting yourself or others. If you ever feel like you may hurt yourself or others, or have thoughts about taking your own life, get help right away. You can go to your nearest emergency department or call:  Your local emergency services (911 in the [place]  A suicide crisis helpline, such as the  [place] at (320)782-2102. This is open 24-hours a day. Summary  If you are living with depression, there are ways to help you recover from it and also ways to prevent it from coming back.  Work with your health care team to create a management plan that includes counseling, stress management techniques, and healthy lifestyle habits. This information is not intended to replace advice given to you by your health care provider. Make sure you discuss any questions you have with your health care provider. Document Released: 03/10/2016 Document Revised: 03/10/2016 Document Reviewed: 03/10/2016 Elsevier Interactive Patient Education  2017 [place]. 1)  The drugs that you have been given will stay in your system until tomorrow so for the       next 24 hours you should not:  A. Drive an automobile  B. Make any legal decisions  C. Drink any alcoholic beverages  2)  You may resume your regular meals upon return home.  3)  A responsible adult must take you home.  Someone should stay with you for a few          hours, then be available by phone for the remainder of the treatment day.  4)  You May experience any of the following symptoms:  Headache, Nausea and a dry mouth (due to the medications you were given),  temporary memory loss and some confusion, or sore muscles (a warm bath  should help this).  If you you experience any of these symptoms let us know on                your return visit.  5)  Report any of the following: any acute discomfort, severe headache, or temperature        greater than 100.5 F.   Also report any unusual redness, swelling, drainage, or pain         at your IV site.    You may report Symptoms to:  [place] at [address] [facility]          Phone: [phone], ECT Department           or Dr. Prescott Gum office [phone]  6)  Your next ECT Treatment is Day Monday  Date March 19th  We will call 2 days prior to your scheduled appointment for  arrival times.  7)  Nothing to eat or drink after midnight the night before your procedure.  8)  Take .     With a sip of water the morning of your procedure.  9)  Other Instructions: Call [phone] to cancel the morning of your procedure due         to illness or emergency.  10) We will call within 72  hours to assess how you are feeling. Living With Depression Everyone experiences occasional disappointment, sadness, and loss in their lives. When you are feeling down, blue, or sad for at least 2 weeks in a row, it may mean that you have depression. Depression can affect your thoughts and feelings, relationships, daily activities, and physical health. It is caused by changes in the way your brain functions. If you receive a diagnosis of depression, your health care provider will tell you which type of depression you have and what treatment options are available to you. If you are living with depression, there are ways to help you recover from it and also ways to prevent it from coming back. How to cope with lifestyle changes Coping with stress  Stress is your bodys reaction to life changes and events, both good and bad. Stressful situations may include:  Getting married.  The death of a spouse.  Losing a job.  Retiring.  Having a baby. Stress can last just a few hours or it can be ongoing. Stress can play a major role in depression, so it is important to learn both how to cope with stress and how to think about it differently. Talk with your health care provider or a counselor if you would like to learn more about stress reduction. He or she may suggest some stress reduction techniques, such as:  Music therapy. This can include creating music or listening to music. Choose music that you enjoy and that inspires you.  Mindfulness-based meditation. This kind of meditation can be done while sitting or walking. It involves being aware of your normal breaths, rather than trying to control  your breathing.  Centering prayer. This is a kind of meditation that involves focusing on a spiritual word or phrase. Choose a word, phrase, or sacred image that is meaningful to you and that brings you peace.  Deep breathing. To do this, expand your stomach and inhale slowly through your nose. Hold your breath for 3-5 seconds, then exhale slowly, allowing your stomach muscles to relax.  Muscle relaxation. This involves intentionally tensing muscles then relaxing them. Choose a stress reduction technique that fits your lifestyle and personality. Stress reduction techniques take time and practice to develop. Set aside 5-15 minutes a day to do them. Therapists can offer training in these techniques. The training may be covered by some insurance plans. Other things you can do to manage stress include:  Keeping a stress diary. This can help you learn what triggers your stress and ways to control your response.  Understanding what your limits are and saying no to requests or events that lead to a schedule that is too full.  Thinking about how you respond to certain situations. You may not be able to control everything, but you can control how you react.  Adding humor to your life by watching funny films or TV shows.  Making time for activities that help you relax and not feeling guilty about spending your time this way. Medicines  Your health care provider may suggest certain medicines if he or she feels that they will help improve your condition. Avoid using alcohol and other substances that may prevent your medicines from working properly (may interact). It is also important to:  Talk with your pharmacist or health care provider about all the medicines that you take, their possible side effects, and what medicines are safe to take together.  Make it your goal to take part in all treatment decisions (shared decision-making). This  includes giving input on the side effects of medicines. It is best  if shared decision-making with your health care provider is part of your total treatment plan. If your health care provider prescribes a medicine, you may not notice the full benefits of it for 4-8 weeks. Most people who are treated for depression need to be on medicine for at least 6-12 months after they feel better. If you are taking medicines as part of your treatment, do not stop taking medicines without first talking to your health care provider. You may need to have the medicine slowly decreased (tapered) over time to decrease the risk of harmful side effects. Relationships  Your health care provider may suggest family therapy along with individual therapy and drug therapy. While there may not be family problems that are causing you to feel depressed, it is still important to make sure your family learns as much as they can about your mental health. Having your familys support can help make your treatment successful. How to recognize changes in your condition Everyone has a different response to treatment for depression. Recovery from major depression happens when you have not had signs of major depression for two months. This may mean that you will start to:  Have more interest in doing activities.  Feel less hopeless than you did 2 months ago.  Have more energy.  Overeat less often, or have better or improving appetite.  Have better concentration. Your health care provider will work with you to decide the next steps in your recovery. It is also important to recognize when your condition is getting worse. Watch for these signs:  Having fatigue or low energy.  Eating too much or too little.  Sleeping too much or too little.  Feeling restless, agitated, or hopeless.  Having trouble concentrating or making decisions.  Having unexplained physical complaints.  Feeling irritable, angry, or aggressive. Get help as soon as you or your family members notice these symptoms coming  back. How to get support and help from others How to talk with friends and family members about your condition  Talking to friends and family members about your condition can provide you with one way to get support and guidance. Reach out to trusted friends or family members, explain your symptoms to them, and let them know that you are working with a health care provider to treat your depression. Financial resources  Not all insurance plans cover mental health care, so it is important to check with your insurance carrier. If paying for co-pays or counseling services is a problem, search for a local or county mental health care center. They may be able to offer public mental health care services at low or no cost when you are not able to see a private health care provider. If you are taking medicine for depression, you may be able to get the generic form, which may be less expensive. Some makers of prescription medicines also offer help to patients who cannot afford the medicines they need. Follow these instructions at home:  Get the right amount and quality of sleep.  Cut down on using caffeine, tobacco, alcohol, and other potentially harmful substances.  Try to exercise, such as walking or lifting small weights.  Take over-the-counter and prescription medicines only as told by your health care provider.  Eat a healthy diet that includes plenty of vegetables, fruits, whole grains, low-fat dairy products, and lean protein. Do not eat a lot of foods that are high in solid  fats, added sugars, or salt.  Keep all follow-up visits as told by your health care provider. This is important. Contact a health care provider if:  You stop taking your antidepressant medicines, and you have any of these symptoms:  Nausea.  Headache.  Feeling lightheaded.  Chills and body aches.  Not being able to sleep (insomnia).  You or your friends and family think your depression is getting worse. Get help  right away if:  You have thoughts of hurting yourself or others. If you ever feel like you may hurt yourself or others, or have thoughts about taking your own life, get help right away. You can go to your nearest emergency department or call:  Your local emergency services (911 in the [place]  A suicide crisis helpline, such as the [place] at 847 433 1691. This is open 24-hours a day. Summary  If you are living with depression, there are ways to help you recover from it and also ways to prevent it from coming back.  Work with your health care team to create a management plan that includes counseling, stress management techniques, and healthy lifestyle habits. This information is not intended to replace advice given to you by your health care provider. Make sure you discuss any questions you have with your health care provider. Document Released: 03/10/2016 Document Revised: 03/10/2016 Document Reviewed: 03/10/2016 Elsevier Interactive Patient Education  2017 [place]. 1)  The drugs that you have been given will stay in your system until tomorrow so for the       next 24 hours you should not:  A. Drive an automobile  B. Make any legal decisions  C. Drink any alcoholic beverages  2)  You may resume your regular meals upon return home.  3)  A responsible adult must take you home.  Someone should stay with you for a few          hours, then be available by phone for the remainder of the treatment day.  4)  You May experience any of the following symptoms:  Headache, Nausea and a dry mouth (due to the medications you were given),  temporary memory loss and some confusion, or sore muscles (a warm bath  should help this).  If you you experience any of these symptoms let us know on                your return visit.  5)  Report any of the following: any acute discomfort, severe headache, or temperature        greater than 100.5 F.   Also report any unusual  redness, swelling, drainage, or pain         at your IV site.    You may report Symptoms to:  [place] at [facility]          Phone: [phone], ECT Department           or Dr. Prescott Gum office [phone]  6)  Your next ECT Treatment is Day Monday  Date March 19th 2018  We will call 2 days prior to your scheduled appointment for arrival times.  7)  Nothing to eat or drink after midnight the night before your procedure.  8)  Take .    With a sip of water the morning of your procedure.  9)  Other Instructions: Call [phone] to cancel the morning of your procedure due         to illness or emergency.  10) We will call within 72 hours to assess how you are feeling.

## 2016-07-04 NOTE — [facility] Suicide Risk Assessment (Signed)
[facility] Discharge Suicide Risk Assessment   Principal Problem: Severe recurrent major depression without psychotic features [facility]) Discharge Diagnoses:  Patient Active Problem List   Diagnosis Date Noted  . Severe recurrent major depression without psychotic features ([place]) [F33.2] 06/19/2016  . Hepatic encephalopathy ([place]) [K72.90] 08/13/2015  . Thrombocytopenia ([place]) [D69.6] 07/23/2015  . Amnesia [R41.3] 03/20/2015  . Major depression in remission ([place]) [F32.5] 03/12/2015  . Gonalgia [M25.569] 01/05/2015  . Absolute anemia [D64.9] 11/27/2014  . Arthritis [M19.90] 11/27/2014  . Acid reflux [K21.9] 11/27/2014  . Depression, major, recurrent, in partial remission ([place]) [F33.41] 11/27/2014  . Encounter for general adult medical examination without abnormal findings [Z00.00] 08/25/2014  . Chronic LBP [M54.5, G89.29] 08/15/2014  . 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 Infraspinatus tenosynovitis [M65.819] 05/01/2014  . Other synovitis and tenosynovitis, right shoulder [M65.811] 05/01/2014  . Difficulty in walking [R26.2] 11/30/2013  . Extreme obesity ([place]) [E66.01] 11/30/2013  . Morbid obesity ([place]) [E66.01] 11/30/2013  . Arthritis, degenerative [M19.90] 10/01/2013  . Steatohepatitis [K75.81] 10/01/2013  . Orthostasis [I95.1] 10/01/2013  . Appendicular ataxia [R27.0] 09/26/2013  . Fall [W19.XXXA] 09/26/2013  . Dizziness [R42] 09/07/2013  . Osteoporosis with fracture [M80.00XA] 09/06/2013  . Clinical depression [F32.9] 03/25/2012  . Adult hypothyroidism [E03.9] 03/25/2012  . Esophageal stenosis [K22.2] 07/03/2009  . Back ache [M54.9] 03/08/2003  . Anxiety state [F41.1] 12/27/2002    Total Time spent with patient: 1 hour  Musculoskeletal: Strength & Muscle Tone: within normal limits Gait & Station: normal Patient leans: N/A  Psychiatric Specialty Exam: Review of Systems  Constitutional: Negative.   HENT: Positive for congestion and sinus pain.    Eyes: Negative.   Respiratory: Negative.   Cardiovascular: Negative.   Gastrointestinal: Negative.   Musculoskeletal: Negative.   Skin: Negative.   Neurological: Negative.   Psychiatric/Behavioral: Negative for depression, hallucinations, memory loss, substance abuse and suicidal ideas. The patient is not nervous/anxious and does not have insomnia.     Blood pressure 137/79, pulse 81, temperature 98.6 F (37 C), resp. rate (!) 27, height 5' 2"  (1.575 m), weight 80.7 kg (178 lb), SpO2 92 %.Body mass index is 32.56 kg/m.  General Appearance: Casual  Eye Contact::  Fair  Speech:  Clear and [id number]  Volume:  Normal  Mood:  Euthymic  Affect:  Constricted  Thought Process:  Goal Directed  Orientation:  Full (Time, Place, and Person)  Thought Content:  Logical  Suicidal Thoughts:  No  Homicidal Thoughts:  No  Memory:  Immediate;   Fair Recent;   Fair Remote;   Fair  Judgement:  Fair  Insight:  Fair  Psychomotor Activity:  Normal  Concentration:  Fair  Recall:  [employer] of Knowledge:Fair  Language: Fair  Akathisia:  No  Handed:  Right  AIMS (if indicated):     Assets:  Communication Skills Desire for Improvement Financial Resources/Insurance Housing Resilience Social Support  Sleep:  Number of Hours: 6.75  Cognition: WNL  ADL's:  Intact   Mental Status Per Nursing Assessment::   On Admission:     Demographic Factors:  Divorced or widowed and Caucasian  Loss Factors: Loss of significant relationship  Historical Factors: Family history of suicide  Risk Reduction Factors:   Sense of responsibility to family, Religious beliefs about death, Living with another person, especially a relative and Positive social support  Continued Clinical Symptoms:  Depression:   Anhedonia  Cognitive Features That Contribute To Risk:  Loss of executive function  Suicide Risk:  Mild:  Suicidal ideation of limited frequency, intensity, duration, and specificity.  There are  no identifiable plans, no associated intent, mild dysphoria and related symptoms, good self-control (both objective and subjective assessment), few other risk factors, and identifiable protective factors, including available and accessible social support.  Follow-up Information    [facility]. Go on 07/10/2016.   Specialty:  Behavioral Health Why:  Please attend your follow up appointment with Dr Einar Grad on 07/10/16 at 1:30pm.  Please bring a copy of your hospital discharge paperwork. Contact information: [place] [phone]       [facility]. Go on 07/07/2016.   Why:  Please attend your follow up outpatient ECT appointment at [facility] on 07/07/16 at 830am. Contact information: [place] [id number] [address] [place] [phone]          Plan Of Care/Follow-up recommendations:  Activity:  Activity as tolerated. In fact patient has been urged to be as physically active as possible Diet:  Regular diet Other:  Follow-up with ECT Monday morning.  Alethia Berthold, MD 07/04/2016, 12:57 PM

## 2016-07-04 NOTE — Progress Notes (Signed)
Patient denies SI/HI, denies A/V hallucinations. Patient verbalizes understanding of discharge instructions, follow up care and prescriptions. Patient given all belongings from [facility] locker.Family verbalized understanding about ECT on Monday 8;30am. Patient escorted out by staff, transported by family.

## 2016-07-04 NOTE — Discharge Summary (Signed)
Physician Discharge Summary Note  Patient:  Cassidy [name] is an 76 y.o., female MRN:  [id number] DOB:  March 31, 1941 Patient phone:  [phone] (home)  Patient address:   [address] [place] [address],  Total Time spent with patient: 1 hour  Date of Admission:  06/20/2016 Date of Discharge: 07/04/2016  Reason for Admission:  Patient was admitted because of severe recurrent depression not responding to medication with a goal will in part to starting ECT treatment  Principal Problem: Severe recurrent major depression without psychotic features [facility]) Discharge Diagnoses: Patient Active Problem List   Diagnosis Date Noted  . Severe recurrent major depression without psychotic features ([place]) [F33.2] 06/19/2016  . Hepatic encephalopathy ([place]) [K72.90] 08/13/2015  . Thrombocytopenia ([place]) [D69.6] 07/23/2015  . Amnesia [R41.3] 03/20/2015  . Major depression in remission ([place]) [F32.5] 03/12/2015  . Gonalgia [M25.569] 01/05/2015  . Absolute anemia [D64.9] 11/27/2014  . Arthritis [M19.90] 11/27/2014  . Acid reflux [K21.9] 11/27/2014  . Depression, major, recurrent, in partial remission ([place]) [F33.41] 11/27/2014  . Encounter for general adult medical examination without abnormal findings [Z00.00] 08/25/2014  . Chronic LBP [M54.5, G89.29] 08/15/2014  . 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 Infraspinatus tenosynovitis [M65.819] 05/01/2014  . Other synovitis and tenosynovitis, right shoulder [M65.811] 05/01/2014  . Difficulty in walking [R26.2] 11/30/2013  . Extreme obesity ([place]) [E66.01] 11/30/2013  . Morbid obesity ([place]) [E66.01] 11/30/2013  . Arthritis, degenerative [M19.90] 10/01/2013  . Steatohepatitis [K75.81] 10/01/2013  . Orthostasis [I95.1] 10/01/2013  . Appendicular ataxia [R27.0] 09/26/2013  . Fall [W19.XXXA] 09/26/2013  . Dizziness [R42] 09/07/2013  . Osteoporosis with fracture [M80.00XA] 09/06/2013  . Clinical depression [F32.9]  03/25/2012  . Adult hypothyroidism [E03.9] 03/25/2012  . Esophageal stenosis [K22.2] 07/03/2009  . Back ache [M54.9] 03/08/2003  . Anxiety state [F41.1] 12/27/2002    Past Psychiatric History: Patient has a long-standing history of depression and has had multiple medication trials without sustained benefit.  Past Medical History:  Past Medical History:  Diagnosis Date  . Anxiety   . Depression   . Fatty liver   . GERD (gastroesophageal reflux disease)   . Hypothyroidism   . Obesity   . Thyroid disease     Past Surgical History:  Procedure Laterality Date  . APPENDECTOMY    . ESOPHAGOGASTRODUODENOSCOPY (EGD) WITH PROPOFOL N/A 12/20/2014   Procedure: ESOPHAGOGASTRODUODENOSCOPY (EGD) WITH PROPOFOL;  Surgeon: Manya Silvas, MD;  Location[facility] ENDOSCOPY;  Service: Endoscopy;  Laterality: N/A;  . SAVORY DILATION N/A 12/20/2014   Procedure: SAVORY DILATION;  Surgeon: Manya Silvas, MD;  Location[facility] ENDOSCOPY;  Service: Endoscopy;  Laterality: N/A;  . TONSILLECTOMY     Family History:  Family History  Problem Relation Age of Onset  . Diabetes Mother   . COPD Mother   . Kidney disease Mother   . Depression Mother   . Heart attack Father   . Aneurysm Father   . Anxiety disorder Sister   . Depression Sister   . Diabetes Sister   . Hypertension Sister   . Atrial fibrillation Brother   . Spinal muscular atrophy Brother   . Breast cancer Sister   . Bone cancer Sister   . Depression Sister   . Anxiety disorder Sister   . Breast cancer Sister   . Colon cancer Sister   . Depression Sister   . Diabetes Sister   . COPD Sister   . Depression Sister   . Anxiety disorder Sister   .  Diabetes Brother   . Depression Brother    Family Psychiatric  History: Positive for anxiety and depression Social History:  History  Alcohol Use No     History  Drug Use No    Social History   Social History  . Marital status: Widowed    Spouse name: N/A  . Number of children: N/A   . Years of education: N/A   Social History Main Topics  . Smoking status: Never Smoker  . Smokeless tobacco: Never Used  . Alcohol use No  . Drug use: No  . Sexual activity: Not Currently    Birth control/ protection: Post-menopausal   Other Topics Concern  . None   Social History Narrative  . None    Hospital Course:  Patient has received 5 ECT treatments. Tolerated them well. Has been On Antidepressant Medicine and Encouraged to Engage in Groups. Initially He Showed Little Improvement. Last Couple Treatment Seems to Have Shown Much Improvement in Her Energy and Motivation. Totally Denies Suicidal Ideation. Last Day She Started Complaining of Congestion in Her Nose. Possible Findings of a Sinus Infection. Will Start Antibiotics at Discharge.  Physical Findings: AIMS:  , ,  ,  ,    CIWA:    COWS:     Musculoskeletal: Strength & Muscle Tone: within normal limits Gait & Station: normal Patient leans: N/A  Psychiatric Specialty Exam: Physical Exam  Nursing note and vitals reviewed. Constitutional: She appears well-developed and well-nourished.  HENT:  Head: Normocephalic and atraumatic.  Eyes: Conjunctivae are normal. Pupils are equal, round, and reactive to light.  Neck: Normal range of motion.  Cardiovascular: Regular rhythm and normal heart sounds.   Respiratory: Effort normal. No respiratory distress.  GI: Soft.  Musculoskeletal: Normal range of motion.  Neurological: She is alert.  Skin: Skin is warm and dry.  Psychiatric: She has a normal mood and affect. Her speech is normal and behavior is normal. Thought content normal. She expresses impulsivity. She exhibits abnormal recent memory.    Review of Systems  Constitutional: Negative.   HENT: Positive for congestion and sinus pain.   Eyes: Negative.   Respiratory: Negative.   Cardiovascular: Negative.   Gastrointestinal: Negative.   Musculoskeletal: Negative.   Skin: Negative.   Neurological: Negative.    Psychiatric/Behavioral: Negative for depression, hallucinations, memory loss, substance abuse and suicidal ideas. The patient is not nervous/anxious and does not have insomnia.     Blood pressure 137/79, pulse 81, temperature 98.6 F (37 C), resp. rate (!) 27, height 5' 2"  (1.575 m), weight 80.7 kg (178 lb), SpO2 92 %.Body mass index is 32.56 kg/m.  General Appearance: Casual  Eye Contact:  Good  Speech:  Normal Rate  Volume:  Normal  Mood:  Dysphoric  Affect:  Congruent  Thought Process:  Goal Directed  Orientation:  Full (Time, Place, and Person)  Thought Content:  Logical  Suicidal Thoughts:  No  Homicidal Thoughts:  No  Memory:  Immediate;   Good Recent;   Fair Remote;   Fair  Judgement:  Fair  Insight:  Fair  Psychomotor Activity:  Decreased  Concentration:  Concentration: Fair  Recall:  [employer] of Knowledge:  Fair  Language:  Fair  Akathisia:  No  Handed:  Right  AIMS (if indicated):     Assets:  Desire for Improvement Financial Resources/Insurance Housing Resilience Social Support  ADL's:  Intact  Cognition:  WNL  Sleep:  Number of Hours: 6.75     Have you  used any form of tobacco in the last 30 days? (Cigarettes, Smokeless Tobacco, Cigars, and/or Pipes): No  Has this patient used any form of tobacco in the last 30 days? (Cigarettes, Smokeless Tobacco, Cigars, and/or Pipes) Yes, No  Blood Alcohol level:  Lab Results  Component Value Date   ETH <5 35/46/5681    Metabolic Disorder Labs:  Lab Results  Component Value Date   HGBA1C 5.3 06/21/2016   MPG 105 06/21/2016   No results found for: PROLACTIN Lab Results  Component Value Date   CHOL 145 06/21/2016   TRIG 51 06/21/2016   HDL 44 06/21/2016   CHOLHDL 3.3 06/21/2016   VLDL 10 06/21/2016   LDLCALC 91 06/21/2016   LDLCALC 70 08/22/2013    See Psychiatric Specialty Exam and Suicide Risk Assessment completed by Attending Physician prior to discharge.  Discharge destination:  Home  Is  patient on multiple antipsychotic therapies at discharge:  No   Has Patient had three or more failed trials of antipsychotic monotherapy by history:  No  Recommended Plan for Multiple Antipsychotic Therapies: NA  Discharge Instructions    Diet - low sodium heart healthy    Complete by:  As directed    Increase activity slowly    Complete by:  As directed      Allergies as of 07/04/2016      Reactions   Sulfa Antibiotics Shortness Of Breath        Follow-up Information     [facility] Go on [facility].   Specialty:  Behavioral Health Why:  Please attend your follow up appointment with Dr Einar Grad on 07/10/16 at 1:30pm.  Please bring a copy of your hospital discharge paperwork. Contact information: [place] [phone]       [facility]. Go on 07/07/2016.   Why:  Please attend your follow up outpatient ECT appointment at [facility] on 07/07/16 at 830am. Contact information: [place] [id number] ar [place] [phone]          Follow-up recommendations:  Activity:  Activity as tolerated Diet:  Regular diet Other:  Follow-up with psychiatric care as noted  Comments:  Patient started on antibiotics. Urged to follow up with primary care doctor and psychiatrist. We will also like to see her back for continued ECT treatment on Monday.  Signed: Alethia Berthold, MD 07/04/2016, 1:14 PM

## 2016-07-04 NOTE — Anesthesia Post-op Follow-up Note (Cosign Needed)
Anesthesia QCDR form completed.        

## 2016-07-06 NOTE — Anesthesia Postprocedure Evaluation (Signed)
Anesthesia Post Note  Patient: Cassidy [name]  Procedure(s) Performed: * No procedures listed *  Patient location during evaluation: PACU Anesthesia Type: General Level of consciousness: awake and alert Pain management: pain level controlled Vital Signs Assessment: post-procedure vital signs reviewed and stable Respiratory status: spontaneous breathing, nonlabored ventilation, respiratory function stable and patient connected to nasal cannula oxygen Cardiovascular status: blood pressure returned to baseline and stable Postop Assessment: no signs of nausea or vomiting Anesthetic complications: no     Last Vitals:  Vitals:   07/04/16 1328 07/04/16 1500  BP: (!) 136/57 (!) 125/43  Pulse: 80 79  Resp: (!) 23 20  Temp:      Last Pain:  Vitals:   07/04/16 1238  TempSrc:   PainSc: 0-No pain                 Precious Haws Piscitello

## 2016-07-07 ENCOUNTER — Ambulatory Visit
Admit: 2016-07-07 | Discharge: 2016-07-07 | Disposition: A | Discharge status: Home / Self Care | Payer: Medicare Other | Source: Ambulatory Visit | Attending: Psychiatry | Admitting: Psychiatry

## 2016-07-07 ENCOUNTER — Encounter: Payer: Self-pay | Admitting: Anesthesiology

## 2016-07-07 ENCOUNTER — Other Ambulatory Visit: Payer: Self-pay | Admitting: Psychiatry

## 2016-07-07 DIAGNOSIS — E669 Obesity, unspecified: Secondary | ICD-10-CM | POA: Diagnosis not present

## 2016-07-07 DIAGNOSIS — Z79899 Other long term (current) drug therapy: Secondary | ICD-10-CM | POA: Insufficient documentation

## 2016-07-07 DIAGNOSIS — F332 Major depressive disorder, recurrent severe without psychotic features: Secondary | ICD-10-CM | POA: Diagnosis not present

## 2016-07-07 DIAGNOSIS — E039 Hypothyroidism, unspecified: Secondary | ICD-10-CM | POA: Diagnosis not present

## 2016-07-07 DIAGNOSIS — F339 Major depressive disorder, recurrent, unspecified: Secondary | ICD-10-CM | POA: Insufficient documentation

## 2016-07-07 DIAGNOSIS — Z881 Allergy status to other antibiotic agents status: Secondary | ICD-10-CM | POA: Diagnosis not present

## 2016-07-07 DIAGNOSIS — Z6832 Body mass index (BMI) 32.0-32.9, adult: Secondary | ICD-10-CM | POA: Insufficient documentation

## 2016-07-07 DIAGNOSIS — Z7951 Long term (current) use of inhaled steroids: Secondary | ICD-10-CM | POA: Diagnosis not present

## 2016-07-07 DIAGNOSIS — K219 Gastro-esophageal reflux disease without esophagitis: Secondary | ICD-10-CM | POA: Diagnosis not present

## 2016-07-07 MED ORDER — ONDANSETRON HCL 4 MG/2ML IJ SOLN: 4.0000 mg | Freq: Once | INTRAMUSCULAR | Status: DC | PRN

## 2016-07-07 MED ORDER — FENTANYL CITRATE (PF) 100 MCG/2ML IJ SOLN: 25.0000 ug | INTRAMUSCULAR | Status: DC | PRN

## 2016-07-07 MED ORDER — SUCCINYLCHOLINE CHLORIDE 20 MG/ML IJ SOLN
INTRAMUSCULAR | Status: DC | PRN
  Administered 2016-07-07: 80 mg via INTRAVENOUS

## 2016-07-07 MED ORDER — SODIUM CHLORIDE 0.9 % IV SOLN
INTRAVENOUS | Status: DC | PRN
  Administered 2016-07-07: 10:00:00 via INTRAVENOUS

## 2016-07-07 MED ORDER — LABETALOL HCL 5 MG/ML IV SOLN
INTRAVENOUS | Status: DC | PRN
  Administered 2016-07-07: 20 mg via INTRAVENOUS

## 2016-07-07 MED ORDER — METHOHEXITAL SODIUM 100 MG/10ML IV SOSY
PREFILLED_SYRINGE | INTRAVENOUS | Status: DC | PRN
  Administered 2016-07-07: 80 mg via INTRAVENOUS

## 2016-07-07 MED ORDER — SODIUM CHLORIDE 0.9 % IV SOLN
500.0000 mL | Freq: Once | INTRAVENOUS | Status: AC
  Administered 2016-07-07: 09:00:00 via INTRAVENOUS

## 2016-07-07 NOTE — Transfer of Care (Signed)
Immediate Anesthesia Transfer of Care Note  Patient: Cassidy [name]  Procedure(s) Performed: * No procedures listed *  Patient Location: PACU  Anesthesia Type:General  Level of Consciousness: awake and alert   Airway & Oxygen Therapy: Patient Spontanous Breathing and Patient connected to face mask oxygen  Post-op Assessment: Report given to RN and Post -op Vital signs reviewed and stable  Post vital signs: Reviewed and stable  Last Vitals:  Vitals:   07/07/16 0837 07/07/16 1052  BP: (!) 131/56 (!) 127/93  Pulse: 79 82  Resp: 19 (!) 27  Temp: 36.7 C 36.7 C    Last Pain:  Vitals:   07/07/16 0837  TempSrc: Oral         Complications: No apparent anesthesia complications

## 2016-07-07 NOTE — Progress Notes (Signed)
Recreation Therapy Notes  INPATIENT RECREATION TR PLAN  Patient Details Name: KATHLEEN TAMM MRN: [id number] DOB: [birth year] Today's Date: 07/07/2016  Rec Therapy Plan Is patient appropriate for Therapeutic Recreation?: Yes Treatment times per week: At least once a week TR Treatment/Interventions: 1:1 session, Group participation (Comment) (Appropriate participation in daily recreational therapy tx)  Discharge Criteria Pt will be discharged from therapy if:: Treatment goals are met, Discharged Treatment plan/goals/alternatives discussed and agreed upon by:: Patient/family  Discharge Summary Short term goals set: See Care Plan Short term goals met: Complete Progress toward goals comments: One-to-one attended Which groups?: Leisure education, Other (Comment) (Self-expression) One-to-one attended: Stress management Reason goals not met: N/A Therapeutic equipment acquired: None Reason patient discharged from therapy: Discharge from hospital Pt/family agrees with progress & goals achieved: Yes Date patient discharged from therapy: 07/04/16   Leonette Monarch, LRT/CTRS 07/07/2016, 12:11 PM

## 2016-07-07 NOTE — Anesthesia Preprocedure Evaluation (Signed)
Anesthesia Evaluation  Patient identified by MRN, date of birth, ID band Patient awake    Reviewed: Allergy & Precautions, NPO status , Patient's Chart, lab work & pertinent test results, reviewed documented beta blocker date and time   Airway Mallampati: III  TM Distance: >3 FB     Dental  (+) Chipped   Pulmonary           Cardiovascular      Neuro/Psych PSYCHIATRIC DISORDERS Anxiety Depression    GI/Hepatic GERD  Controlled,(+) Hepatitis -  Endo/Other  Hypothyroidism   Renal/GU      Musculoskeletal  (+) Arthritis ,   Abdominal   Peds  Hematology  (+) anemia ,   Anesthesia Other Findings Runs a low O2 saturation.  Reproductive/Obstetrics                             Anesthesia Physical Anesthesia Plan  ASA: III  Anesthesia Plan: General   Post-op Pain Management:    Induction: Intravenous  Airway Management Planned: Nasal Cannula  Additional Equipment:   Intra-op Plan:   Post-operative Plan:   Informed Consent: I have reviewed the patients History and Physical, chart, labs and discussed the procedure including the risks, benefits and alternatives for the proposed anesthesia with the patient or authorized representative who has indicated his/her understanding and acceptance.     Plan Discussed with: CRNA  Anesthesia Plan Comments:         Anesthesia Quick Evaluation

## 2016-07-07 NOTE — Procedures (Signed)
ECT SERVICES Physician's Interval Evaluation & Treatment Note  Patient Identification: Cassidy [name] MRN:  [id number] Date of Evaluation:  07/07/2016 TX #: 6  MADRS:   MMSE:   P.E. Findings:  No change to physical exam. Still has some tenderness in the sinus area. Vital stable heart and lungs normal.  Psychiatric Interval Note:  Mood is better affect better energy level better.  Subjective:  Patient is a 76 y.o. female seen for evaluation for Electroconvulsive Therapy. Mainly complaining of the nasal discomfort. Mood improved.  Treatment Summary:   [x]   Right Unilateral             []  Bilateral   % Energy : 0.3 ms 80%   Impedance: 1840 ohms  Seizure Energy Index: 2275 V squared  Postictal Suppression Index: 45%  Seizure Concordance Index: 71%  Medications  Pre Shock: Brevital 80 mg succinylcholine 80 mg  Post Shock:    Seizure Duration: 12 seconds by EMG 32 seconds by EEG   Comments: Follow-up Wednesday   Lungs:  [x]   Clear to auscultation               []  Other:   Heart:    [x]   Regular rhythm             []  irregular rhythm    [x]   Previous H&P reviewed, patient examined and there are NO CHANGES                 []   Previous H&P reviewed, patient examined and there are changes noted.   Alethia Berthold, MD 3/19/201810:33 AM

## 2016-07-07 NOTE — Discharge Instructions (Signed)
1)  The drugs that you have been given will stay in your system until tomorrow so for the       next 24 hours you should not:  A. Drive an automobile  B. Make any legal decisions  C. Drink any alcoholic beverages  2)  You may resume your regular meals upon return home.  3)  A responsible adult must take you home.  Someone should stay with you for a few          hours, then be available by phone for the remainder of the treatment day.  4)  You May experience any of the following symptoms:  Headache, Nausea and a dry mouth (due to the medications you were given),  temporary memory loss and some confusion, or sore muscles (a warm bath  should help this).  If you you experience any of these symptoms let us know on                your return visit.  5)  Report any of the following: any acute discomfort, severe headache, or temperature        greater than 100.5 F.   Also report any unusual redness, swelling, drainage, or pain         at your IV site.    You may report Symptoms to:  [place] [facility]          Phone: [phone], ECT Department           or Dr. Prescott Gum office [phone]  6)  Your next ECT Treatment is Wednesday July 09, 2016   We will call 2 days prior to your scheduled appointment for arrival times.  7)  Nothing to eat or drink after midnight the night before your procedure.  8)  Take    With a sip of water the morning of your procedure.  9)  Other Instructions: Call [phone] to cancel the morning of your procedure due         to illness or emergency.  10) We will call within 72 hours to assess how you are feeling.

## 2016-07-07 NOTE — Anesthesia Post-op Follow-up Note (Cosign Needed)
Anesthesia QCDR form completed.        

## 2016-07-07 NOTE — H&P (Signed)
Cassidy [name] is an 76 y.o. female.   Chief Complaint: Mood is improved. Energy improving. HPI: History of severe depression that is responding to ECT today as treatment #6.  Past Medical History:  Diagnosis Date  . Anxiety   . Depression   . Fatty liver   . GERD (gastroesophageal reflux disease)   . Hypothyroidism   . Obesity   . Thyroid disease     Past Surgical History:  Procedure Laterality Date  . APPENDECTOMY    . ESOPHAGOGASTRODUODENOSCOPY (EGD) WITH PROPOFOL N/A 12/20/2014   Procedure: ESOPHAGOGASTRODUODENOSCOPY (EGD) WITH PROPOFOL;  Surgeon: Manya Silvas, MD;  Location: [facility] ENDOSCOPY;  Service: Endoscopy;  Laterality: N/A;  . SAVORY DILATION N/A 12/20/2014   Procedure: SAVORY DILATION;  Surgeon: Manya Silvas, MD;  Location: [facility] ENDOSCOPY;  Service: Endoscopy;  Laterality: N/A;  . TONSILLECTOMY      Family History  Problem Relation Age of Onset  . Diabetes Mother   . COPD Mother   . Kidney disease Mother   . Depression Mother   . Heart attack Father   . Aneurysm Father   . Anxiety disorder Sister   . Depression Sister   . Diabetes Sister   . Hypertension Sister   . Atrial fibrillation Brother   . Spinal muscular atrophy Brother   . Breast cancer Sister   . Bone cancer Sister   . Depression Sister   . Anxiety disorder Sister   . Breast cancer Sister   . Colon cancer Sister   . Depression Sister   . Diabetes Sister   . COPD Sister   . Depression Sister   . Anxiety disorder Sister   . Diabetes Brother   . Depression Brother    Social History:  reports that she has never smoked. She has never used smokeless tobacco. She reports that she does not drink alcohol or use drugs.  Allergies:  Allergies  Allergen Reactions  . Sulfa Antibiotics Shortness Of Breath     (Not in a hospital admission)  No results found for this or any previous visit (from the past 48 hour(s)). No results found.  Review of Systems  Constitutional: Negative.   HENT:  Positive for congestion and sinus pain.   Eyes: Negative.   Respiratory: Negative.   Cardiovascular: Negative.   Gastrointestinal: Negative.   Musculoskeletal: Negative.   Skin: Negative.   Neurological: Negative.   Psychiatric/Behavioral: Negative for depression, hallucinations, memory loss, substance abuse and suicidal ideas. The patient is not nervous/anxious and does not have insomnia.     Blood pressure (!) 131/56, pulse 79, temperature 98.1 F (36.7 C), temperature source Oral, resp. rate 19, height 5' 2"  (1.575 m), weight 81.6 kg (180 lb), SpO2 97 %. Physical Exam  Nursing note and vitals reviewed. Constitutional: She appears well-developed and well-nourished.  HENT:  Head: Normocephalic and atraumatic.    Eyes: Conjunctivae are normal. Pupils are equal, round, and reactive to light.  Neck: Normal range of motion.  Cardiovascular: Normal heart sounds.   Respiratory: Effort normal.  GI: Soft.  Musculoskeletal: Normal range of motion.  Neurological: She is alert.  Skin: Skin is warm and dry.  Psychiatric: She has a normal mood and affect. Judgment normal. Her speech is delayed. She is slowed. Cognition and memory are normal. She expresses no suicidal ideation.     Assessment/Plan ECT to continue possibly through the entire week at least until Wednesday. Patient is still complaining of sinus discomfort. We talked about this and that  she is to continue the antibiotic we will reassess and if she is not better Wednesday we might change antibiotics. Patient is encouraged to use decongestants. Spoke about this with her sister.  Alethia Berthold, MD 07/07/2016, 10:31 AM

## 2016-07-07 NOTE — Anesthesia Postprocedure Evaluation (Signed)
Anesthesia Post Note  Patient: Cassidy [name]  Procedure(s) Performed: * No procedures listed *  Patient location during evaluation: PACU Anesthesia Type: General Level of consciousness: awake and alert Pain management: pain level controlled Vital Signs Assessment: post-procedure vital signs reviewed and stable Respiratory status: spontaneous breathing, nonlabored ventilation, respiratory function stable and patient connected to nasal cannula oxygen Cardiovascular status: blood pressure returned to baseline and stable Postop Assessment: no signs of nausea or vomiting Anesthetic complications: no     Last Vitals:  Vitals:   07/07/16 1132 07/07/16 1137  BP: 130/66 128/73  Pulse: 80 80  Resp: (!) 23 20  Temp:  37.3 C    Last Pain:  Vitals:   07/07/16 1137  TempSrc: Oral                 Anitra Doxtater S

## 2016-07-09 ENCOUNTER — Telehealth: Payer: Self-pay

## 2016-07-09 ENCOUNTER — Telehealth: Payer: Self-pay | Admitting: *Deleted

## 2016-07-09 ENCOUNTER — Other Ambulatory Visit: Payer: Self-pay | Admitting: Psychiatry

## 2016-07-09 NOTE — Progress Notes (Addendum)
Returned phone call from [place] (sister).  Sister voiced concerns about increased pain in pts nostril.  [name] was concerned that if she took her sister to the doctor the pt would miss her ECT appointment.  Reassured [name] that was a good idea since she has had this pain since Friday and the antibiotic that Dr. Weber Cooks had ordered had not cleared it up.  [name] verbalized understanding and stated she would take her to doctor immediately.  Dr Weber Cooks informed of the conversation with [name].

## 2016-07-10 ENCOUNTER — Ambulatory Visit: Payer: Medicare Other | Admitting: Psychiatry

## 2016-07-11 ENCOUNTER — Encounter: Payer: Self-pay | Admitting: Anesthesiology

## 2016-07-11 ENCOUNTER — Ambulatory Visit
Admission: RE | Admit: 2016-07-11 | Discharge: 2016-07-11 | Disposition: A | Discharge status: Home / Self Care | Payer: Medicare Other | Source: Ambulatory Visit | Attending: Psychiatry | Admitting: Psychiatry

## 2016-07-11 ENCOUNTER — Other Ambulatory Visit: Payer: Self-pay | Admitting: Psychiatry

## 2016-07-11 DIAGNOSIS — E039 Hypothyroidism, unspecified: Secondary | ICD-10-CM | POA: Diagnosis not present

## 2016-07-11 DIAGNOSIS — E669 Obesity, unspecified: Secondary | ICD-10-CM | POA: Diagnosis not present

## 2016-07-11 DIAGNOSIS — K219 Gastro-esophageal reflux disease without esophagitis: Secondary | ICD-10-CM | POA: Diagnosis not present

## 2016-07-11 DIAGNOSIS — F332 Major depressive disorder, recurrent severe without psychotic features: Secondary | ICD-10-CM

## 2016-07-11 DIAGNOSIS — F329 Major depressive disorder, single episode, unspecified: Secondary | ICD-10-CM | POA: Diagnosis present

## 2016-07-11 DIAGNOSIS — F419 Anxiety disorder, unspecified: Secondary | ICD-10-CM | POA: Diagnosis not present

## 2016-07-11 MED ORDER — FENTANYL CITRATE (PF) 100 MCG/2ML IJ SOLN: 25.0000 ug | INTRAMUSCULAR | Status: DC | PRN

## 2016-07-11 MED ORDER — LABETALOL HCL 5 MG/ML IV SOLN
INTRAVENOUS | Status: AC
  Filled 2016-07-11: qty 4

## 2016-07-11 MED ORDER — METHOHEXITAL SODIUM 100 MG/10ML IV SOSY
PREFILLED_SYRINGE | INTRAVENOUS | Status: DC | PRN
  Administered 2016-07-11: 80 mg via INTRAVENOUS

## 2016-07-11 MED ORDER — LABETALOL HCL 5 MG/ML IV SOLN
INTRAVENOUS | Status: DC | PRN
  Administered 2016-07-11: 20 mg via INTRAVENOUS

## 2016-07-11 MED ORDER — SUCCINYLCHOLINE CHLORIDE 200 MG/10ML IV SOSY
PREFILLED_SYRINGE | INTRAVENOUS | Status: DC | PRN
  Administered 2016-07-11: 80 mg via INTRAVENOUS

## 2016-07-11 MED ORDER — SODIUM CHLORIDE 0.9 % IV SOLN
500.0000 mL | Freq: Once | INTRAVENOUS | Status: AC
  Administered 2016-07-11: 10:00:00 via INTRAVENOUS

## 2016-07-11 MED ORDER — SUCCINYLCHOLINE CHLORIDE 20 MG/ML IJ SOLN
INTRAMUSCULAR | Status: AC
  Filled 2016-07-11: qty 1

## 2016-07-11 MED ORDER — SODIUM CHLORIDE 0.9 % IV SOLN: 500.0000 mL | Freq: Once | INTRAVENOUS | Status: DC

## 2016-07-11 MED ORDER — SODIUM CHLORIDE 0.9 % IV SOLN
INTRAVENOUS | Status: DC | PRN
  Administered 2016-07-11 (×2): via INTRAVENOUS

## 2016-07-11 MED ORDER — ONDANSETRON HCL 4 MG/2ML IJ SOLN: 4.0000 mg | Freq: Once | INTRAMUSCULAR | Status: DC | PRN

## 2016-07-11 NOTE — Transfer of Care (Addendum)
Immediate Anesthesia Transfer of Care Note  Patient: Cassidy [name]  Procedure(s) Performed: ECT  Patient Location: PACU  Anesthesia Type:General  Level of Consciousness: sedated  Airway & Oxygen Therapy: Patient Spontanous Breathing and Patient connected to face mask oxygen  Post-op Assessment: Report given to RN and Post -op Vital signs reviewed and stable  Post vital signs: Reviewed and stable  Last Vitals:  Vitals:   07/11/16 1139 07/11/16 1151  BP: (!) 141/66   Pulse: 86 88  Resp: 20 18  Temp:      Last Pain:  Vitals:   07/11/16 1139  TempSrc:   PainSc: 0-No pain         Complications: No apparent anesthesia complications

## 2016-07-11 NOTE — Discharge Instructions (Signed)
1)  The drugs that you have been given will stay in your system until tomorrow so for the       next 24 hours you should not:  A. Drive an automobile  B. Make any legal decisions  C. Drink any alcoholic beverages  2)  You may resume your regular meals upon return home.  3)  A responsible adult must take you home.  Someone should stay with you for a few          hours, then be available by phone for the remainder of the treatment day.  4)  You May experience any of the following symptoms:  Headache, Nausea and a dry mouth (due to the medications you were given),  temporary memory loss and some confusion, or sore muscles (a warm bath  should help this).  If you you experience any of these symptoms let us know on                your return visit.  5)  Report any of the following: any acute discomfort, severe headache, or temperature        greater than 100.5 F.   Also report any unusual redness, swelling, drainage, or pain         at your IV site.    You may report Symptoms to:  [place] [facility]          Phone: [phone], ECT Department           or Dr. Prescott Gum office [phone]  6)  Your next ECT Treatment is Day Wednesday  Date April 4 at 815am  We will call 2 days prior to your scheduled appointment for arrival times.  7)  Nothing to eat or drink after midnight the night before your procedure.  8)  Take .     With a sip of water the morning of your procedure.  9)  Other Instructions: Call [phone] to cancel the morning of your procedure due         to illness or emergency.  10) We will call within 72 hours to assess how you are feeling.

## 2016-07-11 NOTE — Anesthesia Procedure Notes (Signed)
Date/Time: 07/11/2016 10:57 AM Performed by: Dionne Bucy Pre-anesthesia Checklist: Patient identified, Emergency Drugs available, Suction available and Patient being monitored Patient Re-evaluated:Patient Re-evaluated prior to inductionOxygen Delivery Method: Circle system utilized Preoxygenation: Pre-oxygenation with 100% oxygen Intubation Type: IV induction Ventilation: Mask ventilation without difficulty and Mask ventilation throughout procedure Airway Equipment and Method: Bite block Placement Confirmation: positive ETCO2 Dental Injury: Teeth and Oropharynx as per pre-operative assessment

## 2016-07-11 NOTE — Anesthesia Preprocedure Evaluation (Signed)
Anesthesia Evaluation  Patient identified by MRN, date of birth, ID band Patient awake    Reviewed: Allergy & Precautions, H&P , NPO status , Patient's Chart, lab work & pertinent test results, reviewed documented beta blocker date and time   Airway Mallampati: II   Neck ROM: full    Dental  (+) Poor Dentition   Pulmonary neg pulmonary ROS,    Pulmonary exam normal        Cardiovascular negative cardio ROS Normal cardiovascular exam Rhythm:regular Rate:Normal     Neuro/Psych PSYCHIATRIC DISORDERS negative neurological ROS  negative psych ROS   GI/Hepatic negative GI ROS, Neg liver ROS, GERD  Medicated,(+) Hepatitis -  Endo/Other  negative endocrine ROSHypothyroidism   Renal/GU negative Renal ROS  negative genitourinary   Musculoskeletal   Abdominal   Peds  Hematology negative hematology ROS (+) anemia ,   Anesthesia Other Findings Past Medical History: No date: Anxiety No date: Depression No date: Fatty liver No date: GERD (gastroesophageal reflux disease) No date: Hypothyroidism No date: Obesity No date: Thyroid disease Past Surgical History: No date: APPENDECTOMY 12/20/2014: ESOPHAGOGASTRODUODENOSCOPY (EGD) WITH PROPOFOL N/A     Comment: Procedure: ESOPHAGOGASTRODUODENOSCOPY (EGD)               WITH PROPOFOL;  Surgeon: Manya Silvas, MD;               Location: [place];  Service: Endoscopy;               Laterality: N/A; 12/20/2014: SAVORY DILATION N/A     Comment: Procedure: SAVORY DILATION;  Surgeon: Manya Silvas, MD;  Location: [facility] ENDOSCOPY;                Service: Endoscopy;  Laterality: N/A; No date: TONSILLECTOMY   Reproductive/Obstetrics negative OB ROS                             Anesthesia Physical Anesthesia Plan  ASA: III  Anesthesia Plan: General   Post-op Pain Management:    Induction:   Airway Management Planned:    Additional Equipment:   Intra-op Plan:   Post-operative Plan:   Informed Consent: I have reviewed the patients History and Physical, chart, labs and discussed the procedure including the risks, benefits and alternatives for the proposed anesthesia with the patient or authorized representative who has indicated his/her understanding and acceptance.   Dental Advisory Given  Plan Discussed with: CRNA  Anesthesia Plan Comments:         Anesthesia Quick Evaluation

## 2016-07-11 NOTE — Procedures (Signed)
ECT SERVICES Physician's Interval Evaluation & Treatment Note  Patient Identification: Cassidy [name] MRN:  [id number] Date of Evaluation:  07/11/2016 TX #: 7  MADRS:   MMSE:   P.E. Findings:  No change to physical exam except that her sinuses are no longer tender  Psychiatric Interval Note:  Mood is improved and stable  Subjective:  Patient is a 76 y.o. female seen for evaluation for Electroconvulsive Therapy. Mildly anxious  Treatment Summary:   [x]   Right Unilateral             []  Bilateral   % Energy : 0.3 ms 80%   Impedance: 1810 ohms  Seizure Energy Index: 6966 V squared  Postictal Suppression Index: 12%  Seizure Concordance Index: 94%  Medications  Pre Shock: Brevital 80 mg, succinylcholine 80 mg  Post Shock:    Seizure Duration: 13 seconds by EMG, 42 seconds by EEG   Comments: We will stop with the index course today and see her back on April 6 to assess for maintenance   Lungs:  [x]   Clear to auscultation               []  Other:   Heart:    [x]   Regular rhythm             []  irregular rhythm    [x]   Previous H&P reviewed, patient examined and there are NO CHANGES                 []   Previous H&P reviewed, patient examined and there are changes noted.   Alethia Berthold, MD 3/23/201810:50 AM

## 2016-07-11 NOTE — Anesthesia Post-op Follow-up Note (Cosign Needed)
Anesthesia QCDR form completed.        

## 2016-07-11 NOTE — H&P (Signed)
Cassidy [name] is an 76 y.o. female.   Chief Complaint: Feeling a little anxious mood is improved. Sinus pain is better. HPI: Patient with severe depression responding well to ECT and will be moving into maintenance phase.  Past Medical History:  Diagnosis Date  . Anxiety   . Depression   . Fatty liver   . GERD (gastroesophageal reflux disease)   . Hypothyroidism   . Obesity   . Thyroid disease     Past Surgical History:  Procedure Laterality Date  . APPENDECTOMY    . ESOPHAGOGASTRODUODENOSCOPY (EGD) WITH PROPOFOL N/A 12/20/2014   Procedure: ESOPHAGOGASTRODUODENOSCOPY (EGD) WITH PROPOFOL;  Surgeon: Manya Silvas, MD;  Location: [facility] ENDOSCOPY;  Service: Endoscopy;  Laterality: N/A;  . SAVORY DILATION N/A 12/20/2014   Procedure: SAVORY DILATION;  Surgeon: Manya Silvas, MD;  Location: [facility] ENDOSCOPY;  Service: Endoscopy;  Laterality: N/A;  . TONSILLECTOMY      Family History  Problem Relation Age of Onset  . Diabetes Mother   . COPD Mother   . Kidney disease Mother   . Depression Mother   . Heart attack Father   . Aneurysm Father   . Anxiety disorder Sister   . Depression Sister   . Diabetes Sister   . Hypertension Sister   . Atrial fibrillation Brother   . Spinal muscular atrophy Brother   . Breast cancer Sister   . Bone cancer Sister   . Depression Sister   . Anxiety disorder Sister   . Breast cancer Sister   . Colon cancer Sister   . Depression Sister   . Diabetes Sister   . COPD Sister   . Depression Sister   . Anxiety disorder Sister   . Diabetes Brother   . Depression Brother    Social History:  reports that she has never smoked. She has never used smokeless tobacco. She reports that she does not drink alcohol or use drugs.  Allergies:  Allergies  Allergen Reactions  . Sulfa Antibiotics Shortness Of Breath     (Not in a hospital admission)  No results found for this or any previous visit (from the past 48 hour(s)). No results found.  Review  of Systems  Constitutional: Negative.   HENT: Negative.   Eyes: Negative.   Respiratory: Negative.   Cardiovascular: Negative.   Gastrointestinal: Negative.   Musculoskeletal: Negative.   Skin: Negative.   Neurological: Negative.   Psychiatric/Behavioral: Negative.     Blood pressure (!) 158/65, pulse 93, temperature 99 F (37.2 C), temperature source Oral, resp. rate 18, height 5' 2"  (1.575 m), weight 79.8 kg (176 lb), SpO2 95 %. Physical Exam  Nursing note and vitals reviewed. Constitutional: She appears well-developed and well-nourished.  HENT:  Head: Normocephalic and atraumatic.  Eyes: Conjunctivae are normal. Pupils are equal, round, and reactive to light.  Neck: Normal range of motion.  Cardiovascular: Regular rhythm and normal heart sounds.   Respiratory: Effort normal and breath sounds normal. No respiratory distress.  GI: Soft.  Musculoskeletal: Normal range of motion.  Neurological: She is alert.  Skin: Skin is warm and dry.  Psychiatric: She has a normal mood and affect. Her behavior is normal. Judgment and thought content normal.     Assessment/Plan Treatment and then a break until April 6.  Alethia Berthold, MD 07/11/2016, 10:49 AM

## 2016-07-15 NOTE — Anesthesia Postprocedure Evaluation (Signed)
Anesthesia Post Note  Patient: Cassidy [name]  Procedure(s) Performed: * No procedures listed *  Patient location during evaluation: PACU Anesthesia Type: General Level of consciousness: awake and alert Pain management: pain level controlled Vital Signs Assessment: post-procedure vital signs reviewed and stable Respiratory status: spontaneous breathing, nonlabored ventilation, respiratory function stable and patient connected to nasal cannula oxygen Cardiovascular status: blood pressure returned to baseline and stable Postop Assessment: no signs of nausea or vomiting Anesthetic complications: no     Last Vitals:  Vitals:   07/11/16 1151 07/11/16 1155  BP:  (P) 124/67  Pulse: 88   Resp: 18   Temp:      Last Pain:  Vitals:   07/11/16 1139  TempSrc:   PainSc: 0-No pain                 Molli Barrows

## 2016-07-21 ENCOUNTER — Telehealth: Payer: Self-pay

## 2016-07-23 ENCOUNTER — Other Ambulatory Visit: Payer: Self-pay | Admitting: Psychiatry

## 2016-07-23 ENCOUNTER — Telehealth: Payer: Self-pay | Admitting: *Deleted

## 2016-07-23 ENCOUNTER — Telehealth: Payer: Self-pay

## 2016-08-15 ENCOUNTER — Other Ambulatory Visit: Payer: Self-pay | Admitting: Psychiatry

## 2016-08-23 ENCOUNTER — Other Ambulatory Visit: Payer: Self-pay | Admitting: Psychiatry

## 2016-09-23 ENCOUNTER — Ambulatory Visit: Payer: Medicare Other | Admitting: Psychiatry

## 2016-09-29 ENCOUNTER — Other Ambulatory Visit: Payer: Self-pay

## 2016-09-29 NOTE — Telephone Encounter (Signed)
called Cassidy [name] and Cassidy [name] wanted me to speak with her sister. Cassidy [name] sister looked at Cassidy [name] medication bottles and states that it has 2m on it.  they do not see any 270m   Since confirmed with sister about dosage. She would like to get at least a 10 day supply [facility] into [facility].

## 2016-09-29 NOTE — Telephone Encounter (Signed)
[facility] called back per dr. Ouida Sills order their rx for 71m take 1/2 tablet was deleted/discontinued. They will not fill that order . The pharmacy will wait on order for the 521mlexapro instead per dr. AnOuida Sillsrder.

## 2016-09-29 NOTE — Telephone Encounter (Signed)
[facility] called states that pt has a 55m lexapro take 1.2 tablet #90 with 3 refills from a dr. aOuida Sills and then there were still rx on file for the 563mfrom back in dec from dr. raEinar Gradpharmacy needs to know which is correct.

## 2016-09-30 NOTE — Telephone Encounter (Signed)
Please call in for lexapro 26m daily. 1 month supply

## 2016-10-01 ENCOUNTER — Ambulatory Visit: Payer: Medicare Other | Admitting: Psychiatry

## 2016-10-07 NOTE — Telephone Encounter (Signed)
This was done on on 10-06-16. Pharmacy called per dr. Einar Grad order . lexapro 62m

## 2016-10-08 ENCOUNTER — Encounter: Payer: Self-pay | Admitting: Psychiatry

## 2016-10-08 ENCOUNTER — Ambulatory Visit (INDEPENDENT_AMBULATORY_CARE_PROVIDER_SITE_OTHER): Payer: Medicare Other | Admitting: Psychiatry

## 2016-10-08 VITALS — BP 116/68 | HR 87 | Temp 98.5°F | Wt 155.8 lb

## 2016-10-08 DIAGNOSIS — F411 Generalized anxiety disorder: Secondary | ICD-10-CM

## 2016-10-08 DIAGNOSIS — F331 Major depressive disorder, recurrent, moderate: Secondary | ICD-10-CM

## 2016-10-08 MED ORDER — ESCITALOPRAM OXALATE 5 MG PO TABS: 5.0000 mg | ORAL_TABLET | Freq: Every day | ORAL | 3 refills | Status: DC

## 2016-10-08 MED ORDER — BUSPIRONE HCL 15 MG PO TABS: 15.0000 mg | ORAL_TABLET | Freq: Two times a day (BID) | ORAL | 2 refills | Status: DC

## 2016-10-08 MED ORDER — TRAZODONE HCL 100 MG PO TABS: 100.0000 mg | ORAL_TABLET | Freq: Every day | ORAL | 3 refills | Status: DC

## 2016-10-08 MED ORDER — QUETIAPINE FUMARATE 100 MG PO TABS: 100.0000 mg | ORAL_TABLET | Freq: Every day | ORAL | 2 refills | Status: DC

## 2016-10-08 NOTE — Telephone Encounter (Signed)
Pt was seen today (10-08-16)
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
## 2016-10-25 ENCOUNTER — Other Ambulatory Visit: Payer: Self-pay | Admitting: Psychiatry

## 2017-01-01 ENCOUNTER — Ambulatory Visit (INDEPENDENT_AMBULATORY_CARE_PROVIDER_SITE_OTHER): Payer: Medicare Other | Admitting: Psychiatry

## 2017-01-01 DIAGNOSIS — F411 Generalized anxiety disorder: Secondary | ICD-10-CM

## 2017-01-01 DIAGNOSIS — F331 Major depressive disorder, recurrent, moderate: Secondary | ICD-10-CM | POA: Diagnosis not present

## 2017-01-01 MED ORDER — BUSPIRONE HCL 15 MG PO TABS: 15.0000 mg | ORAL_TABLET | Freq: Every day | ORAL | 1 refills | Status: DC

## 2017-01-01 MED ORDER — QUETIAPINE FUMARATE 100 MG PO TABS: 100.0000 mg | ORAL_TABLET | Freq: Every day | ORAL | 2 refills | Status: DC

## 2017-01-01 MED ORDER — ESCITALOPRAM OXALATE 5 MG PO TABS: 5.0000 mg | ORAL_TABLET | Freq: Every day | ORAL | 3 refills | Status: DC

## 2017-01-01 MED ORDER — TRAZODONE HCL 100 MG PO TABS: 100.0000 mg | ORAL_TABLET | Freq: Every day | ORAL | 3 refills | Status: DC

## 2017-01-01 NOTE — Progress Notes (Signed)
Patient ID: Cassidy [name], female   DOB: 1940-12-19, 76 y.o.   MRN: [id number]   [facility] MD/PA/NP OP Progress Note  01/01/2017 10:05 AM Cassidy [name]  MRN:  [id number]  Subjective:  Patient returns for follow-up of her major depressive disorder, generalized anxiety disorder, panic disorder and insomnia. She was seen with her sister and daughter today. Sister and daughter are concerned that patient has not been eating well. However patient reports that she is doing well and is quiet happy. States that she is able to walk around in her apartment and enjoys watching TV. States she does not feel that hungry and have scrambled eggs in the morning a few times a week and then eat something at lunch or dinner. We discussed that sometimes appetite can reduce as a result of age and we will have her primary care physician do an assessment of her nutritional status. She denies any suicidal thoughts. States she sleeps okay. Patient also reports that she is taking the BuSpar 15 mg 1 time at night time and it seems to work well for her.  Chief Complaint: Doing well   Visit Diagnosis:     ICD-10-CM   1. Major depressive disorder, recurrent episode, moderate (HCC) F33.1   2. GAD (generalized anxiety disorder) F41.1     Past Medical History:  Past Medical History:  Diagnosis Date  . Anxiety   . Depression   . Fatty liver   . GERD (gastroesophageal reflux disease)   . Hypothyroidism   . Obesity   . Thyroid disease      Past Surgical History:  Procedure Laterality Date  . APPENDECTOMY    . ESOPHAGOGASTRODUODENOSCOPY (EGD) WITH PROPOFOL N/A 12/20/2014   Procedure: ESOPHAGOGASTRODUODENOSCOPY (EGD) WITH PROPOFOL;  Surgeon: Manya Silvas, MD;  Location[facility] ENDOSCOPY;  Service: Endoscopy;  Laterality: N/A;  . SAVORY DILATION N/A 12/20/2014   Procedure: SAVORY DILATION;  Surgeon: Manya Silvas, MD;  Location: [facility] ENDOSCOPY;  Service: Endoscopy;  Laterality: N/A;  . TONSILLECTOMY     Family History:   Family History  Problem Relation Age of Onset  . Diabetes Mother   . COPD Mother   . Kidney disease Mother   . Depression Mother   . Heart attack Father   . Aneurysm Father   . Anxiety disorder Sister   . Depression Sister   . Diabetes Sister   . Hypertension Sister   . Atrial fibrillation Brother   . Spinal muscular atrophy Brother   . Breast cancer Sister   . Bone cancer Sister   . Depression Sister   . Anxiety disorder Sister   . Breast cancer Sister   . Colon cancer Sister   . Depression Sister   . Diabetes Sister   . COPD Sister   . Depression Sister   . Anxiety disorder Sister   . Diabetes Brother   . Depression Brother    Social History:  Social History   Social History  . Marital status: Widowed    Spouse name: N/A  . Number of children: N/A  . Years of education: N/A   Social History Main Topics  . Smoking status: Never Smoker  . Smokeless tobacco: Never Used  . Alcohol use No  . Drug use: No  . Sexual activity: Not Currently    Birth control/ protection: Post-menopausal   Other Topics Concern  . Not on file   Social History Narrative  . No narrative on file   Additional History:  Assessment:   Musculoskeletal: Strength & Muscle Tone: decreased Gait & Station: In a wheelchair today  Patient leans: N/A  Psychiatric Specialty Exam: Medication Refill     Review of Systems  Psychiatric/Behavioral: Positive for memory loss. Negative for depression, hallucinations, substance abuse and suicidal ideas. The patient is not nervous/anxious and does not have insomnia.   All other systems reviewed and are negative.   There were no vitals taken for this visit.There is no height or weight on file to calculate BMI.  General Appearance: Well Groomed  Eye Contact:  Good  Speech:  Patient quite interactive today.   Volume:  Normal  Mood: good  Affect:  Pleasant   Thought Process:  Linear  Orientation:  Full (Time, Place, and Person)  Thought  Content:  Negative  Suicidal Thoughts:  No  Homicidal Thoughts:  No  Memory:  Immediate;   Good Recent;   Good Remote;   Good  Judgement: Fair   Insight:  Fair   Psychomotor Activity:  Negative  Concentration:  Good  Recall:  Good  Fund of Knowledge: Good  Language: Good  Akathisia:  Negative  Handed:  Right unknown   AIMS (if indicated):   Done today, normal, no stiffness noted  Assets:  Communication Skills Desire for Improvement Social Support  ADL's:  Intact  Cognition: WNL  Sleep:  good   Is the patient at risk to self?  No. Has the patient been a risk to self in the past 6 months?  No. Has the patient been a risk to self within the distant past?  No. Is the patient a risk to others?  No. Has the patient been a risk to others in the past 6 months?  No. Has the patient been a risk to others within the distant past?  No.  Current Medications: Current Outpatient Prescriptions  Medication Sig Dispense Refill  . ampicillin (PRINCIPEN) 500 MG capsule Take 1 capsule (500 mg total) by mouth every 8 (eight) hours. 21 capsule 0  . busPIRone (BUSPAR) 15 MG tablet Take 1 tablet (15 mg total) by mouth 2 (two) times daily. 60 tablet 2  . escitalopram (LEXAPRO) 5 MG tablet Take 1 tablet (5 mg total) by mouth at bedtime. 30 tablet 3  . fluticasone (FLONASE) 50 MCG/ACT nasal spray Place 2 sprays into both nostrils daily. 16 g 2  . levofloxacin (LEVAQUIN) 500 MG tablet     . levothyroxine (SYNTHROID, LEVOTHROID) 75 MCG tablet Take 1 tablet (75 mcg total) by mouth daily before breakfast. 30 tablet 0  . omeprazole (PRILOSEC) 20 MG capsule Take 20 mg by mouth 2 (two) times daily.    . Potassium Chloride (KLOR-CON 10 PO) Take by mouth.    . QUEtiapine (SEROQUEL) 100 MG tablet Take 1 tablet (100 mg total) by mouth at bedtime. 30 tablet 2  . traZODone (DESYREL) 100 MG tablet Take 1 tablet (100 mg total) by mouth at bedtime. 30 tablet 3   No current facility-administered medications for this  visit.     Medical Decision Making:  Review of Medication Regimen & Side Effects (2)  Treatment Plan Summary:Medication management and Plan   Major depressive disorder In remission  Current medications include Seroquel at 129m once daily  Continue Lexapro at 5 mg daily BuSpar 15 mg Once daily since this is how patient has been taking it. Trazodone to 100 mg at bedtime  Recommend a physical and nutritional assessment by primary care physician. Patient will follow-up with Dr.Eappen at  this clinic for future appointments and she is okay with it. She is very thankful for the help she received from this clinician. Return to clinic in 2 months time or call before if needed.      Patric Buckhalter 01/01/2017, 10:05 AM

## 2017-01-08 ENCOUNTER — Ambulatory Visit: Payer: Medicare Other | Admitting: Psychiatry

## 2017-01-19 DIAGNOSIS — R634 Abnormal weight loss: Secondary | ICD-10-CM | POA: Insufficient documentation

## 2017-02-02 ENCOUNTER — Other Ambulatory Visit
Admission: RE | Admit: 2017-02-02 | Discharge: 2017-02-02 | Disposition: A | Discharge status: Home / Self Care | Payer: Medicare Other | Source: Ambulatory Visit | Attending: Internal Medicine | Admitting: Internal Medicine

## 2017-02-02 DIAGNOSIS — K7290 Hepatic failure, unspecified without coma: Secondary | ICD-10-CM | POA: Diagnosis present

## 2017-02-02 LAB — AMMONIA: AMMONIA: 38 umol/L — AB (ref 9–35)

## 2017-02-12 ENCOUNTER — Other Ambulatory Visit: Payer: Self-pay | Admitting: Psychiatry

## 2017-03-03 ENCOUNTER — Ambulatory Visit (INDEPENDENT_AMBULATORY_CARE_PROVIDER_SITE_OTHER): Payer: Medicare Other | Admitting: Psychiatry

## 2017-03-03 ENCOUNTER — Encounter: Payer: Self-pay | Admitting: Psychiatry

## 2017-03-03 VITALS — BP 128/72 | HR 76 | Ht 60.0 in | Wt 155.0 lb

## 2017-03-03 DIAGNOSIS — F5105 Insomnia due to other mental disorder: Secondary | ICD-10-CM

## 2017-03-03 DIAGNOSIS — F330 Major depressive disorder, recurrent, mild: Secondary | ICD-10-CM

## 2017-03-03 DIAGNOSIS — F411 Generalized anxiety disorder: Secondary | ICD-10-CM | POA: Diagnosis not present

## 2017-03-03 MED ORDER — ESCITALOPRAM OXALATE 5 MG PO TABS: 5.0000 mg | ORAL_TABLET | Freq: Every day | ORAL | 3 refills | Status: DC

## 2017-03-03 MED ORDER — TEMAZEPAM 7.5 MG PO CAPS: 7.5000 mg | ORAL_CAPSULE | Freq: Every evening | ORAL | 1 refills | Status: DC | PRN

## 2017-03-03 MED ORDER — QUETIAPINE FUMARATE 100 MG PO TABS: 100.0000 mg | ORAL_TABLET | Freq: Every day | ORAL | 3 refills | Status: DC

## 2017-03-03 MED ORDER — TRAZODONE HCL 100 MG PO TABS: 150.0000 mg | ORAL_TABLET | Freq: Every day | ORAL | 3 refills | Status: DC

## 2017-03-03 MED ORDER — BUSPIRONE HCL 15 MG PO TABS: 15.0000 mg | ORAL_TABLET | Freq: Every day | ORAL | 3 refills | Status: DC

## 2017-03-03 NOTE — Progress Notes (Signed)
[facility] MD OP Progress Note  03/03/2017 1:28 PM Cassidy Cassidy [name]  MRN:  [id number]  Chief Complaint: ' I am ok.'  HPI: Cassidy Cassidy [name] is a 76 year old Caucasian female who has a history of major depressive disorder, GAD, panic disorder and insomnia, lives in [place] along with her grandson Cassidy Cassidy [name], who presented to the clinic today for a follow-up visit.  Cassidy Cassidy [name] as well as her grandson Cassidy Cassidy [name],  participated in the evaluation today. Patient reports that she has been doing okay on her medications.  She continues to have some sleep issues on and off, and was prescribed Ativan by 1 of her providers.  However per her grandson when she took the Ativan she became more hyperactive, confused and did not sleep at all.  Hence she was not given the medication anymore.Per grand son , pt has some nights - 1-2 times per 1-2 months when she cannot sleep at all and stays up all night.  Patient continues to have her moments, some days she stays in bed and is not motivated, and there are some days when she is doing well, and seems more brighter.  Her family continues to make sure that she eats at least 3 meals a day, and she has been doing so on a regular basis.   Her grandson remains supportive, make sure that she takes her medications correctly.  Denies any adverse effects to any of the medications at this time.  She denies suicidality or psychosis.  Per her grandson she does have some memory issues, like keeping up with her appointments and things like that.  She was recently started on a medication called Rifaximin 550 mg p.o. for high ammonia level.  She is supposed to take it on a regular basis and follow-up with her primary medical doctor in March 2019.  Based on writer's examination today, she seems Cassidy [name], oriented, was able to give details about her personal information, as well as new the time place and was also oriented to the situation.  She could also give me details about the medications that she is on and was able to  tell me the names.  Hence at this time according to my examination cognitively she is doing fair.  However discussed with patient and her grandson that I will request some blood work including vitamin B12, vitamin D to make sure that they are within normal limits.  Her TSH is being monitored by her PMD, and the most recent one as per EHR is within normal limits.  She continues to take levothyroxine on a regular basis.   Visit Diagnosis:    ICD-10-CM   1. MDD (major depressive disorder), recurrent episode, mild (HCC) F33.0 escitalopram (LEXAPRO) 5 MG tablet    busPIRone (BUSPAR) 15 MG tablet    QUEtiapine (SEROQUEL) 100 MG tablet    traZODone (DESYREL) 100 MG tablet    temazepam (RESTORIL) 7.5 MG capsule  2. GAD (generalized anxiety disorder) F41.1   3. Insomnia due to mental disorder F51.05     Past Psychiatric History: Hx of depression , insomnia, anxiety sx. Has been following up with [facility] since the past several years. She also had ECT treatments done in the past, without much benefit.   Past Medical History:  Past Medical History:  Diagnosis Date  . Anxiety   . Depression   . Fatty liver   . GERD (gastroesophageal reflux disease)   . Hypothyroidism   . Obesity   . Thyroid disease     Past Surgical History:  Procedure Laterality Date  . APPENDECTOMY    . TONSILLECTOMY      Family Psychiatric History: positive for depression and anxiety in her family as noted below.   Family History:  Family History  Problem Relation Age of Onset  . Diabetes Mother   . COPD Mother   . Kidney disease Mother   . Depression Mother   . Heart attack Father   . Aneurysm Father   . Anxiety disorder Sister   . Depression Sister   . Diabetes Sister   . Hypertension Sister   . Atrial fibrillation Brother   . Spinal muscular atrophy Brother   . Breast cancer Sister   . Bone cancer Sister   . Depression Sister   . Anxiety disorder Sister   . Breast cancer Sister   . Colon cancer Sister    . Depression Sister   . Diabetes Sister   . COPD Sister   . Depression Sister   . Anxiety disorder Sister   . Diabetes Brother   . Depression Brother     Social History: she lives with her grand son and one of her daughters in [place]. Has good social support from them.  Social History   Socioeconomic History  . Marital status: Widowed    Spouse name: None  . Number of children: None  . Years of education: None  . Highest education level: None  Social Needs  . Financial resource strain: None  . Food insecurity - worry: None  . Food insecurity - inability: None  . Transportation needs - medical: None  . Transportation needs - non-medical: None  Occupational History  . None  Tobacco Use  . Smoking status: Never Smoker  . Smokeless tobacco: Never Used  Substance and Sexual Activity  . Alcohol use: No  . Drug use: No  . Sexual activity: Not Currently    Birth control/protection: Post-menopausal  Other Topics Concern  . None  Social History Narrative  . None    Allergies:  Allergies  Allergen Reactions  . Sulfa Antibiotics Shortness Of Breath    Metabolic Disorder Labs: Lab Results  Component Value Date   HGBA1C 5.3 06/21/2016   MPG 105 06/21/2016   No results found for: PROLACTIN Lab Results  Component Value Date   CHOL 145 06/21/2016   TRIG 51 06/21/2016   HDL 44 06/21/2016   CHOLHDL 3.3 06/21/2016   VLDL 10 06/21/2016   LDLCALC 91 06/21/2016   LDLCALC 70 08/22/2013   Lab Results  Component Value Date   TSH 2.195 06/21/2016    Therapeutic Level Labs: No results found for: LITHIUM No results found for: VALPROATE No components found for:  CBMZ  Current Medications: Current Outpatient Medications  Medication Sig Dispense Refill  . busPIRone (BUSPAR) 15 MG tablet Take 1 tablet (15 mg total) at bedtime by mouth. 30 tablet 3  . escitalopram (LEXAPRO) 5 MG tablet Take 1 tablet (5 mg total) at bedtime by mouth. 30 tablet 3  . fluticasone  (FLONASE) 50 MCG/ACT nasal spray Place 2 sprays into both nostrils daily. 16 g 2  . levothyroxine (SYNTHROID, LEVOTHROID) 75 MCG tablet Take 1 tablet (75 mcg total) by mouth daily before breakfast. 30 tablet 0  . omeprazole (PRILOSEC) 20 MG capsule Take 20 mg by mouth 2 (two) times daily.    . Potassium Chloride (KLOR-CON 10 PO) Take by mouth.    . QUEtiapine (SEROQUEL) 100 MG tablet Take 1 tablet (100 mg total) at bedtime by mouth.  30 tablet 3  . ampicillin (PRINCIPEN) 500 MG capsule Take 1 capsule (500 mg total) by mouth every 8 (eight) hours. (Patient not taking: Reported on 03/03/2017) 21 capsule 0  . temazepam (RESTORIL) 7.5 MG capsule Take 1 capsule (7.5 mg total) at bedtime as needed by mouth for sleep (ONLY 1-2 TIMES A MONTH WHEN SHE HAS SEVERE SLEEP ISSUES). 15 capsule 1  . traZODone (DESYREL) 100 MG tablet Take 1.5 tablets (150 mg total) at bedtime by mouth. 45 tablet 3   No current facility-administered medications for this visit.      Musculoskeletal: Strength & Muscle Tone: within normal limits Gait & Station: wheel chair bound Patient leans: N/A  Psychiatric Specialty Exam: Review of Systems  Psychiatric/Behavioral: The patient has insomnia.   All other systems reviewed and are negative.   Blood pressure 128/72, pulse 76, height 5' (1.524 m), weight 155 lb (70.3 kg).Body mass index is 30.27 kg/m.  General Appearance: Casual  Eye Contact:  Fair  Speech:  Normal Rate  Volume:  Normal  Mood:  Dysphoric  Affect:  Congruent  Thought Process:  Goal Directed and Descriptions of Associations: Intact  Orientation:  Full (Time, Place, and Person)  Thought Content: Logical   Suicidal Thoughts:  No  Homicidal Thoughts:  No  Memory:  Immediate;   Fair Recent;   Fair Remote;   Fair  Judgement:  Fair  Insight:  Fair  Psychomotor Activity:  Normal  Concentration:  Concentration: Fair and Attention Span: Fair  Recall:  [employer] of Knowledge: Fair  Language: Fair   Akathisia:  No  Handed:  Right  AIMS (if indicated): denies tremors, rigidty  Assets:  Communication Skills Desire for Improvement Housing Social Support  ADL's:  Intact  Cognition: WNL  Sleep:  Fair   Screenings: AUDIT     Admission (Discharged) from 06/20/2016 in [facility]  Alcohol Use Disorder Identification Test Final Score (AUDIT)  0    ECT-MADRS     ECT Treatment from 07/11/2016 in [place] Admission (Discharged) from 06/20/2016 in [facility] [place] Total Score  16  16    Mini-Mental     ECT Treatment from 07/11/2016 in [place] Admission (Discharged) from 06/20/2016 in [facility]  Total Score (max 30 points )  30  28       Assessment and Plan: Ellisyn is a 76 year old Caucasian female who has a history of depression, anxiety, insomnia as well as some on and off cognitive problems, is here for a follow-up visit today.  She is overall doing well and has good social support from her family.  Her medical problems are currently being managed by her primary medical doctor.  Discussed medication changes as noted below.  Plan For depression Lexapro 5 mg p.o. daily Seroquel 100 mg p.o. qhs.  For anxiety BuSpar 15 mg p.o. Nightly   For insomnia Trazodone 150 mg p.o. Nightly Seroquel 100 mg p.o. Nightly Add Restoril 7.5 mg p.o. nightly as needed, only to take 2-3 times per month, the nights that she has severe trouble sleeping. Discussed risk of being on benzodiazepine therapy.  Discussed that a lot of  factors could be contributing to to her cognitive issues on and off which includes polypharmacy,  age, hypothyroidism, increased ammonia level , history of ECT and so on. Will continue to monitor closely.Provided lab slips to get vitamin B12, vitamin D.   Provided  medication education  Follow-up in 3 months or sooner if needed  More than  50 % of the time was spent for psychoeducation and supportive psychotherapy and care coordination.  This note was generated in part or whole with voice recognition software. Voice recognition is usually quite accurate but there are transcription errors that can and very often do occur. I apologize for any typographical errors that were not detected and corrected.       Cassidy Alert, MD 03/03/2017, 1:28 PM

## 2017-03-03 NOTE — Patient Instructions (Signed)
Please stop taking Ativan PRN since she became more confused when she took it.

## 2017-05-28 ENCOUNTER — Emergency Department
Admission: EM | Admit: 2017-05-28 | Discharge: 2017-05-29 | Disposition: A | Discharge status: Home / Self Care | Payer: Medicare Other | Attending: Emergency Medicine | Admitting: Emergency Medicine

## 2017-05-28 ENCOUNTER — Other Ambulatory Visit: Payer: Self-pay | Admitting: Psychiatry

## 2017-05-28 ENCOUNTER — Telehealth: Payer: Self-pay

## 2017-05-28 ENCOUNTER — Emergency Department: Payer: Medicare Other

## 2017-05-28 ENCOUNTER — Other Ambulatory Visit: Payer: Self-pay

## 2017-05-28 DIAGNOSIS — S0990XA Unspecified injury of head, initial encounter: Secondary | ICD-10-CM | POA: Insufficient documentation

## 2017-05-28 DIAGNOSIS — S0181XA Laceration without foreign body of other part of head, initial encounter: Secondary | ICD-10-CM

## 2017-05-28 DIAGNOSIS — W010XXA Fall on same level from slipping, tripping and stumbling without subsequent striking against object, initial encounter: Secondary | ICD-10-CM | POA: Insufficient documentation

## 2017-05-28 DIAGNOSIS — Y999 Unspecified external cause status: Secondary | ICD-10-CM | POA: Insufficient documentation

## 2017-05-28 DIAGNOSIS — F330 Major depressive disorder, recurrent, mild: Secondary | ICD-10-CM

## 2017-05-28 DIAGNOSIS — Y929 Unspecified place or not applicable: Secondary | ICD-10-CM | POA: Diagnosis not present

## 2017-05-28 DIAGNOSIS — Z23 Encounter for immunization: Secondary | ICD-10-CM | POA: Diagnosis not present

## 2017-05-28 DIAGNOSIS — E039 Hypothyroidism, unspecified: Secondary | ICD-10-CM | POA: Insufficient documentation

## 2017-05-28 DIAGNOSIS — Y9389 Activity, other specified: Secondary | ICD-10-CM | POA: Insufficient documentation

## 2017-05-28 DIAGNOSIS — Z79899 Other long term (current) drug therapy: Secondary | ICD-10-CM | POA: Diagnosis not present

## 2017-05-28 LAB — COMPREHENSIVE METABOLIC PANEL
ALT: 11 U/L — AB (ref 14–54)
AST: 29 U/L (ref 15–41)
Albumin: 3 g/dL — ABNORMAL LOW (ref 3.5–5.0)
Alkaline Phosphatase: 108 U/L (ref 38–126)
Anion gap: 6 (ref 5–15)
BUN: 10 mg/dL (ref 6–20)
CHLORIDE: 108 mmol/L (ref 101–111)
CO2: 25 mmol/L (ref 22–32)
CREATININE: 0.81 mg/dL (ref 0.44–1.00)
Calcium: 8.5 mg/dL — ABNORMAL LOW (ref 8.9–10.3)
GFR calc Af Amer: >60 mL/min (ref >60)
Glucose, Bld: 119 mg/dL — ABNORMAL HIGH (ref 65–99)
POTASSIUM: 4.1 mmol/L (ref 3.5–5.1)
Sodium: 139 mmol/L (ref 135–145)
Total Bilirubin: 1.1 mg/dL (ref 0.3–1.2)
Total Protein: 6.1 g/dL — ABNORMAL LOW (ref 6.5–8.1)

## 2017-05-28 LAB — CBC WITH DIFFERENTIAL/PLATELET
BASOS ABS: 0 10*3/uL (ref 0–0.1)
Basophils Relative: 1 %
EOS PCT: 3 %
Eosinophils Absolute: 0.1 10*3/uL (ref 0–0.7)
HCT: 36.6 % (ref 35.0–47.0)
Hemoglobin: 11.8 g/dL — ABNORMAL LOW (ref 12.0–16.0)
LYMPHS ABS: 0.6 10*3/uL — AB (ref 1.0–3.6)
LYMPHS PCT: 34 %
MCH: 27.7 pg (ref 26.0–34.0)
MCHC: 32.2 g/dL (ref 32.0–36.0)
MCV: 85.9 fL (ref 80.0–100.0)
MONO ABS: 0.2 10*3/uL (ref 0.2–0.9)
Monocytes Relative: 11 %
NEUTROS ABS: 0.9 10*3/uL — AB (ref 1.4–6.5)
Neutrophils Relative %: 51 %
Platelets: 58 10*3/uL — ABNORMAL LOW (ref 150–440)
RBC: 4.26 MIL/uL (ref 3.80–5.20)
RDW: 19.3 % — ABNORMAL HIGH (ref 11.5–14.5)
WBC: 1.8 10*3/uL — AB (ref 3.6–11.0)

## 2017-05-28 LAB — TROPONIN I

## 2017-05-28 MED ORDER — TETANUS-DIPHTH-ACELL PERTUSSIS 5-2.5-18.5 LF-MCG/0.5 IM SUSP
0.5000 mL | Freq: Once | INTRAMUSCULAR | Status: AC
  Administered 2017-05-28: 0.5 mL via INTRAMUSCULAR
  Filled 2017-05-28: qty 0.5

## 2017-05-28 MED ORDER — LIDOCAINE-EPINEPHRINE 1 %-1:100000 IJ SOLN: 10.0000 mL | Freq: Once | INTRAMUSCULAR | Status: DC

## 2017-05-28 NOTE — ED Notes (Signed)
Patient transported to CT 

## 2017-05-28 NOTE — ED Provider Notes (Signed)
[facility] Emergency Department Provider Note  ____________________________________________   First MD Initiated Contact with Patient 05/28/17 2301     (approximate)  I have reviewed the triage vital signs and the nursing notes.   HISTORY  Chief Complaint Fall and Head Injury   HPI Cassidy [name] is a 77 y.o. female who comes to the emergency department via EMS with left facial trauma.  She said that she took her nighttime medications and went to lie down on her couch and she "missed" the couch and fell onto the left side of her face.  Her last tetanus is unknown.  He has no pain currently.  She did have sudden onset moderate severity left facial pain.  Worse with falling and improved with time.  She denies antecedent chest pain or shortness of breath.  She denies double vision or blurred vision.  She denies chest pain or shortness of breath.  Past Medical History:  Diagnosis Date  . Anxiety   . Depression   . Fatty liver   . GERD (gastroesophageal reflux disease)   . Hypothyroidism   . Obesity   . Thyroid disease     Patient Active Problem List   Diagnosis Date Noted  . Severe recurrent major depression without psychotic features ([place]) 06/19/2016  . Hepatic encephalopathy ([place]) 08/13/2015  . Thrombocytopenia ([place]) 07/23/2015  . Amnesia 03/20/2015  . Major depression in remission ([place]) 03/12/2015  . Gonalgia 01/05/2015  . Absolute anemia 11/27/2014  . Arthritis 11/27/2014  . Acid reflux 11/27/2014  . Depression, major, recurrent, in partial remission ([place]) 11/27/2014  . Encounter for general adult medical examination without abnormal findings 08/25/2014  . Chronic LBP 08/15/2014  . Complete rotator cuff rupture of left shoulder 05/01/2014  . Infraspinatus tenosynovitis 05/01/2014  . Other synovitis and tenosynovitis, right shoulder 05/01/2014  . Difficulty in walking 11/30/2013  . Extreme obesity 11/30/2013  . Morbid obesity ([place]) 11/30/2013    . Arthritis, degenerative 10/01/2013  . Steatohepatitis 10/01/2013  . Orthostasis 10/01/2013  . 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 Fall 09/26/2013  . Dizziness 09/07/2013  . Osteoporosis with fracture 09/06/2013  . Clinical depression 03/25/2012  . Adult hypothyroidism 03/25/2012  . Esophageal stenosis 07/03/2009  . Back ache 03/08/2003  . Anxiety state 12/27/2002    Past Surgical History:  Procedure Laterality Date  . APPENDECTOMY    . ESOPHAGOGASTRODUODENOSCOPY (EGD) WITH PROPOFOL N/A 12/20/2014   Procedure: ESOPHAGOGASTRODUODENOSCOPY (EGD) WITH PROPOFOL;  Surgeon: Manya Silvas, MD;  Location: [facility] ENDOSCOPY;  Service: Endoscopy;  Laterality: N/A;  . SAVORY DILATION N/A 12/20/2014   Procedure: SAVORY DILATION;  Surgeon: Manya Silvas, MD;  Location: [facility] ENDOSCOPY;  Service: Endoscopy;  Laterality: N/A;  . TONSILLECTOMY      Prior to Admission medications   Medication Sig Start Date End Date Taking? Authorizing Provider  ampicillin (PRINCIPEN) 500 MG capsule Take 1 capsule (500 mg total) by mouth every 8 (eight) hours. Patient not taking: Reported on 03/03/2017 07/04/16   Clapacs, Madie Reno, MD  busPIRone (BUSPAR) 15 MG tablet Take 1 tablet (15 mg total) at bedtime by mouth. 03/03/17   Ursula Alert, MD  escitalopram (LEXAPRO) 5 MG tablet Take 1 tablet (5 mg total) at bedtime by mouth. 03/03/17   Eappen, Ria Clock, MD  fluticasone (FLONASE) 50 MCG/ACT nasal spray Place 2 sprays into both nostrils daily. 07/04/16   Clapacs, Madie Reno, MD  levothyroxine (SYNTHROID, LEVOTHROID) 75 MCG tablet Take 1 tablet (75 mcg total) by mouth daily before  breakfast. 07/04/16   Clapacs, Madie Reno, MD  omeprazole (PRILOSEC) 20 MG capsule Take 20 mg by mouth 2 (two) times daily.    [provider]  Potassium Chloride (KLOR-CON 10 PO) Take by mouth.    [provider]  QUEtiapine (SEROQUEL) 100 MG tablet Take 1 tablet (100 mg total) at bedtime by mouth. 03/03/17   Eappen, Saramma, MD   temazepam (RESTORIL) 7.5 MG capsule Take 1 capsule (7.5 mg total) at bedtime as needed by mouth for sleep (ONLY 1-2 TIMES A MONTH WHEN SHE HAS SEVERE SLEEP ISSUES). 03/03/17   Ursula Alert, MD  traZODone (DESYREL) 100 MG tablet Take 1.5 tablets (150 mg total) at bedtime by mouth. 03/03/17   Ursula Alert, MD    Allergies Sulfa antibiotics  Family History  Problem Relation Age of Onset  . Diabetes Mother   . COPD Mother   . Kidney disease Mother   . Depression Mother   . Heart attack Father   . Aneurysm Father   . Anxiety disorder Sister   . Depression Sister   . Diabetes Sister   . Hypertension Sister   . Atrial fibrillation Brother   . Spinal muscular atrophy Brother   . Breast cancer Sister   . Bone cancer Sister   . Depression Sister   . Anxiety disorder Sister   . Breast cancer Sister   . Colon cancer Sister   . Depression Sister   . Diabetes Sister   . COPD Sister   . Depression Sister   . Anxiety disorder Sister   . Diabetes Brother   . Depression Brother     Social History Social History   Tobacco Use  . Smoking status: Never Smoker  . Smokeless tobacco: Never Used  Substance Use Topics  . Alcohol use: No  . Drug use: No    Review of Systems Constitutional: No fever/chills Eyes: No visual changes. ENT: No sore throat. Cardiovascular: Denies chest pain. Respiratory: Denies shortness of breath. Gastrointestinal: No abdominal pain.  No nausea, no vomiting.  No diarrhea.  No constipation. Genitourinary: Negative for dysuria. Musculoskeletal: Negative for back pain. Skin: Positive for wound Neurological: Positive for headache   ____________________________________________   PHYSICAL EXAM:  VITAL SIGNS: ED Triage Vitals  Enc Vitals Group     BP      Pulse      Resp      Temp      Temp src      SpO2      Weight      Height      Head Circumference      Peak Flow      Pain Score      Pain Loc      Pain Edu?      Excl. in [place]?      Constitutional: Alert and oriented x4 joking laughing well-appearing nontoxic no diaphoresis speaks in full clear sentences Eyes: PERRL EOMI. mid range and brisk Head: 2 cm superficial laceration just above left brow. Nose: No congestion/rhinnorhea. Mouth/Throat: No trismus Neck: No stridor.  No midline tenderness or step-offs Cardiovascular: Normal rate, regular rhythm. Grossly normal heart sounds.  Good peripheral circulation. Respiratory: Normal respiratory effort.  No retractions. Lungs CTAB and moving good air Gastrointestinal: Soft nontender Musculoskeletal: No lower extremity edema   Neurologic:  Normal speech and language. No gross focal neurologic deficits are appreciated. Skin:  Skin is warm, dry and intact. No rash noted. Psychiatric: Mood and affect are normal. Speech  and behavior are normal.    ____________________________________________   DIFFERENTIAL includes but not limited to  Cardiogenic syncope, vasovagal syncope, dehydration, mechanical fall, laceration, intracerebral hemorrhage, cervical spine fracture ____________________________________________   LABS (all labs ordered are listed, but only abnormal results are displayed)  Labs Reviewed  COMPREHENSIVE METABOLIC PANEL - Abnormal; Notable for the following components:      Result Value   Glucose, Bld 119 (*)    Calcium 8.5 (*)    Total Protein 6.1 (*)    Albumin 3.0 (*)    ALT 11 (*)    All other components within normal limits  CBC WITH DIFFERENTIAL/PLATELET - Abnormal; Notable for the following components:   WBC 1.8 (*)    Hemoglobin 11.8 (*)    RDW 19.3 (*)    Platelets 58 (*)    Neutro Abs 0.9 (*)    Lymphs Abs 0.6 (*)    All other components within normal limits  URINALYSIS, COMPLETE (UACMP) WITH MICROSCOPIC - Abnormal; Notable for the following components:   Color, Urine YELLOW (*)    APPearance CLEAR (*)    All other components within normal limits  TROPONIN I    Lab work reviewed by  me with chronic pancytopenia __________________________________________  EKG  ED ECG REPORT I, Darel Hong, the attending physician, personally viewed and interpreted this ECG.  Date: 05/28/2017 EKG Time:  Rate: 76 Rhythm: normal sinus rhythm QRS Axis: normal Intervals: normal ST/T Wave abnormalities: normal Narrative Interpretation: no evidence of acute ischemia  ____________________________________________  RADIOLOGY  Head CT reviewed by me with no acute disease ____________________________________________   PROCEDURES  Procedure(s) performed: Yes  .Marland KitchenLaceration Repair Date/Time: 05/29/2017 1:15 AM Performed by: Darel Hong, MD Authorized by: Darel Hong, MD   Consent:    Consent obtained:  Verbal   Consent given by:  Patient   Risks discussed:  Infection, pain, retained foreign body, poor cosmetic result and poor wound healing Anesthesia (see MAR for exact dosages):    Anesthesia method:  Local infiltration   Local anesthetic:  Lidocaine 1% WITH epi Laceration details:    Location:  Face   Length (cm):  3 Repair type:    Repair type:  Intermediate Pre-procedure details:    Preparation:  Patient was prepped and draped in usual sterile fashion Exploration:    Hemostasis achieved with:  Direct pressure   Wound exploration: entire depth of wound probed and visualized     Contaminated: no   Treatment:    Area cleansed with:  Saline   Amount of cleaning:  Extensive   Irrigation solution:  Sterile saline   Visualized foreign bodies/material removed: no   Skin repair:    Repair method:  Sutures   Suture size:  6-0   Suture material:  Nylon   Number of sutures:  4 Approximation:    Approximation:  Close Post-procedure details:    Dressing:  Sterile dressing   Patient tolerance of procedure:  Tolerated well, no immediate complications Comments:     Wound was closed in 2 layers.  First I placed one single 6-0 Vicryl suture in the deep dermal layer  and then approximated the superficial layer with 3 6-0 nylon sutures with good cosmesis     Critical Care performed: no  Observation: no ____________________________________________   INITIAL IMPRESSION / ASSESSMENT AND PLAN / ED COURSE  Pertinent labs & imaging results that were available during my care of the patient were reviewed by me and considered in my medical decision making (  see chart for details).  The patient arrives neurologically intact.  She gives a clear history for a mechanical fall.  She has obvious left facial trauma.  Tetanus will be updated.  Head CT and basic blood work are pending.  Fortunately the patient's imaging is negative for acute intracerebral pathology.  Wound was washed out and closed in 2 layers with good cosmesis.  Strict return precautions have been given and the patient verbalizes understanding and agreement with the plan.      ____________________________________________   FINAL CLINICAL IMPRESSION(S) / ED DIAGNOSES  Final diagnoses:  Injury of head, initial encounter  Facial laceration, initial encounter      NEW MEDICATIONS STARTED DURING THIS VISIT:  Discharge Medication List as of 05/29/2017  1:15 AM       Note:  This document was prepared using Dragon voice recognition software and may include unintentional dictation errors.     Darel Hong, MD 05/29/17 [phone]

## 2017-05-28 NOTE — Telephone Encounter (Signed)
recevied a fax requesting a refill on the temazepam 7.5 mg pt last seen on 03-03-17. next appt 06-23-17.     temazepam (RESTORIL) 7.5 MG capsule  Medication  Date: 03/03/2017 Department: [facility] Ordering/Authorizing: Ursula Alert, MD  Order Providers   Prescribing Provider Encounter Provider  Ursula Alert, MD Ursula Alert, MD  Medication Detail    Disp Refills Start End   temazepam (RESTORIL) 7.5 MG capsule 15 capsule 1 03/03/2017    Sig - Route: Take 1 capsule (7.5 mg total) at bedtime as needed by mouth for sleep (ONLY 1-2 TIMES A MONTH WHEN SHE HAS SEVERE SLEEP ISSUES). - Oral   Class: Print

## 2017-05-28 NOTE — Telephone Encounter (Signed)
pls let pt know Temazepam has been called in to [facility].

## 2017-05-28 NOTE — Telephone Encounter (Signed)
Called in her Temazepam to pharmacy.

## 2017-05-28 NOTE — ED Triage Notes (Signed)
Per EMS, pt from home with reports of falling onto hardwood floor when going to lay down on the couch. Pt states sometimes she gets dizzy. EMS states pt takes night time medications seroquel and trazodone, pt states "I don't know if I took them or not, I probably did." Pt has contusion to left side of forehead. Pt reports pain to forehead, denies pain or inj anywhere else or LOC. Pt alert and able to answer questions at this time. EDP in rm.

## 2017-05-29 LAB — URINALYSIS, COMPLETE (UACMP) WITH MICROSCOPIC
BILIRUBIN URINE: NEGATIVE
Bacteria, UA: NONE SEEN
Glucose, UA: NEGATIVE mg/dL
HGB URINE DIPSTICK: NEGATIVE
Ketones, ur: NEGATIVE mg/dL
LEUKOCYTES UA: NEGATIVE
NITRITE: NEGATIVE
PH: 6 (ref 5.0–8.0)
Protein, ur: NEGATIVE mg/dL
SPECIFIC GRAVITY, URINE: 1.013 (ref 1.005–1.030)
Squamous Epithelial / LPF: NONE SEEN

## 2017-05-29 MED ORDER — LIDOCAINE-EPINEPHRINE (PF) 1 %-1:200000 IJ SOLN
INTRAMUSCULAR | Status: AC
  Filled 2017-05-29: qty 30

## 2017-05-29 MED ORDER — TEMAZEPAM 7.5 MG PO CAPS: 7.5000 mg | ORAL_CAPSULE | Freq: Every evening | ORAL | 0 refills | Status: DC | PRN

## 2017-05-29 NOTE — Telephone Encounter (Signed)
Sent another script for temazepam with instruction to disregard if script filled yesterday since this was called in by writer yesterday

## 2017-05-29 NOTE — Telephone Encounter (Signed)
received another notice from pharmacy in regards to getting a refill on temazepam.

## 2017-05-29 NOTE — Discharge Instructions (Signed)
Today I put a total of 3 6-0 nylon sutures were cut which need to come out in 7 days.  Please return to the emergency department sooner for any new or worsening symptoms whatsoever.  It was a pleasure to take care of you today, and thank you for coming to our emergency department.  If you have any questions or concerns before leaving please ask the nurse to grab me and I'm more than happy to go through your aftercare instructions again.  If you were prescribed any opioid pain medication today such as Norco, Vicodin, Percocet, morphine, hydrocodone, or oxycodone please make sure you do not drive when you are taking this medication as it can alter your ability to drive safely.  If you have any concerns once you are home that you are not improving or are in fact getting worse before you can make it to your follow-up appointment, please do not hesitate to call 911 and come back for further evaluation.  Darel Hong, MD  Results for orders placed or performed during the hospital encounter of 05/28/17  Comprehensive metabolic panel  Result Value Ref Range   Sodium 139 135 - 145 mmol/L   Potassium 4.1 3.5 - 5.1 mmol/L   Chloride 108 101 - 111 mmol/L   CO2 25 22 - 32 mmol/L   Glucose, Bld 119 (H) 65 - 99 mg/dL   BUN 10 6 - 20 mg/dL   Creatinine, Ser 0.81 0.44 - 1.00 mg/dL   Calcium 8.5 (L) 8.9 - 10.3 mg/dL   Total Protein 6.1 (L) 6.5 - 8.1 g/dL   Albumin 3.0 (L) 3.5 - 5.0 g/dL   AST 29 15 - 41 U/L   ALT 11 (L) 14 - 54 U/L   Alkaline Phosphatase 108 38 - 126 U/L   Total Bilirubin 1.1 0.3 - 1.2 mg/dL   GFR calc non Af Amer >60 >60 mL/min   GFR calc Af Amer >60 >60 mL/min   Anion gap 6 5 - 15  Troponin I  Result Value Ref Range   Troponin I <0.03 <0.03 ng/mL  CBC with Differential  Result Value Ref Range   WBC 1.8 (L) 3.6 - 11.0 K/uL   RBC 4.26 3.80 - 5.20 MIL/uL   Hemoglobin 11.8 (L) 12.0 - 16.0 g/dL   HCT 36.6 35.0 - 47.0 %   MCV 85.9 80.0 - 100.0 fL   MCH 27.7 26.0 - 34.0 pg   MCHC  32.2 32.0 - 36.0 g/dL   RDW 19.3 (H) 11.5 - 14.5 %   Platelets 58 (L) 150 - 440 K/uL   Neutrophils Relative % 51 %   Neutro Abs 0.9 (L) 1.4 - 6.5 K/uL   Lymphocytes Relative 34 %   Lymphs Abs 0.6 (L) 1.0 - 3.6 K/uL   Monocytes Relative 11 %   Monocytes Absolute 0.2 0.2 - 0.9 K/uL   Eosinophils Relative 3 %   Eosinophils Absolute 0.1 0 - 0.7 K/uL   Basophils Relative 1 %   Basophils Absolute 0.0 0 - 0.1 K/uL   Ct Head Wo Contrast  Result Date: 05/28/2017 CLINICAL DATA:  Ataxia, post head trauma EXAM: CT HEAD WITHOUT CONTRAST TECHNIQUE: Contiguous axial images were obtained from the base of the skull through the vertex without intravenous contrast. COMPARISON:  CT brain 08/21/2013 FINDINGS: Brain: No acute territorial infarction, hemorrhage or intracranial mass is visualized. Atrophy with mild small vessel ischemic changes of the white matter. Stable mildly prominent ventricle size. Vascular: No hyperdense vessels. Scattered  calcifications at the carotid siphons. Skull: No depressed skull fracture. Sinuses/Orbits: Mild mucosal thickening in the ethmoid sinuses. No acute orbital abnormality. Other: Mild left supraorbital and forehead soft tissue swelling IMPRESSION: 1. No CT evidence for acute intracranial abnormality 2. Atrophy and mild small vessel ischemic changes of the white matter Electronically Signed   By: Donavan Foil M.D.   On: 05/28/2017 23:34

## 2017-06-04 ENCOUNTER — Encounter: Payer: Self-pay | Admitting: Emergency Medicine

## 2017-06-04 ENCOUNTER — Emergency Department
Admission: EM | Admit: 2017-06-04 | Discharge: 2017-06-04 | Disposition: A | Discharge status: Home / Self Care | Payer: Medicare Other | Attending: Student in an Organized Health Care Education/Training Program | Admitting: Student in an Organized Health Care Education/Training Program

## 2017-06-04 ENCOUNTER — Other Ambulatory Visit: Payer: Self-pay

## 2017-06-04 DIAGNOSIS — Z79899 Other long term (current) drug therapy: Secondary | ICD-10-CM | POA: Insufficient documentation

## 2017-06-04 DIAGNOSIS — Z4802 Encounter for removal of sutures: Secondary | ICD-10-CM | POA: Insufficient documentation

## 2017-06-04 DIAGNOSIS — E039 Hypothyroidism, unspecified: Secondary | ICD-10-CM | POA: Diagnosis not present

## 2017-06-04 NOTE — ED Provider Notes (Signed)
[facility] Emergency Department Provider Note  ____________________________________________   First MD Initiated Contact with Patient 06/04/17 [phone]     (approximate)  I have reviewed the triage vital signs and the nursing notes.   HISTORY  Chief Complaint Suture / Staple Removal   HPI Cassidy [name] is a 77 y.o. female is here for suture removal.  Patient was seen on 05/28/17 for a laceration.  She denies any difficulty or any signs of infection.  There has been some facial bruising secondary to her injury.   Past Medical History:  Diagnosis Date  . Anxiety   . Depression   . Fatty liver   . GERD (gastroesophageal reflux disease)   . Hypothyroidism   . Obesity   . Thyroid disease     Patient Active Problem List   Diagnosis Date Noted  . Severe recurrent major depression without psychotic features ([place]) 06/19/2016  . Hepatic encephalopathy ([place]) 08/13/2015  . Thrombocytopenia ([place]) 07/23/2015  . Amnesia 03/20/2015  . Major depression in remission ([place]) 03/12/2015  . Gonalgia 01/05/2015  . Absolute anemia 11/27/2014  . Arthritis 11/27/2014  . Acid reflux 11/27/2014  . Depression, major, recurrent, in partial remission ([place]) 11/27/2014  . Encounter for general adult medical examination without abnormal findings 08/25/2014  . Chronic LBP 08/15/2014  . Complete rotator cuff rupture of left shoulder 05/01/2014  . Infraspinatus tenosynovitis 05/01/2014  . Other synovitis and tenosynovitis, right shoulder 05/01/2014  . Difficulty in walking 11/30/2013  . Extreme obesity 11/30/2013  . Morbid obesity ([place]) 11/30/2013  . Arthritis, degenerative 10/01/2013  . Steatohepatitis 10/01/2013  . Orthostasis 10/01/2013  . 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 Fall 09/26/2013  . Dizziness 09/07/2013  . Osteoporosis with fracture 09/06/2013  . Clinical depression 03/25/2012  . Adult hypothyroidism 03/25/2012  . Esophageal stenosis 07/03/2009  . Back ache  03/08/2003  . Anxiety state 12/27/2002    Past Surgical History:  Procedure Laterality Date  . APPENDECTOMY    . ESOPHAGOGASTRODUODENOSCOPY (EGD) WITH PROPOFOL N/A 12/20/2014   Procedure: ESOPHAGOGASTRODUODENOSCOPY (EGD) WITH PROPOFOL;  Surgeon: Manya Silvas, MD;  Location: [facility] ENDOSCOPY;  Service: Endoscopy;  Laterality: N/A;  . SAVORY DILATION N/A 12/20/2014   Procedure: SAVORY DILATION;  Surgeon: Manya Silvas, MD;  Location[facility] ENDOSCOPY;  Service: Endoscopy;  Laterality: N/A;  . TONSILLECTOMY      Prior to Admission medications   Medication Sig Start Date End Date Taking? Authorizing Provider  ampicillin (PRINCIPEN) 500 MG capsule Take 1 capsule (500 mg total) by mouth every 8 (eight) hours. Patient not taking: Reported on 03/03/2017 07/04/16   Clapacs, Madie Reno, MD  busPIRone (BUSPAR) 15 MG tablet Take 1 tablet (15 mg total) at bedtime by mouth. 03/03/17   Ursula Alert, MD  escitalopram (LEXAPRO) 5 MG tablet Take 1 tablet (5 mg total) at bedtime by mouth. 03/03/17   Eappen, Ria Clock, MD  fluticasone (FLONASE) 50 MCG/ACT nasal spray Place 2 sprays into both nostrils daily. 07/04/16   Clapacs, Madie Reno, MD  levothyroxine (SYNTHROID, LEVOTHROID) 75 MCG tablet Take 1 tablet (75 mcg total) by mouth daily before breakfast. 07/04/16   Clapacs, Madie Reno, MD  omeprazole (PRILOSEC) 20 MG capsule Take 20 mg by mouth 2 (two) times daily.    [provider]  Potassium Chloride (KLOR-CON 10 PO) Take by mouth.    [provider]  QUEtiapine (SEROQUEL) 100 MG tablet Take 1 tablet (100 mg total) at bedtime by mouth. 03/03/17   Ursula Alert, MD  temazepam (RESTORIL) 7.5 MG capsule Take 1 capsule (7.5 mg total) by mouth at bedtime as needed for sleep (ONLY 1-2 TIMES A MONTH WHEN SHE HAS SEVERE SLEEP ISSUES). 05/29/17   Ursula Alert, MD  traZODone (DESYREL) 100 MG tablet Take 1.5 tablets (150 mg total) at bedtime by mouth. 03/03/17   Ursula Alert, MD    Allergies Sulfa  antibiotics  Family History  Problem Relation Age of Onset  . Diabetes Mother   . COPD Mother   . Kidney disease Mother   . Depression Mother   . Heart attack Father   . Aneurysm Father   . Anxiety disorder Sister   . Depression Sister   . Diabetes Sister   . Hypertension Sister   . Atrial fibrillation Brother   . Spinal muscular atrophy Brother   . Breast cancer Sister   . Bone cancer Sister   . Depression Sister   . Anxiety disorder Sister   . Breast cancer Sister   . Colon cancer Sister   . Depression Sister   . Diabetes Sister   . COPD Sister   . Depression Sister   . Anxiety disorder Sister   . Diabetes Brother   . Depression Brother     Social History Social History   Tobacco Use  . Smoking status: Never Smoker  . Smokeless tobacco: Never Used  Substance Use Topics  . Alcohol use: No  . Drug use: No    Review of Systems Constitutional: No fever/chills Eyes: No visual changes. ENT: No trauma Cardiovascular: Denies chest pain. Respiratory: Denies shortness of breath. Musculoskeletal: Negative for muscle skeletal pain. Skin: Positive for healing laceration. Neurological: Negative for headaches ____________________________________________   PHYSICAL EXAM:  VITAL SIGNS: ED Triage Vitals  Enc Vitals Group     BP 06/04/17 0904 (!) 158/67     Pulse Rate 06/04/17 0904 83     Resp 06/04/17 0904 17     Temp 06/04/17 0904 98.4 F (36.9 C)     Temp Source 06/04/17 0904 Oral     SpO2 06/04/17 0904 95 %     Weight 06/04/17 0900 150 lb (68 kg)     Height 06/04/17 0900 5' 2"  (1.575 m)     Head Circumference --      Peak Flow --      Pain Score --      Pain Loc --      Pain Edu? --      Excl. in [place]? --    Constitutional: Alert and oriented. Well appearing and in no acute distress. Eyes: Conjunctivae are normal. PERRL. EOMI. Head: Atraumatic. Nose: No congestion/rhinnorhea. Neck: No stridor.   Cardiovascular: Normal rate, regular rhythm. Grossly  normal heart sounds.  Good peripheral circulation. Respiratory: Normal respiratory effort.  No retractions. Lungs CTAB. Musculoskeletal: Moves upper and lower extremities without any difficulty.  Normal gait was noted. Neurologic:  Normal speech and language. No gross focal neurologic deficits are appreciated.  Skin:  Skin is warm, dry and intact.  Area appears to be healing without any signs of infection. Psychiatric: Mood and affect are normal. Speech and behavior are normal.  ____________________________________________   LABS (all labs ordered are listed, but only abnormal results are displayed)  Labs Reviewed - No data to display  PROCEDURES  Procedure(s) performed: None  Procedures  Critical Care performed: No  ____________________________________________   INITIAL IMPRESSION / ASSESSMENT AND PLAN / ED COURSE Sutures were removed by the RN.  Area has healed without  any signs of infection.  Patient is reassured.  She was given instructions on taking care of this area to follow-up with her PCP if any continued problems. ____________________________________________   FINAL CLINICAL IMPRESSION(S) / ED DIAGNOSES  Final diagnoses:  Encounter for removal of sutures     ED Discharge Orders    None       Note:  This document was prepared using Dragon voice recognition software and may include unintentional dictation errors.    Johnn Hai, PA-C 06/04/17 1032    Merlyn Lot, MD 06/04/17 1150

## 2017-06-04 NOTE — ED Triage Notes (Signed)
Here for suture removal

## 2017-06-04 NOTE — Discharge Instructions (Signed)
Dr. Ouida Sills if any continued problems.

## 2017-06-19 ENCOUNTER — Emergency Department: Payer: Medicare Other

## 2017-06-19 ENCOUNTER — Emergency Department
Admission: EM | Admit: 2017-06-19 | Discharge: 2017-06-19 | Disposition: A | Discharge status: Home / Self Care | Payer: Medicare Other | Attending: Emergency Medicine | Admitting: Emergency Medicine

## 2017-06-19 ENCOUNTER — Other Ambulatory Visit: Payer: Self-pay

## 2017-06-19 DIAGNOSIS — R112 Nausea with vomiting, unspecified: Secondary | ICD-10-CM

## 2017-06-19 DIAGNOSIS — R1010 Upper abdominal pain, unspecified: Secondary | ICD-10-CM | POA: Insufficient documentation

## 2017-06-19 DIAGNOSIS — E039 Hypothyroidism, unspecified: Secondary | ICD-10-CM | POA: Diagnosis not present

## 2017-06-19 LAB — COMPREHENSIVE METABOLIC PANEL
ALK PHOS: 80 U/L (ref 38–126)
ALT: 11 U/L — ABNORMAL LOW (ref 14–54)
ANION GAP: 5 (ref 5–15)
AST: 30 U/L (ref 15–41)
Albumin: 2.9 g/dL — ABNORMAL LOW (ref 3.5–5.0)
BUN: 10 mg/dL (ref 6–20)
CALCIUM: 7.8 mg/dL — AB (ref 8.9–10.3)
CO2: 24 mmol/L (ref 22–32)
Chloride: 112 mmol/L — ABNORMAL HIGH (ref 101–111)
Creatinine, Ser: 0.6 mg/dL (ref 0.44–1.00)
GFR calc non Af Amer: >60 mL/min (ref >60)
Glucose, Bld: 98 mg/dL (ref 65–99)
POTASSIUM: 3.7 mmol/L (ref 3.5–5.1)
Sodium: 141 mmol/L (ref 135–145)
Total Bilirubin: 1.2 mg/dL (ref 0.3–1.2)
Total Protein: 6 g/dL — ABNORMAL LOW (ref 6.5–8.1)

## 2017-06-19 LAB — CBC WITH DIFFERENTIAL/PLATELET
Basophils Absolute: 0 10*3/uL (ref 0–0.1)
Basophils Relative: 1 %
Eosinophils Absolute: 0.1 10*3/uL (ref 0–0.7)
Eosinophils Relative: 5 %
HCT: 38.6 % (ref 35.0–47.0)
Hemoglobin: 12.6 g/dL (ref 12.0–16.0)
Lymphocytes Relative: 21 %
Lymphs Abs: 0.4 10*3/uL — ABNORMAL LOW (ref 1.0–3.6)
MCH: 28.5 pg (ref 26.0–34.0)
MCHC: 32.6 g/dL (ref 32.0–36.0)
MCV: 87.3 fL (ref 80.0–100.0)
Monocytes Absolute: 0.1 10*3/uL — ABNORMAL LOW (ref 0.2–0.9)
Monocytes Relative: 7 %
Neutro Abs: 1.3 10*3/uL — ABNORMAL LOW (ref 1.4–6.5)
Neutrophils Relative %: 66 %
Platelets: 57 10*3/uL — ABNORMAL LOW (ref 150–440)
RBC: 4.42 MIL/uL (ref 3.80–5.20)
RDW: 19.1 % — ABNORMAL HIGH (ref 11.5–14.5)
WBC: 1.9 10*3/uL — ABNORMAL LOW (ref 3.6–11.0)

## 2017-06-19 LAB — RAPID HIV SCREEN (HIV 1/2 AB+AG)
HIV 1/2 ANTIBODIES: NONREACTIVE
HIV-1 P24 Antigen - HIV24: NONREACTIVE

## 2017-06-19 LAB — TROPONIN I: Troponin I: <0.03 ng/mL (ref <0.03)

## 2017-06-19 MED ORDER — ONDANSETRON HCL 4 MG/2ML IJ SOLN
4.0000 mg | Freq: Once | INTRAMUSCULAR | Status: AC
  Administered 2017-06-19: 4 mg via INTRAVENOUS
  Filled 2017-06-19: qty 2

## 2017-06-19 MED ORDER — OMEPRAZOLE 40 MG PO CPDR: 40.0000 mg | DELAYED_RELEASE_CAPSULE | Freq: Every day | ORAL | 1 refills | Status: DC

## 2017-06-19 MED ORDER — ONDANSETRON 4 MG PO TBDP: 4.0000 mg | ORAL_TABLET | Freq: Three times a day (TID) | ORAL | 0 refills | Status: DC | PRN

## 2017-06-19 MED ORDER — SODIUM CHLORIDE 0.9 % IV SOLN
Freq: Once | INTRAVENOUS | Status: AC
  Administered 2017-06-19: 18:00:00 via INTRAVENOUS

## 2017-06-19 NOTE — ED Triage Notes (Addendum)
Pt arrived via EMS from home d/t n/v x2 days. Pt reports 10-12 episodes of emesis. Pt denies any diarrhea. Pt reports significant weight loss over the past few months, with no appetite. Pt is A&O x4 at this time with stable VS.

## 2017-06-19 NOTE — ED Provider Notes (Signed)
[facility] Emergency Department Provider Note       Time seen: ----------------------------------------- 5:11 PM on 06/19/2017 -----------------------------------------   I have reviewed the triage vital signs and the nursing notes.  HISTORY   Chief Complaint No chief complaint on file.    HPI Cassidy [name] is a 77 y.o. female with a history of anxiety, depression, GERD, hypothyroidism, obesity and severe recurrent depression who presents to the ED for nausea, vomiting for the past 2 days.  Patient arrives via EMS from home for same.  Patient reports 10-12 episodes of vomiting.  Patient states it tightens up across her upper abdomen and that was the reason family called EMS.  There was concerns maybe she was having a heart attack.  She has had significant weight loss over the last year after her daughter died.  She has no pain at this time.  Past Medical History:  Diagnosis Date  . Anxiety   . Depression   . Fatty liver   . GERD (gastroesophageal reflux disease)   . Hypothyroidism   . Obesity   . Thyroid disease     Patient Active Problem List   Diagnosis Date Noted  . Severe recurrent major depression without psychotic features ([place]) 06/19/2016  . Hepatic encephalopathy ([place]) 08/13/2015  . Thrombocytopenia ([place]) 07/23/2015  . Amnesia 03/20/2015  . Major depression in remission ([place]) 03/12/2015  . Gonalgia 01/05/2015  . Absolute anemia 11/27/2014  . Arthritis 11/27/2014  . Acid reflux 11/27/2014  . Depression, major, recurrent, in partial remission ([place]) 11/27/2014  . Encounter for general adult medical examination without abnormal findings 08/25/2014  . Chronic LBP 08/15/2014  . Complete rotator cuff rupture of left shoulder 05/01/2014  . Infraspinatus tenosynovitis 05/01/2014  . Other synovitis and tenosynovitis, right shoulder 05/01/2014  . Difficulty in walking 11/30/2013  . Extreme obesity 11/30/2013  . Morbid obesity ([place]) 11/30/2013   . Arthritis, degenerative 10/01/2013  . Steatohepatitis 10/01/2013  . Orthostasis 10/01/2013  . 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 Fall 09/26/2013  . Dizziness 09/07/2013  . Osteoporosis with fracture 09/06/2013  . Clinical depression 03/25/2012  . Adult hypothyroidism 03/25/2012  . Esophageal stenosis 07/03/2009  . Back ache 03/08/2003  . Anxiety state 12/27/2002    Past Surgical History:  Procedure Laterality Date  . APPENDECTOMY    . ESOPHAGOGASTRODUODENOSCOPY (EGD) WITH PROPOFOL N/A 12/20/2014   Procedure: ESOPHAGOGASTRODUODENOSCOPY (EGD) WITH PROPOFOL;  Surgeon: Manya Silvas, MD;  Location: [facility] ENDOSCOPY;  Service: Endoscopy;  Laterality: N/A;  . SAVORY DILATION N/A 12/20/2014   Procedure: SAVORY DILATION;  Surgeon: Manya Silvas, MD;  Location: [facility] ENDOSCOPY;  Service: Endoscopy;  Laterality: N/A;  . TONSILLECTOMY      Allergies Sulfa antibiotics  Social History Social History   Tobacco Use  . Smoking status: Never Smoker  . Smokeless tobacco: Never Used  Substance Use Topics  . Alcohol use: No  . Drug use: No    Review of Systems Constitutional: Negative for fever. Cardiovascular: Negative for chest pain. Respiratory: Negative for shortness of breath. Gastrointestinal: Positive for recent abdominal pain and vomiting Genitourinary: Negative for dysuria. Musculoskeletal: Negative for back pain. Skin: Negative for rash. Neurological: Negative for headaches, focal weakness or numbness.  All systems negative/normal/unremarkable except as stated in the HPI  ____________________________________________   PHYSICAL EXAM:  VITAL SIGNS: ED Triage Vitals  Enc Vitals Group     BP      Pulse      Resp      Temp  Temp src      SpO2      Weight      Height      Head Circumference      Peak Flow      Pain Score      Pain Loc      Pain Edu?      Excl. in [place]?    Constitutional: Alert and oriented. Well appearing and in no distress. Eyes:  Conjunctivae are normal. Normal extraocular movements. ENT   Head: Normocephalic and atraumatic.   Nose: No congestion/rhinnorhea.   Mouth/Throat: Mucous membranes are moist.   Neck: No stridor. Cardiovascular: Normal rate, regular rhythm. No murmurs, rubs, or gallops. Respiratory: Normal respiratory effort without tachypnea nor retractions. Breath sounds are clear and equal bilaterally. No wheezes/rales/rhonchi. Gastrointestinal: Soft and nontender. Normal bowel sounds Musculoskeletal: Nontender with normal range of motion in extremities. No lower extremity tenderness nor edema. Neurologic:  Normal speech and language. No gross focal neurologic deficits are appreciated.  Skin:  Skin is warm, dry and intact. No rash noted. Psychiatric: Mood and affect are normal. Speech and behavior are normal.  ____________________________________________  EKG: Interpreted by me.  Sinus rhythm rate 69 bpm, normal PR interval, normal QRS, normal QT.  ____________________________________________  ED COURSE:  As part of my medical decision making, I reviewed the following data within the [place] History obtained from family if available, nursing notes, old chart and ekg, as well as notes from prior ED visits. Patient presented for abdominal pain and vomiting, we will assess with labs and imaging as indicated at this time.   Procedures ____________________________________________   LABS (pertinent positives/negatives)  Labs Reviewed  COMPREHENSIVE METABOLIC PANEL - Abnormal; Notable for the following components:      Result Value   Chloride 112 (*)    Calcium 7.8 (*)    Total Protein 6.0 (*)    Albumin 2.9 (*)    ALT 11 (*)    All other components within normal limits  CBC WITH DIFFERENTIAL/PLATELET - Abnormal; Notable for the following components:   WBC 1.9 (*)    RDW 19.1 (*)    Platelets 57 (*)    Neutro Abs 1.3 (*)    Lymphs Abs 0.4 (*)    Monocytes  Absolute 0.1 (*)    All other components within normal limits  TROPONIN I  URINALYSIS, COMPLETE (UACMP) WITH MICROSCOPIC  RAPID HIV SCREEN (HIV 1/2 AB+AG)    RADIOLOGY Images were viewed by me  Acute abdominal series No bowel obstruction, small right pleural effusion ____________________________________________  DIFFERENTIAL DIAGNOSIS   Depression, anxiety, GERD, peptic ulcer disease, pancreatitis, cholecystitis  FINAL ASSESSMENT AND PLAN  Abdominal pain, vomiting   Plan: The patient had presented for abdominal pain and vomiting. Patient's labs were reassuring other than leukopenia of uncertain etiology which she has had prior. Patient's imaging was negative other than a small right pleural effusion of uncertain significance.  She is stable for outpatient follow-up with her doctor.   Laurence Aly, MD   Note: This note was generated in part or whole with voice recognition software. Voice recognition is usually quite accurate but there are transcription errors that can and very often do occur. I apologize for any typographical errors that were not detected and corrected.     Earleen Newport, MD 06/19/17 2014

## 2017-06-19 NOTE — ED Notes (Signed)
Pt has been having n/v since last night. Pt stating numerous episode of vomiting over night. Pt denying fever and diarrhea. Pt stating that she was having spasms "under my girls." Pt stating that it was while she was vomiting she was having the pain. Pt denying pain at this time. Pt's sister stating that since pt lost her daughter she has not been eating correctly. Pt's sister stating that she has refused to eat full meals and that she is constantly having to encourage her to eat. Pt's sister stating that she used to wear a size 3x 18 months ago and now wears a large. Sister is concerned that she has failure to thrive.

## 2017-06-22 ENCOUNTER — Telehealth: Payer: Self-pay

## 2017-06-22 NOTE — Telephone Encounter (Signed)
Please call patient to schedule an appointment and I will refill her script to last till then.

## 2017-06-23 ENCOUNTER — Other Ambulatory Visit: Payer: Self-pay

## 2017-06-23 ENCOUNTER — Encounter: Payer: Self-pay | Admitting: Psychiatry

## 2017-06-23 ENCOUNTER — Ambulatory Visit (INDEPENDENT_AMBULATORY_CARE_PROVIDER_SITE_OTHER): Payer: Medicare Other | Admitting: Psychiatry

## 2017-06-23 VITALS — BP 113/73 | HR 73 | Temp 97.9°F

## 2017-06-23 DIAGNOSIS — F330 Major depressive disorder, recurrent, mild: Secondary | ICD-10-CM

## 2017-06-23 DIAGNOSIS — F411 Generalized anxiety disorder: Secondary | ICD-10-CM

## 2017-06-23 DIAGNOSIS — F5105 Insomnia due to other mental disorder: Secondary | ICD-10-CM | POA: Diagnosis not present

## 2017-06-23 MED ORDER — TRAZODONE HCL 100 MG PO TABS
150.0000 mg | ORAL_TABLET | Freq: Every day | ORAL | 3 refills | Status: DC
Start: 2017-06-23 — End: 2017-09-22

## 2017-06-23 MED ORDER — BUSPIRONE HCL 15 MG PO TABS: 15.0000 mg | ORAL_TABLET | Freq: Every day | ORAL | 3 refills | Status: DC

## 2017-06-23 MED ORDER — ESCITALOPRAM OXALATE 10 MG PO TABS: 5.0000 mg | ORAL_TABLET | Freq: Every day | ORAL | 3 refills | Status: DC

## 2017-06-23 MED ORDER — QUETIAPINE FUMARATE 100 MG PO TABS: 100.0000 mg | ORAL_TABLET | Freq: Every day | ORAL | 3 refills | Status: DC

## 2017-06-23 NOTE — Progress Notes (Signed)
[facility] MD OP Progress Note  06/23/2017 8:55 AM Cassidy [name]  MRN:  [id number]  Chief Complaint: ' I am ok."  Chief Complaint    Follow-up; Medication Refill     HPI: Cassidy [name] is a 77 y old Caucasian female who has a history of major depressive disorder, generalized anxiety disorder, panic disorder and insomnia, lives in [place] along with her grandson Cassidy [name], presented to the clinic today for a follow-up visit.  Patient today reports that she is doing well.  She had a fall earlier in February.  She was taken to the emergency department at that time.She also had some injuries to her face which required staples.  Patient again continued to feel dizzy.  She was found to be dehydrated and it was corrected few days ago in the emergency department.  She continues to drink water regularly now.  Her grandson Cassidy [name] is very supportive and has made sure that she gets reminded to stay hydrated.  Patient was taking temazepam as needed may be once a month or so when she needed to sleep.  She was taken off of the temazepam and started on melatonin when she had the dizzy spells.  Per patient as well as grandson patient is sleeping better on the melatonin at this time.  She however does have a history of having severe insomnia which required multiple medications to correct it in the past.  Hence patient would like to be continued on the current medication regimen and does not want any changes.  She otherwise appears alert and oriented.  She is able to answer all questions appropriately.  Denies any new concerns at this time. Visit Diagnosis:    ICD-10-CM   1. MDD (major depressive disorder), recurrent episode, mild (HCC) F33.0 traZODone (DESYREL) 100 MG tablet    QUEtiapine (SEROQUEL) 100 MG tablet    busPIRone (BUSPAR) 15 MG tablet    escitalopram (LEXAPRO) 10 MG tablet  2. GAD (generalized anxiety disorder) F41.1 escitalopram (LEXAPRO) 10 MG tablet  3. Insomnia due to mental disorder F51.05     Past  Psychiatric History: History of depression, insomnia, anxiety symptoms.  Has been following up with [facility] since the past several years.  She also had ECT treatments done in the past without much benefit  Past Medical History:  Past Medical History:  Diagnosis Date  . Anxiety   . Depression   . Fatty liver   . GERD (gastroesophageal reflux disease)   . Hypothyroidism   . Obesity   . Thyroid disease     Past Surgical History:  Procedure Laterality Date  . APPENDECTOMY    . ESOPHAGOGASTRODUODENOSCOPY (EGD) WITH PROPOFOL N/A 12/20/2014   Procedure: ESOPHAGOGASTRODUODENOSCOPY (EGD) WITH PROPOFOL;  Surgeon: Manya Silvas, MD;  Location: [facility] ENDOSCOPY;  Service: Endoscopy;  Laterality: N/A;  . SAVORY DILATION N/A 12/20/2014   Procedure: SAVORY DILATION;  Surgeon: Manya Silvas, MD;  Location: [facility] ENDOSCOPY;  Service: Endoscopy;  Laterality: N/A;  . TONSILLECTOMY      Family Psychiatric History: Positive for depression and anxiety in her family as noted below  Family History:  Family History  Problem Relation Age of Onset  . Diabetes Mother   . COPD Mother   . Kidney disease Mother   . Depression Mother   . Heart attack Father   . Aneurysm Father   . Anxiety disorder Sister   . Depression Sister   . Diabetes Sister   . Hypertension Sister   . Atrial fibrillation Brother   .  Spinal muscular atrophy Brother   . Breast cancer Sister   . Bone cancer Sister   . Depression Sister   . Anxiety disorder Sister   . Breast cancer Sister   . Colon cancer Sister   . Depression Sister   . Diabetes Sister   . COPD Sister   . Depression Sister   . Anxiety disorder Sister   . Diabetes Brother   . Depression Brother    Substance abuse history: Denies   Social History: She lives with her grandson and one of her daughters in [place].  Has good social support from them Social History   Socioeconomic History  . Marital status: Widowed    Spouse name: None  . Number of  children: None  . Years of education: None  . Highest education level: None  Social Needs  . Financial resource strain: None  . Food insecurity - worry: None  . Food insecurity - inability: None  . Transportation needs - medical: None  . Transportation needs - non-medical: None  Occupational History  . None  Tobacco Use  . Smoking status: Never Smoker  . Smokeless tobacco: Never Used  Substance and Sexual Activity  . Alcohol use: No  . Drug use: No  . Sexual activity: Not Currently    Birth control/protection: Post-menopausal  Other Topics Concern  . None  Social History Narrative  . None    Allergies:  Allergies  Allergen Reactions  . Sulfa Antibiotics Shortness Of Breath    Metabolic Disorder Labs: Lab Results  Component Value Date   HGBA1C 5.3 06/21/2016   MPG 105 06/21/2016   No results found for: PROLACTIN Lab Results  Component Value Date   CHOL 145 06/21/2016   TRIG 51 06/21/2016   HDL 44 06/21/2016   CHOLHDL 3.3 06/21/2016   VLDL 10 06/21/2016   LDLCALC 91 06/21/2016   LDLCALC 70 08/22/2013   Lab Results  Component Value Date   TSH 2.195 06/21/2016    Therapeutic Level Labs: No results found for: LITHIUM No results found for: VALPROATE No components found for:  CBMZ  Current Medications: Current Outpatient Medications  Medication Sig Dispense Refill  . ampicillin (PRINCIPEN) 500 MG capsule Take 1 capsule (500 mg total) by mouth every 8 (eight) hours. 21 capsule 0  . azelastine (ASTELIN) 0.1 % nasal spray Place into the nose.    . busPIRone (BUSPAR) 15 MG tablet Take 1 tablet (15 mg total) by mouth at bedtime. 30 tablet 3  . fluticasone (FLONASE) 50 MCG/ACT nasal spray Place 2 sprays into both nostrils daily. 16 g 2  . lactulose (CHRONULAC) 10 GM/15ML solution Take by mouth.    . levothyroxine (SYNTHROID, LEVOTHROID) 75 MCG tablet Take 1 tablet (75 mcg total) by mouth daily before breakfast. 30 tablet 0  . omeprazole (PRILOSEC) 40 MG capsule  Take 1 capsule (40 mg total) by mouth daily. 30 capsule 1  . ondansetron (ZOFRAN ODT) 4 MG disintegrating tablet Take 1 tablet (4 mg total) by mouth every 8 (eight) hours as needed for nausea or vomiting. 20 tablet 0  . Potassium Chloride (KLOR-CON 10 PO) Take by mouth.    . QUEtiapine (SEROQUEL) 100 MG tablet Take 1 tablet (100 mg total) by mouth at bedtime. 30 tablet 3  . traZODone (DESYREL) 100 MG tablet Take 1.5 tablets (150 mg total) by mouth at bedtime. 45 tablet 3  . escitalopram (LEXAPRO) 10 MG tablet Take 0.5 tablets (5 mg total) by mouth at bedtime. 15  tablet 3   No current facility-administered medications for this visit.      Musculoskeletal: Strength & Muscle Tone: within normal limits Gait & Station: is in a wheelchair Patient leans: N/A  Psychiatric Specialty Exam: Review of Systems  Psychiatric/Behavioral: The patient has insomnia (imrpoved).   All other systems reviewed and are negative.   Blood pressure 113/73, pulse 73, temperature 97.9 F (36.6 C), temperature source Oral.There is no height or weight on file to calculate BMI.  General Appearance: Casual  Eye Contact:  Fair  Speech:  Clear and Coherent  Volume:  Normal  Mood:  Euthymic  Affect:  Congruent  Thought Process:  Goal Directed and Descriptions of Associations: Intact  Orientation:  Full (Time, Place, and Person)  Thought Content: Logical   Suicidal Thoughts:  No  Homicidal Thoughts:  No  Memory:  Immediate;   Fair Recent;   Fair Remote;   Fair  Judgement:  Fair  Insight:  Fair  Psychomotor Activity:  Normal  Concentration:  Concentration: Fair and Attention Span: Fair  Recall:  [employer] of Knowledge: Fair  Language: Fair  Akathisia:  No  Handed:  Right  AIMS (if indicated): 0  Assets:  Communication Skills Desire for Improvement Housing Social Support  ADL's:  Intact  Cognition: WNL  Sleep:  Fair   Screenings: AUDIT     Admission (Discharged) from 06/20/2016 in [place]  Alcohol Use Disorder Identification Test Final Score (AUDIT)  0    ECT-MADRS     ECT Treatment from 07/11/2016 in [place] Admission (Discharged) from 06/20/2016 in [place] Total Score  16  16    Mini-Mental     ECT Treatment from 07/11/2016 in [place] Admission (Discharged) from 06/20/2016 in [place]  Total Score (max 30 points )  30  28       Assessment and Plan: Kalyani is a 77 year old Caucasian female who has a history of depression, anxiety, insomnia sent to the clinic today for a follow-up visit.  She presented along with her grandson Cassidy [name].  She does continue to have good social support from her family.  She recently had some dizziness as well as dehydration which were managed in the ED.  She is however currently doing well at this time.  Will continue plan as noted below.  Plan For depression Lexapro 5 mg p.o. daily Seroquel 100 mg p.o. nightly  For anxiety BuSpar 15 mg p.o. Nightly  Insomnia Trazodone 150 mg p.o. nightly Seroquel 100 mg p.o. nightly Continue melatonin as prescribed, she takes over-the-counter melatonin. Discontinue restoril for history of falls as well as dizziness.  She was only taking it 1-2 times per month as needed  Follow-up in clinic in 3 months or sooner if needed.  More than 50 % of the time was spent for psychoeducation and supportive psychotherapy and care coordination. This note was generated in part or whole with voice recognition software. Voice recognition is usually quite accurate but there are transcription errors that can and very often do occur. I apologize for any typographical errors that were not detected and corrected.       Ursula Alert, MD 06/24/2017, 8:55 AM

## 2017-06-24 ENCOUNTER — Encounter: Payer: Self-pay | Admitting: Psychiatry

## 2017-06-25 NOTE — Telephone Encounter (Signed)
Appt made for 09-22-17

## 2017-07-23 ENCOUNTER — Other Ambulatory Visit: Payer: Self-pay | Admitting: Psychiatry

## 2017-07-23 DIAGNOSIS — F330 Major depressive disorder, recurrent, mild: Secondary | ICD-10-CM

## 2017-08-24 ENCOUNTER — Other Ambulatory Visit: Payer: Self-pay | Admitting: Psychiatry

## 2017-08-24 DIAGNOSIS — F330 Major depressive disorder, recurrent, mild: Secondary | ICD-10-CM

## 2017-09-22 ENCOUNTER — Other Ambulatory Visit: Payer: Self-pay

## 2017-09-22 ENCOUNTER — Encounter: Payer: Self-pay | Admitting: Psychiatry

## 2017-09-22 ENCOUNTER — Ambulatory Visit (INDEPENDENT_AMBULATORY_CARE_PROVIDER_SITE_OTHER): Payer: Medicare Other | Admitting: Psychiatry

## 2017-09-22 VITALS — BP 116/66 | HR 77 | Temp 98.1°F

## 2017-09-22 DIAGNOSIS — F5105 Insomnia due to other mental disorder: Secondary | ICD-10-CM

## 2017-09-22 DIAGNOSIS — F411 Generalized anxiety disorder: Secondary | ICD-10-CM | POA: Diagnosis not present

## 2017-09-22 DIAGNOSIS — F330 Major depressive disorder, recurrent, mild: Secondary | ICD-10-CM

## 2017-09-22 MED ORDER — QUETIAPINE FUMARATE 100 MG PO TABS: 100.0000 mg | ORAL_TABLET | Freq: Every day | ORAL | 4 refills | Status: DC

## 2017-09-22 MED ORDER — TRAZODONE HCL 50 MG PO TABS: 50.0000 mg | ORAL_TABLET | Freq: Every evening | ORAL | 4 refills | Status: DC | PRN

## 2017-09-22 MED ORDER — ESCITALOPRAM OXALATE 10 MG PO TABS: 5.0000 mg | ORAL_TABLET | Freq: Every day | ORAL | 4 refills | Status: DC

## 2017-09-22 MED ORDER — BUSPIRONE HCL 15 MG PO TABS: 15.0000 mg | ORAL_TABLET | Freq: Every day | ORAL | 4 refills | Status: DC

## 2017-09-22 MED ORDER — TRAZODONE HCL 100 MG PO TABS: 150.0000 mg | ORAL_TABLET | Freq: Every day | ORAL | 4 refills | Status: DC

## 2017-09-22 NOTE — Progress Notes (Signed)
[facility] MD OP Progress Note  09/22/2017 9:30 AM Cassidy [name]  MRN:  [id number]  Chief Complaint: ' I am here for follow up.' Chief Complaint    Follow-up; Medication Refill     HPI: Cassidy [name] is a 77 year old Caucasian female who has a history of major depressive disorder, generalized anxiety disorder, panic disorder, insomnia, lives in [place] along with her grandson Cassidy [name] who presented to the clinic today for a follow-up visit.  Patient today reports she is currently doing well on the current medication regimen.  She reports her mood as fair.  She has been compliant with her medications.  She denies any side effects.  She however reports she continues to have couple of days every month or so when she has difficulty sleeping.  She was taken off of the temazepam when she had dizzy spell few months ago.  She was advised to take melatonin at that time.  She reports she continues to struggle with sleep problems 2-3 days every month or so.  Discussed with patient to take an additional dose of trazodone dose at night to see if that will help with her sleep.  Patient also has some confusion towards the end of the day, and per grandson Cassidy [name] ,this may have happened 2 or 3 times the past few months.  Hence discussed with patient as well as grandson that medications like temazepam can make it worse and to try the trazodone as needed for sleep.  Patient today appeared alert, oriented and was able to answer all questions and personal information appropriately.  Discussed with patient that we can get some blood workup like TSH, vitamin B12, vitamin D and folate.  Also discussed to monitor symptoms closely to see if her confusion gets worse or it happens more frequently.  Patient denies any other concerns today.   Visit Diagnosis:    ICD-10-CM   1. MDD (major depressive disorder), recurrent episode, mild (HCC) F33.0 busPIRone (BUSPAR) 15 MG tablet    escitalopram (LEXAPRO) 10 MG tablet    QUEtiapine  (SEROQUEL) 100 MG tablet    traZODone (DESYREL) 100 MG tablet  2. GAD (generalized anxiety disorder) F41.1 escitalopram (LEXAPRO) 10 MG tablet  3. Insomnia due to mental disorder F51.05 traZODone (DESYREL) 50 MG tablet    Past Psychiatric History: Reviewed past psychiatric history from my progress note on 06/23/2017.  Past Medical History:  Past Medical History:  Diagnosis Date  . Anxiety   . Depression   . Fatty liver   . GERD (gastroesophageal reflux disease)   . Hypothyroidism   . Obesity   . Thyroid disease     Past Surgical History:  Procedure Laterality Date  . APPENDECTOMY    . ESOPHAGOGASTRODUODENOSCOPY (EGD) WITH PROPOFOL N/A 12/20/2014   Procedure: ESOPHAGOGASTRODUODENOSCOPY (EGD) WITH PROPOFOL;  Surgeon: Manya Silvas, MD;  [facility] ENDOSCOPY;  Service: Endoscopy;  Laterality: N/A;  . SAVORY DILATION N/A 12/20/2014   Procedure: SAVORY DILATION;  Surgeon: Manya Silvas, MD;  Location: [facility] ENDOSCOPY;  Service: Endoscopy;  Laterality: N/A;  . TONSILLECTOMY      Family Psychiatric History: Reviewed family psychiatric history from my progress note on 06/23/2017.  Family History:  Family History  Problem Relation Age of Onset  . Diabetes Mother   . COPD Mother   . Kidney disease Mother   . Depression Mother   . Heart attack Father   . Aneurysm Father   . Anxiety disorder Sister   . Depression Sister   . Diabetes  Sister   . Hypertension Sister   . Atrial fibrillation Brother   . Spinal muscular atrophy Brother   . Breast cancer Sister   . Bone cancer Sister   . Depression Sister   . Anxiety disorder Sister   . Breast cancer Sister   . Colon cancer Sister   . Depression Sister   . Diabetes Sister   . COPD Sister   . Depression Sister   . Anxiety disorder Sister   . Diabetes Brother   . Depression Brother    Substance abuse history: Denies  Social History: Lives with her grandson and one of her daughters in [place].  She has good social  support from the Social History   Socioeconomic History  . Marital status: Widowed    Spouse name: Not on file  . Number of children: Not on file  . Years of education: Not on file  . Highest education level: Not on file  Occupational History  . Not on file  Social Needs  . Financial resource strain: Not on file  . Food insecurity:    Worry: Not on file    Inability: Not on file  . Transportation needs:    Medical: Not on file    Non-medical: Not on file  Tobacco Use  . Smoking status: Never Smoker  . Smokeless tobacco: Never Used  Substance and Sexual Activity  . Alcohol use: No  . Drug use: No  . Sexual activity: Not Currently    Birth control/protection: Post-menopausal  Lifestyle  . Physical activity:    Days per week: Not on file    Minutes per session: Not on file  . Stress: Not on file  Relationships  . Social connections:    Talks on phone: Not on file    Gets together: Not on file    Attends religious service: Not on file    Active member of club or organization: Not on file    Attends meetings of clubs or organizations: Not on file    Relationship status: Not on file  Other Topics Concern  . Not on file  Social History Narrative  . Not on file    Allergies:  Allergies  Allergen Reactions  . Sulfa Antibiotics Shortness Of Breath    Metabolic Disorder Labs: Lab Results  Component Value Date   HGBA1C 5.3 06/21/2016   MPG 105 06/21/2016   No results found for: PROLACTIN Lab Results  Component Value Date   CHOL 145 06/21/2016   TRIG 51 06/21/2016   HDL 44 06/21/2016   CHOLHDL 3.3 06/21/2016   VLDL 10 06/21/2016   LDLCALC 91 06/21/2016   LDLCALC 70 08/22/2013   Lab Results  Component Value Date   TSH 2.195 06/21/2016    Therapeutic Level Labs: No results found for: LITHIUM No results found for: VALPROATE No components found for:  CBMZ  Current Medications: Current Outpatient Medications  Medication Sig Dispense Refill  . ampicillin  (PRINCIPEN) 500 MG capsule Take 1 capsule (500 mg total) by mouth every 8 (eight) hours. 21 capsule 0  . azelastine (ASTELIN) 0.1 % nasal spray Place into the nose.    . busPIRone (BUSPAR) 15 MG tablet Take 1 tablet (15 mg total) by mouth at bedtime. 30 tablet 4  . escitalopram (LEXAPRO) 10 MG tablet Take 0.5 tablets (5 mg total) by mouth at bedtime. 15 tablet 4  . fluticasone (FLONASE) 50 MCG/ACT nasal spray Place 2 sprays into both nostrils daily. 16 g 2  .  lactulose (CHRONULAC) 10 GM/15ML solution Take by mouth.    . levothyroxine (SYNTHROID, LEVOTHROID) 75 MCG tablet Take 1 tablet (75 mcg total) by mouth daily before breakfast. 30 tablet 0  . omeprazole (PRILOSEC) 40 MG capsule Take 1 capsule (40 mg total) by mouth daily. 30 capsule 1  . ondansetron (ZOFRAN ODT) 4 MG disintegrating tablet Take 1 tablet (4 mg total) by mouth every 8 (eight) hours as needed for nausea or vomiting. 20 tablet 0  . Potassium Chloride (KLOR-CON 10 PO) Take by mouth.    . QUEtiapine (SEROQUEL) 100 MG tablet Take 1 tablet (100 mg total) by mouth at bedtime. 30 tablet 4  . traZODone (DESYREL) 100 MG tablet Take 1.5 tablets (150 mg total) by mouth at bedtime. 45 tablet 4  . traZODone (DESYREL) 50 MG tablet Take 1 tablet (50 mg total) by mouth at bedtime as needed for sleep. To be combined with 150 mg 10 tablet 4   No current facility-administered medications for this visit.      Musculoskeletal: Strength & Muscle Tone: within normal limits Gait & Station: in wheelchair Patient leans: N/A  Psychiatric Specialty Exam: Review of Systems  Psychiatric/Behavioral: The patient is nervous/anxious and has insomnia.   All other systems reviewed and are negative.   Blood pressure 116/66, pulse 77, temperature 98.1 F (36.7 C), temperature source Oral.There is no height or weight on file to calculate BMI.  General Appearance: Casual  Eye Contact:  Fair  Speech:  Normal Rate  Volume:  Normal  Mood:  Euthymic  Affect:   Congruent  Thought Process:  Goal Directed and Descriptions of Associations: Intact  Orientation:  Full (Time, Place, and Person)  Thought Content: Logical   Suicidal Thoughts:  No  Homicidal Thoughts:  No  Memory:  Immediate;   Fair Recent;   Fair Remote;   Fair  Judgement:  Fair  Insight:  Fair  Psychomotor Activity:  Normal  Concentration:  Concentration: Fair and Attention Span: Fair  Recall:  [employer] of Knowledge: Fair  Language: Fair  Akathisia:  No  Handed:  Right  AIMS (if indicated): 0  Assets:  Communication Skills Desire for Improvement Housing Social Support  ADL's:  Intact  Cognition: WNL  Sleep:  restless at times   Screenings: AUDIT     Admission (Discharged) from 06/20/2016 in [place]  Alcohol Use Disorder Identification Test Final Score (AUDIT)  0    ECT-MADRS     ECT Treatment from 07/11/2016 in [place] Admission (Discharged) from 06/20/2016 in [place] Total Score  16  16    Mini-Mental     ECT Treatment from 07/11/2016 in [place] Admission (Discharged) from 06/20/2016 in [place]  Total Score (max 30 points )  30  28       Assessment and Plan: Cassidy [name] is a 77 year old Caucasian female who has a history of depression, anxiety, insomnia, presented to the clinic today for a follow-up visit.  Patient today presented along with her grandson Cassidy [name].  Patient continues to have some on and off sleep problems.  Discussed medication changes with patient.  Plan as noted below.  Plan For depression Lexapro 5 mg p.o. daily Seroquel 100 mg p.o. Nightly  Anxiety BuSpar 15 mg p.o. nightly  For insomnia Trazodone 150 mg p.o. nightly Add trazodone 50 mg p.o. nightly as needed for those nights when she cannot sleep.  Seroquel 100 mg p.o. nightly Patient also has melatonin as needed  Order the following  labs-TSH, vitamin B12, folate, vitamin D.  Patient's grandson reports pt possibly has some sundowning-happened 2-3 times since our last visit.  Discussed with patient as well as grandson to monitor symptoms closely.  Will get the above labs in the meantime.  Follow-up in clinic in 4 months or sooner if needed.  More than 50 % of the time was spent for psychoeducation and supportive psychotherapy and care coordination.  This note was generated in part or whole with voice recognition software. Voice recognition is usually quite accurate but there are transcription errors that can and very often do occur. I apologize for any typographical errors that were not detected and corrected.           Ursula Alert, MD 09/22/2017, 9:30 AM

## 2017-10-09 ENCOUNTER — Telehealth: Payer: Self-pay | Admitting: Psychiatry

## 2017-10-09 MED ORDER — TEMAZEPAM 7.5 MG PO CAPS: 7.5000 mg | ORAL_CAPSULE | Freq: Every evening | ORAL | 0 refills | Status: DC | PRN

## 2017-10-09 NOTE — Telephone Encounter (Signed)
Pt called stating that she is not sleeping.  Patient reports she has not slept in the past 3 days and she has a headache and does not feel good.  Patient tried the higher dose of trazodone but did not help.  Patient is also on Seroquel.  Patient has a history of taking temazepam 1-2 times per month when she has these kind of insomnia.  Patient wants to try that again.  We will send a few pills of [facility]azepam [facility]  Advised patient to not combine it with trazodone.  Discussed with her the risk of being on benzodiazepine therapy including falls, dizziness and so on.  Patient reports she has help at home and will be cautious.

## 2017-11-20 ENCOUNTER — Observation Stay: Payer: Medicare Other

## 2017-11-20 ENCOUNTER — Inpatient Hospital Stay
Admission: EM | Admit: 2017-11-20 | Discharge: 2017-11-24 | DRG: 641 | Disposition: A | Discharge status: Home Health | Payer: Medicare Other | Attending: Internal Medicine | Admitting: Internal Medicine

## 2017-11-20 ENCOUNTER — Other Ambulatory Visit: Payer: Self-pay

## 2017-11-20 ENCOUNTER — Encounter: Payer: Self-pay | Admitting: Emergency Medicine

## 2017-11-20 DIAGNOSIS — E669 Obesity, unspecified: Secondary | ICD-10-CM | POA: Diagnosis present

## 2017-11-20 DIAGNOSIS — Z825 Family history of asthma and other chronic lower respiratory diseases: Secondary | ICD-10-CM

## 2017-11-20 DIAGNOSIS — D693 Immune thrombocytopenic purpura: Secondary | ICD-10-CM | POA: Diagnosis present

## 2017-11-20 DIAGNOSIS — F0390 Unspecified dementia without behavioral disturbance: Secondary | ICD-10-CM | POA: Diagnosis present

## 2017-11-20 DIAGNOSIS — D72810 Lymphocytopenia: Secondary | ICD-10-CM | POA: Diagnosis present

## 2017-11-20 DIAGNOSIS — I959 Hypotension, unspecified: Secondary | ICD-10-CM | POA: Diagnosis not present

## 2017-11-20 DIAGNOSIS — E039 Hypothyroidism, unspecified: Secondary | ICD-10-CM | POA: Diagnosis present

## 2017-11-20 DIAGNOSIS — Z800 Family history of malignant neoplasm of digestive organs: Secondary | ICD-10-CM

## 2017-11-20 DIAGNOSIS — K219 Gastro-esophageal reflux disease without esophagitis: Secondary | ICD-10-CM | POA: Diagnosis present

## 2017-11-20 DIAGNOSIS — K222 Esophageal obstruction: Secondary | ICD-10-CM | POA: Diagnosis present

## 2017-11-20 DIAGNOSIS — R011 Cardiac murmur, unspecified: Secondary | ICD-10-CM | POA: Diagnosis present

## 2017-11-20 DIAGNOSIS — R1115 Cyclical vomiting syndrome unrelated to migraine: Secondary | ICD-10-CM

## 2017-11-20 DIAGNOSIS — R161 Splenomegaly, not elsewhere classified: Secondary | ICD-10-CM | POA: Diagnosis present

## 2017-11-20 DIAGNOSIS — Z79899 Other long term (current) drug therapy: Secondary | ICD-10-CM

## 2017-11-20 DIAGNOSIS — R1310 Dysphagia, unspecified: Secondary | ICD-10-CM

## 2017-11-20 DIAGNOSIS — Z803 Family history of malignant neoplasm of breast: Secondary | ICD-10-CM

## 2017-11-20 DIAGNOSIS — E860 Dehydration: Secondary | ICD-10-CM | POA: Diagnosis not present

## 2017-11-20 DIAGNOSIS — F329 Major depressive disorder, single episode, unspecified: Secondary | ICD-10-CM | POA: Diagnosis present

## 2017-11-20 DIAGNOSIS — Z79890 Hormone replacement therapy (postmenopausal): Secondary | ICD-10-CM

## 2017-11-20 DIAGNOSIS — K760 Fatty (change of) liver, not elsewhere classified: Secondary | ICD-10-CM | POA: Diagnosis present

## 2017-11-20 DIAGNOSIS — M545 Low back pain: Secondary | ICD-10-CM | POA: Diagnosis present

## 2017-11-20 DIAGNOSIS — Z818 Family history of other mental and behavioral disorders: Secondary | ICD-10-CM

## 2017-11-20 DIAGNOSIS — F419 Anxiety disorder, unspecified: Secondary | ICD-10-CM | POA: Diagnosis present

## 2017-11-20 DIAGNOSIS — K766 Portal hypertension: Secondary | ICD-10-CM | POA: Diagnosis present

## 2017-11-20 DIAGNOSIS — R112 Nausea with vomiting, unspecified: Secondary | ICD-10-CM | POA: Diagnosis present

## 2017-11-20 DIAGNOSIS — R1110 Vomiting, unspecified: Secondary | ICD-10-CM | POA: Diagnosis not present

## 2017-11-20 DIAGNOSIS — Z841 Family history of disorders of kidney and ureter: Secondary | ICD-10-CM

## 2017-11-20 DIAGNOSIS — Z833 Family history of diabetes mellitus: Secondary | ICD-10-CM

## 2017-11-20 DIAGNOSIS — G8929 Other chronic pain: Secondary | ICD-10-CM | POA: Diagnosis present

## 2017-11-20 DIAGNOSIS — Z882 Allergy status to sulfonamides status: Secondary | ICD-10-CM

## 2017-11-20 DIAGNOSIS — Z8249 Family history of ischemic heart disease and other diseases of the circulatory system: Secondary | ICD-10-CM

## 2017-11-20 DIAGNOSIS — Z6827 Body mass index (BMI) 27.0-27.9, adult: Secondary | ICD-10-CM

## 2017-11-20 LAB — COMPREHENSIVE METABOLIC PANEL
ALBUMIN: 3.5 g/dL (ref 3.5–5.0)
ALT: 16 U/L (ref 0–44)
AST: 32 U/L (ref 15–41)
Alkaline Phosphatase: 79 U/L (ref 38–126)
Anion gap: 7 (ref 5–15)
BILIRUBIN TOTAL: 2.2 mg/dL — AB (ref 0.3–1.2)
BUN: 14 mg/dL (ref 8–23)
CO2: 26 mmol/L (ref 22–32)
CREATININE: 0.54 mg/dL (ref 0.44–1.00)
Calcium: 8.6 mg/dL — ABNORMAL LOW (ref 8.9–10.3)
Chloride: 108 mmol/L (ref 98–111)
GFR calc Af Amer: >60 mL/min (ref >60)
GFR calc non Af Amer: >60 mL/min (ref >60)
GLUCOSE: 104 mg/dL — AB (ref 70–99)
Potassium: 3.7 mmol/L (ref 3.5–5.1)
SODIUM: 141 mmol/L (ref 135–145)
TOTAL PROTEIN: 7 g/dL (ref 6.5–8.1)

## 2017-11-20 LAB — TROPONIN I
Troponin I: <0.03 ng/mL (ref <0.03)
Troponin I: <0.03 ng/mL (ref <0.03)
Troponin I: <0.03 ng/mL (ref <0.03)

## 2017-11-20 LAB — URINALYSIS, COMPLETE (UACMP) WITH MICROSCOPIC
Bacteria, UA: NONE SEEN
Bilirubin Urine: NEGATIVE
GLUCOSE, UA: NEGATIVE mg/dL
Ketones, ur: 20 mg/dL — AB
Leukocytes, UA: NEGATIVE
NITRITE: NEGATIVE
PROTEIN: 30 mg/dL — AB
SPECIFIC GRAVITY, URINE: 1.02 (ref 1.005–1.030)
pH: 6 (ref 5.0–8.0)

## 2017-11-20 LAB — CBC
HCT: 40.5 % (ref 35.0–47.0)
Hemoglobin: 13.7 g/dL (ref 12.0–16.0)
MCH: 29.7 pg (ref 26.0–34.0)
MCHC: 33.8 g/dL (ref 32.0–36.0)
MCV: 87.8 fL (ref 80.0–100.0)
PLATELETS: 59 10*3/uL — AB (ref 150–440)
RBC: 4.61 MIL/uL (ref 3.80–5.20)
RDW: 17.8 % — AB (ref 11.5–14.5)
WBC: 3.6 10*3/uL (ref 3.6–11.0)

## 2017-11-20 LAB — LIPASE, BLOOD: LIPASE: 32 U/L (ref 11–51)

## 2017-11-20 LAB — TSH: TSH: 6.287 u[IU]/mL — ABNORMAL HIGH (ref 0.350–4.500)

## 2017-11-20 MED ORDER — RIFAXIMIN 550 MG PO TABS
550.0000 mg | ORAL_TABLET | Freq: Two times a day (BID) | ORAL | Status: DC
  Administered 2017-11-21 – 2017-11-24 (×7): 550 mg via ORAL
  Filled 2017-11-20 (×8): qty 1

## 2017-11-20 MED ORDER — PROMETHAZINE HCL 25 MG/ML IJ SOLN
12.5000 mg | Freq: Once | INTRAMUSCULAR | Status: AC
  Administered 2017-11-20: 12.5 mg via INTRAVENOUS
  Filled 2017-11-20: qty 1

## 2017-11-20 MED ORDER — PROMETHAZINE HCL 25 MG/ML IJ SOLN
6.2500 mg | Freq: Four times a day (QID) | INTRAMUSCULAR | Status: DC | PRN
  Administered 2017-11-20: 6.25 mg via INTRAVENOUS
  Filled 2017-11-20: qty 1

## 2017-11-20 MED ORDER — POLYETHYLENE GLYCOL 3350 17 G PO PACK
17.0000 g | PACK | Freq: Every day | ORAL | Status: DC | PRN
  Administered 2017-11-23: 17 g via ORAL
  Filled 2017-11-20: qty 1

## 2017-11-20 MED ORDER — ONDANSETRON 4 MG PO TBDP
4.0000 mg | ORAL_TABLET | Freq: Three times a day (TID) | ORAL | Status: DC | PRN
Start: 2017-11-20 — End: 2017-11-24
  Filled 2017-11-20: qty 1

## 2017-11-20 MED ORDER — ONDANSETRON HCL 4 MG/2ML IJ SOLN
4.0000 mg | Freq: Four times a day (QID) | INTRAMUSCULAR | Status: DC | PRN
  Administered 2017-11-20 – 2017-11-21 (×2): 4 mg via INTRAVENOUS
  Filled 2017-11-20 (×2): qty 2

## 2017-11-20 MED ORDER — ACETAMINOPHEN 325 MG PO TABS: 650.0000 mg | ORAL_TABLET | Freq: Four times a day (QID) | ORAL | Status: DC | PRN

## 2017-11-20 MED ORDER — ACETAMINOPHEN 650 MG RE SUPP
650.0000 mg | Freq: Four times a day (QID) | RECTAL | Status: DC | PRN
  Administered 2017-11-21: 05:00:00 650 mg via RECTAL
  Filled 2017-11-20: qty 1

## 2017-11-20 MED ORDER — SODIUM CHLORIDE 0.9 % IV BOLUS
1000.0000 mL | Freq: Once | INTRAVENOUS | Status: AC
  Administered 2017-11-20: 1000 mL via INTRAVENOUS

## 2017-11-20 MED ORDER — ONDANSETRON HCL 4 MG/2ML IJ SOLN
4.0000 mg | Freq: Once | INTRAMUSCULAR | Status: AC
  Administered 2017-11-20: 4 mg via INTRAVENOUS
  Filled 2017-11-20: qty 2

## 2017-11-20 MED ORDER — QUETIAPINE FUMARATE 100 MG PO TABS
100.0000 mg | ORAL_TABLET | Freq: Every day | ORAL | Status: DC
  Administered 2017-11-21 – 2017-11-23 (×3): 100 mg via ORAL
  Filled 2017-11-20 (×3): qty 4
  Filled 2017-11-20: qty 1
  Filled 2017-11-20: qty 4
  Filled 2017-11-20 (×2): qty 1
  Filled 2017-11-20: qty 4
  Filled 2017-11-20 (×2): qty 1

## 2017-11-20 MED ORDER — HEPARIN SODIUM (PORCINE) 5000 UNIT/ML IJ SOLN
5000.0000 [IU] | Freq: Three times a day (TID) | INTRAMUSCULAR | Status: DC
  Administered 2017-11-20 – 2017-11-22 (×5): 5000 [IU] via SUBCUTANEOUS
  Filled 2017-11-20 (×5): qty 1

## 2017-11-20 MED ORDER — ONDANSETRON HCL 4 MG PO TABS: 4.0000 mg | ORAL_TABLET | Freq: Four times a day (QID) | ORAL | Status: DC | PRN

## 2017-11-20 MED ORDER — ESCITALOPRAM OXALATE 10 MG PO TABS
5.0000 mg | ORAL_TABLET | Freq: Every day | ORAL | Status: DC
  Administered 2017-11-21 – 2017-11-23 (×3): 5 mg via ORAL
  Filled 2017-11-20 (×5): qty 0.5

## 2017-11-20 MED ORDER — BUSPIRONE HCL 15 MG PO TABS
15.0000 mg | ORAL_TABLET | Freq: Every day | ORAL | Status: DC
  Administered 2017-11-21 – 2017-11-23 (×3): 15 mg via ORAL
  Filled 2017-11-20 (×5): qty 1

## 2017-11-20 MED ORDER — FLUTICASONE PROPIONATE 50 MCG/ACT NA SUSP
2.0000 | Freq: Every day | NASAL | Status: DC
  Administered 2017-11-21 – 2017-11-24 (×4): 2 via NASAL
  Filled 2017-11-20: qty 16

## 2017-11-20 MED ORDER — FAMOTIDINE IN NACL 20-0.9 MG/50ML-% IV SOLN
20.0000 mg | Freq: Two times a day (BID) | INTRAVENOUS | Status: DC
  Administered 2017-11-21 – 2017-11-23 (×6): 20 mg via INTRAVENOUS
  Filled 2017-11-20 (×8): qty 50

## 2017-11-20 NOTE — Progress Notes (Signed)
Family Meeting Note  Advance Directive:yes  Today a meeting took place with the Patient, sister, grandson.  Patient is unable to participate due BF:XOVANV capacity Dementia   The following clinical team members were present during this meeting:MD  The following were discussed:Patient's diagnosis: Dementia, emesis, Patient's progosis: Unable to determine and Goals for treatment: Full Code  Additional follow-up to be provided: prn  Time spent during discussion:20 minutes  Gorden Harms, MD

## 2017-11-20 NOTE — Progress Notes (Addendum)
Pt was given phenergan at 2226. Nurse came back around 2259 to administered one small pills but pt cant get her meds down and pt refused rest of meds. Will continue to monitor.

## 2017-11-20 NOTE — ED Notes (Signed)
FIRST NURSE NOTE:  Pt here with grandson, states she is dehydrated and needs some fluids.

## 2017-11-20 NOTE — Progress Notes (Addendum)
Pt will have an ultrasound in the abdomen 11/21/17 at 0730. Pt needs to be NPO as per ultrasound department. Notify prime. Will continue to monitor.  Update 2141: Doctor Mayo place an order for pt to be NPO midnight. Will continue to monitor.

## 2017-11-20 NOTE — Progress Notes (Signed)
   11/20/17 1710  Clinical Encounter Type  Visited With Patient and family together  Visit Type Initial   Grandson of patient was in the room and stated his other relatives want to be present when AD are discussed.   Chaplain inquired about patient's dementia and whether she would be able to make these decisions for herself, and grandson deferred to the other relatives who were not present.  Copy of AD left in room; grandson instructed to request chaplain for any questions.

## 2017-11-20 NOTE — H&P (Signed)
[facility] NAME: Cassidy [name]    MR#:  [id number]  DATE OF BIRTH:  03-09-1941  DATE OF ADMISSION:  11/20/2017  PRIMARY CARE PHYSICIAN: Kirk Ruths, MD   REQUESTING/REFERRING PHYSICIAN:   CHIEF COMPLAINT:   Chief Complaint  Patient presents with  . Dehydration  . Emesis    HISTORY OF PRESENT ILLNESS: Cassidy [name]  is a 77 y.o. female with a known history per below presenting with acute dehydration, patient's family states the patient needs IV fluids like before, patient had similar episode in March of this year, presented with decreased p.o. intake, persistent nausea/emesis since yesterday, in the emergency room patient was found to be tachypneic, total bili 2.2, urinalysis noted for ketones, platelet count 59 which is stable compared to previous, patient evaluated in the emergency room, she is a poor historian due to dementia, multiple other family members are good sources of information, patient denies any pain, abdominal exam is benign, no sick contacts, no diarrhea, last bowel movement is unknown, patient is now being admitted for acute persistent nausea/emesis with history of esophageal stenosis/stricture-last dilated 2 years ago with Dr. Vira Agar.  PAST MEDICAL HISTORY:   Past Medical History:  Diagnosis Date  . Anxiety   . Depression   . Fatty liver   . GERD (gastroesophageal reflux disease)   . Hypothyroidism   . Obesity   . Thyroid disease     PAST SURGICAL HISTORY:  Past Surgical History:  Procedure Laterality Date  . APPENDECTOMY    . ESOPHAGOGASTRODUODENOSCOPY (EGD) WITH PROPOFOL N/A 12/20/2014   Procedure: ESOPHAGOGASTRODUODENOSCOPY (EGD) WITH PROPOFOL;  Surgeon: Manya Silvas, MD;  Location: [facility] ENDOSCOPY;  Service: Endoscopy;  Laterality: N/A;  . SAVORY DILATION N/A 12/20/2014   Procedure: SAVORY DILATION;  Surgeon: Manya Silvas, MD;  Location: [facility] ENDOSCOPY;  Service: Endoscopy;  Laterality: N/A;  .  TONSILLECTOMY      SOCIAL HISTORY:  Social History   Tobacco Use  . Smoking status: Never Smoker  . Smokeless tobacco: Never Used  Substance Use Topics  . Alcohol use: No    FAMILY HISTORY:  Family History  Problem Relation Age of Onset  . Diabetes Mother   . COPD Mother   . Kidney disease Mother   . Depression Mother   . Heart attack Father   . Aneurysm Father   . Anxiety disorder Sister   . Depression Sister   . Diabetes Sister   . Hypertension Sister   . Atrial fibrillation Brother   . Spinal muscular atrophy Brother   . Breast cancer Sister   . Bone cancer Sister   . Depression Sister   . Anxiety disorder Sister   . Breast cancer Sister   . Colon cancer Sister   . Depression Sister   . Diabetes Sister   . COPD Sister   . Depression Sister   . Anxiety disorder Sister   . Diabetes Brother   . Depression Brother     DRUG ALLERGIES:  Allergies  Allergen Reactions  . Sulfa Antibiotics Shortness Of Breath    REVIEW OF SYSTEMS: Difficult due to poor historian/dementia  CONSTITUTIONAL: No fever, fatigue or weakness.  EYES: No blurred or double vision.  EARS, NOSE, AND THROAT: No tinnitus or ear pain.  RESPIRATORY: No cough, shortness of breath, wheezing or hemoptysis.  CARDIOVASCULAR: No chest pain, orthopnea, edema.  GASTROINTESTINAL: + nausea, vomiting, no diarrhea or abdominal pain.  GENITOURINARY: No dysuria, hematuria.  ENDOCRINE: No polyuria, nocturia,  HEMATOLOGY: No anemia, easy bruising or bleeding SKIN: No rash or lesion. MUSCULOSKELETAL: No joint pain or arthritis.   NEUROLOGIC: No tingling, numbness, weakness.  PSYCHIATRY: No anxiety or depression.   MEDICATIONS AT HOME:  Prior to Admission medications   Medication Sig Start Date End Date Taking? Authorizing Provider  busPIRone (BUSPAR) 15 MG tablet Take 1 tablet (15 mg total) by mouth at bedtime. 09/22/17  Yes Eappen, Ria Clock, MD  fluticasone (FLONASE) 50 MCG/ACT nasal spray Place 2 sprays  into both nostrils daily. 07/04/16  Yes Clapacs, Madie Reno, MD  omeprazole (PRILOSEC) 40 MG capsule Take 1 capsule (40 mg total) by mouth daily. 06/19/17 06/19/18 Yes Earleen Newport, MD  QUEtiapine (SEROQUEL) 100 MG tablet Take 1 tablet (100 mg total) by mouth at bedtime. 09/22/17  Yes Eappen, Ria Clock, MD  XIFAXAN 550 MG TABS tablet Take 1 tablet by mouth 2 (two) times daily. 10/21/17  Yes [provider]  ondansetron (ZOFRAN ODT) 4 MG disintegrating tablet Take 1 tablet (4 mg total) by mouth every 8 (eight) hours as needed for nausea or vomiting. 06/19/17   Earleen Newport, MD      PHYSICAL EXAMINATION:   VITAL SIGNS: Blood pressure 122/82, pulse 77, temperature 98.9 F (37.2 C), temperature source Oral, resp. rate (!) 25, height 5' (1.524 m), weight 63.5 kg (140 lb), SpO2 92 %.  GENERAL:  77 y.o.-year-old patient lying in the bed with no acute distress.  Frail-appearing EYES: Pupils equal, round, reactive to light and accommodation. No scleral icterus. Extraocular muscles intact.  HEENT: Head atraumatic, normocephalic. Oropharynx and nasopharynx clear.  NECK:  Supple, no jugular venous distention. No thyroid enlargement, no tenderness.  LUNGS: Normal breath sounds bilaterally, no wheezing, rales,rhonchi or crepitation. No use of accessory muscles of respiration.  CARDIOVASCULAR: S1, S2 normal. No murmurs, rubs, or gallops.  ABDOMEN: Soft, nontender, nondistended. Bowel sounds present. No organomegaly or mass.  EXTREMITIES: No pedal edema, cyanosis, or clubbing.  NEUROLOGIC: Cranial nerves II through XII are intact. MAES. Sensation intact. Gait not checked.  PSYCHIATRIC: The patient is alert and oriented x 2-3.  SKIN: No obvious rash, lesion, or ulcer.   LABORATORY PANEL:   CBC Recent Labs  Lab 11/20/17 1158  WBC 3.6  HGB 13.7  HCT 40.5  PLT 59*  MCV 87.8  MCH 29.7  MCHC 33.8  RDW 17.8*    ------------------------------------------------------------------------------------------------------------------  Chemistries  Recent Labs  Lab 11/20/17 1158  NA 141  K 3.7  CL 108  CO2 26  GLUCOSE 104*  BUN 14  CREATININE 0.54  CALCIUM 8.6*  AST 32  ALT 16  ALKPHOS 79  BILITOT 2.2*   ------------------------------------------------------------------------------------------------------------------ estimated creatinine clearance is 49 mL/min (by C-G formula based on SCr of 0.54 mg/dL). ------------------------------------------------------------------------------------------------------------------ No results for input(s): TSH, T4TOTAL, T3FREE, THYROIDAB in the last 72 hours.  Invalid input(s): FREET3   Coagulation profile No results for input(s): INR, PROTIME in the last 168 hours. ------------------------------------------------------------------------------------------------------------------- No results for input(s): DDIMER in the last 72 hours. -------------------------------------------------------------------------------------------------------------------  Cardiac Enzymes Recent Labs  Lab 11/20/17 1158  TROPONINI <0.03   ------------------------------------------------------------------------------------------------------------------ Invalid input(s): POCBNP  ---------------------------------------------------------------------------------------------------------------  Urinalysis    Component Value Date/Time   COLORURINE AMBER (A) 11/20/2017 1314   APPEARANCEUR CLEAR (A) 11/20/2017 1314   APPEARANCEUR Clear 08/21/2013 1035   LABSPEC 1.020 11/20/2017 1314   LABSPEC 1.021 08/21/2013 1035   PHURINE 6.0 11/20/2017 1314   GLUCOSEU NEGATIVE 11/20/2017 1314   GLUCOSEU Negative 08/21/2013 1035  HGBUR SMALL (A) 11/20/2017 1314   BILIRUBINUR NEGATIVE 11/20/2017 1314   BILIRUBINUR Negative 08/21/2013 1035   KETONESUR 20 (A) 11/20/2017 1314   PROTEINUR 30  (A) 11/20/2017 1314   NITRITE NEGATIVE 11/20/2017 1314   LEUKOCYTESUR NEGATIVE 11/20/2017 1314   LEUKOCYTESUR Negative 08/21/2013 1035     RADIOLOGY: No results found.  EKG: Orders placed or performed during the hospital encounter of 11/20/17  . ED EKG  . ED EKG  . EKG 12-Lead  . EKG 12-Lead    IMPRESSION AND PLAN: *Acute recurrent persistent nausea/emesis Exact etiology is unknown Without diarrhea/abdominal pain, noted history of esophageal stricture and GERD Referred to the observation unit, IV fluids for rehydration, antiemetics PRN, check acute abdominal series for further evaluation, check abdominal ultrasound, liquid diet for now, speech therapy to evaluate/treat, consider gastroenterology evaluation if no improvement by tomorrow, and continue close medical monitoring  *Acute dehydration  IV fluids for rehydration  *Chronic thrombocytopenia Etiology unknown Follow-up on abdominal ultrasound, oncology to see, check for platelet antibodies  *Chronic GERD without esophagitis  PPI daily  *Chronic hypothyroidism, unspecified Stable continue Synthroid  *History of esophageal stricture Plan of care as stated above  *Chronic dementia Stable continue home psychotropic regiment Increase nursing care PRN, aspiration/fall/skin care precautions while in house   All the records are reviewed and case discussed with ED provider. Management plans discussed with the patient, family and they are in agreement.  CODE STATUS: Code Status History    Date Active Date Inactive Code Status Order ID Comments User Context   06/20/2016 2332 07/04/2016 1956 Full Code [id number]  Clapacs, Madie Reno, MD Inpatient       TOTAL TIME TAKING CARE OF THIS PATIENT: 45 minutes.    Avel Peace Abdurrahman Petersheim M.D on 11/20/2017   Between 7am to 6pm - Pager - [phone]  After 6pm go to [email or web address] - password [email or web address] [facility]  Office  [phone]  CC: Primary care physician;  Kirk Ruths, MD   Note: This dictation was prepared with Dragon dictation along with smaller phrase technology. Any transcriptional errors that result from this process are unintentional.

## 2017-11-20 NOTE — Plan of Care (Signed)
  Problem: Education: Goal: Knowledge of General Education information will improve Description Including pain rating scale, medication(s)/side effects and non-pharmacologic comfort measures Outcome: Progressing   Problem: Clinical Measurements: Goal: Will remain free from infection Outcome: Progressing   Problem: Safety: Goal: Ability to remain free from injury will improve Outcome: Progressing

## 2017-11-20 NOTE — ED Triage Notes (Signed)
Pt comes into the ED via POV c/o possible dehydration.  Patient is not drinking as much fluid as normal and has been vomiting since yesterday.  Patient denies any pain at this time.  Denies any diarrhea, chest pain, shortness of breath.  Patient in NAD at this time with even and unlabored respirations.

## 2017-11-20 NOTE — ED Provider Notes (Signed)
[facility] Emergency Department Provider Note ____________________________________________   First MD Initiated Contact with Patient 11/20/17 1209     (approximate)  I have reviewed the triage vital signs and the nursing notes.   HISTORY  Chief Complaint Dehydration and Emesis  HPI Cassidy [name] is a 77 y.o. female with a history of anxiety and depression who is presenting to the emergency department with upper abdominal cramping as well as nausea and vomiting over the past 24 hours.  She has vomited multiple times.  Denies any diarrhea.  Says the vomitus has been clear.  Says that she has had this issue for her and thought that she had been dehydrated and so was presented to the emergency department today for fluids and antinausea medicine.  She denies any chest pain or shortness of breath.  Denies any known sick contacts.  Denies any pain right now but says that the cramping sensation comes on the time that she is vomiting.  Past Medical History:  Diagnosis Date  . Anxiety   . Depression   . Fatty liver   . GERD (gastroesophageal reflux disease)   . Hypothyroidism   . Obesity   . Thyroid disease     Patient Active Problem List   Diagnosis Date Noted  . Emesis, persistent 11/20/2017  . Severe recurrent major depression without psychotic features ([place]) 06/19/2016  . Hepatic encephalopathy ([place]) 08/13/2015  . Thrombocytopenia ([place]) 07/23/2015  . Amnesia 03/20/2015  . Major depression in remission ([place]) 03/12/2015  . Gonalgia 01/05/2015  . Absolute anemia 11/27/2014  . Arthritis 11/27/2014  . Acid reflux 11/27/2014  . Depression, major, recurrent, in partial remission ([place]) 11/27/2014  . Encounter for general adult medical examination without abnormal findings 08/25/2014  . Chronic LBP 08/15/2014  . Complete rotator cuff rupture of left shoulder 05/01/2014  . Infraspinatus tenosynovitis 05/01/2014  . Other synovitis and tenosynovitis, right  shoulder 05/01/2014  . Difficulty in walking 11/30/2013  . Extreme obesity 11/30/2013  . Morbid obesity ([place]) 11/30/2013  . Arthritis, degenerative 10/01/2013  . Steatohepatitis 10/01/2013  . Orthostasis 10/01/2013  . 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 Fall 09/26/2013  . Dizziness 09/07/2013  . Osteoporosis with fracture 09/06/2013  . Clinical depression 03/25/2012  . Adult hypothyroidism 03/25/2012  . Esophageal stenosis 07/03/2009  . Back ache 03/08/2003  . Anxiety state 12/27/2002    Past Surgical History:  Procedure Laterality Date  . APPENDECTOMY    . ESOPHAGOGASTRODUODENOSCOPY (EGD) WITH PROPOFOL N/A 12/20/2014   Procedure: ESOPHAGOGASTRODUODENOSCOPY (EGD) WITH PROPOFOL;  Surgeon: Manya Silvas, MD;  Location[facility] ENDOSCOPY;  Service: Endoscopy;  Laterality: N/A;  . SAVORY DILATION N/A 12/20/2014   Procedure: SAVORY DILATION;  Surgeon: Manya Silvas, MD;  Location: [facility] ENDOSCOPY;  Service: Endoscopy;  Laterality: N/A;  . TONSILLECTOMY      Prior to Admission medications   Medication Sig Start Date End Date Taking? Authorizing Provider  busPIRone (BUSPAR) 15 MG tablet Take 1 tablet (15 mg total) by mouth at bedtime. 09/22/17  Yes Eappen, Ria Clock, MD  fluticasone (FLONASE) 50 MCG/ACT nasal spray Place 2 sprays into both nostrils daily. 07/04/16  Yes Clapacs, Madie Reno, MD  omeprazole (PRILOSEC) 40 MG capsule Take 1 capsule (40 mg total) by mouth daily. 06/19/17 06/19/18 Yes Earleen Newport, MD  QUEtiapine (SEROQUEL) 100 MG tablet Take 1 tablet (100 mg total) by mouth at bedtime. 09/22/17  Yes Eappen, Ria Clock, MD  XIFAXAN 550 MG TABS tablet Take 1 tablet by mouth 2 (two)  times daily. 10/21/17  Yes [provider]  ondansetron (ZOFRAN ODT) 4 MG disintegrating tablet Take 1 tablet (4 mg total) by mouth every 8 (eight) hours as needed for nausea or vomiting. 06/19/17   Earleen Newport, MD    Allergies Sulfa antibiotics  Family History  Problem Relation Age of Onset    . Diabetes Mother   . COPD Mother   . Kidney disease Mother   . Depression Mother   . Heart attack Father   . Aneurysm Father   . Anxiety disorder Sister   . Depression Sister   . Diabetes Sister   . Hypertension Sister   . Atrial fibrillation Brother   . Spinal muscular atrophy Brother   . Breast cancer Sister   . Bone cancer Sister   . Depression Sister   . Anxiety disorder Sister   . Breast cancer Sister   . Colon cancer Sister   . Depression Sister   . Diabetes Sister   . COPD Sister   . Depression Sister   . Anxiety disorder Sister   . Diabetes Brother   . Depression Brother     Social History Social History   Tobacco Use  . Smoking status: Never Smoker  . Smokeless tobacco: Never Used  Substance Use Topics  . Alcohol use: No  . Drug use: No    Review of Systems  Constitutional: No fever/chills Eyes: No visual changes. ENT: No sore throat. Cardiovascular: Denies chest pain. Respiratory: Denies shortness of breath. Gastrointestinal: No diarrhea.  No constipation. Genitourinary: Negative for dysuria. Musculoskeletal: Negative for back pain. Skin: Negative for rash. Neurological: Negative for headaches, focal weakness or numbness.   ____________________________________________   PHYSICAL EXAM:  VITAL SIGNS: ED Triage Vitals  Enc Vitals Group     BP 11/20/17 1152 (!) 138/50     Pulse Rate 11/20/17 1152 81     Resp 11/20/17 1152 18     Temp 11/20/17 1152 98.9 F (37.2 C)     Temp Source 11/20/17 1152 Oral     SpO2 11/20/17 1152 91 %     Weight 11/20/17 1153 140 lb (63.5 kg)     Height 11/20/17 1153 5' (1.524 m)     Head Circumference --      Peak Flow --      Pain Score 11/20/17 1152 0     Pain Loc --      Pain Edu? --      Excl. in [place]? --     Constitutional: Alert and oriented. Well appearing and in no acute distress. Eyes: Conjunctivae are normal.  Head: Atraumatic. Nose: No congestion/rhinnorhea. Mouth/Throat: Mucous membranes are  moist.  Neck: No stridor.   Cardiovascular: Normal rate, regular rhythm. Grossly normal heart sounds.   Respiratory: Normal respiratory effort.  No retractions. Lungs CTAB. Gastrointestinal: Soft and nontender. No distention.  Musculoskeletal: No lower extremity tenderness nor edema.  No joint effusions. Neurologic:  Normal speech and language. No gross focal neurologic deficits are appreciated. Skin:  Skin is warm, dry and intact. No rash noted. Psychiatric: Mood and affect are normal. Speech and behavior are normal.  ____________________________________________   LABS (all labs ordered are listed, but only abnormal results are displayed)  Labs Reviewed  COMPREHENSIVE METABOLIC PANEL - Abnormal; Notable for the following components:      Result Value   Glucose, Bld 104 (*)    Calcium 8.6 (*)    Total Bilirubin 2.2 (*)    All other components  within normal limits  CBC - Abnormal; Notable for the following components:   RDW 17.8 (*)    Platelets 59 (*)    All other components within normal limits  URINALYSIS, COMPLETE (UACMP) WITH MICROSCOPIC - Abnormal; Notable for the following components:   Color, Urine AMBER (*)    APPearance CLEAR (*)    Hgb urine dipstick SMALL (*)    Ketones, ur 20 (*)    Protein, ur 30 (*)    All other components within normal limits  TROPONIN I  LIPASE, BLOOD  DIRECT PLATELET ANTIBODY   ____________________________________________  EKG  ED ECG REPORT I, Doran Stabler, the attending physician, personally viewed and interpreted this ECG.   Date: 11/20/2017  EKG Time: 1314  Rate: 77  Rhythm: normal sinus rhythm  Axis: Normal  Intervals:none  ST&T Change: No ST segment elevation or depression.  No abnormal T wave inversion.  ____________________________________________  RADIOLOGY   ____________________________________________   PROCEDURES  Procedure(s) performed:   Procedures  Critical Care performed:    ____________________________________________   INITIAL IMPRESSION / ASSESSMENT AND PLAN / ED COURSE  Pertinent labs & imaging results that were available during my care of the patient were reviewed by me and considered in my medical decision making (see chart for details).  Differential diagnosis includes, but is not limited to, biliary disease (biliary colic, acute cholecystitis, cholangitis, choledocholithiasis, etc), intrathoracic causes for epigastric abdominal pain including ACS, gastritis, duodenitis, pancreatitis, small bowel or large bowel obstruction, abdominal aortic aneurysm, hernia, and ulcer(s). As part of my medical decision making, I reviewed the following data within the electronic MEDICAL RECORD NUMBER Notes from prior ED visits  ----------------------------------------- 4:02 PM on 11/20/2017 -----------------------------------------  Patient at this time with Zofran at home as well as IV Zofran and now Phenergan here and still vomiting after drinking p.o. fluids.  Patient be admitted to the hospital.  Signed out to Dr. Jerelyn Charles.  Patient as well as family are understanding of the diagnosis as well as the treatment plan willing to comply.  Abdomen is reexamined and is still nontender. ____________________________________________   FINAL CLINICAL IMPRESSION(S) / ED DIAGNOSES  Final diagnoses:  Emesis, persistent      NEW MEDICATIONS STARTED DURING THIS VISIT:  New Prescriptions   No medications on file     Note:  This document was prepared using Dragon voice recognition software and may include unintentional dictation errors.     Orbie Pyo, MD 11/20/17 [phone]

## 2017-11-21 ENCOUNTER — Observation Stay: Payer: Medicare Other

## 2017-11-21 DIAGNOSIS — E860 Dehydration: Principal | ICD-10-CM

## 2017-11-21 DIAGNOSIS — K766 Portal hypertension: Secondary | ICD-10-CM | POA: Diagnosis present

## 2017-11-21 DIAGNOSIS — R161 Splenomegaly, not elsewhere classified: Secondary | ICD-10-CM | POA: Diagnosis present

## 2017-11-21 DIAGNOSIS — Z6827 Body mass index (BMI) 27.0-27.9, adult: Secondary | ICD-10-CM | POA: Diagnosis not present

## 2017-11-21 DIAGNOSIS — R112 Nausea with vomiting, unspecified: Secondary | ICD-10-CM | POA: Diagnosis present

## 2017-11-21 DIAGNOSIS — K219 Gastro-esophageal reflux disease without esophagitis: Secondary | ICD-10-CM | POA: Diagnosis present

## 2017-11-21 DIAGNOSIS — Z882 Allergy status to sulfonamides status: Secondary | ICD-10-CM

## 2017-11-21 DIAGNOSIS — Z841 Family history of disorders of kidney and ureter: Secondary | ICD-10-CM | POA: Diagnosis not present

## 2017-11-21 DIAGNOSIS — K760 Fatty (change of) liver, not elsewhere classified: Secondary | ICD-10-CM | POA: Diagnosis present

## 2017-11-21 DIAGNOSIS — R1310 Dysphagia, unspecified: Secondary | ICD-10-CM

## 2017-11-21 DIAGNOSIS — D696 Thrombocytopenia, unspecified: Secondary | ICD-10-CM

## 2017-11-21 DIAGNOSIS — F322 Major depressive disorder, single episode, severe without psychotic features: Secondary | ICD-10-CM

## 2017-11-21 DIAGNOSIS — Z79899 Other long term (current) drug therapy: Secondary | ICD-10-CM | POA: Diagnosis not present

## 2017-11-21 DIAGNOSIS — F419 Anxiety disorder, unspecified: Secondary | ICD-10-CM | POA: Diagnosis present

## 2017-11-21 DIAGNOSIS — F329 Major depressive disorder, single episode, unspecified: Secondary | ICD-10-CM | POA: Diagnosis present

## 2017-11-21 DIAGNOSIS — D693 Immune thrombocytopenic purpura: Secondary | ICD-10-CM | POA: Diagnosis present

## 2017-11-21 DIAGNOSIS — K222 Esophageal obstruction: Secondary | ICD-10-CM | POA: Diagnosis present

## 2017-11-21 DIAGNOSIS — Z803 Family history of malignant neoplasm of breast: Secondary | ICD-10-CM | POA: Diagnosis not present

## 2017-11-21 DIAGNOSIS — Z818 Family history of other mental and behavioral disorders: Secondary | ICD-10-CM | POA: Diagnosis not present

## 2017-11-21 DIAGNOSIS — E039 Hypothyroidism, unspecified: Secondary | ICD-10-CM

## 2017-11-21 DIAGNOSIS — Z8249 Family history of ischemic heart disease and other diseases of the circulatory system: Secondary | ICD-10-CM | POA: Diagnosis not present

## 2017-11-21 DIAGNOSIS — D72810 Lymphocytopenia: Secondary | ICD-10-CM | POA: Diagnosis not present

## 2017-11-21 DIAGNOSIS — Z833 Family history of diabetes mellitus: Secondary | ICD-10-CM | POA: Diagnosis not present

## 2017-11-21 DIAGNOSIS — E669 Obesity, unspecified: Secondary | ICD-10-CM | POA: Diagnosis present

## 2017-11-21 DIAGNOSIS — Z800 Family history of malignant neoplasm of digestive organs: Secondary | ICD-10-CM | POA: Diagnosis not present

## 2017-11-21 DIAGNOSIS — R1110 Vomiting, unspecified: Secondary | ICD-10-CM | POA: Diagnosis present

## 2017-11-21 DIAGNOSIS — Z825 Family history of asthma and other chronic lower respiratory diseases: Secondary | ICD-10-CM | POA: Diagnosis not present

## 2017-11-21 DIAGNOSIS — F0390 Unspecified dementia without behavioral disturbance: Secondary | ICD-10-CM | POA: Diagnosis present

## 2017-11-21 DIAGNOSIS — I959 Hypotension, unspecified: Secondary | ICD-10-CM | POA: Diagnosis not present

## 2017-11-21 LAB — TROPONIN I: Troponin I: <0.03 ng/mL (ref <0.03)

## 2017-11-21 LAB — CBC WITH DIFFERENTIAL/PLATELET
Basophils Absolute: 0 10*3/uL (ref 0–0.1)
Basophils Relative: 1 %
Eosinophils Absolute: 0.1 10*3/uL (ref 0–0.7)
Eosinophils Relative: 2 %
HCT: 38.7 % (ref 35.0–47.0)
Hemoglobin: 13 g/dL (ref 12.0–16.0)
Lymphocytes Relative: 16 %
Lymphs Abs: 0.6 10*3/uL — ABNORMAL LOW (ref 1.0–3.6)
MCH: 29.7 pg (ref 26.0–34.0)
MCHC: 33.6 g/dL (ref 32.0–36.0)
MCV: 88.5 fL (ref 80.0–100.0)
Monocytes Absolute: 0.3 10*3/uL (ref 0.2–0.9)
Monocytes Relative: 7 %
Neutro Abs: 2.8 10*3/uL (ref 1.4–6.5)
Neutrophils Relative %: 74 %
Platelets: 57 10*3/uL — ABNORMAL LOW (ref 150–440)
RBC: 4.37 MIL/uL (ref 3.80–5.20)
RDW: 17.9 % — ABNORMAL HIGH (ref 11.5–14.5)
WBC: 3.7 10*3/uL (ref 3.6–11.0)

## 2017-11-21 LAB — PROTIME-INR
INR: 1.5
Prothrombin Time: 18 seconds — ABNORMAL HIGH (ref 11.4–15.2)

## 2017-11-21 LAB — APTT: aPTT: 47 seconds — ABNORMAL HIGH (ref 24–36)

## 2017-11-21 LAB — FOLATE: Folate: 8.3 ng/mL (ref >5.9)

## 2017-11-21 LAB — VITAMIN B12: Vitamin B-12: 622 pg/mL (ref 180–914)

## 2017-11-21 LAB — T4, FREE: Free T4: 0.8 ng/dL — ABNORMAL LOW (ref 0.82–1.77)

## 2017-11-21 MED ORDER — TRAZODONE HCL 50 MG PO TABS
150.0000 mg | ORAL_TABLET | Freq: Every day | ORAL | Status: DC
  Administered 2017-11-21 – 2017-11-23 (×3): 150 mg via ORAL
  Filled 2017-11-21 (×3): qty 3

## 2017-11-21 MED ORDER — LEVOTHYROXINE SODIUM 50 MCG PO TABS
75.0000 ug | ORAL_TABLET | Freq: Every day | ORAL | Status: DC
  Administered 2017-11-21: 75 ug via ORAL
  Filled 2017-11-21 (×2): qty 1

## 2017-11-21 NOTE — Plan of Care (Signed)
  Problem: Education: Goal: Knowledge of General Education information will improve Description Including pain rating scale, medication(s)/side effects and non-pharmacologic comfort measures Outcome: Progressing   Problem: Clinical Measurements: Goal: Ability to maintain clinical measurements within normal limits will improve Outcome: Progressing Goal: Diagnostic test results will improve Outcome: Progressing   Problem: Pain Managment: Goal: General experience of comfort will improve Outcome: Progressing

## 2017-11-21 NOTE — Progress Notes (Signed)
[facility] NAME: Cassidy [name]    MR#:  [id number]  DATE OF BIRTH:  07/03/1940  SUBJECTIVE:  CHIEF COMPLAINT:   Chief Complaint  Patient presents with  . Dehydration  . Emesis  not able to eat/drink per sister (she is worried about eso. Stenosis) - unsure if this is true dysphagia, reports vomiting at home although none here (she's been NPO) REVIEW OF SYSTEMS:  Review of Systems  Constitutional: Negative for diaphoresis, fever, malaise/fatigue and weight loss.  HENT: Negative for ear discharge, ear pain, hearing loss, nosebleeds, sore throat and tinnitus.   Eyes: Negative for blurred vision and pain.  Respiratory: Negative for cough, hemoptysis, shortness of breath and wheezing.   Cardiovascular: Negative for chest pain, palpitations, orthopnea and leg swelling.  Gastrointestinal: Positive for nausea and vomiting. Negative for abdominal pain, blood in stool, constipation, diarrhea and heartburn.  Genitourinary: Negative for dysuria, frequency and urgency.  Musculoskeletal: Negative for back pain and myalgias.  Skin: Negative for itching and rash.  Neurological: Negative for dizziness, tingling, tremors, focal weakness, seizures, weakness and headaches.  Psychiatric/Behavioral: Positive for depression. The patient is not nervous/anxious.    DRUG ALLERGIES:   Allergies  Allergen Reactions  . Sulfa Antibiotics Shortness Of Breath   VITALS:  Blood pressure 131/67, pulse 83, temperature 98.9 F (37.2 C), temperature source Oral, resp. rate 18, height 5' (1.524 m), weight 63.5 kg (140 lb), SpO2 93 %. PHYSICAL EXAMINATION:  Physical Exam  Constitutional: She is oriented to person, place, and time.  HENT:  Head: Normocephalic and atraumatic.  Eyes: Pupils are equal, round, and reactive to light. Conjunctivae and EOM are normal.  Neck: Normal range of motion. Neck supple. No tracheal deviation present. No thyromegaly present.    Cardiovascular: Normal rate, regular rhythm and normal heart sounds.  Pulmonary/Chest: Effort normal and breath sounds normal. No respiratory distress. She has no wheezes. She exhibits no tenderness.  Abdominal: Soft. Bowel sounds are normal. She exhibits no distension. There is no tenderness.  Musculoskeletal: Normal range of motion.  Neurological: She is alert and oriented to person, place, and time. No cranial nerve deficit.  Skin: Skin is warm and dry. No rash noted.   LABORATORY PANEL:  Female CBC Recent Labs  Lab 11/21/17 0335  WBC 3.7  HGB 13.0  HCT 38.7  PLT 57*   ------------------------------------------------------------------------------------------------------------------ Chemistries  Recent Labs  Lab 11/20/17 1158  NA 141  K 3.7  CL 108  CO2 26  GLUCOSE 104*  BUN 14  CREATININE 0.54  CALCIUM 8.6*  AST 32  ALT 16  ALKPHOS 79  BILITOT 2.2*   RADIOLOGY:  Dg Abd 2 Views  Result Date: 11/20/2017 CLINICAL DATA:  Dehydration, vomiting since yesterday EXAM: ABDOMEN - 2 VIEW COMPARISON:  06/19/2017 FINDINGS: RIGHT basilar opacity question combination of effusion and atelectasis. Nonobstructive bowel gas pattern. No evidence of bowel obstruction or dilatation. Questionable mild wall thickening of the rectum. Bones demineralized with dextroconvex scoliosis and suspect superior endplate compression deformity of L2 vertebral body. No urinary tract calcification. IMPRESSION: Nonobstructive bowel gas pattern. Questionable mild rectal wall thickening, recommend correlation with proctoscopy. RIGHT lung base opacity suspect pleural effusion and basilar atelectasis. Electronically Signed   By: Lavonia Dana M.D.   On: 11/20/2017 16:29   ASSESSMENT AND PLAN:  27  y f with h/o anxiety/depression admitted for   *Acute recurrent persistent nausea/emesis Exact etiology is unknown Without diarrhea/abdominal pain, noted history of esophageal  stricture and GERD - observation, IV fluids  for rehydration, antiemetics PRN, Pending abdominal US for further evaluation, start ice chips/ meds diet for now and advance as tolerated, speech therapy to evaluate/treat, consult gastroenterology for further evaluation as sister is concerned for stenosis - I ordered MBS but received call back that they can't do this over the weekend (Radiologist has to come in and so do ST) unless it's an emergency  *Acute dehydration  IV fluids for rehydration  *Acute on Chronic thrombocytopenia: platelets 57 (was 73 y'day) Etiology unknown - ?Liver dz Follow-up on abdominal ultrasound, oncology to see, check for platelet antibodies  *Chronic GERD without esophagitis  PPI daily  *Chronic hypothyroidism: TSH high, 6.2 (Unsure compliance) - continue Synthroid at current dose, check Free T3,T4  *History of esophageal stricture - GI c/s - I ordered MBS but received call back that they can't do this over the weekend (Radiologist has to come in and so do ST) unless it's an emergency  *Chronic dementia: followed by Dr Melrose Nakayama [facility] neuro) Stable continue home psychotropic regiment Increase nursing care PRN, aspiration/fall/skin care precautions while in house       All the records are reviewed and case discussed with Care Management/Social Worker. Management plans discussed with the patient, family (sister at bedside) and they are in agreement.  CODE STATUS: Full Code  TOTAL TIME TAKING CARE OF THIS PATIENT: 35 minutes.   More than 50% of the time was spent in counseling/coordination of care: YES  POSSIBLE D/C IN 1-2 DAYS, DEPENDING ON CLINICAL CONDITION.   Max Sane M.D on 11/21/2017 at 8:01 AM  Between 7am to 6pm - Pager - [phone]  After 6pm go to [email or web address] - Technical brewer [facility]  Office  [phone]  CC: Primary care physician; Kirk Ruths, MD  Note: This dictation was prepared with Dragon dictation along with smaller  phrase technology. Any transcriptional errors that result from this process are unintentional.

## 2017-11-21 NOTE — Consult Note (Signed)
[facility]  Date of admission:  11/20/2017  Inpatient day:  11/21/2017  Consulting physician:  Dr. Holly Bodily Salary.   Reason for Consultation:  Acute on chronic thrombocytopenia.  Chief Complaint: Cassidy [name] is a 77 y.o. female with chronic thrombocytopenia and severe depression who was admitted through the emergency room with nausea, vomiting and dehydration.  HPI:  History is provided by the patient's sister.  Patient had no problems per her sister until approximately 2-1/2 years ago when she stopped eating after her daughter passed away.  She has gone "downhill" since that time.  She does not want to eat or drink.  She is lost a significant amount of weight.  She has been followed by psychiatry.  At some point in the past she underwent ECT therapy when hospitalized for depression.  She made some improvement.  She has been on lifting and amount of psychiatric medications up.  Sister notes that at one point he was on "high-dose" medications.  Subsequently, they were sdecreased.  Currently she is on Buspar, Lexapro, and Trazadone.  She does not take any herbal products except for melatonin.  She has a long-standing history of thrombocytopenia.  Etiology is unclear.  Her sister notes no apparent work-up.  Labs dating back to 07/25/2013 revealed a platelet count ranging between 57,000 and 103,000.  Hematocrit has been normal.  She has had a periodic low white count.  Lymphocyte count has been low.  ALC has ranged between 400 - 600 in the past year.  She has had no issues with infections.  She bruises easily.  She apparently has some underlying liver disease.  She has no history of hepatitis.  She has been on Xifaxan for an elevated ammonia.  Labs on admission have included a hematocrit of 38.7, hemoglobin 13, MCV 88.5, platelets 57,000, white count 3700 with an ANC of 2800.  Absolute lymphocyte count is 600.  B12 is 622.  Folate is 8.3.  TSH is 6.287 (0.35 - 4.5).  Free T4 0.80  (0.82 - 1.77) .  PTT is 47 (24 - 36).    Past Medical History:  Diagnosis Date  . Anxiety   . Depression   . Fatty liver   . GERD (gastroesophageal reflux disease)   . Hypothyroidism   . Obesity   . Thyroid disease     Past Surgical History:  Procedure Laterality Date  . APPENDECTOMY    . ESOPHAGOGASTRODUODENOSCOPY (EGD) WITH PROPOFOL N/A 12/20/2014   Procedure: ESOPHAGOGASTRODUODENOSCOPY (EGD) WITH PROPOFOL;  Surgeon: Manya Silvas, MD;  Location: [facility] ENDOSCOPY;  Service: Endoscopy;  Laterality: N/A;  . SAVORY DILATION N/A 12/20/2014   Procedure: SAVORY DILATION;  Surgeon: Manya Silvas, MD;  Location: [facility] ENDOSCOPY;  Service: Endoscopy;  Laterality: N/A;  . TONSILLECTOMY      Family History  Problem Relation Age of Onset  . Diabetes Mother   . COPD Mother   . Kidney disease Mother   . Depression Mother   . Heart attack Father   . Aneurysm Father   . Anxiety disorder Sister   . Depression Sister   . Diabetes Sister   . Hypertension Sister   . Atrial fibrillation Brother   . Spinal muscular atrophy Brother   . Breast cancer Sister   . Bone cancer Sister   . Depression Sister   . Anxiety disorder Sister   . Breast cancer Sister   . Colon cancer Sister   . Depression Sister   . Diabetes Sister   .  COPD Sister   . Depression Sister   . Anxiety disorder Sister   . Diabetes Brother   . Depression Brother     Social History:  reports that she has never smoked. She has never used smokeless tobacco. She reports that she does not drink alcohol or use drugs.  The patient is accompanied by the patient's sister and grandson.  Allergies:  Allergies  Allergen Reactions  . Sulfa Antibiotics Shortness Of Breath    Medications Prior to Admission  Medication Sig Dispense Refill  . acidophilus (RISAQUAD) CAPS capsule Take 1 capsule by mouth 2 (two) times daily.    . busPIRone (BUSPAR) 15 MG tablet Take 1 tablet (15 mg total) by mouth at bedtime. (Patient taking  differently: Take 15 mg by mouth 2 (two) times daily. ) 30 tablet 4  . escitalopram (LEXAPRO) 20 MG tablet Take 10 mg by mouth at bedtime.    . fluticasone (FLONASE) 50 MCG/ACT nasal spray Place 2 sprays into both nostrils daily. 16 g 2  . levothyroxine (SYNTHROID, LEVOTHROID) 75 MCG tablet Take 75 mcg by mouth daily before breakfast.    . Melatonin 1 MG TABS Take 5 mg by mouth at bedtime as needed.    Marland Kitchen omeprazole (PRILOSEC) 40 MG capsule Take 1 capsule (40 mg total) by mouth daily. 30 capsule 1  . ondansetron (ZOFRAN ODT) 4 MG disintegrating tablet Take 1 tablet (4 mg total) by mouth every 8 (eight) hours as needed for nausea or vomiting. 20 tablet 0  . potassium chloride (K-DUR) 10 MEQ tablet Take 10 mEq by mouth daily.    . QUEtiapine (SEROQUEL) 100 MG tablet Take 1 tablet (100 mg total) by mouth at bedtime. 30 tablet 4  . traZODone (DESYREL) 150 MG tablet Take 200 mg by mouth at bedtime.    Marland Kitchen XIFAXAN 550 MG TABS tablet Take 1 tablet by mouth 2 (two) times daily.      Review of Systems: GENERAL:  Feels good.  Active.  No fevers, sweats or weight loss. PERFORMANCE STATUS (ECOG):  2 HEENT:  No visual changes, runny nose, sore throat, mouth sores or tenderness. Lungs: No shortness of breath or cough.  No hemoptysis. Cardiac:  No chest pain, palpitations, orthopnea, or PND. GI:  1-2 days of emesis.  Poor appetite x 2 1/2 years.  h/o esophageal stricture.  No diarrhea, constipation, melena or hematochezia. GU:  No urgency, frequency, dysuria, or hematuria. Musculoskeletal:  No back pain.  No joint pain.  No muscle tenderness. Extremities:  No pain or swelling. Skin:  Easy bruising.  No rashes or skin changes. Neuro:  No headache, numbness or weakness, balance or coordination issues. Endocrine:  No diabetes.  Thyroid disease on Synthroid.  No hot flashes or night sweats. Psych:  Depression.  Sees psychiatry. s/p ECT.  Change in mood after death of her daughter 2 1/2 years ago. Pain:  No  focal pain. Review of systems:  All other systems reviewed and found to be negative.  Physical Exam:  Blood pressure 131/67, pulse 83, temperature 98.9 F (37.2 C), temperature source Oral, resp. rate 18, height 5' (1.524 m), weight 140 lb (63.5 kg), SpO2 93 %.  GENERAL:  Chronically fatigued appearing woman lying comfortably on the medical unit in no acute distress.  She defers most of the questions to her sister. MENTAL STATUS:  Alert and interactive. HEAD:  Short styled blonde hair.  Normocephalic, atraumatic, face symmetric, no Cushingoid features. EYES:  Blue eyes.  Pupils equal round  and reactive to light and accomodation.  No conjunctivitis or scleral icterus. ENT:  Oropharynx clear without lesion.  Tongue normal. Mucous membranes moist.  RESPIRATORY:  Clear to auscultation without rales, wheezes or rhonchi. CARDIOVASCULAR:  Regular rate and rhythm without murmur, rub or gallop. ABDOMEN:  Soft, non-tender, with active bowel sounds, and no hepatosplenomegaly.  No masses. SKIN:  No rashes, ulcers or lesions. EXTREMITIES: No edema, no skin discoloration or tenderness.  No palpable cords. LYMPH NODES: No palpable cervical, supraclavicular, axillary or inguinal adenopathy  NEUROLOGICAL: Unremarkable. PSYCH:  Somewhat withdrawn.     Results for orders placed or performed during the hospital encounter of 11/20/17 (from the past 48 hour(s))  Comprehensive metabolic panel     Status: Abnormal   Collection Time: 11/20/17 11:58 AM  Result Value Ref Range   Sodium 141 135 - 145 mmol/L   Potassium 3.7 3.5 - 5.1 mmol/L   Chloride 108 98 - 111 mmol/L   CO2 26 22 - 32 mmol/L   Glucose, Bld 104 (H) 70 - 99 mg/dL   BUN 14 8 - 23 mg/dL   Creatinine, Ser 0.54 0.44 - 1.00 mg/dL   Calcium 8.6 (L) 8.9 - 10.3 mg/dL   Total Protein 7.0 6.5 - 8.1 g/dL   Albumin 3.5 3.5 - 5.0 g/dL   AST 32 15 - 41 U/L   ALT 16 0 - 44 U/L   Alkaline Phosphatase 79 38 - 126 U/L   Total Bilirubin 2.2 (H) 0.3 - 1.2  mg/dL   GFR calc non Af Amer >60 >60 mL/min   GFR calc Af Amer >60 >60 mL/min    Comment: (NOTE) The eGFR has been calculated using the CKD EPI equation. This calculation has not been validated in all clinical situations. eGFR's persistently <60 mL/min signify possible Chronic Kidney Disease.    Anion gap 7 5 - 15    Comment: Performed at [facility], [address] [place], [place] [address]  CBC     Status: Abnormal   Collection Time: 11/20/17 11:58 AM  Result Value Ref Range   WBC 3.6 3.6 - 11.0 K/uL   RBC 4.61 3.80 - 5.20 MIL/uL   Hemoglobin 13.7 12.0 - 16.0 g/dL   HCT 40.5 35.0 - 47.0 %   MCV 87.8 80.0 - 100.0 fL   MCH 29.7 26.0 - 34.0 pg   MCHC 33.8 32.0 - 36.0 g/dL   RDW 17.8 (H) 11.5 - 14.5 %   Platelets 59 (L) 150 - 440 K/uL    Comment: Performed at [facility], [address] [place], [place] [address]  Troponin I     Status: None   Collection Time: 11/20/17 11:58 AM  Result Value Ref Range   Troponin I <0.03 <0.03 ng/mL    Comment: Performed at [facility], [address] [place], [place] [address]  Lipase, blood     Status: None   Collection Time: 11/20/17 11:58 AM  Result Value Ref Range   Lipase 32 11 - 51 U/L    Comment: Performed at [facility], [address] [place], [place] [address]  Urinalysis, Complete w Microscopic     Status: Abnormal   Collection Time: 11/20/17  1:14 PM  Result Value Ref Range   Color, Urine AMBER (A) YELLOW    Comment: BIOCHEMICALS MAY BE AFFECTED BY COLOR   APPearance CLEAR (A) CLEAR   Specific Gravity, Urine 1.020 1.005 - 1.030   pH 6.0 5.0 - 8.0  Glucose, UA NEGATIVE NEGATIVE mg/dL   Hgb urine dipstick SMALL (A) NEGATIVE   Bilirubin Urine NEGATIVE NEGATIVE   Ketones, ur 20 (A) NEGATIVE mg/dL   Protein, ur 30 (A) NEGATIVE mg/dL   Nitrite NEGATIVE NEGATIVE   Leukocytes, UA NEGATIVE NEGATIVE   RBC / HPF 6-10 0 - 5 RBC/hpf   WBC, UA 0-5 0 - 5 WBC/hpf   Bacteria, UA NONE SEEN  NONE SEEN   Squamous Epithelial / LPF 0-5 0 - 5   Mucus PRESENT     Comment: Performed at [facility], [address] [place], [place] [address]  TSH     Status: Abnormal   Collection Time: 11/20/17  5:05 PM  Result Value Ref Range   TSH 6.287 (H) 0.350 - 4.500 uIU/mL    Comment: Performed by a 3rd Generation assay with a functional sensitivity of <=0.01 uIU/mL. Performed at [facility], [address] [place], [place] [address]   Troponin I     Status: None   Collection Time: 11/20/17  5:05 PM  Result Value Ref Range   Troponin I <0.03 <0.03 ng/mL    Comment: Performed at [facility], [address] [place], [place] [address]  Troponin I     Status: None   Collection Time: 11/20/17 10:34 PM  Result Value Ref Range   Troponin I <0.03 <0.03 ng/mL    Comment: Performed at [facility], [address] [place], [place] [address]  Troponin I     Status: None   Collection Time: 11/21/17  3:35 AM  Result Value Ref Range   Troponin I <0.03 <0.03 ng/mL    Comment: Performed at [facility], [address] [place], [place] [address]  Folate     Status: None   Collection Time: 11/21/17  3:35 AM  Result Value Ref Range   Folate 8.3 >5.9 ng/mL    Comment: Performed at [facility], [address] [place], [place] [address]  APTT     Status: Abnormal   Collection Time: 11/21/17  3:35 AM  Result Value Ref Range   aPTT 47 (H) 24 - 36 seconds    Comment:        IF BASELINE aPTT IS ELEVATED, SUGGEST PATIENT RISK ASSESSMENT BE USED TO DETERMINE APPROPRIATE ANTICOAGULANT THERAPY. Performed at [facility], [address] [place], [place] [address]   Protime-INR     Status: Abnormal   Collection Time: 11/21/17  3:35 AM  Result Value Ref Range   Prothrombin Time 18.0 (H) 11.4 - 15.2 seconds   INR 1.50     Comment: Performed at [facility], [place], [place] [address]  CBC with  Differential/Platelet     Status: Abnormal   Collection Time: 11/21/17  3:35 AM  Result Value Ref Range   WBC 3.7 3.6 - 11.0 K/uL   RBC 4.37 3.80 - 5.20 MIL/uL   Hemoglobin 13.0 12.0 - 16.0 g/dL   HCT 38.7 35.0 - 47.0 %   MCV 88.5 80.0 - 100.0 fL   MCH 29.7 26.0 - 34.0 pg   MCHC 33.6 32.0 - 36.0 g/dL   RDW 17.9 (H) 11.5 - 14.5 %   Platelets 57 (L) 150 - 440 K/uL   Neutrophils Relative % 74 %   Neutro Abs 2.8 1.4 - 6.5 K/uL   Lymphocytes Relative 16 %   Lymphs Abs 0.6 (L) 1.0 - 3.6 K/uL   Monocytes Relative 7 %  Monocytes Absolute 0.3 0.2 - 0.9 K/uL   Eosinophils Relative 2 %   Eosinophils Absolute 0.1 0 - 0.7 K/uL   Basophils Relative 1 %   Basophils Absolute 0.0 0 - 0.1 K/uL    Comment: Performed at [facility], [address], [place], [place] [address]   US Abdomen Complete  Result Date: 11/21/2017 CLINICAL DATA:  Persistent vomiting. Acute on chronic thrombocytopenia. History of fatty liver with elevated total bilirubin. EXAM: ABDOMEN ULTRASOUND COMPLETE COMPARISON:  Ultrasound 07/29/2013 FINDINGS: Gallbladder: No gallstones or wall thickening visualized. No sonographic Murphy sign noted by sonographer. Common bile duct: Diameter: 5 mm Liver: No focal lesion identified. Within normal limits in parenchymal echogenicity. Portal vein is patent on color Doppler imaging with normal direction of blood flow towards the liver. The portal vein is mildly distended with a diameter of 1.3 cm. IVC: No abnormality visualized. Pancreas: Visualized portion unremarkable. Spleen: Progressive enlargement, measuring 14.5 x 7.5 x 13.1 cm (volume = 750 cm^3). No focal abnormality. Right Kidney: Length: 10.8 cm. Mild renal cortical thinning and prominence of the renal sinus fat. No hydronephrosis. Left Kidney: Length: 11.2 cm. Mild renal cortical thinning and prominence of the renal sinus fat. No hydronephrosis. Abdominal aorta: No aneurysm visualized. Other findings: Small right pleural effusion  noted. No significant ascites. IMPRESSION: 1. No evidence of hepatic or biliary disease. 2. Dilated portal vein and splenomegaly, suggesting portal hypertension. 3. Mild renal cortical thinning bilaterally. Electronically Signed   By: Richardean Sale M.D.   On: 11/21/2017 08:06   Dg Abd 2 Views  Result Date: 11/20/2017 CLINICAL DATA:  Dehydration, vomiting since yesterday EXAM: ABDOMEN - 2 VIEW COMPARISON:  06/19/2017 FINDINGS: RIGHT basilar opacity question combination of effusion and atelectasis. Nonobstructive bowel gas pattern. No evidence of bowel obstruction or dilatation. Questionable mild wall thickening of the rectum. Bones demineralized with dextroconvex scoliosis and suspect superior endplate compression deformity of L2 vertebral body. No urinary tract calcification. IMPRESSION: Nonobstructive bowel gas pattern. Questionable mild rectal wall thickening, recommend correlation with proctoscopy. RIGHT lung base opacity suspect pleural effusion and basilar atelectasis. Electronically Signed   By: Lavonia Dana M.D.   On: 11/20/2017 16:29    Assessment:  The patient is a 77 y.o. woman with chronic thrombocytopenia admitted with nausea, vomiting, and dehydration.  She has mild lymphopenia.  Diet is poor.  No medications are implicated.  She is on no herbal products.  HIV testing in 06/2017 was negative.  Abdominal ultrasound on 11/21/2017 revealed a 14.5 x 7.5 cm x 13.1 cm spleen (volume 750 cm3) with mild distention of the portal vein.  Symptomatically, she is somewhat withdrawn.  She has struggled with depression since the death of her daughter 2 1/2 years ago.  Exam is unremarkable.  Plan:   1.  Hematology:  Etiology of thrombocytopenia unclear.  Likely due to poor nutrition.  Spleen slightly enlarged due to portal hypertension.  Labs sent including: hepatitis B and C, ANA, folate.  B12 is normal.  She has mild hypothyroidism on supplementation.  PTT slightly elevated as patient on heparin for DVT  prophylaxis.  Await platelet count in a blue top tube to r/o pseudo-thrombocytopenia.  She has mild lymphocytopenia likely due to nutritional difficiency.  HIV testing negative in 06/2017.  Zinc and copper levels ordered.    2.  Nutrition:  Patient may benefit from nutrition consult.   Thank you for allowing me to participate in SHEMIA BEVEL 's care.  I will follow her closely  with you while hospitalized and after discharge in the outpatient department.   Lequita Asal, MD  11/21/2017, 9:55 AM

## 2017-11-21 NOTE — Consult Note (Addendum)
Cassidy [name] , MD [address] [place], [place], [place], [address] [address], [place], [place], [place], [address] Phone: [phone]  Fax: [phone]  Consultation  Referring Provider:  Dr Cassidy [name]  Primary Care Physician:  Cassidy Ruths, MD Primary Gastroenterologist:  Dr. Tiffany [name]          Reason for Consultation:     Esopahgeal stenosis  Date of Admission:  11/20/2017 Date of Consultation:  11/21/2017         HPI:   Cassidy [name] is a 77 y.o. female admitted on 11/20/2017 for dehydration.  Apparently she had decreased p.o. intake nausea and emesis since yesterday.  Apparently she had an EGD by Dr. Vira [name] 2 years back and had a stricture dilated.  I do note as per our records she had an EGD in 2016 and the Schatzki's ring was dilated.  I have been consulted to inquire whether a repeat dilation is needed at this point.  She had a abdominal x-ray yesterday which showed mild rectal wall thickening and recommend correlation proctoscopy.  Right lung base opacity suspect pleural effusion and bibasilar atelectasis.  When I went into her room at 12:45 PM she was at the family member and she had completed eating her entire tray of food including the popsicle.  She appeared to be completely at ease and had no problems swallowing.  She said that yesterday for a short period of time she felt a tight pressure in the chest and food was not going down.  She says that this has occurred on 4 occasions in the past.  She denies any lower GI symptoms such as rectal bleeding change in bowel habits.  Past Medical History:  Diagnosis Date  . Anxiety   . Depression   . Fatty liver   . GERD (gastroesophageal reflux disease)   . Hypothyroidism   . Obesity   . Thyroid disease     Past Surgical History:  Procedure Laterality Date  . APPENDECTOMY    . ESOPHAGOGASTRODUODENOSCOPY (EGD) WITH PROPOFOL N/A 12/20/2014   Procedure: ESOPHAGOGASTRODUODENOSCOPY (EGD) WITH PROPOFOL;  Surgeon: Cassidy Silvas, MD;  Location: [facility] ENDOSCOPY;  Service: Endoscopy;  Laterality: N/A;  . SAVORY DILATION N/A 12/20/2014   Procedure: SAVORY DILATION;  Surgeon: Cassidy Silvas, MD;  Location: [facility] ENDOSCOPY;  Service: Endoscopy;  Laterality: N/A;  . TONSILLECTOMY      Prior to Admission medications   Medication Sig Start Date End Date Taking? Authorizing Provider  acidophilus (RISAQUAD) CAPS capsule Take 1 capsule by mouth 2 (two) times daily.   Yes [provider]  busPIRone (BUSPAR) 15 MG tablet Take 1 tablet (15 mg total) by mouth at bedtime. Patient taking differently: Take 15 mg by mouth 2 (two) times daily.  09/22/17  Yes Cassidy [name], Cassidy Clock, MD  escitalopram (LEXAPRO) 20 MG tablet Take 10 mg by mouth at bedtime.   Yes [provider]  fluticasone (FLONASE) 50 MCG/ACT nasal spray Place 2 sprays into both nostrils daily. 07/04/16  Yes Cassidy [name], Cassidy Reno, MD  levothyroxine (SYNTHROID, LEVOTHROID) 75 MCG tablet Take 75 mcg by mouth daily before breakfast.   Yes [provider]  Melatonin 1 MG TABS Take 5 mg by mouth at bedtime as needed.   Yes [provider]  omeprazole (PRILOSEC) 40 MG capsule Take 1 capsule (40 mg total) by mouth daily. 06/19/17 06/19/18 Yes Cassidy Newport, MD  ondansetron (ZOFRAN ODT) 4 MG disintegrating tablet Take 1 tablet (4 mg total) by  mouth every 8 (eight) hours as needed for nausea or vomiting. 06/19/17  Yes Cassidy Newport, MD  potassium chloride (K-DUR) 10 MEQ tablet Take 10 mEq by mouth daily.   Yes [provider]  QUEtiapine (SEROQUEL) 100 MG tablet Take 1 tablet (100 mg total) by mouth at bedtime. 09/22/17  Yes Cassidy Alert, MD  traZODone (DESYREL) 150 MG tablet Take 200 mg by mouth at bedtime.   Yes [provider]  XIFAXAN 550 MG TABS tablet Take 1 tablet by mouth 2 (two) times daily. 10/21/17  Yes [provider]    Family History  Problem Relation Age of Onset  . Diabetes Mother   . COPD Mother   .  Kidney disease Mother   . Depression Mother   . Heart attack Father   . Aneurysm Father   . Anxiety disorder Sister   . Depression Sister   . Diabetes Sister   . Hypertension Sister   . Atrial fibrillation Brother   . Spinal muscular atrophy Brother   . Breast cancer Sister   . Bone cancer Sister   . Depression Sister   . Anxiety disorder Sister   . Breast cancer Sister   . Colon cancer Sister   . Depression Sister   . Diabetes Sister   . COPD Sister   . Depression Sister   . Anxiety disorder Sister   . Diabetes Brother   . Depression Brother      Social History   Tobacco Use  . Smoking status: Never Smoker  . Smokeless tobacco: Never Used  Substance Use Topics  . Alcohol use: No  . Drug use: No    Allergies as of 11/20/2017 - Review Complete 11/20/2017  Allergen Reaction Noted  . Sulfa antibiotics Shortness Of Breath 11/27/2014    Review of Systems:    All systems reviewed and negative except where noted in HPI.   Physical Exam:  Vital signs in last 24 hours: Temp:  [98.4 F (36.9 C)-99 F (37.2 C)] 98.9 F (37.2 C) (08/03 0422) Pulse Rate:  [74-83] 83 (08/03 0422) Resp:  [17-25] 18 (08/03 0422) BP: (122-148)/(50-82) 131/67 (08/03 0422) SpO2:  [91 %-97 %] 93 % (08/03 0422) Weight:  [140 lb (63.5 kg)] 140 lb (63.5 kg) (08/02 1153) Last BM Date: (unknown) General:   Pleasant, cooperative in NAD Head:  Normocephalic and atraumatic. Eyes:   No icterus.   Conjunctiva pink. PERRLA. Ears:  Normal auditory acuity. Neck:  Supple; no masses or thyroidomegaly Lungs: Respirations even and unlabored. Lungs clear to auscultation bilaterally.   No wheezes, crackles, or rhonchi.  Heart:  Regular rate and rhythm; systolic murmur, no clicks, rubs or gallops Abdomen:  Soft, nondistended, nontender. Normal bowel sounds. No appreciable masses or hepatomegaly.  No rebound or guarding.  Neurologic:  [name] and oriented x3;  grossly normal neurologically. Skin:  Intact without  significant lesions or rashes. Cervical Nodes:  No significant cervical adenopathy. Psych:  [name] and cooperative. Normal affect.  LAB RESULTS: Recent Labs    11/20/17 1158 11/21/17 0335  WBC 3.6 3.7  HGB 13.7 13.0  HCT 40.5 38.7  PLT 59* 57*   BMET Recent Labs    11/20/17 1158  NA 141  K 3.7  CL 108  CO2 26  GLUCOSE 104*  BUN 14  CREATININE 0.54  CALCIUM 8.6*   LFT Recent Labs    11/20/17 1158  PROT 7.0  ALBUMIN 3.5  AST 32  ALT 16  ALKPHOS 79  BILITOT 2.2*   PT/INR Recent Labs    11/21/17 0335  LABPROT 18.0*  INR 1.50    STUDIES: US Abdomen Complete  Result Date: 11/21/2017 CLINICAL DATA:  Persistent vomiting. Acute on chronic thrombocytopenia. History of fatty liver with elevated total bilirubin. EXAM: ABDOMEN ULTRASOUND COMPLETE COMPARISON:  Ultrasound 07/29/2013 FINDINGS: Gallbladder: No gallstones or wall thickening visualized. No sonographic Murphy sign noted by sonographer. Common bile duct: Diameter: 5 mm Liver: No focal lesion identified. Within normal limits in parenchymal echogenicity. Portal vein is patent on color Doppler imaging with normal direction of blood flow towards the liver. The portal vein is mildly distended with a diameter of 1.3 cm. IVC: No abnormality visualized. Pancreas: Visualized portion unremarkable. Spleen: Progressive enlargement, measuring 14.5 x 7.5 x 13.1 cm (volume = 750 cm^3). No focal abnormality. Right Kidney: Length: 10.8 cm. Mild renal cortical thinning and prominence of the renal sinus fat. No hydronephrosis. Left Kidney: Length: 11.2 cm. Mild renal cortical thinning and prominence of the renal sinus fat. No hydronephrosis. Abdominal aorta: No aneurysm visualized. Other findings: Small right pleural effusion noted. No significant ascites. IMPRESSION: 1. No evidence of hepatic or biliary disease. 2. Dilated portal vein and splenomegaly, suggesting portal hypertension. 3. Mild renal cortical thinning bilaterally.  Electronically Signed   By: Cassidy [name] M.D.   On: 11/21/2017 08:06   Dg Abd 2 Views  Result Date: 11/20/2017 CLINICAL DATA:  Dehydration, vomiting since yesterday EXAM: ABDOMEN - 2 VIEW COMPARISON:  06/19/2017 FINDINGS: RIGHT basilar opacity question combination of effusion and atelectasis. Nonobstructive bowel gas pattern. No evidence of bowel obstruction or dilatation. Questionable mild wall thickening of the rectum. Bones demineralized with dextroconvex scoliosis and suspect superior endplate compression deformity of L2 vertebral body. No urinary tract calcification. IMPRESSION: Nonobstructive bowel gas pattern. Questionable mild rectal wall thickening, recommend correlation with proctoscopy. RIGHT lung base opacity suspect pleural effusion and basilar atelectasis. Electronically Signed   By: Cassidy [name] M.D.   On: 11/20/2017 16:29    BP 131/67 (BP Location: Right Arm)   Pulse 83   Temp 98.9 F (37.2 C) (Oral)   Resp 18   Ht 5' (1.524 m)   Wt 140 lb (63.5 kg)   LMP  (LMP Unknown)   SpO2 93%   BMI 27.34 kg/m    Impression / Plan:   LENOLA LOCKNER is a 77 y.o. y/o female with dementia and a prior history of a Schatzki's ring that was dilated by Dr. Vira [name] in 2016.  She presents to the hospital with dehydration decreased p.o. intake.  I have been consulted to inquire if she is a would require dilation of her esophageal stricture.  I do note an x-ray of the abdomen is performed yesterday and there is concern for mild rectal wall thickening and there is a pleural effusion and basilar atelectasis.  Plan 1.  At this point of time she is able to eat her entire tray of food and able to keep it down.  I see no urgent need for an upper endoscopy right now.  However she does mention on 4 occasions that food has got stuck in her esophagus and took a long time to go down.  2.  She will need evaluation of the abnormal appearance of her rectum on the x-ray with endoscopy evaluation with Dr. Vira [name]  as an outpatient.   3. Suggest outpatient follow up with Dr Cassidy [name] for EGD+sigmoidoscopy. H/o suggestive of a schatzkis ring   I will sign off.  Please call me if any further GI concerns or questions.  We would like to thank you for the opportunity to participate in the care of Cassidy [name].    Thank you for involving me in the care of this patient.      LOS: 0 days   Cassidy Bellows, MD  11/21/2017, 9:59 AM

## 2017-11-22 DIAGNOSIS — K219 Gastro-esophageal reflux disease without esophagitis: Secondary | ICD-10-CM

## 2017-11-22 DIAGNOSIS — K769 Liver disease, unspecified: Secondary | ICD-10-CM

## 2017-11-22 DIAGNOSIS — F329 Major depressive disorder, single episode, unspecified: Secondary | ICD-10-CM

## 2017-11-22 DIAGNOSIS — E7220 Disorder of urea cycle metabolism, unspecified: Secondary | ICD-10-CM

## 2017-11-22 DIAGNOSIS — K760 Fatty (change of) liver, not elsewhere classified: Secondary | ICD-10-CM

## 2017-11-22 DIAGNOSIS — R634 Abnormal weight loss: Secondary | ICD-10-CM

## 2017-11-22 DIAGNOSIS — E669 Obesity, unspecified: Secondary | ICD-10-CM

## 2017-11-22 DIAGNOSIS — Z79899 Other long term (current) drug therapy: Secondary | ICD-10-CM

## 2017-11-22 LAB — HEPATITIS C ANTIBODY: HCV Ab: <0.1 s/co ratio (ref 0.0–0.9)

## 2017-11-22 LAB — DIRECT PLATELET ANTIBODY
PLT ASSOC. ANTI-IA/IIA: NEGATIVE
PLT ASSOC. ANTI-IIB/IIIA: POSITIVE — AB
Plt Assoc. Anti-IB/IX: NEGATIVE

## 2017-11-22 LAB — PLATELET COUNT: Platelets: 43 10*3/uL — ABNORMAL LOW (ref 150–440)

## 2017-11-22 LAB — HEPATITIS B CORE ANTIBODY, TOTAL: Hep B Core Total Ab: NEGATIVE

## 2017-11-22 MED ORDER — SODIUM CHLORIDE 0.9 % IV SOLN
INTRAVENOUS | Status: AC
  Administered 2017-11-22 – 2017-11-23 (×2): via INTRAVENOUS

## 2017-11-22 MED ORDER — LEVOTHYROXINE SODIUM 100 MCG PO TABS
100.0000 ug | ORAL_TABLET | Freq: Every day | ORAL | Status: DC
  Administered 2017-11-23 – 2017-11-24 (×2): 100 ug via ORAL
  Filled 2017-11-22 (×2): qty 1

## 2017-11-22 NOTE — Progress Notes (Signed)
[facility] Hematology/Oncology Progress Note  Date of admission: 11/20/2017  Hospital day:  11/22/2017  Chief Complaint: Cassidy [name] is a 77 y.o. female with chronic thrombocytopenia and severe depression who was admitted through the emergency room with nausea, vomiting and dehydration.  Subjective:  Feels "good".  Ate a good breakfast.  Slept well.  Talkative today.  Social History: The patient is accompanied by her sister, daughter, and grandson today.  Allergies:  Allergies  Allergen Reactions  . Sulfa Antibiotics Shortness Of Breath    Scheduled Medications: . busPIRone  15 mg Oral QHS  . escitalopram  5 mg Oral QHS  . fluticasone  2 spray Each Nare Daily  . [START ON 11/23/2017] levothyroxine  100 mcg Oral QAC breakfast  . QUEtiapine  100 mg Oral QHS  . rifaximin  550 mg Oral BID  . traZODone  150 mg Oral QHS    Review of Systems: GENERAL:  Feels good.  No fevers, sweats or weight loss. PERFORMANCE STATUS (ECOG):  2 HEENT:  No visual changes, runny nose, sore throat, mouth sores or tenderness. Lungs: No shortness of breath or cough.  No hemoptysis. Cardiac:  No chest pain, palpitations, orthopnea, or PND. GI:  Ate well.  Denies emesis.  Denies dysphagia.  h/o esophageal stricture.  No nausea, vomiting, diarrhea, constipation, melena or hematochezia. GU:  No urgency, frequency, dysuria, or hematuria. Musculoskeletal:  No back pain.  No joint pain.  No muscle tenderness. Extremities:  No pain or swelling. Skin:  Easy bruising.  No rashes or skin changes. Neuro:  No headache, numbness or weakness, balance or coordination issues. Endocrine:  No diabetes, thyroid issues, hot flashes or night sweats. Psych:  Depression.  Sees psychiatry.  s/p ECT.  Change in mood after death of her daughter 2 1/2 years ago. Pain:  No focal pain. Review of systems:  All other systems reviewed and found to be negative.  Physical Exam: Blood pressure (!) 97/42, pulse 60,  temperature 98.4 F (36.9 C), temperature source Oral, resp. rate 18, height 5' (1.524 m), weight 140 lb (63.5 kg), SpO2 94 %.  GENERAL:  Elderly woman sitting comfortably in the exam room in no acute distress. MENTAL STATUS:  Alert and oriented to person, place and time. HEAD:  Short styled blonde hair.  Normocephalic, atraumatic, face symmetric, no Cushingoid features. EYES:  Blue eyes.  Pupils equal round and reactive to light and accomodation.  No conjunctivitis or scleral icterus. ENT:  Oropharynx clear without lesion.  Tongue normal. Mucous membranes moist.  RESPIRATORY:  Clear to auscultation without rales, wheezes or rhonchi. CARDIOVASCULAR:  Regular rate and rhythm without murmur, rub or gallop. ABDOMEN:  Soft, non-tender, with active bowel sounds, and no hepatosplenomegaly.  No masses. SKIN:  No rashes, ulcers or lesions. EXTREMITIES: No edema, no skin discoloration or tenderness.  No palpable cords. NEUROLOGICAL: Unremarkable. PSYCH:  Appropriate. Conversant.   Results for orders placed or performed during the hospital encounter of 11/20/17 (from the past 48 hour(s))  Urinalysis, Complete w Microscopic     Status: Abnormal   Collection Time: 11/20/17  1:14 PM  Result Value Ref [place]   Color, Urine AMBER (A) YELLOW    Comment: BIOCHEMICALS MAY BE AFFECTED BY COLOR   APPearance CLEAR (A) CLEAR   Specific Gravity, Urine 1.020 1.005 - 1.030   pH 6.0 5.0 - 8.0   Glucose, UA NEGATIVE NEGATIVE mg/dL   Hgb urine dipstick SMALL (A) NEGATIVE   Bilirubin Urine NEGATIVE NEGATIVE  Ketones, ur 20 (A) NEGATIVE mg/dL   Protein, ur 30 (A) NEGATIVE mg/dL   Nitrite NEGATIVE NEGATIVE   Leukocytes, UA NEGATIVE NEGATIVE   RBC / HPF 6-10 0 - 5 RBC/hpf   WBC, UA 0-5 0 - 5 WBC/hpf   Bacteria, UA NONE SEEN NONE SEEN   Squamous Epithelial / LPF 0-5 0 - 5   Mucus PRESENT     Comment: Performed at [facility], [address] [place], [place] [address]  TSH     Status: Abnormal    Collection Time: 11/20/17  5:05 PM  Result Value Ref [place]   TSH 6.287 (H) 0.350 - 4.500 uIU/mL    Comment: Performed by a 3rd Generation assay with a functional sensitivity of <=0.01 uIU/mL. Performed at [facility], [address] [place], [place] [address]   Troponin I     Status: None   Collection Time: 11/20/17  5:05 PM  Result Value Ref [place]   Troponin I <0.03 <0.03 ng/mL    Comment: Performed at [facility], [address] [place], [place] [address]  Troponin I     Status: None   Collection Time: 11/20/17 10:34 PM  Result Value Ref [place]   Troponin I <0.03 <0.03 ng/mL    Comment: Performed at [facility], [address] [place], [place] [address]  Troponin I     Status: None   Collection Time: 11/21/17  3:35 AM  Result Value Ref [place]   Troponin I <0.03 <0.03 ng/mL    Comment: Performed at [facility], [address] [place], [place] [address]  Vitamin B12     Status: None   Collection Time: 11/21/17  3:35 AM  Result Value Ref [place]   Vitamin B-12 622 180 - 914 pg/mL    Comment: (NOTE) This assay is not validated for testing neonatal or myeloproliferative syndrome specimens for Vitamin B12 levels. Performed at [facility], [place] [address] [place], [place] [address]   Folate     Status: None   Collection Time: 11/21/17  3:35 AM  Result Value Ref [place]   Folate 8.3 >5.9 ng/mL    Comment: Performed at [facility], [address] [place], [place] [address]  Hepatitis B core antibody, total     Status: None   Collection Time: 11/21/17  3:35 AM  Result Value Ref [place]   Hep B Core Total Ab Negative Negative    Comment: (NOTE) Performed At: [facility] [place], [place] [id number] Rush Farmer MD [id number]   Hepatitis C antibody     Status: None   Collection Time: 11/21/17  3:35 AM  Result Value Ref [place]   HCV Ab <0.1 0.0 - 0.9 s/co ratio    Comment: (NOTE)                                   Negative:     < 0.8                             Indeterminate: 0.8 - 0.9                                  Positive:     > 0.9 The CDC recommends that a positive HCV  antibody result be followed up with a HCV Nucleic Acid Amplification test (660630). Performed At: [facility] [place], [place] [id number] Rush Farmer MD [id number]   APTT     Status: Abnormal   Collection Time: 11/21/17  3:35 AM  Result Value Ref [place]   aPTT 47 (H) 24 - 36 seconds    Comment:        IF BASELINE aPTT IS ELEVATED, SUGGEST PATIENT RISK ASSESSMENT BE USED TO DETERMINE APPROPRIATE ANTICOAGULANT THERAPY. Performed at [facility], [address] [place], [place] [address]   Protime-INR     Status: Abnormal   Collection Time: 11/21/17  3:35 AM  Result Value Ref [place]   Prothrombin Time 18.0 (H) 11.4 - 15.2 seconds   INR 1.50     Comment: Performed at [facility], [address] [place], [place] [address]  CBC with Differential/Platelet     Status: Abnormal   Collection Time: 11/21/17  3:35 AM  Result Value Ref [place]   WBC 3.7 3.6 - 11.0 K/uL   RBC 4.37 3.80 - 5.20 MIL/uL   Hemoglobin 13.0 12.0 - 16.0 g/dL   HCT 38.7 35.0 - 47.0 %   MCV 88.5 80.0 - 100.0 fL   MCH 29.7 26.0 - 34.0 pg   MCHC 33.6 32.0 - 36.0 g/dL   RDW 17.9 (H) 11.5 - 14.5 %   Platelets 57 (L) 150 - 440 K/uL   Neutrophils Relative % 74 %   Neutro Abs 2.8 1.4 - 6.5 K/uL   Lymphocytes Relative 16 %   Lymphs Abs 0.6 (L) 1.0 - 3.6 K/uL   Monocytes Relative 7 %   Monocytes Absolute 0.3 0.2 - 0.9 K/uL   Eosinophils Relative 2 %   Eosinophils Absolute 0.1 0 - 0.7 K/uL   Basophils Relative 1 %   Basophils Absolute 0.0 0 - 0.1 K/uL    Comment: Performed at [facility], [address] [place], [place] [address]  T4, free     Status: Abnormal   Collection Time: 11/21/17  3:35 AM  Result Value Ref [place]   Free T4 0.80 (L) 0.82 - 1.77 ng/dL    Comment:  (NOTE) Biotin ingestion may interfere with free T4 tests. If the results are inconsistent with the TSH level, previous test results, or the clinical presentation, then consider biotin interference. If needed, order repeat testing after stopping biotin. Performed at [facility], [address] [place], [place] [address]   Platelet count     Status: Abnormal   Collection Time: 11/22/17  3:34 AM  Result Value Ref [place]   Platelets 43 (L) 150 - 440 K/uL    Comment: Performed at [facility], [address], [place], [place] [address]   US Abdomen Complete  Result Date: 11/21/2017 CLINICAL DATA:  Persistent vomiting. Acute on chronic thrombocytopenia. History of fatty liver with elevated total bilirubin. EXAM: ABDOMEN ULTRASOUND COMPLETE COMPARISON:  Ultrasound 07/29/2013 FINDINGS: Gallbladder: No gallstones or wall thickening visualized. No sonographic Murphy sign noted by sonographer. Common bile duct: Diameter: 5 mm Liver: No focal lesion identified. Within normal limits in parenchymal echogenicity. Portal vein is patent on color Doppler imaging with normal direction of blood flow towards the liver. The portal vein is mildly distended with a diameter of 1.3 cm. IVC: No abnormality visualized. Pancreas: Visualized portion unremarkable. Spleen: Progressive enlargement, measuring 14.5 x 7.5 x 13.1 cm (volume = 750 cm^3). No focal abnormality. Right Kidney: Length: 10.8 cm.  Mild renal cortical thinning and prominence of the renal sinus fat. No hydronephrosis. Left Kidney: Length: 11.2 cm. Mild renal cortical thinning and prominence of the renal sinus fat. No hydronephrosis. Abdominal aorta: No aneurysm visualized. Other findings: Small right pleural effusion noted. No significant ascites. IMPRESSION: 1. No evidence of hepatic or biliary disease. 2. Dilated portal vein and splenomegaly, suggesting portal hypertension. 3. Mild renal cortical thinning bilaterally. Electronically Signed    By: Richardean Sale M.D.   On: 11/21/2017 08:06   Dg Abd 2 Views  Result Date: 11/20/2017 CLINICAL DATA:  Dehydration, vomiting since yesterday EXAM: ABDOMEN - 2 VIEW COMPARISON:  06/19/2017 FINDINGS: RIGHT basilar opacity question combination of effusion and atelectasis. Nonobstructive bowel gas pattern. No evidence of bowel obstruction or dilatation. Questionable mild wall thickening of the rectum. Bones demineralized with dextroconvex scoliosis and suspect superior endplate compression deformity of L2 vertebral body. No urinary tract calcification. IMPRESSION: Nonobstructive bowel gas pattern. Questionable mild rectal wall thickening, recommend correlation with proctoscopy. RIGHT lung base opacity suspect pleural effusion and basilar atelectasis. Electronically Signed   By: Lavonia Dana M.D.   On: 11/20/2017 16:29    Assessment:  Cassidy [name] is a 77 y.o. female with chronic thrombocytopenia admitted with nausea, vomiting, and dehydration.  She has mild lymphopenia.  Diet is poor.  No medications are implicated.  She is on no herbal products.  HIV testing in 06/2017 was negative.  Abdominal ultrasound on 11/21/2017 revealed a 14.5 x 7.5 cm x 13.1 cm spleen (volume 750 cm3) with mild distention of the portal vein.  Symptomatically, she feels better today.  She has struggled with depression since the death of her daughter 2 1/2 years ago.  She denies any bleeding.  Exam is stable.  Plan:   1.  Hematology:  Etiology of thrombocytopenia unclear, but likely due to poor nutrition.  Spleen slightly enlarged due to portal hypertension, although with the size of her spleen, wouldn't expect her platelet count would be < 100,000.    Work-up thus far has ruled out pseudothrombocytopenia (platelet count in blue top tube identical to purple top).  Negative/normal studies include:  B12, folate, hepatitis B core antibody and hepatitis C.  HIV testing in 06/2017 was negative.  ANA is pending.    She has mild  hypothyroidism on supplementation.    PTT slightly elevated when patient was on heparin for DVT prophylaxis.  Will check lupus anticoagulant. Agree with discontinuation of heparin for DVT prophylaxis.  Avoid aspirin and ibuprofen.  She has mild lymphocytopenia likely due to nutritional difficiency.  HIV testing negative in 06/2017.  Zinc and copper levels pending.    2.  Nutrition:  Patient may benefit from nutrition consult.  She ate better today.    3.  Gastroenterology:  Anticipate outpatient EGD to assess dysphagia and sigmoidoscopy to assess rectal wall thickening noted on imaging.  4.  Disposition:  Anticipate follow-up in outpatient department.   Lequita Asal, MD  11/22/2017, 12:05 PM

## 2017-11-22 NOTE — Progress Notes (Signed)
[facility] [place] NAME: Cassidy [name]    MR#:  [id number]  DATE OF BIRTH:  April 21, 1941  SUBJECTIVE:  CHIEF COMPLAINT:   Chief Complaint  Patient presents with  . Dehydration  . Emesis  did ok with liquids. Hypotensive, no vomiting, platelets dropped further REVIEW OF SYSTEMS:  Review of Systems  Constitutional: Negative for diaphoresis, fever, malaise/fatigue and weight loss.  HENT: Negative for ear discharge, ear pain, hearing loss, nosebleeds, sore throat and tinnitus.   Eyes: Negative for blurred vision and pain.  Respiratory: Negative for cough, hemoptysis, shortness of breath and wheezing.   Cardiovascular: Negative for chest pain, palpitations, orthopnea and leg swelling.  Gastrointestinal: Positive for nausea and vomiting. Negative for abdominal pain, blood in stool, constipation, diarrhea and heartburn.  Genitourinary: Negative for dysuria, frequency and urgency.  Musculoskeletal: Negative for back pain and myalgias.  Skin: Negative for itching and rash.  Neurological: Negative for dizziness, tingling, tremors, focal weakness, seizures, weakness and headaches.  Psychiatric/Behavioral: Positive for depression. The patient is not nervous/anxious.    DRUG ALLERGIES:   Allergies  Allergen Reactions  . Sulfa Antibiotics Shortness Of Breath   VITALS:  Blood pressure (!) 97/42, pulse 60, temperature 98.4 F (36.9 C), temperature source Oral, resp. rate 18, height 5' (1.524 m), weight 63.5 kg (140 lb), SpO2 94 %. PHYSICAL EXAMINATION:  Physical Exam  Constitutional: She is oriented to person, place, and time.  HENT:  Head: Normocephalic and atraumatic.  Eyes: Pupils are equal, round, and reactive to light. Conjunctivae and EOM are normal.  Neck: Normal range of motion. Neck supple. No tracheal deviation present. No thyromegaly present.  Cardiovascular: Normal rate, regular rhythm and normal heart sounds.  Pulmonary/Chest:  Effort normal and breath sounds normal. No respiratory distress. She has no wheezes. She exhibits no tenderness.  Abdominal: Soft. Bowel sounds are normal. She exhibits no distension. There is no tenderness.  Musculoskeletal: Normal range of motion.  Neurological: She is alert and oriented to person, place, and time. No cranial nerve deficit.  Skin: Skin is warm and dry. No rash noted.   LABORATORY PANEL:  Female CBC Recent Labs  Lab 11/21/17 0335 11/22/17 0334  WBC 3.7  --   HGB 13.0  --   HCT 38.7  --   PLT 57* 43*   ------------------------------------------------------------------------------------------------------------------ Chemistries  Recent Labs  Lab 11/20/17 1158  NA 141  K 3.7  CL 108  CO2 26  GLUCOSE 104*  BUN 14  CREATININE 0.54  CALCIUM 8.6*  AST 32  ALT 16  ALKPHOS 79  BILITOT 2.2*   RADIOLOGY:  No results found. ASSESSMENT AND PLAN:  9  y f with h/o anxiety/depression admitted for   *Acute recurrent persistent nausea/emesis: current episode seem to have resolved, no vomiting since admission Exact etiology is unknown - prior history of esophageal stricture and GERD - IV fluids for rehydration, antiemetics PRN, tolerated clear liquids - advance to soft diet as tolerated   *Acute dehydration  - continue IV fluids for rehydration  *Acute on Chronic thrombocytopenia: platelets 43 <-57 <- 59  - Onco seen and feels it could be due to nutritional difficiency - Spleen slightly enlarged due to portal hypertension.  also checking hepatitis B and C, ANA, folate.  B12 is normal.   - HIV testing negative in 06/2017. Onco has ordered Zinc and copper levels - will stop Heparin SQ as platelets dropping  *Chronic GERD without esophagitis  PPI daily  *  Chronic hypothyroidism: TSH high, 6.2 & free T4 low - Increase Synthroid to 100 mcg  *History of esophageal stricture - GI recommends outpatient follow up with Dr Tiffany Kocher for EGD+sigmoidoscopy. H/o  suggestive of a schatzkis ring   *Chronic dementia: followed by Dr Melrose [facility] neuro) continue home psychotropic regiment Increase nursing care PRN, aspiration/fall/skin care precautions while in house       All the records are reviewed and case discussed with Care Management/Social Worker. Management plans discussed with the patient, family (sister at bedside) and they are in agreement.  CODE STATUS: Full Code  TOTAL TIME TAKING CARE OF THIS PATIENT: 35 minutes.   More than 50% of the time was spent in counseling/coordination of care: YES  POSSIBLE D/C IN 1-2 DAYS, DEPENDING ON CLINICAL CONDITION.   Max Sane M.D on 11/22/2017 at 8:24 AM  Between 7am to 6pm - Pager - [phone]  After 6pm go to [email or web address] - Technical brewer [facility]  Office  [phone]  CC: Primary care physician; Kirk Ruths, MD  Note: This dictation was prepared with Dragon dictation along with smaller phrase technology. Any transcriptional errors that result from this process are unintentional.

## 2017-11-22 NOTE — Progress Notes (Signed)
Patient tolerating soft diet, no complaints. Madlyn Frankel, RN

## 2017-11-23 LAB — BASIC METABOLIC PANEL
Anion gap: 5 (ref 5–15)
BUN: 12 mg/dL (ref 8–23)
CHLORIDE: 111 mmol/L (ref 98–111)
CO2: 27 mmol/L (ref 22–32)
Calcium: 7.7 mg/dL — ABNORMAL LOW (ref 8.9–10.3)
Creatinine, Ser: 0.75 mg/dL (ref 0.44–1.00)
GFR calc Af Amer: >60 mL/min (ref >60)
GFR calc non Af Amer: >60 mL/min (ref >60)
Glucose, Bld: 85 mg/dL (ref 70–99)
Potassium: 3.6 mmol/L (ref 3.5–5.1)
SODIUM: 143 mmol/L (ref 135–145)

## 2017-11-23 LAB — T3, FREE: T3, Free: 1.7 pg/mL — ABNORMAL LOW (ref 2.0–4.4)

## 2017-11-23 LAB — DAT, POLYSPECIFIC AHG (ARMC ONLY): Polyspecific AHG test: NEGATIVE

## 2017-11-23 LAB — CBC WITH DIFFERENTIAL/PLATELET
Basophils Absolute: 0 10*3/uL (ref 0–0.1)
Basophils Relative: 1 %
Eosinophils Absolute: 0.1 10*3/uL (ref 0–0.7)
Eosinophils Relative: 4 %
HCT: 37.3 % (ref 35.0–47.0)
Hemoglobin: 12.5 g/dL (ref 12.0–16.0)
Lymphocytes Relative: 22 %
Lymphs Abs: 0.5 10*3/uL — ABNORMAL LOW (ref 1.0–3.6)
MCH: 29.6 pg (ref 26.0–34.0)
MCHC: 33.6 g/dL (ref 32.0–36.0)
MCV: 88.1 fL (ref 80.0–100.0)
Monocytes Absolute: 0.2 10*3/uL (ref 0.2–0.9)
Monocytes Relative: 9 %
Neutro Abs: 1.4 10*3/uL (ref 1.4–6.5)
Neutrophils Relative %: 64 %
Platelets: 55 10*3/uL — ABNORMAL LOW (ref 150–440)
RBC: 4.24 MIL/uL (ref 3.80–5.20)
RDW: 17.7 % — ABNORMAL HIGH (ref 11.5–14.5)
WBC: 2.1 10*3/uL — ABNORMAL LOW (ref 3.6–11.0)

## 2017-11-23 LAB — CBC
HCT: 33.3 % — ABNORMAL LOW (ref 35.0–47.0)
HEMOGLOBIN: 11.5 g/dL — AB (ref 12.0–16.0)
MCH: 30.3 pg (ref 26.0–34.0)
MCHC: 34.5 g/dL (ref 32.0–36.0)
MCV: 87.6 fL (ref 80.0–100.0)
PLATELETS: 43 10*3/uL — AB (ref 150–440)
RBC: 3.8 MIL/uL (ref 3.80–5.20)
RDW: 17.5 % — ABNORMAL HIGH (ref 11.5–14.5)
WBC: 1.7 10*3/uL — ABNORMAL LOW (ref 3.6–11.0)

## 2017-11-23 LAB — PROTIME-INR
INR: 1.36
Prothrombin Time: 16.7 seconds — ABNORMAL HIGH (ref 11.4–15.2)

## 2017-11-23 LAB — RETICULOCYTES
RBC.: 4.24 MIL/uL (ref 3.80–5.20)
Retic Count, Absolute: 46.6 10*3/uL (ref 19.0–183.0)
Retic Ct Pct: 1.1 % (ref 0.4–3.1)

## 2017-11-23 LAB — APTT: aPTT: 39 seconds — ABNORMAL HIGH (ref 24–36)

## 2017-11-23 LAB — BILIRUBIN, TOTAL: Total Bilirubin: 1.2 mg/dL (ref 0.3–1.2)

## 2017-11-23 LAB — LACTATE DEHYDROGENASE: LDH: 170 U/L (ref 98–192)

## 2017-11-23 LAB — ANTINUCLEAR ANTIBODIES, IFA: ANA Ab, IFA: NEGATIVE

## 2017-11-23 MED ORDER — PANTOPRAZOLE SODIUM 40 MG PO TBEC
40.0000 mg | DELAYED_RELEASE_TABLET | Freq: Every day | ORAL | Status: DC
  Administered 2017-11-24: 40 mg via ORAL
  Filled 2017-11-23: qty 1

## 2017-11-23 NOTE — Progress Notes (Signed)
[facility] Hematology/Oncology Progress Note  Date of admission: 11/20/2017  Hospital day:  11/23/2017  Chief Complaint: Cassidy [name] is a 77 y.o. female with chronic thrombocytopenia and severe depression who was admitted through the emergency room with nausea, vomiting and dehydration.  Subjective:  Feels "better today".  Eating.  No nausea or vomiting.  No diarrhea.  Social History: The patient is accompanied by her younger sister today.  Allergies:  Allergies  Allergen Reactions  . Sulfa Antibiotics Shortness Of Breath    Scheduled Medications: . busPIRone  15 mg Oral QHS  . escitalopram  5 mg Oral QHS  . fluticasone  2 spray Each Nare Daily  . levothyroxine  100 mcg Oral QAC breakfast  . [START ON 11/24/2017] pantoprazole  40 mg Oral Daily  . QUEtiapine  100 mg Oral QHS  . rifaximin  550 mg Oral BID  . traZODone  150 mg Oral QHS    Review of Systems: GENERAL:  Feels "good today".  No fevers, sweats or weight loss. PERFORMANCE STATUS (ECOG):  1-2 HEENT:  No visual changes, runny nose, sore throat, mouth sores or tenderness. Lungs: No shortness of breath or cough.  No hemoptysis. Cardiac:  No chest pain, palpitations, orthopnea, or PND. GI:  Eating better.  No nausea or vomiting. h/o esophageal stricture.  No diarrhea, constipation, melena or hematochezia. GU:  No urgency, frequency, dysuria, or hematuria. Musculoskeletal:  No back pain.  No joint pain.  No muscle tenderness. Extremities:  No pain or swelling. Skin:  Slight bruising at IV sites.  No rashes or skin changes. Neuro:  No headache, numbness or weakness, balance or coordination issues. Endocrine:  No diabetes, thyroid issues, hot flashes or night sweats. 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  Sees psychiatry.  Mood improved. Pain:  No focal pain. Review of systems:  All other systems reviewed and found to be negative.   Physical Exam: Blood pressure 120/66, pulse 73, temperature 98.5 F  (36.9 C), temperature source Oral, resp. rate 20, height 5' (1.524 m), weight 140 lb (63.5 kg), SpO2 92 %.  GENERAL:  Elderly woman sitting comfortably ion the medical unit in no acute distress. MENTAL STATUS:  Alert and oriented to person, place and time. HEAD:  Short styled blonde hair.  Normocephalic, atraumatic, face symmetric, no Cushingoid features. EYES:  Blue eyes.  Pupils equal round and reactive to light and accomodation.  No conjunctivitis or scleral icterus. ENT:  Oropharynx clear without lesion.  Tongue normal. Mucous membranes moist.  RESPIRATORY:  Clear to auscultation without rales, wheezes or rhonchi. CARDIOVASCULAR:  Regular rate and rhythm without murmur, rub or gallop. ABDOMEN:  Soft, non-tender, with active bowel sounds, and no hepatosplenomegaly.  No masses. SKIN:  Few upper extremity small ecchymosis.  No petechiae.  No rashes, ulcers or lesions. EXTREMITIES: No edema, no skin discoloration or tenderness.  No palpable cords. LYMPH NODES: No palpable cervical, supraclavicular, axillary or inguinal adenopathy  NEUROLOGICAL: Unremarkable. PSYCH:  Appropriate.    Results for orders placed or performed during the hospital encounter of 11/20/17 (from the past 48 hour(s))  Platelet count     Status: Abnormal   Collection Time: 11/22/17  3:34 AM  Result Value Ref Range   Platelets 43 (L) 150 - 440 K/uL    Comment: Performed at [facility], [address], [place], [place] [address]  CBC     Status: Abnormal   Collection Time: 11/23/17  4:06 AM  Result Value  Ref Range   WBC 1.7 (L) 3.6 - 11.0 K/uL   RBC 3.80 3.80 - 5.20 MIL/uL   Hemoglobin 11.5 (L) 12.0 - 16.0 g/dL   HCT 33.3 (L) 35.0 - 47.0 %   MCV 87.6 80.0 - 100.0 fL   MCH 30.3 26.0 - 34.0 pg   MCHC 34.5 32.0 - 36.0 g/dL   RDW 17.5 (H) 11.5 - 14.5 %   Platelets 43 (L) 150 - 440 K/uL    Comment: Performed at [facility], [address] [place], [place] [address]  Basic metabolic panel      Status: Abnormal   Collection Time: 11/23/17  4:06 AM  Result Value Ref Range   Sodium 143 135 - 145 mmol/L   Potassium 3.6 3.5 - 5.1 mmol/L   Chloride 111 98 - 111 mmol/L   CO2 27 22 - 32 mmol/L   Glucose, Bld 85 70 - 99 mg/dL   BUN 12 8 - 23 mg/dL   Creatinine, Ser 0.75 0.44 - 1.00 mg/dL   Calcium 7.7 (L) 8.9 - 10.3 mg/dL   GFR calc non Af Amer >60 >60 mL/min   GFR calc Af Amer >60 >60 mL/min    Comment: (NOTE) The eGFR has been calculated using the CKD EPI equation. This calculation has not been validated in all clinical situations. eGFR's persistently <60 mL/min signify possible Chronic Kidney Disease.    Anion gap 5 5 - 15    Comment: Performed at [facility], [address] [place], [place] [address]   No results found.  Assessment:  Cassidy [name] is a 77 y.o. female with chronic thrombocytopenia since 08/2013 admitted with nausea, vomiting, and dehydration.  She has mild lymphopenia.  Diet is poor.  No medications are implicated.  She is on no herbal products.  HIV testing in 06/2017 was negative.  Abdominal ultrasound on 11/21/2017 revealed a 14.5 x 7.5 cm x 13.1 cm spleen (volume 750 cm3) with mild distention of the portal vein.  Symptomatically, she feels "good today". She has struggled with depression since the death of her daughter 2 1/2 years ago.  She denies any bleeding.  Exam is stable.  Plan:   1.  Hematology:  Etiology of thrombocytopenia unclear.  Spleen slightly enlarged due to portal hypertension, although with the size of her spleen, wouldn't expect her platelet count would be < 100,000.    Work-up thus far has ruled out pseudothrombocytopenia (platelet count in blue top tube identical to purple top).  Negative/normal studies include:  B12, folate, ANA, hepatitis B core antibody and hepatitis C.  HIV testing was negative in 06/2017.    She has mild hypothyroidism on supplementation.  HIV testing negative in 06/2017.  Zinc and copper levels are  pending.    Initial PTT slightly elevated when patient was on heparin for DVT prophylaxis.  Llupus anticoagulant testing is pending. Agree with NO heparin for DVT prophylaxis.  Avoid aspirin and ibuprofen.  Counts today show a dramatic decrease in WBC (3700 to 1700) and hemoglobin (13 to 11.5).  No bleeding.  No fever.  Patient may have had blood drawn from above IV site (? dilutional).  Recheck CBC, PT, PTT, retic, [place], LDH.  Guaiac all stools.  She has mild lymphocytopenia felt likely due to nutritional difficiency.  May need to consider peripheral blood flow cytometry or bone marrow if counts do not recover.  Peripheral smear for pathologic review.  Re-review of medications including note only: Pepcid (< 1%  leukopenia, thrombocytopenia), Seroquel (>= 1% leukopenia and < 1% thrombocytopenia), Phenergan ( ITP, leukopenia and thrombocytopenia Frequency Not Defined), and Lexapro (< 1% leukopenia and thrombocytopenia).  2.  Gastroenterology:  Anticipate outpatient EGD to assess dysphagia and sigmoidoscopy to assess rectal wall thickening noted on imaging.  No bleeding per patient.  Guaiac all stools.  3.  Disposition:  Per patient, plan to discharge to outpatient department tomorrow.   Lequita Asal, MD  11/23/2017, 6:22 PM

## 2017-11-23 NOTE — Progress Notes (Signed)
[facility] [place] NAME: Cassidy [name]    MR#:  [id number]  DATE OF BIRTH:  [birth year]  SUBJECTIVE:  Doing well today. Has not had any vomiting or nausea. Has been eating and drinking like normal. Tolerating diet. No abdominal pain.  REVIEW OF SYSTEMS:  Review of Systems  Constitutional: Negative for diaphoresis, fever, malaise/fatigue and weight loss.  HENT: Negative for ear discharge, ear pain, hearing loss, nosebleeds, sore throat and tinnitus.   Eyes: Negative for blurred vision and pain.  Respiratory: Negative for cough, hemoptysis, shortness of breath and wheezing.   Cardiovascular: Negative for chest pain, palpitations, orthopnea and leg swelling.  Gastrointestinal: Negative for abdominal pain, blood in stool, constipation, diarrhea, heartburn, nausea and vomiting.  Genitourinary: Negative for dysuria, frequency and urgency.  Musculoskeletal: Negative for back pain and myalgias.  Skin: Negative for itching and rash.  Neurological: Negative for dizziness, tingling, tremors, focal weakness, seizures, weakness and headaches.  Psychiatric/Behavioral: Negative for depression. The patient is not nervous/anxious.    DRUG ALLERGIES:   Allergies  Allergen Reactions  . Sulfa Antibiotics Shortness Of Breath   VITALS:  Blood pressure 115/60, pulse 64, temperature 98 F (36.7 C), temperature source Oral, resp. rate 18, height 5' (1.524 m), weight 63.5 kg (140 lb), SpO2 92 %. PHYSICAL EXAMINATION:  Physical Exam  Constitutional: She is oriented to person, place, and time. She appears well-developed and well-nourished.  HENT:  Head: Normocephalic and atraumatic.  Eyes: Pupils are equal, round, and reactive to light. Conjunctivae and EOM are normal.  Neck: Normal range of motion. Neck supple. No tracheal deviation present. No thyromegaly present.  Cardiovascular: Normal rate, regular rhythm and normal heart sounds.  No murmur  heard. Pulmonary/Chest: Effort normal and breath sounds normal. No respiratory distress. She has no wheezes. She exhibits no tenderness.  Abdominal: Soft. Bowel sounds are normal. She exhibits no distension. There is no tenderness.  Musculoskeletal: Normal range of motion.  Neurological: She is alert and oriented to person, place, and time. No cranial nerve deficit.  Skin: Skin is warm and dry. No rash noted.  Psychiatric: She has a normal mood and affect. Her behavior is normal.   LABORATORY PANEL:  Female CBC Recent Labs  Lab 11/23/17 0406  WBC 1.7*  HGB 11.5*  HCT 33.3*  PLT 43*   ------------------------------------------------------------------------------------------------------------------ Chemistries  Recent Labs  Lab 11/20/17 1158 11/23/17 0406  NA 141 143  K 3.7 3.6  CL 108 111  CO2 26 27  GLUCOSE 104* 85  BUN 14 12  CREATININE 0.54 0.75  CALCIUM 8.6* 7.7*  AST 32  --   ALT 16  --   ALKPHOS 79  --   BILITOT 2.2*  --    RADIOLOGY:  No results found. ASSESSMENT AND PLAN:  77  yo f with h/o anxiety/depression admitted for   *Acute recurrent persistent nausea/emesis: current episode seem to have resolved, no vomiting since admission- exact etiology is unknown, but has a history of esophageal stricture and GERD - off IVFs, encouraged po intake - now on soft diet -  zofran and phenergan prn  *Acute dehydration- improved  - IVFs stopped today - encouraged po intake  *Acute on Chronic thrombocytopenia: platelets 43 <-57 <- 59  - Onc seen and feels it could be due to nutritional difficiency - Spleen slightly enlarged due to portal hypertension.  also checking hepatitis B and C, ANA, folate.  B12 is normal.   - HIV testing  negative in 06/2017. Onc has ordered Zinc and copper levels - holding anticoagulation, avoid aspirin and NSAIDs - needs f/u with oncology as outpatient  *Chronic GERD without esophagitis  - PPI daily  *Chronic hypothyroidism: TSH high,  6.2 & free T4 low - Synthroid increased to 100 mcg this admission  *History of esophageal stricture - GI recommends outpatient follow up with Dr Tiffany Kocher for EGD and sigmoidoscopy. H/o suggestive of a schatzkis ring   *Chronic dementia: followed by Dr Melrose Nakay[facility] neuro) continue home psychotropic regiment Increase nursing care PRN, aspiration/fall/skin care precautions while in house   All the records are reviewed and case discussed with Care Management/Social Worker. Management plans discussed with the patient, family (sister at bedside) and they are in agreement.  CODE STATUS: Full Code  TOTAL TIME TAKING CARE OF THIS PATIENT: 35 minutes.   More than 50% of the time was spent in counseling/coordination of care: YES  POSSIBLE D/C possibly tomorrow, DEPENDING ON CLINICAL CONDITION.   Berna Spare [name] M.D on 11/23/2017 at 1:52 PM  Between 7am to 6pm - Pager - [phone]  After 6pm go to [email or web address] - [employer] [facility]  Office  [phone]  CC: Primary care physician; Kirk Ruths, MD  Note: This dictation was prepared with Dragon dictation along with smaller phrase technology. Any transcriptional errors that result from this process are unintentional.

## 2017-11-24 LAB — CBC
HEMATOCRIT: 33.9 % — AB (ref 35.0–47.0)
Hemoglobin: 11.7 g/dL — ABNORMAL LOW (ref 12.0–16.0)
MCH: 30.2 pg (ref 26.0–34.0)
MCHC: 34.5 g/dL (ref 32.0–36.0)
MCV: 87.7 fL (ref 80.0–100.0)
Platelets: 47 10*3/uL — ABNORMAL LOW (ref 150–440)
RBC: 3.86 MIL/uL (ref 3.80–5.20)
RDW: 17.3 % — ABNORMAL HIGH (ref 11.5–14.5)
WBC: 1.7 10*3/uL — AB (ref 3.6–11.0)

## 2017-11-24 LAB — PATHOLOGIST SMEAR REVIEW

## 2017-11-24 LAB — BASIC METABOLIC PANEL
Anion gap: 3 — ABNORMAL LOW (ref 5–15)
BUN: 10 mg/dL (ref 8–23)
CHLORIDE: 112 mmol/L — AB (ref 98–111)
CO2: 28 mmol/L (ref 22–32)
Calcium: 8.1 mg/dL — ABNORMAL LOW (ref 8.9–10.3)
Creatinine, Ser: 0.48 mg/dL (ref 0.44–1.00)
GFR calc non Af Amer: >60 mL/min (ref >60)
Glucose, Bld: 95 mg/dL (ref 70–99)
POTASSIUM: 3.7 mmol/L (ref 3.5–5.1)
SODIUM: 143 mmol/L (ref 135–145)

## 2017-11-24 LAB — HEPATITIS B SURFACE ANTIGEN: Hepatitis B Surface Ag: NEGATIVE

## 2017-11-24 LAB — IRON AND TIBC
Iron: 31 ug/dL (ref 28–170)
Saturation Ratios: 13 % (ref 10.4–31.8)
TIBC: 249 ug/dL — ABNORMAL LOW (ref 250–450)
UIBC: 218 ug/dL

## 2017-11-24 LAB — OCCULT BLOOD X 1 CARD TO LAB, STOOL: Fecal Occult Bld: NEGATIVE

## 2017-11-24 LAB — FERRITIN: Ferritin: 30 ng/mL (ref 11–307)

## 2017-11-24 MED ORDER — ONDANSETRON 4 MG PO TBDP: 4.0000 mg | ORAL_TABLET | Freq: Three times a day (TID) | ORAL | 0 refills | Status: DC | PRN

## 2017-11-24 MED ORDER — LEVOTHYROXINE SODIUM 100 MCG PO TABS: 100.0000 ug | ORAL_TABLET | Freq: Every day | ORAL | 0 refills | Status: DC

## 2017-11-24 NOTE — Discharge Instructions (Signed)
It was a pleasure taking care of you during this hospitalization.  You came into the hospital because you were having vomiting and were dehydrated. We gave you fluids and IV medications to help with this and your symptoms improved.  We had the hematologist (blood doctor) come see you while you were here because your platelets and white blood cell counts were low. You should make sure you follow-up with them in clinic when you leave the hospital.  Your thyroid level was low here. We increased your synthroid dose from 19mg daily to 1070m. I sent in a prescription for the higher dose.  Foods High in Protein/Fat/Calories: -protein smoothies/shakes -milk  -nuts and nut butters -avocado -meats -potatoes -ensure shakes -eggs   -Dr. MaBrett Albino

## 2017-11-24 NOTE — Evaluation (Signed)
Physical Therapy Evaluation Patient Details Name: Cassidy [name] MRN: [id number] DOB: 10/25/1940 Today's Date: 11/24/2017   History of Present Illness  Pt presents to hospital on 11/20/17 with complaints of being dehydrated and vomiting. Pt subsequently diagnosed with emesis, dysphasia, and acute dehydration. Pt has a past medical history that includes anxiety, depression, fatty liver, GERD, hypothyroidism, obesity, thyroid disease, and dementia    Clinical Impression  Pt is a pleasant 78 year old female who was admitted for emesis and acute dehydration. Pt performs bed mobility, transfers, and ambulation with mod I to CGA. Pt demonstrates deficits with strength, endurance, and mobility. Pt lives with her grandson Cassidy [name] who is an EMT and has a life alert. Pt has a history that includes dementia and several falls, pts family states that multiple people have recommended RW however pt refuses. Pt demonstrates equal bilateral and WFL strength of LEs. Pt amb 40' with CGA, RW, and a chair follow, attempted without RW and pt appears much more unsteady. Pt fatigues very easy. PT recommends to pt and pts family that pt uses RW at all times. Pt instructed in and tolerates there-ex well. Pts O2 saturation monitored throughout session and remained WNL. Pt could benefit from continued skilled therapy at this time to improve deficits toward PLOF. PT will continue to work with pt at least 2x/week while admitted. D/c recommendations at this time are home with home health PT and OOB supervision.     Follow Up Recommendations Home health PT;Supervision for mobility/OOB    Equipment Recommendations  None recommended by PT(pt has all needed equipment)    Recommendations for Other Services       Precautions / Restrictions Precautions Precautions: None Restrictions Weight Bearing Restrictions: No      Mobility  Bed Mobility Overal bed mobility: Modified Independent             General bed mobility  comments: Pt requires increased UE support and time to perform without physical assistance  Transfers Overall transfer level: Modified independent Equipment used: Rolling walker (2 wheeled)             General transfer comment: Pt requires increased UE support and time to perform without physical assistance  Ambulation/Gait Ambulation/Gait assistance: Min guard Gait Distance (Feet): 40 Feet Assistive device: Rolling walker (2 wheeled) Gait Pattern/deviations: Shuffle(worsening throughout due to fatigue)     General Gait Details: pt amb 40' total with CGA, RW and chair follow. Attempted without RW however pt much more steady with RW. Pt fatigues easy. Pts gait pattern worsens with fatigue.  Stairs            Wheelchair Mobility    Modified Rankin (Stroke Patients Only)       Balance Overall balance assessment: Needs assistance   Sitting balance-Leahy Scale: Good Sitting balance - Comments: able to sit EOB without UE support and no LOB     Standing balance-Leahy Scale: Fair Standing balance comment: Pt able to stand without UE support for short periods of time however appears unsteady and does much better with UE support                             Pertinent Vitals/Pain Pain Assessment: No/denies pain    Home Living Family/patient expects to be discharged to:: Private residence Living Arrangements: Other relatives(Grandson Cassidy [name]) Available Help at Discharge: Family(grandson justin, and sister intermittently) Type of Home: Apartment Home Access: Stairs to enter Entrance Stairs-Rails: Can  reach both Entrance Stairs-Number of Steps: 3 Home Layout: One level Home Equipment: Cane - single point;Walker - 2 wheels;Wheelchair - manual      Prior Function Level of Independence: Needs assistance   Gait / Transfers Assistance Needed: pt able to amb within home pts family reports that she has been told multiple times to use RW however refuses. Family  reports that pt falls "a lot"  ADL's / Homemaking Assistance Needed: assistance from grandson Education officer, environmental Dominance        Extremity/Trunk Assessment   Upper Extremity Assessment Upper Extremity Assessment: Generalized weakness(Grossly 3+/5 including shld flex, elbow flex/ext, grip)    Lower Extremity Assessment Lower Extremity Assessment: Overall WFL for tasks assessed(Grossly at least 4/5 including hip flex, knee flex/ext, DF)       Communication   Communication: No difficulties  Cognition Arousal/Alertness: Awake/alert Behavior During Therapy: WFL for tasks assessed/performed Overall Cognitive Status: History of cognitive impairments - at baseline                                 General Comments: Pt is A&O x4 and answers questions appropriately. Pts family reports that pt has baseline demetia and struggles mostly with short term memory      General Comments      Exercises Other Exercises Other Exercises: Pt instructed in and tolerates well B SLR, LAQ, seated and standing marching x 10 reps   Assessment/Plan    PT Assessment Patient needs continued PT services  PT Problem List Decreased strength;Decreased activity tolerance;Decreased range of motion;Decreased balance;Decreased mobility;Decreased coordination;Decreased knowledge of use of DME;Decreased safety awareness       PT Treatment Interventions DME instruction;Stair training;Gait training;Functional mobility training;Therapeutic activities;Therapeutic exercise;Balance training;Patient/family education    PT Goals (Current goals can be found in the Care Plan section)  Acute Rehab PT Goals Patient Stated Goal: to go home PT Goal Formulation: With patient/family Time For Goal Achievement: 12/08/17 Potential to Achieve Goals: Good    Frequency Min 2X/week   Barriers to discharge        Co-evaluation               AM-PAC PT "6 Clicks" Daily Activity  Outcome Measure  Difficulty turning over in bed (including adjusting bedclothes, sheets and blankets)?: A Little Difficulty moving from lying on back to sitting on the side of the bed? : A Little Difficulty sitting down on and standing up from a chair with arms (e.g., wheelchair, bedside commode, etc,.)?: A Little Help needed moving to and from a bed to chair (including a wheelchair)?: A Little Help needed walking in hospital room?: A Little Help needed climbing 3-5 steps with a railing? : A Little 6 Click Score: 18    End of Session Equipment Utilized During Treatment: Gait belt Activity Tolerance: Patient tolerated treatment well Patient left: in bed;with call bell/phone within reach;with bed alarm set;with family/visitor present Nurse Communication: Other (comment);Mobility status(d/c recommendation) PT Visit Diagnosis: Unsteadiness on feet (R26.81);Other abnormalities of gait and mobility (R26.89);Repeated falls (R29.6);Muscle weakness (generalized) (M62.81);History of falling (Z91.81);Difficulty in walking, not elsewhere classified (R26.2)    Time: 0940-1005 PT Time Calculation (min) (ACUTE ONLY): 25 min   Charges:              Yolonda Kida, SPT   Nishanth Mccaughan 11/24/2017, 11:26 AM

## 2017-11-24 NOTE — Discharge Summary (Signed)
[facility] NAME: Cassidy [name]    MR#:  [id number]  DATE OF BIRTH:  04-03-1941  DATE OF ADMISSION:  11/20/2017   ADMITTING PHYSICIAN: Cassidy Harms, MD  DATE OF DISCHARGE: 11/24/2017  1:41 PM  PRIMARY CARE PHYSICIAN: Cassidy Ruths, MD   ADMISSION DIAGNOSIS:  Emesis, persistent [R11.10] Non-intractable vomiting with nausea, unspecified vomiting type [R11.2] DISCHARGE DIAGNOSIS:  Active Problems:   Emesis, persistent   Dysphagia  SECONDARY DIAGNOSIS:   Past Medical History:  Diagnosis Date  . Anxiety   . Depression   . Fatty liver   . GERD (gastroesophageal reflux disease)   . Hypothyroidism   . Obesity   . Thyroid disease    HOSPITAL COURSE:   [name] is a 77 year old female with a past medical history of hypothyroidism, obesity, depression, anxiety, and fatty liver who presented to the ED with acute dehydration in the setting of nausea and vomiting. She was admitted for further evaluation.  Nausea/Vomiting/Dehydration- resolved - She was treated with IV fluids and antiemetics - Abdominal ultrasound was unremarkable - Seen by GI, who recommended outpatient EGD and sigmoidoscopy (abdominal x-ray showed mild rectal wall thickening) - nausea/vomiting resolved and she was able to tolerate a soft diet - seen by PT, who recommended home health PT  Acute on chronic thrombocytopenia - seen by oncology and thought to be secondary to nutritional deficiency - held anticoagulation, aspirin, NSAIDs while hospitalized - needs outpatient oncology follow-up  Hypothyroidism - TSH high, T4 low this admission - Synthroid increased from 70mg daily to 1071m daily - needs TSH, T4 rechecked as outpatient  DISCHARGE CONDITIONS:  Stable, improved CONSULTS OBTAINED:  Treatment Team:  Cassidy AsalMD DRUG ALLERGIES:   Allergies  Allergen Reactions  . Sulfa Antibiotics Shortness Of Breath   DISCHARGE MEDICATIONS:     Allergies as of 11/24/2017      Reactions   Sulfa Antibiotics Shortness Of Breath      Medication List    TAKE these medications   acidophilus Caps capsule Take 1 capsule by mouth 2 (two) times daily.   busPIRone 15 MG tablet Commonly known as:  BUSPAR Take 1 tablet (15 mg total) by mouth at bedtime. What changed:  when to take this   escitalopram 20 MG tablet Commonly known as:  LEXAPRO Take 10 mg by mouth at bedtime.   fluticasone 50 MCG/ACT nasal spray Commonly known as:  FLONASE Place 2 sprays into both nostrils daily.   levothyroxine 100 MCG tablet Commonly known as:  SYNTHROID, LEVOTHROID Take 1 tablet (100 mcg total) by mouth daily before breakfast. Start taking on:  11/25/2017 What changed:    medication strength  how much to take   Melatonin 1 MG Tabs Take 5 mg by mouth at bedtime as needed.   omeprazole 40 MG capsule Commonly known as:  PRILOSEC Take 1 capsule (40 mg total) by mouth daily.   ondansetron 4 MG disintegrating tablet Commonly known as:  ZOFRAN ODT Take 1 tablet (4 mg total) by mouth every 8 (eight) hours as needed for nausea or vomiting.   potassium chloride 10 MEQ tablet Commonly known as:  K-DUR Take 10 mEq by mouth daily.   QUEtiapine 100 MG tablet Commonly known as:  SEROQUEL Take 1 tablet (100 mg total) by mouth at bedtime.   traZODone 150 MG tablet Commonly known as:  DESYREL Take 200 mg by mouth at bedtime.   XIFAXAN 550  MG Tabs tablet Generic drug:  rifaximin Take 1 tablet by mouth 2 (two) times daily.        DISCHARGE INSTRUCTIONS:  1. F/u with PCP in 1-2 weeks 2. Patient was found to have low platelets and WBCs this admission. Seen by hem/onc who did a work-up. Patient advised to f/u with them in the next couple of weeks. 3. TSH was 6.287 and T4 was low at 0.80. Synthroid dose was increased from 53mg daily to 1030m daily. Please recheck TSH and T4 in 3 months. DIET:  Regular diet DISCHARGE CONDITION:   Stable ACTIVITY:  Activity as tolerated OXYGEN:  Home Oxygen: No.  Oxygen Delivery: room air DISCHARGE LOCATION:  home   If you experience worsening of your admission symptoms, develop shortness of breath, life threatening emergency, suicidal or homicidal thoughts you must seek medical attention immediately by calling 911 or calling your MD immediately  if symptoms less severe.  You Must read complete instructions/literature along with all the possible adverse reactions/side effects for all the Medicines you take and that have been prescribed to you. Take any new Medicines after you have completely understood and accpet all the possible adverse reactions/side effects.   Please note  You were cared for by a hospitalist during your hospital stay. If you have any questions about your discharge medications or the care you received while you were in the hospital after you are discharged, you can call the unit and asked to speak with the hospitalist on call if the hospitalist that took care of you is not available. Once you are discharged, your primary care physician will handle any further medical issues. Please note that NO REFILLS for any discharge medications will be authorized once you are discharged, as it is imperative that you return to your primary care physician (or establish a relationship with a primary care physician if you do not have one) for your aftercare needs so that they can reassess your need for medications and monitor your lab values.    On the day of Discharge:  VITAL SIGNS:  Blood pressure (!) 110/58, pulse 79, temperature 98.2 F (36.8 C), temperature source Oral, resp. rate 18, height 5' (1.524 m), weight 63.5 kg (140 lb), SpO2 90 %. PHYSICAL EXAMINATION:  GENERAL:  [age].o.-year-old patient lying in the bed with no acute distress.  EYES: Pupils equal, round, reactive to light and accommodation. No scleral icterus. Extraocular muscles intact.  HEENT: Head atraumatic,  normocephalic. Oropharynx and nasopharynx clear.  NECK:  Supple, no jugular venous distention. No thyroid enlargement, no tenderness.  LUNGS: Normal breath sounds bilaterally, no wheezing, rales,rhonchi or crepitation. No use of accessory muscles of respiration.  CARDIOVASCULAR: S1, S2 normal. No murmurs, rubs, or gallops.  ABDOMEN: Soft, non-tender, non-distended. Bowel sounds present. No organomegaly or mass.  EXTREMITIES: No pedal edema, cyanosis, or clubbing.  NEUROLOGIC: Cranial nerves II through XII are intact. Muscle strength 5/5 in all extremities. Sensation intact. Gait not checked.  PSYCHIATRIC: The patient is alert and oriented x 3.  SKIN: No obvious rash, lesion, or ulcer.  DATA REVIEW:   CBC Recent Labs  Lab 11/24/17 0410  WBC 1.7*  HGB 11.7*  HCT 33.9*  PLT 47*    Chemistries  Recent Labs  Lab 11/20/17 1158  11/23/17 1832 11/24/17 0410  NA 141   < >  --  143  K 3.7   < >  --  3.7  CL 108   < >  --  112*  CO2 26   < >  --  28  GLUCOSE 104*   < >  --  95  BUN 14   < >  --  10  CREATININE 0.54   < >  --  0.48  CALCIUM 8.6*   < >  --  8.1*  AST 32  --   --   --   ALT 16  --   --   --   ALKPHOS 79  --   --   --   BILITOT 2.2*  --  1.2  --    < > = values in this interval not displayed.     Microbiology Results  Results for orders placed or performed during the hospital encounter of 06/19/16  Urine culture     Status: Abnormal   Collection Time: 06/19/16 10:33 AM  Result Value Ref Range Status   Specimen Description URINE, CLEAN CATCH  Final   Special Requests Normal  Final   Culture >=100,000 COLONIES/mL KLEBSIELLA PNEUMONIAE (A)  Final   Report Status 06/22/2016 FINAL  Final   Organism ID, Bacteria KLEBSIELLA PNEUMONIAE (A)  Final      Susceptibility   Klebsiella pneumoniae - MIC*    AMPICILLIN RESISTANT Resistant     CEFAZOLIN <=4 SENSITIVE Sensitive     CEFTRIAXONE <=1 SENSITIVE Sensitive     CIPROFLOXACIN <=0.25 SENSITIVE Sensitive      GENTAMICIN <=1 SENSITIVE Sensitive     IMIPENEM <=0.25 SENSITIVE Sensitive     NITROFURANTOIN 32 SENSITIVE Sensitive     TRIMETH/SULFA <=20 SENSITIVE Sensitive     AMPICILLIN/SULBACTAM 4 SENSITIVE Sensitive     PIP/TAZO <=4 SENSITIVE Sensitive     Extended ESBL NEGATIVE Sensitive     * >=100,000 COLONIES/mL KLEBSIELLA PNEUMONIAE    RADIOLOGY:  No results found.   Management plans discussed with the patient, family and they are in agreement.  CODE STATUS: Full Code   TOTAL TIME TAKING CARE OF THIS PATIENT: 35 minutes.    Berna Spare Elbridge Magowan M.D on 11/24/2017 at 3:26 PM  Between 7am to 6pm - Pager [phone]  After 6pm go to [email or web address] - [employer] [facility]  Office  [phone]  CC: Primary care physician; Cassidy Ruths, MD   Note: This dictation was prepared with Dragon dictation along with smaller phrase technology. Any transcriptional errors that result from this process are unintentional.

## 2017-11-24 NOTE — Plan of Care (Signed)
  Problem: Education: Goal: Knowledge of General Education information will improve Description: Including pain rating scale, medication(s)/side effects and non-pharmacologic comfort measures Outcome: Progressing   Problem: Pain Managment: Goal: General experience of comfort will improve Outcome: Progressing   Problem: Safety: Goal: Ability to remain free from injury will improve Outcome: Progressing   

## 2017-11-24 NOTE — Plan of Care (Signed)
Pt is d/ced home.  She has had no nausea since admission. She has tolerated advancement of diet.  No c/o pain.  She was evaluated by PT and will be getting Home Health PT.  IV removed by Nurse Tech.  Pt will go home with sister.  She and grandson live together.

## 2017-11-24 NOTE — Care Management Note (Signed)
Case Management Note  Patient Details  Name: Cassidy [name] MRN: [id number] Date of Birth: [birth year]  Subjective/Objective: Patient to be discharged per MD order. Orders in place for home health services. RNCM spoke with patient and daughter regarding agency. Per preference will place referral with [facility]. Spoke with [place] regarding referral for PT and aide services. Family to provide transport home. No DME needs.   Ines Bloomer RN BSN RNCM [phone]                   Action/Plan:   Expected Discharge Date:  11/24/17               Expected Discharge Plan:  [place]  In-House Referral:     Discharge planning Services  CM Consult  Post Acute Care Choice:  Home Health Choice offered to:  Patient  DME Arranged:    DME Agency:     HH Arranged:  PT, Nurse's Aide [place] Agency:  [facility]  Status of Service:  Completed, signed off  If discussed at [place] of Stay Meetings, dates discussed:    Additional Comments:  Latanya Maudlin, RN 11/24/2017, 12:03 PM

## 2017-11-24 NOTE — Care Management Important Message (Signed)
Important Message  Patient Details  Name: Cassidy [name] MRN: [id number] Date of Birth: 07-09-1940   Medicare Important Message Given:  Yes    [name] 11/24/2017, 10:32 AM

## 2017-11-25 LAB — ZINC: Zinc: 75 ug/dL (ref 56–134)

## 2017-11-25 LAB — PTT-LA MIX: PTT-LA MIX: 45.2 s (ref 0.0–48.9)

## 2017-11-25 LAB — LUPUS ANTICOAGULANT PANEL
DRVVT: 38.7 s (ref 0.0–47.0)
PTT Lupus Anticoagulant: 52.6 s — ABNORMAL HIGH (ref 0.0–51.9)

## 2017-11-25 LAB — COPPER, SERUM: Copper: 59 ug/dL — ABNORMAL LOW (ref 72–166)

## 2017-11-25 LAB — PTT-LA INCUB MIX: PTT-LA INCUB MIX: 47.3 s (ref 0.0–48.9)

## 2017-11-26 ENCOUNTER — Telehealth: Payer: Self-pay | Admitting: *Deleted

## 2017-11-26 NOTE — Telephone Encounter (Signed)
-----   Message from Karen Kitchens, NP sent at 11/25/2017  6:08 PM EDT ----- Cu level low.  See if she takes a TMV. If so, does it have Cu? If note, please have her get one and start.If level does not improve, we will need to put on copper tablets, however I want to reserve that as a last resort.   Gaspar Bidding

## 2017-11-26 NOTE — Telephone Encounter (Signed)
Attempted to call patient.  No answer.  Will try again later.

## 2017-11-27 ENCOUNTER — Telehealth: Payer: Self-pay

## 2017-11-27 ENCOUNTER — Telehealth: Payer: Self-pay | Admitting: *Deleted

## 2017-11-27 NOTE — Telephone Encounter (Signed)
-----   Message from Karen Kitchens, NP sent at 11/25/2017  6:08 PM EDT ----- Cu level low.  See if she takes a TMV. If so, does it have Cu? If note, please have her get one and start.If level does not improve, we will need to put on copper tablets, however I want to reserve that as a last resort.   Gaspar Bidding

## 2017-11-27 NOTE — Telephone Encounter (Signed)
Flagged on EMMI report for being unsure if she received new prescriptions and having other questions.   Called and spoke with patient.  She said she wasn't sure if she had received new prescriptions because her sister handles everything for her.  Per AVS, given prescriptions for Zofran and levothyroxine.  She did not have any questions at this time, however did want to express appreciation for the 1C nursing staff, stating she loved every one of them.  Specifically mentioned Wandra Feinstein, and [place].  I thanked her for her feedback and mentioned I would pass along her recognition.  Informed her she would receive one more automated call checking on her in the next few days.

## 2017-11-27 NOTE — Telephone Encounter (Signed)
Called patient to inform her that her Copper level is low.  Patient is not currently taking a multi vitamin.  Advised her to start a MV with copper.  Will recheck at next visit.

## 2017-11-30 ENCOUNTER — Telehealth: Payer: Self-pay

## 2017-11-30 NOTE — Telephone Encounter (Signed)
Flagged on EMMI report for having other questions or problems.  First attempt to reach patient made 11/30/17 at 3:42pm, however unable to reach.  Left message encouraging callback.  Will attempt at later time.

## 2017-12-01 ENCOUNTER — Inpatient Hospital Stay: Payer: Medicare Other | Admitting: Hematology and Oncology

## 2017-12-01 DIAGNOSIS — D72819 Decreased white blood cell count, unspecified: Secondary | ICD-10-CM | POA: Insufficient documentation

## 2017-12-01 NOTE — Telephone Encounter (Signed)
Second attempt to reach patient made 12/01/17 at 11:30am, however unable to reach.  Left another message encouraging callback should she have any questions or concerns regarding her recent discharge.

## 2017-12-01 NOTE — Progress Notes (Deleted)
[facility] day:  12/01/2017  Chief Complaint: Cassidy [name] is a 77 y.o. female with chronic thrombocytopenia who is seen for follow-up after recent admission.  HPI: The patient [facility] - 11/24/2017 with nausea, vomiting, and dehydration.  Patient had no problems per her sister until approximately 2-1/2 years ago when she stopped eating after her daughter passed away.  She has gone "downhill" since that time.  She does not want to eat or drink.  She is lost a significant amount of weight.  She has been followed by psychiatry.  At some point in the past she underwent ECT therapy when hospitalized for depression.  She made some improvement.  She has been on psychiatric medications.  Sister notes that at one point she was on "high-dose" medications.  Subsequently, they were sdecreased.  Currently she is on Buspar, Lexapro, and Trazadone.  She does not take any herbal products except for melatonin.  She has a long-standing history of thrombocytopenia.  Etiology is unclear.  Her sister notes no apparent work-up.  Labs dating back to 07/25/2013 revealed a platelet count ranging between 57,000 and 103,000.  Hematocrit has been normal.  She has had a periodic low white count.  Lymphocyte count has been low.  ALC has ranged between 400 - 600 in the past year.  She has had no issues with infections.  She bruises easily.  She apparently has some underlying liver disease.  She has no history of hepatitis.  She has been on Xifaxan for an elevated ammonia.  Labs on admission have included a hematocrit of 38.7, hemoglobin 13, MCV 88.5, platelets 57,000, white count 3700 with an ANC of 2800.  Absolute lymphocyte count is 600.  B12 is 622.  Folate is 8.3.  TSH is 6.287 (0.35 - 4.5).  Free T4 0.80 (0.82 - 1.77) .  PTT was 47 (24 - 36).   CBC on 11/24/2017 revealed a hematocrit of 33.9, hemoglobin 11.7, MCV 87.7, platelets 47,000, WBC 1700.   WBC was 2100 with an [place] of 1400 on 11/23/2017.  Ferritin was 30 with an iron saturation of 13% and a TIBC of 249 on 11/24/2017.   Past Medical History:  Diagnosis Date  . Anxiety   . Depression   . Fatty liver   . GERD (gastroesophageal reflux disease)   . Hypothyroidism   . Obesity   . Thyroid disease     Past Surgical History:  Procedure Laterality Date  . APPENDECTOMY    . ESOPHAGOGASTRODUODENOSCOPY (EGD) WITH PROPOFOL N/A 12/20/2014   Procedure: ESOPHAGOGASTRODUODENOSCOPY (EGD) WITH PROPOFOL;  Surgeon: Manya Silvas, MD;  Location: [facility] ENDOSCOPY;  Service: Endoscopy;  Laterality: N/A;  . SAVORY DILATION N/A 12/20/2014   Procedure: SAVORY DILATION;  Surgeon: Manya Silvas, MD;  Location: [facility] ENDOSCOPY;  Service: Endoscopy;  Laterality: N/A;  . TONSILLECTOMY      Family History  Problem Relation Age of Onset  . Diabetes Mother   . COPD Mother   . Kidney disease Mother   . Depression Mother   . Heart attack Father   . Aneurysm Father   . Anxiety disorder Sister   . Depression Sister   . Diabetes Sister   . Hypertension Sister   . Atrial fibrillation Brother   . Spinal muscular atrophy Brother   . Breast cancer Sister   . Bone cancer Sister   . Depression Sister   . Anxiety disorder Sister   .  Breast cancer Sister   . Colon cancer Sister   . Depression Sister   . Diabetes Sister   . COPD Sister   . Depression Sister   . Anxiety disorder Sister   . Diabetes Brother   . Depression Brother     Social History:  reports that she has never smoked. She has never used smokeless tobacco. She reports that she does not drink alcohol or use drugs.  The patient is accompanied by *** alone today.  Allergies:  Allergies  Allergen Reactions  . Sulfa Antibiotics Shortness Of Breath    Current Medications: Current Outpatient Medications  Medication Sig Dispense Refill  . acidophilus (RISAQUAD) CAPS capsule Take 1 capsule by mouth 2 (two) times daily.    .  busPIRone (BUSPAR) 15 MG tablet Take 1 tablet (15 mg total) by mouth at bedtime. (Patient taking differently: Take 15 mg by mouth 2 (two) times daily. ) 30 tablet 4  . escitalopram (LEXAPRO) 20 MG tablet Take 10 mg by mouth at bedtime.    . fluticasone (FLONASE) 50 MCG/ACT nasal spray Place 2 sprays into both nostrils daily. 16 g 2  . levothyroxine (SYNTHROID, LEVOTHROID) 100 MCG tablet Take 1 tablet (100 mcg total) by mouth daily before breakfast. 30 tablet 0  . Melatonin 1 MG TABS Take 5 mg by mouth at bedtime as needed.    Marland Kitchen omeprazole (PRILOSEC) 40 MG capsule Take 1 capsule (40 mg total) by mouth daily. 30 capsule 1  . ondansetron (ZOFRAN ODT) 4 MG disintegrating tablet Take 1 tablet (4 mg total) by mouth every 8 (eight) hours as needed for nausea or vomiting. 25 tablet 0  . potassium chloride (K-DUR) 10 MEQ tablet Take 10 mEq by mouth daily.    . QUEtiapine (SEROQUEL) 100 MG tablet Take 1 tablet (100 mg total) by mouth at bedtime. 30 tablet 4  . traZODone (DESYREL) 150 MG tablet Take 200 mg by mouth at bedtime.    Marland Kitchen XIFAXAN 550 MG TABS tablet Take 1 tablet by mouth 2 (two) times daily.     No current facility-administered medications for this visit.     Review of Systems:  GENERAL:  Feels good.  Active.  No fevers, sweats or weight loss. PERFORMANCE STATUS (ECOG):  2 HEENT:  No visual changes, runny nose, sore throat, mouth sores or tenderness. Lungs: No shortness of breath or cough.  No hemoptysis. Cardiac:  No chest pain, palpitations, orthopnea, or PND. GI:  1-2 days of emesis.  Poor appetite x 2 1/2 years.  h/o esophageal stricture.  No diarrhea, constipation, melena or hematochezia. GU:  No urgency, frequency, dysuria, or hematuria. Musculoskeletal:  No back pain.  No joint pain.  No muscle tenderness. Extremities:  No pain or swelling. Skin:  Easy bruising.  No rashes or skin changes. Neuro:  No headache, numbness or weakness, balance or coordination issues. Endocrine:  No  diabetes.  Thyroid disease on Synthroid.  No hot flashes or night sweats. Psych:  Depression.  Sees psychiatry. s/p ECT.  Change in mood after death of her daughter 2 1/2 years ago. Pain:  No focal pain. Review of systems:  All other systems reviewed and found to be negative.  GENERAL:  Feels good.  Active.  No fevers, sweats or weight loss. PERFORMANCE STATUS (ECOG):  *** HEENT:  No visual changes, runny nose, sore throat, mouth sores or tenderness. Lungs: No shortness of breath or cough.  No hemoptysis. Cardiac:  No chest pain, palpitations, orthopnea, or PND.  GI:  No nausea, vomiting, diarrhea, constipation, melena or hematochezia. GU:  No urgency, frequency, dysuria, or hematuria. Musculoskeletal:  No back pain.  No joint pain.  No muscle tenderness. Extremities:  No pain or swelling. Skin:  No rashes or skin changes. Neuro:  No headache, numbness or weakness, balance or coordination issues. Endocrine:  No diabetes, thyroid issues, hot flashes or night sweats. Psych:  No mood changes, depression or anxiety. Pain:  No focal pain. Review of systems:  All other systems reviewed and found to be negative.  Physical Exam: There were no vitals taken for this visit. GENERAL:  Chronically fatigued appearing woman lying comfortably on the medical unit in no acute distress.  She defers most of the questions to her sister. MENTAL STATUS:  Alert and interactive. HEAD:  Short styled blonde hair.  Normocephalic, atraumatic, face symmetric, no Cushingoid features. EYES:  Blue eyes.  Pupils equal round and reactive to light and accomodation.  No conjunctivitis or scleral icterus. ENT:  Oropharynx clear without lesion.  Tongue normal. Mucous membranes moist.  RESPIRATORY:  Clear to auscultation without rales, wheezes or rhonchi. CARDIOVASCULAR:  Regular rate and rhythm without murmur, rub or gallop. ABDOMEN:  Soft, non-tender, with active bowel sounds, and no hepatosplenomegaly.  No masses. SKIN:   No rashes, ulcers or lesions. EXTREMITIES: No edema, no skin discoloration or tenderness.  No palpable cords. LYMPH NODES: No palpable cervical, supraclavicular, axillary or inguinal adenopathy  NEUROLOGICAL: Unremarkable. PSYCH:  Somewhat withdrawn.    GENERAL:  Well developed, well nourished, **man sitting comfortably in the exam room in no acute distress. MENTAL STATUS:  Alert and oriented to person, place and time. HEAD:  *** hair.  Normocephalic, atraumatic, face symmetric, no Cushingoid features. EYES:  *** eyes.  Pupils equal round and reactive to light and accomodation.  No conjunctivitis or scleral icterus. ENT:  Oropharynx clear without lesion.  Tongue normal. Mucous membranes moist.  RESPIRATORY:  Clear to auscultation without rales, wheezes or rhonchi. CARDIOVASCULAR:  Regular rate and rhythm without murmur, rub or gallop. ABDOMEN:  Soft, non-tender, with active bowel sounds, and no hepatosplenomegaly.  No masses. SKIN:  No rashes, ulcers or lesions. EXTREMITIES: No edema, no skin discoloration or tenderness.  No palpable cords. LYMPH NODES: No palpable cervical, supraclavicular, axillary or inguinal adenopathy  NEUROLOGICAL: Unremarkable. PSYCH:  Appropriate.   No visits with results within 3 Day(s) from this visit.  Latest known visit with results is:  Admission on 11/20/2017, Discharged on 11/24/2017  Component Date Value Ref Range Status  . Sodium 11/20/2017 141  135 - 145 mmol/L Final  . Potassium 11/20/2017 3.7  3.5 - 5.1 mmol/L Final  . Chloride 11/20/2017 108  98 - 111 mmol/L Final  . CO2 11/20/2017 26  22 - 32 mmol/L Final  . Glucose, Bld 11/20/2017 104* 70 - 99 mg/dL Final  . BUN 11/20/2017 14  8 - 23 mg/dL Final  . Creatinine, Ser 11/20/2017 0.54  0.44 - 1.00 mg/dL Final  . Calcium 11/20/2017 8.6* 8.9 - 10.3 mg/dL Final  . Total Protein 11/20/2017 7.0  6.5 - 8.1 g/dL Final  . Albumin 11/20/2017 3.5  3.5 - 5.0 g/dL Final  . AST 11/20/2017 32  15 - 41 U/L Final   . ALT 11/20/2017 16  0 - 44 U/L Final  . Alkaline Phosphatase 11/20/2017 79  38 - 126 U/L Final  . Total Bilirubin 11/20/2017 2.2* 0.3 - 1.2 mg/dL Final  . GFR calc non Af Amer 11/20/2017 >60  >  60 mL/min Final  . GFR calc Af Amer 11/20/2017 >60  >60 mL/min Final   Comment: (NOTE) The eGFR has been calculated using the CKD EPI equation. This calculation has not been validated in all clinical situations. eGFR's persistently <60 mL/min signify possible Chronic Kidney Disease.   Georgiann Hahn gap 11/20/2017 7  5 - 15 Final   Performed at [facility], [address] [place], [place] [address]  . WBC 11/20/2017 3.6  3.6 - 11.0 K/uL Final  . RBC 11/20/2017 4.61  3.80 - 5.20 MIL/uL Final  . Hemoglobin 11/20/2017 13.7  12.0 - 16.0 g/dL Final  . HCT 11/20/2017 40.5  35.0 - 47.0 % Final  . MCV 11/20/2017 87.8  80.0 - 100.0 fL Final  . MCH 11/20/2017 29.7  26.0 - 34.0 pg Final  . MCHC 11/20/2017 33.8  32.0 - 36.0 g/dL Final  . RDW 11/20/2017 17.8* 11.5 - 14.5 % Final  . Platelets 11/20/2017 59* 150 - 440 K/uL Final   Performed at [facility], [address], [place], [place] [address]  . Color, Urine 11/20/2017 AMBER* YELLOW Final   BIOCHEMICALS MAY BE AFFECTED BY COLOR  . APPearance 11/20/2017 CLEAR* CLEAR Final  . Specific Gravity, Urine 11/20/2017 1.020  1.005 - 1.030 Final  . pH 11/20/2017 6.0  5.0 - 8.0 Final  . Glucose, UA 11/20/2017 NEGATIVE  NEGATIVE mg/dL Final  . Hgb urine dipstick 11/20/2017 SMALL* NEGATIVE Final  . Bilirubin Urine 11/20/2017 NEGATIVE  NEGATIVE Final  . Ketones, ur 11/20/2017 20* NEGATIVE mg/dL Final  . Protein, ur 11/20/2017 30* NEGATIVE mg/dL Final  . Nitrite 11/20/2017 NEGATIVE  NEGATIVE Final  . Leukocytes, UA 11/20/2017 NEGATIVE  NEGATIVE Final  . RBC / HPF 11/20/2017 6-10  0 - 5 RBC/hpf Final  . WBC, UA 11/20/2017 0-5  0 - 5 WBC/hpf Final  . Bacteria, UA 11/20/2017 NONE SEEN  NONE SEEN Final  . Squamous Epithelial / LPF 11/20/2017 0-5  0  - 5 Final  . Mucus 11/20/2017 PRESENT   Final   Performed at [facility], [address] [place], [place] [address]  . Troponin I 11/20/2017 <0.03  <0.03 ng/mL Final   Performed at [facility], [address] [place], [place] [address]  . Lipase 11/20/2017 32  11 - 51 U/L Final   Performed at [facility], [address] [place], [place] [address]  . Plt Assoc. Anti-IIB/IIIA 11/20/2017 Positive* Negative Final  . Plt Assoc. Anti-IB/IX 11/20/2017 Negative  Negative Final  . Plt Assoc. Anti-IA/IIA 11/20/2017 Negative  Negative Final   Comment: (NOTE) Performed At: [facility] [place], [place] [id number] Rush Farmer MD QQ:[id number]   . TSH 11/20/2017 6.287* 0.350 - 4.500 uIU/mL Final   Comment: Performed by a 3rd Generation assay with a functional sensitivity of <=0.01 uIU/mL. Performed at [facility], [address], [place], [place] [address]   . Troponin I 11/20/2017 <0.03  <0.03 ng/mL Final   Performed at [facility], [address] [place], [place] [address]  . Troponin I 11/20/2017 <0.03  <0.03 ng/mL Final   Performed at [facility], [address] [place], [place] [address]  . Troponin I 11/21/2017 <0.03  <0.03 ng/mL Final   Performed at [facility], [address] [place], [place] [address]  . Vitamin B-12 11/21/2017 622  180 - 914 pg/mL Final   Comment: (NOTE) This assay is not validated for testing neonatal or myeloproliferative  syndrome specimens for Vitamin B12 levels. Performed at [facility], [address], [place], [place] [address]   . Folate 11/21/2017 8.3  >5.9 ng/mL Final   Performed at [facility], [address] [place], [place] [address]  . ANA Ab, IFA 11/21/2017 Negative   Final   Comment: (NOTE)                                     Negative   <1:80                                     Borderline  1:80                                      Positive   >1:80 Performed At: [facility] [place], [place] [id number] Rush Farmer MD [id number]   . Hep B Core Total Ab 11/21/2017 Negative  Negative Final   Comment: (NOTE) Performed At: [facility] [place], [place] [id number] Rush Farmer MD YT:[id number]   . Hepatitis B Surface Ag 11/21/2017 Negative  Negative Final   Comment: (NOTE) Performed At: [facility] [place], [place] [id number] Rush Farmer MD [id number]   . HCV Ab 11/21/2017 <0.1  0.0 - 0.9 s/co ratio Final   Comment: (NOTE)                                  Negative:     < 0.8                             Indeterminate: 0.8 - 0.9                                  Positive:     > 0.9 The CDC recommends that a positive HCV antibody result be followed up with a HCV Nucleic Acid Amplification test (151761). Performed At: [facility] [place], [place] [id number] Rush Farmer MD IR:[id number]   . aPTT 11/21/2017 47* 24 - 36 seconds Final   Comment:        IF BASELINE aPTT IS ELEVATED, SUGGEST PATIENT RISK ASSESSMENT BE USED TO DETERMINE APPROPRIATE ANTICOAGULANT THERAPY. Performed at [facility], [address] [place], [place] [address]   . Prothrombin Time 11/21/2017 18.0* 11.4 - 15.2 seconds Final  . INR 11/21/2017 1.50   Final   Performed at [facility], [address] [place], [place] [address]  . WBC 11/21/2017 3.7  3.6 - 11.0 K/uL Final  . RBC 11/21/2017 4.37  3.80 - 5.20 MIL/uL Final  . Hemoglobin 11/21/2017 13.0  12.0 - 16.0 g/dL Final  . HCT 11/21/2017 38.7  35.0 - 47.0 % Final  . MCV 11/21/2017 88.5  80.0 - 100.0 fL Final  . MCH 11/21/2017 29.7  26.0 - 34.0 pg Final  . MCHC 11/21/2017 33.6  32.0 - 36.0 g/dL Final  . RDW 11/21/2017 17.9* 11.5 - 14.5 % Final  . Platelets 11/21/2017 57* 150 - 440 K/uL Final  .  Neutrophils Relative % 11/21/2017 74  % Final  . Neutro  Abs 11/21/2017 2.8  1.4 - 6.5 K/uL Final  . Lymphocytes Relative 11/21/2017 16  % Final  . Lymphs Abs 11/21/2017 0.6* 1.0 - 3.6 K/uL Final  . Monocytes Relative 11/21/2017 7  % Final  . Monocytes Absolute 11/21/2017 0.3  0.2 - 0.9 K/uL Final  . Eosinophils Relative 11/21/2017 2  % Final  . Eosinophils Absolute 11/21/2017 0.1  0 - 0.7 K/uL Final  . Basophils Relative 11/21/2017 1  % Final  . Basophils Absolute 11/21/2017 0.0  0 - 0.1 K/uL Final   Performed at [facility], [address], [place], [place] [address]  . Free T4 11/21/2017 0.80* 0.82 - 1.77 ng/dL Final   Comment: (NOTE) Biotin ingestion may interfere with free T4 tests. If the results are inconsistent with the TSH level, previous test results, or the clinical presentation, then consider biotin interference. If needed, order repeat testing after stopping biotin. Performed at [facility], [address], [place], [place] [address]   . T3, Free 11/21/2017 1.7* 2.0 - 4.4 pg/mL Final   Comment: (NOTE) Performed At: [facility] [place], [place] [id number] Rush Farmer MD YS:[id number]   . Zinc 11/22/2017 75  56 - 134 ug/dL Final   Comment: (NOTE) This test was developed and its performance characteristics determined by [facility]. It has not been cleared or approved by the Food and Drug Administration.                                Detection Limit = 5 Performed At: [facility] [place], [place] [id number] Rush Farmer MD GU:[id number]   . Copper 11/22/2017 59* 72 - 166 ug/dL Final   Comment: (NOTE) This test was developed and its performance characteristics determined by [facility]. It has not been cleared or approved by the Food and Drug Administration.                                Detection Limit = 5 Performed At: [facility] [place], [place] [id number] Rush Farmer MD YW:[id number]   . Platelets 11/22/2017 43* 150 -  440 K/uL Final   Performed at [facility], [address] [place], [place] [address]  . WBC 11/23/2017 1.7* 3.6 - 11.0 K/uL Final  . RBC 11/23/2017 3.80  3.80 - 5.20 MIL/uL Final  . Hemoglobin 11/23/2017 11.5* 12.0 - 16.0 g/dL Final  . HCT 11/23/2017 33.3* 35.0 - 47.0 % Final  . MCV 11/23/2017 87.6  80.0 - 100.0 fL Final  . MCH 11/23/2017 30.3  26.0 - 34.0 pg Final  . MCHC 11/23/2017 34.5  32.0 - 36.0 g/dL Final  . RDW 11/23/2017 17.5* 11.5 - 14.5 % Final  . Platelets 11/23/2017 43* 150 - 440 K/uL Final   Performed at [facility], [address], [place], [place] [address]  . Sodium 11/23/2017 143  135 - 145 mmol/L Final  . Potassium 11/23/2017 3.6  3.5 - 5.1 mmol/L Final  . Chloride 11/23/2017 111  98 - 111 mmol/L Final  . CO2 11/23/2017 27  22 - 32 mmol/L Final  . Glucose, Bld 11/23/2017 85  70 - 99 mg/dL Final  . BUN 11/23/2017 12  8 - 23 mg/dL Final  . Creatinine, Ser 11/23/2017 0.75  0.44 -  1.00 mg/dL Final  . Calcium 11/23/2017 7.7* 8.9 - 10.3 mg/dL Final  . GFR calc non Af Amer 11/23/2017 >60  >60 mL/min Final  . GFR calc Af Amer 11/23/2017 >60  >60 mL/min Final   Comment: (NOTE) The eGFR has been calculated using the CKD EPI equation. This calculation has not been validated in all clinical situations. eGFR's persistently <60 mL/min signify possible Chronic Kidney Disease.   Georgiann Hahn gap 11/23/2017 5  5 - 15 Final   Performed at [facility], [address] [place], [place] [address]  . PTT Lupus Anticoagulant 11/23/2017 52.6* 0.0 - 51.9 sec Final  . DRVVT 11/23/2017 38.7  0.0 - 47.0 sec Final  . Lupus Anticoag Interp 11/23/2017 Comment:   Corrected   Comment: (NOTE) No lupus anticoagulant was detected. These results are consistent with a deficiency of one or more intrinsic pathway factors.  Since the dRVVT was within normal limits, the factors in question are VIII, IX, XI, XII and the contact factors. Performed At: [facility] [place], [place] [id number] Rush Farmer MD IR:[id number]   . Sodium 11/24/2017 143  135 - 145 mmol/L Final  . Potassium 11/24/2017 3.7  3.5 - 5.1 mmol/L Final  . Chloride 11/24/2017 112* 98 - 111 mmol/L Final  . CO2 11/24/2017 28  22 - 32 mmol/L Final  . Glucose, Bld 11/24/2017 95  70 - 99 mg/dL Final  . BUN 11/24/2017 10  8 - 23 mg/dL Final  . Creatinine, Ser 11/24/2017 0.48  0.44 - 1.00 mg/dL Final  . Calcium 11/24/2017 8.1* 8.9 - 10.3 mg/dL Final  . GFR calc non Af Amer 11/24/2017 >60  >60 mL/min Final  . GFR calc Af Amer 11/24/2017 >60  >60 mL/min Final   Comment: (NOTE) The eGFR has been calculated using the CKD EPI equation. This calculation has not been validated in all clinical situations. eGFR's persistently <60 mL/min signify possible Chronic Kidney Disease.   Georgiann Hahn gap 11/24/2017 3* 5 - 15 Final   Performed at [facility], [address] [place], [place] [address]  . WBC 11/24/2017 1.7* 3.6 - 11.0 K/uL Final  . RBC 11/24/2017 3.86  3.80 - 5.20 MIL/uL Final  . Hemoglobin 11/24/2017 11.7* 12.0 - 16.0 g/dL Final  . HCT 11/24/2017 33.9* 35.0 - 47.0 % Final  . MCV 11/24/2017 87.7  80.0 - 100.0 fL Final  . MCH 11/24/2017 30.2  26.0 - 34.0 pg Final  . MCHC 11/24/2017 34.5  32.0 - 36.0 g/dL Final  . RDW 11/24/2017 17.3* 11.5 - 14.5 % Final  . Platelets 11/24/2017 47* 150 - 440 K/uL Final   Performed at [facility], [address], [place], [place] [address]  . Path Review 11/23/2017 Peripheral blood smear is reviewed.   Final   Comment: Patient with chronic thrombocytopenia. Workup has been negative. Normocytic anemia with unremarkable RBC morphology. Leukopenia with absolute lymphopenia and neutropenia. Unremarkable WBC morphology. Thrombocytopenia with unremarkable platelet morphology. Dr. Mike Gip has additional laboratory studies pending.  The differential includes medication or drug effect. Reviewed by Dellia Nims  Reuel Derby, M.D. Performed at [facility], [address], [place], [place] [address]   . WBC 11/23/2017 2.1* 3.6 - 11.0 K/uL Final  . RBC 11/23/2017 4.24  3.80 - 5.20 MIL/uL Final  . Hemoglobin 11/23/2017 12.5  12.0 - 16.0 g/dL Final  . HCT 11/23/2017 37.3  35.0 - 47.0 % Final  . MCV 11/23/2017 88.1  80.0 -  100.0 fL Final  . MCH 11/23/2017 29.6  26.0 - 34.0 pg Final  . MCHC 11/23/2017 33.6  32.0 - 36.0 g/dL Final  . RDW 11/23/2017 17.7* 11.5 - 14.5 % Final  . Platelets 11/23/2017 55* 150 - 440 K/uL Final  . Neutrophils Relative % 11/23/2017 64  % Final  . Neutro Abs 11/23/2017 1.4  1.4 - 6.5 K/uL Final  . Lymphocytes Relative 11/23/2017 22  % Final  . Lymphs Abs 11/23/2017 0.5* 1.0 - 3.6 K/uL Final  . Monocytes Relative 11/23/2017 9  % Final  . Monocytes Absolute 11/23/2017 0.2  0.2 - 0.9 K/uL Final  . Eosinophils Relative 11/23/2017 4  % Final  . Eosinophils Absolute 11/23/2017 0.1  0 - 0.7 K/uL Final  . Basophils Relative 11/23/2017 1  % Final  . Basophils Absolute 11/23/2017 0.0  0 - 0.1 K/uL Final   Performed at [facility], [address] [place], [place] [address]  . Retic Ct Pct 11/23/2017 1.1  0.4 - 3.1 % Final  . RBC. 11/23/2017 4.24  3.80 - 5.20 MIL/uL Final  . Retic Count, Absolute 11/23/2017 46.6  19.0 - 183.0 K/uL Final   Performed at [facility], [address], [place], [place] [address]  . LDH 11/23/2017 170  98 - 192 U/L Final   Performed at [facility], [address] [place], [place] [address]  . Ferritin 11/24/2017 30  11 - 307 ng/mL Final   Performed at [facility], [address] [place], [place] [address]  . Iron 11/24/2017 31  28 - 170 ug/dL Final  . TIBC 11/24/2017 249* 250 - 450 ug/dL Final  . Saturation Ratios 11/24/2017 13  10.4 - 31.8 % Final  . UIBC 11/24/2017 218  ug/dL Final   Performed at [facility], [address], [place], [place] [address]  . Polyspecific AHG test 11/23/2017     Final                   Value:NEG Performed at [facility], [address] [place], [place] [address]   . Prothrombin Time 11/23/2017 16.7* 11.4 - 15.2 seconds Final  . INR 11/23/2017 1.36   Final   Performed at [facility], [address] [place], [place] [address]  . aPTT 11/23/2017 39* 24 - 36 seconds Final   Comment:        IF BASELINE aPTT IS ELEVATED, SUGGEST PATIENT RISK ASSESSMENT BE USED TO DETERMINE APPROPRIATE ANTICOAGULANT THERAPY. Performed at [facility], [address] [place], [place] [address]   . Total Bilirubin 11/23/2017 1.2  0.3 - 1.2 mg/dL Final   Performed at [facility], [address] [place], [place] [address]  . Fecal Occult Bld 11/24/2017 NEGATIVE  NEGATIVE Final   Performed at [facility] [facility], [address] [place], [place] [address]  . PTT-LA Mix 11/23/2017 45.2  0.0 - 48.9 sec Final   Comment: (NOTE) Performed At: [facility] [address] [place], [place] [id number] Rush Farmer MD WS:[id number]   . PTT-LA Incub Mix 11/23/2017 47.3  0.0 - 48.9 sec Final   Comment: (NOTE) Performed At: [facility] [place], [place] [id number] Rush Farmer MD PR:[id number]     Assessment:  IRMALEE RIEMENSCHNEIDER is a 77 y.o. female with chronic thrombocytopeniasince 08/2013. She has mild lymphopenia. Dietis poor. No medications are implicated. She is on no herbal products. HIV testing in 06/2017 was negative.  Work-up in 11/2017 revealed the following normal studies:  B12, folate, ANA, hepatitis B core antibody, hepatitis C antibody, zinc level.  Pseudothrombocytopenia ruled out (platelet count in a blue top tube identical to purple top tube).  Copper level was 59 (72-166).  Coombs was negative.  No lupus anticoagulant.  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  Peripheral smear on 11/23/2017 revealed a  normocytic anemia with unremarkable RBC morphology.  There was leukopenia and absolute lymphopenia and neutropenia.  She is on several medications with a low incidence of myelosuppression:  Pepcid (< 1% leukopenia, thrombocytopenia), Seroquel (>= 1% leukopenia and < 1% thrombocytopenia), Phenergan ( ITP, leukopenia and thrombocytopenia Frequency Not Defined), and Lexapro (< 1% leukopenia and thrombocytopenia).  Abdominal ultrasoundon 11/21/2017 revealed a 14.5 x 7.5 cm x 13.1 cm spleen (volume 750 cm3) with mild distention of the portal vein suggestive of portal hypertension.  There was mild renal cortical thinning.  Abdominal plain films on 11/20/2017 revealed questionable mild rectal wall thickening.  There was RIGHT lung base opacity suspect pleural effusion and basilar atelectasis.  She has mild hypothyroidism on supplementation.    TSH was 6.287 (0.35 - 4.5) and free T4 0.80 (0.82 - 1.77).  Symptomatically, she feels "good today". She has struggled with depression since the death of her daughter 2 1/2 years ago. She denies any bleeding.  Exam is stable.  Plan: 1.Thrombocytopenia: Etiology of thrombocytopenia unclear.  Spleen slightly enlarged due to portal hypertension, although with the size of her spleen, wouldn't expect her platelet count would be < 100,000.  Negative/normal studies include:  B12, folate, ANA, hepatitis B core antibody and hepatitis C.  HIV testing was negative in 06/2017.    2.  Lymphocytopenia:  Etiology felt secondary to nutritional difficiency.  Copper level is low. Consider peripheral blood flow cytometry or bone marrow if counts do not recover.    2.  Questionable rectal wall thickening:   Anticipate outpatient EGD to assess dysphagia and sigmoidoscopy to assess rectal wall thickening noted on imaging.   No bleeding per patient.   Guaiac stools.   Lequita Asal, MD  12/01/2017, 3:44 AM   I saw and evaluated the patient, participating in  the key portions of the service and reviewing pertinent diagnostic studies and records.  I reviewed the nurse practitioner's note and agree with the findings and the plan.  The assessment and plan were discussed with the patient.  Additional diagnostic studies of *** are needed to clarify *** and would change the clinical management.  A few ***multiple questions were asked by the patient and answered.   Nolon Stalls, MD 12/01/2017,3:44 AM

## 2017-12-03 ENCOUNTER — Inpatient Hospital Stay: Payer: Medicare Other | Admitting: Oncology

## 2017-12-09 ENCOUNTER — Encounter: Payer: Self-pay | Admitting: *Deleted

## 2017-12-09 ENCOUNTER — Inpatient Hospital Stay: Payer: Medicare Other | Admitting: Oncology

## 2017-12-18 ENCOUNTER — Other Ambulatory Visit: Payer: Self-pay

## 2017-12-18 ENCOUNTER — Inpatient Hospital Stay: Payer: Medicare Other

## 2017-12-18 ENCOUNTER — Inpatient Hospital Stay: Payer: Medicare Other | Attending: Oncology | Admitting: Oncology

## 2017-12-18 ENCOUNTER — Encounter: Payer: Self-pay | Admitting: Oncology

## 2017-12-18 VITALS — BP 114/71 | HR 78 | Temp 96.6°F | Resp 16 | Wt 127.5 lb

## 2017-12-18 DIAGNOSIS — K219 Gastro-esophageal reflux disease without esophagitis: Secondary | ICD-10-CM | POA: Diagnosis not present

## 2017-12-18 DIAGNOSIS — K760 Fatty (change of) liver, not elsewhere classified: Secondary | ICD-10-CM | POA: Diagnosis not present

## 2017-12-18 DIAGNOSIS — E039 Hypothyroidism, unspecified: Secondary | ICD-10-CM

## 2017-12-18 DIAGNOSIS — Z800 Family history of malignant neoplasm of digestive organs: Secondary | ICD-10-CM | POA: Insufficient documentation

## 2017-12-18 DIAGNOSIS — E610 Copper deficiency: Secondary | ICD-10-CM | POA: Diagnosis not present

## 2017-12-18 DIAGNOSIS — F329 Major depressive disorder, single episode, unspecified: Secondary | ICD-10-CM | POA: Insufficient documentation

## 2017-12-18 DIAGNOSIS — Z803 Family history of malignant neoplasm of breast: Secondary | ICD-10-CM | POA: Diagnosis not present

## 2017-12-18 DIAGNOSIS — R531 Weakness: Secondary | ICD-10-CM | POA: Insufficient documentation

## 2017-12-18 DIAGNOSIS — D72819 Decreased white blood cell count, unspecified: Secondary | ICD-10-CM | POA: Insufficient documentation

## 2017-12-18 DIAGNOSIS — D696 Thrombocytopenia, unspecified: Secondary | ICD-10-CM

## 2017-12-18 DIAGNOSIS — R5383 Other fatigue: Secondary | ICD-10-CM

## 2017-12-18 DIAGNOSIS — Z79899 Other long term (current) drug therapy: Secondary | ICD-10-CM | POA: Insufficient documentation

## 2017-12-18 DIAGNOSIS — R630 Anorexia: Secondary | ICD-10-CM

## 2017-12-18 DIAGNOSIS — R161 Splenomegaly, not elsewhere classified: Secondary | ICD-10-CM

## 2017-12-18 DIAGNOSIS — D72810 Lymphocytopenia: Secondary | ICD-10-CM | POA: Diagnosis not present

## 2017-12-18 DIAGNOSIS — E669 Obesity, unspecified: Secondary | ICD-10-CM | POA: Insufficient documentation

## 2017-12-18 LAB — CBC WITH DIFFERENTIAL/PLATELET
Basophils Absolute: 0 10*3/uL (ref 0–0.1)
Basophils Relative: 1 %
Eosinophils Absolute: 0.1 10*3/uL (ref 0–0.7)
Eosinophils Relative: 3 %
HEMATOCRIT: 38.7 % (ref 35.0–47.0)
HEMOGLOBIN: 12.8 g/dL (ref 12.0–16.0)
Lymphocytes Relative: 21 %
Lymphs Abs: 0.5 10*3/uL — ABNORMAL LOW (ref 1.0–3.6)
MCH: 29.5 pg (ref 26.0–34.0)
MCHC: 33.2 g/dL (ref 32.0–36.0)
MCV: 88.8 fL (ref 80.0–100.0)
MONOS PCT: 8 %
Monocytes Absolute: 0.2 10*3/uL (ref 0.2–0.9)
NEUTROS ABS: 1.6 10*3/uL (ref 1.4–6.5)
NEUTROS PCT: 67 %
Platelets: 66 10*3/uL — ABNORMAL LOW (ref 150–440)
RBC: 4.35 MIL/uL (ref 3.80–5.20)
RDW: 18.3 % — ABNORMAL HIGH (ref 11.5–14.5)
WBC: 2.3 10*3/uL — AB (ref 3.6–11.0)

## 2017-12-18 LAB — TSH: TSH: 1.683 u[IU]/mL (ref 0.350–4.500)

## 2017-12-18 MED ORDER — MEGESTROL ACETATE 20 MG PO TABS: 20.0000 mg | ORAL_TABLET | Freq: Two times a day (BID) | ORAL | 0 refills | Status: DC

## 2017-12-18 NOTE — Progress Notes (Signed)
Patient here today as a new patient  

## 2017-12-18 NOTE — Progress Notes (Signed)
Hematology/Oncology Follow Up Note [facility]  [phone] [phone]  Patient Care Team: Kirk Ruths, MD as PCP - General (Internal Medicine)   Name of the patient: Cassidy [name]  [id number]  Sep 12, 1940   REASON FOR VISIT  follow-up of thrombocytopenia and leukopenia.  INTERVAL HISTORY Patient is new to me. Patient is a 77 year old female with chronic thrombocytopenia and severe depression who had a recent admission due to nausea vomiting and dehydration.  She was seen in the hospital by one of my colleague Dr. Mike Gip Patient prefers to switch physician and establish care with me.  Reviewed patient's previous labs, thrombocytopenia duration is chronic, dated back to 2015.  No aggravating or improving factors.  Associated symptoms: Patient denies fatigue, weight loss, easy bruising, hematochezia, hemoptysis.  History hepatitis or HIV infection. History of chronic liver disease denies Alcohol consumption denies  I reviewed lab work-up results obtained by Dr. Mike Gip Normal B12 and folate level.  Negative hepatitis panel.  HIV was done in March 2019 was negative.  ANA was negative. Occult blood stool cards are negative x1.  Normal LDH Pathology path review of peripheral blood showed normocytic anemia with unremarkable RBC morphology.  Leukopenia is with absolute lymphopenia and neutropenia.  Unremarkable WBC morphology.  Thrombocytopenia with unremarkable platelet morphology. Copper level was reduced at 59 [normal reference 72-66] Normal zinc level. TSH was elevated at 6.287 with decreased T3 and T4 level.  Patient takes Synthroid, dosed was increased to 100 MCG daily.   Patient was accompanied by her sister.  She reports feeling very tired and fatigued.  Poor appetite.  Per sister patient has not eat well for the past 2 years. Reports her mood is depressed and she takes antidepressants.  Follows up with psychiatrist.  Per  patient's sister, since patient's daughter passed away a while ago patient has not been eating normally.  Patient lives in her apartment with her grandson who is 15 years old.  Patient's family members prepare food for patient.  Denies any alcohol use.  Endorses easy bruising.  Denies any active bleeding events.  Review of Systems  Constitutional: Positive for malaise/fatigue and weight loss. Negative for chills and fever.  HENT: Negative for nosebleeds and sore throat.   Eyes: Negative for double vision, photophobia and redness.  Respiratory: Negative for cough, shortness of breath and wheezing.   Cardiovascular: Negative for chest pain, palpitations and orthopnea.  Gastrointestinal: Negative for abdominal pain, blood in stool, nausea and vomiting.  Genitourinary: Negative for dysuria.  Musculoskeletal: Negative for back pain, myalgias and neck pain.  Skin: Negative for itching and rash.  Neurological: Positive for weakness. Negative for dizziness, tingling and tremors.  Endo/Heme/Allergies: Negative for environmental allergies. Bruises/bleeds easily.  Psychiatric/Behavioral: Negative for depression.      Allergies  Allergen Reactions  . Sulfa Antibiotics Shortness Of Breath     Past Medical History:  Diagnosis Date  . Anxiety   . Depression   . Fatty liver   . GERD (gastroesophageal reflux disease)   . Hypothyroidism   . Obesity   . Thyroid disease      Past Surgical History:  Procedure Laterality Date  . APPENDECTOMY    . ESOPHAGOGASTRODUODENOSCOPY (EGD) WITH PROPOFOL N/A 12/20/2014   Procedure: ESOPHAGOGASTRODUODENOSCOPY (EGD) WITH PROPOFOL;  Surgeon: Manya Silvas, MD;  Location: [facility] ENDOSCOPY;  Service: Endoscopy;  Laterality: N/A;  . SAVORY DILATION N/A 12/20/2014   Procedure: SAVORY DILATION;  Surgeon: Manya Silvas, MD;  Location: [facility]  ENDOSCOPY;  Service: Endoscopy;  Laterality: N/A;  . TONSILLECTOMY      Social History   Socioeconomic History  .  Marital status: Widowed    Spouse name: Not on file  . Number of children: Not on file  . Years of education: Not on file  . Highest education level: Not on file  Occupational History  . Not on file  Social Needs  . Financial resource strain: Not on file  . Food insecurity:    Worry: Not on file    Inability: Not on file  . Transportation needs:    Medical: Not on file    Non-medical: Not on file  Tobacco Use  . Smoking status: Never Smoker  . Smokeless tobacco: Never Used  Substance and Sexual Activity  . Alcohol use: No  . Drug use: No  . Sexual activity: Not Currently    Birth control/protection: Post-menopausal  Lifestyle  . Physical activity:    Days per week: Not on file    Minutes per session: Not on file  . Stress: Not on file  Relationships  . Social connections:    Talks on phone: Not on file    Gets together: Not on file    Attends religious service: Not on file    Active member of club or organization: Not on file    Attends meetings of clubs or organizations: Not on file    Relationship status: Not on file  . Intimate partner violence:    Fear of current or ex partner: Not on file    Emotionally abused: Not on file    Physically abused: Not on file    Forced sexual activity: Not on file  Other Topics Concern  . Not on file  Social History Narrative  . Not on file    Family History  Problem Relation Age of Onset  . Diabetes Mother   . COPD Mother   . Kidney disease Mother   . Depression Mother   . Heart attack Father   . Aneurysm Father   . Anxiety disorder Sister   . Depression Sister   . Diabetes Sister   . Hypertension Sister   . Atrial fibrillation Brother   . Spinal muscular atrophy Brother   . Breast cancer Sister   . Bone cancer Sister   . Depression Sister   . Anxiety disorder Sister   . Breast cancer Sister   . Colon cancer Sister   . Depression Sister   . Diabetes Sister   . COPD Sister   . Depression Sister   . Anxiety  disorder Sister   . Diabetes Brother   . Depression Brother      Current Outpatient Medications:  .  acidophilus (RISAQUAD) CAPS capsule, Take 1 capsule by mouth 2 (two) times daily., Disp: , Rfl:  .  busPIRone (BUSPAR) 15 MG tablet, Take 1 tablet (15 mg total) by mouth at bedtime. (Patient taking differently: Take 15 mg by mouth 2 (two) times daily. ), Disp: 30 tablet, Rfl: 4 .  escitalopram (LEXAPRO) 20 MG tablet, Take 10 mg by mouth at bedtime., Disp: , Rfl:  .  fluticasone (FLONASE) 50 MCG/ACT nasal spray, Place 2 sprays into both nostrils daily., Disp: 16 g, Rfl: 2 .  levothyroxine (SYNTHROID, LEVOTHROID) 100 MCG tablet, Take 1 tablet (100 mcg total) by mouth daily before breakfast., Disp: 30 tablet, Rfl: 0 .  Melatonin 1 MG TABS, Take 5 mg by mouth at bedtime as needed., Disp: ,  Rfl:  .  omeprazole (PRILOSEC) 40 MG capsule, Take 1 capsule (40 mg total) by mouth daily., Disp: 30 capsule, Rfl: 1 .  ondansetron (ZOFRAN ODT) 4 MG disintegrating tablet, Take 1 tablet (4 mg total) by mouth every 8 (eight) hours as needed for nausea or vomiting., Disp: 25 tablet, Rfl: 0 .  potassium chloride (K-DUR) 10 MEQ tablet, Take 10 mEq by mouth daily., Disp: , Rfl:  .  QUEtiapine (SEROQUEL) 100 MG tablet, Take 1 tablet (100 mg total) by mouth at bedtime., Disp: 30 tablet, Rfl: 4 .  traZODone (DESYREL) 150 MG tablet, Take 200 mg by mouth at bedtime., Disp: , Rfl:  .  XIFAXAN 550 MG TABS tablet, Take 1 tablet by mouth 2 (two) times daily., Disp: , Rfl:   Physical exam:  Vitals:   12/18/17 1354  BP: 114/71  Pulse: 78  Resp: 16  Temp: (!) 96.6 F (35.9 C)  TempSrc: Tympanic  Weight: 127 lb 8 oz (57.8 kg)   Physical Exam  Constitutional: She is oriented to person, place, and time. No distress.  Frail appearance elderly female sitting in wheelchair.  HENT:  Head: Normocephalic and atraumatic.  Mouth/Throat: Oropharynx is clear and moist.  Eyes: Pupils are equal, round, and reactive to light. EOM  are normal. No scleral icterus.  Neck: Normal range of motion. Neck supple.  Cardiovascular: Normal rate and normal heart sounds.  Irregular heartbeats  Pulmonary/Chest: Effort normal. No respiratory distress.  Decreased breath sound bilaterally, right > left  Abdominal: Soft. Bowel sounds are normal. She exhibits no distension. There is no tenderness.  Musculoskeletal: Normal range of motion. She exhibits no edema or deformity.  Neurological: She is alert and oriented to person, place, and time. No cranial nerve deficit. Coordination normal.  Skin: Skin is warm and dry. No rash noted. No erythema.  Psychiatric: She has a normal mood and affect.    CMP Latest Ref Rng & Units 11/24/2017  Glucose 70 - 99 mg/dL 95  BUN 8 - 23 mg/dL 10  Creatinine 0.44 - 1.00 mg/dL 0.48  Sodium 135 - 145 mmol/L 143  Potassium 3.5 - 5.1 mmol/L 3.7  Chloride 98 - 111 mmol/L 112(H)  CO2 22 - 32 mmol/L 28  Calcium 8.9 - 10.3 mg/dL 8.1(L)  Total Protein 6.5 - 8.1 g/dL -  Total Bilirubin 0.3 - 1.2 mg/dL -  Alkaline Phos 38 - 126 U/L -  AST 15 - 41 U/L -  ALT 0 - 44 U/L -   CBC Latest Ref Rng & Units 11/24/2017  WBC 3.6 - 11.0 K/uL 1.7(L)  Hemoglobin 12.0 - 16.0 g/dL 11.7(L)  Hematocrit 35.0 - 47.0 % 33.9(L)  Platelets 150 - 440 K/uL 47(L)   RADIOGRAPHIC STUDIES: I have personally reviewed the radiological images as listed and agreed with the findings in the report. US Abdomen Complete  Result Date: 11/21/2017 CLINICAL DATA:  Persistent vomiting. Acute on chronic thrombocytopenia. History of fatty liver with elevated total bilirubin. EXAM: ABDOMEN ULTRASOUND COMPLETE COMPARISON:  Ultrasound 07/29/2013 FINDINGS: Gallbladder: No gallstones or wall thickening visualized. No sonographic Murphy sign noted by sonographer. Common bile duct: Diameter: 5 mm Liver: No focal lesion identified. Within normal limits in parenchymal echogenicity. Portal vein is patent on color Doppler imaging with normal direction of blood  flow towards the liver. The portal vein is mildly distended with a diameter of 1.3 cm. IVC: No abnormality visualized. Pancreas: Visualized portion unremarkable. Spleen: Progressive enlargement, measuring 14.5 x 7.5 x 13.1 cm (volume =  750 cm^3). No focal abnormality. Right Kidney: Length: 10.8 cm. Mild renal cortical thinning and prominence of the renal sinus fat. No hydronephrosis. Left Kidney: Length: 11.2 cm. Mild renal cortical thinning and prominence of the renal sinus fat. No hydronephrosis. Abdominal aorta: No aneurysm visualized. Other findings: Small right pleural effusion noted. No significant ascites. IMPRESSION: 1. No evidence of hepatic or biliary disease. 2. Dilated portal vein and splenomegaly, suggesting portal hypertension. 3. Mild renal cortical thinning bilaterally. Electronically Signed   By: Richardean Sale M.D.   On: 11/21/2017 08:06   Dg Abd 2 Views  Result Date: 11/20/2017 CLINICAL DATA:  Dehydration, vomiting since yesterday EXAM: ABDOMEN - 2 VIEW COMPARISON:  06/19/2017 FINDINGS: RIGHT basilar opacity question combination of effusion and atelectasis. Nonobstructive bowel gas pattern. No evidence of bowel obstruction or dilatation. Questionable mild wall thickening of the rectum. Bones demineralized with dextroconvex scoliosis and suspect superior endplate compression deformity of L2 vertebral body. No urinary tract calcification. IMPRESSION: Nonobstructive bowel gas pattern. Questionable mild rectal wall thickening, recommend correlation with proctoscopy. RIGHT lung base opacity suspect pleural effusion and basilar atelectasis. Electronically Signed   By: Lavonia Dana M.D.   On: 11/20/2017 16:29      Assessment and plan Patient is a 77 y.o. female with history of depression, chronic thrombocytopenia present for follow-up of leukopenia and thrombocytopenia. 1. Lymphocytopenia   2. Thrombocytopenia ([place])   3. Hypothyroidism, unspecified type   4. Copper deficiency   5. Other  fatigue   6. Poor appetite   7. Splenomegaly    Labs reviewed and discussed with patient and her sister.  Labs showed she has mild copper deficiency which can contribute to her anemia and leukopenia.  Very rarely copper deficiency lead to thrombocytopenia.  I recommend patient start taking over-the-counter copper supplements   #Poor appetite, start trial of Megace 20 mg twice daily. Regarding patient's thrombocytopenia and leukopenia, I recommend repeat CBC today, and check flow cytometry. Suspect she may have underlying malignant bone marrow disorders, i.e. MDS, etc.  We discussed about doing bone marrow aspiration for further evaluation if above tests nonconclusive.  Patient declined Will continue monitor her counts and provide supportive care.  # Hypothyroidism, her Synthroid dose has recently been adjusted to 100 MCG due to elevated TSH.  We will continue to monitor.  Repeat TSH today.  Patient request for review.  Advised patient to follow-up with PCP in the future for refills.  I will refill her Synthroid for this time.  # Splenomegaly, ultrasound of abdomen was independently reviewed by me and discussed with patient.  Patient has splenomegaly, measures 14.5 x 7.5 x 13.1.  This can contribute to her thrombocytopenia.  Etiology of splenomegaly is not clear.  Liver showed normal echogenicity Portal vein is patent, mildly distended with a diameter of 1.3 cm.  Questionable portal hypertension.  Patient denies any history of chronic liver disease, hepatitis negative.  We spent sufficient time to discuss many aspect of care, questions were answered to patient's satisfaction. Orders Placed This Encounter  Procedures  . CBC with Differential/Platelet    Standing Status:   Future    Standing Expiration Date:   12/19/2018  . Flow cytometry panel-leukemia/lymphoma work-up    Standing Status:   Future    Standing Expiration Date:   12/19/2018   Follow up in 4 weeks.   Total face to face encounter  time for this patient visit was 45 min. >50% of the time was  spent in counseling and coordination  of care.    Earlie Server, MD, PhD Hematology Oncology [place] [facility] Pager- [phone] 12/18/2017

## 2017-12-23 LAB — COMP PANEL: LEUKEMIA/LYMPHOMA

## 2017-12-29 ENCOUNTER — Telehealth: Payer: Self-pay

## 2017-12-29 DIAGNOSIS — F330 Major depressive disorder, recurrent, mild: Secondary | ICD-10-CM

## 2017-12-29 MED ORDER — QUETIAPINE FUMARATE 100 MG PO TABS: 150.0000 mg | ORAL_TABLET | Freq: Every day | ORAL | 1 refills | Status: DC

## 2017-12-29 NOTE — Telephone Encounter (Signed)
Called patient - Cassidy [name] her sister picked up the phone . Discussed increasing her seroquel , will send it to her [facility]. Discussed making a sooner appointment to be seen in clinic if she is still having sleep issues and anxiety sx. Also discussed to take her to the nearest ED if she is in a crisis.

## 2017-12-29 NOTE — Telephone Encounter (Signed)
pt states that she can not slept in 2 day and 2 nights and now she hyperventilating.

## 2018-01-05 ENCOUNTER — Other Ambulatory Visit: Payer: Self-pay

## 2018-01-05 ENCOUNTER — Ambulatory Visit (INDEPENDENT_AMBULATORY_CARE_PROVIDER_SITE_OTHER): Payer: Medicare Other | Admitting: Psychiatry

## 2018-01-05 ENCOUNTER — Encounter: Payer: Self-pay | Admitting: Psychiatry

## 2018-01-05 VITALS — BP 130/73 | HR 99 | Temp 98.7°F | Wt 127.2 lb

## 2018-01-05 DIAGNOSIS — F411 Generalized anxiety disorder: Secondary | ICD-10-CM

## 2018-01-05 DIAGNOSIS — F5105 Insomnia due to other mental disorder: Secondary | ICD-10-CM

## 2018-01-05 DIAGNOSIS — F330 Major depressive disorder, recurrent, mild: Secondary | ICD-10-CM

## 2018-01-05 MED ORDER — BUSPIRONE HCL 15 MG PO TABS: 15.0000 mg | ORAL_TABLET | Freq: Every day | ORAL | 1 refills | Status: DC

## 2018-01-05 MED ORDER — QUETIAPINE FUMARATE 100 MG PO TABS: 100.0000 mg | ORAL_TABLET | Freq: Every day | ORAL | 1 refills | Status: DC

## 2018-01-05 MED ORDER — ESCITALOPRAM OXALATE 10 MG PO TABS: 10.0000 mg | ORAL_TABLET | Freq: Every day | ORAL | 1 refills | Status: DC

## 2018-01-05 MED ORDER — TRAZODONE HCL 150 MG PO TABS: 150.0000 mg | ORAL_TABLET | Freq: Every day | ORAL | 1 refills | Status: DC

## 2018-01-05 MED ORDER — TRAZODONE HCL 50 MG PO TABS: 50.0000 mg | ORAL_TABLET | Freq: Every evening | ORAL | 1 refills | Status: DC | PRN

## 2018-01-05 MED ORDER — CLONAZEPAM 0.5 MG PO TABS: 0.5000 mg | ORAL_TABLET | Freq: Every day | ORAL | 1 refills | Status: DC | PRN

## 2018-01-05 NOTE — Progress Notes (Signed)
[facility] MD  OP Progress Note  01/05/2018 2:51 PM Cassidy [name]  MRN:  [id number]  Chief Complaint: ' I am here for follow up." Chief Complaint    Follow-up; Medication Refill     HPI: Cassidy [name] is a 77 year old Caucasian female who has a history of major depressive disorder, generalized anxiety disorder, panic disorder, insomnia, lives in [place] with her grandson Cassidy [name], presented to the clinic today for a follow-up visit.  Patient today presented with her Sister Cassidy [name].  Patient presented in a wheelchair.  She appears to be alert and oriented.  She was able to tell me the day, the month and the year.  Her sister also provided collateral information.  Per sister patient was recently diagnosed with lymphocytopenia, thrombocytopenia, copper deficiency, splenomegaly, fatigue and so on.  It was advised by her oncologist to possibly consider doing a bone marrow biopsy to rule out underlying malignant bone marrow disorders like MDS.  However patient refused.  Per sister family also feels the same since even if they are able to diagnose her with malignancy she may not be able to withstand chemotherapy or radiation.  Patient continues to struggle with poor appetite on and off as well as malaise and fatigue.  Patient needs help with dressing, getting up from her chair and so on.  Per sister, she spends several hours a day with her sister and her grandson stays in the same house and is home at night.  Patient had struggled with sleep few weeks ago however sleep is getting better and she had a good night sleep last night.  She however refused to increase the Seroquel to a higher dosage.  She continues to stay on the 100 mg.  She continues to take trazodone.  Per sister patient has some on and off anxiety symptoms and has been hyperventilating.  She wonders if there is any medication she can take as needed when she has these attacks.  Discussed Klonopin as needed.  Discussed to use it only as needed only for severe  anxiety attacks.  Patient denies any suicidality.  Patient denies any perceptual disturbances.  Patient denies any homicidality.  Discussed possible referral to counselor for psychotherapy sessions, however patient declined.   Visit Diagnosis:    ICD-10-CM   1. MDD (major depressive disorder), recurrent episode, mild (HCC) F33.0 QUEtiapine (SEROQUEL) 100 MG tablet  2. GAD (generalized anxiety disorder) F41.1   3. Insomnia due to mental disorder F51.05     Past Psychiatric History: Have reviewed past psychiatric history from my progress note on 06/23/2017  Past Medical History:  Past Medical History:  Diagnosis Date  . Anxiety   . Depression   . Fatty liver   . GERD (gastroesophageal reflux disease)   . Hypothyroidism   . Obesity   . Thyroid disease     Past Surgical History:  Procedure Laterality Date  . APPENDECTOMY    . ESOPHAGOGASTRODUODENOSCOPY (EGD) WITH PROPOFOL N/A 12/20/2014   Procedure: ESOPHAGOGASTRODUODENOSCOPY (EGD) WITH PROPOFOL;  Surgeon: Manya Silvas, MD;  Location: [facility] ENDOSCOPY;  Service: Endoscopy;  Laterality: N/A;  . SAVORY DILATION N/A 12/20/2014   Procedure: SAVORY DILATION;  Surgeon: Manya Silvas, MD;  Location: [facility] ENDOSCOPY;  Service: Endoscopy;  Laterality: N/A;  . TONSILLECTOMY      Family Psychiatric History: I have reviewed family psychiatric history from my progress note on 06/23/2017  Family History:  Family History  Problem Relation Age of Onset  . Diabetes Mother   . COPD Mother   .  Kidney disease Mother   . Depression Mother   . Hypertension Mother   . Heart attack Father   . Aneurysm Father   . Anxiety disorder Sister   . Depression Sister   . Diabetes Sister   . Hypertension Sister   . Atrial fibrillation Brother   . Spinal muscular atrophy Brother   . Breast cancer Sister   . Bone cancer Sister   . Depression Sister   . Anxiety disorder Sister   . Liver cancer Sister   . Breast cancer Sister   . Colon cancer Sister    . Depression Sister   . Diabetes Sister   . COPD Sister   . Depression Sister   . Anxiety disorder Sister   . Skin cancer Sister   . Diabetes Brother   . Depression Brother   . Skin cancer Maternal Aunt   . Diabetes Maternal Aunt   . Cervical cancer Paternal Aunt     Social History: Reviewed social history from my progress note on 06/23/2017 Social History   Socioeconomic History  . Marital status: Widowed    Spouse name: Not on file  . Number of children: 3  . Years of education: Not on file  . Highest education level: Not on file  Occupational History  . Not on file  Social Needs  . Financial resource strain: Not on file  . Food insecurity:    Worry: Not on file    Inability: Not on file  . Transportation needs:    Medical: Not on file    Non-medical: Not on file  Tobacco Use  . Smoking status: Never Smoker  . Smokeless tobacco: Never Used  Substance and Sexual Activity  . Alcohol use: No  . Drug use: No  . Sexual activity: Not Currently    Birth control/protection: Post-menopausal  Lifestyle  . Physical activity:    Days per week: Not on file    Minutes per session: Not on file  . Stress: Not on file  Relationships  . Social connections:    Talks on phone: Not on file    Gets together: Not on file    Attends religious service: Not on file    Active member of club or organization: Not on file    Attends meetings of clubs or organizations: Not on file    Relationship status: Not on file  Other Topics Concern  . Not on file  Social History Narrative  . Not on file    Allergies:  Allergies  Allergen Reactions  . Sulfa Antibiotics Shortness Of Breath    Metabolic Disorder Labs: Lab Results  Component Value Date   HGBA1C 5.3 06/21/2016   MPG 105 06/21/2016   No results found for: PROLACTIN Lab Results  Component Value Date   CHOL 145 06/21/2016   TRIG 51 06/21/2016   HDL 44 06/21/2016   CHOLHDL 3.3 06/21/2016   VLDL 10 06/21/2016   LDLCALC  91 06/21/2016   LDLCALC 70 08/22/2013   Lab Results  Component Value Date   TSH 1.683 12/18/2017   TSH 6.287 (H) 11/20/2017    Therapeutic Level Labs: No results found for: LITHIUM No results found for: VALPROATE No components found for:  CBMZ  Current Medications: Current Outpatient Medications  Medication Sig Dispense Refill  . acidophilus (RISAQUAD) CAPS capsule Take 1 capsule by mouth 2 (two) times daily.    . fluticasone (FLONASE) 50 MCG/ACT nasal spray Place 2 sprays into both nostrils daily. 16 g  2  . levothyroxine (SYNTHROID, LEVOTHROID) 100 MCG tablet Take 1 tablet (100 mcg total) by mouth daily before breakfast. 30 tablet 0  . megestrol (MEGACE) 20 MG tablet Take 1 tablet (20 mg total) by mouth 2 (two) times daily. 60 tablet 0  . Melatonin 1 MG TABS Take 5 mg by mouth at bedtime as needed.    Marland Kitchen omeprazole (PRILOSEC) 40 MG capsule Take 1 capsule (40 mg total) by mouth daily. 30 capsule 1  . ondansetron (ZOFRAN ODT) 4 MG disintegrating tablet Take 1 tablet (4 mg total) by mouth every 8 (eight) hours as needed for nausea or vomiting. 25 tablet 0  . potassium chloride (K-DUR) 10 MEQ tablet Take 10 mEq by mouth daily.    . QUEtiapine (SEROQUEL) 100 MG tablet Take 1 tablet (100 mg total) by mouth at bedtime. 90 tablet 1  . traZODone (DESYREL) 150 MG tablet Take 1 tablet (150 mg total) by mouth at bedtime. 90 tablet 1  . XIFAXAN 550 MG TABS tablet Take 1 tablet by mouth 2 (two) times daily.    . busPIRone (BUSPAR) 15 MG tablet Take 1 tablet (15 mg total) by mouth at bedtime. 90 tablet 1  . clonazePAM (KLONOPIN) 0.5 MG tablet Take 1 tablet (0.5 mg total) by mouth daily as needed for anxiety. Only for severe anxiety sx 20 tablet 1  . escitalopram (LEXAPRO) 10 MG tablet Take 1 tablet (10 mg total) by mouth daily. 90 tablet 1  . traZODone (DESYREL) 50 MG tablet Take 1 tablet (50 mg total) by mouth at bedtime as needed for sleep. To be combined with 150 mg as needed 90 tablet 1   No  current facility-administered medications for this visit.      Musculoskeletal: Strength & Muscle Tone: limited Gait & Station: in a wheelchair Patient leans: N/A  Psychiatric Specialty Exam: Review of Systems  Psychiatric/Behavioral: The patient is nervous/anxious and has insomnia.   All other systems reviewed and are negative.   Blood pressure 130/73, pulse 99, temperature 98.7 F (37.1 C), temperature source Oral, weight 127 lb 3.2 oz (57.7 kg).Body mass index is 24.84 kg/m.  General Appearance: Casual  Eye Contact:  Fair  Speech:  Clear and Coherent  Volume:  Normal  Mood:  Anxious  Affect:  Congruent  Thought Process:  Goal Directed and Descriptions of Associations: Intact  Orientation:  Full (Time, Place, and Person)  Thought Content: Logical   Suicidal Thoughts:  No  Homicidal Thoughts:  No  Memory:  Immediate;   Fair Recent;   Fair Remote;   limited  Judgement:  Fair  Insight:  Fair  Psychomotor Activity:  Decreased  Concentration:  Concentration: Fair and Attention Span: Fair  Recall:  [employer] of Knowledge: Fair  Language: Fair  Akathisia:  No  Handed:  Right  AIMS (if indicated):0  Assets:  Communication Skills Desire for Improvement Social Support  ADL's:  Intact  Cognition: WNL  Sleep:  improving   Screenings: AUDIT     Admission (Discharged) from 06/20/2016 in [place]  Alcohol Use Disorder Identification Test Final Score (AUDIT)  0    ECT-MADRS     ECT Treatment from 07/11/2016 in [place] Admission (Discharged) from 06/20/2016 in [place] Total Score  16  16    Mini-Mental     ECT Treatment from 07/11/2016 in [place] Admission (Discharged) from 06/20/2016 in [place]  MEDICINE  Total Score (max 30 points )  30  28       Assessment and Plan: Lanyah is a 77 year old Caucasian female, who has a history  of depression, anxiety, insomnia, recent thrombocytopenia lymphocytopenia, but deficiency, presented to the clinic today for a follow-up visit.  Patient presented along with her Sister Cassidy [name].  Patient continues to struggles with some fatigue and sleep issues and anxiety symptoms more so due to her recent medical problems.  Discussed medication changes as noted below.  Plan Depression Increase Lexapro to 10 mg p.o. daily.  Discussed with patient and family to reduce the dosage back to 5 mg if she has any side effects of increased fatigue. Continue Seroquel at previous dosage of 100 mg p.o. nightly.  Even though the dosage was increased to 150 mg recently she continues to take the 100 mg.  For anxiety symptoms BuSpar 15 mg p.o. Nightly And Klonopin 0.5 mg p.o. daily as needed only for severe anxiety symptoms.  Discussed the risk of being on benzodiazepine therapy long-term including cognitive problems dizziness and falls.  Discussed with patient as well as family to limit use as much as possible.  For insomnia Trazodone 150 mg p.o. nightly Add trazodone 50 mg p.o. nightly as needed for those nights when she cannot sleep Seroquel 100 mg p.o. nightly   Discussed possible referral for psychotherapy, patient declined.  Follow up in clinic in 1 month or sooner if needed.  More than 50 % of the time was spent for psychoeducation and supportive psychotherapy and care coordination.  This note was generated in part or whole with voice recognition software. Voice recognition is usually quite accurate but there are transcription errors that can and very often do occur. I apologize for any typographical errors that were not detected and corrected.      Ursula Alert, MD 01/05/2018, 2:51 PM

## 2018-01-14 ENCOUNTER — Other Ambulatory Visit: Payer: Self-pay

## 2018-01-14 ENCOUNTER — Inpatient Hospital Stay: Payer: Medicare Other | Attending: Oncology

## 2018-01-14 ENCOUNTER — Inpatient Hospital Stay (HOSPITAL_BASED_OUTPATIENT_CLINIC_OR_DEPARTMENT_OTHER): Payer: Medicare Other | Admitting: Oncology

## 2018-01-14 VITALS — BP 136/76 | HR 98 | Temp 98.9°F | Resp 18 | Wt 129.7 lb

## 2018-01-14 DIAGNOSIS — Z803 Family history of malignant neoplasm of breast: Secondary | ICD-10-CM

## 2018-01-14 DIAGNOSIS — E039 Hypothyroidism, unspecified: Secondary | ICD-10-CM | POA: Diagnosis not present

## 2018-01-14 DIAGNOSIS — Z79899 Other long term (current) drug therapy: Secondary | ICD-10-CM | POA: Insufficient documentation

## 2018-01-14 DIAGNOSIS — D72819 Decreased white blood cell count, unspecified: Secondary | ICD-10-CM | POA: Diagnosis not present

## 2018-01-14 DIAGNOSIS — R630 Anorexia: Secondary | ICD-10-CM

## 2018-01-14 DIAGNOSIS — F419 Anxiety disorder, unspecified: Secondary | ICD-10-CM | POA: Diagnosis not present

## 2018-01-14 DIAGNOSIS — R531 Weakness: Secondary | ICD-10-CM | POA: Insufficient documentation

## 2018-01-14 DIAGNOSIS — E669 Obesity, unspecified: Secondary | ICD-10-CM

## 2018-01-14 DIAGNOSIS — F329 Major depressive disorder, single episode, unspecified: Secondary | ICD-10-CM | POA: Insufficient documentation

## 2018-01-14 DIAGNOSIS — R0689 Other abnormalities of breathing: Secondary | ICD-10-CM

## 2018-01-14 DIAGNOSIS — K219 Gastro-esophageal reflux disease without esophagitis: Secondary | ICD-10-CM | POA: Insufficient documentation

## 2018-01-14 DIAGNOSIS — R634 Abnormal weight loss: Secondary | ICD-10-CM | POA: Diagnosis not present

## 2018-01-14 DIAGNOSIS — R161 Splenomegaly, not elsewhere classified: Secondary | ICD-10-CM | POA: Diagnosis not present

## 2018-01-14 DIAGNOSIS — R5383 Other fatigue: Secondary | ICD-10-CM | POA: Diagnosis not present

## 2018-01-14 DIAGNOSIS — Z800 Family history of malignant neoplasm of digestive organs: Secondary | ICD-10-CM

## 2018-01-14 DIAGNOSIS — D696 Thrombocytopenia, unspecified: Secondary | ICD-10-CM | POA: Diagnosis not present

## 2018-01-14 DIAGNOSIS — R0989 Other specified symptoms and signs involving the circulatory and respiratory systems: Secondary | ICD-10-CM

## 2018-01-14 DIAGNOSIS — D72810 Lymphocytopenia: Secondary | ICD-10-CM

## 2018-01-14 LAB — COMPREHENSIVE METABOLIC PANEL
ALK PHOS: 84 U/L (ref 38–126)
ALT: 16 U/L (ref 0–44)
ANION GAP: 4 — AB (ref 5–15)
AST: 36 U/L (ref 15–41)
Albumin: 3.2 g/dL — ABNORMAL LOW (ref 3.5–5.0)
BUN: 11 mg/dL (ref 8–23)
CALCIUM: 8.5 mg/dL — AB (ref 8.9–10.3)
CO2: 21 mmol/L — ABNORMAL LOW (ref 22–32)
CREATININE: 0.68 mg/dL (ref 0.44–1.00)
Chloride: 112 mmol/L — ABNORMAL HIGH (ref 98–111)
Glucose, Bld: 191 mg/dL — ABNORMAL HIGH (ref 70–99)
Potassium: 3.8 mmol/L (ref 3.5–5.1)
Sodium: 137 mmol/L (ref 135–145)
TOTAL PROTEIN: 6.5 g/dL (ref 6.5–8.1)
Total Bilirubin: 1.2 mg/dL (ref 0.3–1.2)

## 2018-01-14 LAB — CBC WITH DIFFERENTIAL/PLATELET
BASOS ABS: 0 10*3/uL (ref 0–0.1)
BASOS PCT: 1 %
EOS ABS: 0.1 10*3/uL (ref 0–0.7)
Eosinophils Relative: 2 %
HCT: 37.8 % (ref 35.0–47.0)
Hemoglobin: 12.6 g/dL (ref 12.0–16.0)
Lymphocytes Relative: 14 %
Lymphs Abs: 0.4 10*3/uL — ABNORMAL LOW (ref 1.0–3.6)
MCH: 30.1 pg (ref 26.0–34.0)
MCHC: 33.4 g/dL (ref 32.0–36.0)
MCV: 90.3 fL (ref 80.0–100.0)
MONO ABS: 0.2 10*3/uL (ref 0.2–0.9)
MONOS PCT: 8 %
Neutro Abs: 2.4 10*3/uL (ref 1.4–6.5)
Neutrophils Relative %: 75 %
PLATELETS: 77 10*3/uL — AB (ref 150–440)
RBC: 4.19 MIL/uL (ref 3.80–5.20)
RDW: 18.8 % — AB (ref 11.5–14.5)
WBC: 3.1 10*3/uL — ABNORMAL LOW (ref 3.6–11.0)

## 2018-01-14 NOTE — Progress Notes (Signed)
Patient here for follow up. No concerns voiced.  °

## 2018-01-16 ENCOUNTER — Encounter: Payer: Self-pay | Admitting: Oncology

## 2018-01-16 NOTE — Progress Notes (Signed)
Hematology/Oncology Follow Up Note [facility]  [phone] [phone]  Patient Care Team: Kirk Ruths, MD as PCP - General (Internal Medicine)   Name of the patient: Cassidy [name]  [id number]  May 21, 1940   REASON FOR VISIT  follow-up of thrombocytopenia and leukopenia.  INTERVAL HISTORY Patient is new to me. Patient is a 77 year old female with chronic thrombocytopenia and severe depression who had a recent admission due to nausea vomiting and dehydration.  She was seen in the hospital by one of my colleague Dr. Mike Gip Patient prefers to switch physician and establish care with me.  Reviewed patient's previous labs, thrombocytopenia duration is chronic, dated back to 2015.  No aggravating or improving factors.  Associated symptoms: Patient denies fatigue, weight loss, easy bruising, hematochezia, hemoptysis.  History hepatitis or HIV infection. History of chronic liver disease denies Alcohol consumption denies  I reviewed lab work-up results obtained by Dr. Mike Gip Normal B12 and folate level.  Negative hepatitis panel.  HIV was done in March 2019 was negative.  ANA was negative. Occult blood stool cards are negative x1.  Normal LDH Pathology path review of peripheral blood showed normocytic anemia with unremarkable RBC morphology.  Leukopenia is with absolute lymphopenia and neutropenia.  Unremarkable WBC morphology.  Thrombocytopenia with unremarkable platelet morphology. Copper level was reduced at 59 [normal reference 72-66] Normal zinc level. TSH was elevated at 6.287 with decreased T3 and T4 level.  Patient takes Synthroid, dosed was increased to 100 MCG daily.   Patient was accompanied by her sister.  She reports feeling very tired and fatigued.  Poor appetite.  Per sister patient has not eat well for the past 2 years. Reports her mood is depressed and she takes antidepressants.  Follows up with psychiatrist.  Per  patient's sister, since patient's daughter passed away a while ago patient has not been eating normally.  Patient lives in her apartment with her grandson who is 60 years old.  Patient's family members prepare food for patient.  Denies any alcohol use.  Endorses easy bruising.  Denies any active bleeding events.  INTERVAL HISTORY Cassidy [name] is a 77 y.o. female who has above history reviewed by me today presents for follow up visit for management of thrombocytopenia and leukopenia. She has had lab work up done and presents to discuss lab result and management plan.  No new compliant.   Review of Systems  Constitutional: Positive for malaise/fatigue and weight loss. Negative for chills and fever.  HENT: Negative for nosebleeds and sore throat.   Eyes: Negative for double vision, photophobia and redness.  Respiratory: Negative for cough, shortness of breath and wheezing.   Cardiovascular: Negative for chest pain, palpitations and orthopnea.  Gastrointestinal: Negative for abdominal pain, blood in stool, nausea and vomiting.  Genitourinary: Negative for dysuria.  Musculoskeletal: Negative for back pain, myalgias and neck pain.  Skin: Negative for itching and rash.  Neurological: Positive for weakness. Negative for dizziness, tingling and tremors.  Endo/Heme/Allergies: Negative for environmental allergies. Bruises/bleeds easily.  Psychiatric/Behavioral: Negative for depression.      Allergies  Allergen Reactions  . Sulfa Antibiotics Shortness Of Breath     Past Medical History:  Diagnosis Date  . Anxiety   . Depression   . Fatty liver   . GERD (gastroesophageal reflux disease)   . Hypothyroidism   . Obesity   . Thyroid disease      Past Surgical History:  Procedure Laterality Date  . APPENDECTOMY    .  ESOPHAGOGASTRODUODENOSCOPY (EGD) WITH PROPOFOL N/A 12/20/2014   Procedure: ESOPHAGOGASTRODUODENOSCOPY (EGD) WITH PROPOFOL;  Surgeon: Manya Silvas, MD;  Location: [facility]  ENDOSCOPY;  Service: Endoscopy;  Laterality: N/A;  . SAVORY DILATION N/A 12/20/2014   Procedure: SAVORY DILATION;  Surgeon: Manya Silvas, MD;  Location: [facility] ENDOSCOPY;  Service: Endoscopy;  Laterality: N/A;  . TONSILLECTOMY      Social History   Socioeconomic History  . Marital status: Widowed    Spouse name: Not on file  . Number of children: 3  . Years of education: Not on file  . Highest education level: Not on file  Occupational History  . Not on file  Social Needs  . Financial resource strain: Not on file  . Food insecurity:    Worry: Not on file    Inability: Not on file  . Transportation needs:    Medical: Not on file    Non-medical: Not on file  Tobacco Use  . Smoking status: Never Smoker  . Smokeless tobacco: Never Used  Substance and Sexual Activity  . Alcohol use: No  . Drug use: No  . Sexual activity: Not Currently    Birth control/protection: Post-menopausal  Lifestyle  . Physical activity:    Days per week: Not on file    Minutes per session: Not on file  . Stress: Not on file  Relationships  . Social connections:    Talks on phone: Not on file    Gets together: Not on file    Attends religious service: Not on file    Active member of club or organization: Not on file    Attends meetings of clubs or organizations: Not on file    Relationship status: Not on file  . Intimate partner violence:    Fear of current or ex partner: Not on file    Emotionally abused: Not on file    Physically abused: Not on file    Forced sexual activity: Not on file  Other Topics Concern  . Not on file  Social History Narrative  . Not on file    Family History  Problem Relation Age of Onset  . Diabetes Mother   . COPD Mother   . Kidney disease Mother   . Depression Mother   . Hypertension Mother   . Heart attack Father   . Aneurysm Father   . Anxiety disorder Sister   . Depression Sister   . Diabetes Sister   . Hypertension Sister   . Atrial fibrillation  Brother   . Spinal muscular atrophy Brother   . Breast cancer Sister   . Bone cancer Sister   . Depression Sister   . Anxiety disorder Sister   . Liver cancer Sister   . Breast cancer Sister   . Colon cancer Sister   . Depression Sister   . Diabetes Sister   . COPD Sister   . Depression Sister   . Anxiety disorder Sister   . Skin cancer Sister   . Diabetes Brother   . Depression Brother   . Skin cancer Maternal Aunt   . Diabetes Maternal Aunt   . Cervical cancer Paternal Aunt      Current Outpatient Medications:  .  acidophilus (RISAQUAD) CAPS capsule, Take 1 capsule by mouth 2 (two) times daily., Disp: , Rfl:  .  busPIRone (BUSPAR) 15 MG tablet, Take 1 tablet (15 mg total) by mouth at bedtime., Disp: 90 tablet, Rfl: 1 .  clonazePAM (KLONOPIN) 0.5 MG tablet, Take  1 tablet (0.5 mg total) by mouth daily as needed for anxiety. Only for severe anxiety sx, Disp: 20 tablet, Rfl: 1 .  escitalopram (LEXAPRO) 10 MG tablet, Take 1 tablet (10 mg total) by mouth daily., Disp: 90 tablet, Rfl: 1 .  levothyroxine (SYNTHROID, LEVOTHROID) 100 MCG tablet, Take 1 tablet (100 mcg total) by mouth daily before breakfast., Disp: 30 tablet, Rfl: 0 .  loperamide (IMODIUM) 2 MG capsule, Take 2 mg by mouth as needed for diarrhea or loose stools., Disp: , Rfl:  .  megestrol (MEGACE) 20 MG tablet, Take 1 tablet (20 mg total) by mouth 2 (two) times daily., Disp: 60 tablet, Rfl: 0 .  Melatonin 1 MG TABS, Take 5 mg by mouth at bedtime as needed., Disp: , Rfl:  .  omeprazole (PRILOSEC) 40 MG capsule, Take 1 capsule (40 mg total) by mouth daily., Disp: 30 capsule, Rfl: 1 .  potassium chloride (K-DUR) 10 MEQ tablet, Take 10 mEq by mouth daily., Disp: , Rfl:  .  QUEtiapine (SEROQUEL) 100 MG tablet, Take 1 tablet (100 mg total) by mouth at bedtime., Disp: 90 tablet, Rfl: 1 .  traZODone (DESYREL) 150 MG tablet, Take 1 tablet (150 mg total) by mouth at bedtime., Disp: 90 tablet, Rfl: 1 .  traZODone (DESYREL) 50 MG  tablet, Take 1 tablet (50 mg total) by mouth at bedtime as needed for sleep. To be combined with 150 mg as needed, Disp: 90 tablet, Rfl: 1 .  XIFAXAN 550 MG TABS tablet, Take 1 tablet by mouth 2 (two) times daily., Disp: , Rfl:  .  fluticasone (FLONASE) 50 MCG/ACT nasal spray, Place 2 sprays into both nostrils daily. (Patient not taking: Reported on 01/14/2018), Disp: 16 g, Rfl: 2 .  ondansetron (ZOFRAN ODT) 4 MG disintegrating tablet, Take 1 tablet (4 mg total) by mouth every 8 (eight) hours as needed for nausea or vomiting. (Patient not taking: Reported on 01/14/2018), Disp: 25 tablet, Rfl: 0  Physical exam:  Vitals:   01/14/18 1347  BP: 136/76  Pulse: 98  Resp: 18  Temp: 98.9 F (37.2 C)  TempSrc: Tympanic  Weight: 129 lb 11.2 oz (58.8 kg)   Physical Exam  Constitutional: She is oriented to person, place, and time. No distress.  Frail appearance elderly female sitting in wheelchair.  HENT:  Head: Normocephalic and atraumatic.  Mouth/Throat: Oropharynx is clear and moist.  Eyes: Pupils are equal, round, and reactive to light. EOM are normal. No scleral icterus.  Neck: Normal range of motion. Neck supple.  Cardiovascular: Normal rate, regular rhythm and normal heart sounds.  Irregular heartbeats  Pulmonary/Chest: Effort normal. No respiratory distress. She has no wheezes.  Decreased breath sound bilaterally, right > left  Abdominal: Soft. Bowel sounds are normal. She exhibits no distension and no mass. There is no tenderness.  Musculoskeletal: Normal range of motion. She exhibits no edema or deformity.  Neurological: She is alert and oriented to person, place, and time. No cranial nerve deficit. Coordination normal.  Skin: Skin is warm and dry. No rash noted. No erythema.  Psychiatric: She has a normal mood and affect. Her behavior is normal. Thought content normal.    CMP Latest Ref Rng & Units 01/14/2018  Glucose 70 - 99 mg/dL 191(H)  BUN 8 - 23 mg/dL 11  Creatinine 0.44 - 1.00  mg/dL 0.68  Sodium 135 - 145 mmol/L 137  Potassium 3.5 - 5.1 mmol/L 3.8  Chloride 98 - 111 mmol/L 112(H)  CO2 22 - 32 mmol/L 21(L)  Calcium 8.9 - 10.3 mg/dL 8.5(L)  Total Protein 6.5 - 8.1 g/dL 6.5  Total Bilirubin 0.3 - 1.2 mg/dL 1.2  Alkaline Phos 38 - 126 U/L 84  AST 15 - 41 U/L 36  ALT 0 - 44 U/L 16   CBC Latest Ref Rng & Units 01/14/2018  WBC 3.6 - 11.0 K/uL 3.1(L)  Hemoglobin 12.0 - 16.0 g/dL 12.6  Hematocrit 35.0 - 47.0 % 37.8  Platelets 150 - 440 K/uL 77(L)   RADIOGRAPHIC STUDIES: I have personally reviewed the radiological images as listed and agreed with the findings in the report. No results found.    Assessment and plan Patient is a 77 y.o. female with history of depression, chronic thrombocytopenia present for follow-up of leukopenia and thrombocytopenia. 1. Thrombocytopenia (HCC)   2. Other fatigue   3. Poor appetite   4. Weight loss   5. Decreased breath sounds at right lung base   labs reviewed and discussed with patient.  Perisistent lymphocytopenia and thrombocytopenia.  Discussed about bone marrow biopsy, patient declined.   Weight loss, fatigue Continue megace  # Hypothyroidism TSH normalized. Continue synthroid.   # Splenomegaly, ultrasound of abdomen was independently reviewed by me and discussed with patient.  Patient has splenomegaly, measures 14.5 x 7.5 x 13.1.  This can contribute to her thrombocytopenia.  Portal vein is patent, mildly distended with a diameter of 1.3 cm.  Questionable portal hypertension.  Patient denies any history of chronic liver disease, hepatitis negative.  Refer to GI  Etiology of splenomegaly is not clear. Plan obtain CT chest abdomen pelvis for additional work.  We spent sufficient time to discuss many aspect of care, questions were answered to patient's satisfaction. Orders Placed This Encounter  Procedures  . CT Chest W Contrast    Standing Status:   Future    Standing Expiration Date:   01/14/2019    Order  Specific Question:   ** REASON FOR EXAM (FREE TEXT)    Answer:   weight loss, lack of appetite, splenomegaly, thrombocytopenia.    Order Specific Question:   If indicated for the ordered procedure, I authorize the administration of contrast media per Radiology protocol    Answer:   Yes    Order Specific Question:   Preferred imaging location?    Answer:   [facility]    Order Specific Question:   Radiology Contrast Protocol - do NOT remove file path    Answer:   \\[email or web address]  . CT Abdomen Pelvis W Contrast    Standing Status:   Future    Standing Expiration Date:   01/14/2019    Order Specific Question:   ** REASON FOR EXAM (FREE TEXT)    Answer:   weight loss, splenomegaly,    Order Specific Question:   If indicated for the ordered procedure, I authorize the administration of contrast media per Radiology protocol    Answer:   Yes    Order Specific Question:   Preferred imaging location?    Answer:   [facility]    Order Specific Question:   Is Oral Contrast requested for this exam?    Answer:   Yes, Per Radiology protocol    Order Specific Question:   Radiology Contrast Protocol - do NOT remove file path    Answer:   \\[email or web address]  . CBC with Differential/Platelet    Standing Status:   Future    Standing Expiration Date:   01/15/2019  . Copper, serum    Standing Status:  Future    Standing Expiration Date:   01/15/2019  . Comprehensive metabolic panel    Standing Status:   Future    Standing Expiration Date:   01/15/2019   Follow up in 3 months.   Total face to face encounter time for this patient visit was  25 min. >50% of the time was  spent in counseling and coordination of care. Earlie Server, MD, PhD Hematology Oncology [facility] Pager- [phone] 01/16/2018

## 2018-01-22 ENCOUNTER — Ambulatory Visit: Payer: Medicare Other | Admitting: Psychiatry

## 2018-01-27 ENCOUNTER — Ambulatory Visit: Payer: Self-pay

## 2018-02-01 ENCOUNTER — Other Ambulatory Visit: Payer: Self-pay

## 2018-02-01 ENCOUNTER — Emergency Department: Payer: Medicare Other

## 2018-02-01 ENCOUNTER — Encounter: Payer: Self-pay | Admitting: *Deleted

## 2018-02-01 ENCOUNTER — Inpatient Hospital Stay
Admission: EM | Admit: 2018-02-01 | Discharge: 2018-02-05 | DRG: 987 | Disposition: A | Discharge status: Home / Self Care | Payer: Medicare Other | Attending: Internal Medicine | Admitting: Internal Medicine

## 2018-02-01 DIAGNOSIS — E039 Hypothyroidism, unspecified: Secondary | ICD-10-CM | POA: Diagnosis present

## 2018-02-01 DIAGNOSIS — F329 Major depressive disorder, single episode, unspecified: Secondary | ICD-10-CM | POA: Diagnosis present

## 2018-02-01 DIAGNOSIS — D649 Anemia, unspecified: Secondary | ICD-10-CM | POA: Diagnosis present

## 2018-02-01 DIAGNOSIS — Z79899 Other long term (current) drug therapy: Secondary | ICD-10-CM

## 2018-02-01 DIAGNOSIS — Z882 Allergy status to sulfonamides status: Secondary | ICD-10-CM

## 2018-02-01 DIAGNOSIS — D696 Thrombocytopenia, unspecified: Secondary | ICD-10-CM | POA: Diagnosis not present

## 2018-02-01 DIAGNOSIS — N950 Postmenopausal bleeding: Secondary | ICD-10-CM | POA: Diagnosis present

## 2018-02-01 DIAGNOSIS — D61818 Other pancytopenia: Secondary | ICD-10-CM | POA: Diagnosis present

## 2018-02-01 DIAGNOSIS — N840 Polyp of corpus uteri: Secondary | ICD-10-CM | POA: Diagnosis present

## 2018-02-01 DIAGNOSIS — F419 Anxiety disorder, unspecified: Secondary | ICD-10-CM | POA: Diagnosis present

## 2018-02-01 DIAGNOSIS — N939 Abnormal uterine and vaginal bleeding, unspecified: Secondary | ICD-10-CM | POA: Diagnosis present

## 2018-02-01 DIAGNOSIS — K7581 Nonalcoholic steatohepatitis (NASH): Secondary | ICD-10-CM | POA: Diagnosis present

## 2018-02-01 DIAGNOSIS — Z818 Family history of other mental and behavioral disorders: Secondary | ICD-10-CM

## 2018-02-01 DIAGNOSIS — Z6823 Body mass index (BMI) 23.0-23.9, adult: Secondary | ICD-10-CM

## 2018-02-01 DIAGNOSIS — K7210 Chronic hepatic failure without coma: Secondary | ICD-10-CM | POA: Diagnosis present

## 2018-02-01 DIAGNOSIS — M1990 Unspecified osteoarthritis, unspecified site: Secondary | ICD-10-CM | POA: Diagnosis present

## 2018-02-01 DIAGNOSIS — K219 Gastro-esophageal reflux disease without esophagitis: Secondary | ICD-10-CM | POA: Diagnosis present

## 2018-02-01 DIAGNOSIS — Z79890 Hormone replacement therapy (postmenopausal): Secondary | ICD-10-CM

## 2018-02-01 DIAGNOSIS — E43 Unspecified severe protein-calorie malnutrition: Secondary | ICD-10-CM | POA: Diagnosis present

## 2018-02-01 DIAGNOSIS — R161 Splenomegaly, not elsewhere classified: Secondary | ICD-10-CM | POA: Diagnosis present

## 2018-02-01 DIAGNOSIS — R9389 Abnormal findings on diagnostic imaging of other specified body structures: Secondary | ICD-10-CM | POA: Diagnosis present

## 2018-02-01 LAB — COMPREHENSIVE METABOLIC PANEL
ALK PHOS: 70 U/L (ref 38–126)
ALT: 16 U/L (ref 0–44)
AST: 35 U/L (ref 15–41)
Albumin: 3.3 g/dL — ABNORMAL LOW (ref 3.5–5.0)
Anion gap: 9 (ref 5–15)
BUN: 10 mg/dL (ref 8–23)
CALCIUM: 8.2 mg/dL — AB (ref 8.9–10.3)
CO2: 22 mmol/L (ref 22–32)
CREATININE: 0.69 mg/dL (ref 0.44–1.00)
Chloride: 107 mmol/L (ref 98–111)
Glucose, Bld: 152 mg/dL — ABNORMAL HIGH (ref 70–99)
Potassium: 3.8 mmol/L (ref 3.5–5.1)
Sodium: 138 mmol/L (ref 135–145)
Total Bilirubin: 1.4 mg/dL — ABNORMAL HIGH (ref 0.3–1.2)
Total Protein: 6.2 g/dL — ABNORMAL LOW (ref 6.5–8.1)

## 2018-02-01 LAB — CBC
HCT: 39.3 % (ref 36.0–46.0)
Hemoglobin: 12.8 g/dL (ref 12.0–15.0)
MCH: 29.8 pg (ref 26.0–34.0)
MCHC: 32.6 g/dL (ref 30.0–36.0)
MCV: 91.6 fL (ref 80.0–100.0)
NRBC: 0 % (ref 0.0–0.2)
PLATELETS: 67 10*3/uL — AB (ref 150–400)
RBC: 4.29 MIL/uL (ref 3.87–5.11)
RDW: 17.5 % — AB (ref 11.5–15.5)
WBC: 3 10*3/uL — AB (ref 4.0–10.5)

## 2018-02-01 LAB — TYPE AND SCREEN
ABO/RH(D): O POS
ANTIBODY SCREEN: NEGATIVE

## 2018-02-01 LAB — URINALYSIS, COMPLETE (UACMP) WITH MICROSCOPIC
BILIRUBIN URINE: NEGATIVE
Bacteria, UA: NONE SEEN
GLUCOSE, UA: NEGATIVE mg/dL
Ketones, ur: NEGATIVE mg/dL
Leukocytes, UA: NEGATIVE
Nitrite: NEGATIVE
PH: 6 (ref 5.0–8.0)
Protein, ur: NEGATIVE mg/dL
Specific Gravity, Urine: 1.014 (ref 1.005–1.030)

## 2018-02-01 LAB — CBC WITH DIFFERENTIAL/PLATELET
Abs Immature Granulocytes: 0 10*3/uL (ref 0.00–0.07)
BASOS ABS: 0 10*3/uL (ref 0.0–0.1)
BASOS PCT: 0 %
EOS ABS: 0 10*3/uL (ref 0.0–0.5)
EOS PCT: 2 %
HCT: 31.5 % — ABNORMAL LOW (ref 36.0–46.0)
Hemoglobin: 10.2 g/dL — ABNORMAL LOW (ref 12.0–15.0)
Immature Granulocytes: 0 %
LYMPHS PCT: 23 %
Lymphs Abs: 0.5 10*3/uL — ABNORMAL LOW (ref 0.7–4.0)
MCH: 30.4 pg (ref 26.0–34.0)
MCHC: 32.4 g/dL (ref 30.0–36.0)
MCV: 94 fL (ref 80.0–100.0)
Monocytes Absolute: 0.2 10*3/uL (ref 0.1–1.0)
Monocytes Relative: 9 %
NRBC: 0 % (ref 0.0–0.2)
Neutro Abs: 1.6 10*3/uL — ABNORMAL LOW (ref 1.7–7.7)
Neutrophils Relative %: 66 %
PLATELETS: 45 10*3/uL — AB (ref 150–400)
RBC: 3.35 MIL/uL — AB (ref 3.87–5.11)
RDW: 17.4 % — AB (ref 11.5–15.5)
WBC: 2.4 10*3/uL — AB (ref 4.0–10.5)

## 2018-02-01 MED ORDER — QUETIAPINE FUMARATE 25 MG PO TABS
100.0000 mg | ORAL_TABLET | Freq: Every day | ORAL | Status: DC
  Administered 2018-02-01 – 2018-02-04 (×4): 100 mg via ORAL
  Filled 2018-02-01 (×4): qty 4

## 2018-02-01 MED ORDER — ESCITALOPRAM OXALATE 10 MG PO TABS
10.0000 mg | ORAL_TABLET | Freq: Every day | ORAL | Status: DC
  Administered 2018-02-02 – 2018-02-05 (×3): 10 mg via ORAL
  Filled 2018-02-01 (×4): qty 1

## 2018-02-01 MED ORDER — SODIUM CHLORIDE 0.9% FLUSH: 3.0000 mL | INTRAVENOUS | Status: DC | PRN

## 2018-02-01 MED ORDER — ALBUTEROL SULFATE (2.5 MG/3ML) 0.083% IN NEBU: 2.5000 mg | INHALATION_SOLUTION | RESPIRATORY_TRACT | Status: DC | PRN

## 2018-02-01 MED ORDER — MELATONIN 1 MG PO TABS
5.0000 mg | ORAL_TABLET | Freq: Every evening | ORAL | Status: DC | PRN
  Filled 2018-02-01: qty 5

## 2018-02-01 MED ORDER — HYDROCODONE-ACETAMINOPHEN 5-325 MG PO TABS: 1.0000 | ORAL_TABLET | ORAL | Status: DC | PRN

## 2018-02-01 MED ORDER — PANTOPRAZOLE SODIUM 40 MG PO TBEC
40.0000 mg | DELAYED_RELEASE_TABLET | Freq: Every day | ORAL | Status: DC
  Administered 2018-02-02 – 2018-02-05 (×3): 40 mg via ORAL
  Filled 2018-02-01 (×3): qty 1

## 2018-02-01 MED ORDER — LOPERAMIDE HCL 2 MG PO CAPS: 2.0000 mg | ORAL_CAPSULE | ORAL | Status: DC | PRN

## 2018-02-01 MED ORDER — RISAQUAD PO CAPS
1.0000 | ORAL_CAPSULE | Freq: Two times a day (BID) | ORAL | Status: DC
  Administered 2018-02-01 – 2018-02-05 (×7): 1 via ORAL
  Filled 2018-02-01 (×7): qty 1

## 2018-02-01 MED ORDER — SENNOSIDES-DOCUSATE SODIUM 8.6-50 MG PO TABS: 1.0000 | ORAL_TABLET | Freq: Every evening | ORAL | Status: DC | PRN

## 2018-02-01 MED ORDER — BISACODYL 5 MG PO TBEC: 5.0000 mg | DELAYED_RELEASE_TABLET | Freq: Every day | ORAL | Status: DC | PRN

## 2018-02-01 MED ORDER — LEVOTHYROXINE SODIUM 100 MCG PO TABS
100.0000 ug | ORAL_TABLET | Freq: Every day | ORAL | Status: DC
  Administered 2018-02-02 – 2018-02-05 (×3): 100 ug via ORAL
  Filled 2018-02-01 (×3): qty 1

## 2018-02-01 MED ORDER — BUSPIRONE HCL 15 MG PO TABS
15.0000 mg | ORAL_TABLET | Freq: Every day | ORAL | Status: DC
  Administered 2018-02-02 – 2018-02-04 (×3): 15 mg via ORAL
  Filled 2018-02-01 (×4): qty 1

## 2018-02-01 MED ORDER — MEGESTROL ACETATE 20 MG PO TABS
20.0000 mg | ORAL_TABLET | Freq: Two times a day (BID) | ORAL | Status: DC
  Administered 2018-02-02 – 2018-02-03 (×4): 20 mg via ORAL
  Filled 2018-02-01 (×7): qty 1

## 2018-02-01 MED ORDER — ACETAMINOPHEN 650 MG RE SUPP: 650.0000 mg | Freq: Four times a day (QID) | RECTAL | Status: DC | PRN

## 2018-02-01 MED ORDER — POTASSIUM CHLORIDE CRYS ER 10 MEQ PO TBCR
10.0000 meq | EXTENDED_RELEASE_TABLET | Freq: Every day | ORAL | Status: DC
  Administered 2018-02-02 – 2018-02-05 (×3): 10 meq via ORAL
  Filled 2018-02-01 (×3): qty 1

## 2018-02-01 MED ORDER — SODIUM CHLORIDE 0.9% FLUSH
3.0000 mL | Freq: Two times a day (BID) | INTRAVENOUS | Status: DC
  Administered 2018-02-01 – 2018-02-05 (×7): 3 mL via INTRAVENOUS

## 2018-02-01 MED ORDER — SODIUM CHLORIDE 0.9 % IV SOLN
250.0000 mL | INTRAVENOUS | Status: DC | PRN
  Administered 2018-02-04: 12:00:00 via INTRAVENOUS

## 2018-02-01 MED ORDER — RIFAXIMIN 550 MG PO TABS
550.0000 mg | ORAL_TABLET | Freq: Two times a day (BID) | ORAL | Status: DC
  Administered 2018-02-01 – 2018-02-05 (×7): 550 mg via ORAL
  Filled 2018-02-01 (×7): qty 1

## 2018-02-01 MED ORDER — ACETAMINOPHEN 325 MG PO TABS: 650.0000 mg | ORAL_TABLET | Freq: Four times a day (QID) | ORAL | Status: DC | PRN

## 2018-02-01 MED ORDER — ONDANSETRON HCL 4 MG PO TABS: 4.0000 mg | ORAL_TABLET | Freq: Four times a day (QID) | ORAL | Status: DC | PRN

## 2018-02-01 MED ORDER — TRAZODONE HCL 50 MG PO TABS
150.0000 mg | ORAL_TABLET | Freq: Every day | ORAL | Status: DC
  Administered 2018-02-01 – 2018-02-04 (×4): 150 mg via ORAL
  Filled 2018-02-01 (×4): qty 3

## 2018-02-01 MED ORDER — TRAZODONE HCL 50 MG PO TABS: 50.0000 mg | ORAL_TABLET | Freq: Every evening | ORAL | Status: DC | PRN

## 2018-02-01 MED ORDER — CLONAZEPAM 0.5 MG PO TABS: 0.5000 mg | ORAL_TABLET | Freq: Every day | ORAL | Status: DC | PRN

## 2018-02-01 MED ORDER — ONDANSETRON HCL 4 MG/2ML IJ SOLN: 4.0000 mg | Freq: Four times a day (QID) | INTRAMUSCULAR | Status: DC | PRN

## 2018-02-01 NOTE — Progress Notes (Signed)
Advanced Care Plan.  Purpose of Encounter: CODE STATUS. Parties in Attendance: The patient, her grandson and me. Patient's Decisional Capacity: Yes. Medical Story: Cassidy [name]  is a 77 y.o. female with a known history of anxiety, depression, fatty liver, GERD, hypothyroidism.   Patient is being admitted for anemia, leukopenia, thrombocytopenia and vaginal bleeding.  I discussed with patient about her current condition, prognosis and CODE STATUS.  The patient want to be resuscitated and intubated to get her back. Plan:  Code Status: Full code. Time spent discussing advance care planning: 18 minutes.

## 2018-02-01 NOTE — ED Provider Notes (Signed)
[facility] Emergency Department Provider Note ____________________________________________   First MD Initiated Contact with Patient 02/01/18 1514     (approximate)  I have reviewed the triage vital signs and the nursing notes.   HISTORY  Chief Complaint Bleeding/Bruising    HPI Cassidy [name] is a 77 y.o. female with PMH as noted below including a recent diagnosis of thrombocytopenia who presents with bleeding from her pelvic or rectal area over the last 3 days, occurring between bowel movements, described as bright red blood with some clots.  The patient denies any associated lightheadedness or weakness (she states she is always somewhat weak but there has been no change) and denies any pain.  No prior history of this.  Past Medical History:  Diagnosis Date  . Anxiety   . Depression   . Fatty liver   . GERD (gastroesophageal reflux disease)   . Hypothyroidism   . Obesity   . Thyroid disease     Patient Active Problem List   Diagnosis Date Noted  . Anemia 02/01/2018  . Leukopenia 12/01/2017  . Dysphagia 11/21/2017  . Emesis, persistent 11/20/2017  . Severe recurrent major depression without psychotic features ([place]) 06/19/2016  . Hepatic encephalopathy ([place]) 08/13/2015  . Thrombocytopenia ([place]) 07/23/2015  . Amnesia 03/20/2015  . Major depression in remission ([place]) 03/12/2015  . Gonalgia 01/05/2015  . Absolute anemia 11/27/2014  . Arthritis 11/27/2014  . Acid reflux 11/27/2014  . Depression, major, recurrent, in partial remission ([place]) 11/27/2014  . Encounter for general adult medical examination without abnormal findings 08/25/2014  . Chronic LBP 08/15/2014  . Complete rotator cuff rupture of left shoulder 05/01/2014  . Infraspinatus tenosynovitis 05/01/2014  . Other synovitis and tenosynovitis, right shoulder 05/01/2014  . Difficulty in walking 11/30/2013  . Extreme obesity 11/30/2013  . Morbid obesity ([place]) 11/30/2013  . Arthritis,  degenerative 10/01/2013  . Steatohepatitis 10/01/2013  . Orthostasis 10/01/2013  . 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 Fall 09/26/2013  . Dizziness 09/07/2013  . Osteoporosis with fracture 09/06/2013  . Clinical depression 03/25/2012  . Adult hypothyroidism 03/25/2012  . Esophageal stenosis 07/03/2009  . Back ache 03/08/2003  . Anxiety state 12/27/2002    Past Surgical History:  Procedure Laterality Date  . APPENDECTOMY    . ESOPHAGOGASTRODUODENOSCOPY (EGD) WITH PROPOFOL N/A 12/20/2014   Procedure: ESOPHAGOGASTRODUODENOSCOPY (EGD) WITH PROPOFOL;  Surgeon: Manya Silvas, MD;  Location: [facility] ENDOSCOPY;  Service: Endoscopy;  Laterality: N/A;  . SAVORY DILATION N/A 12/20/2014   Procedure: SAVORY DILATION;  Surgeon: Manya Silvas, MD;  Location: [facility] ENDOSCOPY;  Service: Endoscopy;  Laterality: N/A;  . TONSILLECTOMY      Prior to Admission medications   Medication Sig Start Date End Date Taking? Authorizing Provider  acidophilus (RISAQUAD) CAPS capsule Take 1 capsule by mouth 2 (two) times daily.   Yes [provider]  busPIRone (BUSPAR) 15 MG tablet Take 1 tablet (15 mg total) by mouth at bedtime. 01/05/18  Yes Ursula Alert, MD  clonazePAM (KLONOPIN) 0.5 MG tablet Take 1 tablet (0.5 mg total) by mouth daily as needed for anxiety. Only for severe anxiety sx 01/05/18  Yes Eappen, Ria Clock, MD  escitalopram (LEXAPRO) 10 MG tablet Take 1 tablet (10 mg total) by mouth daily. 01/05/18  Yes Ursula Alert, MD  levothyroxine (SYNTHROID, LEVOTHROID) 100 MCG tablet Take 1 tablet (100 mcg total) by mouth daily before breakfast. 11/25/17  Yes Mayo, Pete Pelt, MD  loperamide (IMODIUM) 2 MG capsule Take 2 mg by mouth as needed  for diarrhea or loose stools.   Yes [provider]  megestrol (MEGACE) 20 MG tablet Take 1 tablet (20 mg total) by mouth 2 (two) times daily. 12/18/17  Yes Earlie Server, MD  Melatonin 1 MG TABS Take 5 mg by mouth at bedtime as needed.   Yes [provider]   omeprazole (PRILOSEC) 40 MG capsule Take 1 capsule (40 mg total) by mouth daily. 06/19/17 06/19/18 Yes Earleen Newport, MD  potassium chloride (K-DUR) 10 MEQ tablet Take 10 mEq by mouth daily.   Yes [provider]  QUEtiapine (SEROQUEL) 100 MG tablet Take 1 tablet (100 mg total) by mouth at bedtime. 01/05/18  Yes Ursula Alert, MD  traZODone (DESYREL) 150 MG tablet Take 1 tablet (150 mg total) by mouth at bedtime. 01/05/18  Yes Ursula Alert, MD  traZODone (DESYREL) 50 MG tablet Take 1 tablet (50 mg total) by mouth at bedtime as needed for sleep. To be combined with 150 mg as needed 01/05/18  Yes Eappen, Saramma, MD  XIFAXAN 550 MG TABS tablet Take 1 tablet by mouth 2 (two) times daily. 10/21/17  Yes [provider]  fluticasone (FLONASE) 50 MCG/ACT nasal spray Place 2 sprays into both nostrils daily. Patient not taking: Reported on 01/14/2018 07/04/16   Clapacs, Madie Reno, MD  ondansetron (ZOFRAN ODT) 4 MG disintegrating tablet Take 1 tablet (4 mg total) by mouth every 8 (eight) hours as needed for nausea or vomiting. Patient not taking: Reported on 01/14/2018 11/24/17   MayoPete Pelt, MD    Allergies Sulfa antibiotics  Family History  Problem Relation Age of Onset  . Diabetes Mother   . COPD Mother   . Kidney disease Mother   . Depression Mother   . Hypertension Mother   . Heart attack Father   . Aneurysm Father   . Anxiety disorder Sister   . Depression Sister   . Diabetes Sister   . Hypertension Sister   . Atrial fibrillation Brother   . Spinal muscular atrophy Brother   . Breast cancer Sister   . Bone cancer Sister   . Depression Sister   . Anxiety disorder Sister   . Liver cancer Sister   . Breast cancer Sister   . Colon cancer Sister   . Depression Sister   . Diabetes Sister   . COPD Sister   . Depression Sister   . Anxiety disorder Sister   . Skin cancer Sister   . Diabetes Brother   . Depression Brother   . Skin cancer Maternal Aunt   . Diabetes  Maternal Aunt   . Cervical cancer Paternal Aunt     Social History Social History   Tobacco Use  . Smoking status: Never Smoker  . Smokeless tobacco: Never Used  Substance Use Topics  . Alcohol use: No  . Drug use: No    Review of Systems  Constitutional: No fever.   Eyes: No redness. ENT: No sore throat. Cardiovascular: Denies chest pain. Respiratory: Denies shortness of breath. Gastrointestinal: No vomiting.  Genitourinary: Negative for dysuria.  Musculoskeletal: Negative for back pain. Skin: Negative for rash. Neurological: Negative for headache.   ____________________________________________   PHYSICAL EXAM:  VITAL SIGNS: ED Triage Vitals  Enc Vitals Group     BP 02/01/18 1354 (!) 156/64     Pulse Rate 02/01/18 1354 88     Resp 02/01/18 1354 16     Temp 02/01/18 1354 98.7 F (37.1 C)     Temp Source 02/01/18  1354 Oral     SpO2 02/01/18 1354 95 %     Weight 02/01/18 1355 129 lb (58.5 kg)     Height --      Head Circumference --      Peak Flow --      Pain Score 02/01/18 1355 0     Pain Loc --      Pain Edu? --      Excl. in [place]? --     Constitutional: Alert and oriented.  Frail but comfortable appearing.   Eyes: Conjunctivae are normal.  No pallor. Head: Atraumatic. Nose: No congestion/rhinnorhea. Mouth/Throat: Mucous membranes are slightly dry.   Neck: Normal range of motion.  Cardiovascular: Good peripheral circulation. Respiratory: Normal respiratory effort. Gastrointestinal: Soft and nontender. No distention.  Brown stool, guaiac negative on DRE.  No external hemorrhoids or visible bleeding. Genitourinary: No flank tenderness.  Trace amount of blood residue around labia.  No active bleeding. Musculoskeletal: No lower extremity edema.  Extremities warm and well perfused.  Neurologic:  Normal speech and language. No gross focal neurologic deficits are appreciated.  Skin:  Skin is warm and dry. No rash noted. Psychiatric: Calm and  cooperative.  ____________________________________________   LABS (all labs ordered are listed, but only abnormal results are displayed)  Labs Reviewed  COMPREHENSIVE METABOLIC PANEL - Abnormal; Notable for the following components:      Result Value   Glucose, Bld 152 (*)    Calcium 8.2 (*)    Total Protein 6.2 (*)    Albumin 3.3 (*)    Total Bilirubin 1.4 (*)    All other components within normal limits  CBC - Abnormal; Notable for the following components:   WBC 3.0 (*)    RDW 17.5 (*)    Platelets 67 (*)    All other components within normal limits  URINALYSIS, COMPLETE (UACMP) WITH MICROSCOPIC - Abnormal; Notable for the following components:   Color, Urine AMBER (*)    APPearance CLEAR (*)    Hgb urine dipstick LARGE (*)    RBC / HPF >50 (*)    All other components within normal limits  CBC WITH DIFFERENTIAL/PLATELET - Abnormal; Notable for the following components:   WBC 2.4 (*)    RBC 3.35 (*)    Hemoglobin 10.2 (*)    HCT 31.5 (*)    RDW 17.4 (*)    Platelets 45 (*)    Neutro Abs 1.6 (*)    Lymphs Abs 0.5 (*)    All other components within normal limits  CBC  POC OCCULT BLOOD, ED  TYPE AND SCREEN   ____________________________________________  EKG   ____________________________________________  RADIOLOGY  US pelvis: Heterogeneous endometrial thickening 1.6 cm  ____________________________________________   PROCEDURES  Procedure(s) performed: No  Procedures  Critical Care performed: No ____________________________________________   INITIAL IMPRESSION / ASSESSMENT AND PLAN / ED COURSE  Pertinent labs & imaging results that were available during my care of the patient were reviewed by me and considered in my medical decision making (see chart for details).  77 year old female with PMH as noted above and with a recent diagnosis of thrombocytopenia presents with pelvic area bleeding which she is not sure is rectal or vaginal.  She denies  associated pain, or any new weakness or lightheadedness.  I reviewed the past medical records in epic; the patient was admitted last month for nausea and vomiting.  She was noted to have acute on chronic thrombocytopenia thought to be secondary to to nutritional  deficiency.  The patient subsequently has followed up with Dr. Tasia Catchings from hematology/oncology who is following her thrombocytopenia.  The patient has been offered but apparently has declined bone marrow biopsy.  On exam, the patient is frail but otherwise well-appearing.  Her vital signs are normal except for slight hypertension.  Her abdomen is soft and nontender.  There is no evidence of rectal bleeding and her stool is guaiac negative.  There is a trace amount of blood around the labia but no active bleeding.  Overall I suspect most likely either UTI/cystitis, other hematuria related to her trauma cytopenia, or gynecologic cause.  We will obtain labs, UA, pelvic ultrasound, and reassess.  ----------------------------------------- 9:00 PM on 02/01/2018 -----------------------------------------  Ultrasound revealed thickened heterogeneous endometrium which is quite possibly the source of the bleeding.  I consulted Dr. Leonides Schanz from OB/GYN who advised that if the patient was stable and was being discharged home that the patient could follow-up with her within the next week.  She advised that the patient likely would need an endometrial biopsy, and so stated that she would need to be cleared by hematology/oncology before doing this.  I also discussed the patient's case with Dr. Janese Banks from hematology oncology.  However, the repeat CBC after 4 hours showed a relatively significant drop in hemoglobin and platelets despite the patient not getting any fluids in the interim.  Although there was no active bleeding during my exam, given this relatively significant drop I felt that it would be safest to admit the patient for serial hemoglobins and possible  further consultation in the hospital.  The patient agreed with this plan, and her caregiver expressed strong preference for her to be admitted.  I signed the patient out to the hospitalist Dr. Bridgett Larsson. ____________________________________________   FINAL CLINICAL IMPRESSION(S) / ED DIAGNOSES  Final diagnoses:  Abnormal vaginal bleeding  Anemia, unspecified type  Thrombocytopenia ([place])      NEW MEDICATIONS STARTED DURING THIS VISIT:  Current Discharge Medication List       Note:  This document was prepared using Dragon voice recognition software and may include unintentional dictation errors.    Arta Silence, MD 02/01/18 2303

## 2018-02-01 NOTE — ED Notes (Signed)
Patient transported to room 131 by this EDT.

## 2018-02-01 NOTE — ED Triage Notes (Signed)
Pt is here due to bleeding which began which began Saturday.  Pt is unsure if it is rectal or vaginal.  Pt has depend on and I note some spotting in her depend. Family member states that the bleeding has been significant.  Pt denies any pain and appears in no distress.  No fainting or dizziness.  Pt is unable to give information about amount of bleeding.

## 2018-02-01 NOTE — H&P (Signed)
[facility] NAME: Cassidy [name]    MR#:  [id number]  DATE OF BIRTH:  1940-11-02  DATE OF ADMISSION:  02/01/2018  PRIMARY CARE PHYSICIAN: Kirk Ruths, MD   REQUESTING/REFERRING PHYSICIAN: Arta Silence, MD  CHIEF COMPLAINT:   Chief Complaint  Patient presents with  . Bleeding/Bruising   Pelvic or rectal area bleeding for 3 days. HISTORY OF PRESENT ILLNESS:  Cassidy [name]  is a 77 y.o. female with a known history of anxiety, depression, fatty liver, GERD, hypothyroidism.  The patient presents the ED with above chief complaints.  She was recently diagnosed thrombocytopenia.  She said that she has had pelvic or rectal area bleeding for the past 3 days.  She denies any headache, dizziness or generalized weakness.  Stool occult is negative.  She has no active bleeding but hemoglobin decreased from 12.8 to 10.2 in ED. ED physician request admission.  PAST MEDICAL HISTORY:   Past Medical History:  Diagnosis Date  . Anxiety   . Depression   . Fatty liver   . GERD (gastroesophageal reflux disease)   . Hypothyroidism   . Obesity   . Thyroid disease     PAST SURGICAL HISTORY:   Past Surgical History:  Procedure Laterality Date  . APPENDECTOMY    . ESOPHAGOGASTRODUODENOSCOPY (EGD) WITH PROPOFOL N/A 12/20/2014   Procedure: ESOPHAGOGASTRODUODENOSCOPY (EGD) WITH PROPOFOL;  Surgeon: Manya Silvas, MD;  Location: [facility] ENDOSCOPY;  Service: Endoscopy;  Laterality: N/A;  . SAVORY DILATION N/A 12/20/2014   Procedure: SAVORY DILATION;  Surgeon: Manya Silvas, MD;  Location: [facility] ENDOSCOPY;  Service: Endoscopy;  Laterality: N/A;  . TONSILLECTOMY      SOCIAL HISTORY:   Social History   Tobacco Use  . Smoking status: Never Smoker  . Smokeless tobacco: Never Used  Substance Use Topics  . Alcohol use: No    FAMILY HISTORY:   Family History  Problem Relation Age of Onset  . Diabetes Mother   . COPD Mother   .  Kidney disease Mother   . Depression Mother   . Hypertension Mother   . Heart attack Father   . Aneurysm Father   . Anxiety disorder Sister   . Depression Sister   . Diabetes Sister   . Hypertension Sister   . Atrial fibrillation Brother   . Spinal muscular atrophy Brother   . Breast cancer Sister   . Bone cancer Sister   . Depression Sister   . Anxiety disorder Sister   . Liver cancer Sister   . Breast cancer Sister   . Colon cancer Sister   . Depression Sister   . Diabetes Sister   . COPD Sister   . Depression Sister   . Anxiety disorder Sister   . Skin cancer Sister   . Diabetes Brother   . Depression Brother   . Skin cancer Maternal Aunt   . Diabetes Maternal Aunt   . Cervical cancer Paternal Aunt     DRUG ALLERGIES:   Allergies  Allergen Reactions  . Sulfa Antibiotics Shortness Of Breath    REVIEW OF SYSTEMS:   Review of Systems  Constitutional: Negative for chills, fever and malaise/fatigue.  HENT: Negative for sore throat.   Eyes: Negative for blurred vision and double vision.  Respiratory: Negative for cough, hemoptysis, shortness of breath, wheezing and stridor.   Cardiovascular: Negative for chest pain, palpitations, orthopnea and leg swelling.  Gastrointestinal: Negative for abdominal pain, blood in stool,  diarrhea, melena, nausea and vomiting.  Genitourinary: Negative for dysuria, flank pain and hematuria.       Pelvic and rectal area bleeding.  Musculoskeletal: Negative for back pain and joint pain.  Neurological: Negative for dizziness, sensory change, focal weakness, seizures, loss of consciousness, weakness and headaches.  Endo/Heme/Allergies: Negative for polydipsia.  Psychiatric/Behavioral: Negative for depression. The patient is not nervous/anxious.     MEDICATIONS AT HOME:   Prior to Admission medications   Medication Sig Start Date End Date Taking? Authorizing Provider  acidophilus (RISAQUAD) CAPS capsule Take 1 capsule by mouth 2 (two)  times daily.   Yes [provider]  busPIRone (BUSPAR) 15 MG tablet Take 1 tablet (15 mg total) by mouth at bedtime. 01/05/18  Yes Ursula Alert, MD  clonazePAM (KLONOPIN) 0.5 MG tablet Take 1 tablet (0.5 mg total) by mouth daily as needed for anxiety. Only for severe anxiety sx 01/05/18  Yes Eappen, Ria Clock, MD  escitalopram (LEXAPRO) 10 MG tablet Take 1 tablet (10 mg total) by mouth daily. 01/05/18  Yes Ursula Alert, MD  levothyroxine (SYNTHROID, LEVOTHROID) 100 MCG tablet Take 1 tablet (100 mcg total) by mouth daily before breakfast. 11/25/17  Yes Mayo, Pete Pelt, MD  loperamide (IMODIUM) 2 MG capsule Take 2 mg by mouth as needed for diarrhea or loose stools.   Yes [provider]  megestrol (MEGACE) 20 MG tablet Take 1 tablet (20 mg total) by mouth 2 (two) times daily. 12/18/17  Yes Earlie Server, MD  Melatonin 1 MG TABS Take 5 mg by mouth at bedtime as needed.   Yes [provider]  omeprazole (PRILOSEC) 40 MG capsule Take 1 capsule (40 mg total) by mouth daily. 06/19/17 06/19/18 Yes Earleen Newport, MD  potassium chloride (K-DUR) 10 MEQ tablet Take 10 mEq by mouth daily.   Yes [provider]  QUEtiapine (SEROQUEL) 100 MG tablet Take 1 tablet (100 mg total) by mouth at bedtime. 01/05/18  Yes Ursula Alert, MD  traZODone (DESYREL) 150 MG tablet Take 1 tablet (150 mg total) by mouth at bedtime. 01/05/18  Yes Ursula Alert, MD  traZODone (DESYREL) 50 MG tablet Take 1 tablet (50 mg total) by mouth at bedtime as needed for sleep. To be combined with 150 mg as needed 01/05/18  Yes Eappen, Saramma, MD  XIFAXAN 550 MG TABS tablet Take 1 tablet by mouth 2 (two) times daily. 10/21/17  Yes [provider]  fluticasone (FLONASE) 50 MCG/ACT nasal spray Place 2 sprays into both nostrils daily. Patient not taking: Reported on 01/14/2018 07/04/16   Clapacs, Madie Reno, MD  ondansetron (ZOFRAN ODT) 4 MG disintegrating tablet Take 1 tablet (4 mg total) by mouth every 8 (eight)  hours as needed for nausea or vomiting. Patient not taking: Reported on 01/14/2018 11/24/17   MayoPete Pelt, MD      VITAL SIGNS:  Blood pressure 131/63, pulse 68, temperature 98.7 F (37.1 C), temperature source Oral, resp. rate 16, weight 58.5 kg, SpO2 99 %.  PHYSICAL EXAMINATION:  Physical Exam  GENERAL:  77 y.o.-year-old patient lying in the bed with no acute distress.  EYES: Pupils equal, round, reactive to light and accommodation. No scleral icterus. Extraocular muscles intact.  HEENT: Head atraumatic, normocephalic. Oropharynx and nasopharynx clear.  NECK:  Supple, no jugular venous distention. No thyroid enlargement, no tenderness.  LUNGS: Normal breath sounds bilaterally, no wheezing, rales,rhonchi or crepitation. No use of accessory muscles of respiration.  CARDIOVASCULAR: S1, S2 normal. No murmurs, rubs, or gallops.  ABDOMEN: Soft, nontender, nondistended. Bowel sounds present. No organomegaly or mass.  EXTREMITIES: No pedal edema, cyanosis, or clubbing.  NEUROLOGIC: Cranial nerves II through XII are intact. Muscle strength 5/5 in all extremities. Sensation intact. Gait not checked.  PSYCHIATRIC: The patient is alert and oriented x 3.  SKIN: No obvious rash, lesion, or ulcer.   LABORATORY PANEL:   CBC Recent Labs  Lab 02/01/18 1812  WBC 2.4*  HGB 10.2*  HCT 31.5*  PLT 45*   ------------------------------------------------------------------------------------------------------------------  Chemistries  Recent Labs  Lab 02/01/18 1404  NA 138  K 3.8  CL 107  CO2 22  GLUCOSE 152*  BUN 10  CREATININE 0.69  CALCIUM 8.2*  AST 35  ALT 16  ALKPHOS 70  BILITOT 1.4*   ------------------------------------------------------------------------------------------------------------------  Cardiac Enzymes No results for input(s): TROPONINI in the last 168  hours. ------------------------------------------------------------------------------------------------------------------  RADIOLOGY:  US Pelvic Complete With Transvaginal  Result Date: 02/01/2018 CLINICAL DATA:  Vaginal bleeding for 3 days EXAM: TRANSABDOMINAL AND TRANSVAGINAL ULTRASOUND OF PELVIS TECHNIQUE: Both transabdominal and transvaginal ultrasound examinations of the pelvis were performed. Transabdominal technique was performed for global imaging of the pelvis including uterus, ovaries, adnexal regions, and pelvic cul-de-sac. It was necessary to proceed with endovaginal exam following the transabdominal exam to visualize the uterus, endometrium, and ovaries. Transvaginal imaging was limited by patient discomfort. COMPARISON:  None FINDINGS: Uterus Measurements: 5.9 x 3.3 x 3.7 cm. Retroverted. Normal morphology without mass Endometrium Thickness: 16 mm. Abnormal appearing heterogeneous endometrial complex. No focal mass lesion delineated. Right ovary Not visualized on either transabdominal or endovaginal imaging, likely obscured by bowel Left ovary Not visualized on either transabdominal or endovaginal imaging, likely obscured by bowel Other findings No abnormal free fluid. IMPRESSION: Nonvisualization of ovaries. Abnormal appearing heterogeneous endometrial complex 16 mm thick; if bleeding remains unresponsive to hormonal or medical therapy, focal lesion work-up with sonohysterogram should be considered. Endometrial biopsy should also be considered in pre-menopausal patients at high risk for endometrial carcinoma. (Ref: Radiological Reasoning: Algorithmic Workup of Abnormal Vaginal Bleeding with Endovaginal Sonography and Sonohysterography. AJR 2008; 144:[id number]) Electronically Signed   By: Lavonia Dana M.D.   On: 02/01/2018 16:36      IMPRESSION AND PLAN:   Anemia due to acute blood loss secondary to vaginal bleeding The patient will be placed for observation. Follow-up hemoglobin, OB/GYN  consult.  Pancytopenia.  Follow-up CBC and hematology consult.  GERD, continue PPI.  All the records are reviewed and case discussed with ED provider. Management plans discussed with the patient, her grandson and they are in agreement.  CODE STATUS: Full code.  TOTAL TIME TAKING CARE OF THIS PATIENT: 35 minutes.    Demetrios Loll M.D on 02/01/2018 at 10:05 PM  Between 7am to 6pm - Pager - [phone]  After 6pm go to [email or web address] - [employer] [facility]  Office  [phone]  CC: Primary care physician; Kirk Ruths, MD   Note: This dictation was prepared with Dragon dictation along with smaller phrase technology. Any transcriptional errors that result from this process are unin

## 2018-02-02 LAB — CBC
HCT: 36.1 % (ref 36.0–46.0)
HEMOGLOBIN: 11.7 g/dL — AB (ref 12.0–15.0)
MCH: 29.8 pg (ref 26.0–34.0)
MCHC: 32.4 g/dL (ref 30.0–36.0)
MCV: 91.9 fL (ref 80.0–100.0)
Platelets: 51 10*3/uL — ABNORMAL LOW (ref 150–400)
RBC: 3.93 MIL/uL (ref 3.87–5.11)
RDW: 17.1 % — ABNORMAL HIGH (ref 11.5–15.5)
WBC: 2.1 10*3/uL — AB (ref 4.0–10.5)
nRBC: 0 % (ref 0.0–0.2)

## 2018-02-02 NOTE — Plan of Care (Signed)
  Problem: Spiritual Needs Goal: Ability to function at adequate level Outcome: Progressing   Problem: Education: Goal: Knowledge of General Education information will improve Description Including pain rating scale, medication(s)/side effects and non-pharmacologic comfort measures Outcome: Progressing   Problem: Clinical Measurements: Goal: Will remain free from infection Outcome: Progressing Goal: Diagnostic test results will improve Outcome: Progressing   Problem: Activity: Goal: Risk for activity intolerance will decrease Outcome: Progressing   Problem: Pain Managment: Goal: General experience of comfort will improve Outcome: Progressing   Problem: Safety: Goal: Ability to remain free from injury will improve Outcome: Progressing   Problem: Skin Integrity: Goal: Risk for impaired skin integrity will decrease Outcome: Progressing   Problem: Problem: Skin/Wound Progression Goal: Adequate nutrition will be maintained Outcome: Progressing

## 2018-02-02 NOTE — Progress Notes (Signed)
[place] at [place] NAME: Cassidy [name]    MR#:  [id number]  DATE OF BIRTH:  1941-02-04  SUBJECTIVE:admitted for vaginal bleeding, started to have vaginal bleeding since Saturday without abdominal pain.  Patient has history of thrombocytopenia  CHIEF COMPLAINT:   Chief Complaint  Patient presents with  . Bleeding/Bruising  Has vaginal bleeding.  Pelvic ultrasound is unremarkable.  REVIEW OF SYSTEMS:   ROS CONSTITUTIONAL: No fever, fatigue or weakness.  EYES: No blurred or double vision.  EARS, NOSE, AND THROAT: No tinnitus or ear pain.  RESPIRATORY: No cough, shortness of breath, wheezing or hemoptysis.  CARDIOVASCULAR: No chest pain, orthopnea, edema.  GASTROINTESTINAL: No nausea, vomiting, diarrhea or abdominal pain.  GENITOURINARY: No dysuria, hematuria.  ENDOCRINE: No polyuria, nocturia,  HEMATOLOGY patient has vaginal bleeding. SKIN: No rash or lesion. MUSCULOSKELETAL: No joint pain or arthritis.   NEUROLOGIC: No tingling, numbness, weakness.  PSYCHIATRY: No anxiety or depression.   DRUG ALLERGIES:   Allergies  Allergen Reactions  . Sulfa Antibiotics Shortness Of Breath    VITALS:  Blood pressure (!) 107/56, pulse 78, temperature 99.2 F (37.3 C), temperature source Oral, resp. rate 16, height _0  (1.549 m), weight 56.9 kg, SpO2 92 %.  PHYSICAL EXAMINATION:  GENERAL:  77 y.o.-year-old patient lying in the bed with no acute distress.  EYES: Pupils equal, round, reactive to light and accommodation. No scleral icterus. Extraocular muscles intact.  HEENT: Head atraumatic, normocephalic. Oropharynx and nasopharynx clear.  NECK:  Supple, no jugular venous distention. No thyroid enlargement, no tenderness.  LUNGS: Normal breath sounds bilaterally, no wheezing, rales,rhonchi or crepitation. No use of accessory muscles of respiration.  CARDIOVASCULAR: S1, S2 normal. No murmurs, rubs, or gallops.  ABDOMEN: Soft, nontender,  nondistended. Bowel sounds present. No organomegaly or mass.  EXTREMITIES: No pedal edema, cyanosis, or clubbing.  NEUROLOGIC: Cranial nerves II through XII are intact. Muscle strength 5/5 in all extremities. Sensation intact. Gait not checked.  PSYCHIATRIC: The patient is alert and oriented x 3.  SKIN: No obvious rash, lesion, or ulcer.    LABORATORY PANEL:   CBC Recent Labs  Lab 02/02/18 0618  WBC 2.1*  HGB 11.7*  HCT 36.1  PLT 51*   ------------------------------------------------------------------------------------------------------------------  Chemistries  Recent Labs  Lab 02/01/18 1404  NA 138  K 3.8  CL 107  CO2 22  GLUCOSE 152*  BUN 10  CREATININE 0.69  CALCIUM 8.2*  AST 35  ALT 16  ALKPHOS 70  BILITOT 1.4*   ------------------------------------------------------------------------------------------------------------------  Cardiac Enzymes No results for input(s): TROPONINI in the last 168 hours. ------------------------------------------------------------------------------------------------------------------  RADIOLOGY:  US Pelvic Complete With Transvaginal  Result Date: 02/01/2018 CLINICAL DATA:  Vaginal bleeding for 3 days EXAM: TRANSABDOMINAL AND TRANSVAGINAL ULTRASOUND OF PELVIS TECHNIQUE: Both transabdominal and transvaginal ultrasound examinations of the pelvis were performed. Transabdominal technique was performed for global imaging of the pelvis including uterus, ovaries, adnexal regions, and pelvic cul-de-sac. It was necessary to proceed with endovaginal exam following the transabdominal exam to visualize the uterus, endometrium, and ovaries. Transvaginal imaging was limited by patient discomfort. COMPARISON:  None FINDINGS: Uterus Measurements: 5.9 x 3.3 x 3.7 cm. Retroverted. Normal morphology without mass Endometrium Thickness: 16 mm. Abnormal appearing heterogeneous endometrial complex. No focal mass lesion delineated. Right ovary Not visualized on  either transabdominal or endovaginal imaging, likely obscured by bowel Left ovary Not visualized on either transabdominal or endovaginal imaging, likely obscured by bowel Other findings No abnormal free fluid. IMPRESSION:  Nonvisualization of ovaries. Abnormal appearing heterogeneous endometrial complex 16 mm thick; if bleeding remains unresponsive to hormonal or medical therapy, focal lesion work-up with sonohysterogram should be considered. Endometrial biopsy should also be considered in pre-menopausal patients at high risk for endometrial carcinoma. (Ref: Radiological Reasoning: Algorithmic Workup of Abnormal Vaginal Bleeding with Endovaginal Sonography and Sonohysterography. AJR 2008; 778:E42-35) Electronically Signed   By: Lavonia Dana M.D.   On: 02/01/2018 16:36    EKG:   Orders placed or performed during the hospital encounter of 11/20/17  . ED EKG  . ED EKG  . EKG 12-Lead  . EKG 12-Lead  . EKG    ASSESSMENT AND PLAN:  Chronic thrombocytopenia, followed by Dr. Tasia Catchings, had extensive blood work including normal B12, folate, negative hepatitis panel, HIV, ANA, peripheral smear.  Patient has absolute lymphopenia and neutropenia, has history of splenomegaly, which can contribute to thrombocytopenia as per Dr. Collie Siad note, no platelet transfusion yet, platelets are stable around 50.  Oncology consult appreciated because of low platelets and vaginal bleeding this time.  Patient declined bone marrow biopsy as per Dr. Collie Siad note. 2.  Vaginal bleeding likely due to thrombocytopenia but pelvic ultrasound is normal, GYN consult requested to evaluate for postmenopausal bleeding, possible need for biopsy. Has chronic thrombocytopenia dated back to 2015.  3/.medical problems include hypothyroidism, GERD, major depression, osteoarthritis, steatohepatitis, anxiety, Continue Klonopin, BuSpar patient   is on rifaximin for possible chronic hepatic encephalopathy  More than 50% time spent in counseling, coordination  of care, discussed the results with patient and also reviewed medical records from Dr. Collie Siad note.  all the records are reviewed and case discussed with Care Management/Social Workerr. Management plans discussed with the patient, family and they are in agreement.  CODE STATUS: Full code  TOTAL TIME TAKING CARE OF THIS PATIENT: 40 minutes.   POSSIBLE D/C IN 1-2 DAYS, DEPENDING ON CLINICAL CONDITION.   Epifanio Lesches M.D on 02/02/2018 at 1:23 PM  Between 7am to 6pm - Pager - [phone]  After 6pm go to [email or web address] - password [email or web address]  [facility]  Office  [phone]  CC: Primary care physician; Kirk Ruths, MD   Note: This dictation was prepared with Dragon dictation along with smaller phrase technology. Any transcriptional errors that result from this process are unintentional.

## 2018-02-02 NOTE — Consult Note (Addendum)
I have not yet seen this patient as I was unaware of a consultation placed.  I had been notified by the ED physician evaluating her upon arrival that she was being seen in ED and she was to be evaluated as an outpatient, to follow up with me.  Per his exam, the vagina did contain blood consistent with postmenopausal uterine bleeding.  She has a thickened endometrium for menopausal woman, 4x normal thickness.  She is likely bleeding due to the thrombocytopenia, however the possibility of malignancy is present with the combination of thrombocytopenia and thickened endometrium.  It must be ruled out with a sample of tissue.  Since the thickening is present, my recommendation will likely be a D&C to remove the lining in full - both diagnostic and therapeutic.  With her platelets at 50, I will not bring her to the OR without a transfusion and adequate response.  I will not do a biopsy for same reason.    She is has thrombocytopenia and neutropenia.  The vaginal bleeding is likely not a contributor for her condition, and merely a symptom of a much greater and morbid underlying condition.  Please do not delay workup of a bone-marrow or other malignancy that could be leading to this underproduction of essential-function end products.  Oncology is apparently on board.  Hopefully they were contacted.     Now that she is in-patient, I will see her tomorrow.   Continue Megace BID  ----- Larey Days, MD Attending Obstetrician and Gynecologist [facility], Department of [facility]

## 2018-02-02 NOTE — Progress Notes (Signed)
   02/02/18 1015  Clinical Encounter Type  Visited With Patient and family together  Visit Type Initial;Spiritual support  Referral From Nurse  Consult/Referral To Chaplain  Spiritual Encounters  Spiritual Needs Prayer;Emotional;Other (Comment)   [place] received an OR to visit with Cassidy [name]. When entering the patient's room I encountered the patient and her sister watching television. CassidyCopland was relaxing in her bed and did not appear to be in any pain. I practiced active listening as the patient and her sister discussed their upbringing and current health. I was asked to pray and I did then was paged away from the room. I will follow up as needed.

## 2018-02-02 NOTE — Care Management Obs Status (Signed)
[place] NOTIFICATION   Patient Details  Name: Cassidy [name] MRN: [id number] Date of Birth: 1941/04/08   Medicare Observation Status Notification Given:  Yes    Shelbie Ammons, RN 02/02/2018, 11:35 AM

## 2018-02-03 DIAGNOSIS — R161 Splenomegaly, not elsewhere classified: Secondary | ICD-10-CM | POA: Diagnosis present

## 2018-02-03 DIAGNOSIS — Z882 Allergy status to sulfonamides status: Secondary | ICD-10-CM | POA: Diagnosis not present

## 2018-02-03 DIAGNOSIS — R9389 Abnormal findings on diagnostic imaging of other specified body structures: Secondary | ICD-10-CM | POA: Diagnosis present

## 2018-02-03 DIAGNOSIS — E039 Hypothyroidism, unspecified: Secondary | ICD-10-CM

## 2018-02-03 DIAGNOSIS — K7581 Nonalcoholic steatohepatitis (NASH): Secondary | ICD-10-CM | POA: Diagnosis present

## 2018-02-03 DIAGNOSIS — D72819 Decreased white blood cell count, unspecified: Secondary | ICD-10-CM | POA: Diagnosis not present

## 2018-02-03 DIAGNOSIS — Z6823 Body mass index (BMI) 23.0-23.9, adult: Secondary | ICD-10-CM | POA: Diagnosis not present

## 2018-02-03 DIAGNOSIS — K760 Fatty (change of) liver, not elsewhere classified: Secondary | ICD-10-CM

## 2018-02-03 DIAGNOSIS — E669 Obesity, unspecified: Secondary | ICD-10-CM

## 2018-02-03 DIAGNOSIS — M1990 Unspecified osteoarthritis, unspecified site: Secondary | ICD-10-CM | POA: Diagnosis present

## 2018-02-03 DIAGNOSIS — D649 Anemia, unspecified: Secondary | ICD-10-CM | POA: Diagnosis present

## 2018-02-03 DIAGNOSIS — Z800 Family history of malignant neoplasm of digestive organs: Secondary | ICD-10-CM

## 2018-02-03 DIAGNOSIS — R5383 Other fatigue: Secondary | ICD-10-CM | POA: Diagnosis not present

## 2018-02-03 DIAGNOSIS — N939 Abnormal uterine and vaginal bleeding, unspecified: Secondary | ICD-10-CM

## 2018-02-03 DIAGNOSIS — F418 Other specified anxiety disorders: Secondary | ICD-10-CM

## 2018-02-03 DIAGNOSIS — D61818 Other pancytopenia: Secondary | ICD-10-CM | POA: Diagnosis present

## 2018-02-03 DIAGNOSIS — Z818 Family history of other mental and behavioral disorders: Secondary | ICD-10-CM | POA: Diagnosis not present

## 2018-02-03 DIAGNOSIS — N950 Postmenopausal bleeding: Secondary | ICD-10-CM | POA: Diagnosis present

## 2018-02-03 DIAGNOSIS — N840 Polyp of corpus uteri: Secondary | ICD-10-CM | POA: Diagnosis present

## 2018-02-03 DIAGNOSIS — Z79890 Hormone replacement therapy (postmenopausal): Secondary | ICD-10-CM | POA: Diagnosis not present

## 2018-02-03 DIAGNOSIS — D696 Thrombocytopenia, unspecified: Secondary | ICD-10-CM | POA: Diagnosis present

## 2018-02-03 DIAGNOSIS — Z79899 Other long term (current) drug therapy: Secondary | ICD-10-CM | POA: Diagnosis not present

## 2018-02-03 DIAGNOSIS — F329 Major depressive disorder, single episode, unspecified: Secondary | ICD-10-CM | POA: Diagnosis present

## 2018-02-03 DIAGNOSIS — F419 Anxiety disorder, unspecified: Secondary | ICD-10-CM | POA: Diagnosis present

## 2018-02-03 DIAGNOSIS — K219 Gastro-esophageal reflux disease without esophagitis: Secondary | ICD-10-CM | POA: Diagnosis present

## 2018-02-03 DIAGNOSIS — R531 Weakness: Secondary | ICD-10-CM

## 2018-02-03 DIAGNOSIS — Z803 Family history of malignant neoplasm of breast: Secondary | ICD-10-CM

## 2018-02-03 DIAGNOSIS — K7210 Chronic hepatic failure without coma: Secondary | ICD-10-CM | POA: Diagnosis present

## 2018-02-03 DIAGNOSIS — E43 Unspecified severe protein-calorie malnutrition: Secondary | ICD-10-CM | POA: Diagnosis present

## 2018-02-03 LAB — APTT: aPTT: 42 seconds — ABNORMAL HIGH (ref 24–36)

## 2018-02-03 LAB — HEMOGLOBIN AND HEMATOCRIT, BLOOD
HCT: 38.2 % (ref 36.0–46.0)
Hemoglobin: 12.4 g/dL (ref 12.0–15.0)

## 2018-02-03 LAB — CBC
HEMATOCRIT: 36.4 % (ref 36.0–46.0)
Hemoglobin: 12 g/dL (ref 12.0–15.0)
MCH: 30 pg (ref 26.0–34.0)
MCHC: 33 g/dL (ref 30.0–36.0)
MCV: 91 fL (ref 80.0–100.0)
NRBC: 0 % (ref 0.0–0.2)
PLATELETS: 57 10*3/uL — AB (ref 150–400)
RBC: 4 MIL/uL (ref 3.87–5.11)
RDW: 17.2 % — AB (ref 11.5–15.5)
WBC: 2.6 10*3/uL — AB (ref 4.0–10.5)

## 2018-02-03 LAB — PROTIME-INR
INR: 1.59
PROTHROMBIN TIME: 18.8 s — AB (ref 11.4–15.2)

## 2018-02-03 LAB — PATHOLOGIST SMEAR REVIEW

## 2018-02-03 MED ORDER — VITAMIN C 500 MG PO TABS
250.0000 mg | ORAL_TABLET | Freq: Two times a day (BID) | ORAL | Status: DC
  Administered 2018-02-03 – 2018-02-05 (×3): 250 mg via ORAL
  Filled 2018-02-03 (×3): qty 1

## 2018-02-03 MED ORDER — SODIUM CHLORIDE 0.9% IV SOLUTION: Freq: Once | INTRAVENOUS | Status: DC

## 2018-02-03 NOTE — Progress Notes (Signed)
[place] at [place] NAME: Cassidy [name]    MR#:  [id number]  DATE OF BIRTH:  1940/06/16  SUBJECTIVE:admitted for vaginal bleeding, started to have vaginal bleeding since Saturday without abdominal pain.  Patient has history of thrombocytopenia  CHIEF COMPLAINT:   Chief Complaint  Patient presents with  . Bleeding/Bruising  Has vaginal bleeding.  Pelvic ultrasound is unremarkable.  REVIEW OF SYSTEMS:   ROS CONSTITUTIONAL: No fever, fatigue or weakness.  EYES: No blurred or double vision.  EARS, NOSE, AND THROAT: No tinnitus or ear pain.  RESPIRATORY: No cough, shortness of breath, wheezing or hemoptysis.  CARDIOVASCULAR: No chest pain, orthopnea, edema.  GASTROINTESTINAL: No nausea, vomiting, diarrhea or abdominal pain.  GENITOURINARY: No dysuria, hematuria.  ENDOCRINE: No polyuria, nocturia,  HEMATOLOGY patient has vaginal bleeding. SKIN: No rash or lesion. MUSCULOSKELETAL: No joint pain or arthritis.   NEUROLOGIC: No tingling, numbness, weakness.  PSYCHIATRY: No anxiety or depression.   DRUG ALLERGIES:   Allergies  Allergen Reactions  . Sulfa Antibiotics Shortness Of Breath    VITALS:  Blood pressure (!) 115/45, pulse 78, temperature 98.8 F (37.1 C), temperature source Oral, resp. rate 16, height 5' 1"  (1.549 m), weight 56.9 kg, SpO2 91 %.  PHYSICAL EXAMINATION:  GENERAL:  77 y.o.-year-old patient lying in the bed with no acute distress.  EYES: Pupils equal, round, reactive to light and accommodation. No scleral icterus. Extraocular muscles intact.  HEENT: Head atraumatic, normocephalic. Oropharynx and nasopharynx clear.  NECK:  Supple, no jugular venous distention. No thyroid enlargement, no tenderness.  LUNGS: Normal breath sounds bilaterally, no wheezing, rales,rhonchi or crepitation. No use of accessory muscles of respiration.  CARDIOVASCULAR: S1, S2 normal. No murmurs, rubs, or gallops.  ABDOMEN: Soft, nontender,  nondistended. Bowel sounds present. No organomegaly or mass.  EXTREMITIES: No pedal edema, cyanosis, or clubbing.  NEUROLOGIC: Cranial nerves II through XII are intact. Muscle strength 5/5 in all extremities. Sensation intact. Gait not checked.  PSYCHIATRIC: The patient is alert and oriented x 3.  SKIN: No obvious rash, lesion, or ulcer.    LABORATORY PANEL:   CBC Recent Labs  Lab 02/03/18 0958 02/03/18 1148  WBC 2.6*  --   HGB 12.0 12.4  HCT 36.4 38.2  PLT 57*  --    ------------------------------------------------------------------------------------------------------------------  Chemistries  Recent Labs  Lab 02/01/18 1404  NA 138  K 3.8  CL 107  CO2 22  GLUCOSE 152*  BUN 10  CREATININE 0.69  CALCIUM 8.2*  AST 35  ALT 16  ALKPHOS 70  BILITOT 1.4*   ------------------------------------------------------------------------------------------------------------------  Cardiac Enzymes No results for input(s): TROPONINI in the last 168 hours. ------------------------------------------------------------------------------------------------------------------  RADIOLOGY:  US Pelvic Complete With Transvaginal  Result Date: 02/01/2018 CLINICAL DATA:  Vaginal bleeding for 3 days EXAM: TRANSABDOMINAL AND TRANSVAGINAL ULTRASOUND OF PELVIS TECHNIQUE: Both transabdominal and transvaginal ultrasound examinations of the pelvis were performed. Transabdominal technique was performed for global imaging of the pelvis including uterus, ovaries, adnexal regions, and pelvic cul-de-sac. It was necessary to proceed with endovaginal exam following the transabdominal exam to visualize the uterus, endometrium, and ovaries. Transvaginal imaging was limited by patient discomfort. COMPARISON:  None FINDINGS: Uterus Measurements: 5.9 x 3.3 x 3.7 cm. Retroverted. Normal morphology without mass Endometrium Thickness: 16 mm. Abnormal appearing heterogeneous endometrial complex. No focal mass lesion  delineated. Right ovary Not visualized on either transabdominal or endovaginal imaging, likely obscured by bowel Left ovary Not visualized on either transabdominal or endovaginal imaging, likely  obscured by bowel Other findings No abnormal free fluid. IMPRESSION: Nonvisualization of ovaries. Abnormal appearing heterogeneous endometrial complex 16 mm thick; if bleeding remains unresponsive to hormonal or medical therapy, focal lesion work-up with sonohysterogram should be considered. Endometrial biopsy should also be considered in pre-menopausal patients at high risk for endometrial carcinoma. (Ref: Radiological Reasoning: Algorithmic Workup of Abnormal Vaginal Bleeding with Endovaginal Sonography and Sonohysterography. AJR 2008; 956:L87-56) Electronically Signed   By: Lavonia Dana M.D.   On: 02/01/2018 16:36    EKG:   Orders placed or performed during the hospital encounter of 11/20/17  . ED EKG  . ED EKG  . EKG 12-Lead  . EKG 12-Lead  . EKG    ASSESSMENT AND PLAN:  Chronic thrombocytopenia, followed by Dr. Tasia Catchings, had extensive blood work including normal B12, folate, negative hepatitis panel, HIV, ANA, peripheral smear.  Patient has absolute lymphopenia and neutropenia, has history of splenomegaly, which can contribute to thrombocytopenia as per Dr. Collie Siad note, no platelet transfusion yet, platelets are stable around 50.  Oncology consult appreciated because of low platelets and vaginal bleeding this time.  Patient declined bone marrow biopsy as per Dr. Collie Siad note.,  Patient was followed with Dr. Mike Gip before but family wanted to change to different doctor and Dr. Tasia Catchings is following this patient. 2.  Vaginal bleeding likely due to thrombocytopenia.  Spoke with Dr. Vikki Ports but over the phone, patient has endometrial thickening, prefers to do [facility] for both diagnostic and therapeutic purposes but patient needs platelets.  Will give platelets just before D&C as per Dr. 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 Has chronic thrombocytopenia dated back to 2015.  Platelets are around 57 today  3/.medical problems include hypothyroidism, GERD, major depression, osteoarthritis, steatohepatitis, anxiety, Continue Klonopin, BuSpar patient   is on rifaximin for possible chronic hepatic encephalopathy  More than 50% time spent in counseling, coordination of care, discussed the results with patient and also reviewed medical records from Dr. Collie Siad note.  all the records are reviewed and case discussed with Care Management/Social Workerr. Management plans discussed with the patient, family and they are in agreement.  CODE STATUS: Full code  TOTAL TIME TAKING CARE OF THIS PATIENT: 40 minutes.   POSSIBLE D/C IN 1-2 DAYS, DEPENDING ON CLINICAL CONDITION.   Epifanio Lesches M.D on 02/03/2018 at 12:54 PM  Between 7am to 6pm - Pager - [phone]  After 6pm go to [email or web address] - password [email or web address] [facility]  [place]  Office  [phone]  CC: Primary care physician; Kirk Ruths, MD   Note: This dictation was prepared with Dragon dictation along with smaller phrase technology. Any transcriptional errors that result from this process are unintentional.

## 2018-02-03 NOTE — Progress Notes (Signed)
   02/03/18 1330  Clinical Encounter Type  Visited With Patient not available  Visit Type Follow-up (order for AD)  Referral From Nurse  Consult/Referral To Chaplain   Chaplain attempted to follow up on order for AD; physician with patient.  Chaplain to try back later.

## 2018-02-03 NOTE — Progress Notes (Signed)
Up with Dr. Leonides Schanz over the phone, also spoke with Dr. Tasia Catchings recommends 2 units of platelet transfusion before D&C.  If patient can have D&C today she needs emergency release of 2 units of platelets just before D&C

## 2018-02-03 NOTE — Progress Notes (Signed)
Initial Nutrition Assessment  DOCUMENTATION CODES:   Severe malnutrition in context of social or environmental circumstances  INTERVENTION:  Recommend liberalizing diet to regular.  Provide Magic cup TID with meals, each supplement provides 290 kcal and 9 grams of protein.  Provide vitamin C 250 mg PO BID.  Recommend copper 2 mg PO daily.  Consider increasing Megace dose for adequate appetite stimulation.  NUTRITION DIAGNOSIS:   Severe Malnutrition related to social / environmental circumstances as evidenced by severe fat depletion, severe muscle depletion.  GOAL:   Patient will meet greater than or equal to 90% of their needs  MONITOR:   PO intake, Supplement acceptance, Labs, Weight trends, I & O's  REASON FOR ASSESSMENT:   Malnutrition Screening Tool    ASSESSMENT:   77 year old female with PMHx of anxiety, depression, hypothyroidism, GERD, steatohepatitis, chronic thrombocytopenia who is admitted with vaginal bleeding.   -Per chart plan will be for D&C (for diagnostic and therapeutic purposes) after patient receives platelets.  Met with patient at bedside. She reports she has had a decreased appetite for years now. She lives with her sister and her family prepares meals for her. She is unsure what is causing her decreased appetite. She reports she does not like meat anymore so she does not have any meat or fish. She occasionally has yogurt or beans. She mainly eats vegetables, sweet potatoes, soup, and rice and gravy. She reports eating small amounts at meals, though. She does not like oral nutrition supplements such as Boost or Ensure and does not want to drink any. She is amenable to trying [employer].   Patient is unsure of her UBW or weight history. She believes she has lost weight because her clothes fit very loosely now. Noted in patient's PMHx is a diagnosis of obesity so she must have lost a significant amount of weight since that diagnosis was made. Per review of  weight history in chart patient was 96.2 kg on 10/18/2014 (BMI at that time would have been 40.1 kg/m2), 81.6 kg on 06/19/2016, 65.8 kg on 06/19/2017, and is currently 56.9 kg (125.4 lbs). From 06/2016 to 06/2017 patient lost 15.8 kg (19.4% body weight), which is not quite significant for time frame.  Medications reviewed and include: acidophilus 1 capsule BID, levothyroxine, Megace 20 mg BID, pantoprazole, potassium chloride 10 mEq daily, Seroquel 100 mg QHS. Per Dr. Collie Siad outpatient note the Megace is for poor appetite however the current dose is not adequate for appetite stimulation.  Labs reviewed. On 11/21/2017 vitamin B12 was WNL at 622. On 11/22/2017 copper was low at 59 and zinc was WNL at 75. Patient reports she has been on copper supplementation at home.  NUTRITION - FOCUSED PHYSICAL EXAM:    Most Recent Value  Orbital Region  Severe depletion  Upper Arm Region  Severe depletion  Thoracic and Lumbar Region  Severe depletion  Buccal Region  Severe depletion  Temple Region  Severe depletion  Clavicle Bone Region  Severe depletion  Clavicle and Acromion Bone Region  Severe depletion  Scapular Bone Region  Severe depletion  Dorsal Hand  Severe depletion  Patellar Region  Moderate depletion  Anterior Thigh Region  Moderate depletion  Posterior Calf Region  Severe depletion  Edema (RD Assessment)  None  Hair  Reviewed  Eyes  Reviewed  Mouth  Reviewed  Skin  Reviewed [ecchymosis, petechiae]  Nails  Reviewed     Diet Order:   Diet Order  Diet regular Room service appropriate? Yes; Fluid consistency: Thin  Diet effective now              EDUCATION NEEDS:   No education needs have been identified at this time  Skin:  Skin Assessment: Reviewed RN Assessment(ecchymosis, petechiae)  Last BM:  02/03/2018 - medium type 4  Height:   Ht Readings from Last 1 Encounters:  02/01/18 5' 1"  (1.549 m)    Weight:   Wt Readings from Last 1 Encounters:  02/01/18 56.9 kg     Ideal Body Weight:  47.7 kg  BMI:  Body mass index is 23.69 kg/m.  Estimated Nutritional Needs:   Kcal:  1425-1700 (25-30 kcal/kg)  Protein:  70-85 grams (1.2-1.5 grams/kg)  Fluid:  1.4 L/day (25 mL/kg)  Willey Blade, MS, RD, LDN Office: [phone] Pager: [phone] After Hours/Weekend Pager: [phone]

## 2018-02-03 NOTE — Progress Notes (Signed)
   02/03/18 1515  Clinical Encounter Type  Visited With Patient  Visit Type Follow-up  Spiritual Encounters  Spiritual Needs Emotional   Chaplain followed up with patient regarding AD.  Patient stated that her sister makes all her decisions.  Chaplain began explaining advanced directives; patient reported that she does what her sister says as she (patient) has a hard time remembering things.  Chaplain utilized active and reflective listening as patient reviewed important relationships in her life (sister, grandson) and the support that they offer, the love that she has for them.  Patient spoke of having a procedure scheduled for tomorrow (of which she could not name) and her faith/trust in God that all will be well.  Prayer and faith are important parts of her life.  Chaplain reviewed ongoing availability and encouraged patient to reach out as needed.

## 2018-02-03 NOTE — Consult Note (Signed)
Hematology/Oncology Consult note [facility] [phone] [phone]  Patient Care Team: Kirk Ruths, MD as PCP - General (Internal Medicine)   Name of the patient: Cassidy [name]  [id number]  Sep 29, 1940   Date of visit: 02/03/18 REASON FOR COSULTATION:  Pancytopenia History of presenting illness-  77 y.o. female with PMH listed at below who presents to ER for evaluation of vaginal bleeding.  Vaginal bleeding started about 3 4 days ago.  She has a history of chronic thrombocytopenia and leukopenia and follows up with me in the clinic.  She previously was seen by my colleague Dr. Mike Gip during her previous admission.  Patient switched provider as she was "not happy with the service".   we have previously discussed about bone marrow biopsy and patient declined.  Ultrasound showed splenomegaly with dilated portal vein suggesting portal hypertension.  I ordered a CT scan for further evaluation and patient was supposed to have CT done on 01/22/2018 and patient called and cancel her image appointment.  She was also referred to Dr. Vira Agar for further evaluation of portal hypertension and she has not seen gastroenterology yet.  Sister was at bedside.  She says "nobody from his office has called [place] for an appointment".  Her hemoglobin has been stable at her baseline. Pelvic ultrasound showed abnormal appearing heterogeneous endometrial complex 16 mm thick. Marland KitchenGYN has been consulted and recommend D&C.  Oncology was consulted for thrombocytopenia/leukopenia and vaginal bleeding.  Patient was lying on bed with no significant stress.  Sister was at bedside.  Review of Systems  Constitutional: Positive for malaise/fatigue. Negative for chills and fever.  HENT: Negative for sore throat.   Respiratory: Negative for cough and wheezing.   Cardiovascular: Negative for chest pain.  Gastrointestinal: Negative for abdominal pain and blood in stool.    Genitourinary: Negative for dysuria.       Vaginal bleeding  Skin: Negative for rash.  Neurological: Negative for dizziness.  Psychiatric/Behavioral: The patient is not nervous/anxious.     Allergies  Allergen Reactions  . Sulfa Antibiotics Shortness Of Breath    Patient Active Problem List   Diagnosis Date Noted  . Symptomatic anemia 02/03/2018  . Anemia 02/01/2018  . Leukopenia 12/01/2017  . Dysphagia 11/21/2017  . Emesis, persistent 11/20/2017  . Severe recurrent major depression without psychotic features ([place]) 06/19/2016  . Hepatic encephalopathy ([place]) 08/13/2015  . Thrombocytopenia ([place]) 07/23/2015  . Amnesia 03/20/2015  . Major depression in remission ([place]) 03/12/2015  . Gonalgia 01/05/2015  . Absolute anemia 11/27/2014  . Arthritis 11/27/2014  . Acid reflux 11/27/2014  . Depression, major, recurrent, in partial remission ([place]) 11/27/2014  . Encounter for general adult medical examination without abnormal findings 08/25/2014  . Chronic LBP 08/15/2014  . Complete rotator cuff rupture of left shoulder 05/01/2014  . Infraspinatus tenosynovitis 05/01/2014  . Other synovitis and tenosynovitis, right shoulder 05/01/2014  . Difficulty in walking 11/30/2013  . Extreme obesity 11/30/2013  . Morbid obesity ([place]) 11/30/2013  . Arthritis, degenerative 10/01/2013  . Steatohepatitis 10/01/2013  . Orthostasis 10/01/2013  . 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 Fall 09/26/2013  . Dizziness 09/07/2013  . Osteoporosis with fracture 09/06/2013  . Clinical depression 03/25/2012  . Adult hypothyroidism 03/25/2012  . Esophageal stenosis 07/03/2009  . Back ache 03/08/2003  . Anxiety state 12/27/2002     Past Medical History:  Diagnosis Date  . Anxiety   . Depression   . Fatty liver   . GERD (gastroesophageal reflux disease)   .  Hypothyroidism   . Obesity   . Thyroid disease      Past Surgical History:  Procedure Laterality Date  . APPENDECTOMY    .  ESOPHAGOGASTRODUODENOSCOPY (EGD) WITH PROPOFOL N/A 12/20/2014   Procedure: ESOPHAGOGASTRODUODENOSCOPY (EGD) WITH PROPOFOL;  Surgeon: Manya Silvas, MD;  Location: [facility] ENDOSCOPY;  Service: Endoscopy;  Laterality: N/A;  . SAVORY DILATION N/A 12/20/2014   Procedure: SAVORY DILATION;  Surgeon: Manya Silvas, MD;  Location: [facility] ENDOSCOPY;  Service: Endoscopy;  Laterality: N/A;  . TONSILLECTOMY      Social History   Socioeconomic History  . Marital status: Widowed    Spouse name: Not on file  . Number of children: 3  . Years of education: Not on file  . Highest education level: Not on file  Occupational History  . Not on file  Social Needs  . Financial resource strain: Not on file  . Food insecurity:    Worry: Not on file    Inability: Not on file  . Transportation needs:    Medical: Not on file    Non-medical: Not on file  Tobacco Use  . Smoking status: Never Smoker  . Smokeless tobacco: Never Used  Substance and Sexual Activity  . Alcohol use: No  . Drug use: No  . Sexual activity: Not Currently    Birth control/protection: Post-menopausal  Lifestyle  . Physical activity:    Days per week: Not on file    Minutes per session: Not on file  . Stress: Not on file  Relationships  . Social connections:    Talks on phone: Not on file    Gets together: Not on file    Attends religious service: Not on file    Active member of club or organization: Not on file    Attends meetings of clubs or organizations: Not on file    Relationship status: Not on file  . Intimate partner violence:    Fear of current or ex partner: Not on file    Emotionally abused: Not on file    Physically abused: Not on file    Forced sexual activity: Not on file  Other Topics Concern  . Not on file  Social History Narrative  . Not on file     Family History  Problem Relation Age of Onset  . Diabetes Mother   . COPD Mother   . Kidney disease Mother   . Depression Mother   . Hypertension  Mother   . Heart attack Father   . Aneurysm Father   . Anxiety disorder Sister   . Depression Sister   . Diabetes Sister   . Hypertension Sister   . Atrial fibrillation Brother   . Spinal muscular atrophy Brother   . Breast cancer Sister   . Bone cancer Sister   . Depression Sister   . Anxiety disorder Sister   . Liver cancer Sister   . Breast cancer Sister   . Colon cancer Sister   . Depression Sister   . Diabetes Sister   . COPD Sister   . Depression Sister   . Anxiety disorder Sister   . Skin cancer Sister   . Diabetes Brother   . Depression Brother   . Skin cancer Maternal Aunt   . Diabetes Maternal Aunt   . Cervical cancer Paternal Aunt      Current Facility-Administered Medications:  .  0.9 %  sodium chloride infusion (Manually program via Guardrails IV Fluids), , Intravenous, Once, [place], [place],  MD .  0.9 %  sodium chloride infusion, 250 mL, Intravenous, PRN, Demetrios Loll, MD .  acetaminophen (TYLENOL) tablet 650 mg, 650 mg, Oral, Q6H PRN **OR** acetaminophen (TYLENOL) suppository 650 mg, 650 mg, Rectal, Q6H PRN, Demetrios Loll, MD .  acidophilus (RISAQUAD) capsule 1 capsule, 1 capsule, Oral, BID, Demetrios Loll, MD, 1 capsule at 02/03/18 0931 .  albuterol (PROVENTIL) (2.5 MG/3ML) 0.083% nebulizer solution 2.5 mg, 2.5 mg, Nebulization, Q2H PRN, Demetrios Loll, MD .  bisacodyl (DULCOLAX) EC tablet 5 mg, 5 mg, Oral, Daily PRN, Demetrios Loll, MD .  busPIRone (BUSPAR) tablet 15 mg, 15 mg, Oral, Cherene Julian, MD, 15 mg at 02/02/18 2118 .  clonazePAM (KLONOPIN) tablet 0.5 mg, 0.5 mg, Oral, Daily PRN, Demetrios Loll, MD .  escitalopram (LEXAPRO) tablet 10 mg, 10 mg, Oral, Daily, Demetrios Loll, MD, 10 mg at 02/03/18 0933 .  HYDROcodone-acetaminophen (NORCO/VICODIN) 5-325 MG per tablet 1-2 tablet, 1-2 tablet, Oral, Q4H PRN, Demetrios Loll, MD .  levothyroxine (SYNTHROID, LEVOTHROID) tablet 100 mcg, 100 mcg, Oral, QAC breakfast, Demetrios Loll, MD, 100 mcg at 02/03/18 0932 .  loperamide (IMODIUM)  capsule 2 mg, 2 mg, Oral, PRN, Demetrios Loll, MD .  megestrol (MEGACE) tablet 20 mg, 20 mg, Oral, BID, Demetrios Loll, MD, 20 mg at 02/03/18 0932 .  ondansetron (ZOFRAN) tablet 4 mg, 4 mg, Oral, Q6H PRN **OR** ondansetron (ZOFRAN) injection 4 mg, 4 mg, Intravenous, Q6H PRN, Demetrios Loll, MD .  pantoprazole (PROTONIX) EC tablet 40 mg, 40 mg, Oral, Daily, Demetrios Loll, MD, 40 mg at 02/03/18 0931 .  potassium chloride (K-DUR,KLOR-CON) CR tablet 10 mEq, 10 mEq, Oral, Daily, Demetrios Loll, MD, 10 mEq at 02/03/18 0932 .  QUEtiapine (SEROQUEL) tablet 100 mg, 100 mg, Oral, QHS, Demetrios Loll, MD, 100 mg at 02/02/18 2116 .  rifaximin (XIFAXAN) tablet 550 mg, 550 mg, Oral, BID, Demetrios Loll, MD, 550 mg at 02/03/18 0932 .  senna-docusate (Senokot-S) tablet 1 tablet, 1 tablet, Oral, QHS PRN, Demetrios Loll, MD .  sodium chloride flush (NS) 0.9 % injection 3 mL, 3 mL, Intravenous, Q12H, Demetrios Loll, MD, 3 mL at 02/03/18 0933 .  sodium chloride flush (NS) 0.9 % injection 3 mL, 3 mL, Intravenous, PRN, Demetrios Loll, MD .  traZODone (DESYREL) tablet 150 mg, 150 mg, Oral, Cherene Julian, MD, 150 mg at 02/02/18 2116   Physical exam: ECOG  Vitals:   02/02/18 1943 02/03/18 0419 02/03/18 0813 02/03/18 1515  BP: (!) 112/46 (!) 122/42 (!) 115/45 (!) 125/54  Pulse: (!) 52 84 78 86  Resp: 16 16  (!) 24  Temp: 98.3 F (36.8 C) 98.2 F (36.8 C) 98.8 F (37.1 C) 98.3 F (36.8 C)  TempSrc: Oral Oral Oral Oral  SpO2: 93% 90% 91% 95%  Weight:      Height:       Physical Exam  Constitutional: She appears distressed.  HENT:  Head: Normocephalic and atraumatic.  Eyes: Pupils are equal, round, and reactive to light. EOM are normal.  Neck: Normal range of motion. Neck supple.  Cardiovascular: Normal rate and regular rhythm.  Pulmonary/Chest: Effort normal. No respiratory distress.  Abdominal: Soft. Bowel sounds are normal.  Musculoskeletal: Normal range of motion.  Neurological: She is alert.  Skin: Skin is warm and dry. She is not  diaphoretic.  Psychiatric: Affect normal.        CMP Latest Ref Rng & Units 02/01/2018  Glucose 70 - 99 mg/dL 152(H)  BUN 8 - 23 mg/dL 10  Creatinine  0.44 - 1.00 mg/dL 0.69  Sodium 135 - 145 mmol/L 138  Potassium 3.5 - 5.1 mmol/L 3.8  Chloride 98 - 111 mmol/L 107  CO2 22 - 32 mmol/L 22  Calcium 8.9 - 10.3 mg/dL 8.2(L)  Total Protein 6.5 - 8.1 g/dL 6.2(L)  Total Bilirubin 0.3 - 1.2 mg/dL 1.4(H)  Alkaline Phos 38 - 126 U/L 70  AST 15 - 41 U/L 35  ALT 0 - 44 U/L 16   CBC Latest Ref Rng & Units 02/03/2018  WBC 4.0 - 10.5 K/uL -  Hemoglobin 12.0 - 15.0 g/dL 12.4  Hematocrit 36.0 - 46.0 % 38.2  Platelets 150 - 400 K/uL -   RADIOGRAPHIC STUDIES: I have personally reviewed the radiological images as listed and agreed with the findings in the report. US Pelvic Complete With Transvaginal  Result Date: 02/01/2018 CLINICAL DATA:  Vaginal bleeding for 3 days EXAM: TRANSABDOMINAL AND TRANSVAGINAL ULTRASOUND OF PELVIS TECHNIQUE: Both transabdominal and transvaginal ultrasound examinations of the pelvis were performed. Transabdominal technique was performed for global imaging of the pelvis including uterus, ovaries, adnexal regions, and pelvic cul-de-sac. It was necessary to proceed with endovaginal exam following the transabdominal exam to visualize the uterus, endometrium, and ovaries. Transvaginal imaging was limited by patient discomfort. COMPARISON:  None FINDINGS: Uterus Measurements: 5.9 x 3.3 x 3.7 cm. Retroverted. Normal morphology without mass Endometrium Thickness: 16 mm. Abnormal appearing heterogeneous endometrial complex. No focal mass lesion delineated. Right ovary Not visualized on either transabdominal or endovaginal imaging, likely obscured by bowel Left ovary Not visualized on either transabdominal or endovaginal imaging, likely obscured by bowel Other findings No abnormal free fluid. IMPRESSION: Nonvisualization of ovaries. Abnormal appearing heterogeneous endometrial complex 16  mm thick; if bleeding remains unresponsive to hormonal or medical therapy, focal lesion work-up with sonohysterogram should be considered. Endometrial biopsy should also be considered in pre-menopausal patients at high risk for endometrial carcinoma. (Ref: Radiological Reasoning: Algorithmic Workup of Abnormal Vaginal Bleeding with Endovaginal Sonography and Sonohysterography. AJR 2008; [id number]) Electronically Signed   By: Lavonia Dana M.D.   On: 02/01/2018 16:36    Assessment and plan- Patient is a 77 y.o. female with history of chronic thrombocytopenia, leukopenia, splenomegaly, declines bone marrow biopsy for work-up presented to emergency room for vaginal bleeding for 3 days.  #Thrombocytopenia, she has previously underwent extensive noninvasive testing including normal B12 and folate level, negative hepatitis panel, HIV was done in March 2019 was negative.  Negative ANA.  Normal LDH.  Smear review showed unremarkable WBC morphology.  Thrombocytopenia with unremarkable platelet morphology.  Her copper level was reduced at 59 and has been advised to start copper supplements Bone marrow biopsy was discussed with patient during outpatient clinical visits.  Patient declined. Ultrasound also showed splenomegaly, dilated portal vein consistent with portal vein hypertension.  She is supposed to make an appointment to see Dr. Vira Agar for further evaluation.  Patient sister reports that no one from Dr. Tiffany Kocher office has contacted her so far. Today I discussed with patient again about their decision of bone marrow biopsy given that thrombocytopenia and lymphopenia.  Patient tells me that she does not want any bone marrow biopsy at this point.  Sister at bedside.  #Vaginal bleeding, endometrial thickened.  GYN on board.  Recommend D&C biopsy.  Discussed with GYN Dr. Leonides Schanz.  She prefers platelets above 100 K to reduce bleeding risk.  Due to patient's splenomegaly, it may be difficult for patient to reach that  threshold, but usually still considered acceptable as  long as platelet count is above 50k. Given her advanced age, fraiglty, high bleeding risk after D&C, I think platelet transfusion is reasonable.   I recommend that if patient gets platelet transfusion, it is best to give 1 hour prior to invasive procedure to maximize her platelet counts and reduce bleeding risks.  Due to her splenomegaly, transfuse platelets likely will be requested by spleen if transfused to early.  Plan discussed with Dr. Leonides Schanz and Dr.Konidena    Thank you for allowing me to participate in the care of this patient.  Total face to face encounter time for this patient visit was 70 min. >50% of the time was  spent in counseling and coordination of care.    Earlie Server, MD, PhD Hematology Oncology [facility] Pager- [phone] 02/03/2018

## 2018-02-04 ENCOUNTER — Inpatient Hospital Stay: Payer: Medicare Other | Admitting: Certified Registered Nurse Anesthetist

## 2018-02-04 ENCOUNTER — Ambulatory Visit: Payer: Medicare Other | Admitting: Psychiatry

## 2018-02-04 ENCOUNTER — Encounter
Admission: EM | Disposition: A | Discharge status: Home / Self Care | Payer: Self-pay | Source: Home / Self Care | Attending: Internal Medicine

## 2018-02-04 DIAGNOSIS — N939 Abnormal uterine and vaginal bleeding, unspecified: Secondary | ICD-10-CM

## 2018-02-04 DIAGNOSIS — Z808 Family history of malignant neoplasm of other organs or systems: Secondary | ICD-10-CM

## 2018-02-04 DIAGNOSIS — D72819 Decreased white blood cell count, unspecified: Secondary | ICD-10-CM

## 2018-02-04 DIAGNOSIS — D696 Thrombocytopenia, unspecified: Principal | ICD-10-CM

## 2018-02-04 DIAGNOSIS — D649 Anemia, unspecified: Secondary | ICD-10-CM

## 2018-02-04 DIAGNOSIS — K219 Gastro-esophageal reflux disease without esophagitis: Secondary | ICD-10-CM

## 2018-02-04 DIAGNOSIS — E43 Unspecified severe protein-calorie malnutrition: Secondary | ICD-10-CM

## 2018-02-04 HISTORY — PX: DILATATION & CURETTAGE/HYSTEROSCOPY WITH MYOSURE: SHX6511

## 2018-02-04 LAB — CBC
HCT: 36.9 % (ref 36.0–46.0)
HEMOGLOBIN: 11.9 g/dL — AB (ref 12.0–15.0)
MCH: 29.5 pg (ref 26.0–34.0)
MCHC: 32.2 g/dL (ref 30.0–36.0)
MCV: 91.3 fL (ref 80.0–100.0)
Platelets: 67 10*3/uL — ABNORMAL LOW (ref 150–400)
RBC: 4.04 MIL/uL (ref 3.87–5.11)
RDW: 17.2 % — ABNORMAL HIGH (ref 11.5–15.5)
WBC: 2.6 10*3/uL — ABNORMAL LOW (ref 4.0–10.5)
nRBC: 0 % (ref 0.0–0.2)

## 2018-02-04 SURGERY — DILATATION & CURETTAGE/HYSTEROSCOPY WITH MYOSURE
Anesthesia: General | Site: Vagina

## 2018-02-04 MED ORDER — COPPER GLUCONATE 2 MG PO TABS
1.0000 | ORAL_TABLET | Freq: Every day | ORAL | Status: DC
  Administered 2018-02-04 – 2018-02-05 (×2): 2 mg via ORAL
  Filled 2018-02-04 (×2): qty 1

## 2018-02-04 MED ORDER — MEPERIDINE HCL 50 MG/ML IJ SOLN: 6.2500 mg | INTRAMUSCULAR | Status: DC | PRN

## 2018-02-04 MED ORDER — LIDOCAINE HCL (CARDIAC) PF 100 MG/5ML IV SOSY
PREFILLED_SYRINGE | INTRAVENOUS | Status: DC | PRN
  Administered 2018-02-04: 60 mg via INTRAVENOUS

## 2018-02-04 MED ORDER — ONDANSETRON HCL 4 MG/2ML IJ SOLN
INTRAMUSCULAR | Status: DC | PRN
  Administered 2018-02-04: 4 mg via INTRAVENOUS

## 2018-02-04 MED ORDER — FENTANYL CITRATE (PF) 100 MCG/2ML IJ SOLN
INTRAMUSCULAR | Status: AC
  Filled 2018-02-04: qty 2

## 2018-02-04 MED ORDER — OXYCODONE HCL 5 MG PO TABS: 5.0000 mg | ORAL_TABLET | Freq: Once | ORAL | Status: DC | PRN

## 2018-02-04 MED ORDER — SILVER NITRATE-POT NITRATE 75-25 % EX MISC
CUTANEOUS | Status: AC
  Filled 2018-02-04: qty 1

## 2018-02-04 MED ORDER — PROPOFOL 10 MG/ML IV BOLUS
INTRAVENOUS | Status: AC
  Filled 2018-02-04: qty 40

## 2018-02-04 MED ORDER — MEGESTROL ACETATE 20 MG PO TABS
40.0000 mg | ORAL_TABLET | Freq: Four times a day (QID) | ORAL | Status: DC
  Administered 2018-02-04 – 2018-02-05 (×3): 40 mg via ORAL
  Filled 2018-02-04 (×6): qty 2

## 2018-02-04 MED ORDER — FENTANYL CITRATE (PF) 100 MCG/2ML IJ SOLN: 25.0000 ug | INTRAMUSCULAR | Status: DC | PRN

## 2018-02-04 MED ORDER — DEXAMETHASONE SODIUM PHOSPHATE 10 MG/ML IJ SOLN
INTRAMUSCULAR | Status: DC | PRN
  Administered 2018-02-04: 5 mg via INTRAVENOUS

## 2018-02-04 MED ORDER — PROPOFOL 10 MG/ML IV BOLUS
INTRAVENOUS | Status: DC | PRN
  Administered 2018-02-04: 20 mg via INTRAVENOUS
  Administered 2018-02-04: 80 mg via INTRAVENOUS

## 2018-02-04 MED ORDER — ONDANSETRON HCL 4 MG/2ML IJ SOLN
INTRAMUSCULAR | Status: AC
  Filled 2018-02-04: qty 2

## 2018-02-04 MED ORDER — OXYCODONE HCL 5 MG/5ML PO SOLN: 5.0000 mg | Freq: Once | ORAL | Status: DC | PRN

## 2018-02-04 MED ORDER — ALPRAZOLAM 0.5 MG PO TABS
0.5000 mg | ORAL_TABLET | Freq: Once | ORAL | Status: AC
  Administered 2018-02-04: 0.5 mg via ORAL
  Filled 2018-02-04: qty 1

## 2018-02-04 MED ORDER — PROMETHAZINE HCL 25 MG/ML IJ SOLN: 6.2500 mg | INTRAMUSCULAR | Status: DC | PRN

## 2018-02-04 MED ORDER — ALPRAZOLAM 0.25 MG PO TABS: 0.2500 mg | ORAL_TABLET | Freq: Once | ORAL | Status: DC

## 2018-02-04 MED ORDER — DEXAMETHASONE SODIUM PHOSPHATE 10 MG/ML IJ SOLN
INTRAMUSCULAR | Status: AC
  Filled 2018-02-04: qty 1

## 2018-02-04 MED ORDER — FENTANYL CITRATE (PF) 100 MCG/2ML IJ SOLN
INTRAMUSCULAR | Status: DC | PRN
  Administered 2018-02-04 (×2): 25 ug via INTRAVENOUS
  Administered 2018-02-04: 50 ug via INTRAVENOUS

## 2018-02-04 SURGICAL SUPPLY — 21 items
CATH ROBINSON RED A/P 16FR (CATHETERS) ×2 IMPLANT
COVER WAND RF STERILE (DRAPES) ×2 IMPLANT
DEVICE MYOSURE LITE (MISCELLANEOUS) IMPLANT
DEVICE MYOSURE REACH (MISCELLANEOUS) IMPLANT
ELECT REM PT RETURN 9FT ADLT (ELECTROSURGICAL) ×2
ELECTRODE REM PT RTRN 9FT ADLT (ELECTROSURGICAL) ×1 IMPLANT
GLOVE PI ORTHOPRO 6.5 (GLOVE) ×1
GLOVE PI ORTHOPRO STRL 6.5 (GLOVE) ×1 IMPLANT
GLOVE SURG SYN 6.5 ES PF (GLOVE) ×4 IMPLANT
GOWN STRL REUS W/ TWL LRG LVL3 (GOWN DISPOSABLE) ×2 IMPLANT
GOWN STRL REUS W/TWL LRG LVL3 (GOWN DISPOSABLE) ×2
KIT PROCEDURE FLUENT (KITS) IMPLANT
KIT TURNOVER CYSTO (KITS) ×2 IMPLANT
MYOSURE LITE POLYP REMOVAL (MISCELLANEOUS) ×2 IMPLANT
PACK DNC HYST (MISCELLANEOUS) ×2 IMPLANT
PAD OB MATERNITY 4.3X12.25 (PERSONAL CARE ITEMS) ×2 IMPLANT
PAD PREP 24X41 OB/GYN DISP (PERSONAL CARE ITEMS) ×2 IMPLANT
SEAL ROD LENS SCOPE MYOSURE (ABLATOR) ×2 IMPLANT
SOL .9 NS 3000ML IRR  AL (IV SOLUTION) ×1
SOL .9 NS 3000ML IRR UROMATIC (IV SOLUTION) ×1 IMPLANT
TUBING CONNECTING 10 (TUBING) ×2 IMPLANT

## 2018-02-04 NOTE — Plan of Care (Signed)
  Problem: Spiritual Needs Goal: Ability to function at adequate level Outcome: Progressing   Problem: Education: Goal: Knowledge of General Education information will improve Description Including pain rating scale, medication(s)/side effects and non-pharmacologic comfort measures Outcome: Progressing   Problem: Clinical Measurements: Goal: Will remain free from infection Outcome: Progressing Goal: Diagnostic test results will improve Outcome: Progressing   Problem: Activity: Goal: Risk for activity intolerance will decrease Outcome: Progressing   Problem: Pain Managment: Goal: General experience of comfort will improve Outcome: Progressing   Problem: Safety: Goal: Ability to remain free from injury will improve Outcome: Progressing   Problem: Skin Integrity: Goal: Risk for impaired skin integrity will decrease Outcome: Progressing

## 2018-02-04 NOTE — Anesthesia Procedure Notes (Signed)
Procedure Name: LMA Insertion Performed by: Infiniti Hoefling, CRNA Pre-anesthesia Checklist: Patient identified, Patient being monitored, Timeout performed, Emergency Drugs available and Suction available Patient Re-evaluated:Patient Re-evaluated prior to induction Oxygen Delivery Method: Circle system utilized Preoxygenation: Pre-oxygenation with 100% oxygen Induction Type: IV induction Ventilation: Mask ventilation without difficulty LMA: LMA inserted LMA Size: 3.5 Tube type: Oral Number of attempts: 1 Placement Confirmation: positive ETCO2 and breath sounds checked- equal and bilateral Tube secured with: Tape Dental Injury: Teeth and Oropharynx as per pre-operative assessment        

## 2018-02-04 NOTE — Anesthesia Preprocedure Evaluation (Signed)
Anesthesia Evaluation  Patient identified by MRN, date of birth, ID band Patient awake    Reviewed: Allergy & Precautions, NPO status , Patient's Chart, lab work & pertinent test results  History of Anesthesia Complications Negative for: history of anesthetic complications  Airway Mallampati: II  TM Distance: >3 FB Neck ROM: Full    Dental  (+) Edentulous Upper, Edentulous Lower   Pulmonary neg pulmonary ROS, neg sleep apnea, neg COPD,    breath sounds clear to auscultation- rhonchi (-) wheezing      Cardiovascular (-) hypertension(-) CAD, (-) Past MI, (-) Cardiac Stents and (-) CABG  Rhythm:Regular Rate:Normal - Systolic murmurs and - Diastolic murmurs    Neuro/Psych PSYCHIATRIC DISORDERS Anxiety Depression negative neurological ROS     GI/Hepatic GERD  ,  Endo/Other  neg diabetesHypothyroidism   Renal/GU negative Renal ROS     Musculoskeletal  (+) Arthritis ,   Abdominal (+) - obese,   Peds  Hematology  (+) anemia ,   Anesthesia Other Findings Past Medical History: No date: Anxiety No date: Depression No date: Fatty liver No date: GERD (gastroesophageal reflux disease) No date: Hypothyroidism No date: Obesity No date: Thyroid disease   Reproductive/Obstetrics                             Anesthesia Physical Anesthesia Plan  ASA: II  Anesthesia Plan: General   Post-op Pain Management:    Induction: Intravenous  PONV Risk Score and Plan: 2 and Ondansetron and Midazolam  Airway Management Planned: LMA  Additional Equipment:   Intra-op Plan:   Post-operative Plan:   Informed Consent: I have reviewed the patients History and Physical, chart, labs and discussed the procedure including the risks, benefits and alternatives for the proposed anesthesia with the patient or authorized representative who has indicated his/her understanding and acceptance.   Dental advisory  given  Plan Discussed with: CRNA and Anesthesiologist  Anesthesia Plan Comments:         Anesthesia Quick Evaluation

## 2018-02-04 NOTE — Progress Notes (Signed)
Preoperative History and Physical  Cassidy [name] is a 77 y.o. with postmenopausal bleeding with chronic thrombocytopenia and evidence of uterine polyp on ultrasound.  Proposed surgery: D&C hysteroscopy  Patient to receive platelets transfusion 1hr prior to procedure.  Past Medical History:  Diagnosis Date  . Anxiety   . Depression   . Fatty liver   . GERD (gastroesophageal reflux disease)   . Hypothyroidism   . Obesity   . Thyroid disease    Past Surgical History:  Procedure Laterality Date  . APPENDECTOMY    . ESOPHAGOGASTRODUODENOSCOPY (EGD) WITH PROPOFOL N/A 12/20/2014   Procedure: ESOPHAGOGASTRODUODENOSCOPY (EGD) WITH PROPOFOL;  Surgeon: Manya Silvas, MD;  Location: [facility] ENDOSCOPY;  Service: Endoscopy;  Laterality: N/A;  . SAVORY DILATION N/A 12/20/2014   Procedure: SAVORY DILATION;  Surgeon: Manya Silvas, MD;  Location: [facility] ENDOSCOPY;  Service: Endoscopy;  Laterality: N/A;  . TONSILLECTOMY     OB History  No data available  Patient denies any other pertinent gynecologic issues.   No current facility-administered medications on file prior to encounter.    Current Outpatient Medications on File Prior to Encounter  Medication Sig Dispense Refill  . acidophilus (RISAQUAD) CAPS capsule Take 1 capsule by mouth 2 (two) times daily.    . busPIRone (BUSPAR) 15 MG tablet Take 1 tablet (15 mg total) by mouth at bedtime. 90 tablet 1  . clonazePAM (KLONOPIN) 0.5 MG tablet Take 1 tablet (0.5 mg total) by mouth daily as needed for anxiety. Only for severe anxiety sx 20 tablet 1  . escitalopram (LEXAPRO) 10 MG tablet Take 1 tablet (10 mg total) by mouth daily. 90 tablet 1  . levothyroxine (SYNTHROID, LEVOTHROID) 100 MCG tablet Take 1 tablet (100 mcg total) by mouth daily before breakfast. 30 tablet 0  . loperamide (IMODIUM) 2 MG capsule Take 2 mg by mouth as needed for diarrhea or loose stools.    . megestrol (MEGACE) 20 MG tablet Take 1 tablet (20 mg total) by mouth 2 (two)  times daily. 60 tablet 0  . Melatonin 1 MG TABS Take 5 mg by mouth at bedtime as needed.    Marland Kitchen omeprazole (PRILOSEC) 40 MG capsule Take 1 capsule (40 mg total) by mouth daily. 30 capsule 1  . potassium chloride (K-DUR) 10 MEQ tablet Take 10 mEq by mouth daily.    . QUEtiapine (SEROQUEL) 100 MG tablet Take 1 tablet (100 mg total) by mouth at bedtime. 90 tablet 1  . traZODone (DESYREL) 150 MG tablet Take 1 tablet (150 mg total) by mouth at bedtime. 90 tablet 1  . traZODone (DESYREL) 50 MG tablet Take 1 tablet (50 mg total) by mouth at bedtime as needed for sleep. To be combined with 150 mg as needed 90 tablet 1  . XIFAXAN 550 MG TABS tablet Take 1 tablet by mouth 2 (two) times daily.    . fluticasone (FLONASE) 50 MCG/ACT nasal spray Place 2 sprays into both nostrils daily. (Patient not taking: Reported on 01/14/2018) 16 g 2  . ondansetron (ZOFRAN ODT) 4 MG disintegrating tablet Take 1 tablet (4 mg total) by mouth every 8 (eight) hours as needed for nausea or vomiting. (Patient not taking: Reported on 01/14/2018) 25 tablet 0   Allergies  Allergen Reactions  . Sulfa Antibiotics Shortness Of Breath    Social History:   reports that she has never smoked. She has never used smokeless tobacco. She reports that she does not drink alcohol or use drugs.  Family History  Problem  Relation Age of Onset  . Diabetes Mother   . COPD Mother   . Kidney disease Mother   . Depression Mother   . Hypertension Mother   . Heart attack Father   . Aneurysm Father   . Anxiety disorder Sister   . Depression Sister   . Diabetes Sister   . Hypertension Sister   . Atrial fibrillation Brother   . Spinal muscular atrophy Brother   . Breast cancer Sister   . Bone cancer Sister   . Depression Sister   . Anxiety disorder Sister   . Liver cancer Sister   . Breast cancer Sister   . Colon cancer Sister   . Depression Sister   . Diabetes Sister   . COPD Sister   . Depression Sister   . Anxiety disorder Sister   .  Skin cancer Sister   . Diabetes Brother   . Depression Brother   . Skin cancer Maternal Aunt   . Diabetes Maternal Aunt   . Cervical cancer Paternal Aunt     Review of Systems: Noncontributory  PHYSICAL EXAM: Blood pressure (!) 124/50, pulse 86, temperature 98.3 F (36.8 C), temperature source Oral, resp. rate 16, height 5' 1"  (1.549 m), weight 56.9 kg, SpO2 93 %. General appearance - alert, well appearing, and in no distress Chest - clear to auscultation, no wheezes, rales or rhonchi, symmetric air entry Heart - normal rate and regular rhythm Abdomen - soft, nontender, nondistended, no masses or organomegaly Pelvic - examination not indicated Extremities - peripheral pulses normal, no pedal edema, no clubbing or cyanosis  Labs: Results for orders placed or performed during the hospital encounter of 02/01/18 (from the past 336 hour(s))  Comprehensive metabolic panel   Collection Time: 02/01/18  2:04 PM  Result Value Ref Range   Sodium 138 135 - 145 mmol/L   Potassium 3.8 3.5 - 5.1 mmol/L   Chloride 107 98 - 111 mmol/L   CO2 22 22 - 32 mmol/L   Glucose, Bld 152 (H) 70 - 99 mg/dL   BUN 10 8 - 23 mg/dL   Creatinine, Ser 0.69 0.44 - 1.00 mg/dL   Calcium 8.2 (L) 8.9 - 10.3 mg/dL   Total Protein 6.2 (L) 6.5 - 8.1 g/dL   Albumin 3.3 (L) 3.5 - 5.0 g/dL   AST 35 15 - 41 U/L   ALT 16 0 - 44 U/L   Alkaline Phosphatase 70 38 - 126 U/L   Total Bilirubin 1.4 (H) 0.3 - 1.2 mg/dL   GFR calc non Af Amer >60 >60 mL/min   GFR calc Af Amer >60 >60 mL/min   Anion gap 9 5 - 15  CBC   Collection Time: 02/01/18  2:04 PM  Result Value Ref Range   WBC 3.0 (L) 4.0 - 10.5 K/uL   RBC 4.29 3.87 - 5.11 MIL/uL   Hemoglobin 12.8 12.0 - 15.0 g/dL   HCT 39.3 36.0 - 46.0 %   MCV 91.6 80.0 - 100.0 fL   MCH 29.8 26.0 - 34.0 pg   MCHC 32.6 30.0 - 36.0 g/dL   RDW 17.5 (H) 11.5 - 15.5 %   Platelets 67 (L) 150 - 400 K/uL   nRBC 0.0 0.0 - 0.2 %  Type and screen [place]    Collection Time: 02/01/18  2:05 PM  Result Value Ref Range   ABO/RH(D) O POS    Antibody Screen NEG    Sample Expiration      02/04/2018  Performed at [facility], [address] [place], [place] [address]   Urinalysis, Complete w Microscopic   Collection Time: 02/01/18  5:05 PM  Result Value Ref Range   Color, Urine AMBER (A) YELLOW   APPearance CLEAR (A) CLEAR   Specific Gravity, Urine 1.014 1.005 - 1.030   pH 6.0 5.0 - 8.0   Glucose, UA NEGATIVE NEGATIVE mg/dL   Hgb urine dipstick LARGE (A) NEGATIVE   Bilirubin Urine NEGATIVE NEGATIVE   Ketones, ur NEGATIVE NEGATIVE mg/dL   Protein, ur NEGATIVE NEGATIVE mg/dL   Nitrite NEGATIVE NEGATIVE   Leukocytes, UA NEGATIVE NEGATIVE   RBC / HPF >50 (H) 0 - 5 RBC/hpf   WBC, UA 6-10 0 - 5 WBC/hpf   Bacteria, UA NONE SEEN NONE SEEN   Squamous Epithelial / LPF 0-5 0 - 5   Mucus PRESENT   CBC with Differential   Collection Time: 02/01/18  6:12 PM  Result Value Ref Range   WBC 2.4 (L) 4.0 - 10.5 K/uL   RBC 3.35 (L) 3.87 - 5.11 MIL/uL   Hemoglobin 10.2 (L) 12.0 - 15.0 g/dL   HCT 31.5 (L) 36.0 - 46.0 %   MCV 94.0 80.0 - 100.0 fL   MCH 30.4 26.0 - 34.0 pg   MCHC 32.4 30.0 - 36.0 g/dL   RDW 17.4 (H) 11.5 - 15.5 %   Platelets 45 (L) 150 - 400 K/uL   nRBC 0.0 0.0 - 0.2 %   Neutrophils Relative % 66 %   Neutro Abs 1.6 (L) 1.7 - 7.7 K/uL   Lymphocytes Relative 23 %   Lymphs Abs 0.5 (L) 0.7 - 4.0 K/uL   Monocytes Relative 9 %   Monocytes Absolute 0.2 0.1 - 1.0 K/uL   Eosinophils Relative 2 %   Eosinophils Absolute 0.0 0.0 - 0.5 K/uL   Basophils Relative 0 %   Basophils Absolute 0.0 0.0 - 0.1 K/uL   Smear Review PLATELET COUNT CONFIRMED BY SMEAR    Immature Granulocytes 0 %   Abs Immature Granulocytes 0.00 0.00 - 0.07 K/uL  CBC   Collection Time: 02/02/18  6:18 AM  Result Value Ref Range   WBC 2.1 (L) 4.0 - 10.5 K/uL   RBC 3.93 3.87 - 5.11 MIL/uL   Hemoglobin 11.7 (L) 12.0 - 15.0 g/dL   HCT 36.1 36.0 - 46.0 %   MCV 91.9  80.0 - 100.0 fL   MCH 29.8 26.0 - 34.0 pg   MCHC 32.4 30.0 - 36.0 g/dL   RDW 17.1 (H) 11.5 - 15.5 %   Platelets 51 (L) 150 - 400 K/uL   nRBC 0.0 0.0 - 0.2 %  Pathologist smear review   Collection Time: 02/02/18  6:18 AM  Result Value Ref Range   Path Review      Blood smear and history reviewed. There is thrombocytopenia without clumping, which is chronic by history. RBCs are unremarkable. There is mild neutropenia and lymphopenia. Lymph count has been low for years, but the neutropenia is new. No abnormal or  immature cells are seen.   Protime-INR   Collection Time: 02/03/18  4:40 AM  Result Value Ref Range   Prothrombin Time 18.8 (H) 11.4 - 15.2 seconds   INR 1.59   APTT   Collection Time: 02/03/18  4:40 AM  Result Value Ref Range   aPTT 42 (H) 24 - 36 seconds  CBC   Collection Time: 02/03/18  9:58 AM  Result Value Ref Range   WBC 2.6 (  L) 4.0 - 10.5 K/uL   RBC 4.00 3.87 - 5.11 MIL/uL   Hemoglobin 12.0 12.0 - 15.0 g/dL   HCT 36.4 36.0 - 46.0 %   MCV 91.0 80.0 - 100.0 fL   MCH 30.0 26.0 - 34.0 pg   MCHC 33.0 30.0 - 36.0 g/dL   RDW 17.2 (H) 11.5 - 15.5 %   Platelets 57 (L) 150 - 400 K/uL   nRBC 0.0 0.0 - 0.2 %  Prepare Pheresed Platelets   Collection Time: 02/03/18 11:16 AM  Result Value Ref Range   Unit Number [id number]    Blood Component Type PLTPHER LR2    Unit division 00    Status of Unit ALLOCATED    Transfusion Status      OK TO TRANSFUSE Performed at [facility], [address] [place], [place] [address]   BPAM Platelet Pheresis   Collection Time: 02/03/18 11:16 AM  Result Value Ref Range   Blood Product Unit Number [id number]    Unit Type and Rh 5100    Blood Product Expiration Date [id number]   Hemoglobin and Hematocrit   Collection Time: 02/03/18 11:48 AM  Result Value Ref Range   Hemoglobin 12.4 12.0 - 15.0 g/dL   HCT 38.2 36.0 - 46.0 %  CBC   Collection Time: 02/04/18  5:23 AM  Result Value Ref Range   WBC 2.6 (L) 4.0 - 10.5  K/uL   RBC 4.04 3.87 - 5.11 MIL/uL   Hemoglobin 11.9 (L) 12.0 - 15.0 g/dL   HCT 36.9 36.0 - 46.0 %   MCV 91.3 80.0 - 100.0 fL   MCH 29.5 26.0 - 34.0 pg   MCHC 32.2 30.0 - 36.0 g/dL   RDW 17.2 (H) 11.5 - 15.5 %   Platelets 67 (L) 150 - 400 K/uL   nRBC 0.0 0.0 - 0.2 %    Imaging Studies: US Pelvic Complete With Transvaginal  Result Date: 02/01/2018 CLINICAL DATA:  Vaginal bleeding for 3 days EXAM: TRANSABDOMINAL AND TRANSVAGINAL ULTRASOUND OF PELVIS TECHNIQUE: Both transabdominal and transvaginal ultrasound examinations of the pelvis were performed. Transabdominal technique was performed for global imaging of the pelvis including uterus, ovaries, adnexal regions, and pelvic cul-de-sac. It was necessary to proceed with endovaginal exam following the transabdominal exam to visualize the uterus, endometrium, and ovaries. Transvaginal imaging was limited by patient discomfort. COMPARISON:  None FINDINGS: Uterus Measurements: 5.9 x 3.3 x 3.7 cm. Retroverted. Normal morphology without mass Endometrium Thickness: 16 mm. Abnormal appearing heterogeneous endometrial complex. No focal mass lesion delineated. Right ovary Not visualized on either transabdominal or endovaginal imaging, likely obscured by bowel Left ovary Not visualized on either transabdominal or endovaginal imaging, likely obscured by bowel Other findings No abnormal free fluid. IMPRESSION: Nonvisualization of ovaries. Abnormal appearing heterogeneous endometrial complex 16 mm thick; if bleeding remains unresponsive to hormonal or medical therapy, focal lesion work-up with sonohysterogram should be considered. Endometrial biopsy should also be considered in pre-menopausal patients at high risk for endometrial carcinoma. (Ref: Radiological Reasoning: Algorithmic Workup of Abnormal Vaginal Bleeding with Endovaginal Sonography and Sonohysterography. AJR 2008; 702:O37-85) Electronically Signed   By: Lavonia Dana M.D.   On: 02/01/2018 16:36     Assessment: Patient Active Problem List   Diagnosis Date Noted  . Protein-calorie malnutrition, severe 02/04/2018  . Symptomatic anemia 02/03/2018  . Anemia 02/01/2018  . Leukopenia 12/01/2017  . Dysphagia 11/21/2017  . Emesis, persistent 11/20/2017  . Severe recurrent major depression without psychotic features ([place]) 06/19/2016  . Hepatic  encephalopathy ([place]) 08/13/2015  . Thrombocytopenia ([place]) 07/23/2015  . Amnesia 03/20/2015  . Major depression in remission ([place]) 03/12/2015  . Gonalgia 01/05/2015  . Absolute anemia 11/27/2014  . Arthritis 11/27/2014  . Acid reflux 11/27/2014  . Depression, major, recurrent, in partial remission ([place]) 11/27/2014  . Encounter for general adult medical examination without abnormal findings 08/25/2014  . Chronic LBP 08/15/2014  . Complete rotator cuff rupture of left shoulder 05/01/2014  . Infraspinatus tenosynovitis 05/01/2014  . Other synovitis and tenosynovitis, right shoulder 05/01/2014  . Difficulty in walking 11/30/2013  . Extreme obesity 11/30/2013  . Morbid obesity ([place]) 11/30/2013  . Arthritis, degenerative 10/01/2013  . Steatohepatitis 10/01/2013  . Orthostasis 10/01/2013  . 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 Fall 09/26/2013  . Dizziness 09/07/2013  . Osteoporosis with fracture 09/06/2013  . Clinical depression 03/25/2012  . Adult hypothyroidism 03/25/2012  . Esophageal stenosis 07/03/2009  . Back ache 03/08/2003  . Anxiety state 12/27/2002    Plan: Patient will undergo surgical management with D&C hysteroscopy.   The risks of surgery were discussed in detail with the patient including but not limited to: bleeding which may require transfusion or reoperation; infection which may require antibiotics; injury to surrounding organs which may involve bowel, bladder, ureters ; need for additional procedures including laparoscopy or laparotomy; thromboembolic phenomenon, surgical site problems and other postoperative/anesthesia  complications. Likelihood of success in alleviating the patient's condition was discussed. Routine postoperative instructions will be reviewed with the patient and her family in detail after surgery.  The patient concurred with the proposed plan, giving informed written consent for the surgery.  Patient has been NPO since last night she will remain NPO for procedure.  Anesthesia and OR aware.  Platelets ordered and to be transfused 1hr prior to procedure per hematology.  To OR when scheduled.  ----- Larey Days, MD Attending Obstetrician and Gynecologist [facility], Department of [facility]

## 2018-02-04 NOTE — Progress Notes (Signed)
[place] [facility] [place] NAME: Cassidy [name]    MR#:  [id number]  DATE OF BIRTH:  11-28-1940  SUBJECTIVE: Complaints of anxiety before D&C, requesting Xanax.  Decreased vaginal bleeding since last night.  CHIEF COMPLAINT:   Chief Complaint  Patient presents with  . Bleeding/Bruising  Has vaginal bleeding.  Pelvic ultrasound is unremarkable.  REVIEW OF SYSTEMS:   ROS CONSTITUTIONAL: No fever, fatigue or weakness.  EYES: No blurred or double vision.  EARS, NOSE, AND THROAT: No tinnitus or ear pain.  RESPIRATORY: No cough, shortness of breath, wheezing or hemoptysis.  CARDIOVASCULAR: No chest pain, orthopnea, edema.  GASTROINTESTINAL: No nausea, vomiting, diarrhea or abdominal pain.  GENITOURINARY: No dysuria, hematuria.  ENDOCRINE: No polyuria, nocturia,  HEMATOLOGY patient has vaginal bleeding. SKIN: No rash or lesion. MUSCULOSKELETAL: No joint pain or arthritis.   NEUROLOGIC: No tingling, numbness, weakness.  PSYCHIATRY: No anxiety or depression.   DRUG ALLERGIES:   Allergies  Allergen Reactions  . Sulfa Antibiotics Shortness Of Breath    VITALS:  Blood pressure (!) 133/50, pulse 87, temperature 98.6 F (37 C), temperature source Oral, resp. rate 16, height 5' 1"  (1.549 m), weight 56.9 kg, SpO2 93 %.  PHYSICAL EXAMINATION:  GENERAL:  77 y.o.-year-old patient lying in the bed with no acute distress.  EYES: Pupils equal, round, reactive to light and accommodation. No scleral icterus. Extraocular muscles intact.  HEENT: Head atraumatic, normocephalic. Oropharynx and nasopharynx clear.  NECK:  Supple, no jugular venous distention. No thyroid enlargement, no tenderness.  LUNGS: Normal breath sounds bilaterally, no wheezing, rales,rhonchi or crepitation. No use of accessory muscles of respiration.  CARDIOVASCULAR: S1, S2 normal. No murmurs, rubs, or gallops.  ABDOMEN: Soft, nontender, nondistended. Bowel sounds present. No  organomegaly or mass.  EXTREMITIES: No pedal edema, cyanosis, or clubbing.  NEUROLOGIC: Cranial nerves II through XII are intact. Muscle strength 5/5 in all extremities. Sensation intact. Gait not checked.  PSYCHIATRIC: The patient is alert and oriented x 3.  Anxious. SKIN: No obvious rash, lesion, or ulcer.    LABORATORY PANEL:   CBC Recent Labs  Lab 02/04/18 0523  WBC 2.6*  HGB 11.9*  HCT 36.9  PLT 67*   ------------------------------------------------------------------------------------------------------------------  Chemistries  Recent Labs  Lab 02/01/18 1404  NA 138  K 3.8  CL 107  CO2 22  GLUCOSE 152*  BUN 10  CREATININE 0.69  CALCIUM 8.2*  AST 35  ALT 16  ALKPHOS 70  BILITOT 1.4*   ------------------------------------------------------------------------------------------------------------------  Cardiac Enzymes No results for input(s): TROPONINI in the last 168 hours. ------------------------------------------------------------------------------------------------------------------  RADIOLOGY:  No results found.  EKG:   Orders placed or performed during the hospital encounter of 11/20/17  . ED EKG  . ED EKG  . EKG 12-Lead  . EKG 12-Lead  . EKG    ASSESSMENT AND PLAN:  Chronic thrombocytopenia, followed by Dr. Tasia Catchings, had extensive blood work including normal B12, folate, negative hepatitis panel, HIV, ANA, peripheral smear.  Patient has absolute lymphopenia and neutropenia, has history of splenomegaly, which can contribute to thrombocytopenia as per Dr. Collie Siad note, the platelets are at 40.   Seen by By Dr. Tasia Catchings. 2.  Vaginal bleeding likely due to thrombocytopenia.  Patient will get 2 units of platelet transfusion 1 hour before D&C, discussed with Dr. you, Dr. Vikki Ports Ward  3/.medical problems include hypothyroidism, GERD, major depression, osteoarthritis, steatohepatitis, anxiety, Continue Klonopin, BuSpar patient  Gives Xanax before procedure for  anxiety.,  Discussed plan  with patient's sister at bedside.  is on rifaximin for possible chronic hepatic encephalopathy  More than 50% time spent in counseling, coordination of care, discussed the results with patient and also reviewed medical records from Dr. Collie Siad note.    all the records are reviewed and case discussed with Care Management/Social Workerr. Management plans discussed with the patient, family and they are in agreement.  CODE STATUS: Full code  TOTAL TIME TAKING CARE OF THIS PATIENT: 40 minutes.   POSSIBLE D/C IN 1-2 DAYS, DEPENDING ON CLINICAL CONDITION.   Epifanio Lesches M.D on 02/04/2018 at 11:54 AM  Between 7am to 6pm - Pager - [phone]  After 6pm go to [email or web address] - password [email or web address] [facility]  [place] [facility]  Office  [phone]  CC: Primary care physician; Kirk Ruths, MD   Note: This dictation was prepared with Dragon dictation along with smaller phrase technology. Any transcriptional errors that result from this process are unintentional.

## 2018-02-04 NOTE — Consult Note (Signed)
Consult History and Physical   SERVICE: Gynecology   Patient Name: Cassidy [name] Patient MRN:   [id number]  CC: vaginal bleeding with thrombocytopenia  HPI: Cassidy [name] Nuzum is a 77 y.o. with history of hepatic encephalopathy, chronic thrombocytopenia and leukopenia, and other co-morbidities as listed below, presented to the ED with bleeding from the bladder/vagina/rectum area.  She was found to have vaginal/uterine bleeding on exam in the ED and ultrasound an endometrial thickening of 1.6cm.  She has had thrombocytopenia and leukopenia for at least 4 years (earliest records here 2015), but during this time has never had an episode of vaginal bleeding.    She has cognitive impairments and her sister is POA and her historian.   Unknown if abnormal paps in the past. Bleeding started 02/01/18, is like a light period, bright red.   Review of Systems: positives in bold GEN:   fevers, chills, weight changes, appetite changes, fatigue, night sweats HEENT:  HA, vision changes, hearing loss, congestion, rhinorrhea, sinus pressure, dysphagia CV:   CP, palpitations PULM:  SOB, cough GI:  abd pain, N/V/D/C GU:  dysuria, urgency, frequency MSK:  arthralgias, myalgias, back pain, swelling SKIN:  rashes, color changes, pallor NEURO:  numbness, weakness, tingling, seizures, dizziness, tremors PSYCH:  depression, anxiety, behavioral problems, confusion  HEME/LYMPH:  easy bruising or bleeding ENDO:  heat/cold intolerance  Past Obstetrical History: OB History   None     Past Gynecologic History: No LMP recorded (lmp unknown). Patient is postmenopausal.   Past Medical History: Past Medical History:  Diagnosis Date  . Anxiety   . Depression   . Fatty liver   . GERD (gastroesophageal reflux disease)   . Hypothyroidism   . Obesity   . Thyroid disease     Past Surgical History:   Past Surgical History:  Procedure Laterality Date  . APPENDECTOMY    . ESOPHAGOGASTRODUODENOSCOPY (EGD) WITH  PROPOFOL N/A 12/20/2014   Procedure: ESOPHAGOGASTRODUODENOSCOPY (EGD) WITH PROPOFOL;  Surgeon: Manya Silvas, MD;  Location[facility] ENDOSCOPY;  Service: Endoscopy;  Laterality: N/A;  . SAVORY DILATION N/A 12/20/2014   Procedure: SAVORY DILATION;  Surgeon: Manya Silvas, MD;  Location: [facility] ENDOSCOPY;  Service: Endoscopy;  Laterality: N/A;  . TONSILLECTOMY      Family History:  family history includes Aneurysm in her father; Anxiety disorder in her sister, sister, and sister; Atrial fibrillation in her brother; Bone cancer in her sister; Breast cancer in her sister and sister; COPD in her mother and sister; Cervical cancer in her paternal aunt; Colon cancer in her sister; Depression in her brother, mother, sister, sister, sister, and sister; Diabetes in her brother, maternal aunt, mother, sister, and sister; Heart attack in her father; Hypertension in her mother and sister; Kidney disease in her mother; Liver cancer in her sister; Skin cancer in her maternal aunt and sister; Spinal muscular atrophy in her brother.  Social History:  Social History   Socioeconomic History  . Marital status: Widowed    Spouse name: Not on file  . Number of children: 3  . Years of education: Not on file  . Highest education level: Not on file  Occupational History  . Not on file  Social Needs  . Financial resource strain: Not on file  . Food insecurity:    Worry: Not on file    Inability: Not on file  . Transportation needs:    Medical: Not on file    Non-medical: Not on file  Tobacco Use  . Smoking  status: Never Smoker  . Smokeless tobacco: Never Used  Substance and Sexual Activity  . Alcohol use: No  . Drug use: No  . Sexual activity: Not Currently    Birth control/protection: Post-menopausal  Lifestyle  . Physical activity:    Days per week: Not on file    Minutes per session: Not on file  . Stress: Not on file  Relationships  . Social connections:    Talks on phone: Not on file    Gets  together: Not on file    Attends religious service: Not on file    Active member of club or organization: Not on file    Attends meetings of clubs or organizations: Not on file    Relationship status: Not on file  . Intimate partner violence:    Fear of current or ex partner: Not on file    Emotionally abused: Not on file    Physically abused: Not on file    Forced sexual activity: Not on file  Other Topics Concern  . Not on file  Social History Narrative  . Not on file    Home Medications:  Medications reconciled in EPIC  No current facility-administered medications on file prior to encounter.    Current Outpatient Medications on File Prior to Encounter  Medication Sig Dispense Refill  . acidophilus (RISAQUAD) CAPS capsule Take 1 capsule by mouth 2 (two) times daily.    . busPIRone (BUSPAR) 15 MG tablet Take 1 tablet (15 mg total) by mouth at bedtime. 90 tablet 1  . clonazePAM (KLONOPIN) 0.5 MG tablet Take 1 tablet (0.5 mg total) by mouth daily as needed for anxiety. Only for severe anxiety sx 20 tablet 1  . escitalopram (LEXAPRO) 10 MG tablet Take 1 tablet (10 mg total) by mouth daily. 90 tablet 1  . levothyroxine (SYNTHROID, LEVOTHROID) 100 MCG tablet Take 1 tablet (100 mcg total) by mouth daily before breakfast. 30 tablet 0  . loperamide (IMODIUM) 2 MG capsule Take 2 mg by mouth as needed for diarrhea or loose stools.    . megestrol (MEGACE) 20 MG tablet Take 1 tablet (20 mg total) by mouth 2 (two) times daily. 60 tablet 0  . Melatonin 1 MG TABS Take 5 mg by mouth at bedtime as needed.    Marland Kitchen omeprazole (PRILOSEC) 40 MG capsule Take 1 capsule (40 mg total) by mouth daily. 30 capsule 1  . potassium chloride (K-DUR) 10 MEQ tablet Take 10 mEq by mouth daily.    . QUEtiapine (SEROQUEL) 100 MG tablet Take 1 tablet (100 mg total) by mouth at bedtime. 90 tablet 1  . traZODone (DESYREL) 150 MG tablet Take 1 tablet (150 mg total) by mouth at bedtime. 90 tablet 1  . traZODone (DESYREL) 50  MG tablet Take 1 tablet (50 mg total) by mouth at bedtime as needed for sleep. To be combined with 150 mg as needed 90 tablet 1  . XIFAXAN 550 MG TABS tablet Take 1 tablet by mouth 2 (two) times daily.    . fluticasone (FLONASE) 50 MCG/ACT nasal spray Place 2 sprays into both nostrils daily. (Patient not taking: Reported on 01/14/2018) 16 g 2  . ondansetron (ZOFRAN ODT) 4 MG disintegrating tablet Take 1 tablet (4 mg total) by mouth every 8 (eight) hours as needed for nausea or vomiting. (Patient not taking: Reported on 01/14/2018) 25 tablet 0    Allergies:  Allergies  Allergen Reactions  . Sulfa Antibiotics Shortness Of Breath    Physical Exam:  Temp:  [98.3 F (36.8 C)-98.8 F (37.1 C)] 98.3 F (36.8 C) (10/17 0456) Pulse Rate:  [78-89] 86 (10/17 0456) Resp:  [15-24] 16 (10/17 0456) BP: (115-129)/(45-54) 124/50 (10/17 0456) SpO2:  [91 %-95 %] 93 % (10/17 0456)   General Appearance:  Well developed, well nourished, no acute distress, alert and oriented, cooperative and appears stated age 24:  Normocephalic atraumatic, extraocular movements intact, moist mucous membranes, neck supple with midline trachea and thyroid without masses Cardiovascular:  Normal S1/S2, regular rate and rhythm, no murmurs, 2+ distal pulses Pulmonary:  clear to auscultation, no wheezes, rales or rhonchi, symmetric air entry, good air exchange Abdomen:  Bowel sounds present, soft, nontender, nondistended, no abnormal masses or organomegaly, no epigastric pain Back: inspection of back is normal Extremities:  extremities normal, no tenderness, atraumatic, no cyanosis or edema Skin:  normal coloration and turgor, no rashes, no suspicious skin lesions noted  Neurologic:  Cranial nerves 2-12 grossly intact, grossly equal strength and muscle tone, normal speech, no focal findings or movement disorder noted. Psychiatric:  Normal mood and affect, appropriate, no AH/VH Pelvic:  Performed by ED physician, reported no  abnormalities.  Will defer until exam under anesthesia in OR tomorrow.    Labs/Studies:   CBC and Coags:  Lab Results  Component Value Date   WBC 2.6 (L) 02/03/2018   NEUTOPHILPCT 66 02/01/2018   EOSPCT 2 02/01/2018   BASOPCT 0 02/01/2018   LYMPHOPCT 23 02/01/2018   HGB 12.4 02/03/2018   HCT 38.2 02/03/2018   MCV 91.0 02/03/2018   PLT 57 (L) 02/03/2018   INR 1.59 02/03/2018   CMP:  Lab Results  Component Value Date   NA 138 02/01/2018   K 3.8 02/01/2018   CL 107 02/01/2018   CO2 22 02/01/2018   BUN 10 02/01/2018   CREATININE 0.69 02/01/2018   CREATININE 0.68 01/14/2018   CREATININE 0.48 11/24/2017   PROT 6.2 (L) 02/01/2018   BILITOT 1.4 (H) 02/01/2018   ALT 16 02/01/2018   AST 35 02/01/2018   ALKPHOS 70 02/01/2018    Other Imaging: US Pelvic Complete With Transvaginal  Result Date: 02/01/2018 CLINICAL DATA:  Vaginal bleeding for 3 days EXAM: TRANSABDOMINAL AND TRANSVAGINAL ULTRASOUND OF PELVIS TECHNIQUE: Both transabdominal and transvaginal ultrasound examinations of the pelvis were performed. Transabdominal technique was performed for global imaging of the pelvis including uterus, ovaries, adnexal regions, and pelvic cul-de-sac. It was necessary to proceed with endovaginal exam following the transabdominal exam to visualize the uterus, endometrium, and ovaries. Transvaginal imaging was limited by patient discomfort. COMPARISON:  None FINDINGS: Uterus Measurements: 5.9 x 3.3 x 3.7 cm. Retroverted. Normal morphology without mass Endometrium Thickness: 16 mm. Abnormal appearing heterogeneous endometrial complex. No focal mass lesion delineated. Right ovary Not visualized on either transabdominal or endovaginal imaging, likely obscured by bowel Left ovary Not visualized on either transabdominal or endovaginal imaging, likely obscured by bowel Other findings No abnormal free fluid. IMPRESSION: Nonvisualization of ovaries. Abnormal appearing heterogeneous endometrial complex 16 mm  thick; if bleeding remains unresponsive to hormonal or medical therapy, focal lesion work-up with sonohysterogram should be considered. Endometrial biopsy should also be considered in pre-menopausal patients at high risk for endometrial carcinoma. (Ref: Radiological Reasoning: Algorithmic Workup of Abnormal Vaginal Bleeding with Endovaginal Sonography and Sonohysterography. AJR 2008; 941:D40-81) Electronically Signed   By: Lavonia Dana M.D.   On: 02/01/2018 16:36   Ultrasound personally reviewed by me in the room with patient and her sister.   Assessment / Plan:  Cassidy [name] is a 77 y.o. who presents with postmenopausal bleeding and chronic thrombocytopenia  1. Evidence of fundal polyp in endometrium.  While she is thrombocytopenic, she has been for 4 years and this is the first time she has had bleeding from the uterus.  She has a significant family history of various cancers but none herself and none uterine/colorectal.   It is prudent to remove the polyp and evacuate the uterine lining which is abnormal in a post menopausal state.  However, I will not do such a procedure with her platelets so low.  I ask that she be transfused prior to any procedure to ensure no post procedure hemorrhage.    Oncology suggests that her platelets work they are being sequestered in her spleen, thus timing is critical to both transfusion and procedure.   Patient is not NPO.  Will schedule tomorrow so that platelets can be transfused and she can be NPO.     Thank you for the opportunity to be involved with this patient's care.  ----- Larey Days, MD Attending Obstetrician and Gynecologist [facility], Department of [facility]

## 2018-02-04 NOTE — Anesthesia Post-op Follow-up Note (Signed)
Anesthesia QCDR form completed.        

## 2018-02-04 NOTE — Progress Notes (Signed)
Hematology/Oncology Progress Note [facility] [phone] [phone]  Patient Care Team: Kirk Ruths, MD as PCP - General (Internal Medicine)   Name of the patient: Cassidy [name]  [id number]  [birth year]  Date of visit: 02/04/18   INTERVAL HISTORY-  S/p pre-operative platelet transfusion, s/p D&C Tolerates well. Eating dinner. Sister at bedside.   Review of systems- Review of Systems  Constitutional: Positive for malaise/fatigue.  HENT: Negative for sore throat.   Respiratory: Negative for cough and shortness of breath.   Cardiovascular: Negative for chest pain.  Gastrointestinal: Negative for nausea and vomiting.  Genitourinary: Negative for dysuria.  Musculoskeletal: Negative for myalgias.  Neurological: Negative for dizziness.  Endo/Heme/Allergies: Does not bruise/bleed easily.       Vaginal bleeding  Psychiatric/Behavioral: The patient is not nervous/anxious.     Allergies  Allergen Reactions  . Sulfa Antibiotics Shortness Of Breath    Patient Active Problem List   Diagnosis Date Noted  . Protein-calorie malnutrition, severe 02/04/2018  . Symptomatic anemia 02/03/2018  . Anemia 02/01/2018  . Leukopenia 12/01/2017  . Dysphagia 11/21/2017  . Emesis, persistent 11/20/2017  . Severe recurrent major depression without psychotic features ([place]) 06/19/2016  . Hepatic encephalopathy ([place]) 08/13/2015  . Thrombocytopenia ([place]) 07/23/2015  . Amnesia 03/20/2015  . Major depression in remission ([place]) 03/12/2015  . Gonalgia 01/05/2015  . Absolute anemia 11/27/2014  . Arthritis 11/27/2014  . Acid reflux 11/27/2014  . Depression, major, recurrent, in partial remission ([place]) 11/27/2014  . Encounter for general adult medical examination without abnormal findings 08/25/2014  . Chronic LBP 08/15/2014  . Complete rotator cuff rupture of left shoulder 05/01/2014  . Infraspinatus tenosynovitis 05/01/2014  . Other synovitis and  tenosynovitis, right shoulder 05/01/2014  . Difficulty in walking 11/30/2013  . Extreme obesity 11/30/2013  . Morbid obesity ([place]) 11/30/2013  . Arthritis, degenerative 10/01/2013  . Steatohepatitis 10/01/2013  . Orthostasis 10/01/2013  . 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 Fall 09/26/2013  . Dizziness 09/07/2013  . Osteoporosis with fracture 09/06/2013  . Clinical depression 03/25/2012  . Adult hypothyroidism 03/25/2012  . Esophageal stenosis 07/03/2009  . Back ache 03/08/2003  . Anxiety state 12/27/2002     Past Medical History:  Diagnosis Date  . Anxiety   . Depression   . Fatty liver   . GERD (gastroesophageal reflux disease)   . Hypothyroidism   . Obesity   . Thyroid disease      Past Surgical History:  Procedure Laterality Date  . APPENDECTOMY    . ESOPHAGOGASTRODUODENOSCOPY (EGD) WITH PROPOFOL N/A 12/20/2014   Procedure: ESOPHAGOGASTRODUODENOSCOPY (EGD) WITH PROPOFOL;  Surgeon: Manya Silvas, MD;  Location: [facility] ENDOSCOPY;  Service: Endoscopy;  Laterality: N/A;  . SAVORY DILATION N/A 12/20/2014   Procedure: SAVORY DILATION;  Surgeon: Manya Silvas, MD;  Location: [facility] ENDOSCOPY;  Service: Endoscopy;  Laterality: N/A;  . TONSILLECTOMY      Social History   Socioeconomic History  . Marital status: Widowed    Spouse name: Not on file  . Number of children: 3  . Years of education: Not on file  . Highest education level: Not on file  Occupational History  . Not on file  Social Needs  . Financial resource strain: Not on file  . Food insecurity:    Worry: Not on file    Inability: Not on file  . Transportation needs:    Medical: Not on file    Non-medical: Not on file  Tobacco Use  . Smoking  status: Never Smoker  . Smokeless tobacco: Never Used  Substance and Sexual Activity  . Alcohol use: No  . Drug use: No  . Sexual activity: Not Currently    Birth control/protection: Post-menopausal  Lifestyle  . Physical activity:    Days per week: Not on  file    Minutes per session: Not on file  . Stress: Not on file  Relationships  . Social connections:    Talks on phone: Not on file    Gets together: Not on file    Attends religious service: Not on file    Active member of club or organization: Not on file    Attends meetings of clubs or organizations: Not on file    Relationship status: Not on file  . Intimate partner violence:    Fear of current or ex partner: Not on file    Emotionally abused: Not on file    Physically abused: Not on file    Forced sexual activity: Not on file  Other Topics Concern  . Not on file  Social History Narrative  . Not on file     Family History  Problem Relation Age of Onset  . Diabetes Mother   . COPD Mother   . Kidney disease Mother   . Depression Mother   . Hypertension Mother   . Heart attack Father   . Aneurysm Father   . Anxiety disorder Sister   . Depression Sister   . Diabetes Sister   . Hypertension Sister   . Atrial fibrillation Brother   . Spinal muscular atrophy Brother   . Breast cancer Sister   . Bone cancer Sister   . Depression Sister   . Anxiety disorder Sister   . Liver cancer Sister   . Breast cancer Sister   . Colon cancer Sister   . Depression Sister   . Diabetes Sister   . COPD Sister   . Depression Sister   . Anxiety disorder Sister   . Skin cancer Sister   . Diabetes Brother   . Depression Brother   . Skin cancer Maternal Aunt   . Diabetes Maternal Aunt   . Cervical cancer Paternal Aunt      Current Facility-Administered Medications:  .  0.9 %  sodium chloride infusion (Manually program via Guardrails IV Fluids), , Intravenous, Once, Epifanio Lesches, MD .  0.9 %  sodium chloride infusion, 250 mL, Intravenous, PRN, Demetrios Loll, MD .  acetaminophen (TYLENOL) tablet 650 mg, 650 mg, Oral, Q6H PRN **OR** acetaminophen (TYLENOL) suppository 650 mg, 650 mg, Rectal, Q6H PRN, Demetrios Loll, MD .  acidophilus (RISAQUAD) capsule 1 capsule, 1 capsule, Oral,  BID, Demetrios Loll, MD, 1 capsule at 02/04/18 2151 .  albuterol (PROVENTIL) (2.5 MG/3ML) 0.083% nebulizer solution 2.5 mg, 2.5 mg, Nebulization, Q2H PRN, Demetrios Loll, MD .  ALPRAZolam Duanne Moron) tablet 0.25 mg, 0.25 mg, Oral, Once, Epifanio Lesches, MD .  bisacodyl (DULCOLAX) EC tablet 5 mg, 5 mg, Oral, Daily PRN, Demetrios Loll, MD .  busPIRone (BUSPAR) tablet 15 mg, 15 mg, Oral, Cherene Julian, MD, 15 mg at 02/04/18 2151 .  clonazePAM (KLONOPIN) tablet 0.5 mg, 0.5 mg, Oral, Daily PRN, Demetrios Loll, MD .  Copper Gluconate TABS 2 mg, 1 tablet, Oral, Daily, Epifanio Lesches, MD, 2 mg at 02/04/18 1803 .  escitalopram (LEXAPRO) tablet 10 mg, 10 mg, Oral, Daily, Demetrios Loll, MD, 10 mg at 02/03/18 0933 .  HYDROcodone-acetaminophen (NORCO/VICODIN) 5-325 MG per tablet 1-2 tablet, 1-2 tablet, Oral,  Q4H PRN, Demetrios Loll, MD .  levothyroxine (SYNTHROID, LEVOTHROID) tablet 100 mcg, 100 mcg, Oral, QAC breakfast, Demetrios Loll, MD, 100 mcg at 02/03/18 0932 .  loperamide (IMODIUM) capsule 2 mg, 2 mg, Oral, PRN, Demetrios Loll, MD .  megestrol (MEGACE) tablet 40 mg, 40 mg, Oral, QID, Epifanio Lesches, MD, 40 mg at 02/04/18 2151 .  ondansetron (ZOFRAN) tablet 4 mg, 4 mg, Oral, Q6H PRN **OR** ondansetron (ZOFRAN) injection 4 mg, 4 mg, Intravenous, Q6H PRN, Demetrios Loll, MD .  pantoprazole (PROTONIX) EC tablet 40 mg, 40 mg, Oral, Daily, Demetrios Loll, MD, 40 mg at 02/03/18 0931 .  potassium chloride (K-DUR,KLOR-CON) CR tablet 10 mEq, 10 mEq, Oral, Daily, Demetrios Loll, MD, 10 mEq at 02/03/18 0932 .  QUEtiapine (SEROQUEL) tablet 100 mg, 100 mg, Oral, QHS, Demetrios Loll, MD, 100 mg at 02/04/18 2151 .  rifaximin (XIFAXAN) tablet 550 mg, 550 mg, Oral, BID, Demetrios Loll, MD, 550 mg at 02/04/18 2151 .  senna-docusate (Senokot-S) tablet 1 tablet, 1 tablet, Oral, QHS PRN, Demetrios Loll, MD .  sodium chloride flush (NS) 0.9 % injection 3 mL, 3 mL, Intravenous, Q12H, Demetrios Loll, MD, 3 mL at 02/04/18 2154 .  sodium chloride flush (NS) 0.9 % injection  3 mL, 3 mL, Intravenous, PRN, Demetrios Loll, MD .  traZODone (DESYREL) tablet 150 mg, 150 mg, Oral, Cherene Julian, MD, 150 mg at 02/04/18 2151 .  vitamin C (ASCORBIC ACID) tablet 250 mg, 250 mg, Oral, BID, Epifanio Lesches, MD, 250 mg at 02/04/18 2151   Physical exam:  Vitals:   02/04/18 1404 02/04/18 1414 02/04/18 1429 02/04/18 2019  BP: (!) 138/49 (!) 117/55 (!) 132/57 135/62  Pulse: 87 89 90 95  Resp: 19 18  17   Temp: 98.7 F (37.1 C)  98.3 F (36.8 C) 98.4 F (36.9 C)  TempSrc:   Oral Oral  SpO2: 97% 97% 97% 94%  Weight:      Height:       Physical Exam  Constitutional: NAD  HENT:  Head: Normocephalic and atraumatic.  Eyes: Pupils are equal, round, and reactive to light. EOM are normal.  Neck: Normal range of motion. Neck supple.  Cardiovascular: Normal rate and regular rhythm.  Pulmonary/Chest: Effort normal. No respiratory distress.  Abdominal: Soft. Bowel sounds are normal.  Musculoskeletal: Normal range of motion.  Neurological: She is alert.  Skin: Skin is warm and dry. She is not diaphoretic.  Psychiatric: Affect normal.    CMP Latest Ref Rng & Units 02/01/2018  Glucose 70 - 99 mg/dL 152(H)  BUN 8 - 23 mg/dL 10  Creatinine 0.44 - 1.00 mg/dL 0.69  Sodium 135 - 145 mmol/L 138  Potassium 3.5 - 5.1 mmol/L 3.8  Chloride 98 - 111 mmol/L 107  CO2 22 - 32 mmol/L 22  Calcium 8.9 - 10.3 mg/dL 8.2(L)  Total Protein 6.5 - 8.1 g/dL 6.2(L)  Total Bilirubin 0.3 - 1.2 mg/dL 1.4(H)  Alkaline Phos 38 - 126 U/L 70  AST 15 - 41 U/L 35  ALT 0 - 44 U/L 16   CBC Latest Ref Rng & Units 02/04/2018  WBC 4.0 - 10.5 K/uL 2.6(L)  Hemoglobin 12.0 - 15.0 g/dL 11.9(L)  Hematocrit 36.0 - 46.0 % 36.9  Platelets 150 - 400 K/uL 67(L)    US Pelvic Complete With Transvaginal  Result Date: 02/01/2018 CLINICAL DATA:  Vaginal bleeding for 3 days EXAM: TRANSABDOMINAL AND TRANSVAGINAL ULTRASOUND OF PELVIS TECHNIQUE: Both transabdominal and transvaginal ultrasound examinations of the  pelvis were performed.  Transabdominal technique was performed for global imaging of the pelvis including uterus, ovaries, adnexal regions, and pelvic cul-de-sac. It was necessary to proceed with endovaginal exam following the transabdominal exam to visualize the uterus, endometrium, and ovaries. Transvaginal imaging was limited by patient discomfort. COMPARISON:  None FINDINGS: Uterus Measurements: 5.9 x 3.3 x 3.7 cm. Retroverted. Normal morphology without mass Endometrium Thickness: 16 mm. Abnormal appearing heterogeneous endometrial complex. No focal mass lesion delineated. Right ovary Not visualized on either transabdominal or endovaginal imaging, likely obscured by bowel Left ovary Not visualized on either transabdominal or endovaginal imaging, likely obscured by bowel Other findings No abnormal free fluid. IMPRESSION: Nonvisualization of ovaries. Abnormal appearing heterogeneous endometrial complex 16 mm thick; if bleeding remains unresponsive to hormonal or medical therapy, focal lesion work-up with sonohysterogram should be considered. Endometrial biopsy should also be considered in pre-menopausal patients at high risk for endometrial carcinoma. (Ref: Radiological Reasoning: Algorithmic Workup of Abnormal Vaginal Bleeding with Endovaginal Sonography and Sonohysterography. AJR 2008; 338:V29-19) Electronically Signed   By: Lavonia Dana M.D.   On: 02/01/2018 16:36    Assessment and plan-  Patient is a 78 y.o. female with history of chronic thrombocytopenia, leukopenia, splenomegaly, declines bone marrow biopsy for work-up presented to emergency room for vaginal bleeding .  #Thrombocytopenia, she has previously underwent extensive noninvasive testing including normal B12 and folate level, negative hepatitis panel, HIV was done in March 2019 was negative.  Negative ANA.  Normal LDH.  Smear review showed unremarkable WBC morphology.  Thrombocytopenia with unremarkable platelet morphology.  Her copper level  was reduced at 18 and has been advised to start copper supplements Sister tells me that patient now is willing to consider bone marrow biopsy. Will further discuss outpatient.   # Ultrasound also showed splenomegaly, dilated portal vein consistent with portal vein hypertension. Will refer her again to [facility] for further evaluation.   #Vaginal bleeding, endometrial thickened.  s/p D&C, awaiting biopsy.   Patient can follow up with me outpatient and further discuss bone marrow biopsy.  From hematology aspect, she is ok to be discharged home with outpatient follow up.   Thank you for allowing me to participate in the care of this patient.   Earlie Server, MD, PhD Hematology Oncology [facility] Pager- [phone] 02/04/2018

## 2018-02-04 NOTE — Transfer of Care (Signed)
Immediate Anesthesia Transfer of Care Note  Patient: Cassidy [name]  Procedure(s) Performed: DILATATION & CURETTAGE/HYSTEROSCOPY WITH MYOSURE (N/A Vagina )  Patient Location: PACU  Anesthesia Type:General  Level of Consciousness: drowsy  Airway & Oxygen Therapy: Patient Spontanous Breathing and Patient connected to face mask oxygen  Post-op Assessment: Report given to RN and Post -op Vital signs reviewed and stable  Post vital signs: Reviewed and stable  Last Vitals:  Vitals Value Taken Time  BP    Temp    Pulse 95 02/04/2018  1:14 PM  Resp 17 02/04/2018  1:14 PM  SpO2 100 % 02/04/2018  1:14 PM  Vitals shown include unvalidated device data.  Last Pain:  Vitals:   02/04/18 1219  TempSrc: Tympanic  PainSc:          Complications: No apparent anesthesia complications

## 2018-02-04 NOTE — Care Management (Signed)
Patient to have D & C and hysteroscopy . Will receive unit of platelets prior to procedure

## 2018-02-05 ENCOUNTER — Encounter: Payer: Self-pay | Admitting: Obstetrics & Gynecology

## 2018-02-05 LAB — BPAM PLATELET PHERESIS
BLOOD PRODUCT EXPIRATION DATE: 201910182359
ISSUE DATE / TIME: 201910171105
UNIT TYPE AND RH: 5100

## 2018-02-05 LAB — PREPARE PLATELET PHERESIS: Unit division: 0

## 2018-02-05 MED ORDER — COPPER GLUCONATE 2 MG PO TABS: 1.0000 | ORAL_TABLET | Freq: Every day | ORAL | 0 refills | Status: DC

## 2018-02-05 MED ORDER — ASCORBIC ACID 250 MG PO TABS: 250.0000 mg | ORAL_TABLET | Freq: Two times a day (BID) | ORAL | 0 refills | Status: DC

## 2018-02-05 NOTE — Anesthesia Postprocedure Evaluation (Signed)
Anesthesia Post Note  Patient: TORYN DEWALT  Procedure(s) Performed: DILATATION & CURETTAGE/HYSTEROSCOPY WITH MYOSURE (N/A Vagina )  Patient location during evaluation: PACU Anesthesia Type: General Level of consciousness: awake and alert and oriented Pain management: pain level controlled Vital Signs Assessment: post-procedure vital signs reviewed and stable Respiratory status: spontaneous breathing, nonlabored ventilation and respiratory function stable Cardiovascular status: blood pressure returned to baseline and stable Postop Assessment: no signs of nausea or vomiting Anesthetic complications: no     Last Vitals:  Vitals:   02/04/18 2019 02/05/18 0305  BP: 135/62 (!) 132/55  Pulse: 95 96  Resp: 17 17  Temp: 36.9 C 36.8 C  SpO2: 94% 94%    Last Pain:  Vitals:   02/05/18 0305  TempSrc: Oral  PainSc:                  Naasia Weilbacher

## 2018-02-05 NOTE — Care Management Important Message (Signed)
Copy of signed IM left with patient in room.  

## 2018-02-05 NOTE — Plan of Care (Signed)
  Problem: Spiritual Needs Goal: Ability to function at adequate level Outcome: Progressing   Problem: Problem: Skin/Wound Progression Goal: Adequate nutrition will be maintained Outcome: Progressing   Problem: Education: Goal: Knowledge of General Education information will improve Description Including pain rating scale, medication(s)/side effects and non-pharmacologic comfort measures Outcome: Progressing   Problem: Clinical Measurements: Goal: Will remain free from infection Outcome: Progressing Goal: Diagnostic test results will improve Outcome: Progressing   Problem: Activity: Goal: Risk for activity intolerance will decrease Outcome: Progressing   Problem: Pain Managment: Goal: General experience of comfort will improve Outcome: Progressing   Problem: Safety: Goal: Ability to remain free from injury will improve Outcome: Progressing   Problem: Skin Integrity: Goal: Risk for impaired skin integrity will decrease Outcome: Progressing

## 2018-02-05 NOTE — Discharge Summary (Signed)
Cassidy [name], is a 77 y.o. female  DOB [birth year]  MRN [id number].  Admission date:  02/01/2018  Admitting Physician  Demetrios Loll, MD  Discharge Date:  02/05/2018   Primary MD  Kirk Ruths, MD  Recommendations for primary care physician for things to follow:  Follow-up with PCP in 1 week Follow-up with Dr. Tasia Catchings from oncology 10 days   Admission Diagnosis  Thrombocytopenia ([place]) [D69.6] Abnormal vaginal bleeding [N93.9] Anemia, unspecified type [D64.9]   Discharge Diagnosis  Thrombocytopenia ([place]) [D69.6] Abnormal vaginal bleeding [N93.9] Anemia, unspecified type [D64.9]    Active Problems:   Anemia   Symptomatic anemia   Protein-calorie malnutrition, severe   Abnormal vaginal bleeding      Past Medical History:  Diagnosis Date  . Anxiety   . Depression   . Fatty liver   . GERD (gastroesophageal reflux disease)   . Hypothyroidism   . Obesity   . Thyroid disease     Past Surgical History:  Procedure Laterality Date  . APPENDECTOMY    . ESOPHAGOGASTRODUODENOSCOPY (EGD) WITH PROPOFOL N/A 12/20/2014   Procedure: ESOPHAGOGASTRODUODENOSCOPY (EGD) WITH PROPOFOL;  Surgeon: Manya Silvas, MD;  Location: [facility] ENDOSCOPY;  Service: Endoscopy;  Laterality: N/A;  . SAVORY DILATION N/A 12/20/2014   Procedure: SAVORY DILATION;  Surgeon: Manya Silvas, MD;  Location: [facility] ENDOSCOPY;  Service: Endoscopy;  Laterality: N/A;  . TONSILLECTOMY         History of present illness and  Hospital Course:     Kindly see H&P for history of present illness and admission details, please review complete Labs, Consult reports and Test reports for all details in brief  HPI  from the history and physical done on the day of admission 77 year old female patient with history of chronic thrombocytopenia, splenomegaly, follows  with Dr. Tasia Catchings comes in with vaginal bleeding for 3 days before admission.  Has underlying history of hypothyroidism, anxiety, depression.   Hospital Course  #1/vaginal bleeding likely due to thrombocytopenia, seen by Dr. Larey Days from OB/GYN patient had dilatation and curettage in the OR yesterday and patient D&C did not show any abnormalities like malignancies, patient received 2 units platelet transfusion before D&C.  #2 chronic thrombocytopenia, followed by Dr. Tasia Catchings, extensive blood work is done including B12, folate, negative hepatitis panel, HIV, ANA, peripheral smear.  Patient has history of splenomegaly which can contribute to thrombocytopenia, platelets are stable around 67.  She can follow-up with Dr. Tasia Catchings as an outpatient.  Patient is seen by Dr. you while inpatient. 3.  Major depression, anxiety: Continue Klonopin, BuSpar 4.  Hypothyroidism: Continue Synthyroid. History of chronic hepatic encephalopathy: Patient is on rifaximin. 5.  Severe malnutrition in the context of social and environmental circumstances, patient can eat regular diet, started on Magic cups, vitamin C, copper, Megace.  Patient supposed to be on copper 2 mg p.o. daily which we started yesterday .  Discharge Condition: Stable   Follow UP  Follow-up Information    Kirk Ruths, MD. Go on 02/11/2018.   Specialty:  Internal Medicine Why:  @ 3:30pm Contact information: [place] [address] [phone]        Earlie Server, MD. Go on 02/16/2018.   Specialty:  Oncology Why:  @ 1:30pm Contact information: [place] [address] [phone]             Discharge Instructions  and  Discharge Medications      Allergies as of 02/05/2018  Reactions   Sulfa Antibiotics Shortness Of Breath      Medication List    TAKE these medications   acidophilus Caps capsule Take 1 capsule by mouth 2 (two) times daily.   busPIRone 15 MG tablet Commonly known  as:  BUSPAR Take 1 tablet (15 mg total) by mouth at bedtime.   clonazePAM 0.5 MG tablet Commonly known as:  KLONOPIN Take 1 tablet (0.5 mg total) by mouth daily as needed for anxiety. Only for severe anxiety sx   escitalopram 10 MG tablet Commonly known as:  LEXAPRO Take 1 tablet (10 mg total) by mouth daily.   fluticasone 50 MCG/ACT nasal spray Commonly known as:  FLONASE Place 2 sprays into both nostrils daily.   levothyroxine 100 MCG tablet Commonly known as:  SYNTHROID, LEVOTHROID Take 1 tablet (100 mcg total) by mouth daily before breakfast.   loperamide 2 MG capsule Commonly known as:  IMODIUM Take 2 mg by mouth as needed for diarrhea or loose stools.   megestrol 20 MG tablet Commonly known as:  MEGACE Take 1 tablet (20 mg total) by mouth 2 (two) times daily.   Melatonin 1 MG Tabs Take 5 mg by mouth at bedtime as needed.   omeprazole 40 MG capsule Commonly known as:  PRILOSEC Take 1 capsule (40 mg total) by mouth daily.   ondansetron 4 MG disintegrating tablet Commonly known as:  ZOFRAN-ODT Take 1 tablet (4 mg total) by mouth every 8 (eight) hours as needed for nausea or vomiting.   potassium chloride 10 MEQ tablet Commonly known as:  K-DUR Take 10 mEq by mouth daily.   QUEtiapine 100 MG tablet Commonly known as:  SEROQUEL Take 1 tablet (100 mg total) by mouth at bedtime.   traZODone 150 MG tablet Commonly known as:  DESYREL Take 1 tablet (150 mg total) by mouth at bedtime.   traZODone 50 MG tablet Commonly known as:  DESYREL Take 1 tablet (50 mg total) by mouth at bedtime as needed for sleep. To be combined with 150 mg as needed   XIFAXAN 550 MG Tabs tablet Generic drug:  rifaximin Take 1 tablet by mouth 2 (two) times daily.         Diet and Activity recommendation: See Discharge Instructions above   Consults obtained -oncology, OB/GYN   Major procedures and Radiology Reports - PLEASE review detailed and final reports for all details, in  brief -     US Pelvic Complete With Transvaginal  Result Date: 02/01/2018 CLINICAL DATA:  Vaginal bleeding for 3 days EXAM: TRANSABDOMINAL AND TRANSVAGINAL ULTRASOUND OF PELVIS TECHNIQUE: Both transabdominal and transvaginal ultrasound examinations of the pelvis were performed. Transabdominal technique was performed for global imaging of the pelvis including uterus, ovaries, adnexal regions, and pelvic cul-de-sac. It was necessary to proceed with endovaginal exam following the transabdominal exam to visualize the uterus, endometrium, and ovaries. Transvaginal imaging was limited by patient discomfort. COMPARISON:  None FINDINGS: Uterus Measurements: 5.9 x 3.3 x 3.7 cm. Retroverted. Normal morphology without mass Endometrium Thickness: 16 mm. Abnormal appearing heterogeneous endometrial complex. No focal mass lesion delineated. Right ovary Not visualized on either transabdominal or endovaginal imaging, likely obscured by bowel Left ovary Not visualized on either transabdominal or endovaginal imaging, likely obscured by bowel Other findings No abnormal free fluid. IMPRESSION: Nonvisualization of ovaries. Abnormal appearing heterogeneous endometrial complex 16 mm thick; if bleeding remains unresponsive to hormonal or medical therapy, focal lesion work-up with sonohysterogram should be considered. Endometrial biopsy should also be considered in  pre-menopausal patients at high risk for endometrial carcinoma. (Ref: Radiological Reasoning: Algorithmic Workup of Abnormal Vaginal Bleeding with Endovaginal Sonography and Sonohysterography. AJR 2008; [id number]) Electronically Signed   By: Lavonia Dana M.D.   On: 02/01/2018 16:36    Micro Results     No results found for this or any previous visit (from the past 240 hour(s)).  Patient lives with his grandson who he is an EMT.  But patient sisters are also caregivers and if they want we can provide home health PT, [place].   Today   Subjective:   Therisa Doyne  today feels well, decreased vaginal bleeding, stable for discharge  Objective:   Blood pressure (!) 132/55, pulse 96, temperature 98.2 F (36.8 C), temperature source Oral, resp. rate 17, height 5' 1"  (1.549 m), weight 56.9 kg, SpO2 94 %.   Intake/Output Summary (Last 24 hours) at 02/05/2018 1028 Last data filed at 02/05/2018 0500 Gross per 24 hour  Intake 958 ml  Output 26 ml  Net 932 ml    Exam Awake Alert, Oriented x 3, No new F.N deficits, Normal affect [place].AT,PERRAL Supple Neck,No JVD, No cervical lymphadenopathy appriciated.  Symmetrical Chest wall movement, Good air movement bilaterally, CTAB RRR,No Gallops,Rubs or new Murmurs, No Parasternal Heave +ve B.Sounds, Abd Soft, Non tender, No organomegaly appriciated, No rebound -guarding or rigidity. No Cyanosis, Clubbing or edema, No new Rash or bruise  Data Review   CBC w Diff:  Lab Results  Component Value Date   WBC 2.6 (L) 02/04/2018   HGB 11.9 (L) 02/04/2018   HGB 15.3 09/21/2013   HCT 36.9 02/04/2018   HCT 46.7 09/21/2013   PLT 67 (L) 02/04/2018   PLT 67 (L) 09/21/2013   LYMPHOPCT 23 02/01/2018   LYMPHOPCT 19.4 08/22/2013   MONOPCT 9 02/01/2018   MONOPCT 5 09/21/2013   MONOPCT 9.7 08/22/2013   EOSPCT 2 02/01/2018   EOSPCT 3.3 08/22/2013   BASOPCT 0 02/01/2018   BASOPCT 0.9 08/22/2013    CMP:  Lab Results  Component Value Date   NA 138 02/01/2018   NA 137 08/22/2013   K 3.8 02/01/2018   K 3.6 08/22/2013   CL 107 02/01/2018   CL 103 08/22/2013   CO2 22 02/01/2018   CO2 27 08/22/2013   BUN 10 02/01/2018   BUN 11 08/22/2013   CREATININE 0.69 02/01/2018   CREATININE 0.96 08/22/2013   PROT 6.2 (L) 02/01/2018   PROT 7.6 08/21/2013   ALBUMIN 3.3 (L) 02/01/2018   ALBUMIN 3.8 08/21/2013   BILITOT 1.4 (H) 02/01/2018   BILITOT 0.5 08/21/2013   ALKPHOS 70 02/01/2018   ALKPHOS 95 08/21/2013   AST 35 02/01/2018   AST 44 (H) 08/21/2013   ALT 16 02/01/2018   ALT 32 08/21/2013  .   Total Time in  preparing paper work, data evaluation and todays exam - 35 minutes  Epifanio Lesches M.D on 02/05/2018 at 10:28 AM    Note: This dictation was prepared with Dragon dictation along with smaller phrase technology. Any transcriptional errors that result from this process are unintentional.

## 2018-02-06 LAB — SURGICAL PATHOLOGY

## 2018-02-09 ENCOUNTER — Emergency Department: Payer: Medicare Other

## 2018-02-09 ENCOUNTER — Observation Stay: Payer: Medicare Other

## 2018-02-09 ENCOUNTER — Encounter: Payer: Self-pay | Admitting: Emergency Medicine

## 2018-02-09 ENCOUNTER — Observation Stay
Admission: EM | Admit: 2018-02-09 | Discharge: 2018-02-09 | Disposition: A | Discharge status: Home / Self Care | Payer: Medicare Other | Attending: Internal Medicine | Admitting: Internal Medicine

## 2018-02-09 ENCOUNTER — Other Ambulatory Visit: Payer: Self-pay

## 2018-02-09 DIAGNOSIS — K7460 Unspecified cirrhosis of liver: Secondary | ICD-10-CM | POA: Insufficient documentation

## 2018-02-09 DIAGNOSIS — Z6823 Body mass index (BMI) 23.0-23.9, adult: Secondary | ICD-10-CM | POA: Insufficient documentation

## 2018-02-09 DIAGNOSIS — E43 Unspecified severe protein-calorie malnutrition: Secondary | ICD-10-CM | POA: Insufficient documentation

## 2018-02-09 DIAGNOSIS — D735 Infarction of spleen: Secondary | ICD-10-CM | POA: Insufficient documentation

## 2018-02-09 DIAGNOSIS — Z79818 Long term (current) use of other agents affecting estrogen receptors and estrogen levels: Secondary | ICD-10-CM | POA: Insufficient documentation

## 2018-02-09 DIAGNOSIS — F329 Major depressive disorder, single episode, unspecified: Secondary | ICD-10-CM | POA: Diagnosis not present

## 2018-02-09 DIAGNOSIS — J90 Pleural effusion, not elsewhere classified: Secondary | ICD-10-CM | POA: Diagnosis not present

## 2018-02-09 DIAGNOSIS — D696 Thrombocytopenia, unspecified: Secondary | ICD-10-CM | POA: Insufficient documentation

## 2018-02-09 DIAGNOSIS — R0602 Shortness of breath: Secondary | ICD-10-CM

## 2018-02-09 DIAGNOSIS — J939 Pneumothorax, unspecified: Secondary | ICD-10-CM

## 2018-02-09 DIAGNOSIS — I82890 Acute embolism and thrombosis of other specified veins: Secondary | ICD-10-CM | POA: Insufficient documentation

## 2018-02-09 DIAGNOSIS — F419 Anxiety disorder, unspecified: Secondary | ICD-10-CM | POA: Insufficient documentation

## 2018-02-09 DIAGNOSIS — N939 Abnormal uterine and vaginal bleeding, unspecified: Secondary | ICD-10-CM | POA: Insufficient documentation

## 2018-02-09 DIAGNOSIS — K219 Gastro-esophageal reflux disease without esophagitis: Secondary | ICD-10-CM | POA: Insufficient documentation

## 2018-02-09 DIAGNOSIS — E039 Hypothyroidism, unspecified: Secondary | ICD-10-CM | POA: Diagnosis not present

## 2018-02-09 DIAGNOSIS — Z79890 Hormone replacement therapy (postmenopausal): Secondary | ICD-10-CM | POA: Diagnosis not present

## 2018-02-09 DIAGNOSIS — Z79899 Other long term (current) drug therapy: Secondary | ICD-10-CM | POA: Diagnosis not present

## 2018-02-09 DIAGNOSIS — Z9889 Other specified postprocedural states: Secondary | ICD-10-CM

## 2018-02-09 LAB — CBC WITH DIFFERENTIAL/PLATELET
ABS IMMATURE GRANULOCYTES: 0.01 10*3/uL (ref 0.00–0.07)
BASOS ABS: 0 10*3/uL (ref 0.0–0.1)
BASOS PCT: 1 %
Eosinophils Absolute: 0.1 10*3/uL (ref 0.0–0.5)
Eosinophils Relative: 3 %
HCT: 38.9 % (ref 36.0–46.0)
Hemoglobin: 12.6 g/dL (ref 12.0–15.0)
IMMATURE GRANULOCYTES: 0 %
Lymphocytes Relative: 17 %
Lymphs Abs: 0.6 10*3/uL — ABNORMAL LOW (ref 0.7–4.0)
MCH: 29.9 pg (ref 26.0–34.0)
MCHC: 32.4 g/dL (ref 30.0–36.0)
MCV: 92.2 fL (ref 80.0–100.0)
Monocytes Absolute: 0.3 10*3/uL (ref 0.1–1.0)
Monocytes Relative: 9 %
NEUTROS PCT: 70 %
NRBC: 0 % (ref 0.0–0.2)
Neutro Abs: 2.4 10*3/uL (ref 1.7–7.7)
PLATELETS: 68 10*3/uL — AB (ref 150–400)
RBC: 4.22 MIL/uL (ref 3.87–5.11)
RDW: 17.2 % — ABNORMAL HIGH (ref 11.5–15.5)
WBC: 3.4 10*3/uL — AB (ref 4.0–10.5)

## 2018-02-09 LAB — COMPREHENSIVE METABOLIC PANEL
ALBUMIN: 3.4 g/dL — AB (ref 3.5–5.0)
ALT: 19 U/L (ref 0–44)
AST: 32 U/L (ref 15–41)
Alkaline Phosphatase: 82 U/L (ref 38–126)
Anion gap: 8 (ref 5–15)
BUN: 10 mg/dL (ref 8–23)
CHLORIDE: 109 mmol/L (ref 98–111)
CO2: 24 mmol/L (ref 22–32)
CREATININE: 0.68 mg/dL (ref 0.44–1.00)
Calcium: 8.5 mg/dL — ABNORMAL LOW (ref 8.9–10.3)
GFR calc Af Amer: >60 mL/min (ref >60)
GFR calc non Af Amer: >60 mL/min (ref >60)
GLUCOSE: 112 mg/dL — AB (ref 70–99)
Potassium: 3.8 mmol/L (ref 3.5–5.1)
SODIUM: 141 mmol/L (ref 135–145)
Total Bilirubin: 1.5 mg/dL — ABNORMAL HIGH (ref 0.3–1.2)
Total Protein: 6.4 g/dL — ABNORMAL LOW (ref 6.5–8.1)

## 2018-02-09 LAB — PROTIME-INR
INR: 1.39
PROTHROMBIN TIME: 16.9 s — AB (ref 11.4–15.2)

## 2018-02-09 LAB — TROPONIN I: Troponin I: <0.03 ng/mL (ref <0.03)

## 2018-02-09 LAB — ABO/RH: ABO/RH(D): O POS

## 2018-02-09 LAB — BRAIN NATRIURETIC PEPTIDE: B Natriuretic Peptide: 69 pg/mL (ref 0.0–100.0)

## 2018-02-09 LAB — PLATELET COUNT: Platelets: 88 10*3/uL — ABNORMAL LOW (ref 150–400)

## 2018-02-09 MED ORDER — ONDANSETRON HCL 4 MG/2ML IJ SOLN: 4.0000 mg | Freq: Four times a day (QID) | INTRAMUSCULAR | Status: DC | PRN

## 2018-02-09 MED ORDER — POTASSIUM CHLORIDE CRYS ER 10 MEQ PO TBCR
10.0000 meq | EXTENDED_RELEASE_TABLET | Freq: Every day | ORAL | Status: DC
  Administered 2018-02-09 – 2018-02-11 (×3): 10 meq via ORAL
  Filled 2018-02-09 (×3): qty 1

## 2018-02-09 MED ORDER — DIPHENHYDRAMINE HCL 25 MG PO CAPS
25.0000 mg | ORAL_CAPSULE | Freq: Once | ORAL | Status: AC
  Administered 2018-02-09: 25 mg via ORAL
  Filled 2018-02-09: qty 1

## 2018-02-09 MED ORDER — ALBUTEROL SULFATE (2.5 MG/3ML) 0.083% IN NEBU: 5.0000 mg | INHALATION_SOLUTION | Freq: Once | RESPIRATORY_TRACT | Status: DC

## 2018-02-09 MED ORDER — TRAZODONE HCL 50 MG PO TABS: 50.0000 mg | ORAL_TABLET | Freq: Every evening | ORAL | Status: DC | PRN

## 2018-02-09 MED ORDER — PANTOPRAZOLE SODIUM 40 MG PO TBEC
40.0000 mg | DELAYED_RELEASE_TABLET | Freq: Every day | ORAL | Status: DC
  Administered 2018-02-09 – 2018-02-11 (×3): 40 mg via ORAL
  Filled 2018-02-09 (×3): qty 1

## 2018-02-09 MED ORDER — BUSPIRONE HCL 15 MG PO TABS
15.0000 mg | ORAL_TABLET | Freq: Every day | ORAL | Status: DC
  Administered 2018-02-09 – 2018-02-10 (×2): 15 mg via ORAL
  Filled 2018-02-09 (×3): qty 1

## 2018-02-09 MED ORDER — HYDROCODONE-ACETAMINOPHEN 5-325 MG PO TABS
1.0000 | ORAL_TABLET | ORAL | Status: DC | PRN
  Administered 2018-02-09 – 2018-02-10 (×4): 1 via ORAL
  Filled 2018-02-09 (×4): qty 1

## 2018-02-09 MED ORDER — LEVOTHYROXINE SODIUM 100 MCG PO TABS
100.0000 ug | ORAL_TABLET | Freq: Every day | ORAL | Status: DC
  Administered 2018-02-10 – 2018-02-11 (×2): 100 ug via ORAL
  Filled 2018-02-09 (×2): qty 1

## 2018-02-09 MED ORDER — ACETAMINOPHEN 325 MG PO TABS: 650.0000 mg | ORAL_TABLET | Freq: Four times a day (QID) | ORAL | Status: DC | PRN

## 2018-02-09 MED ORDER — ESCITALOPRAM OXALATE 10 MG PO TABS
10.0000 mg | ORAL_TABLET | Freq: Every day | ORAL | Status: DC
  Administered 2018-02-09 – 2018-02-11 (×3): 10 mg via ORAL
  Filled 2018-02-09 (×4): qty 1

## 2018-02-09 MED ORDER — LORAZEPAM 2 MG/ML IJ SOLN
1.0000 mg | Freq: Once | INTRAMUSCULAR | Status: AC
  Administered 2018-02-09: 1 mg via INTRAVENOUS
  Filled 2018-02-09: qty 1

## 2018-02-09 MED ORDER — VITAMIN C 500 MG PO TABS
250.0000 mg | ORAL_TABLET | Freq: Two times a day (BID) | ORAL | Status: DC
  Administered 2018-02-09 – 2018-02-11 (×4): 250 mg via ORAL
  Filled 2018-02-09 (×4): qty 1

## 2018-02-09 MED ORDER — QUETIAPINE FUMARATE 25 MG PO TABS
100.0000 mg | ORAL_TABLET | Freq: Every day | ORAL | Status: DC
  Administered 2018-02-09 – 2018-02-10 (×2): 100 mg via ORAL
  Filled 2018-02-09 (×2): qty 4

## 2018-02-09 MED ORDER — MEGESTROL ACETATE 20 MG PO TABS
20.0000 mg | ORAL_TABLET | Freq: Two times a day (BID) | ORAL | Status: DC
  Administered 2018-02-09 – 2018-02-11 (×4): 20 mg via ORAL
  Filled 2018-02-09 (×6): qty 1

## 2018-02-09 MED ORDER — MELATONIN 5 MG PO TABS
5.0000 mg | ORAL_TABLET | Freq: Every evening | ORAL | Status: DC | PRN
  Filled 2018-02-09: qty 5

## 2018-02-09 MED ORDER — ONDANSETRON HCL 4 MG PO TABS: 4.0000 mg | ORAL_TABLET | Freq: Four times a day (QID) | ORAL | Status: DC | PRN

## 2018-02-09 MED ORDER — RISAQUAD PO CAPS
1.0000 | ORAL_CAPSULE | Freq: Two times a day (BID) | ORAL | Status: DC
  Administered 2018-02-09 – 2018-02-11 (×4): 1 via ORAL
  Filled 2018-02-09 (×4): qty 1

## 2018-02-09 MED ORDER — FUROSEMIDE 10 MG/ML IJ SOLN
20.0000 mg | Freq: Once | INTRAMUSCULAR | Status: AC
  Administered 2018-02-09: 20 mg via INTRAVENOUS
  Filled 2018-02-09: qty 4

## 2018-02-09 MED ORDER — POLYETHYLENE GLYCOL 3350 17 G PO PACK: 17.0000 g | PACK | Freq: Every day | ORAL | Status: DC | PRN

## 2018-02-09 MED ORDER — ALBUTEROL SULFATE (2.5 MG/3ML) 0.083% IN NEBU
2.5000 mg | INHALATION_SOLUTION | Freq: Four times a day (QID) | RESPIRATORY_TRACT | Status: DC
  Administered 2018-02-09 – 2018-02-10 (×3): 2.5 mg via RESPIRATORY_TRACT
  Filled 2018-02-09 (×3): qty 3

## 2018-02-09 MED ORDER — CLONAZEPAM 0.5 MG PO TABS: 0.5000 mg | ORAL_TABLET | Freq: Every day | ORAL | Status: DC | PRN

## 2018-02-09 MED ORDER — ACETAMINOPHEN 650 MG RE SUPP: 650.0000 mg | Freq: Four times a day (QID) | RECTAL | Status: DC | PRN

## 2018-02-09 MED ORDER — RIFAXIMIN 550 MG PO TABS
550.0000 mg | ORAL_TABLET | Freq: Two times a day (BID) | ORAL | Status: DC
  Administered 2018-02-09 – 2018-02-11 (×4): 550 mg via ORAL
  Filled 2018-02-09 (×4): qty 1

## 2018-02-09 MED ORDER — LOPERAMIDE HCL 2 MG PO CAPS: 2.0000 mg | ORAL_CAPSULE | ORAL | Status: DC | PRN

## 2018-02-09 MED ORDER — TRAZODONE HCL 50 MG PO TABS
150.0000 mg | ORAL_TABLET | Freq: Every day | ORAL | Status: DC
  Administered 2018-02-09 – 2018-02-10 (×2): 150 mg via ORAL
  Filled 2018-02-09 (×2): qty 3

## 2018-02-09 MED ORDER — ACETAMINOPHEN 325 MG PO TABS
650.0000 mg | ORAL_TABLET | Freq: Once | ORAL | Status: AC
  Administered 2018-02-09: 650 mg via ORAL
  Filled 2018-02-09: qty 2

## 2018-02-09 MED ORDER — SODIUM CHLORIDE 0.9 % IV BOLUS (SEPSIS)
Freq: Once | INTRAVENOUS | Status: AC
  Administered 2018-02-09: 13:00:00 via INTRAVENOUS

## 2018-02-09 NOTE — Care Management Note (Signed)
Case Management Note  Patient Details  Name: FALLON HAECKER MRN: [id number] Date of Birth: 04/08/1941  Subjective/Objective:   Admitted to [facility] under observation status with the diagnosis of pleural effusion. Chancy Hurter lives in the home. Sister is [name] [phone]). Spoke with other sister Gale [place] at the bedside.  ( Ms Beneke has gone for thoracentesis ).  Seen Dr. Ouida Sills 2 weeks ago. Prescriptions are filled at [facility]. [facility] in the past. No skilled facility. No home oxygen. Rolling walker, wheelchair, cane, raised toilet seat, and grab bars in the home. Self feed. Family helps with baths and dressing. Falls this year. Appetite is getting better,  Family will transport                Action/Plan: Will continue to follow for discharge plans.   Expected Discharge Date:                  Expected Discharge Plan:     In-House Referral:   yes  Discharge planning Services   yes  Post Acute Care Choice:    Choice offered to:     DME Arranged:    DME Agency:     HH Arranged:    HH Agency:     Status of Service:     If discussed at [employer] of Stay Meetings, dates discussed:    Additional Comments:  Shelbie Ammons, RN  MSN CCM Care Management [phone] 02/09/2018, 3:36 PM

## 2018-02-09 NOTE — ED Provider Notes (Signed)
[facility] Emergency Department Provider Note  Time seen: 11:17 AM  I have reviewed the triage vital signs and the nursing notes.   HISTORY  Chief Complaint Shortness of Breath    HPI Cassidy [name] is a 77 y.o. female with a past medical history of anxiety, depression, gastric reflux, hypothyroidism, thrombocytopenia, presents to the emergency department for difficulty breathing.  According to the patient since this morning she has been experiencing significant difficulty breathing.  States no better or worse sitting up or lying down.  Is currently resting in bed and states shortness of breath.  States a dry cough for last few days as well.  Denies any sputum or mucus production.  Denies any fever.  No chest pain or abdominal pain.   Past Medical History:  Diagnosis Date  . Anxiety   . Depression   . Fatty liver   . GERD (gastroesophageal reflux disease)   . Hypothyroidism   . Obesity   . Thyroid disease     Patient Active Problem List   Diagnosis Date Noted  . Protein-calorie malnutrition, severe 02/04/2018  . Abnormal vaginal bleeding   . Symptomatic anemia 02/03/2018  . Anemia 02/01/2018  . Leukopenia 12/01/2017  . Dysphagia 11/21/2017  . Emesis, persistent 11/20/2017  . Severe recurrent major depression without psychotic features ([employer]) 06/19/2016  . Hepatic encephalopathy ([place]) 08/13/2015  . Thrombocytopenia ([place]) 07/23/2015  . Amnesia 03/20/2015  . Major depression in remission ([place]) 03/12/2015  . Gonalgia 01/05/2015  . Absolute anemia 11/27/2014  . Arthritis 11/27/2014  . Acid reflux 11/27/2014  . Depression, major, recurrent, in partial remission ([place]) 11/27/2014  . Encounter for general adult medical examination without abnormal findings 08/25/2014  . Chronic LBP 08/15/2014  . Complete rotator cuff rupture of left shoulder 05/01/2014  . Infraspinatus tenosynovitis 05/01/2014  . Other synovitis and tenosynovitis, right shoulder  05/01/2014  . Difficulty in walking 11/30/2013  . Extreme obesity 11/30/2013  . Morbid obesity ([place]) 11/30/2013  . Arthritis, degenerative 10/01/2013  . Steatohepatitis 10/01/2013  . Orthostasis 10/01/2013  . 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 Fall 09/26/2013  . Dizziness 09/07/2013  . Osteoporosis with fracture 09/06/2013  . Clinical depression 03/25/2012  . Adult hypothyroidism 03/25/2012  . Esophageal stenosis 07/03/2009  . Back ache 03/08/2003  . Anxiety state 12/27/2002    Past Surgical History:  Procedure Laterality Date  . APPENDECTOMY    . DILATATION & CURETTAGE/HYSTEROSCOPY WITH MYOSURE N/A 02/04/2018   Procedure: DILATATION & CURETTAGE/HYSTEROSCOPY WITH MYOSURE;  Surgeon: Ward, Honor Loh, MD;  Location: [facility] ORS;  Service: Gynecology;  Laterality: N/A;  . ESOPHAGOGASTRODUODENOSCOPY (EGD) WITH PROPOFOL N/A 12/20/2014   Procedure: ESOPHAGOGASTRODUODENOSCOPY (EGD) WITH PROPOFOL;  Surgeon: Manya Silvas, MD;  Location: [facility] ENDOSCOPY;  Service: Endoscopy;  Laterality: N/A;  . SAVORY DILATION N/A 12/20/2014   Procedure: SAVORY DILATION;  Surgeon: Manya Silvas, MD;  Location: [facility] ENDOSCOPY;  Service: Endoscopy;  Laterality: N/A;  . TONSILLECTOMY      Prior to Admission medications   Medication Sig Start Date End Date Taking? Authorizing Provider  acidophilus (RISAQUAD) CAPS capsule Take 1 capsule by mouth 2 (two) times daily.    [provider]  busPIRone (BUSPAR) 15 MG tablet Take 1 tablet (15 mg total) by mouth at bedtime. 01/05/18   Ursula Alert, MD  clonazePAM (KLONOPIN) 0.5 MG tablet Take 1 tablet (0.5 mg total) by mouth daily as needed for anxiety. Only for severe anxiety sx 01/05/18   Ursula Alert, MD  Copper  Gluconate 2 MG TABS Take 1 tablet (2 mg total) by mouth daily. 02/06/18   Epifanio Lesches, MD  escitalopram (LEXAPRO) 10 MG tablet Take 1 tablet (10 mg total) by mouth daily. 01/05/18   Ursula Alert, MD  levothyroxine (SYNTHROID,  LEVOTHROID) 100 MCG tablet Take 1 tablet (100 mcg total) by mouth daily before breakfast. 11/25/17   Mayo, Pete Pelt, MD  loperamide (IMODIUM) 2 MG capsule Take 2 mg by mouth as needed for diarrhea or loose stools.    [provider]  megestrol (MEGACE) 20 MG tablet Take 1 tablet (20 mg total) by mouth 2 (two) times daily. 12/18/17   Earlie Server, MD  Melatonin 1 MG TABS Take 5 mg by mouth at bedtime as needed.    [provider]  omeprazole (PRILOSEC) 40 MG capsule Take 1 capsule (40 mg total) by mouth daily. 06/19/17 06/19/18  Earleen Newport, MD  ondansetron (ZOFRAN ODT) 4 MG disintegrating tablet Take 1 tablet (4 mg total) by mouth every 8 (eight) hours as needed for nausea or vomiting. Patient not taking: Reported on 01/14/2018 11/24/17   Mayo, Pete Pelt, MD  potassium chloride (K-DUR) 10 MEQ tablet Take 10 mEq by mouth daily.    [provider]  QUEtiapine (SEROQUEL) 100 MG tablet Take 1 tablet (100 mg total) by mouth at bedtime. 01/05/18   Ursula Alert, MD  traZODone (DESYREL) 150 MG tablet Take 1 tablet (150 mg total) by mouth at bedtime. 01/05/18   Ursula Alert, MD  traZODone (DESYREL) 50 MG tablet Take 1 tablet (50 mg total) by mouth at bedtime as needed for sleep. To be combined with 150 mg as needed 01/05/18   Ursula Alert, MD  vitamin C (VITAMIN C) 250 MG tablet Take 1 tablet (250 mg total) by mouth 2 (two) times daily. 02/05/18   Epifanio Lesches, MD  XIFAXAN 550 MG TABS tablet Take 1 tablet by mouth 2 (two) times daily. 10/21/17   [provider]    Allergies  Allergen Reactions  . Sulfa Antibiotics Shortness Of Breath    Family History  Problem Relation Age of Onset  . Diabetes Mother   . COPD Mother   . Kidney disease Mother   . Depression Mother   . Hypertension Mother   . Heart attack Father   . Aneurysm Father   . Anxiety disorder Sister   . Depression Sister   . Diabetes Sister   . Hypertension Sister   . Atrial fibrillation  Brother   . Spinal muscular atrophy Brother   . Breast cancer Sister   . Bone cancer Sister   . Depression Sister   . Anxiety disorder Sister   . Liver cancer Sister   . Breast cancer Sister   . Colon cancer Sister   . Depression Sister   . Diabetes Sister   . COPD Sister   . Depression Sister   . Anxiety disorder Sister   . Skin cancer Sister   . Diabetes Brother   . Depression Brother   . Skin cancer Maternal Aunt   . Diabetes Maternal Aunt   . Cervical cancer Paternal Aunt     Social History Social History   Tobacco Use  . Smoking status: Never Smoker  . Smokeless tobacco: Never Used  Substance Use Topics  . Alcohol use: No  . Drug use: No    Review of Systems Constitutional: Negative for fever. Eyes: Negative for visual complaints ENT: Negative for recent illness/congestion Cardiovascular: Negative for chest  pain. Respiratory: Positive for shortness of breath Gastrointestinal: Negative for abdominal pain, vomiting Musculoskeletal: Negative for leg pain Skin: Negative for skin complaints  Neurological: Negative for headache All other ROS negative  ____________________________________________   PHYSICAL EXAM:  VITAL SIGNS: ED Triage Vitals [02/09/18 1017]  Enc Vitals Group     BP (!) 156/75     Pulse Rate (!) 101     Resp 18     Temp 98.4 F (36.9 C)     Temp Source Oral     SpO2 93 %     Weight 130 lb (59 kg)     Height 5' 2"  (1.575 m)     Head Circumference      Peak Flow      Pain Score 0     Pain Loc      Pain Edu?      Excl. in [place]?    Constitutional: Alert and oriented. Well appearing and in no distress. Eyes: Normal exam ENT   Head: Normocephalic and atraumatic.   Mouth/Throat: Mucous membranes are moist. Cardiovascular: Normal rate, regular rhythm. No murmur Respiratory: Normal respiratory effort without tachypnea nor retractions. Breath sounds are clear  Gastrointestinal: Soft and nontender. No distention.  Musculoskeletal:  Nontender with normal range of motion in all extremities. No lower extremity tenderness  Neurologic:  Normal speech and language. No gross focal neurologic deficits  Skin:  Skin is warm, dry and intact.  Psychiatric: Mood and affect are normal.   ____________________________________________    EKG  EKG reviewed and interpreted by myself shows sinus rhythm at 97 bpm with a narrow QRS, normal axis, largely normal intervals with nonspecific ST changes.  ____________________________________________    RADIOLOGY  Chest x-ray shows a 75% right-sided pleural effusion.  ____________________________________________   INITIAL IMPRESSION / ASSESSMENT AND PLAN / ED COURSE  Pertinent labs & imaging results that were available during my care of the patient were reviewed by me and considered in my medical decision making (see chart for details).  Patient presents emergency department for shortness of breath since this morning along with a dry cough of the past several days.  States shortness of breath at rest or with exertion.  We will check labs, chest x-ray and continue to closely monitor.  I reviewed the patient's record she was discharged from the hospital 4 days ago after an admission for vaginal bleeding thought to be due to thrombocytopenia, was taken for [facility] by Dr. Leonides Schanz.  Continues to state mild vaginal bleeding.  Patient's chest x-ray shows a 75% opacification of the right hemithorax likely due to her pleural effusion.  This is very likely the cause of the patient's shortness of breath.  We will check labs and likely proceed with admission for further management and drainage.  Labs are largely at baseline platelet count of 68,000, largely unchanged from 67,000 upon discharge.  Albumin is 3.4.  Patient will be admitted to the hospital service for further work-up/management possible drainage.   ____________________________________________   FINAL CLINICAL IMPRESSION(S) / ED  DIAGNOSES  Dyspnea Pleural effusion    Harvest Dark, MD 02/09/18 1201

## 2018-02-09 NOTE — ED Notes (Signed)
First Nurse Note: patient complaining of difficulty breathing.  Speech clear, speaking in full sentences.

## 2018-02-09 NOTE — ED Notes (Signed)
Report called to 1C VSS. NAD.

## 2018-02-09 NOTE — ED Notes (Signed)
Patient to Rm 13 via WC, Lorrie RN aware of room placement.

## 2018-02-09 NOTE — H&P (Signed)
[facility] NAME: Cassidy [name]    MR#:  [id number]  DATE OF BIRTH:  [birth year]  DATE OF ADMISSION:  02/09/2018  PRIMARY CARE PHYSICIAN: Kirk Ruths, MD   REQUESTING/REFERRING PHYSICIAN:   CHIEF COMPLAINT:   Chief Complaint  Patient presents with  . Shortness of Breath    HISTORY OF PRESENT ILLNESS: Cassidy [name]  is a 77 y.o. 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 Marjory Sneddon, presents today with acute shortness of breath, work-up in the emergency room noted for platelet count of 68-stable compared to previous, chest x-ray noted for large right pleural effusion, patient evaluated bedside, family present, patient does have some chronic mild memory deficits but understands her current condition, patient is now being admitted for acute right pleural effusion.  PAST MEDICAL HISTORY:   Past Medical History:  Diagnosis Date  . Anxiety   . Depression   . Fatty liver   . GERD (gastroesophageal reflux disease)   . Hypothyroidism   . Obesity   . Thyroid disease     PAST SURGICAL HISTORY:  Past Surgical History:  Procedure Laterality Date  . APPENDECTOMY    . DILATATION & CURETTAGE/HYSTEROSCOPY WITH MYOSURE N/A 02/04/2018   Procedure: DILATATION & CURETTAGE/HYSTEROSCOPY WITH MYOSURE;  Surgeon: Ward, Honor Loh, MD;  Location: [facility] ORS;  Service: Gynecology;  Laterality: N/A;  . ESOPHAGOGASTRODUODENOSCOPY (EGD) WITH PROPOFOL N/A 12/20/2014   Procedure: ESOPHAGOGASTRODUODENOSCOPY (EGD) WITH PROPOFOL;  Surgeon: Manya Silvas, MD;  Location: [facility] ENDOSCOPY;  Service: Endoscopy;  Laterality: N/A;  . SAVORY DILATION N/A 12/20/2014   Procedure: SAVORY DILATION;  Surgeon: Manya Silvas, MD;  Location: [facility] ENDOSCOPY;  Service: Endoscopy;   Laterality: N/A;  . TONSILLECTOMY      SOCIAL HISTORY:  Social History   Tobacco Use  . Smoking status: Never Smoker  . Smokeless tobacco: Never Used  Substance Use Topics  . Alcohol use: No    FAMILY HISTORY:  Family History  Problem Relation Age of Onset  . Diabetes Mother   . COPD Mother   . Kidney disease Mother   . Depression Mother   . Hypertension Mother   . Heart attack Father   . Aneurysm Father   . Anxiety disorder Sister   . Depression Sister   . Diabetes Sister   . Hypertension Sister   . Atrial fibrillation Brother   . Spinal muscular atrophy Brother   . Breast cancer Sister   . Bone cancer Sister   . Depression Sister   . Anxiety disorder Sister   . Liver cancer Sister   . Breast cancer Sister   . Colon cancer Sister   . Depression Sister   . Diabetes Sister   . COPD Sister   . Depression Sister   . Anxiety disorder Sister   . Skin cancer Sister   . Diabetes Brother   . Depression Brother   . Skin cancer Maternal Aunt   . Diabetes Maternal Aunt   . Cervical cancer Paternal Aunt     DRUG ALLERGIES:  Allergies  Allergen Reactions  . Sulfa Antibiotics Shortness Of Breath    REVIEW OF SYSTEMS:   CONSTITUTIONAL: No fever, fatigue or weakness.  EYES: No blurred or double vision.  EARS, NOSE, AND THROAT: No tinnitus or ear pain.  RESPIRATORY:  No cough, +shortness of breath, no wheezing or hemoptysis.  CARDIOVASCULAR: No chest pain, orthopnea, edema.  GASTROINTESTINAL: No nausea, vomiting, diarrhea or abdominal pain.  GENITOURINARY: No dysuria, hematuria.  ENDOCRINE: No polyuria, nocturia,  HEMATOLOGY: No anemia, easy bruising or bleeding SKIN: No rash or lesion. MUSCULOSKELETAL: No joint pain or arthritis.   NEUROLOGIC: No tingling, numbness, weakness.  PSYCHIATRY: No anxiety or depression.   MEDICATIONS AT HOME:  Prior to Admission medications   Medication Sig Start Date End Date Taking? Authorizing Provider  acidophilus (RISAQUAD)  CAPS capsule Take 1 capsule by mouth 2 (two) times daily.    [provider]  busPIRone (BUSPAR) 15 MG tablet Take 1 tablet (15 mg total) by mouth at bedtime. 01/05/18   Ursula Alert, MD  clonazePAM (KLONOPIN) 0.5 MG tablet Take 1 tablet (0.5 mg total) by mouth daily as needed for anxiety. Only for severe anxiety sx 01/05/18   Ursula Alert, MD  Copper Gluconate 2 MG TABS Take 1 tablet (2 mg total) by mouth daily. 02/06/18   Epifanio Lesches, MD  escitalopram (LEXAPRO) 10 MG tablet Take 1 tablet (10 mg total) by mouth daily. 01/05/18   Ursula Alert, MD  levothyroxine (SYNTHROID, LEVOTHROID) 100 MCG tablet Take 1 tablet (100 mcg total) by mouth daily before breakfast. 11/25/17   Mayo, Pete Pelt, MD  loperamide (IMODIUM) 2 MG capsule Take 2 mg by mouth as needed for diarrhea or loose stools.    [provider]  megestrol (MEGACE) 20 MG tablet Take 1 tablet (20 mg total) by mouth 2 (two) times daily. 12/18/17   Earlie Server, MD  Melatonin 1 MG TABS Take 5 mg by mouth at bedtime as needed.    [provider]  omeprazole (PRILOSEC) 40 MG capsule Take 1 capsule (40 mg total) by mouth daily. 06/19/17 06/19/18  Earleen Newport, MD  potassium chloride (K-DUR) 10 MEQ tablet Take 10 mEq by mouth daily.    [provider]  QUEtiapine (SEROQUEL) 100 MG tablet Take 1 tablet (100 mg total) by mouth at bedtime. 01/05/18   Ursula Alert, MD  traZODone (DESYREL) 150 MG tablet Take 1 tablet (150 mg total) by mouth at bedtime. 01/05/18   Ursula Alert, MD  traZODone (DESYREL) 50 MG tablet Take 1 tablet (50 mg total) by mouth at bedtime as needed for sleep. To be combined with 150 mg as needed 01/05/18   Ursula Alert, MD  vitamin C (VITAMIN C) 250 MG tablet Take 1 tablet (250 mg total) by mouth 2 (two) times daily. 02/05/18   Epifanio Lesches, MD  XIFAXAN 550 MG TABS tablet Take 1 tablet by mouth 2 (two) times daily. 10/21/17   [provider]      PHYSICAL  EXAMINATION:   VITAL SIGNS: Blood pressure (!) 156/75, pulse (!) 101, temperature 98.4 F (36.9 C), temperature source Oral, resp. rate 18, height 5' 2"  (1.575 m), weight 59 kg, SpO2 93 %.  GENERAL:  77 y.o.-year-old patient lying in the bed with no acute distress.  Frail-appearing EYES: Pupils equal, round, reactive to light and accommodation. No scleral icterus. Extraocular muscles intact.  HEENT: Head atraumatic, normocephalic. Oropharynx and nasopharynx clear.  NECK:  Supple, no jugular venous distention. No thyroid enlargement, no tenderness.  LUNGS: Diminished breath sounds on right. No use of accessory muscles of respiration.  CARDIOVASCULAR: S1, S2 normal. No murmurs, rubs, or gallops.  ABDOMEN: Soft, nontender, nondistended. Bowel sounds present. No organomegaly or mass.  EXTREMITIES: No pedal edema, cyanosis, or clubbing.  NEUROLOGIC: Cranial nerves II through XII are intact. Muscle strength 5/5 in all extremities. Sensation intact. Gait not checked.  PSYCHIATRIC: The patient is alert and oriented x 3.  SKIN: No obvious rash, lesion, or ulcer.   LABORATORY PANEL:   CBC Recent Labs  Lab 02/03/18 0958 02/03/18 1148 02/04/18 0523 02/09/18 1029  WBC 2.6*  --  2.6* 3.4*  HGB 12.0 12.4 11.9* 12.6  HCT 36.4 38.2 36.9 38.9  PLT 57*  --  67* 68*  MCV 91.0  --  91.3 92.2  MCH 30.0  --  29.5 29.9  MCHC 33.0  --  32.2 32.4  RDW 17.2*  --  17.2* 17.2*  LYMPHSABS  --   --   --  0.6*  MONOABS  --   --   --  0.3  EOSABS  --   --   --  0.1  BASOSABS  --   --   --  0.0   ------------------------------------------------------------------------------------------------------------------  Chemistries  Recent Labs  Lab 02/09/18 1029  NA 141  K 3.8  CL 109  CO2 24  GLUCOSE 112*  BUN 10  CREATININE 0.68  CALCIUM 8.5*  AST 32  ALT 19  ALKPHOS 82  BILITOT 1.5*    ------------------------------------------------------------------------------------------------------------------ estimated creatinine clearance is 46.6 mL/min (by C-G formula based on SCr of 0.68 mg/dL). ------------------------------------------------------------------------------------------------------------------ No results for input(s): TSH, T4TOTAL, T3FREE, THYROIDAB in the last 72 hours.  Invalid input(s): FREET3   Coagulation profile Recent Labs  Lab 02/03/18 0440  INR 1.59   ------------------------------------------------------------------------------------------------------------------- No results for input(s): DDIMER in the last 72 hours. -------------------------------------------------------------------------------------------------------------------  Cardiac Enzymes Recent Labs  Lab 02/09/18 1029  TROPONINI <0.03   ------------------------------------------------------------------------------------------------------------------ Invalid input(s): POCBNP  ---------------------------------------------------------------------------------------------------------------  Urinalysis    Component Value Date/Time   COLORURINE AMBER (A) 02/01/2018 1705   APPEARANCEUR CLEAR (A) 02/01/2018 1705   APPEARANCEUR Clear 08/21/2013 1035   LABSPEC 1.014 02/01/2018 1705   LABSPEC 1.021 08/21/2013 1035   PHURINE 6.0 02/01/2018 1705   GLUCOSEU NEGATIVE 02/01/2018 1705   GLUCOSEU Negative 08/21/2013 1035   HGBUR LARGE (A) 02/01/2018 1705   BILIRUBINUR NEGATIVE 02/01/2018 1705   BILIRUBINUR Negative 08/21/2013 1035   KETONESUR NEGATIVE 02/01/2018 1705   PROTEINUR NEGATIVE 02/01/2018 1705   NITRITE NEGATIVE 02/01/2018 1705   LEUKOCYTESUR NEGATIVE 02/01/2018 1705   LEUKOCYTESUR Negative 08/21/2013 1035     RADIOLOGY: Dg Chest 2 View  Result Date: 02/09/2018 CLINICAL DATA:  Shortness of breath starting this morning EXAM: CHEST - 2 VIEW COMPARISON:  06/19/2017 FINDINGS:  There is opacification of about 75% of the right hemithorax, probably due to a large pleural effusion with passive atelectasis. This represents a significant enlargement compared to 06/19/2017, and only a small amount of aerated right upper lobe is observed. The left lung appears clear. The patient is rotated to the right on today's radiograph, reducing diagnostic sensitivity and specificity. Mild thoracic kyphosis. Chronic compression fracture at L2. IMPRESSION: 1. Opacification of about 75% of the right hemithorax, probably due to a large right pleural effusion. 2. The left lung appears clear. 3. Chronic L2 compression fracture. 4. Mild thoracic kyphosis. Electronically Signed   By: Van Clines M.D.   On: 02/09/2018 10:56    EKG: Orders placed or performed during the hospital encounter of 02/09/18  . ED EKG  . ED EKG  . EKG 12-Lead  . EKG 12-Lead    IMPRESSION AND PLAN: *Acute large right pleural effusion Referred to the observation unit, consult IR for thoracentesis,  n.p.o. for now, transfuse 2 unit platelets for now given chronic thrombocytopenia due to splenomegaly, check INR, supplemental oxygen PRN  *Chronic thrombocytopenia Stable Thought to be due to sequestration from splenomegaly-followed by Dr. Berniece Pap Plan of care as stated above  *Recent acute vaginal bleeding Discharge within the last week, was due to chronic thrombocytopenia Resolved  *Chronic hypothyroidism, unspecified Continue Synthroid  *Chronic GERD Stable PPI daily  *Chronic depression Stable Continue home psychotropic regiment  *Chronic cirrhosis Stable Continue rifaximin  *Chronic severe protein calorie malnutrition Stable Continue meal supplementation   All the records are reviewed and case discussed with ED provider. Management plans discussed with the patient, family and they are in agreement.  CODE STATUS:full Code Status History    Date Active Date Inactive Code Status Order ID  Comments User Context   02/01/2018 2221 02/05/2018 1815 Full Code [id number]  Demetrios Loll, MD Inpatient   11/20/2017 1648 11/24/2017 1647 Full Code [id number]  Gorden Harms, MD Inpatient   06/20/2016 2332 07/04/2016 1956 Full Code [id number]  Clapacs, Madie Reno, MD Inpatient    Advance Directive Documentation     Most Recent Value  Type of Advance Directive  Healthcare Power of Attorney  Pre-existing out of facility DNR order (yellow form or pink MOST form)  -  "MOST" Form in Place?  -       TOTAL TIME TAKING CARE OF THIS PATIENT: 40 minutes.    Avel Peace Eunice Winecoff M.D on 02/09/2018   Between 7am to 6pm - Pager - [phone]  After 6pm go to [email or web address] - password [email or web address] [facility]  Office  [phone]  CC: Primary care physician; Kirk Ruths, MD   Note: This dictation was prepared with Dragon dictation along with smaller phrase technology. Any transcriptional errors that result from this process are unintentional.

## 2018-02-09 NOTE — Progress Notes (Signed)
Family Meeting Note  Advance Directive:yes  Today a meeting took place with the Patient.  Patient is able to participate   The following clinical team members were present during this meeting:MD  The following were discussed:Patient's diagnosis: Right pleural effusion, cirrhosis, thrombocytopenia, Patient's progosis: Unable to determine and Goals for treatment: Full Code  Additional follow-up to be provided: prn  Time spent during discussion:20 minutes  Gorden Harms, MD

## 2018-02-09 NOTE — ED Triage Notes (Signed)
Shob that began this am. Was discharged from inpatient unit on Friday for unrelated issue. Pt appears in NAD. Sat 93% and grandson states this is around her normal.

## 2018-02-09 NOTE — Procedures (Signed)
Interventional Radiology Procedure Note  Procedure: US guided right thoracentesis  Complications: None  Estimated Blood Loss: None  Findings: 2.1 L clear, yellow fluid removed from right pleural space. Sample sent for labs.  Venetia Night. Kathlene Cote, M.D Pager:  [phone]

## 2018-02-09 NOTE — Progress Notes (Addendum)
Spoke to blood bank. Apparently per blood bank, pnt only qualified to receive 1 unit of platelets. Only 1 bag was prepared in blood bank and blood bank notified Day shift charge RN that a platelet redraw was supposed to be done following platelet administration to determine if 2 unit was necessary.   Contacted Dr. Jannifer Franklin to clarify order who states that he would put in an order to do a redraw and then assess necessity for an additional.

## 2018-02-09 NOTE — Progress Notes (Signed)
1 unit completed

## 2018-02-10 ENCOUNTER — Observation Stay (HOSPITAL_BASED_OUTPATIENT_CLINIC_OR_DEPARTMENT_OTHER)
Admit: 2018-02-10 | Discharge: 2018-02-10 | Disposition: A | Discharge status: Home / Self Care | Payer: Medicare Other | Attending: Internal Medicine | Admitting: Internal Medicine

## 2018-02-10 ENCOUNTER — Observation Stay: Payer: Medicare Other

## 2018-02-10 DIAGNOSIS — Z6823 Body mass index (BMI) 23.0-23.9, adult: Secondary | ICD-10-CM

## 2018-02-10 DIAGNOSIS — I340 Nonrheumatic mitral (valve) insufficiency: Secondary | ICD-10-CM | POA: Diagnosis not present

## 2018-02-10 DIAGNOSIS — J948 Other specified pleural conditions: Principal | ICD-10-CM | POA: Diagnosis present

## 2018-02-10 DIAGNOSIS — J918 Pleural effusion in other conditions classified elsewhere: Secondary | ICD-10-CM | POA: Diagnosis present

## 2018-02-10 DIAGNOSIS — K219 Gastro-esophageal reflux disease without esophagitis: Secondary | ICD-10-CM | POA: Diagnosis present

## 2018-02-10 DIAGNOSIS — Z882 Allergy status to sulfonamides status: Secondary | ICD-10-CM

## 2018-02-10 DIAGNOSIS — F329 Major depressive disorder, single episode, unspecified: Secondary | ICD-10-CM | POA: Diagnosis present

## 2018-02-10 DIAGNOSIS — E039 Hypothyroidism, unspecified: Secondary | ICD-10-CM | POA: Diagnosis present

## 2018-02-10 DIAGNOSIS — K766 Portal hypertension: Secondary | ICD-10-CM | POA: Diagnosis present

## 2018-02-10 DIAGNOSIS — J9811 Atelectasis: Secondary | ICD-10-CM | POA: Diagnosis present

## 2018-02-10 DIAGNOSIS — D61818 Other pancytopenia: Secondary | ICD-10-CM | POA: Diagnosis present

## 2018-02-10 DIAGNOSIS — J189 Pneumonia, unspecified organism: Secondary | ICD-10-CM | POA: Diagnosis present

## 2018-02-10 DIAGNOSIS — I82890 Acute embolism and thrombosis of other specified veins: Secondary | ICD-10-CM | POA: Diagnosis present

## 2018-02-10 DIAGNOSIS — K7460 Unspecified cirrhosis of liver: Secondary | ICD-10-CM | POA: Diagnosis present

## 2018-02-10 DIAGNOSIS — E43 Unspecified severe protein-calorie malnutrition: Secondary | ICD-10-CM | POA: Diagnosis present

## 2018-02-10 DIAGNOSIS — D696 Thrombocytopenia, unspecified: Secondary | ICD-10-CM | POA: Diagnosis present

## 2018-02-10 DIAGNOSIS — F419 Anxiety disorder, unspecified: Secondary | ICD-10-CM | POA: Diagnosis present

## 2018-02-10 DIAGNOSIS — K760 Fatty (change of) liver, not elsewhere classified: Secondary | ICD-10-CM | POA: Diagnosis present

## 2018-02-10 DIAGNOSIS — Z66 Do not resuscitate: Secondary | ICD-10-CM | POA: Diagnosis present

## 2018-02-10 DIAGNOSIS — Z79899 Other long term (current) drug therapy: Secondary | ICD-10-CM

## 2018-02-10 LAB — PROCALCITONIN

## 2018-02-10 LAB — CBC
HEMATOCRIT: 31.5 % — AB (ref 36.0–46.0)
Hemoglobin: 10.2 g/dL — ABNORMAL LOW (ref 12.0–15.0)
MCH: 30 pg (ref 26.0–34.0)
MCHC: 32.4 g/dL (ref 30.0–36.0)
MCV: 92.6 fL (ref 80.0–100.0)
NRBC: 0 % (ref 0.0–0.2)
PLATELETS: 82 10*3/uL — AB (ref 150–400)
RBC: 3.4 MIL/uL — AB (ref 3.87–5.11)
RDW: 17.2 % — ABNORMAL HIGH (ref 11.5–15.5)
WBC: 2.8 10*3/uL — ABNORMAL LOW (ref 4.0–10.5)

## 2018-02-10 LAB — BPAM PLATELET PHERESIS
BLOOD PRODUCT EXPIRATION DATE: 201910231430
ISSUE DATE / TIME: 201910221618
Unit Type and Rh: 9500

## 2018-02-10 LAB — PREPARE PLATELET PHERESIS: Unit division: 0

## 2018-02-10 MED ORDER — SODIUM CHLORIDE 0.9 % IV SOLN
INTRAVENOUS | Status: DC
  Administered 2018-02-10: 17:00:00 via INTRAVENOUS

## 2018-02-10 MED ORDER — LEVOFLOXACIN IN D5W 500 MG/100ML IV SOLN
500.0000 mg | INTRAVENOUS | Status: DC
  Administered 2018-02-10: 500 mg via INTRAVENOUS
  Filled 2018-02-10 (×3): qty 100

## 2018-02-10 MED ORDER — IOPAMIDOL (ISOVUE-300) INJECTION 61%
15.0000 mL | INTRAVENOUS | Status: AC
  Administered 2018-02-10 (×2): 15 mL via ORAL

## 2018-02-10 MED ORDER — IOPAMIDOL (ISOVUE-300) INJECTION 61%
100.0000 mL | Freq: Once | INTRAVENOUS | Status: AC | PRN
  Administered 2018-02-10: 100 mL via INTRAVENOUS

## 2018-02-10 MED ORDER — IPRATROPIUM-ALBUTEROL 0.5-2.5 (3) MG/3ML IN SOLN: 3.0000 mL | RESPIRATORY_TRACT | Status: DC | PRN

## 2018-02-10 NOTE — Care Management Obs Status (Signed)
[facility] NOTIFICATION   Patient Details  Name: STEPHANNY TSUTSUI MRN: [id number] Date of Birth: [birth year]   Medicare Observation Status Notification Given:  Yes    Sadie Pickar A Manford Sprong, RN 02/10/2018, 8:50 AM

## 2018-02-10 NOTE — Plan of Care (Signed)
Pt c/o mid chest pain x2 during the shift, norco given with improvement.  Prior the end of shift bladder scan showed 42m.  Pt voided 4071m  In&out cath order d/c.

## 2018-02-10 NOTE — Evaluation (Signed)
Physical Therapy Evaluation Patient Details Name: Cassidy [name] MRN: [id number] DOB: [birth year] Today's Date: 02/10/2018   History of Present Illness  Pt is a 77 y.o. female presenting to hospital 02/09/18 with SOB.  Pt admitted with acute large R pleural effusion and chronic thrombocytopenia.  Pt s/p thoracentesis 02/09/18.  Pt with recent discharge from hospital (vaginal bleeding d/t chronic thrombocytopenia and s/p D&C).  PMH includes dementia, chronic L2 compression fx, anxiety, depression, thrombocytopenia, orthostasis.  Clinical Impression  Prior to hospital admission, pt was ambulatory (does not like to use walker though).  Pt plans to discharge to her sisters home (1 level with 3 steps and B railings to enter; sister plans to have 24/7 assist for pt).  Currently pt is CGA with transfers and ambulating 70 feet with RW.  Increased shuffling gait noted with increased distance ambulated.  Pt requiring UE support for balance with ambulation (pt encouraged to use RW at home for safety/balance).  Pt would benefit from skilled PT to address noted impairments and functional limitations (see below for any additional details).  Upon hospital discharge, recommend pt discharge home with SBA with functional mobility (and use of RW for stability).    Follow Up Recommendations Home health PT;Supervision for mobility/OOB    Equipment Recommendations  Rolling walker with 5" wheels    Recommendations for Other Services       Precautions / Restrictions Precautions Precautions: Fall Restrictions Weight Bearing Restrictions: No      Mobility  Bed Mobility Overal bed mobility: Needs Assistance Bed Mobility: Supine to Sit;Sit to Supine     Supine to sit: Modified independent (Device/Increase time) Sit to supine: Mod assist   General bed mobility comments: Increased effort to perform semi-supine to sitting; assist for B LE's sit to supine  Transfers Overall transfer level: Needs  assistance Equipment used: Rolling walker (2 wheeled) Transfers: Sit to/from [facility] Sit to Stand: Min guard Stand pivot transfers: Min guard       General transfer comment: increased effort to stand with RW from bed and from recliner  Ambulation/Gait Ambulation/Gait assistance: Min guard Gait Distance (Feet): 70 Feet Assistive device: Rolling walker (2 wheeled)   Gait velocity: decreased   General Gait Details: increased shuffling gait with distance; vc's required to stay closer to RW with increased distance ambulated  Stairs            Wheelchair Mobility    Modified Rankin (Stroke Patients Only)       Balance Overall balance assessment: Needs assistance Sitting-balance support: No upper extremity supported;Feet supported Sitting balance-Leahy Scale: Good Sitting balance - Comments: steady sitting reaching within BOS   Standing balance support: No upper extremity supported;Single extremity supported Standing balance-Leahy Scale: Poor Standing balance comment: requires at least single UE support for static standing balance                             Pertinent Vitals/Pain Pain Assessment: 0-10 Pain Score: 5  Pain Location: chest Pain Descriptors / Indicators: Discomfort Pain Intervention(s): Limited activity within patient's tolerance;Monitored during session;Repositioned;Patient requesting pain meds-RN notified  Vitals (HR and O2 on room air) stable and WFL throughout treatment session.    Home Living Family/patient expects to be discharged to:: Private residence Living Arrangements: (plan to discharge home to sister's home) Available Help at Discharge: Family(Sister plans to arrange 24/7 assist upon discharge) Type of Home: House Home Access: Stairs to enter Entrance Stairs-Rails: Right;Left;Can  reach both Entrance Stairs-Number of Steps: 3 Home Layout: One level Home Equipment: Cane - single point;Walker - 2  wheels;Wheelchair - manual;Grab bars - tub/shower Additional Comments: Life alert    Prior Function Level of Independence: Needs assistance   Gait / Transfers Assistance Needed: Pt does not like to use RW at home; pt with h/o falls; ambulatory  ADL's / Homemaking Assistance Needed: Assistance from grandson (EMT) and sister        Hand Dominance        Extremity/Trunk Assessment   Upper Extremity Assessment Upper Extremity Assessment: Generalized weakness    Lower Extremity Assessment Lower Extremity Assessment: Generalized weakness    Cervical / Trunk Assessment Cervical / Trunk Assessment: Normal  Communication   Communication: No difficulties  Cognition Arousal/Alertness: Awake/alert Behavior During Therapy: WFL for tasks assessed/performed Overall Cognitive Status: History of cognitive impairments - at baseline(Oriented to person, place, month and year.  Not oriented to day or situation without cueing)                                        General Comments   Nursing cleared pt for participation in physical therapy.  Pt agreeable to PT session.  Pt's sister present during session.    Exercises  Transfers and ambulation   Assessment/Plan    PT Assessment Patient needs continued PT services  PT Problem List Decreased strength;Decreased activity tolerance;Decreased balance;Decreased mobility;Decreased knowledge of use of DME;Pain       PT Treatment Interventions DME instruction;Gait training;Stair training;Functional mobility training;Therapeutic activities;Therapeutic exercise;Balance training;Patient/family education    PT Goals (Current goals can be found in the Care Plan section)  Acute Rehab PT Goals Patient Stated Goal: to improve strength PT Goal Formulation: With patient/family Time For Goal Achievement: 02/24/18 Potential to Achieve Goals: Good    Frequency Min 2X/week   Barriers to discharge        Co-evaluation                AM-PAC PT "6 Clicks" Daily Activity  Outcome Measure Difficulty turning over in bed (including adjusting bedclothes, sheets and blankets)?: A Little Difficulty moving from lying on back to sitting on the side of the bed? : A Lot Difficulty sitting down on and standing up from a chair with arms (e.g., wheelchair, bedside commode, etc,.)?: Unable Help needed moving to and from a bed to chair (including a wheelchair)?: A Little Help needed walking in hospital room?: A Little Help needed climbing 3-5 steps with a railing? : A Little 6 Click Score: 15    End of Session Equipment Utilized During Treatment: Gait belt Activity Tolerance: Patient tolerated treatment well Patient left: in bed;with call bell/phone within reach;with bed alarm set;with family/visitor present Nurse Communication: Mobility status;Patient requests pain meds;Precautions PT Visit Diagnosis: Other abnormalities of gait and mobility (R26.89);Muscle weakness (generalized) (M62.81);History of falling (Z91.81);Difficulty in walking, not elsewhere classified (R26.2)    Time: 3790-2409 PT Time Calculation (min) (ACUTE ONLY): 34 min   Charges:   PT Evaluation $PT Eval Low Complexity: 1 Low PT Treatments $Therapeutic Activity: 8-22 mins       Leitha Bleak, PT 02/10/18, 5:18 PM [phone]

## 2018-02-11 ENCOUNTER — Observation Stay: Payer: Medicare Other

## 2018-02-11 LAB — ECHOCARDIOGRAM COMPLETE
HEIGHTINCHES: 62 in
Weight: 2080 oz

## 2018-02-11 MED ORDER — LEVOFLOXACIN 500 MG PO TABS: 500.0000 mg | ORAL_TABLET | Freq: Every day | ORAL | 0 refills | Status: DC

## 2018-02-11 NOTE — Discharge Summary (Signed)
[employer] [facility] - [place] at [facility], 77 y.o., DOB January 31, 1941, MRN [id number]. 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  She was noted to have a large right-sided pleural effusion.  Patient did not have a previous x-ray to compare to.  She underwent a thoracentesis.  Fluid was not sent for analysis by the admitting physician.  Patient had a CT scan of the abdomen pelvis and chest which showed nonocclusive thrombus in the splenic vein.  Patient is not a candidate for anticoagulation due to her severe thrombocytopenia and recent bleeding.  Patient CT did suggest possible liver cirrhosis.  Is an appointment to be followed up with GI as outpatient.  CT also showed possible pneumothorax small repeat chest x-ray today shows no evidence of pneumothorax.  Patient is doing much better and stable for discharge to home.             Consults  None  Significant Tests:  See full reports for all details     Dg Chest 1 View  Result Date: 02/09/2018 CLINICAL DATA:  Post thoracentesis. EXAM: CHEST  1 VIEW COMPARISON:  Ultrasound 02/09/2018.  Chest x-ray 02/09/2018. FINDINGS: Mediastinum is prominent. Mediastinal process including adenopathy cannot be excluded.  Bibasilar atelectasis. Significant reduction in size of right-sided pleural effusion. Mild residual. No pneumothorax. IMPRESSION: 1. Mediastinal prominence. A process such as mediastinal adenopathy could present in this fashion. 2. Mild bibasilar atelectasis. Interim significant reduction in right-sided pleural effusion following thoracentesis with minimal residual. No pneumothorax. Electronically Signed   By: Marcello Moores  Register   On: 02/09/2018 15:52   Dg Chest 2 View  Result Date: 02/11/2018 CLINICAL DATA:  Follow-up right pneumothorax EXAM: CHEST - 2 VIEW COMPARISON:  02/10/2018 FINDINGS: Cardiac shadow is stable. Right-sided pleural effusion and lower lobe infiltrate is again noted and stable. The previously seen pneumothorax is not well appreciated on this exam. The left lung remains clear. No acute bony abnormality is noted. IMPRESSION: Stable right-sided effusion and right basilar infiltrate. No definitive pneumothorax is noted. Electronically Signed   By: Inez Catalina M.D.   On: 02/11/2018 10:08   Dg Chest 2 View  Result Date: 02/09/2018 CLINICAL DATA:  Shortness of breath starting this morning EXAM: CHEST - 2 VIEW COMPARISON:  06/19/2017 FINDINGS: There is opacification of about 75% of the right hemithorax, probably due to a large pleural effusion with passive atelectasis. This represents a significant enlargement compared to 06/19/2017, and only a small amount of aerated right upper lobe is observed. The left lung appears clear. The patient is rotated to the right on today's radiograph, reducing diagnostic sensitivity and specificity. Mild thoracic kyphosis. Chronic compression fracture at L2. IMPRESSION: 1. Opacification of about 75% of the right hemithorax, probably due to a large right pleural effusion. 2. The left lung appears clear. 3. Chronic L2 compression fracture. 4. Mild thoracic kyphosis. Electronically Signed   By: Cindra Eves.D.  On: 02/09/2018 10:56   Ct Chest W  Contrast  Result Date: 02/10/2018 CLINICAL DATA:  77 y.o. 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 Marjory Sneddon, presents today with acute shortness of breath, work-up in the emergency room noted for platelet count of 68-stable compared to previous, chest x-ray noted for large right pleural effusion, patient evaluated bedside, family present, patient does have some chronic mild memory deficits but understands her current condition, patient is now being admitted for acute right pleural effusion.S/P [place] Thoracentes. EXAM: CT CHEST, ABDOMEN, AND PELVIS WITH CONTRAST TECHNIQUE: Multidetector CT imaging of the chest, abdomen and pelvis was performed following the standard protocol during bolus administration of intravenous contrast. CONTRAST:  100 mL ISOVUE-300 IOPAMIDOL (ISOVUE-300) INJECTION 61% COMPARISON:  Ultrasound, 11/21/2017. FINDINGS: CT CHEST FINDINGS Cardiovascular: Heart is normal in size and configuration. Minor coronary artery calcifications. No pericardial effusion. Great vessels are normal in caliber. There is aortic atherosclerosis most prominent along the lower descending thoracic aorta. No dissection. Mediastinum/Nodes: No mediastinal or hilar masses or pathologically enlarged lymph nodes. Trachea is unremarkable. 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 Lungs/Pleura: Small right pneumothorax. Small to moderate size right pleural effusion. Minimal amount of left pleural fluid. There is opacity in the right lower lobe, predominantly ground-glass opacity, which is more confluent posteriorly and at the base. There is mild dependent subsegmental atelectasis in the left lower lobe. Mild dependent subsegmental atelectasis is noted in the posterior right upper lobe. No evidence of  pulmonary edema. No left pneumothorax. Musculoskeletal: Compression fracture of T6, which appears chronic. No other thoracic fractures. No osteoblastic or osteolytic lesions. CT ABDOMEN PELVIS FINDINGS Hepatobiliary: Liver is normal in attenuation. No mass or focal lesion. There is central volume loss. Trace amount of fluid is seen adjacent to the gallbladder. No gallstone. Gallbladder is minimally distended. No bile duct dilation. Pancreas: No pancreatic mass or inflammation. Spleen: Spleen is enlarged measuring 15 x 7 x 13 cm. No splenic mass or focal lesion. Adrenals/Urinary Tract: Adrenal glands are unremarkable. Kidneys are normal, without renal calculi, focal lesion, or hydronephrosis. Bladder is unremarkable. Stomach/Bowel: Stomach is unremarkable. Small bowel normal in caliber. No wall thickening or inflammation. Mild dilation of the right colon, maximum diameter 6 cm. There is fold thickening of the ascending and transverse colons, most evident along the ascending colon. No wall thickening. Vascular/Lymphatic: There are numerous venous collaterals in the upper abdomen. Nonocclusive thrombus lies in the splenic vein extending from the portal splenic confluence. Portal and superior mesenteric veins are widely patent. Bilateral vessels are seen primarily along the coronary vein which joins numerous paraesophageal varices. Splenic vein collaterals connect with the left renal vein. There is aortic atherosclerosis.  No aneurysm. Reproductive: Endometrium appears distended. This may be from fluid or endometrial thickening. This measures 2.8 cm in greatest anterior-posterior thickness. No adnexal masses. Other: Trace amount of ascites is seen adjacent to the liver and right colon. Trace amount ascites collects in the pelvic cul-de-sac. Musculoskeletal: Chronic compression fracture of L2. No acute fracture. No osteoblastic or osteolytic lesions. IMPRESSION: CHEST CT 1. Small right pneumothorax. 2. Small to moderate  size right pleural effusion. Right lower lobe consolidation with associated dependent atelectasis. The consolidation is consistent with pneumonia. 3. Mild dependent subsegmental atelectasis in the right upper lobe and left lower lobe. Trace left pleural effusion. 4. Aortic  atherosclerosis. ABDOMEN AND PELVIS CT 1. Findings are consistent with portal venous hypertension. There is central volume loss of the liver suggesting cirrhosis as etiology. Spleen is enlarged, similar to its measurement on the recent prior ultrasound. There are numerous venous collaterals including prominent paraesophageal varices. Nonocclusive thrombus is noted in the central splenic vein. There is a trace amount of associated ascites. 2. Fold thickening of the transverse and ascending colon. This is nonspecific. This is likely edema related to the portal venous hypertension. 3. Endometrial fluid versus a thickened endometrium. Recommend further assessment with pelvic ultrasound. 4. Aortic atherosclerosis. 5. No evidence of intraperitoneal or retroperitoneal hemorrhage. Electronically Signed   By: Lajean Manes M.D.   On: 02/10/2018 13:52   Ct Abdomen Pelvis W Contrast  Result Date: 02/10/2018 CLINICAL DATA:  77 y.o. 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 Marjory Sneddon, presents today with acute shortness of breath, work-up in the emergency room noted for platelet count of 68-stable compared to previous, chest x-ray noted for large right pleural effusion, patient evaluated bedside, family present, patient does have some chronic mild memory deficits but understands her current condition, patient is now being admitted for acute right pleural effusion.S/P [place] Thoracentes. EXAM: CT CHEST, ABDOMEN, AND PELVIS WITH CONTRAST TECHNIQUE: Multidetector CT  imaging of the chest, abdomen and pelvis was performed following the standard protocol during bolus administration of intravenous contrast. CONTRAST:  100 mL ISOVUE-300 IOPAMIDOL (ISOVUE-300) INJECTION 61% COMPARISON:  Ultrasound, 11/21/2017. FINDINGS: CT CHEST FINDINGS Cardiovascular: Heart is normal in size and configuration. Minor coronary artery calcifications. No pericardial effusion. Great vessels are normal in caliber. There is aortic atherosclerosis most prominent along the lower descending thoracic aorta. No dissection. Mediastinum/Nodes: No mediastinal or hilar masses or pathologically enlarged lymph nodes. Trachea is unremarkable. 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 Lungs/Pleura: Small right pneumothorax. Small to moderate size right pleural effusion. Minimal amount of left pleural fluid. There is opacity in the right lower lobe, predominantly ground-glass opacity, which is more confluent posteriorly and at the base. There is mild dependent subsegmental atelectasis in the left lower lobe. Mild dependent subsegmental atelectasis is noted in the posterior right upper lobe. No evidence of pulmonary edema. No left pneumothorax. Musculoskeletal: Compression fracture of T6, which appears chronic. No other thoracic fractures. No osteoblastic or osteolytic lesions. CT ABDOMEN PELVIS FINDINGS Hepatobiliary: Liver is normal in attenuation. No mass or focal lesion. There is central volume loss. Trace amount of fluid is seen adjacent to the gallbladder. No gallstone. Gallbladder is minimally distended. No bile duct dilation. Pancreas: No pancreatic mass or inflammation. Spleen: Spleen is enlarged measuring 15 x 7 x 13 cm. No splenic mass or focal lesion. Adrenals/Urinary Tract: Adrenal glands are unremarkable. Kidneys are normal, without renal calculi, focal lesion, or hydronephrosis. Bladder is unremarkable. Stomach/Bowel: Stomach is unremarkable. Small bowel normal in caliber. No  wall thickening or inflammation. Mild dilation of the right colon, maximum diameter 6 cm. There is fold thickening of the ascending and transverse colons, most evident along the ascending colon. No wall thickening. Vascular/Lymphatic: There are numerous venous collaterals in the upper abdomen. Nonocclusive thrombus lies in the splenic vein extending from the portal splenic confluence. Portal and superior mesenteric veins are widely patent. Bilateral vessels are seen primarily along the coronary vein which joins numerous paraesophageal varices. Splenic vein collaterals connect  with the left renal vein. There is aortic atherosclerosis.  No aneurysm. Reproductive: Endometrium appears distended. This may be from fluid or endometrial thickening. This measures 2.8 cm in greatest anterior-posterior thickness. No adnexal masses. Other: Trace amount of ascites is seen adjacent to the liver and right colon. Trace amount ascites collects in the pelvic cul-de-sac. Musculoskeletal: Chronic compression fracture of L2. No acute fracture. No osteoblastic or osteolytic lesions. IMPRESSION: CHEST CT 1. Small right pneumothorax. 2. Small to moderate size right pleural effusion. Right lower lobe consolidation with associated dependent atelectasis. The consolidation is consistent with pneumonia. 3. Mild dependent subsegmental atelectasis in the right upper lobe and left lower lobe. Trace left pleural effusion. 4. Aortic atherosclerosis. ABDOMEN AND PELVIS CT 1. Findings are consistent with portal venous hypertension. There is central volume loss of the liver suggesting cirrhosis as etiology. Spleen is enlarged, similar to its measurement on the recent prior ultrasound. There are numerous venous collaterals including prominent paraesophageal varices. Nonocclusive thrombus is noted in the central splenic vein. There is a trace amount of associated ascites. 2. Fold thickening of the transverse and ascending colon. This is nonspecific. This  is likely edema related to the portal venous hypertension. 3. Endometrial fluid versus a thickened endometrium. Recommend further assessment with pelvic ultrasound. 4. Aortic atherosclerosis. 5. No evidence of intraperitoneal or retroperitoneal hemorrhage. Electronically Signed   By: Lajean Manes M.D.   On: 02/10/2018 13:52   US Pelvic Complete With Transvaginal  Result Date: 02/01/2018 CLINICAL DATA:  Vaginal bleeding for 3 days EXAM: TRANSABDOMINAL AND TRANSVAGINAL ULTRASOUND OF PELVIS TECHNIQUE: Both transabdominal and transvaginal ultrasound examinations of the pelvis were performed. Transabdominal technique was performed for global imaging of the pelvis including uterus, ovaries, adnexal regions, and pelvic cul-de-sac. It was necessary to proceed with endovaginal exam following the transabdominal exam to visualize the uterus, endometrium, and ovaries. Transvaginal imaging was limited by patient discomfort. COMPARISON:  None FINDINGS: Uterus Measurements: 5.9 x 3.3 x 3.7 cm. Retroverted. Normal morphology without mass Endometrium Thickness: 16 mm. Abnormal appearing heterogeneous endometrial complex. No focal mass lesion delineated. Right ovary Not visualized on either transabdominal or endovaginal imaging, likely obscured by bowel Left ovary Not visualized on either transabdominal or endovaginal imaging, likely obscured by bowel Other findings No abnormal free fluid. IMPRESSION: Nonvisualization of ovaries. Abnormal appearing heterogeneous endometrial complex 16 mm thick; if bleeding remains unresponsive to hormonal or medical therapy, focal lesion work-up with sonohysterogram should be considered. Endometrial biopsy should also be considered in pre-menopausal patients at high risk for endometrial carcinoma. (Ref: Radiological Reasoning: Algorithmic Workup of Abnormal Vaginal Bleeding with Endovaginal Sonography and Sonohysterography. AJR 2008; 428:J68-11) Electronically Signed   By: Lavonia Dana M.D.    On: 02/01/2018 16:36   US Thoracentesis Asp Pleural Space W/img Guide  Result Date: 02/09/2018 CLINICAL DATA:  Large right pleural effusion. EXAM: ULTRASOUND GUIDED RIGHT THORACENTESIS COMPARISON:  None. PROCEDURE: An ultrasound guided thoracentesis was thoroughly discussed with the patient and questions answered. The benefits, risks, alternatives and complications were also discussed. The patient understands and wishes to proceed with the procedure. Written consent was obtained. A time-out was performed prior to initiating the procedure. Ultrasound was performed to localize and mark an adequate pocket of fluid in the right chest. The area was then prepped and draped in the normal sterile fashion. 1% Lidocaine was used for local anesthesia. Under ultrasound guidance a 6 French Safe-T-Centesis catheter was introduced. Thoracentesis was performed. The catheter was removed and a dressing applied. COMPLICATIONS: None FINDINGS:  A total of approximately 2.1 L of clear, yellow fluid was removed. A fluid sample was sent for laboratory analysis. IMPRESSION: Successful ultrasound guided right thoracentesis yielding 2.1 L of pleural fluid. Electronically Signed   By: Aletta Edouard M.D.   On: 02/09/2018 16:05       Today   Subjective:   Cassidy [name] patient feeling much better  Objective:   Blood pressure (!) 117/53, pulse 94, temperature 98.2 F (36.8 C), temperature source Oral, resp. rate 18, height 5' 2"  (1.575 m), weight 59 kg, SpO2 94 %.  .  Intake/Output Summary (Last 24 hours) at 02/11/2018 1252 Last data filed at 02/11/2018 0929 Gross per 24 hour  Intake 867.86 ml  Output 1000 ml  Net -132.14 ml    Exam VITAL SIGNS: Blood pressure (!) 117/53, pulse 94, temperature 98.2 F (36.8 C), temperature source Oral, resp. rate 18, height 5' 2"  (1.575 m), weight 59 kg, SpO2 94 %.  GENERAL:  77 y.o.-year-old patient lying in the bed with no acute distress.  EYES: Pupils equal, round, reactive to  light and accommodation. No scleral icterus. Extraocular muscles intact.  HEENT: Head atraumatic, normocephalic. Oropharynx and nasopharynx clear.  NECK:  Supple, no jugular venous distention. No thyroid enlargement, no tenderness.  LUNGS: Normal breath sounds bilaterally, no wheezing, rales,rhonchi or crepitation. No use of accessory muscles of respiration.  CARDIOVASCULAR: S1, S2 normal. No murmurs, rubs, or gallops.  ABDOMEN: Soft, nontender, nondistended. Bowel sounds present. No organomegaly or mass.  EXTREMITIES: No pedal edema, cyanosis, or clubbing.  NEUROLOGIC: Cranial nerves II through XII are intact. Muscle strength 5/5 in all extremities. Sensation intact. Gait not checked.  PSYCHIATRIC: The patient is alert and oriented x 3.  SKIN: No obvious rash, lesion, or ulcer.   Data Review     CBC w Diff:  Lab Results  Component Value Date   WBC 2.8 (L) 02/10/2018   HGB 10.2 (L) 02/10/2018   HGB 15.3 09/21/2013   HCT 31.5 (L) 02/10/2018   HCT 46.7 09/21/2013   PLT 82 (L) 02/10/2018   PLT 67 (L) 09/21/2013   LYMPHOPCT 17 02/09/2018   LYMPHOPCT 19.4 08/22/2013   MONOPCT 9 02/09/2018   MONOPCT 5 09/21/2013   MONOPCT 9.7 08/22/2013   EOSPCT 3 02/09/2018   EOSPCT 3.3 08/22/2013   BASOPCT 1 02/09/2018   BASOPCT 0.9 08/22/2013   CMP:  Lab Results  Component Value Date   NA 141 02/09/2018   NA 137 08/22/2013   K 3.8 02/09/2018   K 3.6 08/22/2013   CL 109 02/09/2018   CL 103 08/22/2013   CO2 24 02/09/2018   CO2 27 08/22/2013   BUN 10 02/09/2018   BUN 11 08/22/2013   CREATININE 0.68 02/09/2018   CREATININE 0.96 08/22/2013   PROT 6.4 (L) 02/09/2018   PROT 7.6 08/21/2013   ALBUMIN 3.4 (L) 02/09/2018   ALBUMIN 3.8 08/21/2013   BILITOT 1.5 (H) 02/09/2018   BILITOT 0.5 08/21/2013   ALKPHOS 82 02/09/2018   ALKPHOS 95 08/21/2013   AST 32 02/09/2018   AST 44 (H) 08/21/2013   ALT 19 02/09/2018   ALT 32 08/21/2013  .  Micro Results No results found for this or any  previous visit (from the past 240 hour(s)).      Code Status Orders  (From admission, onward)         Start     Ordered   02/09/18 1421  Full code  Continuous     02/09/18 1421  Code Status History    Date Active Date Inactive Code Status Order ID Comments User Context   02/01/2018 2221 02/05/2018 1815 Full Code [id number]  Demetrios Loll, MD Inpatient   11/20/2017 1648 11/24/2017 1647 Full Code [id number]  Gorden Harms, MD Inpatient   06/20/2016 2332 07/04/2016 1956 Full Code [id number]  Clapacs, Madie Reno, MD Inpatient    Advance Directive Documentation     Most Recent Value  Type of Advance Directive  Healthcare Power of Attorney  Pre-existing out of facility DNR order (yellow form or pink MOST form)  -  "MOST" Form in Place?  -          Follow-up Information    Kirk Ruths, MD. Go on 02/18/2018.   Specialty:  Internal Medicine Why:  @1 :00 PM Contact information: [place] [address] [phone]           Discharge Medications   Allergies as of 02/11/2018      Reactions   Sulfa Antibiotics Shortness Of Breath      Medication List    TAKE these medications   acidophilus Caps capsule Take 1 capsule by mouth 2 (two) times daily.   ascorbic acid 250 MG tablet Commonly known as:  VITAMIN C Take 1 tablet (250 mg total) by mouth 2 (two) times daily.   busPIRone 15 MG tablet Commonly known as:  BUSPAR Take 1 tablet (15 mg total) by mouth at bedtime.   clonazePAM 0.5 MG tablet Commonly known as:  KLONOPIN Take 1 tablet (0.5 mg total) by mouth daily as needed for anxiety. Only for severe anxiety sx   Copper Gluconate 2 MG Tabs Take 1 tablet (2 mg total) by mouth daily.   escitalopram 10 MG tablet Commonly known as:  LEXAPRO Take 1 tablet (10 mg total) by mouth daily.   levofloxacin 500 MG tablet Commonly known as:  LEVAQUIN Take 1 tablet (500 mg total) by mouth daily for 4 days.   levothyroxine 100 MCG  tablet Commonly known as:  SYNTHROID, LEVOTHROID Take 1 tablet (100 mcg total) by mouth daily before breakfast.   loperamide 2 MG capsule Commonly known as:  IMODIUM Take 2 mg by mouth as needed for diarrhea or loose stools.   megestrol 20 MG tablet Commonly known as:  MEGACE Take 1 tablet (20 mg total) by mouth 2 (two) times daily.   Melatonin 1 MG Tabs Take 5 mg by mouth at bedtime as needed (for sleep).   omeprazole 40 MG capsule Commonly known as:  PRILOSEC Take 1 capsule (40 mg total) by mouth daily.   ondansetron 8 MG disintegrating tablet Commonly known as:  ZOFRAN-ODT Take 8 mg by mouth every 8 (eight) hours as needed for nausea or vomiting.   potassium chloride 10 MEQ tablet Commonly known as:  K-DUR Take 10 mEq by mouth daily.   QUEtiapine 100 MG tablet Commonly known as:  SEROQUEL Take 1 tablet (100 mg total) by mouth at bedtime.   traZODone 150 MG tablet Commonly known as:  DESYREL Take 1 tablet (150 mg total) by mouth at bedtime.   traZODone 50 MG tablet Commonly known as:  DESYREL Take 1 tablet (50 mg total) by mouth at bedtime as needed for sleep. To be combined with 150 mg as needed   XIFAXAN 550 MG Tabs tablet Generic drug:  rifaximin Take 1 tablet by mouth 2 (two) times daily.          Total Time in preparing paper  work, Microbiologist and todays exam - 91 minutes  Dustin Flock M.D on 02/11/2018 at 12:52 PM [place]  [phone]

## 2018-02-11 NOTE — Progress Notes (Signed)
CM notified PT that pt's family requesting w/c for access in/out of home and for community access for medical appts.  Patient suffers from functional mobility limitations which impairs pt's ability/needs to access community for medical appts.  A cane, walker, or crutch will not resolve the patient's issue with community access. A wheelchair is recommended and will allow patient to safely perform needed community access.   Patient has a caregiver who can provide assistance.   Leitha Bleak, PT 02/11/18, 3:39 PM [phone]

## 2018-02-11 NOTE — Plan of Care (Signed)

## 2018-02-11 NOTE — Progress Notes (Signed)
[place] [facility]                                                                                                                                                                                  Patient Demographics   Cassidy [name], is a 77 y.o. female, DOB - November 09, 1940, ALP:[id number]  Admit date - 02/09/2018   Admitting Physician Gorden Harms, MD  Outpatient Primary MD for the patient is Kirk Ruths, MD   LOS - 0  Subjective:     Review of Systems:   CONSTITUTIONAL: No documented fever. No fatigue, weakness. No weight gain, no weight loss.  EYES: No blurry or double vision.  ENT: No tinnitus. No postnasal drip. No redness of the oropharynx.  RESPIRATORY: No cough, no wheeze, no hemoptysis. No dyspnea.  CARDIOVASCULAR: No chest pain. No orthopnea. No palpitations. No syncope.  GASTROINTESTINAL: No nausea, no vomiting or diarrhea. No abdominal pain. No melena or hematochezia.  GENITOURINARY: No dysuria or hematuria.  ENDOCRINE: No polyuria or nocturia. No heat or cold intolerance.  HEMATOLOGY: No anemia. No bruising. No bleeding.  INTEGUMENTARY: No rashes. No lesions.  MUSCULOSKELETAL: No arthritis. No swelling. No gout.  NEUROLOGIC: No numbness, tingling, or ataxia. No seizure-type activity.  PSYCHIATRIC: No anxiety. No insomnia. No ADD.    Vitals:   Vitals:   02/10/18 0941 02/10/18 1225 02/10/18 2034 02/11/18 0355  BP: (!) 112/47 (!) 113/48 (!) 124/41 (!) 119/52  Pulse: 91 86 94 86  Resp: 17 18 17    Temp: 97.8 F (36.6 C) 98.5 F (36.9 C) 98.5 F (36.9 C) 98.1 F (36.7 C)  TempSrc: Axillary Oral Oral Oral  SpO2: 94% 93% 94% 95%  Weight:      Height:        Wt Readings from Last 3 Encounters:  02/09/18 59 kg  02/01/18 56.9 kg  01/14/18 58.8 kg     Intake/Output Summary (Last 24 hours) at 02/11/2018 0911 Last data filed at 02/11/2018 0600 Gross per 24 hour  Intake 693.73 ml  Output 1000 ml  Net -306.27 ml     Physical Exam:   GENERAL: Pleasant-appearing in no apparent distress.  HEAD, EYES, EARS, NOSE AND THROAT: Atraumatic, normocephalic. Extraocular muscles are intact. Pupils equal and reactive to light. Sclerae anicteric. No conjunctival injection. No oro-pharyngeal erythema.  NECK: Supple. There is no jugular venous distention. No bruits, no lymphadenopathy, no thyromegaly.  HEART: Regular rate and rhythm,. No murmurs, no rubs, no clicks.  LUNGS: Clear to auscultation bilaterally. No rales or rhonchi. No wheezes.  ABDOMEN: Soft, flat, nontender, nondistended. Has good bowel sounds. No hepatosplenomegaly appreciated.  EXTREMITIES: No evidence of  any cyanosis, clubbing, or peripheral edema.  +2 pedal and radial pulses bilaterally.  NEUROLOGIC: The patient is alert, awake, and oriented x3 with no focal motor or sensory deficits appreciated bilaterally.  SKIN: Moist and warm with no rashes appreciated.  Psych: Not anxious, depressed LN: No inguinal LN enlargement    Antibiotics   Anti-infectives (From admission, onward)   Start     Dose/Rate Route Frequency Ordered Stop   02/10/18 1600  levofloxacin (LEVAQUIN) IVPB 500 mg     500 mg 100 mL/hr over 60 Minutes Intravenous Every 24 hours 02/10/18 1504     02/09/18 2200  rifaximin (XIFAXAN) tablet 550 mg     550 mg Oral 2 times daily 02/09/18 1421        Medications   Scheduled Meds: . acidophilus  1 capsule Oral BID  . albuterol  5 mg Nebulization Once  . busPIRone  15 mg Oral QHS  . escitalopram  10 mg Oral Daily  . levothyroxine  100 mcg Oral QAC breakfast  . megestrol  20 mg Oral BID  . pantoprazole  40 mg Oral Daily  . potassium chloride  10 mEq Oral Daily  . QUEtiapine  100 mg Oral QHS  . rifaximin  550 mg Oral BID  . traZODone  150 mg Oral QHS  . ascorbic acid  250 mg Oral BID   Continuous Infusions: . sodium chloride Stopped (02/10/18 1853)  . levofloxacin (LEVAQUIN) IV 100 mL/hr at 02/10/18 1855   PRN  Meds:.acetaminophen **OR** acetaminophen, clonazePAM, HYDROcodone-acetaminophen, ipratropium-albuterol, loperamide, Melatonin, ondansetron **OR** ondansetron (ZOFRAN) IV, polyethylene glycol, traZODone   Data Review:   Micro Results No results found for this or any previous visit (from the past 240 hour(s)).  Radiology Reports Dg Chest 1 View  Result Date: 02/09/2018 CLINICAL DATA:  Post thoracentesis. EXAM: CHEST  1 VIEW COMPARISON:  Ultrasound 02/09/2018.  Chest x-ray 02/09/2018. FINDINGS: Mediastinum is prominent. Mediastinal process including adenopathy cannot be excluded. Bibasilar atelectasis. Significant reduction in size of right-sided pleural effusion. Mild residual. No pneumothorax. IMPRESSION: 1. Mediastinal prominence. A process such as mediastinal adenopathy could present in this fashion. 2. Mild bibasilar atelectasis. Interim significant reduction in right-sided pleural effusion following thoracentesis with minimal residual. No pneumothorax. Electronically Signed   By: Marcello Moores  Register   On: 02/09/2018 15:52   Dg Chest 2 View  Result Date: 02/09/2018 CLINICAL DATA:  Shortness of breath starting this morning EXAM: CHEST - 2 VIEW COMPARISON:  06/19/2017 FINDINGS: There is opacification of about 75% of the right hemithorax, probably due to a large pleural effusion with passive atelectasis. This represents a significant enlargement compared to 06/19/2017, and only a small amount of aerated right upper lobe is observed. The left lung appears clear. The patient is rotated to the right on today's radiograph, reducing diagnostic sensitivity and specificity. Mild thoracic kyphosis. Chronic compression fracture at L2. IMPRESSION: 1. Opacification of about 75% of the right hemithorax, probably due to a large right pleural effusion. 2. The left lung appears clear. 3. Chronic L2 compression fracture. 4. Mild thoracic kyphosis. Electronically Signed   By: Van Clines M.D.   On: 02/09/2018  10:56   Ct Chest W Contrast  Result Date: 02/10/2018 CLINICAL DATA:  77 y.o. 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 Marjory Sneddon, presents today with acute shortness of breath, work-up in  the emergency room noted for platelet count of 68-stable compared to previous, chest x-ray noted for large right pleural effusion, patient evaluated bedside, family present, patient does have some chronic mild memory deficits but understands her current condition, patient is now being admitted for acute right pleural effusion.S/P [place] Thoracentes. EXAM: CT CHEST, ABDOMEN, AND PELVIS WITH CONTRAST TECHNIQUE: Multidetector CT imaging of the chest, abdomen and pelvis was performed following the standard protocol during bolus administration of intravenous contrast. CONTRAST:  100 mL ISOVUE-300 IOPAMIDOL (ISOVUE-300) INJECTION 61% COMPARISON:  Ultrasound, 11/21/2017. FINDINGS: CT CHEST FINDINGS Cardiovascular: Heart is normal in size and configuration. Minor coronary artery calcifications. No pericardial effusion. Great vessels are normal in caliber. There is aortic atherosclerosis most prominent along the lower descending thoracic aorta. No dissection. Mediastinum/Nodes: No mediastinal or hilar masses or pathologically enlarged lymph nodes. Trachea is unremarkable. 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 Lungs/Pleura: Small right pneumothorax. Small to moderate size right pleural effusion. Minimal amount of left pleural fluid. There is opacity in the right lower lobe, predominantly ground-glass opacity, which is more confluent posteriorly and at the base. There is mild dependent subsegmental atelectasis in the left lower lobe. Mild dependent subsegmental atelectasis is noted in the posterior right upper  lobe. No evidence of pulmonary edema. No left pneumothorax. Musculoskeletal: Compression fracture of T6, which appears chronic. No other thoracic fractures. No osteoblastic or osteolytic lesions. CT ABDOMEN PELVIS FINDINGS Hepatobiliary: Liver is normal in attenuation. No mass or focal lesion. There is central volume loss. Trace amount of fluid is seen adjacent to the gallbladder. No gallstone. Gallbladder is minimally distended. No bile duct dilation. Pancreas: No pancreatic mass or inflammation. Spleen: Spleen is enlarged measuring 15 x 7 x 13 cm. No splenic mass or focal lesion. Adrenals/Urinary Tract: Adrenal glands are unremarkable. Kidneys are normal, without renal calculi, focal lesion, or hydronephrosis. Bladder is unremarkable. Stomach/Bowel: Stomach is unremarkable. Small bowel normal in caliber. No wall thickening or inflammation. Mild dilation of the right colon, maximum diameter 6 cm. There is fold thickening of the ascending and transverse colons, most evident along the ascending colon. No wall thickening. Vascular/Lymphatic: There are numerous venous collaterals in the upper abdomen. Nonocclusive thrombus lies in the splenic vein extending from the portal splenic confluence. Portal and superior mesenteric veins are widely patent. Bilateral vessels are seen primarily along the coronary vein which joins numerous paraesophageal varices. Splenic vein collaterals connect with the left renal vein. There is aortic atherosclerosis.  No aneurysm. Reproductive: Endometrium appears distended. This may be from fluid or endometrial thickening. This measures 2.8 cm in greatest anterior-posterior thickness. No adnexal masses. Other: Trace amount of ascites is seen adjacent to the liver and right colon. Trace amount ascites collects in the pelvic cul-de-sac. Musculoskeletal: Chronic compression fracture of L2. No acute fracture. No osteoblastic or osteolytic lesions. IMPRESSION: CHEST CT 1. Small right pneumothorax.  2. Small to moderate size right pleural effusion. Right lower lobe consolidation with associated dependent atelectasis. The consolidation is consistent with pneumonia. 3. Mild dependent subsegmental atelectasis in the right upper lobe and left lower lobe. Trace left pleural effusion. 4. Aortic atherosclerosis. ABDOMEN AND PELVIS CT 1. Findings are consistent with portal venous hypertension. There is central volume loss of the liver suggesting cirrhosis as etiology. Spleen is enlarged, similar to its measurement on the recent prior ultrasound. There are numerous venous collaterals including prominent paraesophageal varices. Nonocclusive thrombus is noted in the central splenic vein. There is a trace amount of associated ascites. 2.  Fold thickening of the transverse and ascending colon. This is nonspecific. This is likely edema related to the portal venous hypertension. 3. Endometrial fluid versus a thickened endometrium. Recommend further assessment with pelvic ultrasound. 4. Aortic atherosclerosis. 5. No evidence of intraperitoneal or retroperitoneal hemorrhage. Electronically Signed   By: Lajean Manes M.D.   On: 02/10/2018 13:52   Ct Abdomen Pelvis W Contrast  Result Date: 02/10/2018 CLINICAL DATA:  77 y.o. 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 Marjory Sneddon, presents today with acute shortness of breath, work-up in the emergency room noted for platelet count of 68-stable compared to previous, chest x-ray noted for large right pleural effusion, patient evaluated bedside, family present, patient does have some chronic mild memory deficits but understands her current condition, patient is now being admitted for acute right pleural effusion.S/P [place] Thoracentes. EXAM: CT CHEST, ABDOMEN, AND PELVIS WITH CONTRAST  TECHNIQUE: Multidetector CT imaging of the chest, abdomen and pelvis was performed following the standard protocol during bolus administration of intravenous contrast. CONTRAST:  100 mL ISOVUE-300 IOPAMIDOL (ISOVUE-300) INJECTION 61% COMPARISON:  Ultrasound, 11/21/2017. FINDINGS: CT CHEST FINDINGS Cardiovascular: Heart is normal in size and configuration. Minor coronary artery calcifications. No pericardial effusion. Great vessels are normal in caliber. There is aortic atherosclerosis most prominent along the lower descending thoracic aorta. No dissection. Mediastinum/Nodes: No mediastinal or hilar masses or pathologically enlarged lymph nodes. Trachea is unremarkable. 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 Lungs/Pleura: Small right pneumothorax. Small to moderate size right pleural effusion. Minimal amount of left pleural fluid. There is opacity in the right lower lobe, predominantly ground-glass opacity, which is more confluent posteriorly and at the base. There is mild dependent subsegmental atelectasis in the left lower lobe. Mild dependent subsegmental atelectasis is noted in the posterior right upper lobe. No evidence of pulmonary edema. No left pneumothorax. Musculoskeletal: Compression fracture of T6, which appears chronic. No other thoracic fractures. No osteoblastic or osteolytic lesions. CT ABDOMEN PELVIS FINDINGS Hepatobiliary: Liver is normal in attenuation. No mass or focal lesion. There is central volume loss. Trace amount of fluid is seen adjacent to the gallbladder. No gallstone. Gallbladder is minimally distended. No bile duct dilation. Pancreas: No pancreatic mass or inflammation. Spleen: Spleen is enlarged measuring 15 x 7 x 13 cm. No splenic mass or focal lesion. Adrenals/Urinary Tract: Adrenal glands are unremarkable. Kidneys are normal, without renal calculi, focal lesion, or hydronephrosis. Bladder is unremarkable. Stomach/Bowel: Stomach is unremarkable. Small  bowel normal in caliber. No wall thickening or inflammation. Mild dilation of the right colon, maximum diameter 6 cm. There is fold thickening of the ascending and transverse colons, most evident along the ascending colon. No wall thickening. Vascular/Lymphatic: There are numerous venous collaterals in the upper abdomen. Nonocclusive thrombus lies in the splenic vein extending from the portal splenic confluence. Portal and superior mesenteric veins are widely patent. Bilateral vessels are seen primarily along the coronary vein which joins numerous paraesophageal varices. Splenic vein collaterals connect with the left renal vein. There is aortic atherosclerosis.  No aneurysm. Reproductive: Endometrium appears distended. This may be from fluid or endometrial thickening. This measures 2.8 cm in greatest anterior-posterior thickness. No adnexal masses. Other: Trace amount of ascites is seen adjacent to the liver and right colon. Trace amount ascites collects in the pelvic cul-de-sac. Musculoskeletal: Chronic compression fracture of L2. No  acute fracture. No osteoblastic or osteolytic lesions. IMPRESSION: CHEST CT 1. Small right pneumothorax. 2. Small to moderate size right pleural effusion. Right lower lobe consolidation with associated dependent atelectasis. The consolidation is consistent with pneumonia. 3. Mild dependent subsegmental atelectasis in the right upper lobe and left lower lobe. Trace left pleural effusion. 4. Aortic atherosclerosis. ABDOMEN AND PELVIS CT 1. Findings are consistent with portal venous hypertension. There is central volume loss of the liver suggesting cirrhosis as etiology. Spleen is enlarged, similar to its measurement on the recent prior ultrasound. There are numerous venous collaterals including prominent paraesophageal varices. Nonocclusive thrombus is noted in the central splenic vein. There is a trace amount of associated ascites. 2. Fold thickening of the transverse and ascending  colon. This is nonspecific. This is likely edema related to the portal venous hypertension. 3. Endometrial fluid versus a thickened endometrium. Recommend further assessment with pelvic ultrasound. 4. Aortic atherosclerosis. 5. No evidence of intraperitoneal or retroperitoneal hemorrhage. Electronically Signed   By: Lajean Manes M.D.   On: 02/10/2018 13:52   US Pelvic Complete With Transvaginal  Result Date: 02/01/2018 CLINICAL DATA:  Vaginal bleeding for 3 days EXAM: TRANSABDOMINAL AND TRANSVAGINAL ULTRASOUND OF PELVIS TECHNIQUE: Both transabdominal and transvaginal ultrasound examinations of the pelvis were performed. Transabdominal technique was performed for global imaging of the pelvis including uterus, ovaries, adnexal regions, and pelvic cul-de-sac. It was necessary to proceed with endovaginal exam following the transabdominal exam to visualize the uterus, endometrium, and ovaries. Transvaginal imaging was limited by patient discomfort. COMPARISON:  None FINDINGS: Uterus Measurements: 5.9 x 3.3 x 3.7 cm. Retroverted. Normal morphology without mass Endometrium Thickness: 16 mm. Abnormal appearing heterogeneous endometrial complex. No focal mass lesion delineated. Right ovary Not visualized on either transabdominal or endovaginal imaging, likely obscured by bowel Left ovary Not visualized on either transabdominal or endovaginal imaging, likely obscured by bowel Other findings No abnormal free fluid. IMPRESSION: Nonvisualization of ovaries. Abnormal appearing heterogeneous endometrial complex 16 mm thick; if bleeding remains unresponsive to hormonal or medical therapy, focal lesion work-up with sonohysterogram should be considered. Endometrial biopsy should also be considered in pre-menopausal patients at high risk for endometrial carcinoma. (Ref: Radiological Reasoning: Algorithmic Workup of Abnormal Vaginal Bleeding with Endovaginal Sonography and Sonohysterography. AJR 2008; 656:C12-75) Electronically  Signed   By: Lavonia Dana M.D.   On: 02/01/2018 16:36   US Thoracentesis Asp Pleural Space W/img Guide  Result Date: 02/09/2018 CLINICAL DATA:  Large right pleural effusion. EXAM: ULTRASOUND GUIDED RIGHT THORACENTESIS COMPARISON:  None. PROCEDURE: An ultrasound guided thoracentesis was thoroughly discussed with the patient and questions answered. The benefits, risks, alternatives and complications were also discussed. The patient understands and wishes to proceed with the procedure. Written consent was obtained. A time-out was performed prior to initiating the procedure. Ultrasound was performed to localize and mark an adequate pocket of fluid in the right chest. The area was then prepped and draped in the normal sterile fashion. 1% Lidocaine was used for local anesthesia. Under ultrasound guidance a 6 French Safe-T-Centesis catheter was introduced. Thoracentesis was performed. The catheter was removed and a dressing applied. COMPLICATIONS: None FINDINGS: A total of approximately 2.1 L of clear, yellow fluid was removed. A fluid sample was sent for laboratory analysis. IMPRESSION: Successful ultrasound guided right thoracentesis yielding 2.1 L of pleural fluid. Electronically Signed   By: Aletta Edouard M.D.   On: 02/09/2018 16:05     CBC Recent Labs  Lab 02/09/18 1029 02/09/18 2139 02/10/18 0453  WBC 3.4*  --  2.8*  HGB 12.6  --  10.2*  HCT 38.9  --  31.5*  PLT 68* 88* 82*  MCV 92.2  --  92.6  MCH 29.9  --  30.0  MCHC 32.4  --  32.4  RDW 17.2*  --  17.2*  LYMPHSABS 0.6*  --   --   MONOABS 0.3  --   --   EOSABS 0.1  --   --   BASOSABS 0.0  --   --     Chemistries  Recent Labs  Lab 02/09/18 1029  NA 141  K 3.8  CL 109  CO2 24  GLUCOSE 112*  BUN 10  CREATININE 0.68  CALCIUM 8.5*  AST 32  ALT 19  ALKPHOS 82  BILITOT 1.5*   ------------------------------------------------------------------------------------------------------------------ estimated creatinine clearance is 46.6  mL/min (by C-G formula based on SCr of 0.68 mg/dL). ------------------------------------------------------------------------------------------------------------------ No results for input(s): HGBA1C in the last 72 hours. ------------------------------------------------------------------------------------------------------------------ No results for input(s): CHOL, HDL, LDLCALC, TRIG, CHOLHDL, LDLDIRECT in the last 72 hours. ------------------------------------------------------------------------------------------------------------------ No results for input(s): TSH, T4TOTAL, T3FREE, THYROIDAB in the last 72 hours.  Invalid input(s): FREET3 ------------------------------------------------------------------------------------------------------------------ No results for input(s): VITAMINB12, FOLATE, FERRITIN, TIBC, IRON, RETICCTPCT in the last 72 hours.  Coagulation profile Recent Labs  Lab 02/09/18 1422  INR 1.39    No results for input(s): DDIMER in the last 72 hours.  Cardiac Enzymes Recent Labs  Lab 02/09/18 1029  TROPONINI <0.03   ------------------------------------------------------------------------------------------------------------------ Invalid input(s): POCBNP    Assessment & Plan   *Acute large right pleural effusion This is related to patient's liver cirrhosis No fluid was sent I will order CT scan of the chest and abdomen    *Chronic thrombocytopenia Stable Thought to be due to sequestration from splenomegaly-followed by Dr. Berniece Pap Plan of care as stated above  *Recent acute vaginal bleeding Discharge within the last week, was due to chronic thrombocytopenia Resolved  *Chronic hypothyroidism, unspecified Continue Synthroid  *Chronic GERD Stable PPI daily  *Chronic depression Stable Continue home psychotropic regiment  *Chronic cirrhosis Stable Continue rifaximin  *Chronic severe protein calorie malnutrition Stable Continue  meal supplementation         Code Status Orders  (From admission, onward)         Start     Ordered   02/09/18 1421  Full code  Continuous     02/09/18 1421        Code Status History    Date Active Date Inactive Code Status Order ID Comments User Context   02/01/2018 2221 02/05/2018 1815 Full Code [id number]  Demetrios Loll, MD Inpatient   11/20/2017 1648 11/24/2017 1647 Full Code [id number]  Gorden Harms, MD Inpatient   06/20/2016 2332 07/04/2016 1956 Full Code [id number]  Clapacs, Madie Reno, MD Inpatient    Advance Directive Documentation     Most Recent Value  Type of Advance Directive  Healthcare Power of Attorney  Pre-existing out of facility DNR order (yellow form or pink MOST form)  -  "MOST" Form in Place?  -           Consults  none  DVT Prophylaxis SCD  Lab Results  Component Value Date   PLT 82 (L) 02/10/2018     Time Spent in minutes   35 minutes  Greater than 50% of time spent in care coordination and counseling patient regarding the condition and plan of care.   Dustin Flock M.D on 02/11/2018 at 9:11 AM  Between 7am to 6pm - Pager -  [phone]  After 6pm go to [email or web address] - Proofreader  [employer]   Office  [phone]

## 2018-02-11 NOTE — Care Management Note (Signed)
Case Management Note  Patient Details  Name: Cassidy [name] MRN: [id number] Date of Birth: 08-29-1940  Subjective/Objective: Spoke with patients sister, [name]. She will be taking patient home to her house at [address] [place], [place] [address]. She is requesting a wheelchair. Patient has one that is broken and her sister states patient uses it often for transportation and movement in the home. Ordered from [place] with [facility].  Sister is agreeable to [facility] since they have used them in the past. Referral to [place] with [facility] for RN and PT.                    Action/Plan:   Expected Discharge Date:  02/11/18               Expected Discharge Plan:  [place]  In-House Referral:     Discharge planning Services  CM Consult  Post Acute Care Choice:  Durable Medical Equipment, Home Health Choice offered to:  Sibling  DME Arranged:  Wheelchair manual DME Agency:  [place]:  RN, PT [facility] Agency:  [facility]  Status of Service:  Completed, signed off  If discussed at [place] of Stay Meetings, dates discussed:    Additional Comments:  Jolly Mango, RN 02/11/2018, 2:43 PM

## 2018-02-11 NOTE — Care Management (Deleted)
Patient suffers from severe shortness of breath and liver cirrohsis which impairs her ability to perform daily activities like bathing, dressing and ambulating in the home.  A walker will not resolve the issue with performing activities of daily living. A wheelchair will allow patient to safely perform daily activities. Patient can safely propel the wheelchair in the home or has a caregiver who can provide assistance.

## 2018-02-11 NOTE — Progress Notes (Addendum)
Pt is being discharged home with [facility].  Discharge papers given and explained to pt and pt's sister, [name].  Pt's sister verbalized understanding.  Meds and f/u appointments reviewed.  Rx to be picked up from pharmacy.

## 2018-02-12 ENCOUNTER — Other Ambulatory Visit: Payer: Self-pay | Admitting: Internal Medicine

## 2018-02-12 NOTE — Op Note (Signed)
Operative Report Hysteroscopy, Dilation and Curettage 02/04/2018  Patient:  Cassidy [name]  77 y.o. female Preoperative diagnosis:  THICKENED ENDOMETRIUM, POST MENOPAUSAL BLEEDING Postoperative diagnosis:  THICKENED ENDOMETRIUM, POST MENOPAUSAL BLEEDING  PROCEDURE:  Procedure(s): DILATATION & CURETTAGE/HYSTEROSCOPY WITH MYOSURE (N/A) Surgeon:  Surgeon(s) and Role:    * Clever Geraldo, Honor Loh, MD - Primary Anesthesia:  LMA I/O: 300cc crystalloid, no UOP, minimal blood loss Specimens:  Endometrial curettings Complications: None Apparent Disposition:  VS stable to PACU  Findings: Uterus, mobile, normal size, sounding to 7cm; normal cervix, vagina, perineum.  Hysteroscopy:  Fluffy endometrium anterior and posteriorly.  No overt polyp.  Indication for procedure/Consents: 77 y.o.  here for scheduled surgery for the aforementioned diagnoses. Risks of surgery were discussed with the patient including but not limited to: bleeding which may require transfusion; infection which may require antibiotics; injury to uterus or surrounding organs; intrauterine scarring which may impair future fertility; need for additional procedures including laparotomy or laparoscopy; and other postoperative/anesthesia complications. Written informed consent was obtained.    Procedure Details:   The patient was then taken to the operating room where anesthesia was administered and was found to be adequate.  After a formal timeout was performed, she was placed in the dorsal lithotomy position and examined with the above findings. She was then prepped and draped in the sterile manner.  A speculum was then placed in the patient's vagina and a single tooth tenaculum was applied to the anterior lip of the cervix.  The uterus was sounded to 7cm. Her cervix was serially dilated to accommodate the myoscope, with findings as above. A sharp curettage was then performed but no gritty texture was felt anteriorly or posteriorly. The myosure  was then deployed to eliminate the tissues superiorly and inferiorly.  The specimens were handed off to nursing. The tenaculum was removed from the anterior lip of the cervix and the vaginal speculum was removed after noting good hemostasis. The patient tolerated the procedure well and was taken to the recovery area awake, extubated and in stable condition.  The patient will be discharged to home as per PACU criteria.  Routine postoperative instructions given. She will follow up in the clinic in two to four weeks for postoperative evaluation.  Larey Days, MD [facility] OBGYN Attending Gynecologist

## 2018-02-12 NOTE — Progress Notes (Signed)
Obstetric and Gynecology   Subjective  Patient doing well, no complaints, tolerating PO intake, tolerating pain with PO meds, ambulating without difficulty, voiding spontaneously.    Vaginal Bleeding has substantially lightened, no cramping or pain  Denies CP, SOB, F/C, N/V/D, or leg pain.  She is getting ready for discharge.  Objective  Objective:   Vitals:   02/04/18 1429 02/04/18 2019 02/05/18 0305 02/05/18 1422  BP: (!) 132/57 135/62 (!) 132/55 (!) 112/50  Pulse: 90 95 96 88  Resp:  17 17 20   Temp: 98.3 F (36.8 C) 98.4 F (36.9 C) 98.2 F (36.8 C) 98 F (36.7 C)  TempSrc: Oral Oral Oral Oral  SpO2: 97% 94% 94% 94%  Weight:      Height:       General: NAD Cardiovascular: RRR, no murmurs Pulmonary: CTAB, normal respiratory effort Abdomen: Benign. Non-tender, +BS, no guarding.   Extremities: No erythema or cords, no calf tenderness, with normal peripheral pulses.    Assessment   77 y.o. Hospital Day: 5 with chronic thrombocytopenia, and vaginal bleeding s/p D&C hysteroscopy after platelet transfusion  Plan   1. Improved after D&C, no side effects from procedure.   2. D/C home and follow up with me in about 2-3 weeks for pathology review.  If malignancy is noted will call sooner   ----- Larey Days, MD Attending Obstetrician and Gynecologist [facility], Department of OB/GYN [facility]  30 minutes were spent caring for this patient, including >50% face to face and the remainder reviewing results, coordinating with consulting providers, and writing this note.

## 2018-02-13 ENCOUNTER — Inpatient Hospital Stay
Admission: EM | Admit: 2018-02-13 | Discharge: 2018-02-18 | DRG: 186 | Disposition: A | Discharge status: Home Health | Payer: Medicare Other | Attending: Internal Medicine | Admitting: Internal Medicine

## 2018-02-13 ENCOUNTER — Emergency Department: Payer: Medicare Other

## 2018-02-13 ENCOUNTER — Encounter: Payer: Self-pay | Admitting: Emergency Medicine

## 2018-02-13 ENCOUNTER — Other Ambulatory Visit: Payer: Self-pay

## 2018-02-13 DIAGNOSIS — K219 Gastro-esophageal reflux disease without esophagitis: Secondary | ICD-10-CM | POA: Diagnosis present

## 2018-02-13 DIAGNOSIS — J90 Pleural effusion, not elsewhere classified: Secondary | ICD-10-CM

## 2018-02-13 DIAGNOSIS — I82890 Acute embolism and thrombosis of other specified veins: Secondary | ICD-10-CM | POA: Diagnosis present

## 2018-02-13 DIAGNOSIS — J189 Pneumonia, unspecified organism: Secondary | ICD-10-CM | POA: Diagnosis present

## 2018-02-13 DIAGNOSIS — F419 Anxiety disorder, unspecified: Secondary | ICD-10-CM | POA: Diagnosis present

## 2018-02-13 DIAGNOSIS — K766 Portal hypertension: Secondary | ICD-10-CM | POA: Diagnosis present

## 2018-02-13 DIAGNOSIS — K760 Fatty (change of) liver, not elsewhere classified: Secondary | ICD-10-CM | POA: Diagnosis present

## 2018-02-13 DIAGNOSIS — F329 Major depressive disorder, single episode, unspecified: Secondary | ICD-10-CM | POA: Diagnosis present

## 2018-02-13 DIAGNOSIS — Z79899 Other long term (current) drug therapy: Secondary | ICD-10-CM | POA: Diagnosis not present

## 2018-02-13 DIAGNOSIS — Z6823 Body mass index (BMI) 23.0-23.9, adult: Secondary | ICD-10-CM | POA: Diagnosis not present

## 2018-02-13 DIAGNOSIS — K7460 Unspecified cirrhosis of liver: Secondary | ICD-10-CM | POA: Diagnosis present

## 2018-02-13 DIAGNOSIS — D696 Thrombocytopenia, unspecified: Secondary | ICD-10-CM | POA: Diagnosis present

## 2018-02-13 DIAGNOSIS — K7469 Other cirrhosis of liver: Secondary | ICD-10-CM | POA: Diagnosis not present

## 2018-02-13 DIAGNOSIS — E039 Hypothyroidism, unspecified: Secondary | ICD-10-CM | POA: Diagnosis present

## 2018-02-13 DIAGNOSIS — Z9889 Other specified postprocedural states: Secondary | ICD-10-CM

## 2018-02-13 DIAGNOSIS — Z882 Allergy status to sulfonamides status: Secondary | ICD-10-CM | POA: Diagnosis not present

## 2018-02-13 DIAGNOSIS — J939 Pneumothorax, unspecified: Secondary | ICD-10-CM

## 2018-02-13 DIAGNOSIS — Z66 Do not resuscitate: Secondary | ICD-10-CM | POA: Diagnosis present

## 2018-02-13 DIAGNOSIS — J918 Pleural effusion in other conditions classified elsewhere: Secondary | ICD-10-CM | POA: Diagnosis present

## 2018-02-13 DIAGNOSIS — J948 Other specified pleural conditions: Secondary | ICD-10-CM | POA: Diagnosis not present

## 2018-02-13 DIAGNOSIS — K7290 Hepatic failure, unspecified without coma: Secondary | ICD-10-CM | POA: Diagnosis not present

## 2018-02-13 DIAGNOSIS — J9811 Atelectasis: Secondary | ICD-10-CM | POA: Diagnosis present

## 2018-02-13 DIAGNOSIS — D61818 Other pancytopenia: Secondary | ICD-10-CM | POA: Diagnosis present

## 2018-02-13 DIAGNOSIS — R0600 Dyspnea, unspecified: Secondary | ICD-10-CM

## 2018-02-13 DIAGNOSIS — E43 Unspecified severe protein-calorie malnutrition: Secondary | ICD-10-CM | POA: Diagnosis present

## 2018-02-13 DIAGNOSIS — K769 Liver disease, unspecified: Secondary | ICD-10-CM | POA: Diagnosis not present

## 2018-02-13 HISTORY — DX: Pleural effusion, not elsewhere classified: J90

## 2018-02-13 LAB — BASIC METABOLIC PANEL
Anion gap: 8 (ref 5–15)
BUN: 10 mg/dL (ref 8–23)
CALCIUM: 8.3 mg/dL — AB (ref 8.9–10.3)
CO2: 22 mmol/L (ref 22–32)
Chloride: 112 mmol/L — ABNORMAL HIGH (ref 98–111)
Creatinine, Ser: 0.53 mg/dL (ref 0.44–1.00)
GFR calc non Af Amer: >60 mL/min (ref >60)
Glucose, Bld: 105 mg/dL — ABNORMAL HIGH (ref 70–99)
Potassium: 3.7 mmol/L (ref 3.5–5.1)
SODIUM: 142 mmol/L (ref 135–145)

## 2018-02-13 LAB — CBC
HCT: 38.1 % (ref 36.0–46.0)
Hemoglobin: 12.3 g/dL (ref 12.0–15.0)
MCH: 29.9 pg (ref 26.0–34.0)
MCHC: 32.3 g/dL (ref 30.0–36.0)
MCV: 92.5 fL (ref 80.0–100.0)
NRBC: 0 % (ref 0.0–0.2)
PLATELETS: 81 10*3/uL — AB (ref 150–400)
RBC: 4.12 MIL/uL (ref 3.87–5.11)
RDW: 17.2 % — AB (ref 11.5–15.5)
WBC: 3.9 10*3/uL — AB (ref 4.0–10.5)

## 2018-02-13 LAB — PROTIME-INR
INR: 1.22
Prothrombin Time: 15.3 seconds — ABNORMAL HIGH (ref 11.4–15.2)

## 2018-02-13 LAB — TROPONIN I: Troponin I: <0.03 ng/mL (ref <0.03)

## 2018-02-13 LAB — PROCALCITONIN: Procalcitonin: <0.1 ng/mL

## 2018-02-13 MED ORDER — LEVOTHYROXINE SODIUM 100 MCG PO TABS
100.0000 ug | ORAL_TABLET | Freq: Every day | ORAL | Status: DC
  Administered 2018-02-14 – 2018-02-18 (×5): 100 ug via ORAL
  Filled 2018-02-13 (×5): qty 1

## 2018-02-13 MED ORDER — CLONAZEPAM 0.5 MG PO TABS
0.5000 mg | ORAL_TABLET | Freq: Every day | ORAL | Status: DC | PRN
  Administered 2018-02-13 – 2018-02-15 (×3): 0.5 mg via ORAL
  Filled 2018-02-13 (×3): qty 1

## 2018-02-13 MED ORDER — ESCITALOPRAM OXALATE 10 MG PO TABS
10.0000 mg | ORAL_TABLET | Freq: Every day | ORAL | Status: DC
  Administered 2018-02-14 – 2018-02-18 (×5): 10 mg via ORAL
  Filled 2018-02-13 (×5): qty 1

## 2018-02-13 MED ORDER — ONDANSETRON HCL 4 MG PO TABS: 4.0000 mg | ORAL_TABLET | Freq: Four times a day (QID) | ORAL | Status: DC | PRN

## 2018-02-13 MED ORDER — BUSPIRONE HCL 15 MG PO TABS
15.0000 mg | ORAL_TABLET | Freq: Every day | ORAL | Status: DC
  Administered 2018-02-13 – 2018-02-17 (×5): 15 mg via ORAL
  Filled 2018-02-13 (×6): qty 1

## 2018-02-13 MED ORDER — POLYETHYLENE GLYCOL 3350 17 G PO PACK: 17.0000 g | PACK | Freq: Every day | ORAL | Status: DC | PRN

## 2018-02-13 MED ORDER — LEVOFLOXACIN 500 MG PO TABS: 500.0000 mg | ORAL_TABLET | Freq: Every day | ORAL | Status: DC

## 2018-02-13 MED ORDER — RIFAXIMIN 550 MG PO TABS
550.0000 mg | ORAL_TABLET | Freq: Two times a day (BID) | ORAL | Status: DC
  Administered 2018-02-13 – 2018-02-18 (×10): 550 mg via ORAL
  Filled 2018-02-13 (×11): qty 1

## 2018-02-13 MED ORDER — QUETIAPINE FUMARATE 25 MG PO TABS
100.0000 mg | ORAL_TABLET | Freq: Every day | ORAL | Status: DC
  Administered 2018-02-13 – 2018-02-17 (×5): 100 mg via ORAL
  Filled 2018-02-13 (×5): qty 4

## 2018-02-13 MED ORDER — LORAZEPAM 2 MG/ML IJ SOLN
1.0000 mg | Freq: Once | INTRAMUSCULAR | Status: AC | PRN
  Administered 2018-02-15: 1 mg via INTRAVENOUS
  Filled 2018-02-13: qty 1

## 2018-02-13 MED ORDER — TRAZODONE HCL 50 MG PO TABS
150.0000 mg | ORAL_TABLET | Freq: Every day | ORAL | Status: DC
  Administered 2018-02-13 – 2018-02-17 (×5): 150 mg via ORAL
  Filled 2018-02-13 (×5): qty 1

## 2018-02-13 MED ORDER — ONDANSETRON HCL 4 MG/2ML IJ SOLN
4.0000 mg | Freq: Four times a day (QID) | INTRAMUSCULAR | Status: DC | PRN
  Administered 2018-02-18: 4 mg via INTRAVENOUS
  Filled 2018-02-13: qty 2

## 2018-02-13 MED ORDER — MEGESTROL ACETATE 20 MG PO TABS
20.0000 mg | ORAL_TABLET | Freq: Two times a day (BID) | ORAL | Status: DC
  Administered 2018-02-13 – 2018-02-16 (×6): 20 mg via ORAL
  Filled 2018-02-13 (×7): qty 1

## 2018-02-13 MED ORDER — LEVOFLOXACIN 250 MG PO TABS
250.0000 mg | ORAL_TABLET | Freq: Every day | ORAL | Status: DC
Start: 2018-02-14 — End: 2018-02-17
  Administered 2018-02-14 – 2018-02-17 (×4): 250 mg via ORAL
  Filled 2018-02-13 (×4): qty 1

## 2018-02-13 MED ORDER — MELATONIN 1 MG PO TABS
5.0000 mg | ORAL_TABLET | Freq: Every evening | ORAL | Status: DC | PRN
  Administered 2018-02-16: 5 mg via ORAL
  Filled 2018-02-13 (×2): qty 5

## 2018-02-13 MED ORDER — PANTOPRAZOLE SODIUM 40 MG PO TBEC
40.0000 mg | DELAYED_RELEASE_TABLET | Freq: Every day | ORAL | Status: DC
  Administered 2018-02-14 – 2018-02-18 (×5): 40 mg via ORAL
  Filled 2018-02-13 (×5): qty 1

## 2018-02-13 NOTE — ED Notes (Signed)
Assisted pt to bathroom

## 2018-02-13 NOTE — ED Notes (Signed)
Patient transported to X-ray 

## 2018-02-13 NOTE — ED Notes (Signed)
Updated family on reason for delay. No needs. Lights dimmed and pt resting.

## 2018-02-13 NOTE — ED Triage Notes (Signed)
PT arrived with complaints of "feeling weird" and being short of breath. Pt states this episode started this morning. Pt recently discharged from the hospital after diuresis.

## 2018-02-13 NOTE — Progress Notes (Signed)
   02/13/18 1845  Clinical Encounter Type  Visited With Patient and family together  Visit Type Initial  Referral From Nurse  Consult/Referral To Chaplain  Recommendations follow up Sunday 10/27   Chaplain responded to order for prayer and found patient's sister and other family at patient's bedside.  Sister expressed interest in advanced directives.  Chaplain offered education on document.  During conversation, patient stated that she couldn't remember what she had done yesterday.  Chaplain spoke to patient and family about necessity of patient's understanding in document completion and reviewed [place] law order of who can speak for patient when an HCPOA is not in place.  Patient then requested personal care and sister expressed desire for chaplain follow tomorrow (10/27).

## 2018-02-13 NOTE — H&P (Signed)
[place] [facility] [place] NAME: Cassidy [name]    MR#:  [id number]  DATE OF BIRTH:  09-24-1940  DATE OF ADMISSION:  02/13/2018  PRIMARY CARE PHYSICIAN: Kirk Ruths, MD   REQUESTING/REFERRING PHYSICIAN: [name] Roca, MD  CHIEF COMPLAINT:   Chief Complaint  Patient presents with  . Weakness  . Shortness of Breath    HISTORY OF PRESENT ILLNESS:  Cassidy [name]  is a 77 y.o. female with a known history of liver cirrhosis, hypothyroidism, anxiety, depression, chronic thrombocytopenia, who presented to the ED with progressively worsening shortness of breath that started a few hours ago.  Patient was just admitted from 10/22 to 10/24 for acute large right pleural effusion.  She underwent thoracentesis on 10/23.  No fluid analysis was sent at that time.  She had a CT chest/abdomen/pelvis that showed right lower lobe consolidation consistent with pneumonia.  She was discharged home on Levaquin for a total of 5 days.  She has taken 1 dose of this medication. She denies any fevers or chills.  She denies any lower extremity edema. She endorses cough.  In the ED, labs are unremarkable.  Chest x-ray showed reaccumulation of right pleural fluid with large volume right pleural effusion present.  Hospitalists were called for admission.  PAST MEDICAL HISTORY:   Past Medical History:  Diagnosis Date  . Anxiety   . Depression   . Fatty liver   . GERD (gastroesophageal reflux disease)   . Hypothyroidism   . Obesity   . Thyroid disease     PAST SURGICAL HISTORY:   Past Surgical History:  Procedure Laterality Date  . APPENDECTOMY    . DILATATION & CURETTAGE/HYSTEROSCOPY WITH MYOSURE N/A 02/04/2018   Procedure: DILATATION & CURETTAGE/HYSTEROSCOPY WITH MYOSURE;  Surgeon: Ward, Honor Loh, MD;  Location: [facility] ORS;  Service: Gynecology;  Laterality: N/A;  . ESOPHAGOGASTRODUODENOSCOPY (EGD) WITH PROPOFOL N/A 12/20/2014   Procedure:  ESOPHAGOGASTRODUODENOSCOPY (EGD) WITH PROPOFOL;  Surgeon: Manya Silvas, MD;  Location: [facility] ENDOSCOPY;  Service: Endoscopy;  Laterality: N/A;  . SAVORY DILATION N/A 12/20/2014   Procedure: SAVORY DILATION;  Surgeon: Manya Silvas, MD;  Location: [facility] ENDOSCOPY;  Service: Endoscopy;  Laterality: N/A;  . TONSILLECTOMY      SOCIAL HISTORY:   Social History   Tobacco Use  . Smoking status: Never Smoker  . Smokeless tobacco: Never Used  Substance Use Topics  . Alcohol use: No    FAMILY HISTORY:   Family History  Problem Relation Age of Onset  . Diabetes Mother   . COPD Mother   . Kidney disease Mother   . Depression Mother   . Hypertension Mother   . Heart attack Father   . Aneurysm Father   . Anxiety disorder Sister   . Depression Sister   . Diabetes Sister   . Hypertension Sister   . Atrial fibrillation Brother   . Spinal muscular atrophy Brother   . Breast cancer Sister   . Bone cancer Sister   . Depression Sister   . Anxiety disorder Sister   . Liver cancer Sister   . Breast cancer Sister   . Colon cancer Sister   . Depression Sister   . Diabetes Sister   . COPD Sister   . Depression Sister   . Anxiety disorder Sister   . Skin cancer Sister   . Diabetes Brother   . Depression Brother   . Skin cancer Maternal Aunt   . Diabetes Maternal  Aunt   . Cervical cancer Paternal Aunt     DRUG ALLERGIES:   Allergies  Allergen Reactions  . Sulfa Antibiotics Shortness Of Breath    REVIEW OF SYSTEMS:   Review of Systems  Constitutional: Negative for chills and fever.  HENT: Negative for congestion and sore throat.   Eyes: Negative for blurred vision and double vision.  Respiratory: Positive for cough and shortness of breath. Negative for sputum production.   Cardiovascular: Negative for chest pain, orthopnea and leg swelling.  Gastrointestinal: Negative for abdominal pain, nausea and vomiting.  Genitourinary: Negative for dysuria and frequency.    Musculoskeletal: Negative for back pain and neck pain.  Neurological: Negative for dizziness and headaches.  Psychiatric/Behavioral: Negative for depression. The patient is not nervous/anxious.     MEDICATIONS AT HOME:   Prior to Admission medications   Medication Sig Start Date End Date Taking? Authorizing Provider  acidophilus (RISAQUAD) CAPS capsule Take 1 capsule by mouth 2 (two) times daily.   Yes [provider]  busPIRone (BUSPAR) 15 MG tablet Take 1 tablet (15 mg total) by mouth at bedtime. 01/05/18  Yes Ursula Alert, MD  clonazePAM (KLONOPIN) 0.5 MG tablet Take 1 tablet (0.5 mg total) by mouth daily as needed for anxiety. Only for severe anxiety sx 01/05/18  Yes Ursula Alert, MD  Copper Gluconate 2 MG TABS Take 1 tablet (2 mg total) by mouth daily. 02/06/18  Yes Epifanio Lesches, MD  escitalopram (LEXAPRO) 10 MG tablet Take 1 tablet (10 mg total) by mouth daily. 01/05/18  Yes Ursula Alert, MD  levofloxacin (LEVAQUIN) 500 MG tablet Take 1 tablet (500 mg total) by mouth daily for 4 days. 02/11/18 02/15/18 Yes Dustin Flock, MD  levothyroxine (SYNTHROID, LEVOTHROID) 100 MCG tablet Take 1 tablet (100 mcg total) by mouth daily before breakfast. 11/25/17  Yes Mayo, Pete Pelt, MD  loperamide (IMODIUM) 2 MG capsule Take 2 mg by mouth as needed for diarrhea or loose stools.   Yes [provider]  megestrol (MEGACE) 20 MG tablet Take 1 tablet (20 mg total) by mouth 2 (two) times daily. 12/18/17  Yes Earlie Server, MD  Melatonin 1 MG TABS Take 5 mg by mouth at bedtime as needed (for sleep).    Yes [provider]  omeprazole (PRILOSEC) 40 MG capsule Take 1 capsule (40 mg total) by mouth daily. 06/19/17 06/19/18 Yes Earleen Newport, MD  ondansetron (ZOFRAN-ODT) 8 MG disintegrating tablet Take 8 mg by mouth every 8 (eight) hours as needed for nausea or vomiting.   Yes [provider]  potassium chloride (K-DUR) 10 MEQ tablet Take 10 mEq by mouth daily.    Yes [provider]  QUEtiapine (SEROQUEL) 100 MG tablet Take 1 tablet (100 mg total) by mouth at bedtime. 01/05/18  Yes Ursula Alert, MD  traZODone (DESYREL) 150 MG tablet Take 1 tablet (150 mg total) by mouth at bedtime. 01/05/18  Yes Ursula Alert, MD  traZODone (DESYREL) 50 MG tablet Take 1 tablet (50 mg total) by mouth at bedtime as needed for sleep. To be combined with 150 mg as needed 01/05/18  Yes Eappen, Ria Clock, MD  vitamin C (VITAMIN C) 250 MG tablet Take 1 tablet (250 mg total) by mouth 2 (two) times daily. 02/05/18  Yes Epifanio Lesches, MD  XIFAXAN 550 MG TABS tablet Take 1 tablet by mouth 2 (two) times daily. 10/21/17  Yes [provider]      VITAL SIGNS:  Blood pressure (!) 150/76, pulse 98, temperature 98.6  F (37 C), temperature source Oral, resp. rate (!) 22, height 5' 2"  (1.575 m), weight 59 kg, SpO2 91 %.  PHYSICAL EXAMINATION:  Physical Exam  GENERAL:  77 y.o.-year-old patient lying in the bed with no acute distress.  EYES: Pupils equal, round, reactive to light and accommodation. No scleral icterus. Extraocular muscles intact.  HEENT: Head atraumatic, normocephalic. Oropharynx and nasopharynx clear. Moist mucous membranes. NECK:  Supple, no jugular venous distention. No thyroid enlargement, no tenderness.  LUNGS: Decreased breath sounds throughout the entire right lung, no wheezing, rales,rhonchi or crepitation. No use of accessory muscles of respiration.  CARDIOVASCULAR: Tachycardic, regular rhythm, S1, S2 normal. No murmurs, rubs, or gallops.  ABDOMEN: Soft, nontender, nondistended. Bowel sounds present. No organomegaly or mass.  EXTREMITIES: No pedal edema, cyanosis, or clubbing.  NEUROLOGIC: Cranial nerves II through XII are intact. Muscle strength 5/5 in all extremities. Sensation intact. Gait not checked.  PSYCHIATRIC: The patient is alert and oriented x 3.  SKIN: No obvious rash, lesion, or ulcer.   LABORATORY PANEL:   CBC Recent  Labs  Lab 02/13/18 1418  WBC 3.9*  HGB 12.3  HCT 38.1  PLT 81*   ------------------------------------------------------------------------------------------------------------------  Chemistries  Recent Labs  Lab 02/09/18 1029 02/13/18 1418  NA 141 142  K 3.8 3.7  CL 109 112*  CO2 24 22  GLUCOSE 112* 105*  BUN 10 10  CREATININE 0.68 0.53  CALCIUM 8.5* 8.3*  AST 32  --   ALT 19  --   ALKPHOS 82  --   BILITOT 1.5*  --    ------------------------------------------------------------------------------------------------------------------  Cardiac Enzymes Recent Labs  Lab 02/13/18 1418  TROPONINI <0.03   ------------------------------------------------------------------------------------------------------------------  RADIOLOGY:  Dg Chest 2 View  Result Date: 02/13/2018 CLINICAL DATA:  Shortness of breath. Status post right thoracentesis on 02/09/2018. EXAM: CHEST - 2 VIEW COMPARISON:  02/11/2018 and 02/09/2018. FINDINGS: The heart size and mediastinal contours are within normal limits. There is further reaccumulation of right pleural fluid with large volume pleural effusion now present. Volume is fairly similar to the chest x-ray on 10/22 just before thoracentesis. Component of interstitial edema suspected. No left-sided pleural fluid. No pneumothorax. The visualized skeletal structures are unremarkable. IMPRESSION: Further reaccumulation of right pleural fluid with large volume right pleural effusion present. Volume of fluid similar to chest x-ray on 10/22 prior to thoracentesis. Electronically Signed   By: Aletta Edouard M.D.   On: 02/13/2018 14:57      IMPRESSION AND PLAN:   Recurrent large right pleural effusion- likely related to liver cirrhosis vs CAP.  She is s/p thoracentesis on 10/23. Currently on room air, but becomes very short of breath with exertion -Repeat thoracentesis with fluid studies -Continuous pulse ox  CAP-diagnosed by CT chest at last admission and  discharged home on a 5-day course of Levaquin. Procalcitonin <0.10 on 02/10/18.  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 Received 2 units of platelets at last hospitalization.  Follows with Dr. Tasia Catchings as an outpatient -Monitor   History of vaginal bleeding- related to thrombocytopenia, no recent bleeding, hgb stable. -Continue home Megace  DVT prophylaxis- SCDs  All the records are reviewed and case discussed with ED provider. Management plans discussed with the patient, family and they are in agreement.  CODE STATUS: full  TOTAL TIME  TAKING CARE OF THIS PATIENT: 45 minutes.    Berna Spare Mayo M.D on 02/13/2018 at 3:31 PM  Between 7am to 6pm - Pager - [phone]  After 6pm go to [email or web address] - Technical brewer  Hospitalists  Office  [phone]  CC: Primary care physician; Kirk Ruths, MD   Note: This dictation was prepared with Dragon dictation along with smaller phrase technology. Any transcriptional errors that result from this process are unintentional.

## 2018-02-13 NOTE — Progress Notes (Signed)
Family Meeting Note  Advance Directive:yes  Today a meeting took place with the Patient.  Patient is able to participate.  The following clinical team members were present during this meeting:MD  The following were discussed:Patient's diagnosis: , Patient's progosis: Unable to determine and Goals for treatment: Full Code  Discussed patient's recurrent pleural effusion. Patient states that she has thought a lot about code status and has discussed this with her sister (HCPOA). She would like everything done to save her life, including chest compressions and intubation.  Additional follow-up to be provided: prn  Time spent during discussion:20 minutes  Evette Doffing, MD

## 2018-02-13 NOTE — ED Provider Notes (Signed)
[facility] Emergency Department Provider Note ____________________________________________   I have reviewed the triage vital signs and the triage nursing note.  HISTORY  Chief Complaint Weakness and Shortness of Breath   Historian Patient  HPI Cassidy [name] is a 77 y.o. female with a history of recent discharge from the hospital 2 days ago with a history of chronic thrombus cytopenia, shortness of breath due to right pleural effusion and history of liver cirrhosis, status post  thoracentesis, resents today with complaint of not feeling right and some shortness of breath.  No fever.  On antibiotic for "possible pneumonia" per family.  States shob was better after thoracentesis, now back.  No chest pain.  Symptoms moderate.    Past Medical History:  Diagnosis Date  . Anxiety   . Depression   . Fatty liver   . GERD (gastroesophageal reflux disease)   . Hypothyroidism   . Obesity   . Thyroid disease     Patient Active Problem List   Diagnosis Date Noted  . Pleural effusion 02/09/2018  . Protein-calorie malnutrition, severe 02/04/2018  . Abnormal vaginal bleeding   . Symptomatic anemia 02/03/2018  . Anemia 02/01/2018  . Leukopenia 12/01/2017  . Dysphagia 11/21/2017  . Emesis, persistent 11/20/2017  . Severe recurrent major depression without psychotic features ([place]) 06/19/2016  . Hepatic encephalopathy ([place]) 08/13/2015  . Thrombocytopenia ([place]) 07/23/2015  . Amnesia 03/20/2015  . Major depression in remission ([place]) 03/12/2015  . Gonalgia 01/05/2015  . Absolute anemia 11/27/2014  . Arthritis 11/27/2014  . Acid reflux 11/27/2014  . Depression, major, recurrent, in partial remission ([place]) 11/27/2014  . Encounter for general adult medical examination without abnormal findings 08/25/2014  . Chronic LBP 08/15/2014  . Complete rotator cuff rupture of left shoulder 05/01/2014  . Infraspinatus tenosynovitis 05/01/2014  . Other synovitis and  tenosynovitis, right shoulder 05/01/2014  . Difficulty in walking 11/30/2013  . Extreme obesity 11/30/2013  . Morbid obesity ([place]) 11/30/2013  . Arthritis, degenerative 10/01/2013  . Steatohepatitis 10/01/2013  . Orthostasis 10/01/2013  . 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 Fall 09/26/2013  . Dizziness 09/07/2013  . Osteoporosis with fracture 09/06/2013  . Clinical depression 03/25/2012  . Adult hypothyroidism 03/25/2012  . Esophageal stenosis 07/03/2009  . Back ache 03/08/2003  . Anxiety state 12/27/2002    Past Surgical History:  Procedure Laterality Date  . APPENDECTOMY    . DILATATION & CURETTAGE/HYSTEROSCOPY WITH MYOSURE N/A 02/04/2018   Procedure: DILATATION & CURETTAGE/HYSTEROSCOPY WITH MYOSURE;  Surgeon: Ward, Honor Loh, MD;  Location: [facility] ORS;  Service: Gynecology;  Laterality: N/A;  . ESOPHAGOGASTRODUODENOSCOPY (EGD) WITH PROPOFOL N/A 12/20/2014   Procedure: ESOPHAGOGASTRODUODENOSCOPY (EGD) WITH PROPOFOL;  Surgeon: Manya Silvas, MD;  Location: [facility] ENDOSCOPY;  Service: Endoscopy;  Laterality: N/A;  . SAVORY DILATION N/A 12/20/2014   Procedure: SAVORY DILATION;  Surgeon: Manya Silvas, MD;  Location: [facility] ENDOSCOPY;  Service: Endoscopy;  Laterality: N/A;  . TONSILLECTOMY      Prior to Admission medications   Medication Sig Start Date End Date Taking? Authorizing Provider  acidophilus (RISAQUAD) CAPS capsule Take 1 capsule by mouth 2 (two) times daily.   Yes [provider]  busPIRone (BUSPAR) 15 MG tablet Take 1 tablet (15 mg total) by mouth at bedtime. 01/05/18  Yes Ursula Alert, MD  clonazePAM (KLONOPIN) 0.5 MG tablet Take 1 tablet (0.5 mg total) by mouth daily as needed for anxiety. Only for severe anxiety sx 01/05/18  Yes Ursula Alert, MD  Copper Gluconate 2 MG  TABS Take 1 tablet (2 mg total) by mouth daily. 02/06/18  Yes Epifanio Lesches, MD  escitalopram (LEXAPRO) 10 MG tablet Take 1 tablet (10 mg total) by mouth daily. 01/05/18  Yes Ursula Alert, MD  levofloxacin (LEVAQUIN) 500 MG tablet Take 1 tablet (500 mg total) by mouth daily for 4 days. 02/11/18 02/15/18 Yes Dustin Flock, MD  levothyroxine (SYNTHROID, LEVOTHROID) 100 MCG tablet Take 1 tablet (100 mcg total) by mouth daily before breakfast. 11/25/17  Yes Mayo, Pete Pelt, MD  loperamide (IMODIUM) 2 MG capsule Take 2 mg by mouth as needed for diarrhea or loose stools.   Yes [provider]  megestrol (MEGACE) 20 MG tablet Take 1 tablet (20 mg total) by mouth 2 (two) times daily. 12/18/17  Yes Earlie Server, MD  Melatonin 1 MG TABS Take 5 mg by mouth at bedtime as needed (for sleep).    Yes [provider]  omeprazole (PRILOSEC) 40 MG capsule Take 1 capsule (40 mg total) by mouth daily. 06/19/17 06/19/18 Yes Earleen Newport, MD  ondansetron (ZOFRAN-ODT) 8 MG disintegrating tablet Take 8 mg by mouth every 8 (eight) hours as needed for nausea or vomiting.   Yes [provider]  potassium chloride (K-DUR) 10 MEQ tablet Take 10 mEq by mouth daily.   Yes [provider]  QUEtiapine (SEROQUEL) 100 MG tablet Take 1 tablet (100 mg total) by mouth at bedtime. 01/05/18  Yes Ursula Alert, MD  traZODone (DESYREL) 150 MG tablet Take 1 tablet (150 mg total) by mouth at bedtime. 01/05/18  Yes Ursula Alert, MD  traZODone (DESYREL) 50 MG tablet Take 1 tablet (50 mg total) by mouth at bedtime as needed for sleep. To be combined with 150 mg as needed 01/05/18  Yes Eappen, Ria Clock, MD  vitamin C (VITAMIN C) 250 MG tablet Take 1 tablet (250 mg total) by mouth 2 (two) times daily. 02/05/18  Yes Epifanio Lesches, MD  XIFAXAN 550 MG TABS tablet Take 1 tablet by mouth 2 (two) times daily. 10/21/17  Yes [provider]    Allergies  Allergen Reactions  . Sulfa Antibiotics Shortness Of Breath    Family History  Problem Relation Age of Onset  . Diabetes Mother   . COPD Mother   . Kidney disease Mother   . Depression Mother   . Hypertension Mother   .  Heart attack Father   . Aneurysm Father   . Anxiety disorder Sister   . Depression Sister   . Diabetes Sister   . Hypertension Sister   . Atrial fibrillation Brother   . Spinal muscular atrophy Brother   . Breast cancer Sister   . Bone cancer Sister   . Depression Sister   . Anxiety disorder Sister   . Liver cancer Sister   . Breast cancer Sister   . Colon cancer Sister   . Depression Sister   . Diabetes Sister   . COPD Sister   . Depression Sister   . Anxiety disorder Sister   . Skin cancer Sister   . Diabetes Brother   . Depression Brother   . Skin cancer Maternal Aunt   . Diabetes Maternal Aunt   . Cervical cancer Paternal Aunt     Social History Social History   Tobacco Use  . Smoking status: Never Smoker  . Smokeless tobacco: Never Used  Substance Use Topics  . Alcohol use: No  . Drug use: No    Review of Systems  Constitutional: Negative for fever.  Eyes: Negative for visual changes. ENT: Negative for sore throat. Cardiovascular: Negative for chest pain. Respiratory: Negative for shortness of breath. Gastrointestinal: Negative for abdominal pain, vomiting and diarrhea. Genitourinary: Negative for dysuria. Musculoskeletal: Negative for back pain. Skin: Negative for rash. Neurological: Negative for headache.  ____________________________________________   PHYSICAL EXAM:  VITAL SIGNS: ED Triage Vitals  Enc Vitals Group     BP 02/13/18 1403 (!) 150/76     Pulse Rate 02/13/18 1403 98     Resp 02/13/18 1403 (!) 22     Temp 02/13/18 1403 98.6 F (37 C)     Temp Source 02/13/18 1403 Oral     SpO2 02/13/18 1403 91 %     Weight 02/13/18 1401 130 lb (59 kg)     Height 02/13/18 1401 5' 2"  (1.575 m)     Head Circumference --      Peak Flow --      Pain Score 02/13/18 1401 0     Pain Loc --      Pain Edu? --      Excl. in [place]? --      Constitutional: Alert and oriented.  HEENT      Head: Normocephalic and atraumatic.      Eyes: Conjunctivae are  normal. Pupils equal and round.       Ears:         Nose: No congestion/rhinnorhea.      Mouth/Throat: Mucous membranes are moist.      Neck: No stridor. Cardiovascular/Chest: Normal rate, regular rhythm.  No murmurs, rubs, or gallops. Respiratory: Normal respiratory effort without tachypnea nor retractions. Breath sounds are clear and equal bilaterally.  No wheeze.  Decreased breath sounds on the right. Gastrointestinal: Soft. No distention, no guarding, no rebound. Nontender. Genitourinary/rectal:Deferred Musculoskeletal: Nontender with normal range of motion in all extremities. No joint effusions.  No lower extremity tenderness.  No edema. Neurologic:  Normal speech and language. No gross or focal neurologic deficits are appreciated. Skin:  Skin is warm, dry and intact. No rash noted. Psychiatric: Mood and affect are normal. Speech and behavior are normal. Patient exhibits appropriate insight and judgment.   ____________________________________________  LABS (pertinent positives/negatives) I, [name] Roca, MD the attending physician have reviewed the labs noted below.  Labs Reviewed  BASIC METABOLIC PANEL - Abnormal; Notable for the following components:      Result Value   Chloride 112 (*)    Glucose, Bld 105 (*)    Calcium 8.3 (*)    All other components within normal limits  CBC - Abnormal; Notable for the following components:   WBC 3.9 (*)    RDW 17.2 (*)    Platelets 81 (*)    All other components within normal limits  TROPONIN I    ____________________________________________    EKG I, [name] Roca, MD, the attending physician have personally viewed and interpreted all ECGs.  98 bpm.  Normal sinus rhythm with PACs.  Narrow QS.  Normal axis.  Nonspecific T wave ____________________________________________  RADIOLOGY   X-ray two-view, viewed by myself, radiologist report reviewed:  Large right pleural effusion, larger than  prior. __________________________________________  PROCEDURES  Procedure(s) performed: None  Procedures  Critical Care performed: None   ____________________________________________  ED COURSE / ASSESSMENT AND PLAN  Pertinent labs & imaging results that were available during my care of the patient were reviewed by me and considered in my medical decision making (see chart for details).     93% at rest on room  air, patient states that she feels short of breath especially when lying flat.  This is worsened when she left the hospitalization just a day and half ago when she was found to have pleural effusion had thoracentesis.  I reviewed the prior discharge summary, states pleural effusion likely due to liver disease.  In any case, x-ray today shows larger pleural effusion, more than half of the right lung.  No additional symptoms for pneumonia.  However given significant shortness of breath and now effusion worse than previous, will discuss with hospitalist for admission.    CONSULTATIONS: Hospitalist for admission   Patient / Family / Caregiver informed of clinical course, medical decision-making process, and agree with plan.    ___________________________________________   FINAL CLINICAL IMPRESSION(S) / ED DIAGNOSES   Final diagnoses:  Dyspnea, unspecified type  Pleural effusion on right      ___________________________________________         Note: This dictation was prepared with Dragon dictation. Any transcriptional errors that result from this process are unintentional    [name] Roca, MD 02/13/18 1505

## 2018-02-14 LAB — CBC
HCT: 34.9 % — ABNORMAL LOW (ref 36.0–46.0)
HEMOGLOBIN: 11.1 g/dL — AB (ref 12.0–15.0)
MCH: 29.6 pg (ref 26.0–34.0)
MCHC: 31.8 g/dL (ref 30.0–36.0)
MCV: 93.1 fL (ref 80.0–100.0)
Platelets: 69 10*3/uL — ABNORMAL LOW (ref 150–400)
RBC: 3.75 MIL/uL — ABNORMAL LOW (ref 3.87–5.11)
RDW: 17.3 % — ABNORMAL HIGH (ref 11.5–15.5)
WBC: 2.7 10*3/uL — ABNORMAL LOW (ref 4.0–10.5)
nRBC: 0 % (ref 0.0–0.2)

## 2018-02-14 LAB — BASIC METABOLIC PANEL
Anion gap: 6 (ref 5–15)
BUN: 12 mg/dL (ref 8–23)
CHLORIDE: 112 mmol/L — AB (ref 98–111)
CO2: 24 mmol/L (ref 22–32)
Calcium: 8.2 mg/dL — ABNORMAL LOW (ref 8.9–10.3)
Creatinine, Ser: 0.55 mg/dL (ref 0.44–1.00)
GFR calc Af Amer: >60 mL/min (ref >60)
GLUCOSE: 95 mg/dL (ref 70–99)
POTASSIUM: 3.7 mmol/L (ref 3.5–5.1)
Sodium: 142 mmol/L (ref 135–145)

## 2018-02-14 NOTE — Plan of Care (Signed)
The patient has been stable on 2L of oxygen. No falls. Family at bedside. Bed alarm on. Awaiting for thoracenteses.   Problem: Spiritual Needs Goal: Ability to function at adequate level Outcome: Progressing   Problem: Education: Goal: Knowledge of General Education information will improve Description Including pain rating scale, medication(s)/side effects and non-pharmacologic comfort measures Outcome: Progressing   Problem: Health Behavior/Discharge Planning: Goal: Ability to manage health-related needs will improve Outcome: Progressing   Problem: Clinical Measurements: Goal: Ability to maintain clinical measurements within normal limits will improve Outcome: Progressing Goal: Will remain free from infection Outcome: Progressing Goal: Diagnostic test results will improve Outcome: Progressing Goal: Respiratory complications will improve Outcome: Progressing Goal: Cardiovascular complication will be avoided Outcome: Progressing   Problem: Activity: Goal: Risk for activity intolerance will decrease Outcome: Progressing   Problem: Nutrition: Goal: Adequate nutrition will be maintained Outcome: Progressing   Problem: Coping: Goal: Level of anxiety will decrease Outcome: Progressing   Problem: Elimination: Goal: Will not experience complications related to bowel motility Outcome: Progressing Goal: Will not experience complications related to urinary retention Outcome: Progressing   Problem: Pain Managment: Goal: General experience of comfort will improve Outcome: Progressing   Problem: Safety: Goal: Ability to remain free from injury will improve Outcome: Progressing   Problem: Skin Integrity: Goal: Risk for impaired skin integrity will decrease Outcome: Progressing

## 2018-02-14 NOTE — Progress Notes (Signed)
[facility] [place] NAME: Cassidy [name]    MR#:  [id number]  DATE OF BIRTH:  1940-06-29  SUBJECTIVE:   Patient here for increased work of breathing and shortness of breath.  She was recently discharged from the hospital with pleural effusion status post thoracentesis and pneumonia.  She has been taking her antibiotics.  She does not drink EtOH.  She has a history of fatty liver.  She was recently diagnosed with possible cirrhosis and has outpatient follow-up with GI.  REVIEW OF SYSTEMS:    Review of Systems  Constitutional: Negative for fever, chills weight loss HENT: Negative for ear pain, nosebleeds, congestion, facial swelling, rhinorrhea, neck pain, neck stiffness and ear discharge.   Respiratory: Positive for shortness of breath, no wheezing or cough Cardiovascular: Negative for chest pain, palpitations and leg swelling.  Gastrointestinal: Negative for heartburn, abdominal pain, vomiting, diarrhea or consitpation Genitourinary: Negative for dysuria, urgency, frequency, hematuria Musculoskeletal: Negative for back pain or joint pain Neurological: Negative for dizziness, seizures, syncope, focal weakness,  numbness and headaches.  Hematological: Does not bruise/bleed easily.  Psychiatric/Behavioral: Negative for hallucinations, confusion, dysphoric mood    Tolerating Diet: yes      DRUG ALLERGIES:   Allergies  Allergen Reactions  . Sulfa Antibiotics Shortness Of Breath    VITALS:  Blood pressure (!) 164/72, pulse (!) 106, temperature 98.3 F (36.8 C), resp. rate 20, height 5' 2"  (1.575 m), weight 59 kg, SpO2 93 %.  PHYSICAL EXAMINATION:  Constitutional: Appears well-developed and well-nourished. No distress. HENT: Normocephalic. Marland Kitchen Oropharynx is clear and moist.  Eyes: Conjunctivae and EOM are normal. PERRLA, no scleral icterus.  Neck: Normal ROM. Neck supple. No JVD. No tracheal deviation. CVS: RRR, S1/S2 +, no murmurs, no  gallops, no carotid bruit.  Pulmonary: Normal respiratory effort with decreased breath sounds two thirds of the way on the right lung without rales, rhonchi or wheezing Abdominal: Soft. BS +,  no distension, tenderness, rebound or guarding.  Musculoskeletal: Normal range of motion. No edema and no tenderness.  Neuro: Alert. CN 2-12 grossly intact. No focal deficits. Skin: Skin is warm and dry. No rash noted. Psychiatric: Normal mood and affect.      LABORATORY PANEL:   CBC Recent Labs  Lab 02/14/18 0438  WBC 2.7*  HGB 11.1*  HCT 34.9*  PLT 69*   ------------------------------------------------------------------------------------------------------------------  Chemistries  Recent Labs  Lab 02/09/18 1029  02/14/18 0438  NA 141   < > 142  K 3.8   < > 3.7  CL 109   < > 112*  CO2 24   < > 24  GLUCOSE 112*   < > 95  BUN 10   < > 12  CREATININE 0.68   < > 0.55  CALCIUM 8.5*   < > 8.2*  AST 32  --   --   ALT 19  --   --   ALKPHOS 82  --   --   BILITOT 1.5*  --   --    < > = values in this interval not displayed.   ------------------------------------------------------------------------------------------------------------------  Cardiac Enzymes Recent Labs  Lab 02/09/18 1029 02/13/18 1418  TROPONINI <0.03 <0.03   ------------------------------------------------------------------------------------------------------------------  RADIOLOGY:  Dg Chest 2 View  Result Date: 02/13/2018 CLINICAL DATA:  Shortness of breath. Status post right thoracentesis on 02/09/2018. EXAM: CHEST - 2 VIEW COMPARISON:  02/11/2018 and 02/09/2018. FINDINGS: The heart size and mediastinal contours are within normal limits. There is  further reaccumulation of right pleural fluid with large volume pleural effusion now present. Volume is fairly similar to the chest x-ray on 10/22 just before thoracentesis. Component of interstitial edema suspected. No left-sided pleural fluid. No pneumothorax. The  visualized skeletal structures are unremarkable. IMPRESSION: Further reaccumulation of right pleural fluid with large volume right pleural effusion present. Volume of fluid similar to chest x-ray on 10/22 prior to thoracentesis. Electronically Signed   By: Aletta Edouard M.D.   On: 02/13/2018 14:57     ASSESSMENT AND PLAN:   77 year old female with discharge from the hospital a few days ago due to pneumonia and large right pleural effusion status post thoracentesis who presents to the emergency room due to shortness of breath.  1.  Recurrent large right pleural effusion of unclear etiology: Ultrasound-guided thoracentesis ordered with fluid studies for tomorrow Consider pulmonary/CT surgery consultation tomorrow  2.  CAP: Patient was discharged on 5-day course of Levaquin on 23 October Continue Levaquin for now until follow-up studies from thoracentesis  3.  Hypothyroid: Continue Synthroid  4.  Depression and anxiety: Continue Seroquel, trazodone Lexapro and, BuSpar and Klonopin  5.  Liver cirrhosis based on CT imaging: Patient is GI follow-up Continue Xifaxan 6.  Nonocclusive thrombus in the splenic vein seen on previous CT scan of the abdomen and pelvis: Patient is not a candidate for anticoagulation due to severe thrombocytopenia    Management plans discussed with the patient and grandson and they are in agreement.  CODE STATUS: Full  TOTAL TIME TAKING CARE OF THIS PATIENT: 30 minutes.     POSSIBLE D/C 2 to 3 days, DEPENDING ON CLINICAL CONDITION.   Francois Elk M.D on 02/14/2018 at 9:48 AM  Between 7am to 6pm - Pager - [phone] After 6pm go to [email or web address] - password [email or web address] [facility]  Office  [phone]  CC: Primary care physician; Kirk Ruths, MD  Note: This dictation was prepared with Dragon dictation along with smaller phrase technology. Any transcriptional errors that result from this process are unintentional.

## 2018-02-14 NOTE — Plan of Care (Addendum)
The patient will have thoracenteses tomorrow most likely per Dr. Benjie Karvonen. Oxygen at 95% on room air. The patient ambulates to the bathroom with one person assist. The patient has been stable. Anxiety medication provided. Pain has been managed. On a regular diet during the day. Had some vaginal bleeding again today. Cpox monitoring.  Problem: Spiritual Needs Goal: Ability to function at adequate level 02/14/2018 1928 by Myles Rosenthal, Cory Roughen, RN Outcome: Progressing 02/14/2018 1231 by Myles Rosenthal, Cory Roughen, RN Outcome: Progressing   Problem: Education: Goal: Knowledge of General Education information will improve Description Including pain rating scale, medication(s)/side effects and non-pharmacologic comfort measures 02/14/2018 1928 by Myles Rosenthal, Cory Roughen, RN Outcome: Progressing 02/14/2018 1231 by Myles Rosenthal, Cory Roughen, RN Outcome: Progressing   Problem: Health Behavior/Discharge Planning: Goal: Ability to manage health-related needs will improve 02/14/2018 1928 by Ronna Polio, RN Outcome: Progressing 02/14/2018 1231 by Myles Rosenthal, Cory Roughen, RN Outcome: Progressing   Problem: Clinical Measurements: Goal: Ability to maintain clinical measurements within normal limits will improve 02/14/2018 1928 by Ronna Polio, RN Outcome: Progressing 02/14/2018 1231 by Myles Rosenthal, Cory Roughen, RN Outcome: Progressing Goal: Will remain free from infection 02/14/2018 1928 by Ronna Polio, RN Outcome: Progressing 02/14/2018 1231 by Myles Rosenthal, Cory Roughen, RN Outcome: Progressing Goal: Diagnostic test results will improve 02/14/2018 1928 by Ronna Polio, RN Outcome: Progressing 02/14/2018 1231 by Myles Rosenthal, Cory Roughen, RN Outcome: Progressing Goal: Respiratory complications will improve 02/14/2018 1928 by Ronna Polio, RN Outcome: Progressing 02/14/2018 1231 by Myles Rosenthal, Cory Roughen, RN Outcome: Progressing Goal:  Cardiovascular complication will be avoided 02/14/2018 1928 by Ronna Polio, RN Outcome: Progressing 02/14/2018 1231 by Ronna Polio, RN Outcome: Progressing   Problem: Activity: Goal: Risk for activity intolerance will decrease 02/14/2018 1928 by Myles Rosenthal, Cory Roughen, RN Outcome: Progressing 02/14/2018 1231 by Myles Rosenthal, Cory Roughen, RN Outcome: Progressing   Problem: Nutrition: Goal: Adequate nutrition will be maintained 02/14/2018 1928 by Myles Rosenthal, Cory Roughen, RN Outcome: Progressing 02/14/2018 1231 by Myles Rosenthal, Cory Roughen, RN Outcome: Progressing   Problem: Coping: Goal: Level of anxiety will decrease 02/14/2018 1928 by Myles Rosenthal, Cory Roughen, RN Outcome: Progressing 02/14/2018 1231 by Myles Rosenthal, Cory Roughen, RN Outcome: Progressing   Problem: Elimination: Goal: Will not experience complications related to bowel motility 02/14/2018 1928 by Ronna Polio, RN Outcome: Progressing 02/14/2018 1231 by Myles Rosenthal, Cory Roughen, RN Outcome: Progressing Goal: Will not experience complications related to urinary retention 02/14/2018 1928 by Ronna Polio, RN Outcome: Progressing 02/14/2018 1231 by Myles Rosenthal, Cory Roughen, RN Outcome: Progressing   Problem: Pain Managment: Goal: General experience of comfort will improve 02/14/2018 1928 by Ronna Polio, RN Outcome: Progressing 02/14/2018 1231 by Myles Rosenthal, Cory Roughen, RN Outcome: Progressing   Problem: Safety: Goal: Ability to remain free from injury will improve 02/14/2018 1928 by Ronna Polio, RN Outcome: Progressing 02/14/2018 1231 by Myles Rosenthal, Cory Roughen, RN Outcome: Progressing   Problem: Skin Integrity: Goal: Risk for impaired skin integrity will decrease 02/14/2018 1928 by Ronna Polio, RN Outcome: Progressing 02/14/2018 1231 by Ronna Polio, RN Outcome: Progressing

## 2018-02-14 NOTE — Progress Notes (Signed)
Patient grandson was lying in bed with patient and accidentally rubbed up against her arm creating  a skin tear on patient left foream.

## 2018-02-15 ENCOUNTER — Inpatient Hospital Stay: Payer: Medicare Other

## 2018-02-15 DIAGNOSIS — K769 Liver disease, unspecified: Secondary | ICD-10-CM

## 2018-02-15 DIAGNOSIS — J918 Pleural effusion in other conditions classified elsewhere: Secondary | ICD-10-CM

## 2018-02-15 LAB — CBC
HEMATOCRIT: 35.1 % — AB (ref 36.0–46.0)
HEMOGLOBIN: 11.4 g/dL — AB (ref 12.0–15.0)
MCH: 30 pg (ref 26.0–34.0)
MCHC: 32.5 g/dL (ref 30.0–36.0)
MCV: 92.4 fL (ref 80.0–100.0)
Platelets: 68 10*3/uL — ABNORMAL LOW (ref 150–400)
RBC: 3.8 MIL/uL — AB (ref 3.87–5.11)
RDW: 17.1 % — ABNORMAL HIGH (ref 11.5–15.5)
WBC: 3.1 10*3/uL — AB (ref 4.0–10.5)
nRBC: 0 % (ref 0.0–0.2)

## 2018-02-15 LAB — BASIC METABOLIC PANEL
ANION GAP: 5 (ref 5–15)
BUN: 11 mg/dL (ref 8–23)
CO2: 25 mmol/L (ref 22–32)
Calcium: 8.1 mg/dL — ABNORMAL LOW (ref 8.9–10.3)
Chloride: 112 mmol/L — ABNORMAL HIGH (ref 98–111)
Creatinine, Ser: 0.47 mg/dL (ref 0.44–1.00)
GFR calc Af Amer: >60 mL/min (ref >60)
GFR calc non Af Amer: >60 mL/min (ref >60)
GLUCOSE: 100 mg/dL — AB (ref 70–99)
Potassium: 3.7 mmol/L (ref 3.5–5.1)
SODIUM: 142 mmol/L (ref 135–145)

## 2018-02-15 LAB — GLUCOSE, PLEURAL OR PERITONEAL FLUID: Glucose, Fluid: 112 mg/dL

## 2018-02-15 LAB — LACTATE DEHYDROGENASE, PLEURAL OR PERITONEAL FLUID: LD, Fluid: 92 U/L — ABNORMAL HIGH (ref 3–23)

## 2018-02-15 LAB — PROTEIN, PLEURAL OR PERITONEAL FLUID: Total protein, fluid: <3 g/dL

## 2018-02-15 LAB — BODY FLUID CELL COUNT WITH DIFFERENTIAL
Eos, Fluid: 9 %
LYMPHS FL: 54 %
MONOCYTE-MACROPHAGE-SEROUS FLUID: 25 %
Neutrophil Count, Fluid: 11 %
Other Cells, Fluid: 1 %
WBC FLUID: 178 uL

## 2018-02-15 LAB — AMYLASE, PLEURAL OR PERITONEAL FLUID: AMYLASE FL: 32 U/L

## 2018-02-15 LAB — ALBUMIN, PLEURAL OR PERITONEAL FLUID

## 2018-02-15 MED ORDER — OCUVITE-LUTEIN PO CAPS
1.0000 | ORAL_CAPSULE | Freq: Every day | ORAL | Status: DC
  Administered 2018-02-15 – 2018-02-18 (×4): 1 via ORAL
  Filled 2018-02-15 (×5): qty 1

## 2018-02-15 MED ORDER — ACETAMINOPHEN 325 MG PO TABS
650.0000 mg | ORAL_TABLET | Freq: Four times a day (QID) | ORAL | Status: DC | PRN
  Administered 2018-02-15: 650 mg via ORAL
  Filled 2018-02-15: qty 2

## 2018-02-15 MED ORDER — MORPHINE SULFATE (PF) 2 MG/ML IV SOLN
2.0000 mg | INTRAVENOUS | Status: DC | PRN
  Administered 2018-02-15 – 2018-02-18 (×2): 2 mg via INTRAVENOUS
  Filled 2018-02-15 (×2): qty 1

## 2018-02-15 MED ORDER — ENSURE ENLIVE PO LIQD
237.0000 mL | Freq: Two times a day (BID) | ORAL | Status: DC
  Administered 2018-02-15: 237 mL via ORAL

## 2018-02-15 NOTE — Progress Notes (Signed)
PT Cancellation Note  Patient Details Name: Cassidy [name] MRN: [id number] DOB: 1940-05-06   Cancelled Treatment:    Reason Eval/Treat Not Completed: Patient at procedure or test/unavailable.  Pt for thoracentesis.  Will attempt to see pt again later today, schedule permitting.    Collie Siad PT, DPT 02/15/2018, 11:35 AM

## 2018-02-15 NOTE — Procedures (Signed)
R thoracentesis 2 L Small R PTX noted on follow up CXR EBL 0  Will obtain follow up CXR tomorrow morning.

## 2018-02-15 NOTE — Progress Notes (Signed)
[facility] [place] NAME: Cassidy [name]    MR#:  [id number]  DATE OF BIRTH:  [birth year]  SUBJECTIVE:   Patient states she is doing okay today.  She has not been up out of bed at all.  She is still feeling short of breath.  She denies any fevers or chills.  REVIEW OF SYSTEMS:    Review of Systems  Constitutional: Negative for fever, chills weight loss HENT: Negative for ear pain, nosebleeds, congestion, facial swelling, rhinorrhea, and ear discharge. +neck pain, negative for neck stiffness Respiratory: Positive for shortness of breath, no wheezing or cough Cardiovascular: Negative for chest pain, palpitations and leg swelling.  Gastrointestinal: Negative for heartburn, abdominal pain, vomiting, diarrhea or consitpation Genitourinary: Negative for dysuria, urgency, frequency, hematuria Musculoskeletal: Negative for back pain or joint pain Neurological: Negative for dizziness, seizures, syncope, focal weakness,  numbness and headaches.  Hematological: Does not bruise/bleed easily.  Psychiatric/Behavioral: Negative for hallucinations, confusion, dysphoric mood  Tolerating Diet: yes  DRUG ALLERGIES:   Allergies  Allergen Reactions  . Sulfa Antibiotics Shortness Of Breath    VITALS:  Blood pressure 132/69, pulse 95, temperature 98.2 F (36.8 C), temperature source Oral, resp. rate 20, height 5' 2"  (1.575 m), weight 59 kg, SpO2 91 %.  PHYSICAL EXAMINATION:  Constitutional: Appears well-developed and well-nourished. No distress. HENT: Normocephalic. atraumatic. Oropharynx is clear and moist.  Eyes: Conjunctivae and EOM are normal. PERRLA, no scleral icterus.  Neck: Normal ROM. Neck supple. No JVD. No tracheal deviation. CVS: RRR, S1/S2 +, no murmurs, no gallops, no carotid bruit.  Pulmonary: Diminished breath sounds throughout the entire right lung, no rales, rhonchi or wheezing, normal work of breathing Abdominal: Soft. BS +,  no  distension, tenderness, rebound or guarding.  Musculoskeletal: Normal range of motion. No edema and no tenderness.  Neuro: Alert. CN 2-12 grossly intact. No focal deficits. Skin: Skin is warm and dry. No rash noted. Psychiatric: Normal mood and affect.   LABORATORY PANEL:   CBC Recent Labs  Lab 02/15/18 0919  WBC 3.1*  HGB 11.4*  HCT 35.1*  PLT 68*   ------------------------------------------------------------------------------------------------------------------  Chemistries  Recent Labs  Lab 02/09/18 1029  02/15/18 0521  NA 141   < > 142  K 3.8   < > 3.7  CL 109   < > 112*  CO2 24   < > 25  GLUCOSE 112*   < > 100*  BUN 10   < > 11  CREATININE 0.68   < > 0.47  CALCIUM 8.5*   < > 8.1*  AST 32  --   --   ALT 19  --   --   ALKPHOS 82  --   --   BILITOT 1.5*  --   --    < > = values in this interval not displayed.   ------------------------------------------------------------------------------------------------------------------  Cardiac Enzymes Recent Labs  Lab 02/09/18 1029 02/13/18 1418  TROPONINI <0.03 <0.03   ------------------------------------------------------------------------------------------------------------------  RADIOLOGY:  Dg Chest 2 View  Result Date: 02/13/2018 CLINICAL DATA:  Shortness of breath. Status post right thoracentesis on 02/09/2018. EXAM: CHEST - 2 VIEW COMPARISON:  02/11/2018 and 02/09/2018. FINDINGS: The heart size and mediastinal contours are within normal limits. There is further reaccumulation of right pleural fluid with large volume pleural effusion now present. Volume is fairly similar to the chest x-ray on 10/22 just before thoracentesis. Component of interstitial edema suspected. No left-sided pleural fluid. No pneumothorax. The visualized skeletal structures  are unremarkable. IMPRESSION: Further reaccumulation of right pleural fluid with large volume right pleural effusion present. Volume of fluid similar to chest x-ray on  10/22 prior to thoracentesis. Electronically Signed   By: Aletta Edouard M.D.   On: 02/13/2018 14:57   Dg Chest Port 1 View  Result Date: 02/15/2018 CLINICAL DATA:  77 year old female with a history of thoracentesis EXAM: PORTABLE CHEST 1 VIEW COMPARISON:  02/13/2018, 02/11/2018 FINDINGS: Cardiomediastinal silhouette unchanged in size and contour. Improved appearance of the margins of the mediastinum after thoracentesis, with significant reduction in the fluid opacity of the right chest. Persisting blunting of the right costophrenic angle and cardiophrenic angle. Linear contour in the right suprahilar region may reflect pleural margin, with relative lucency at the apex. Left lung remains relatively well aerated. IMPRESSION: Significant decreased pleural fluid on the right status post thoracentesis. I suspect there is a small persisting air component of hydropneumothorax that was identified on present on prior CT study. Electronically Signed   By: Corrie Mckusick D.O.   On: 02/15/2018 11:26   US Thoracentesis Asp Pleural Space W/img Guide  Result Date: 02/15/2018 INDICATION: Right pleural effusion EXAM: ULTRASOUND GUIDED RIGHT THORACENTESIS MEDICATIONS: None. COMPLICATIONS: SIR Level A - No therapy, no consequence. Small right pneumothorax. PROCEDURE: An ultrasound guided thoracentesis was thoroughly discussed with the patient and questions answered. The benefits, risks, alternatives and complications were also discussed. The patient understands and wishes to proceed with the procedure. Written consent was obtained. Ultrasound was performed to localize and mark an adequate pocket of fluid in the right chest. The area was then prepped and draped in the normal sterile fashion. 1% Lidocaine was used for local anesthesia. Under ultrasound guidance a 6 Fr Safe-T-Centesis catheter was introduced. Thoracentesis was performed. The catheter was removed and a dressing applied. FINDINGS: A total of approximately 2 L of  clear yellow fluid was removed. IMPRESSION: Successful ultrasound guided right thoracentesis yielding 2 L of pleural fluid. Electronically Signed   By: Marybelle Killings M.D.   On: 02/15/2018 12:02     ASSESSMENT AND PLAN:   77 year old female with discharge from the hospital a few days ago due to pneumonia and large right pleural effusion status post thoracentesis who presents to the emergency room due to shortness of breath.  1.  Recurrent large right pleural effusion of unclear etiology: -Ultrasound-guided thoracentesis ordered with fluid studies today -Pulmonology consult, given recurrent nature of her effusion  2.  CAP: Patient was discharged on 5-day course of Levaquin on 10/23 -Continue Levaquin for now  3.  Hypothyroid: Continue Synthroid  4.  Depression and anxiety: Stable -Continue Seroquel, trazodone Lexapro, BuSpar and Klonopin  5.  Liver cirrhosis based on CT imaging: -Has outpatient GI appointment set up -Continue Xifaxan  6.  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  Chronic thrombocytopenia- platelets stable -Hold anticoagulation  Management plans discussed with the patient and grandson and they are in agreement.  CODE STATUS: Full  TOTAL TIME TAKING CARE OF THIS PATIENT: 35 minutes.    POSSIBLE D/C 1-2 days, DEPENDING ON CLINICAL CONDITION.   Berna Spare Iktan Aikman M.D on 02/15/2018 at 1:37 PM  Between 7am to 6pm - Pager - [phone] After 6pm go to [email or web address] - password [email or web address] [facility]  Office  [phone]  CC: Primary care physician; Kirk Ruths, MD  Note: This  dictation was prepared with Dragon dictation along with smaller phrase technology. Any transcriptional errors that result from this process are unintentional.

## 2018-02-15 NOTE — Plan of Care (Addendum)
The patient had a thoracentesis done today. The patient had been complaining of neck pain on the right side. Tylenol had not relieved pain  and additional PRN pain meds where provided. The Family at bedside. Chaplain services contacted. Drowsy after pain meds. Oxygen >93% at room air. Pulmonologist consulted. PT has worked with the patient today.  Problem: Spiritual Needs Goal: Ability to function at adequate level Outcome: Progressing   Problem: Education: Goal: Knowledge of General Education information will improve Description Including pain rating scale, medication(s)/side effects and non-pharmacologic comfort measures Outcome: Progressing   Problem: Health Behavior/Discharge Planning: Goal: Ability to manage health-related needs will improve Outcome: Progressing   Problem: Clinical Measurements: Goal: Ability to maintain clinical measurements within normal limits will improve Outcome: Progressing Goal: Will remain free from infection Outcome: Progressing Goal: Diagnostic test results will improve Outcome: Progressing Goal: Respiratory complications will improve Outcome: Progressing Goal: Cardiovascular complication will be avoided Outcome: Progressing   Problem: Activity: Goal: Risk for activity intolerance will decrease Outcome: Progressing   Problem: Nutrition: Goal: Adequate nutrition will be maintained Outcome: Progressing   Problem: Coping: Goal: Level of anxiety will decrease Outcome: Progressing   Problem: Elimination: Goal: Will not experience complications related to bowel motility Outcome: Progressing Goal: Will not experience complications related to urinary retention Outcome: Progressing   Problem: Pain Managment: Goal: General experience of comfort will improve Outcome: Progressing   Problem: Safety: Goal: Ability to remain free from injury will improve Outcome: Progressing   Problem: Skin Integrity: Goal: Risk for impaired skin integrity will  decrease Outcome: Progressing

## 2018-02-15 NOTE — Progress Notes (Signed)
   02/15/18 1400  Clinical Encounter Type  Visited With Patient  Visit Type Initial  Referral From Chaplain  Recommendations Follow-up, if possible.  Spiritual Encounters  Spiritual Needs Emotional;Prayer  Stress Factors  Patient Stress Factors Health changes  Family Stress Factors Health changes  Advance Directives (For Healthcare)  Does Patient Have a Medical Advance Directive?  (Chaplain discussed HCPOA, but not completed.)   Chaplain spoke with the patient and her sister concerning creating a HCPOA. The patient was lucid, but fell asleep, while Chaplain spoke with her sister concerning the patient's health concerns. The conversation led to a discussion about family tragedies, present concerns, and a general sense of powerlessness. Chaplain was paged to the ED before a HCPOA could be completed. Chaplain provided prayer, active listening, shared stories, and empathy.

## 2018-02-15 NOTE — Evaluation (Signed)
Physical Therapy Evaluation Patient Details Name: Cassidy [name] MRN: [id number] DOB: 11-24-1940 Today's Date: 02/15/2018   History of Present Illness  Pt is a 77 y/o F who presented with SOB due to recurrent large R pleural effusion s/p thoracentesis 10/28.  Pt with hospital stay just ~2 days PTA for R pleural effusion and chronic thrombocytopenia s/p thoracentesis during that hospital stay.  Pt's PMH includes dementia, chronic L2 compression fx, anxiety, depression, orthostasis.     Clinical Impression  Pt admitted with above diagnosis. Pt currently with functional limitations due to the deficits listed below (see PT Problem List).  Cassidy [name] was lethargic from recent pain medication but agreeable to therapy.  Pt required light min assist to stand due to improper use of RW and ambulated with decreased gait speed due to fatigue and lethargy.  Suspect that pt's mobility will improve once pain medicine discontinued and pt's medical status improves.  SpO2 remained at or above 89% on RA, staying 91-93% the majority of ambulation.  Pt will benefit from skilled PT to increase their independence and safety with mobility to allow discharge to the venue listed below.      Follow Up Recommendations Home health PT;Supervision for mobility/OOB    Equipment Recommendations  None recommended by PT    Recommendations for Other Services       Precautions / Restrictions Precautions Precautions: Fall;Other (comment) Precaution Comments: monitor O2 Restrictions Weight Bearing Restrictions: No      Mobility  Bed Mobility Overal bed mobility: Needs Assistance Bed Mobility: Supine to Sit;Sit to Supine     Supine to sit: HOB elevated;Min guard Sit to supine: Min guard   General bed mobility comments: Increased time and effort   Transfers Overall transfer level: Needs assistance Equipment used: Rolling walker (2 wheeled) Transfers: Sit to/from Stand Sit to Stand: Min assist         General  transfer comment: Light min assist to boost to standing.  Pt stand with both hands on RW.  Pt requires cues to avoid sitting prematurely once pt returned to bed.   Ambulation/Gait Ambulation/Gait assistance: Min guard Gait Distance (Feet): 35 Feet Assistive device: Rolling walker (2 wheeled) Gait Pattern/deviations: Decreased step length - right;Decreased step length - left;Shuffle;Trunk flexed Gait velocity: decreased Gait velocity interpretation: <1.31 ft/sec, indicative of household ambulator General Gait Details: Pt with significantly decreased gait speed, shuffling feet and taking small steps Bil.  Trunk flexed.  SpO2 remained at or above 89% on RA, staying 91-93% the majority of ambulation.    Stairs            Wheelchair Mobility    Modified Rankin (Stroke Patients Only)       Balance Overall balance assessment: Needs assistance;History of Falls Sitting-balance support: No upper extremity supported;Feet supported Sitting balance-Leahy Scale: Fair Sitting balance - Comments: Pt would likely lose her balance with perturbation   Standing balance support: Bilateral upper extremity supported;During functional activity Standing balance-Leahy Scale: Poor Standing balance comment: Pt relies on UE support for static and dynamic activities                             Pertinent Vitals/Pain Pain Assessment: No/denies pain Pain Intervention(s): Monitored during session    Home Living Family/patient expects to be discharged to:: Private residence Living Arrangements: Other relatives(sister and grandson) Available Help at Discharge: Family;Available 24 hours/day(family to provide 24/7 at d/c) Type of Home: House Home Access: Stairs  to enter Entrance Stairs-Rails: Right;Left;Can reach both Entrance Stairs-Number of Steps: 3 Home Layout: One level Home Equipment: Cane - single point;Walker - 2 wheels;Wheelchair - manual;Grab bars - tub/shower Additional Comments:  Life alert    Prior Function Level of Independence: Needs assistance   Gait / Transfers Assistance Needed: Pt does not like to use RW at home; pt with h/o falls; ambulatory  ADL's / Homemaking Assistance Needed: Assistance from grandson (EMT) and sister        Hand Dominance        Extremity/Trunk Assessment   Upper Extremity Assessment Upper Extremity Assessment: Generalized weakness    Lower Extremity Assessment Lower Extremity Assessment: Generalized weakness       Communication   Communication: No difficulties  Cognition Arousal/Alertness: Awake/alert Behavior During Therapy: WFL for tasks assessed/performed Overall Cognitive Status: History of cognitive impairments - at baseline                                        General Comments General comments (skin integrity, edema, etc.): Encouraged pt to sit up in chair after she has rested some in bed.     Exercises     Assessment/Plan    PT Assessment Patient needs continued PT services  PT Problem List Decreased strength;Decreased balance;Decreased knowledge of use of DME;Decreased safety awareness;Cardiopulmonary status limiting activity;Decreased activity tolerance;Decreased cognition       PT Treatment Interventions DME instruction;Gait training;Stair training;Functional mobility training;Therapeutic activities;Therapeutic exercise;Balance training;Neuromuscular re-education;Cognitive remediation;Patient/family education    PT Goals (Current goals can be found in the Care Plan section)  Acute Rehab PT Goals Patient Stated Goal: to improve strength and return to sister's home PT Goal Formulation: With patient/family Time For Goal Achievement: 03/01/18 Potential to Achieve Goals: Fair    Frequency Min 2X/week   Barriers to discharge        Co-evaluation               AM-PAC PT "6 Clicks" Daily Activity  Outcome Measure Difficulty turning over in bed (including adjusting  bedclothes, sheets and blankets)?: A Lot Difficulty moving from lying on back to sitting on the side of the bed? : A Lot Difficulty sitting down on and standing up from a chair with arms (e.g., wheelchair, bedside commode, etc,.)?: Unable Help needed moving to and from a bed to chair (including a wheelchair)?: A Little Help needed walking in hospital room?: A Little Help needed climbing 3-5 steps with a railing? : A Little 6 Click Score: 14    End of Session Equipment Utilized During Treatment: Gait belt Activity Tolerance: Patient tolerated treatment well;Patient limited by fatigue Patient left: in bed;with call bell/phone within reach;with bed alarm set;with family/visitor present Nurse Communication: Mobility status;Other (comment)(SpO2) PT Visit Diagnosis: Unsteadiness on feet (R26.81);Other abnormalities of gait and mobility (R26.89);Muscle weakness (generalized) (M62.81)    Time: 1610-9604 PT Time Calculation (min) (ACUTE ONLY): 23 min   Charges:   PT Evaluation $PT Eval Moderate Complexity: 1 Mod PT Treatments $Therapeutic Activity: 8-22 mins        Collie Siad PT, DPT 02/15/2018, 4:02 PM

## 2018-02-15 NOTE — Progress Notes (Signed)
OT Cancellation Note  Patient Details Name: DEYRA PERDOMO MRN: [id number] DOB: 08-04-1940   Cancelled Treatment:    Reason Eval/Treat Not Completed: Patient at procedure or test/ unavailable. Order received, chart reviewed. Pt currently receiving thoracentesis; unavailable for OT evaluation. Will re-attempt at later date/time as medically appropriate.   Jeni Salles, MPH, MS, OTR/L ascom [phone] 02/15/18, 11:35 AM

## 2018-02-15 NOTE — Progress Notes (Signed)
PT Cancellation Note  Patient Details Name: Cassidy [name] MRN: [id number] DOB: 1941/01/31   Cancelled Treatment:    Reason Eval/Treat Not Completed: Other (comment).  Attempted to see pt but pt with Chaplain who had just arrived.  Additionally, pt reports feeling "groggy" and declines working with therapy at this time.    Collie Siad PT, DPT 02/15/2018, 1:56 PM

## 2018-02-15 NOTE — Consult Note (Signed)
PULMONARY CONSULT NOTE  Requesting MD/Service: [facility] Date of initial consultation: 02/15/18 Reason for consultation: Recurrent R pleural effusion  PT PROFILE: 77 y.o. female never smoker admitted with dyspnea and recurrent R pleural effusion.  History of cirrhosis, splenomegaly, chronic thrombocytopenia.  DATA: 1022 thoracentesis: 2100 cc clear yellow fluid removed.  No fluid sent for analysis. 10/23 CT chest: (Performed after thoracentesis) Small right pneumothorax. Small to moderate size right pleural effusion. Right lower lobe consolidation with associated dependent atelectasis. The consolidation is consistent with pneumonia. Mild dependent subsegmental atelectasis in the right upper lobe and left lower lobe. Trace left pleural effusion 10/28 repeat thoracentesis: 2000 cc clear yellow fluid removed.  Initial chemistries consistent with transudate.  WBC 178, 54% lymphocytes.    INTERVAL:  HPI:  As above.  She was hospitalized 10/22-10/24 with dyspnea of several days duration and finding of a large R pleural effusion.  She underwent thoracentesis 10/22 and CT chest 10/23 with findings as above.  She was discharged home but rapidly developed progressive dyspnea requiring re-admission 10/26.  Chest x-ray reveals recurrence of large R pleural effusion.  History is provided by her sister.  The patient has been in declining health for approximately 2 years and does not presently perform most ADLs.  She is mostly dependent on her grandson who lives with her.  There is been no fever and no report of pleuritic chest pain.  She has had no significant cough, sputum production or hemoptysis.  She has no chronic lower extremity edema or calf tenderness.  Over the past couple of years she has lost substantial weight attributed to poor appetite and generally poor functional status.  The patient has recently been diagnosed with thrombocytopenia and noted to have splenomegaly and evidence of cirrhosis.  She also  has esophageal varices.  Past Medical History:  Diagnosis Date  . Anxiety   . Depression   . Fatty liver   . GERD (gastroesophageal reflux disease)   . Hypothyroidism   . Obesity   . Thyroid disease     Past Surgical History:  Procedure Laterality Date  . APPENDECTOMY    . DILATATION & CURETTAGE/HYSTEROSCOPY WITH MYOSURE N/A 02/04/2018   Procedure: DILATATION & CURETTAGE/HYSTEROSCOPY WITH MYOSURE;  Surgeon: Ward, Honor Loh, MD;  Location: [facility] ORS;  Service: Gynecology;  Laterality: N/A;  . ESOPHAGOGASTRODUODENOSCOPY (EGD) WITH PROPOFOL N/A 12/20/2014   Procedure: ESOPHAGOGASTRODUODENOSCOPY (EGD) WITH PROPOFOL;  Surgeon: Manya Silvas, MD;  Location: [facility] ENDOSCOPY;  Service: Endoscopy;  Laterality: N/A;  . SAVORY DILATION N/A 12/20/2014   Procedure: SAVORY DILATION;  Surgeon: Manya Silvas, MD;  Location: [facility] ENDOSCOPY;  Service: Endoscopy;  Laterality: N/A;  . TONSILLECTOMY      MEDICATIONS: I have reviewed all medications and confirmed regimen as documented  Social History   Socioeconomic History  . Marital status: Widowed    Spouse name: Not on file  . Number of children: 3  . Years of education: Not on file  . Highest education level: Not on file  Occupational History  . Not on file  Social Needs  . Financial resource strain: Not on file  . Food insecurity:    Worry: Not on file    Inability: Not on file  . Transportation needs:    Medical: Not on file    Non-medical: Not on file  Tobacco Use  . Smoking status: Never Smoker  . Smokeless tobacco: Never Used  Substance and Sexual Activity  . Alcohol use: No  . Drug use: No  .  Sexual activity: Not Currently    Birth control/protection: Post-menopausal  Lifestyle  . Physical activity:    Days per week: Not on file    Minutes per session: Not on file  . Stress: Not on file  Relationships  . Social connections:    Talks on phone: Not on file    Gets together: Not on file    Attends religious service:  Not on file    Active member of club or organization: Not on file    Attends meetings of clubs or organizations: Not on file    Relationship status: Not on file  . Intimate partner violence:    Fear of current or ex partner: Not on file    Emotionally abused: Not on file    Physically abused: Not on file    Forced sexual activity: Not on file  Other Topics Concern  . Not on file  Social History Narrative  . Not on file    Family History  Problem Relation Age of Onset  . Diabetes Mother   . COPD Mother   . Kidney disease Mother   . Depression Mother   . Hypertension Mother   . Heart attack Father   . Aneurysm Father   . Anxiety disorder Sister   . Depression Sister   . Diabetes Sister   . Hypertension Sister   . Atrial fibrillation Brother   . Spinal muscular atrophy Brother   . Breast cancer Sister   . Bone cancer Sister   . Depression Sister   . Anxiety disorder Sister   . Liver cancer Sister   . Breast cancer Sister   . Colon cancer Sister   . Depression Sister   . Diabetes Sister   . COPD Sister   . Depression Sister   . Anxiety disorder Sister   . Skin cancer Sister   . Diabetes Brother   . Depression Brother   . Skin cancer Maternal Aunt   . Diabetes Maternal Aunt   . Cervical cancer Paternal Aunt     ROS: No fever, myalgias/arthralgias, unexplained weight loss or weight gain No new focal weakness or sensory deficits No otalgia, hearing loss, visual changes, nasal and sinus symptoms, mouth and throat problems No neck pain or adenopathy No abdominal pain, N/V/D, diarrhea, change in bowel pattern No dysuria, change in urinary pattern   Vitals:   02/15/18 0516 02/15/18 1004 02/15/18 1046 02/15/18 1418  BP: (!) 169/79 (!) 177/76 132/69 (!) 119/48  Pulse: (!) 104 94 95 95  Resp: 20   20  Temp: 98.2 F (36.8 C)   98.5 F (36.9 C)  TempSrc: Oral   Oral  SpO2: 93% 91% 91% 94%  Weight:      Height:         EXAM:  Gen: Mildly somnolent,  chronically ill-appearing, No overt respiratory distress on RA HEENT: NCAT, sclera white, oropharynx normal Neck: Supple without LAN, thyromegaly, JVD Lungs: breath sounds diminished in right base.  No wheezes or other adventitious sounds Cardiovascular: RRR, + systolic M along LSB Abdomen: Soft, nontender, normal BS Ext: without clubbing, cyanosis, edema Neuro: CNs grossly intact, motor and sensory intact Skin: Limited exam, no lesions noted  DATA:   BMP Latest Ref Rng & Units 02/15/2018 02/14/2018 02/13/2018  Glucose 70 - 99 mg/dL 100(H) 95 105(H)  BUN 8 - 23 mg/dL 11 12 10   Creatinine 0.44 - 1.00 mg/dL 0.47 0.55 0.53  Sodium 135 - 145 mmol/L 142 142 142  Potassium 3.5 - 5.1 mmol/L 3.7 3.7 3.7  Chloride 98 - 111 mmol/L 112(H) 112(H) 112(H)  CO2 22 - 32 mmol/L 25 24 22   Calcium 8.9 - 10.3 mg/dL 8.1(L) 8.2(L) 8.3(L)    CBC Latest Ref Rng & Units 02/15/2018 02/14/2018 02/13/2018  WBC 4.0 - 10.5 K/uL 3.1(L) 2.7(L) 3.9(L)  Hemoglobin 12.0 - 15.0 g/dL 11.4(L) 11.1(L) 12.3  Hematocrit 36.0 - 46.0 % 35.1(L) 34.9(L) 38.1  Platelets 150 - 400 K/uL 68(L) 69(L) 81(L)    CXR 10/28: Small-moderate residual R pleural effusion.  Moderate apical pneumothorax.  I have personally reviewed all chest radiographs reported above including CXRs and CT chest unless otherwise indicated  IMPRESSION:   Rapidly recurring R pleural effusion.  Fluid chemistries suggest a transudate of process.  She does not have any evidence of congestive heart failure nor of kidney failure.  She has substantial evidence of portal venous hypertension with splenomegaly and esophageal varices.  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   PLAN:  I discussed my impression with the patient and her sister.  Her sister notes that she is scheduled to see Dr. Percell Boston PA in the office 10/30.  Since it is unlikely that she will make that appointment, I will  contact Dr. 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  I have ordered a CXR in the a.m. to follow pneumothorax.   Merton Border, MD PCCM service Mobile [phone] Pager [phone] 02/15/2018 3:56 PM

## 2018-02-15 NOTE — Progress Notes (Signed)
Initial Nutrition Assessment  DOCUMENTATION CODES:   Not applicable  INTERVENTION:  Ensure Enlive po BID, each supplement provides 350 kcal and 20 grams of protein MVI Magic cup TID with meals, each supplement provides 290 kcal and 9 grams of protein Monitor for diet advancement   NUTRITION DIAGNOSIS:   Predicted suboptimal nutrient intake related to acute illness(recurrent rt pleural effusion) as evidenced by estimated needs(NPO/FL diet inadequate to meet estimated needs).   GOAL:   Patient will meet greater than or equal to 90% of their needs   MONITOR:   Supplement acceptance, Diet advancement, PO intake, Weight trends  REASON FOR ASSESSMENT:   Malnutrition Screening Tool    ASSESSMENT:  77yo pt ED to hospital admit w/ increasing SOB and weakness. Chest x-ray reavealed recurrent lg rt pleural effusion; thoracentesis is scheduled for today. Pt recent admission 10/22 -10/24 for pneumonia and rt pleural effusion. PMH: liver cirrhosis, GERD, obesity, Appendectomy  Pt very groggy s/p thoracentesis at time of visit and RD could not arouse pt enough for obtaining nutritional history.   Medications: Lexapro, Megace, Protonix, Ativan, Morphine Labs: Cl 112 (H)  NUTRITION - FOCUSED PHYSICAL EXAM:  10/16: Severe Malnutrition identified Unable to fully access d/t pt status at time of visit, suspect diagnosis still appropriate. NFPE to be completed at F/U   Diet Order:   Diet Order            Diet full liquid Room service appropriate? Yes; Fluid consistency: Thin  Diet effective now              EDUCATION NEEDS:   No education needs have been identified at this time  Skin:  Skin Assessment: Reviewed RN Assessment(skin tare; left arm)  Last BM:  10/27; Type 5  Height:   Ht Readings from Last 1 Encounters:  02/13/18 5' 2"  (1.575 m)    Weight:   Wt Readings from Last 1 Encounters:  02/13/18 59 kg    Ideal Body Weight:  50 kg  BMI:  Body mass index is  23.78 kg/m.  Estimated Nutritional Needs:   Kcal:  1475-1770  Protein:  71-89g  Fluid:  1.4-1.5L   Lajuan Lines, RD, LDN  After Hours/Weekend Pager: [phone]

## 2018-02-16 ENCOUNTER — Inpatient Hospital Stay: Payer: Medicare Other

## 2018-02-16 ENCOUNTER — Inpatient Hospital Stay: Payer: Medicare Other | Admitting: Oncology

## 2018-02-16 DIAGNOSIS — J90 Pleural effusion, not elsewhere classified: Secondary | ICD-10-CM

## 2018-02-16 DIAGNOSIS — K7469 Other cirrhosis of liver: Secondary | ICD-10-CM

## 2018-02-16 LAB — CBC
HCT: 33.2 % — ABNORMAL LOW (ref 36.0–46.0)
Hemoglobin: 10.8 g/dL — ABNORMAL LOW (ref 12.0–15.0)
MCH: 29.8 pg (ref 26.0–34.0)
MCHC: 32.5 g/dL (ref 30.0–36.0)
MCV: 91.7 fL (ref 80.0–100.0)
NRBC: 0 % (ref 0.0–0.2)
PLATELETS: 55 10*3/uL — AB (ref 150–400)
RBC: 3.62 MIL/uL — AB (ref 3.87–5.11)
RDW: 17.2 % — ABNORMAL HIGH (ref 11.5–15.5)
WBC: 3.1 10*3/uL — ABNORMAL LOW (ref 4.0–10.5)

## 2018-02-16 LAB — PROTEIN, BODY FLUID (OTHER): Total Protein, Body Fluid Other: 1.3 g/dL

## 2018-02-16 LAB — BASIC METABOLIC PANEL
ANION GAP: 4 — AB (ref 5–15)
BUN: 15 mg/dL (ref 8–23)
CHLORIDE: 110 mmol/L (ref 98–111)
CO2: 27 mmol/L (ref 22–32)
Calcium: 7.6 mg/dL — ABNORMAL LOW (ref 8.9–10.3)
Creatinine, Ser: 0.61 mg/dL (ref 0.44–1.00)
GFR calc Af Amer: >60 mL/min (ref >60)
Glucose, Bld: 83 mg/dL (ref 70–99)
POTASSIUM: 4 mmol/L (ref 3.5–5.1)
SODIUM: 141 mmol/L (ref 135–145)

## 2018-02-16 LAB — FOLATE: Folate: 16.1 ng/mL (ref >5.9)

## 2018-02-16 LAB — IRON AND TIBC
IRON: 29 ug/dL (ref 28–170)
SATURATION RATIOS: 13 % (ref 10.4–31.8)
TIBC: 229 ug/dL — AB (ref 250–450)
UIBC: 200 ug/dL

## 2018-02-16 LAB — FERRITIN: FERRITIN: 63 ng/mL (ref 11–307)

## 2018-02-16 LAB — PH, BODY FLUID: pH, Body Fluid: 7.6

## 2018-02-16 LAB — CYTOLOGY - NON PAP

## 2018-02-16 MED ORDER — MEGESTROL ACETATE 20 MG PO TABS
20.0000 mg | ORAL_TABLET | Freq: Two times a day (BID) | ORAL | Status: DC
  Filled 2018-02-16: qty 1

## 2018-02-16 MED ORDER — FUROSEMIDE 10 MG/ML IJ SOLN
20.0000 mg | Freq: Every day | INTRAMUSCULAR | Status: DC
  Administered 2018-02-16: 20 mg via INTRAVENOUS
  Filled 2018-02-16: qty 4

## 2018-02-16 MED ORDER — FUROSEMIDE 40 MG PO TABS
40.0000 mg | ORAL_TABLET | Freq: Every day | ORAL | Status: DC
  Administered 2018-02-17 – 2018-02-18 (×2): 40 mg via ORAL
  Filled 2018-02-16 (×2): qty 1

## 2018-02-16 MED ORDER — MEGESTROL ACETATE 40 MG PO TABS: 300.0000 mg | ORAL_TABLET | Freq: Two times a day (BID) | ORAL | Status: DC

## 2018-02-16 MED ORDER — MEGESTROL ACETATE 400 MG/10ML PO SUSP
300.0000 mg | Freq: Two times a day (BID) | ORAL | Status: DC
  Administered 2018-02-16 – 2018-02-18 (×4): 300 mg via ORAL
  Filled 2018-02-16 (×7): qty 10

## 2018-02-16 MED ORDER — SPIRONOLACTONE 25 MG PO TABS
50.0000 mg | ORAL_TABLET | Freq: Every day | ORAL | Status: DC
  Administered 2018-02-16 – 2018-02-17 (×2): 50 mg via ORAL
  Filled 2018-02-16 (×2): qty 2

## 2018-02-16 MED ORDER — VITAMIN C 500 MG PO TABS
250.0000 mg | ORAL_TABLET | Freq: Two times a day (BID) | ORAL | Status: DC
  Administered 2018-02-16 – 2018-02-18 (×5): 250 mg via ORAL
  Filled 2018-02-16: qty 1
  Filled 2018-02-16: qty 0.5
  Filled 2018-02-16 (×3): qty 1

## 2018-02-16 MED ORDER — COPPER GLUCONATE 2 MG PO TABS
2.0000 mg | ORAL_TABLET | Freq: Every day | ORAL | Status: DC
  Administered 2018-02-16 – 2018-02-17 (×2): 2 mg via ORAL
  Filled 2018-02-16 (×4): qty 1

## 2018-02-16 NOTE — Evaluation (Signed)
Occupational Therapy Evaluation Patient Details Name: Cassidy [name] MRN: [id number] DOB: 1941-03-31 Today's Date: 02/16/2018    History of Present Illness Pt is a 77 y/o F who presented with SOB due to recurrent large R pleural effusion s/p thoracentesis 10/28.  Pt with hospital stay just ~2 days PTA for R pleural effusion and chronic thrombocytopenia s/p thoracentesis during that hospital stay.  Pt's PMH includes dementia, chronic L2 compression fx, anxiety, depression, orthostasis.    Clinical Impression   Pt seen for OT evaluation this date. Pt is a 77 y/o female who presented with SOB due to recurrent R large R pleural effusion s/p thoracentesis 10/28. Prior to admission, pt enjoyed watching her favorite television shows. Pt lives with her sister and grandson in a house with 3 steps to enter. Per family, pt received assistance with all ADL/IADL tasks in the home with the exception of toilet hygiene. Currently, pt demonstrates impairments in activity tolerance, coordination, DME/AE use, safety awareness, strength, balance and cognition requiring Mod assist with LB ADL tasks and Min A with UB ADL tasks and CGA with ambulation using a RW. Pt/family educated in caregiver burnout prevention. Pt will benefit from skilled OT services to address noted impairments and functional deficits in order to maximize safety and independence and minimize caregiver burden and falls risk. OT recommends HHOT upon d/c.     Follow Up Recommendations  Home health OT    Equipment Recommendations  (Pt caregiver expressed interest in hospital bed. )    Recommendations for Other Services       Precautions / Restrictions Precautions Precautions: Fall;Other (comment) Precaution Comments: monitor O2 Restrictions Weight Bearing Restrictions: No      Mobility Bed Mobility Overal bed mobility: Needs Assistance Bed Mobility: Supine to Sit;Sit to Supine     Supine to sit: Min guard;Mod assist(Head of bed  elevated; initially needed CGA + VC; Mod A needed hips to EOB. ) Sit to supine: Min guard   General bed mobility comments: Increased time and effort   Transfers   Equipment used: Rolling walker (2 wheeled) Transfers: Sit to/from Stand Sit to Stand: Min guard;From elevated surface(VC for hand placement)         General transfer comment:   Pt stand with both hands on RW.   Pt kyphotic requiring verabal and tactile cues for straightening and looking up. Pt requires cues to avoid sitting prematurely once pt returned to bed.    Balance Overall balance assessment: Needs assistance Sitting-balance support: Feet supported;Single extremity supported Sitting balance-Leahy Scale: Poor   Postural control: Right lateral lean Standing balance support: Bilateral upper extremity supported;During functional activity Standing balance-Leahy Scale: Poor                             ADL either performed or assessed with clinical judgement   ADL Overall ADL's : Needs assistance/impaired;At baseline Eating/Feeding: Sitting;Set up;Supervision/ safety   Grooming: Sitting;Supervision/safety;Set up   Upper Body Bathing: Minimal assistance;Sitting;Cueing for sequencing   Lower Body Bathing: Moderate assistance;Sitting/lateral leans;Cueing for sequencing   Upper Body Dressing : Sitting;Minimal assistance;Cueing for sequencing   Lower Body Dressing: Moderate assistance;Cueing for sequencing;Sitting/lateral leans   Toilet Transfer: Min guard;RW;Ambulation;BSC           Functional mobility during ADLs: Min guard;Rolling walker;Cueing for sequencing       Vision Baseline Vision/History: Wears glasses Wears Glasses: Reading only Patient Visual Report: No change from baseline  Perception     Praxis      Pertinent Vitals/Pain Pain Assessment: No/denies pain     Hand Dominance     Extremity/Trunk Assessment Upper Extremity Assessment Upper Extremity Assessment:  Generalized weakness   Lower Extremity Assessment Lower Extremity Assessment: Generalized weakness   Cervical / Trunk Assessment Cervical / Trunk Assessment: Kyphotic   Communication Communication Communication: No difficulties   Cognition Arousal/Alertness: Awake/alert Behavior During Therapy: WFL for tasks assessed/performed Overall Cognitive Status: Impaired/Different from baseline Area of Impairment: Orientation;Memory                 Orientation Level: Disoriented to;Situation   Memory: Decreased short-term memory             General Comments  Pt O2 89-90% EOB; 94% back in bed.    Exercises Other Exercises Other Exercises: Pt/Caregiver educated in caregiver burnout prevention strategies.   Shoulder Instructions      Home Living   Living Arrangements: Other relatives(sister and grandson) Available Help at Discharge: Family;Available 24 hours/day Type of Home: House Home Access: Stairs to enter [employer] of Steps: 3 Entrance Stairs-Rails: Right;Left;Can reach both Home Layout: One level     Bathroom Shower/Tub: Tub/shower unit;Walk-in shower   Bathroom Toilet: Standard     Home Equipment: Cane - single point;Walker - 2 wheels;Wheelchair - manual;Grab bars - tub/shower   Additional Comments: Life alert      Prior Functioning/Environment Level of Independence: Needs assistance  Gait / Transfers Assistance Needed: Pt intermittently uses RW in the home. Due to medical/family issues in the past month, pt has stopped going out into the commuity with family as much. Pt has falls and sometimes w/o warning simply sits down on floor when walking.  ADL's / Homemaking Assistance Needed: Heavy assistance from grandson (EMT) and sister on almost all ADL and IADL tasks.             OT Problem List: Decreased strength;Impaired balance (sitting and/or standing);Decreased safety awareness;Decreased knowledge of use of DME or AE;Decreased  coordination;Decreased activity tolerance      OT Treatment/Interventions: Self-care/ADL training;DME and/or AE instruction;Therapeutic activities;Balance training;Therapeutic exercise;Cognitive remediation/compensation;Patient/family education    OT Goals(Current goals can be found in the care plan section) Acute Rehab OT Goals Patient Stated Goal: to improve strength and return to sister's home OT Goal Formulation: With patient/family Time For Goal Achievement: 03/02/18 Potential to Achieve Goals: Good ADL Goals  Additional ADL Goal #1: Pt will utilize 1-2  falls prevention strategies when completing ADL tasks. Additional ADL Goal #2: Pt/caregiver will utilize at least one learned cognitive compensatory strategy to complete ADL tasks.  OT Frequency: Min 1X/week   Barriers to D/C:            Co-evaluation              AM-PAC PT "6 Clicks" Daily Activity     Outcome Measure Help from another person eating meals?: A Little Help from another person taking care of personal grooming?: None Help from another person toileting, which includes using toliet, bedpan, or urinal?: A Little Help from another person bathing (including washing, rinsing, drying)?: A Lot Help from another person to put on and taking off regular upper body clothing?: A Little Help from another person to put on and taking off regular lower body clothing?: A Lot 6 Click Score: 17   End of Session Equipment Utilized During Treatment: Gait belt;Rolling walker  Activity Tolerance: Patient tolerated treatment well Patient left: in bed;with call  bell/phone within reach;with bed alarm set;with family/visitor present  OT Visit Diagnosis: Other abnormalities of gait and mobility (R26.89);Unsteadiness on feet (R26.81);Muscle weakness (generalized) (M62.81)                Time: 4982-6415 OT Time Calculation (min): 41 min Charges:     Jadene Pierini OTS  02/16/2018, 12:47 PM

## 2018-02-16 NOTE — Progress Notes (Signed)
[facility] [place] NAME: Cassidy [name]    MR#:  [id number]  DATE OF BIRTH:  1940/07/30  SUBJECTIVE:   States she is feeling fine today.  Having some pain over the site where they took off fluid.  Denies any shortness of breath or cough.  REVIEW OF SYSTEMS:    Review of Systems  Constitutional: Negative for fever, chills weight loss HENT: Negative for ear pain, nosebleeds, congestion, facial swelling, rhinorrhea, and ear discharge. +neck pain, negative for neck stiffness Respiratory: Positive for shortness of breath, no wheezing or cough Cardiovascular: Negative for chest pain, palpitations and leg swelling.  Gastrointestinal: Negative for heartburn, abdominal pain, vomiting, diarrhea or consitpation Genitourinary: Negative for dysuria, urgency, frequency, hematuria Musculoskeletal: Negative for joint pain, + back pain Neurological: Negative for dizziness, seizures, syncope, focal weakness,  numbness and headaches.  Hematological: Does not bruise/bleed easily.  Psychiatric/Behavioral: Negative for hallucinations, confusion, dysphoric mood  Tolerating Diet: yes  DRUG ALLERGIES:   Allergies  Allergen Reactions  . Sulfa Antibiotics Shortness Of Breath    VITALS:  Blood pressure (!) 107/54, pulse 88, temperature 97.9 F (36.6 C), temperature source Oral, resp. rate 18, height 5' 2"  (1.575 m), weight 59 kg, SpO2 94 %.  PHYSICAL EXAMINATION:  Constitutional: Appears well-developed and well-nourished. No distress. HENT: Normocephalic. atraumatic. Oropharynx is clear and moist.  Eyes: Conjunctivae and EOM are normal. PERRLA, no scleral icterus.  Neck: Normal ROM. Neck supple. No JVD. No tracheal deviation. CVS: RRR, S1/S2 +, no murmurs, no gallops, no carotid bruit.  Pulmonary: Clear to auscultation bilaterally except for diminished lung sounds in the right lung base, no rales, rhonchi or wheezing, normal work of breathing Abdominal: Soft.  BS +,  no distension, tenderness, rebound or guarding.  Musculoskeletal: Normal range of motion. No edema and no tenderness.  Neuro: Alert. CN 2-12 grossly intact. No focal deficits. Skin: Skin is warm and dry. No rash noted. Psychiatric: Normal mood and affect.   LABORATORY PANEL:   CBC Recent Labs  Lab 02/16/18 0425  WBC 3.1*  HGB 10.8*  HCT 33.2*  PLT 55*   ------------------------------------------------------------------------------------------------------------------  Chemistries  Recent Labs  Lab 02/16/18 0425  NA 141  K 4.0  CL 110  CO2 27  GLUCOSE 83  BUN 15  CREATININE 0.61  CALCIUM 7.6*   ------------------------------------------------------------------------------------------------------------------  Cardiac Enzymes Recent Labs  Lab 02/13/18 1418  TROPONINI <0.03   ------------------------------------------------------------------------------------------------------------------  RADIOLOGY:  Dg Chest 1 View  Result Date: 02/16/2018 CLINICAL DATA:  Right-sided thoracentesis yesterday for pleural effusion. Follow-up radiograph. EXAM: CHEST  1 VIEW COMPARISON:  Portable chest x-ray of February 15, 2018 FINDINGS: There is a persistent approximately 10% right apical pneumothorax. The volume of pleural fluid on the right is stable to slightly more conspicuous. There is stable soft tissue density in the right paratracheal region above the right hilum. The left lung is well-expanded and clear. There is no left pleural effusion. The heart and pulmonary vascularity are normal. There is reverse S shaped thoracolumbar scoliosis. IMPRESSION: Stable approximately 10% right apical pneumothorax. Stable to slightly increased volume of right pleural effusion. Persistent soft tissue fullness in the right paratracheal region Electronically Signed   By: David  Martinique M.D.   On: 02/16/2018 10:03   Dg Chest Port 1 View  Result Date: 02/15/2018 CLINICAL DATA:  Recurrent large  right pleural effusion. Status post thoracentesis on 02/15/2018. EXAM: PORTABLE CHEST 1 VIEW COMPARISON:  1058 hours on the same day FINDINGS:  Unchanged right-sided hydropneumothorax with the pneumothorax extending to the posterior right fourth rib level. Blunting of the right lateral costophrenic angle is stable. Top-normal heart size. Aortic atherosclerosis with uncoiling. There is dextroconvex curvature of thoracic spine. IMPRESSION: Unchanged right-sided small hydropneumothorax/ex vacuo pneumothorax. Stable cardiomegaly with aortic atherosclerosis. Electronically Signed   By: Ashley Royalty M.D.   On: 02/15/2018 18:19   Dg Chest Port 1 View  Result Date: 02/15/2018 CLINICAL DATA:  77 year old female with a history of thoracentesis EXAM: PORTABLE CHEST 1 VIEW COMPARISON:  02/13/2018, 02/11/2018 FINDINGS: Cardiomediastinal silhouette unchanged in size and contour. Improved appearance of the margins of the mediastinum after thoracentesis, with significant reduction in the fluid opacity of the right chest. Persisting blunting of the right costophrenic angle and cardiophrenic angle. Linear contour in the right suprahilar region may reflect pleural margin, with relative lucency at the apex. Left lung remains relatively well aerated. IMPRESSION: Significant decreased pleural fluid on the right status post thoracentesis. I suspect there is a small persisting air component of hydropneumothorax that was identified on present on prior CT study. Electronically Signed   By: Corrie Mckusick D.O.   On: 02/15/2018 11:26   US Thoracentesis Asp Pleural Space W/img Guide  Result Date: 02/15/2018 INDICATION: Right pleural effusion EXAM: ULTRASOUND GUIDED RIGHT THORACENTESIS MEDICATIONS: None. COMPLICATIONS: SIR Level A - No therapy, no consequence. Small right pneumothorax. PROCEDURE: An ultrasound guided thoracentesis was thoroughly discussed with the patient and questions answered. The benefits, risks, alternatives and  complications were also discussed. The patient understands and wishes to proceed with the procedure. Written consent was obtained. Ultrasound was performed to localize and mark an adequate pocket of fluid in the right chest. The area was then prepped and draped in the normal sterile fashion. 1% Lidocaine was used for local anesthesia. Under ultrasound guidance a 6 Fr Safe-T-Centesis catheter was introduced. Thoracentesis was performed. The catheter was removed and a dressing applied. FINDINGS: A total of approximately 2 L of clear yellow fluid was removed. IMPRESSION: Successful ultrasound guided right thoracentesis yielding 2 L of pleural fluid. Electronically Signed   By: Marybelle Killings M.D.   On: 02/15/2018 12:02    ASSESSMENT AND PLAN:   77 year old female with discharge from the hospital a few days ago due to pneumonia and large right pleural effusion status post thoracentesis who presents to the emergency room due to shortness of breath.  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 -Pulmonology consulted-feel that her effusion is related to her portal hypertension -GI consulted for further management -Recheck chest x-ray in the morning  CAP: Patient was discharged on 5-day course of Levaquin on 10/23 -We will get last dose of Levaquin tomorrow  Hypothyroid: stable -Continue Synthroid  Depression and anxiety: Stable -Continue Seroquel, trazodone Lexapro, Buspar and Klonopin  Liver cirrhosis based on CT imaging: No history of alcohol abuse -GI consult -Continue Xifaxan  Nonocclusive thrombus in the splenic vein seen on previous CT scan of the abdomen and pelvis: Patient is not a candidate for anticoagulation due to severe thrombocytopenia   Chronic thrombocytopenia- platelets stable -Hold anticoagulation  Management plans discussed with the patient and grandson and they are in agreement.  CODE STATUS: Full  TOTAL TIME TAKING CARE OF THIS  PATIENT: 33 minutes.    POSSIBLE D/C 1-2 days, DEPENDING ON CLINICAL CONDITION.   Berna Spare Delores Thelen M.D on 02/16/2018 at 1:53 PM  Between 7am to 6pm - Pager - [phone] After 6pm go to [email or web address] - password [email or web address]  [facility]  Office  [phone]  CC: Primary care physician; Kirk Ruths, MD  Note: This dictation was prepared with Dragon dictation along with smaller phrase technology. Any transcriptional errors that result from this process are unintentional.

## 2018-02-16 NOTE — Progress Notes (Signed)
Patient has done well over the past 24 hours.  No new complaints.  She remains comfortable on room air.  Her chest x-ray reveals partial reaccumulation of the right pleural effusion and partial resolution of the right pneumothorax.  I have again reviewed all her data including pleural fluid analysis, echocardiogram etc.  In addition, I reviewed CT scan of the abdomen and pelvis with Dr. Barbie Banner yesterday.  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  I have requested consultation with gastroenterology and spoke with Dr. Marius Ditch  At this point, I will sign off.  Please call if I can be of further assistance.  Merton Border, MD PCCM service Mobile [phone] Pager [phone] 02/16/2018 1:17 PM

## 2018-02-16 NOTE — Consult Note (Addendum)
Cassidy Darby, MD [address]  [place]  [place], [place] [address]  Main: [phone]  Fax: [phone] Pager: [phone]   Consultation  Referring Provider:     No ref. provider found Primary Care Physician:  Kirk Ruths, MD Primary Gastroenterologist: Dr Tiffany Kocher         Reason for Consultation:     Cirrhosis of liver, recurrent pleural effusions  Date of Admission:  02/13/2018 Date of Consultation:  02/16/2018         HPI:   Cassidy [name] is a 77 y.o. Caucasian female with history of hypothyroidism, obesity, fatty liver who was hospitalized 10/22 to 10/24 with dyspnea of several days duration and found to have large right pleural effusion.  She underwent thoracentesis on 10/22.  She was discharged home but rapidly developed progressive dyspnea requiring readmission on 10/26.  Chest x-ray revealed recurrence of large right pleural effusion.  Apparently, patient's health has been declining for the last 2 years and not able to perform most of her ADLs.  She is dependent on her grandson who lives with her.  She denies fever, cough,.  She does have mild bilateral swelling of legs.  She did lose significant weight over the last 2 years attributed to poor health and poor functional status.  On chart review it appears that patient has chronic thrombocytopenia dating back to 2015 associated with recent onset of anemia.  She was given a diagnosis of cirrhosis of liver back then.  Based on ultrasound in 10/2010, it appears that she has splenomegaly.  She has been followed by oncologist Dr. Tasia Catchings for pancytopenia.  Recent cross-sectional imaging revealed splenomegaly with portal hypertension.  Viral hepatitis panel was negative.  She was referred to [facility] GI, Dr. Vira Agar for further management.  Patient has been started on rifaximin for hepatic encephalopathy by her primary care physician. In the meantime patient got admitted to the hospital 2 days ago due to recurrent  right-sided pleural effusion Of note, patient was admitted on 02/04/1989 secondary to vaginal bleeding in the setting of severe thrombocytopenia  The pulmonologist, Dr. 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  Patient underwent repeat thoracentesis yesterday with 2 L of clear yellow fluid removed.  When interviewed patient, patient was lying comfortably in bed, not on oxygen supplementation.  Speaking in full sentences without feeling short of breath.  Limited history obtained.  She denied chest pain, fever, chills, abdominal distention, jaundice, dark urine.  Nurse reported that she had brown bowel movements since hospital stay.  NSAIDs: None  Antiplts/Anticoagulants/Anti thrombotics: None  GI Procedures: She had several EGDs and colonoscopies in the past.  No known varices or portal hypertensive gastropathy Biopsies negative for H. pylori infection EGD 12/20/2014 - Moderate Schatzki ring. Dilated. - Erythematous mucosa in the antrum. Biopsied. - Normal examined duodenum. DIAGNOSIS:  A. STOMACH; COLD BIOPSY:  - ANTRAL MUCOSA WITH FEATURES OF REACTIVE GASTROPATHY, SEE NOTE.  - OXYNTIC MUCOSA WITH FOCAL MINIMAL CHRONIC GASTRITIS.  - NEGATIVE FOR H. PYLORI, DYSPLASIA, AND MALIGNANCY  Past Medical History:  Diagnosis Date  . Anxiety   . Depression   . Fatty liver   . GERD (gastroesophageal reflux disease)   . Hypothyroidism   . Obesity   . Thyroid disease     Past Surgical History:  Procedure Laterality Date  . APPENDECTOMY    .  DILATATION & CURETTAGE/HYSTEROSCOPY WITH MYOSURE N/A 02/04/2018   Procedure: DILATATION & CURETTAGE/HYSTEROSCOPY WITH MYOSURE;  Surgeon: Ward, Honor Loh, MD;  Location: [facility] ORS;  Service: Gynecology;  Laterality: N/A;  . ESOPHAGOGASTRODUODENOSCOPY (EGD) WITH PROPOFOL N/A 12/20/2014   Procedure:  ESOPHAGOGASTRODUODENOSCOPY (EGD) WITH PROPOFOL;  Surgeon: Manya Silvas, MD;  Location: [facility] ENDOSCOPY;  Service: Endoscopy;  Laterality: N/A;  . SAVORY DILATION N/A 12/20/2014   Procedure: SAVORY DILATION;  Surgeon: Manya Silvas, MD;  Location: [facility] ENDOSCOPY;  Service: Endoscopy;  Laterality: N/A;  . TONSILLECTOMY      Prior to Admission medications   Medication Sig Start Date End Date Taking? Authorizing Provider  acidophilus (RISAQUAD) CAPS capsule Take 1 capsule by mouth 2 (two) times daily.   Yes [provider]  busPIRone (BUSPAR) 15 MG tablet Take 1 tablet (15 mg total) by mouth at bedtime. 01/05/18  Yes Ursula Alert, MD  clonazePAM (KLONOPIN) 0.5 MG tablet Take 1 tablet (0.5 mg total) by mouth daily as needed for anxiety. Only for severe anxiety sx 01/05/18  Yes Ursula Alert, MD  Copper Gluconate 2 MG TABS Take 1 tablet (2 mg total) by mouth daily. 02/06/18  Yes Epifanio Lesches, MD  escitalopram (LEXAPRO) 10 MG tablet Take 1 tablet (10 mg total) by mouth daily. 01/05/18  Yes Ursula Alert, MD  levothyroxine (SYNTHROID, LEVOTHROID) 100 MCG tablet Take 1 tablet (100 mcg total) by mouth daily before breakfast. 11/25/17  Yes Mayo, Pete Pelt, MD  loperamide (IMODIUM) 2 MG capsule Take 2 mg by mouth as needed for diarrhea or loose stools.   Yes [provider]  megestrol (MEGACE) 20 MG tablet Take 1 tablet (20 mg total) by mouth 2 (two) times daily. 12/18/17  Yes Earlie Server, MD  Melatonin 1 MG TABS Take 5 mg by mouth at bedtime as needed (for sleep).    Yes [provider]  omeprazole (PRILOSEC) 40 MG capsule Take 1 capsule (40 mg total) by mouth daily. 06/19/17 06/19/18 Yes Earleen Newport, MD  ondansetron (ZOFRAN-ODT) 8 MG disintegrating tablet Take 8 mg by mouth every 8 (eight) hours as needed for nausea or vomiting.   Yes [provider]  potassium chloride (K-DUR) 10 MEQ tablet Take 10 mEq by mouth daily.   Yes [provider]    QUEtiapine (SEROQUEL) 100 MG tablet Take 1 tablet (100 mg total) by mouth at bedtime. 01/05/18  Yes Ursula Alert, MD  traZODone (DESYREL) 150 MG tablet Take 1 tablet (150 mg total) by mouth at bedtime. 01/05/18  Yes Ursula Alert, MD  traZODone (DESYREL) 50 MG tablet Take 1 tablet (50 mg total) by mouth at bedtime as needed for sleep. To be combined with 150 mg as needed 01/05/18  Yes Eappen, Ria Clock, MD  vitamin C (VITAMIN C) 250 MG tablet Take 1 tablet (250 mg total) by mouth 2 (two) times daily. 02/05/18  Yes Epifanio Lesches, MD  XIFAXAN 550 MG TABS tablet Take 1 tablet by mouth 2 (two) times daily. 10/21/17  Yes [provider]    Current Facility-Administered Medications:  .  acetaminophen (TYLENOL) tablet 650 mg, 650 mg, Oral, Q6H PRN, Sela Hua, MD, 650 mg at 02/15/18 1131 .  busPIRone (BUSPAR) tablet 15 mg, 15 mg, Oral, QHS, Mayo, Pete Pelt, MD, 15 mg at 02/15/18 2118 .  clonazePAM (KLONOPIN) tablet 0.5 mg, 0.5 mg, Oral, Daily PRN, Mayo, Pete Pelt, MD, 0.5 mg at 02/15/18 1908 .  Copper Gluconate TABS 2 mg, 2 mg, Oral, Daily, Mayo,  Pete Pelt, MD, 2 mg at 02/16/18 1836 .  escitalopram (LEXAPRO) tablet 10 mg, 10 mg, Oral, Daily, Mayo, Pete Pelt, MD, 10 mg at 02/16/18 1000 .  furosemide (LASIX) injection 20 mg, 20 mg, Intravenous, Daily, Vanga, Tally Due, MD, 20 mg at 02/16/18 1850 .  [START ON 02/17/2018] furosemide (LASIX) tablet 40 mg, 40 mg, Oral, Daily, Vanga, Tally Due, MD .  levofloxacin [facility]) tablet 250 mg, 250 mg, Oral, Daily, Mayo, Pete Pelt, MD, 250 mg at 02/16/18 0956 .  levothyroxine (SYNTHROID, LEVOTHROID) tablet 100 mcg, 100 mcg, Oral, QAC breakfast, Mayo, Pete Pelt, MD, 100 mcg at 02/16/18 0524 .  megestrol (MEGACE) 400 MG/10ML suspension 300 mg, 300 mg, Oral, BID, Mayo, Pete Pelt, MD .  Melatonin TABS 5 mg, 5 mg, Oral, QHS PRN, Mayo, Pete Pelt, MD .  morphine 2 MG/ML injection 2 mg, 2 mg, Intravenous, Q4H PRN, Mayo, Pete Pelt, MD, 2 mg at  02/15/18 1347 .  multivitamin-lutein (OCUVITE-LUTEIN) capsule 1 capsule, 1 capsule, Oral, Daily, Mayo, Pete Pelt, MD, 1 capsule at 02/16/18 0955 .  ondansetron (ZOFRAN) tablet 4 mg, 4 mg, Oral, Q6H PRN **OR** ondansetron (ZOFRAN) injection 4 mg, 4 mg, Intravenous, Q6H PRN, Mayo, Pete Pelt, MD .  pantoprazole (PROTONIX) EC tablet 40 mg, 40 mg, Oral, Daily, Mayo, Pete Pelt, MD, 40 mg at 02/16/18 0955 .  polyethylene glycol (MIRALAX / GLYCOLAX) packet 17 g, 17 g, Oral, Daily PRN, Mayo, Pete Pelt, MD .  QUEtiapine (SEROQUEL) tablet 100 mg, 100 mg, Oral, QHS, Mayo, Pete Pelt, MD, 100 mg at 02/15/18 2117 .  rifaximin (XIFAXAN) tablet 550 mg, 550 mg, Oral, BID, Mayo, Pete Pelt, MD, 550 mg at 02/16/18 0955 .  spironolactone (ALDACTONE) tablet 50 mg, 50 mg, Oral, Daily, Vanga, Tally Due, MD .  traZODone (DESYREL) tablet 150 mg, 150 mg, Oral, QHS, Mayo, Pete Pelt, MD, 150 mg at 02/15/18 2116 .  vitamin C (ASCORBIC ACID) tablet 250 mg, 250 mg, Oral, BID, Mayo, Pete Pelt, MD, 250 mg at 02/16/18 1850  Family History  Problem Relation Age of Onset  . Diabetes Mother   . COPD Mother   . Kidney disease Mother   . Depression Mother   . Hypertension Mother   . Heart attack Father   . Aneurysm Father   . Anxiety disorder Sister   . Depression Sister   . Diabetes Sister   . Hypertension Sister   . Atrial fibrillation Brother   . Spinal muscular atrophy Brother   . Breast cancer Sister   . Bone cancer Sister   . Depression Sister   . Anxiety disorder Sister   . Liver cancer Sister   . Breast cancer Sister   . Colon cancer Sister   . Depression Sister   . Diabetes Sister   . COPD Sister   . Depression Sister   . Anxiety disorder Sister   . Skin cancer Sister   . Diabetes Brother   . Depression Brother   . Skin cancer Maternal Aunt   . Diabetes Maternal Aunt   . Cervical cancer Paternal Aunt      Social History   Tobacco Use  . Smoking status: Never Smoker  . Smokeless tobacco: Never  Used  Substance Use Topics  . Alcohol use: No  . Drug use: No    Allergies as of 02/13/2018 - Review Complete 02/13/2018  Allergen Reaction Noted  . Sulfa antibiotics Shortness Of Breath 11/27/2014    Review of Systems:    All  systems reviewed and negative except where noted in HPI.   Physical Exam:  Vital signs in last 24 hours: Temp:  [97.9 F (36.6 C)-99.2 F (37.3 C)] 99.2 F (37.3 C) (10/29 1957) Pulse Rate:  [83-94] 94 (10/29 1957) Resp:  [18] 18 (10/29 1957) BP: (98-110)/(43-56) 108/43 (10/29 1957) SpO2:  [93 %-94 %] 94 % (10/29 1957) Last BM Date: 02/16/18 General:   Pleasant, cooperative in NAD Head:  Normocephalic and atraumatic. Eyes:   No icterus.   Conjunctiva pink. PERRLA. Ears:  Normal auditory acuity. Neck:  Supple; no masses or thyroidomegaly Lungs: Respirations even and unlabored.  Decreased breath sounds in right side of the chest,  no wheezes, crackles, or rhonchi.  Heart:  Regular rate and rhythm;  Without murmur, clicks, rubs or gallops Abdomen:  Soft, nondistended, nontender. Normal bowel sounds. No appreciable masses or hepatomegaly.  No rebound or guarding.  Rectal:  Not performed. Msk:  Symmetrical without gross deformities.  Strength generalized weakness Extremities:  2+ edema, cyanosis or clubbing. Neurologic:  Alert and oriented x2;  grossly normal neurologically. Skin:  Intact without significant lesions or rashes. Cervical Nodes:  No significant cervical adenopathy. Psych:  Alert and cooperative. Normal affect.  LAB RESULTS: CBC Latest Ref Rng & Units 02/16/2018 02/15/2018 02/14/2018  WBC 4.0 - 10.5 K/uL 3.1(L) 3.1(L) 2.7(L)  Hemoglobin 12.0 - 15.0 g/dL 10.8(L) 11.4(L) 11.1(L)  Hematocrit 36.0 - 46.0 % 33.2(L) 35.1(L) 34.9(L)  Platelets 150 - 400 K/uL 55(L) 68(L) 69(L)    BMET BMP Latest Ref Rng & Units 02/16/2018 02/15/2018 02/14/2018  Glucose 70 - 99 mg/dL 83 100(H) 95  BUN 8 - 23 mg/dL _0 Creatinine 0.44 - 1.00 mg/dL 0.61  0.47 0.55  Sodium 135 - 145 mmol/L 141 142 142  Potassium 3.5 - 5.1 mmol/L 4.0 3.7 3.7  Chloride 98 - 111 mmol/L 110 112(H) 112(H)  CO2 22 - 32 mmol/L _1 Calcium 8.9 - 10.3 mg/dL 7.6(L) 8.1(L) 8.2(L)    LFT Hepatic Function Latest Ref Rng & Units 02/09/2018 02/01/2018 01/14/2018  Total Protein 6.5 - 8.1 g/dL 6.4(L) 6.2(L) 6.5  Albumin 3.5 - 5.0 g/dL 3.4(L) 3.3(L) 3.2(L)  AST 15 - 41 U/L 32 35 36  ALT 0 - 44 U/L _2 Alk Phosphatase 38 - 126 U/L 82 70 84  Total Bilirubin 0.3 - 1.2 mg/dL 1.5(H) 1.4(H) 1.2     STUDIES: Dg Chest 1 View  Result Date: 02/16/2018 CLINICAL DATA:  Right-sided thoracentesis yesterday for pleural effusion. Follow-up radiograph. EXAM: CHEST  1 VIEW COMPARISON:  Portable chest x-ray of February 15, 2018 FINDINGS: There is a persistent approximately 10% right apical pneumothorax. The volume of pleural fluid on the right is stable to slightly more conspicuous. There is stable soft tissue density in the right paratracheal region above the right hilum. The left lung is well-expanded and clear. There is no left pleural effusion. The heart and pulmonary vascularity are normal. There is reverse S shaped thoracolumbar scoliosis. IMPRESSION: Stable approximately 10% right apical pneumothorax. Stable to slightly increased volume of right pleural effusion. Persistent soft tissue fullness in the right paratracheal region Electronically Signed   By: David  Martinique M.D.   On: 02/16/2018 10:03   Dg Chest Port 1 View  Result Date: 02/15/2018 CLINICAL DATA:  Recurrent large right pleural effusion. Status post thoracentesis on 02/15/2018. EXAM: PORTABLE CHEST 1 VIEW COMPARISON:  1058 hours on the same day FINDINGS: Unchanged right-sided hydropneumothorax with the pneumothorax extending  to the posterior right fourth rib level. Blunting of the right lateral costophrenic angle is stable. Top-normal heart size. Aortic atherosclerosis with uncoiling. There is dextroconvex curvature  of thoracic spine. IMPRESSION: Unchanged right-sided small hydropneumothorax/ex vacuo pneumothorax. Stable cardiomegaly with aortic atherosclerosis. Electronically Signed   By: Ashley Royalty M.D.   On: 02/15/2018 18:19   Dg Chest Port 1 View  Result Date: 02/15/2018 CLINICAL DATA:  77 year old female with a history of thoracentesis EXAM: PORTABLE CHEST 1 VIEW COMPARISON:  02/13/2018, 02/11/2018 FINDINGS: Cardiomediastinal silhouette unchanged in size and contour. Improved appearance of the margins of the mediastinum after thoracentesis, with significant reduction in the fluid opacity of the right chest. Persisting blunting of the right costophrenic angle and cardiophrenic angle. Linear contour in the right suprahilar region may reflect pleural margin, with relative lucency at the apex. Left lung remains relatively well aerated. IMPRESSION: Significant decreased pleural fluid on the right status post thoracentesis. I suspect there is a small persisting air component of hydropneumothorax that was identified on present on prior CT study. Electronically Signed   By: Corrie Mckusick D.O.   On: 02/15/2018 11:26   US Thoracentesis Asp Pleural Space W/img Guide  Result Date: 02/15/2018 INDICATION: Right pleural effusion EXAM: ULTRASOUND GUIDED RIGHT THORACENTESIS MEDICATIONS: None. COMPLICATIONS: SIR Level A - No therapy, no consequence. Small right pneumothorax. PROCEDURE: An ultrasound guided thoracentesis was thoroughly discussed with the patient and questions answered. The benefits, risks, alternatives and complications were also discussed. The patient understands and wishes to proceed with the procedure. Written consent was obtained. Ultrasound was performed to localize and mark an adequate pocket of fluid in the right chest. The area was then prepped and draped in the normal sterile fashion. 1% Lidocaine was used for local anesthesia. Under ultrasound guidance a 6 Fr Safe-T-Centesis catheter was introduced.  Thoracentesis was performed. The catheter was removed and a dressing applied. FINDINGS: A total of approximately 2 L of clear yellow fluid was removed. IMPRESSION: Successful ultrasound guided right thoracentesis yielding 2 L of pleural fluid. Electronically Signed   By: Marybelle Killings M.D.   On: 02/15/2018 12:02      Impression / Plan:   Cassidy [name] is a 77 y.o. female with obesity, fatty liver, hypothyroidism with chronic liver disease, splenomegaly, chronic thrombocytopenia and portal hypertension who is admitted with recurrent right-sided pleural effusions, status post therapeutic thoracentesis.  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  Recommend repeat EGD as outpatient for variceal screening given recent decompensation PSE: Continue rifaximin  [place]  screening: No evidence of liver lesions based on the CT scan  Iron deficiency anemia Secondary to chronic blood loss in the setting of cirrhosis Recheck iron stores, B12 and folate levels  Thank you for involving me in the care of this patient.  Will follow along with you    LOS: 3 days   Sherri Sear, MD  02/16/2018, 8:19 PM   Note: This dictation was prepared with Dragon dictation along with smaller phrase technology. Any transcriptional errors that result from this process are unintentional.

## 2018-02-16 NOTE — Care Management (Signed)
Patient readmitted with recurrent pleural effusions.  On 10/24 patient was discharged to her sisters home.  At baseline patient lives at home with her grandson.  PCP Ouida Sills.  [facility]  Patient open with [facility].  [place] with [facility] aware of admission.  Patient has RW, WC, cane, and raised toilet seat in the home.  RNCM following

## 2018-02-17 ENCOUNTER — Inpatient Hospital Stay: Payer: Medicare Other

## 2018-02-17 DIAGNOSIS — K7290 Hepatic failure, unspecified without coma: Secondary | ICD-10-CM

## 2018-02-17 LAB — CBC
HCT: 32.8 % — ABNORMAL LOW (ref 36.0–46.0)
HEMOGLOBIN: 10.7 g/dL — AB (ref 12.0–15.0)
MCH: 29.8 pg (ref 26.0–34.0)
MCHC: 32.6 g/dL (ref 30.0–36.0)
MCV: 91.4 fL (ref 80.0–100.0)
NRBC: 0 % (ref 0.0–0.2)
Platelets: 65 10*3/uL — ABNORMAL LOW (ref 150–400)
RBC: 3.59 MIL/uL — ABNORMAL LOW (ref 3.87–5.11)
RDW: 16.8 % — ABNORMAL HIGH (ref 11.5–15.5)
WBC: 3 10*3/uL — AB (ref 4.0–10.5)

## 2018-02-17 LAB — VITAMIN B12: VITAMIN B 12: 427 pg/mL (ref 180–914)

## 2018-02-17 LAB — BASIC METABOLIC PANEL
ANION GAP: 6 (ref 5–15)
BUN: 13 mg/dL (ref 8–23)
CALCIUM: 7.8 mg/dL — AB (ref 8.9–10.3)
CHLORIDE: 106 mmol/L (ref 98–111)
CO2: 25 mmol/L (ref 22–32)
CREATININE: 0.65 mg/dL (ref 0.44–1.00)
GFR calc Af Amer: >60 mL/min (ref >60)
GFR calc non Af Amer: >60 mL/min (ref >60)
Glucose, Bld: 99 mg/dL (ref 70–99)
Potassium: 3.8 mmol/L (ref 3.5–5.1)
SODIUM: 137 mmol/L (ref 135–145)

## 2018-02-17 MED ORDER — SPIRONOLACTONE 25 MG PO TABS
100.0000 mg | ORAL_TABLET | Freq: Every day | ORAL | Status: DC
  Administered 2018-02-18: 100 mg via ORAL
  Filled 2018-02-17: qty 4

## 2018-02-17 NOTE — Progress Notes (Signed)
Physical Therapy Treatment Patient Details Name: Cassidy [name] MRN: [id number] DOB: 11/03/1940 Today's Date: 02/17/2018    History of Present Illness Pt is a 77 y/o F who presented with SOB due to recurrent large R pleural effusion s/p thoracentesis 10/28.  Pt with hospital stay just ~2 days PTA for R pleural effusion and chronic thrombocytopenia s/p thoracentesis during that hospital stay.  Pt's PMH includes dementia, chronic L2 compression fx, anxiety, depression, orthostasis.     PT Comments    Pt agreeable to PT; denies pain, but reports general malaise. Pt wishes to take a walk. Pt performs bed mobility with Mod I with increased time. Min guard for STS and Min A to walk for body and feet placement within parameters of rolling walker throughout walk. Pt ambulates very slowly (0.4 ft/sec) for 30 ft with fatigue. Wishes to return to bed versus up in chair and does not wish any further PT at this time due to fatigue. Continue PT to progress strength and endurance to improve all functional mobility.    Follow Up Recommendations  Home health PT;Supervision for mobility/OOB     Equipment Recommendations  None recommended by PT    Recommendations for Other Services       Precautions / Restrictions Precautions Precautions: Fall;Other (comment) Restrictions Weight Bearing Restrictions: No    Mobility  Bed Mobility Overal bed mobility: Modified Independent Bed Mobility: Supine to Sit;Sit to Supine     Supine to sit: Modified independent (Device/Increase time) Sit to supine: Modified independent (Device/Increase time)   General bed mobility comments: increased time; Mod A to reposition upward in bed  Transfers Overall transfer level: Needs assistance Equipment used: Rolling walker (2 wheeled) Transfers: Sit to/from Stand Sit to Stand: Min guard         General transfer comment: cues for hand placement; slow to rise; controlled sit  Ambulation/Gait Ambulation/Gait  assistance: Min assist Gait Distance (Feet): 30 Feet Assistive device: Rolling walker (2 wheeled) Gait Pattern/deviations: Step-through pattern;Trunk flexed;Narrow base of support(Partial step through) Gait velocity: slow Gait velocity interpretation: <1.31 ft/sec, indicative of household ambulator(0.4 ft/sec) General Gait Details: Requires assist to manuever rw; cues for feet/body position within parameters of rw    Stairs             Wheelchair Mobility    Modified Rankin (Stroke Patients Only)       Balance Overall balance assessment: Needs assistance Sitting-balance support: Bilateral upper extremity supported;Feet supported Sitting balance-Leahy Scale: Fair     Standing balance support: Bilateral upper extremity supported Standing balance-Leahy Scale: Fair                              Cognition Arousal/Alertness: Awake/alert Behavior During Therapy: WFL for tasks assessed/performed Overall Cognitive Status: Within Functional Limits for tasks assessed                                        Exercises      General Comments        Pertinent Vitals/Pain Pain Assessment: No/denies pain    Home Living                      Prior Function            PT Goals (current goals can now be found in the care plan  section) Progress towards PT goals: Progressing toward goals    Frequency    Min 2X/week      PT Plan Current plan remains appropriate    Co-evaluation              AM-PAC PT "6 Clicks" Daily Activity  Outcome Measure  Difficulty turning over in bed (including adjusting bedclothes, sheets and blankets)?: A Little Difficulty moving from lying on back to sitting on the side of the bed? : None Difficulty sitting down on and standing up from a chair with arms (e.g., wheelchair, bedside commode, etc,.)?: Unable Help needed moving to and from a bed to chair (including a wheelchair)?: A Little Help needed  walking in hospital room?: A Little Help needed climbing 3-5 steps with a railing? : A Lot 6 Click Score: 16    End of Session Equipment Utilized During Treatment: Gait belt Activity Tolerance: Patient limited by fatigue;Other (comment)(weakness) Patient left: in bed;with call bell/phone within reach;with bed alarm set;with family/visitor present;Other (comment)(refuses up in chair)   PT Visit Diagnosis: Unsteadiness on feet (R26.81);Other abnormalities of gait and mobility (R26.89);Muscle weakness (generalized) (M62.81)     Time: 4166-0630 PT Time Calculation (min) (ACUTE ONLY): 21 min  Charges:  $Gait Training: 8-22 mins                      Larae Grooms, PTA 02/17/2018, 12:02 PM

## 2018-02-17 NOTE — Progress Notes (Signed)
OT Cancellation Note  Patient Details Name: Cassidy [name] MRN: [id number] DOB: May 29, 1940   Cancelled Treatment:    Reason Eval/Treat Not Completed: Patient at procedure or test/ unavailable. Pt getting imaging upon attempt to treat. Will re-attempt at later date/time as pt is available and medically appropriate.   Jeni Salles, MPH, MS, OTR/L ascom [phone] 02/17/18, 10:36 AM

## 2018-02-17 NOTE — Progress Notes (Signed)
   02/17/18 1400  Clinical Encounter Type  Visited With Patient  Visit Type Follow-up (AD Completion)  Recommendations Follow-up when [name] is present.   At the request of the patient's nurse, Chaplain tried a third time to complete the AD today. Chaplain secured the services of a notary and witnesses and went to the patient's room, but the patient refused to sign the document unless her sister [name] was present. Chaplain understood the patient's position and did not continue the conversation. Chaplain was specific in previous encounter that due to notary availability the document may be notarized while [name] was not present. Chaplain will attempt when [name] is present.

## 2018-02-17 NOTE — Progress Notes (Signed)
   02/17/18 1300  Clinical Encounter Type  Visited With Patient;Family (Sister [name])  Visit Type Follow-up (AD completion.)  Recommendations Follow-up to complete documentation.  Spiritual Encounters  Spiritual Needs Emotional  Stress Factors  Patient Stress Factors Health changes  Family Stress Factors Exhausted  Advance Directives (For Healthcare)  Does Patient Have a Medical Advance Directive?  (Patient wishes to name sister [name] as Chauncey Reading.)   Note 1: Chaplain provided explanation of AD and answers questions from the patient and her sister [name]. Patient was lucid but was beginning to show signs of forgetfulness. There was urgency in completing the AD based on the patient's ability to make informed decisions and her sister's schedule. Unfortunately, Chaplain could not complete the AD due to previous obligation.  Note 2: Chaplain assisted family in completing the HCPOA and facilitated a conversation around the patient's EOL choices. Patient acquiesces to her sister [name] and decided to allow her to make EOL decisions. Chaplain attempted completion but could not locate a notary after visiting four clinical units. Chaplain will follow-up later.

## 2018-02-17 NOTE — Care Management Important Message (Signed)
Copy of signed IM left with patient in room.  

## 2018-02-17 NOTE — Progress Notes (Signed)
Cassidy Darby, MD [address]  [place]  [place], [place] [address]  Main: [phone]  Fax: [phone] Pager: [phone]   Subjective: No acute events overnight.  Patient denies shortness of breath, chest pain or fever.  She received Lasix.  She has been having good urine output.  Patient sister is bedside has questions about pleural effusion and role of TIPS.   Objective: Vital signs in last 24 hours: Vitals:   02/17/18 0458 02/17/18 0939 02/17/18 1053 02/17/18 1142  BP: (!) 122/51 (!) 127/48  (!) 116/47  Pulse: 85 91 86 87  Resp: 16 (!) 33  17  Temp: 98.4 F (36.9 C) 98.6 F (37 C)  98.4 F (36.9 C)  TempSrc: Oral Oral  Oral  SpO2: 92% 95% 94% 94%  Weight:      Height:       Weight change:   Intake/Output Summary (Last 24 hours) at 02/17/2018 1611 Last data filed at 02/17/2018 1328 Gross per 24 hour  Intake -  Output 800 ml  Net -800 ml     Exam: Heart:: Regular rate and rhythm or S1S2 present Lungs: normal and clear to auscultation Abdomen: soft, nontender, normal bowel sounds   Lab Results: @LABTEST2 @ Micro Results: Recent Results (from the past 240 hour(s))  Body fluid culture     Status: None (Preliminary result)   Collection Time: 02/15/18 10:40 AM  Result Value Ref Range Status   Specimen Description   Final    PLEURAL Performed at [facility], [address], [place], [place] [address]    Special Requests   Final    NONE Performed at [facility], [address] [place], [place] [address]    Gram Stain   Final    RARE WBC PRESENT, PREDOMINANTLY MONONUCLEAR NO ORGANISMS SEEN    Culture   Final    NO GROWTH 2 DAYS Performed at [facility], [place] [address], [place], [place] [address]    Report Status PENDING  Incomplete   Studies/Results: Dg Chest 1 View  Result Date: 02/17/2018 CLINICAL DATA:  Post thoracentesis EXAM: CHEST  1 VIEW COMPARISON:  02/16/2018 FINDINGS: Continued moderate right  pleural effusion. No visible pneumothorax following thoracentesis. Cardiomegaly. Mild vascular congestion. Right base atelectasis. IMPRESSION: Continued moderate right pleural effusion and right base atelectasis. No visible pneumothorax. Cardiomegaly, vascular congestion. Electronically Signed   By: Rolm Baptise M.D.   On: 02/17/2018 10:34   Dg Chest 1 View  Result Date: 02/16/2018 CLINICAL DATA:  Right-sided thoracentesis yesterday for pleural effusion. Follow-up radiograph. EXAM: CHEST  1 VIEW COMPARISON:  Portable chest x-ray of February 15, 2018 FINDINGS: There is a persistent approximately 10% right apical pneumothorax. The volume of pleural fluid on the right is stable to slightly more conspicuous. There is stable soft tissue density in the right paratracheal region above the right hilum. The left lung is well-expanded and clear. There is no left pleural effusion. The heart and pulmonary vascularity are normal. There is reverse S shaped thoracolumbar scoliosis. IMPRESSION: Stable approximately 10% right apical pneumothorax. Stable to slightly increased volume of right pleural effusion. Persistent soft tissue fullness in the right paratracheal region Electronically Signed   By: David  Martinique M.D.   On: 02/16/2018 10:03   Dg Chest Port 1 View  Result Date: 02/15/2018 CLINICAL DATA:  Recurrent large right pleural effusion. Status post thoracentesis on 02/15/2018. EXAM: PORTABLE CHEST 1 VIEW COMPARISON:  1058 hours on the same day FINDINGS:  Unchanged right-sided hydropneumothorax with the pneumothorax extending to the posterior right fourth rib level. Blunting of the right lateral costophrenic angle is stable. Top-normal heart size. Aortic atherosclerosis with uncoiling. There is dextroconvex curvature of thoracic spine. IMPRESSION: Unchanged right-sided small hydropneumothorax/ex vacuo pneumothorax. Stable cardiomegaly with aortic atherosclerosis. Electronically Signed   By: Ashley Royalty M.D.   On:  02/15/2018 18:19   Medications: I have reviewed the patient's current medications. Scheduled Meds: . busPIRone  15 mg Oral QHS  . Copper Gluconate  2 mg Oral Daily  . escitalopram  10 mg Oral Daily  . furosemide  40 mg Oral Daily  . levothyroxine  100 mcg Oral QAC breakfast  . megestrol  300 mg Oral BID  . multivitamin-lutein  1 capsule Oral Daily  . pantoprazole  40 mg Oral Daily  . QUEtiapine  100 mg Oral QHS  . rifaximin  550 mg Oral BID  . spironolactone  50 mg Oral Daily  . traZODone  150 mg Oral QHS  . 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

## 2018-02-17 NOTE — Progress Notes (Signed)
[facility] [place] NAME: Cassidy [name]    MR#:  [id number]  DATE OF BIRTH:  May 25, 1940  SUBJECTIVE:   Doing fine this morning.  Denies any shortness of breath.  Has gotten up and walked around without any dyspnea on exertion.  REVIEW OF SYSTEMS:    Review of Systems  Constitutional: Negative for fever, chills weight loss HENT: Negative for ear pain, nosebleeds, congestion, facial swelling, rhinorrhea, and ear discharge. +neck pain, negative for neck stiffness Respiratory: Negative for shortness of breath, no wheezing or cough Cardiovascular: Negative for chest pain, palpitations and leg swelling.  Gastrointestinal: Negative for heartburn, abdominal pain, vomiting, diarrhea or consitpation Genitourinary: Negative for dysuria, urgency, frequency, hematuria Musculoskeletal: Negative for joint pain, + back pain Neurological: Negative for dizziness, seizures, syncope, focal weakness,  numbness and headaches.  Hematological: Does not bruise/bleed easily.  Psychiatric/Behavioral: Negative for hallucinations, confusion, dysphoric mood  Tolerating Diet: yes  DRUG ALLERGIES:   Allergies  Allergen Reactions  . Sulfa Antibiotics Shortness Of Breath    VITALS:  Blood pressure (!) 116/47, pulse 87, temperature 98.4 F (36.9 C), temperature source Oral, resp. rate 17, height 5' 2"  (1.575 m), weight 59 kg, SpO2 94 %.  PHYSICAL EXAMINATION:  Constitutional: Appears well-developed and well-nourished. No distress. HENT: Normocephalic. atraumatic. Oropharynx is clear and moist.  Eyes: Conjunctivae and EOM are normal. PERRLA, no scleral icterus.  Neck: Normal ROM. Neck supple. No JVD. No tracheal deviation. CVS: RRR, S1/S2 +, no murmurs, no gallops, no carotid bruit.  Pulmonary: Clear to auscultation bilaterally except for diminished lung sounds in the right lung base extending up to the mid lung, no rales, rhonchi or wheezing, normal work of  breathing Abdominal: Soft. BS +,  no distension, tenderness, rebound or guarding.  Musculoskeletal: Normal range of motion. No edema and no tenderness.  Neuro: Alert. CN 2-12 grossly intact. No focal deficits. Skin: Skin is warm and dry. No rash noted. Psychiatric: Normal mood and affect.   LABORATORY PANEL:   CBC Recent Labs  Lab 02/17/18 0435  WBC 3.0*  HGB 10.7*  HCT 32.8*  PLT 65*   ------------------------------------------------------------------------------------------------------------------  Chemistries  Recent Labs  Lab 02/17/18 0435  NA 137  K 3.8  CL 106  CO2 25  GLUCOSE 99  BUN 13  CREATININE 0.65  CALCIUM 7.8*   ------------------------------------------------------------------------------------------------------------------  Cardiac Enzymes Recent Labs  Lab 02/13/18 1418  TROPONINI <0.03   ------------------------------------------------------------------------------------------------------------------  RADIOLOGY:  Dg Chest 1 View  Result Date: 02/17/2018 CLINICAL DATA:  Post thoracentesis EXAM: CHEST  1 VIEW COMPARISON:  02/16/2018 FINDINGS: Continued moderate right pleural effusion. No visible pneumothorax following thoracentesis. Cardiomegaly. Mild vascular congestion. Right base atelectasis. IMPRESSION: Continued moderate right pleural effusion and right base atelectasis. No visible pneumothorax. Cardiomegaly, vascular congestion. Electronically Signed   By: Rolm Baptise M.D.   On: 02/17/2018 10:34   Dg Chest 1 View  Result Date: 02/16/2018 CLINICAL DATA:  Right-sided thoracentesis yesterday for pleural effusion. Follow-up radiograph. EXAM: CHEST  1 VIEW COMPARISON:  Portable chest x-ray of February 15, 2018 FINDINGS: There is a persistent approximately 10% right apical pneumothorax. The volume of pleural fluid on the right is stable to slightly more conspicuous. There is stable soft tissue density in the right paratracheal region above the right  hilum. The left lung is well-expanded and clear. There is no left pleural effusion. The heart and pulmonary vascularity are normal. There is reverse S shaped thoracolumbar scoliosis. IMPRESSION: Stable approximately 10%  right apical pneumothorax. Stable to slightly increased volume of right pleural effusion. Persistent soft tissue fullness in the right paratracheal region Electronically Signed   By: David  Martinique M.D.   On: 02/16/2018 10:03   Dg Chest Port 1 View  Result Date: 02/15/2018 CLINICAL DATA:  Recurrent large right pleural effusion. Status post thoracentesis on 02/15/2018. EXAM: PORTABLE CHEST 1 VIEW COMPARISON:  1058 hours on the same day FINDINGS: Unchanged right-sided hydropneumothorax with the pneumothorax extending to the posterior right fourth rib level. Blunting of the right lateral costophrenic angle is stable. Top-normal heart size. Aortic atherosclerosis with uncoiling. There is dextroconvex curvature of thoracic spine. IMPRESSION: Unchanged right-sided small hydropneumothorax/ex vacuo pneumothorax. Stable cardiomegaly with aortic atherosclerosis. Electronically Signed   By: Ashley Royalty M.D.   On: 02/15/2018 18:19    ASSESSMENT AND PLAN:   77 year old female with discharge from the hospital a few days ago due to pneumonia and large right pleural effusion status post thoracentesis who presents to the emergency room due to shortness of breath.  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  Chest x-ray this morning with continued moderate right pleural effusion. -Pulmonology consulted-feel that her effusion is related to her portal hypertension -GI consulted-patient started on Lasix and spironolactone yesterday, will consider TIPS procedure if medications do not work -Daily chest x-ray -May need another thoracentesis prior to discharge  CAP: Patient was discharged on 5-day course of Levaquin on 10/23 -Will finish course of Levaquin  today  Hypothyroid: stable -Continue Synthroid  Depression and anxiety: Stable -Continue Seroquel, trazodone Lexapro, Buspar and Klonopin  Liver cirrhosis based on CT imaging: No history of alcohol abuse.  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 -GI consult -Continue Xifaxan -Started on Lasix and spironolactone  Nonocclusive thrombus in the splenic vein seen on previous CT scan of the abdomen and pelvis: Patient is not a candidate for anticoagulation due to severe thrombocytopenia   Chronic thrombocytopenia- platelets stable -Hold anticoagulation  Management plans discussed with the patient and grandson and they are in agreement.  CODE STATUS: Full  TOTAL TIME TAKING CARE OF THIS PATIENT: 35 minutes.    POSSIBLE D/C 1-2 days, DEPENDING ON CLINICAL CONDITION.   Berna Spare Naveen Lorusso M.D on 02/17/2018 at 1:52 PM  Between 7am to 6pm - Pager [phone] After 6pm go to [email or web address] - password [email or web address] [facility]  Office  [phone]  CC: Primary care physician; Kirk Ruths, MD  Note: This dictation was prepared with Dragon dictation along with smaller phrase technology. Any transcriptional errors that result from this process are unintentional.

## 2018-02-18 ENCOUNTER — Inpatient Hospital Stay: Payer: Medicare Other

## 2018-02-18 LAB — BASIC METABOLIC PANEL
Anion gap: 6 (ref 5–15)
BUN: 11 mg/dL (ref 8–23)
CHLORIDE: 108 mmol/L (ref 98–111)
CO2: 25 mmol/L (ref 22–32)
Calcium: 8 mg/dL — ABNORMAL LOW (ref 8.9–10.3)
Creatinine, Ser: 0.55 mg/dL (ref 0.44–1.00)
GFR calc Af Amer: >60 mL/min (ref >60)
GFR calc non Af Amer: >60 mL/min (ref >60)
Glucose, Bld: 100 mg/dL — ABNORMAL HIGH (ref 70–99)
POTASSIUM: 3.8 mmol/L (ref 3.5–5.1)
SODIUM: 139 mmol/L (ref 135–145)

## 2018-02-18 LAB — CBC
HCT: 33.1 % — ABNORMAL LOW (ref 36.0–46.0)
HEMOGLOBIN: 11 g/dL — AB (ref 12.0–15.0)
MCH: 30 pg (ref 26.0–34.0)
MCHC: 33.2 g/dL (ref 30.0–36.0)
MCV: 90.2 fL (ref 80.0–100.0)
Platelets: 56 10*3/uL — ABNORMAL LOW (ref 150–400)
RBC: 3.67 MIL/uL — AB (ref 3.87–5.11)
RDW: 16.6 % — ABNORMAL HIGH (ref 11.5–15.5)
WBC: 3 10*3/uL — ABNORMAL LOW (ref 4.0–10.5)
nRBC: 0 % (ref 0.0–0.2)

## 2018-02-18 LAB — TSH: TSH: 1.545 u[IU]/mL (ref 0.350–4.500)

## 2018-02-18 LAB — T4, FREE: FREE T4: 1.29 ng/dL (ref 0.82–1.77)

## 2018-02-18 MED ORDER — MEGESTROL ACETATE 400 MG/10ML PO SUSP: 300.0000 mg | Freq: Two times a day (BID) | ORAL | 0 refills | Status: DC

## 2018-02-18 MED ORDER — OXYCODONE HCL 5 MG PO TABS: 5.0000 mg | ORAL_TABLET | Freq: Four times a day (QID) | ORAL | 0 refills | Status: DC | PRN

## 2018-02-18 MED ORDER — FUROSEMIDE 40 MG PO TABS: 40.0000 mg | ORAL_TABLET | Freq: Every day | ORAL | 0 refills | Status: DC

## 2018-02-18 MED ORDER — SPIRONOLACTONE 100 MG PO TABS: 100.0000 mg | ORAL_TABLET | Freq: Every day | ORAL | 0 refills | Status: DC

## 2018-02-18 MED ORDER — LORAZEPAM 2 MG/ML IJ SOLN
1.0000 mg | Freq: Once | INTRAMUSCULAR | Status: AC | PRN
  Administered 2018-02-18: 1 mg via INTRAVENOUS
  Filled 2018-02-18: qty 1

## 2018-02-18 NOTE — Care Management Note (Signed)
Case Management Note  Patient Details  Name: TILLEY FAETH MRN: [id number] Date of Birth: [birth year]   Patient to discharge today.  Resumption home health orders are in.  [place] with [facility] notified. Patient to discharge to her sister address.  This information has been provided to [facility].   Subjective/Objective:                    Action/Plan:   Expected Discharge Date:  02/18/18               Expected Discharge Plan:  [place]  In-House Referral:     Discharge planning Services  CM Consult  Post Acute Care Choice:  Home Health, Resumption of Svcs/PTA Provider Choice offered to:  Patient  DME Arranged:    DME Agency:     HH Arranged:  RN, PT, OT HH Agency:  [facility]  Status of Service:  Completed, signed off  If discussed at [place] of Stay Meetings, dates discussed:    Additional Comments:  Beverly Sessions, RN 02/18/2018, 3:22 PM

## 2018-02-18 NOTE — Discharge Instructions (Signed)
It was so nice to meet you during this hospitalization!  You came into the hospital because you kept having fluid around your lung. We think this fluid is coming from your liver. We started you on two medications to help keep the fluid off: 1. Please take Lasix 59m once daily 2. Please take Spironolactone 1053monce daily 3. You should STOP taking the potassium at home because spironolactone will increase your potassium level  You should follow-up with Dr. VaMarius Ditchn the GI office in the next week.  I have also changed your megace dose to 7.55m43m300m41mwice a day, because this is the correct dose for appetite stimulation.  -Dr. MayoBrett Albino

## 2018-02-18 NOTE — Progress Notes (Signed)
Pt alert and oriented, VSS. Prescription given to patient.Patient given discharge information.All questions answered.  Removed Iv from pt. Patient discharged to sister's home via w/c with nurse tech and family with all belongings.

## 2018-02-18 NOTE — Procedures (Signed)
Pre procedural Dx: Symptomatic Pleural effusion Post procedural Dx: Same  Successful US guided right sided thoracentesis yielding 1.4 L of serous pleural fluid.   Samples sent to lab for analysis.  EBL: None  Complications: None immediate.  Ronny Bacon, MD Pager #: [phone]

## 2018-02-18 NOTE — Progress Notes (Signed)
OT Cancellation Note  Patient Details Name: LEVANA MINETTI MRN: [id number] DOB: 1940-08-27   Cancelled Treatment:    Reason Eval/Treat Not Completed: Patient at procedure or test/ unavailable. Pt receiving thoracentesis, unavailable for OT treatment. Will re-attempt at later date/time as pt is available and medically appropriate.   Jeni Salles, MPH, MS, OTR/L ascom [phone] 02/18/18, 9:49 AM

## 2018-02-18 NOTE — Discharge Summary (Signed)
[facility] [place] NAME: Haivyn Oravec    MR#:  [id number]  DATE OF BIRTH:  08/03/1940  DATE OF ADMISSION:  02/13/2018   ADMITTING PHYSICIAN: Sela Hua, MD  DATE OF DISCHARGE: 02/18/18  PRIMARY CARE PHYSICIAN: Kirk Ruths, MD   ADMISSION DIAGNOSIS:  Pleural effusion on right [J90] Dyspnea, unspecified type [R06.00] DISCHARGE DIAGNOSIS:  Active Problems:   Pleural effusion on right  SECONDARY DIAGNOSIS:   Past Medical History:  Diagnosis Date  . Anxiety   . Depression   . Fatty liver   . GERD (gastroesophageal reflux disease)   . Hypothyroidism   . Obesity   . Thyroid disease    HOSPITAL COURSE:   Ramsha is a 77 year old female who presented to the ED with recurrent right pleural effusion.  She was just admitted and discharged 1 day prior for the same issue after receiving thoracentesis.  She was admitted for further management.  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  -Pulmonology consulted- felt that her effusion is related to her portal hypertension -GI consulted-patient started on Lasix and spironolactone this admission, will consider TIPS procedure if medications do not work -CXR on the day of discharge improved from prior -Patient will follow-up with GI as an outpatient  CAP: Patient was discharged on 5-day course of Levaquin on 10/23, which she completed on 10/30.  Hypothyroid: stable -Continued Synthroid  Depression and anxiety: Stable -Continued Seroquel, trazodone Lexapro, Buspar and Klonopin  Liver cirrhosis based on CT imaging: No history of alcohol abuse.  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 Sulfa Antibiotics Shortness Of Breath   DISCHARGE MEDICATIONS:   Allergies as of 02/18/2018      Reactions   Sulfa Antibiotics Shortness Of Breath      Medication List    STOP taking these medications   levofloxacin 500 MG tablet Commonly known as:  LEVAQUIN   megestrol 20 MG tablet Commonly known as:  MEGACE Replaced by:  megestrol 400 MG/10ML suspension   potassium chloride 10 MEQ tablet Commonly known as:  K-DUR     TAKE these medications   acidophilus Caps capsule Take 1 capsule by mouth 2 (two) times daily.   ascorbic acid 250 MG tablet Commonly known as:  VITAMIN C Take 1 tablet (250 mg total) by mouth 2 (two) times daily.   busPIRone 15 MG tablet Commonly known as:  BUSPAR Take 1 tablet (15 mg total) by mouth at bedtime.   clonazePAM 0.5 MG tablet Commonly known as:  KLONOPIN Take 1 tablet (0.5 mg total) by mouth daily as needed for anxiety. Only for severe anxiety sx   Copper Gluconate 2 MG Tabs Take 1 tablet (2 mg total) by mouth daily.   escitalopram 10 MG tablet Commonly known as:  LEXAPRO Take 1 tablet (10 mg total) by mouth daily.   furosemide 40 MG tablet Commonly known as:  LASIX Take 1 tablet (  40 mg total) by mouth daily. Start taking on:  02/19/2018   levothyroxine 100 MCG tablet Commonly known as:  SYNTHROID, LEVOTHROID Take 1 tablet (100 mcg total) by mouth daily before breakfast.   loperamide 2 MG capsule Commonly known as:  IMODIUM Take 2 mg by mouth as needed for diarrhea or loose stools.   megestrol 400 MG/10ML  suspension Commonly known as:  MEGACE Take 7.5 mLs (300 mg total) by mouth 2 (two) times daily. Replaces:  megestrol 20 MG tablet   Melatonin 1 MG Tabs Take 5 mg by mouth at bedtime as needed (for sleep).   omeprazole 40 MG capsule Commonly known as:  PRILOSEC Take 1 capsule (40 mg total) by mouth daily.   ondansetron 8 MG disintegrating tablet Commonly known as:  ZOFRAN-ODT Take 8 mg by mouth every 8 (eight) hours as needed for nausea or vomiting.   QUEtiapine 100 MG tablet Commonly known as:  SEROQUEL Take 1 tablet (100 mg total) by mouth at bedtime.   spironolactone 100 MG tablet Commonly known as:  ALDACTONE Take 1 tablet (100 mg total) by mouth daily. Start taking on:  02/19/2018   traZODone 150 MG tablet Commonly known as:  DESYREL Take 1 tablet (150 mg total) by mouth at bedtime.   traZODone 50 MG tablet Commonly known as:  DESYREL Take 1 tablet (50 mg total) by mouth at bedtime as needed for sleep. To be combined with 150 mg as needed   XIFAXAN 550 MG Tabs tablet Generic drug:  rifaximin Take 1 tablet by mouth 2 (two) times daily.        DISCHARGE INSTRUCTIONS:  1.  Follow-up with PCP in 1 to 2 weeks 2.  Follow-up with GI in 1 to 2 weeks 3.  Patient started on Lasix and spironolactone this admission.  Recommend rechecking BMP as an outpatient. 4.  Changed Megace dose to 300 mg twice daily for appetite stimulation DIET:  Cardiac diet DISCHARGE CONDITION:  Stable ACTIVITY:  Activity as tolerated OXYGEN:  Home Oxygen: No.  Oxygen Delivery: room air DISCHARGE LOCATION:  home   If you experience worsening of your admission symptoms, develop shortness of breath, life threatening emergency, suicidal or homicidal thoughts you must seek medical attention immediately by calling 911 or calling your MD immediately  if symptoms less severe.  You Must read complete instructions/literature along with all the possible adverse reactions/side effects for all the  Medicines you take and that have been prescribed to you. Take any new Medicines after you have completely understood and accpet all the possible adverse reactions/side effects.   Please note  You were cared for by a hospitalist during your hospital stay. If you have any questions about your discharge medications or the care you received while you were in the hospital after you are discharged, you can call the unit and asked to speak with the hospitalist on call if the hospitalist that took care of you is not available. Once you are discharged, your primary care physician will handle any further medical issues. Please note that NO REFILLS for any discharge medications will be authorized once you are discharged, as it is imperative that you return to your primary care physician (or establish a relationship with a primary care physician if you do not have one) for your aftercare needs so that they can reassess your need for medications and monitor your lab values.    On the day of Discharge:  VITAL SIGNS:  Blood pressure (!) 115/55, pulse  95, temperature 98.7 F (37.1 C), temperature source Oral, resp. rate 20, height 5' 2"  (1.575 m), weight 59 kg, SpO2 96 %. PHYSICAL EXAMINATION:  GENERAL:  76 y.o.-year-old patient lying in the bed with no acute distress.  EYES: Pupils equal, round, reactive to light and accommodation. No scleral icterus. Extraocular muscles intact.  HEENT: Head atraumatic, normocephalic. Oropharynx and nasopharynx clear.  NECK:  Supple, no jugular venous distention. No thyroid enlargement, no tenderness.  LUNGS: +diminished air movement in the right lung base, no wheezing, rales,rhonchi or crepitation. No use of accessory muscles of respiration.  CARDIOVASCULAR: S1, S2 normal. No murmurs, rubs, or gallops.  ABDOMEN: Soft, non-tender, non-distended. Bowel sounds present. No organomegaly or mass.  EXTREMITIES: No pedal edema, cyanosis, or clubbing.  NEUROLOGIC: Cranial nerves II  through XII are intact. Muscle strength 5/5 in all extremities. Sensation intact. Gait not checked.  PSYCHIATRIC: The patient is alert and oriented x 3.  SKIN: No obvious rash, lesion, or ulcer.  DATA REVIEW:   CBC Recent Labs  Lab 02/18/18 0413  WBC 3.0*  HGB 11.0*  HCT 33.1*  PLT 56*    Chemistries  Recent Labs  Lab 02/18/18 0413  NA 139  K 3.8  CL 108  CO2 25  GLUCOSE 100*  BUN 11  CREATININE 0.55  CALCIUM 8.0*     Microbiology Results  Results for orders placed or performed during the hospital encounter of 02/13/18  Body fluid culture     Status: None (Preliminary result)   Collection Time: 02/15/18 10:40 AM  Result Value Ref Range Status   Specimen Description   Final    PLEURAL Performed at [facility], [address] [place], [place] [address]    Special Requests   Final    NONE Performed at [facility], [address] [place], [place] [address]    Gram Stain   Final    RARE WBC PRESENT, PREDOMINANTLY MONONUCLEAR NO ORGANISMS SEEN    Culture   Final    NO GROWTH 3 DAYS Performed at [facility], [place] [address], [place], [place] [address]    Report Status PENDING  Incomplete    RADIOLOGY:  Dg Chest 1 View  Result Date: 02/18/2018 CLINICAL DATA:  Follow-up pleural effusion.  Respiratory distress. EXAM: CHEST  1 VIEW COMPARISON:  02/17/2018 and older exams. FINDINGS: Moderate right pleural effusion with associated atelectasis unchanged allowing for differences in patient positioning. Remainder of the lungs is clear. Heart is borderline enlarged.  No mediastinal or hilar masses. Small right pneumothorax. This is slightly smaller than it was on the study dated 02/16/2018. IMPRESSION: 1. Small right pneumothorax. 2. Persistent moderate right effusion with atelectasis. No new areas of lung opacity. Electronically Signed   By: Lajean Manes M.D.   On: 02/18/2018 06:42   Dg Chest Port 1 View  Result Date: 02/18/2018 CLINICAL  DATA:  Post right-sided thoracentesis. Patient with history of persistent right-sided ex vacuo pneumothorax. EXAM: PORTABLE CHEST 1 VIEW COMPARISON:  02/18/2018; 02/16/2018; ultrasound-guided thoracentesis-02/15/2018; chest CT-02/10/2018 FINDINGS: Grossly unchanged cardiac silhouette and mediastinal contours. Interval reduction in persistent trace right-sided effusion post thoracentesis. Grossly unchanged small right apical pneumothorax. No mediastinal shift. Pulmonary vasculature remains indistinct with cephalization of flow. No new focal airspace opacities. No acute osseus abnormalities. IMPRESSION: 1. Interval reduction in persistent trace right-sided effusion post thoracentesis. 2. Grossly unchanged small right apical pneumothorax as demonstrated on multiple previous examinations dating back to chest CT performed 02/10/2018 Electronically Signed  By: Sandi Mariscal M.D.   On: 02/18/2018 10:34   US Thoracentesis Asp Pleural Space W/img Guide  Result Date: 02/18/2018 INDICATION: Recurrent symptomatic right sided pleural effusion. Please perform ultrasound-guided thoracentesis for therapeutic purposes. EXAM: US THORACENTESIS ASP PLEURAL SPACE W/IMG GUIDE COMPARISON:  Chest radiograph-earlier same day; ultrasound-guided thoracentesis-02/15/2018; 02/09/2018 MEDICATIONS: None. COMPLICATIONS: None immediate. TECHNIQUE: Informed written consent was obtained from the patient after a discussion of the risks, benefits and alternatives to treatment. A timeout was performed prior to the initiation of the procedure. Initial ultrasound scanning demonstrates a large anechoic right-sided pleural effusion. The lower chest was prepped and draped in the usual sterile fashion. 1% lidocaine was used for local anesthesia. An ultrasound image was saved for documentation purposes. An 8 Fr Safe-T-Centesis catheter was introduced. The thoracentesis was performed. The catheter was removed and a dressing was applied. The patient  tolerated the procedure well without immediate post procedural complication. The patient was escorted to have an upright chest radiograph. FINDINGS: A total of approximately 1.4 liters of serous fluid was removed. IMPRESSION: Successful ultrasound-guided right sided thoracentesis yielding 1.4 liters of pleural fluid. Electronically Signed   By: Sandi Mariscal M.D.   On: 02/18/2018 11:57     Management plans discussed with the patient, family and they are in agreement.  CODE STATUS: Full Code   TOTAL TIME TAKING CARE OF THIS PATIENT: 35 minutes.    Berna Spare Arrin Ishler M.D on 02/18/2018 at 1:28 PM  Between 7am to 6pm - Pager - [phone]  After 6pm go to [email or web address] - [employer] [facility]  Office  [phone]  CC: Primary care physician; Kirk Ruths, MD   Note: This dictation was prepared with Dragon dictation along with smaller phrase technology. Any transcriptional errors that result from this process are unintentional.

## 2018-02-19 LAB — BODY FLUID CULTURE: Culture: NO GROWTH

## 2018-02-22 ENCOUNTER — Other Ambulatory Visit: Payer: Self-pay | Admitting: Psychiatry

## 2018-02-22 DIAGNOSIS — F330 Major depressive disorder, recurrent, mild: Secondary | ICD-10-CM

## 2018-03-08 ENCOUNTER — Ambulatory Visit (INDEPENDENT_AMBULATORY_CARE_PROVIDER_SITE_OTHER): Payer: Medicare Other | Admitting: Gastroenterology

## 2018-03-08 ENCOUNTER — Other Ambulatory Visit: Payer: Self-pay

## 2018-03-08 ENCOUNTER — Encounter: Payer: Self-pay | Admitting: Gastroenterology

## 2018-03-08 VITALS — BP 136/73 | HR 94 | Resp 17 | Wt 125.6 lb

## 2018-03-08 DIAGNOSIS — Z23 Encounter for immunization: Secondary | ICD-10-CM | POA: Diagnosis not present

## 2018-03-08 DIAGNOSIS — K7581 Nonalcoholic steatohepatitis (NASH): Secondary | ICD-10-CM

## 2018-03-08 DIAGNOSIS — K7460 Unspecified cirrhosis of liver: Secondary | ICD-10-CM

## 2018-03-08 DIAGNOSIS — J948 Other specified pleural conditions: Secondary | ICD-10-CM

## 2018-03-08 DIAGNOSIS — J90 Pleural effusion, not elsewhere classified: Secondary | ICD-10-CM | POA: Diagnosis not present

## 2018-03-08 DIAGNOSIS — K769 Liver disease, unspecified: Secondary | ICD-10-CM

## 2018-03-08 NOTE — Progress Notes (Signed)
Cephas Darby, MD [address]  [place]  [place], [place] [address]  Main: [phone]  Fax: [phone]    Gastroenterology Consultation  Referring Provider:     Kirk Ruths, MD Primary Care Physician:  Kirk Ruths, MD Primary Gastroenterologist:  Dr. Cephas Darby Reason for Consultation:     Hepatic hydrothorax, decompensated cirrhosis        HPI:   Cassidy [name] is a 77 y.o. female referred by Dr. Ouida Sills, Ocie Cornfield, MD  for consultation & management of hepatic hydrothorax.  Patient has history of obesity, fatty liver, hypothyroidism with chronic liver disease, splenomegaly, chronic thrombocytopenia and portal hypertension admitted to [facility] secondary to recurrent right-sided large pleural effusions, therapeutic thoracentesis was performed and pleural fluid analysis consistent with hepatic hydrothora from decompensated cirrhosis.  Patient was discharged home on furosemide 40 mg daily along with spironolactone 100 mg daily. Patient was evaluated by pulmonology.  Patient is not a good historian, most of the history is provided by her sister who is her healthcare power of attorney. Apparently, patient's health has been declining for the last 2 years and not able to perform most of her ADLs.  She is dependent on her grandson who lives with her.  She denies fever, cough,.  She does have mild bilateral swelling of legs.  She did lose significant weight over the last 2 years attributed to poor health and poor functional status.  On chart review it appears that patient has chronic thrombocytopenia dating back to 2015 associated with recent onset of anemia.  She was given a diagnosis of cirrhosis of liver back then.  Based on ultrasound in 10/2010, it appears that she has splenomegaly.  She has been followed by oncologist Dr. Tasia Catchings for pancytopenia.  Recent cross-sectional imaging revealed splenomegaly with portal hypertension.  Viral hepatitis panel was negative.  She was  referred to [facility] GI, Dr. Vira Agar for further management.  Patient has been started on rifaximin for hepatic encephalopathy by her primary care physician. Of note, patient was admitted on 02/04/1989 secondary to vaginal bleeding in the setting of severe thrombocytopenia  Interval summary Since hospital discharge, patient has been doing well.  Denies shortness of breath, swelling of legs.  She had follow-up with her PCP, chest x-ray post discharge on 02/25/2018 was reportedly normal with no recurrence of pleural effusion.  Kidney function and electrolytes are normal. She has been adhering to diuretics.  Her sister reports that patient is not herself, has episodes of confusion.  She is taking rifaximin 550 mg twice daily.  She reports that she has 1 bowel movement daily.  She is not taking lactulose.  NSAIDs: None  Antiplts/Anticoagulants/Anti thrombotics: None  GI Procedures: She had several EGDs and colonoscopies in the past.  No known varices or portal hypertensive gastropathy Biopsies negative for H. pylori infection EGD 12/20/2014 - Moderate Schatzki ring. Dilated. - Erythematous mucosa in the antrum. Biopsied. - Normal examined duodenum. DIAGNOSIS:  A. STOMACH; COLD BIOPSY:  - ANTRAL MUCOSA WITH FEATURES OF REACTIVE GASTROPATHY, SEE NOTE.  - OXYNTIC MUCOSA WITH FOCAL MINIMAL CHRONIC GASTRITIS.  - NEGATIVE FOR H. PYLORI, DYSPLASIA, AND MALIGNANCY  Past Medical History:  Diagnosis Date  . Anxiety   . Depression   . Fatty liver   . GERD (gastroesophageal reflux disease)   . Hypothyroidism   . Obesity   . Thyroid disease     Past Surgical History:  Procedure Laterality Date  . APPENDECTOMY    .  DILATATION & CURETTAGE/HYSTEROSCOPY WITH MYOSURE N/A 02/04/2018   Procedure: DILATATION & CURETTAGE/HYSTEROSCOPY WITH MYOSURE;  Surgeon: Ward, Honor Loh, MD;  Location: [facility] ORS;  Service: Gynecology;  Laterality: N/A;  . ESOPHAGOGASTRODUODENOSCOPY (EGD) WITH PROPOFOL N/A  12/20/2014   Procedure: ESOPHAGOGASTRODUODENOSCOPY (EGD) WITH PROPOFOL;  Surgeon: Manya Silvas, MD;  Location: [facility] ENDOSCOPY;  Service: Endoscopy;  Laterality: N/A;  . SAVORY DILATION N/A 12/20/2014   Procedure: SAVORY DILATION;  Surgeon: Manya Silvas, MD;  Location: [facility] ENDOSCOPY;  Service: Endoscopy;  Laterality: N/A;  . TONSILLECTOMY       Current Outpatient Medications:  .  acidophilus (RISAQUAD) CAPS capsule, Take 1 capsule by mouth 2 (two) times daily., Disp: , Rfl:  .  busPIRone (BUSPAR) 15 MG tablet, Take 1 tablet (15 mg total) by mouth at bedtime., Disp: 90 tablet, Rfl: 1 .  clonazePAM (KLONOPIN) 0.5 MG tablet, Take 1 tablet (0.5 mg total) by mouth daily as needed for anxiety. Only for severe anxiety sx, Disp: 20 tablet, Rfl: 1 .  Copper Gluconate 2 MG TABS, Take 1 tablet (2 mg total) by mouth daily., Disp: 30 tablet, Rfl: 0 .  escitalopram (LEXAPRO) 10 MG tablet, Take 1 tablet (10 mg total) by mouth daily., Disp: 90 tablet, Rfl: 1 .  furosemide (LASIX) 40 MG tablet, Take 1 tablet (40 mg total) by mouth daily., Disp: 30 tablet, Rfl: 0 .  levothyroxine (SYNTHROID, LEVOTHROID) 100 MCG tablet, Take 1 tablet (100 mcg total) by mouth daily before breakfast., Disp: 30 tablet, Rfl: 0 .  loperamide (IMODIUM) 2 MG capsule, Take 2 mg by mouth as needed for diarrhea or loose stools., Disp: , Rfl:  .  omeprazole (PRILOSEC) 40 MG capsule, Take 1 capsule (40 mg total) by mouth daily., Disp: 30 capsule, Rfl: 1 .  ondansetron (ZOFRAN-ODT) 8 MG disintegrating tablet, Take 8 mg by mouth every 8 (eight) hours as needed for nausea or vomiting., Disp: , Rfl:  .  oxyCODONE (ROXICODONE) 5 MG immediate release tablet, Take 1 tablet (5 mg total) by mouth every 6 (six) hours as needed for severe pain., Disp: 15 tablet, Rfl: 0 .  QUEtiapine (SEROQUEL) 100 MG tablet, Take 1 tablet (100 mg total) by mouth at bedtime., Disp: 90 tablet, Rfl: 1 .  spironolactone (ALDACTONE) 100 MG tablet, Take 1 tablet (100 mg  total) by mouth daily., Disp: 30 tablet, Rfl: 0 .  traZODone (DESYREL) 50 MG tablet, Take 1 tablet (50 mg total) by mouth at bedtime as needed for sleep. To be combined with 150 mg as needed, Disp: 90 tablet, Rfl: 1 .  vitamin C (VITAMIN C) 250 MG tablet, Take 1 tablet (250 mg total) by mouth 2 (two) times daily., Disp: 30 tablet, Rfl: 0 .  XIFAXAN 550 MG TABS tablet, Take 1 tablet by mouth 2 (two) times daily., Disp: , Rfl:  .  megestrol (MEGACE) 400 MG/10ML suspension, Take 7.5 mLs (300 mg total) by mouth 2 (two) times daily. (Patient not taking: Reported on 03/08/2018), Disp: 240 mL, Rfl: 0 .  Melatonin 1 MG TABS, Take 5 mg by mouth at bedtime as needed (for sleep). , Disp: , Rfl:  .  potassium chloride (K-DUR,KLOR-CON) 10 MEQ tablet, , Disp: , Rfl:  .  traZODone (DESYREL) 100 MG tablet, TAKE 1 AND 1/2 TABLET AT BEDTIME (Patient not taking: Reported on 03/08/2018), Disp: 45 tablet, Rfl: 4 .  traZODone (DESYREL) 150 MG tablet, Take 1 tablet (150 mg total) by mouth at bedtime. (Patient not taking: Reported on 03/08/2018),  Disp: 90 tablet, Rfl: 1  Family History  Problem Relation Age of Onset  . Diabetes Mother   . COPD Mother   . Kidney disease Mother   . Depression Mother   . Hypertension Mother   . Heart attack Father   . Aneurysm Father   . Anxiety disorder Sister   . Depression Sister   . Diabetes Sister   . Hypertension Sister   . Atrial fibrillation Brother   . Spinal muscular atrophy Brother   . Breast cancer Sister   . Bone cancer Sister   . Depression Sister   . Anxiety disorder Sister   . Liver cancer Sister   . Breast cancer Sister   . Colon cancer Sister   . Depression Sister   . Diabetes Sister   . COPD Sister   . Depression Sister   . Anxiety disorder Sister   . Skin cancer Sister   . Diabetes Brother   . Depression Brother   . Skin cancer Maternal Aunt   . Diabetes Maternal Aunt   . Cervical cancer Paternal Aunt      Social History   Tobacco Use  .  Smoking status: Never Smoker  . Smokeless tobacco: Never Used  Substance Use Topics  . Alcohol use: No  . Drug use: No    Allergies as of 03/08/2018 - Review Complete 03/08/2018  Allergen Reaction Noted  . Sulfa antibiotics Shortness Of Breath 11/27/2014    Review of Systems:    All systems reviewed and negative except where noted in HPI.   Physical Exam:  BP 136/73 (BP Location: Left Arm, Patient Position: Sitting, Cuff Size: Normal)   Pulse 94   Resp 17   Wt 125 lb 9.6 oz (57 kg)   LMP  (LMP Unknown)   BMI 22.97 kg/m  No LMP recorded (lmp unknown). Patient is postmenopausal.  General:   Alert, moderately built, moderately nourished, pleasant and cooperative in NAD Head:  Normocephalic and atraumatic. Eyes:  Sclera clear, no icterus.   Conjunctiva pink. Ears:  Normal auditory acuity. Nose:  No deformity, discharge, or lesions. Mouth:  No deformity or lesions,oropharynx pink & moist. Neck:  Supple; no masses or thyromegaly. Lungs:  Respirations even and unlabored.  Clear throughout to auscultation.   No wheezes, crackles, or rhonchi. No acute distress. Heart:  Regular rate and rhythm; no murmurs, clicks, rubs, or gallops. Abdomen:  Normal bowel sounds. Soft, non-tender and non-distended without masses, hepatosplenomegaly or hernias noted.  No guarding or rebound tenderness.   Rectal: Not performed Msk:  Symmetrical without gross deformities. Good, equal movement & strength bilaterally. Pulses:  Normal pulses noted. Extremities:  No clubbing or edema.  No cyanosis. Neurologic:  Alert and oriented x2;  grossly normal neurologically. Skin:  Intact without significant lesions or rashes. No jaundice. Lymph Nodes:  No significant cervical adenopathy. Psych:  Alert and cooperative. Normal mood and affect.  Imaging Studies: Reviewed  Assessment and Plan:   SAIMA MONTERROSO is a 77 y.o. Caucasian female with obesity, fatty liver, hypothyroidism with chronic liver disease,  splenomegaly, chronic thrombocytopenia and portal hypertension with recurrent right-sided pleural effusions, status post therapeutic thoracentesis in 01/2018.  Fluid analysis consistent with hepatic hydrothorax, serum-to-pleural fluid albumin gradient >1.1  Chronic liver disease with portal hypertension: There is no evidence of nodularity of the liver based on imaging Probably secondary to NASH given history of obesity and hypothyroidism, fatty liver Viral hepatitis panel negative for HCV and hepatitis B, administer Twinrix vaccine  today   Portal hypertension:  There is no portal vein thrombosis.  She does have nonocclusive thrombus in the splenic vein extending from the portal splenic confluence Manifested as thrombocytopenia, bilateral swelling of legs, hepatic hydrothorax, hepatic encephalopathy Continue furosemide 40 mg and spironolactone 100 mg daily, low-sodium diet Monitor electrolytes closely Echocardiogram reveals EF of 60 to 25%, systolic function normal, no evidence of pulmonary hypertension  Varices: Recent EGD in 2016 did not reveal varices or portal hypertensive gastropathy No evidence of active GI bleed.    Will discuss with patient about repeat EGD for variceal screening given recent decompensation during next visit PSE: Continue rifaximin, start MiraLAX half a cup to 1 cup daily to have at least 2 soft bowel movements per day HCC screening: No evidence of liver lesions based on the CT scan   Follow up in 4 to 6 weeks   Cephas Darby, MD

## 2018-03-24 ENCOUNTER — Ambulatory Visit (INDEPENDENT_AMBULATORY_CARE_PROVIDER_SITE_OTHER): Payer: Medicare Other | Admitting: Psychiatry

## 2018-03-24 ENCOUNTER — Encounter: Payer: Self-pay | Admitting: Psychiatry

## 2018-03-24 VITALS — BP 138/81 | HR 95 | Ht 62.0 in | Wt 123.0 lb

## 2018-03-24 DIAGNOSIS — F5105 Insomnia due to other mental disorder: Secondary | ICD-10-CM

## 2018-03-24 DIAGNOSIS — F411 Generalized anxiety disorder: Secondary | ICD-10-CM

## 2018-03-24 DIAGNOSIS — F330 Major depressive disorder, recurrent, mild: Secondary | ICD-10-CM

## 2018-03-24 MED ORDER — ESCITALOPRAM OXALATE 10 MG PO TABS: 15.0000 mg | ORAL_TABLET | Freq: Every day | ORAL | 1 refills | Status: DC

## 2018-03-24 MED ORDER — CLONAZEPAM 0.5 MG PO TABS: 0.5000 mg | ORAL_TABLET | Freq: Every day | ORAL | 1 refills | Status: DC | PRN

## 2018-03-24 NOTE — Progress Notes (Signed)
[place] MD OP Progress Note  03/24/2018 5:23 PM Cassidy [name]  MRN:  [id number]  Chief Complaint: ' I am here for follow up.' Chief Complaint    Follow-up     HPI: Cassidy [name] is a 77 year old Caucasian female who has a history of major depressive disorder, GAD, panic attacks, insomnia, thrombocytopenia, hypothyroidism hepatic encephalopathy, presented to the clinic today for a follow-up visit.  Patient lives in [place] and has support system from her sister [name] and her grandson '[name]'.  Patient today presented in a wheelchair.  She appears to be alert and oriented.  She was able to tell me the time, as well as was able to answer questions appropriately even though in short phrases.  Her sister provided collateral information.  Patient continues to have fatigue and lethargy due to her multiple medical problems.  Patient also has some sadness, mood lability and so on.  Per sister [name], patient at times appears to be confused and has episodes of agitation on and off.  Patient continues to sleep well at night.  She is tolerating the medications as prescribed.  Patient denies any perceptual disturbances.  Patient denies any homicidality.  Patient denies any suicidality.  Visit Diagnosis:    ICD-10-CM   1. MDD (major depressive disorder), recurrent episode, mild ([place]) F33.0   2. GAD (generalized anxiety disorder) F41.1   3. Insomnia due to mental disorder F51.05     Past Psychiatric History: Reviewed past psychiatric history from my progress note on 06/23/2017  Past Medical History:  Past Medical History:  Diagnosis Date  . Anxiety   . Depression   . Fatty liver   . GERD (gastroesophageal reflux disease)   . Hypothyroidism   . Obesity   . Thyroid disease     Past Surgical History:  Procedure Laterality Date  . APPENDECTOMY    . DILATATION & CURETTAGE/HYSTEROSCOPY WITH MYOSURE N/A 02/04/2018   Procedure: DILATATION & CURETTAGE/HYSTEROSCOPY WITH MYOSURE;  Surgeon: Ward, Honor Loh, MD;   Location: [facility] ORS;  Service: Gynecology;  Laterality: N/A;  . ESOPHAGOGASTRODUODENOSCOPY (EGD) WITH PROPOFOL N/A 12/20/2014   Procedure: ESOPHAGOGASTRODUODENOSCOPY (EGD) WITH PROPOFOL;  Surgeon: Manya Silvas, MD;  Location: [facility] ENDOSCOPY;  Service: Endoscopy;  Laterality: N/A;  . SAVORY DILATION N/A 12/20/2014   Procedure: SAVORY DILATION;  Surgeon: Manya Silvas, MD;  Location: [facility] ENDOSCOPY;  Service: Endoscopy;  Laterality: N/A;  . TONSILLECTOMY      Family Psychiatric History: Reviewed family psychiatric history from my progress note on 06/23/2017  Family History:  Family History  Problem Relation Age of Onset  . Diabetes Mother   . COPD Mother   . Kidney disease Mother   . Depression Mother   . Hypertension Mother   . Heart attack Father   . Aneurysm Father   . Anxiety disorder Sister   . Depression Sister   . Diabetes Sister   . Hypertension Sister   . Atrial fibrillation Brother   . Spinal muscular atrophy Brother   . Breast cancer Sister   . Bone cancer Sister   . Depression Sister   . Anxiety disorder Sister   . Liver cancer Sister   . Breast cancer Sister   . Colon cancer Sister   . Depression Sister   . Diabetes Sister   . COPD Sister   . Depression Sister   . Anxiety disorder Sister   . Skin cancer Sister   . Diabetes Brother   . Depression Brother   . Skin cancer  Maternal Aunt   . Diabetes Maternal Aunt   . Cervical cancer Paternal Aunt     Social History: Reviewed social history from my progress note on 06/23/2017 Social History   Socioeconomic History  . Marital status: Widowed    Spouse name: Not on file  . Number of children: 3  . Years of education: Not on file  . Highest education level: Not on file  Occupational History  . Not on file  Social Needs  . Financial resource strain: Not on file  . Food insecurity:    Worry: Not on file    Inability: Not on file  . Transportation needs:    Medical: Not on file    Non-medical: Not on  file  Tobacco Use  . Smoking status: Never Smoker  . Smokeless tobacco: Never Used  Substance and Sexual Activity  . Alcohol use: No  . Drug use: No  . Sexual activity: Not Currently    Birth control/protection: Post-menopausal  Lifestyle  . Physical activity:    Days per week: Not on file    Minutes per session: Not on file  . Stress: Not on file  Relationships  . Social connections:    Talks on phone: Not on file    Gets together: Not on file    Attends religious service: Not on file    Active member of club or organization: Not on file    Attends meetings of clubs or organizations: Not on file    Relationship status: Not on file  Other Topics Concern  . Not on file  Social History Narrative  . Not on file    Allergies:  Allergies  Allergen Reactions  . Sulfa Antibiotics Shortness Of Breath    Metabolic Disorder Labs: Lab Results  Component Value Date   HGBA1C 5.3 06/21/2016   MPG 105 06/21/2016   No results found for: PROLACTIN Lab Results  Component Value Date   CHOL 145 06/21/2016   TRIG 51 06/21/2016   HDL 44 06/21/2016   CHOLHDL 3.3 06/21/2016   VLDL 10 06/21/2016   LDLCALC 91 06/21/2016   LDLCALC 70 08/22/2013   Lab Results  Component Value Date   TSH 1.545 02/18/2018   TSH 1.683 12/18/2017    Therapeutic Level Labs: No results found for: LITHIUM No results found for: VALPROATE No components found for:  CBMZ  Current Medications: Current Outpatient Medications  Medication Sig Dispense Refill  . acidophilus (RISAQUAD) CAPS capsule Take 1 capsule by mouth 2 (two) times daily.    . busPIRone (BUSPAR) 15 MG tablet Take 1 tablet (15 mg total) by mouth at bedtime. 90 tablet 1  . clonazePAM (KLONOPIN) 0.5 MG tablet Take 1 tablet (0.5 mg total) by mouth daily as needed for anxiety. Only for severe anxiety sx 20 tablet 1  . Copper Gluconate 2 MG TABS Take 1 tablet (2 mg total) by mouth daily. 30 tablet 0  . furosemide (LASIX) 40 MG tablet Take 1  tablet (40 mg total) by mouth daily. 30 tablet 0  . levothyroxine (SYNTHROID, LEVOTHROID) 100 MCG tablet Take 1 tablet (100 mcg total) by mouth daily before breakfast. 30 tablet 0  . loperamide (IMODIUM) 2 MG capsule Take 2 mg by mouth as needed for diarrhea or loose stools.    . megestrol (MEGACE) 400 MG/10ML suspension Take 7.5 mLs (300 mg total) by mouth 2 (two) times daily. 240 mL 0  . Melatonin 1 MG TABS Take 5 mg by mouth at bedtime  as needed (for sleep).     Marland Kitchen omeprazole (PRILOSEC) 40 MG capsule Take 1 capsule (40 mg total) by mouth daily. 30 capsule 1  . ondansetron (ZOFRAN-ODT) 8 MG disintegrating tablet Take 8 mg by mouth every 8 (eight) hours as needed for nausea or vomiting.    Marland Kitchen oxyCODONE (ROXICODONE) 5 MG immediate release tablet Take 1 tablet (5 mg total) by mouth every 6 (six) hours as needed for severe pain. 15 tablet 0  . potassium chloride (K-DUR,KLOR-CON) 10 MEQ tablet     . QUEtiapine (SEROQUEL) 100 MG tablet Take 1 tablet (100 mg total) by mouth at bedtime. 90 tablet 1  . spironolactone (ALDACTONE) 100 MG tablet Take 1 tablet (100 mg total) by mouth daily. 30 tablet 0  . traZODone (DESYREL) 150 MG tablet Take 1 tablet (150 mg total) by mouth at bedtime. 90 tablet 1  . traZODone (DESYREL) 50 MG tablet Take 1 tablet (50 mg total) by mouth at bedtime as needed for sleep. To be combined with 150 mg as needed 90 tablet 1  . vitamin C (VITAMIN C) 250 MG tablet Take 1 tablet (250 mg total) by mouth 2 (two) times daily. 30 tablet 0  . XIFAXAN 550 MG TABS tablet Take 1 tablet by mouth 2 (two) times daily.    Marland Kitchen escitalopram (LEXAPRO) 10 MG tablet Take 1.5 tablets (15 mg total) by mouth daily. 135 tablet 1  . nitrofurantoin, macrocrystal-monohydrate, (MACROBID) 100 MG capsule      No current facility-administered medications for this visit.      Musculoskeletal: Strength & Muscle Tone: within normal limits Gait & Station: in a wheelchair Patient leans: N/A  Psychiatric Specialty  Exam: Review of Systems  Psychiatric/Behavioral: Positive for depression.  All other systems reviewed and are negative.   Blood pressure 138/81, pulse 95, height 5' 2"  (1.575 m), weight 123 lb (55.8 kg), SpO2 95 %.Body mass index is 22.5 kg/m.  General Appearance: Casual  Eye Contact:  Fair  Speech:  limited  Volume:  Decreased  Mood:  Dysphoric  Affect:  Appropriate  Thought Process:  Linear and Descriptions of Associations: Intact  Orientation:  Other:  person, situation, time  Thought Content: Logical   Suicidal Thoughts:  No  Homicidal Thoughts:  No  Memory:  Immediate;   Fair Recent;   Fair Remote;   Fair  Judgement:  Fair  Insight:  Fair  Psychomotor Activity:  Decreased  Concentration:  Concentration: Fair and Attention Span: Fair  Recall:  [employer] of Knowledge: Fair  Language: Fair  Akathisia:  No  Handed:  Right  AIMS (if indicated): denies tremors, rigidity,stiffness  Assets:  Communication Skills Desire for Improvement Social Support  ADL's:  Intact  Cognition: Impaired,  Mild  Sleep:  Fair   Screenings: AUDIT     Admission (Discharged) from 06/20/2016 in [place]  Alcohol Use Disorder Identification Test Final Score (AUDIT)  0    ECT-MADRS     ECT Treatment from 07/11/2016 in [place] Admission (Discharged) from 06/20/2016 in [place] Total Score  16  16    Mini-Mental     ECT Treatment from 07/11/2016 in [place] Admission (Discharged) from 06/20/2016 in [place]  Total Score (max 30 points )  30  28       Assessment and Plan: [place] is a 77 year old Caucasian female who has a history of depression,  anxiety, insomnia, recent thrombocytopenia, hepatic encephalopathy, hypothyroidism, presented to the clinic today for a follow-up visit.  Patient is currently wheelchair-bound however continues to have good  social support system.  Patient continues to have mood symptoms as well as some confusion and memory problems on and off likely secondary to her recent diagnosis of hepatic encephalopathy, thrombocytopenia.  We will continue to make medication changes.  Plan as noted below.  Plan Depression Increase Lexapro to 15 mg p.o. daily Continue Seroquel 100 mg p.o. nightly  For anxiety BuSpar 15 mg p.o. daily. Klonopin 0.5 mg as needed.  She has been limiting use and uses it only once per week or so.  For insomnia Trazodone 150 mg p.o. nightly Trazodone 50 mg p.o. nightly as needed  She continues to have good social support system from her family.  Follow-up in clinic in 2 months or sooner if needed.  More than 50 % of the time was spent for psychoeducation and supportive psychotherapy and care coordination.  This note was generated in part or whole with voice recognition software. Voice recognition is usually quite accurate but there are transcription errors that can and very often do occur. I apologize for any typographical errors that were not detected and corrected.         Ursula Alert, MD 03/24/2018, 5:23 PM

## 2018-03-25 ENCOUNTER — Ambulatory Visit: Payer: Medicare Other | Admitting: Psychiatry

## 2018-04-08 ENCOUNTER — Inpatient Hospital Stay: Payer: Medicare Other | Admitting: Oncology

## 2018-04-08 ENCOUNTER — Inpatient Hospital Stay: Payer: Medicare Other

## 2018-05-06 ENCOUNTER — Ambulatory Visit (INDEPENDENT_AMBULATORY_CARE_PROVIDER_SITE_OTHER): Payer: Medicare Other | Admitting: Gastroenterology

## 2018-05-06 ENCOUNTER — Encounter: Payer: Self-pay | Admitting: Gastroenterology

## 2018-05-06 VITALS — BP 117/71 | HR 101 | Resp 17 | Wt 129.0 lb

## 2018-05-06 DIAGNOSIS — K7460 Unspecified cirrhosis of liver: Secondary | ICD-10-CM

## 2018-05-06 NOTE — Progress Notes (Addendum)
Cassidy Darby, MD [address]  [place]  [place], [place] [address]  Main: [phone]  Fax: [phone]    Gastroenterology Consultation  Referring Provider:     Kirk Ruths, MD Primary Care Physician:  Kirk Ruths, MD Primary Gastroenterologist:  Dr. Cephas [name] Reason for Consultation:     Hepatic hydrothorax, decompensated cirrhosis        HPI:   Cassidy [name] is a 78 y.o. female referred by Dr. Ouida Sills, Ocie Cornfield, MD  for consultation & management of hepatic hydrothorax.  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  Patient was discharged home on furosemide 40 mg daily along with spironolactone 100 mg daily. Patient was evaluated by pulmonology.  Patient is not a good historian, most of the history is provided by her sister who is her healthcare power of attorney. Apparently, patient's health has been declining for the last 2 years and not able to perform most of her ADLs.  She is dependent on her grandson who lives with her.  She denies fever, cough,.  She does have mild bilateral swelling of legs.  She did lose significant weight over the last 2 years attributed to poor health and poor functional status.  On chart review it appears that patient has chronic thrombocytopenia dating back to 2015 associated with recent onset of anemia.  She was given a diagnosis of cirrhosis of liver back then.  Based on ultrasound in 10/2010, it appears that she has splenomegaly.  She has been followed by oncologist Dr. Tasia Catchings for pancytopenia.  Recent cross-sectional imaging revealed splenomegaly with portal hypertension.  Viral hepatitis panel was negative.  She was  referred to [facility] clinic GI, Dr. Vira Agar for further management.  Patient has been started on rifaximin for hepatic encephalopathy by her primary care physician. Of note, patient was admitted on 02/04/1989 secondary to vaginal bleeding in the setting of severe thrombocytopenia  Interval summary Since hospital discharge, patient has been doing well.  Denies shortness of breath, swelling of legs.  She had follow-up with her PCP, chest x-ray post discharge on 02/25/2018 was reportedly normal with no recurrence of pleural effusion.  Kidney function and electrolytes are normal. She has been adhering to diuretics.  Her sister reports that patient is not herself, has episodes of confusion.  She is taking rifaximin 550 mg twice daily.  She reports that she has 1 bowel movement daily.  She is not taking lactulose.  Follow-up visit 05/06/2018 Patient reports doing fairly well.  She is accompanied by her sister today.  She had some swelling in her left hand, Lasix increased to 40 twice daily by Dr. Ouida Sills, spironolactone 100 mg daily.  Her sister is worried about her mental status that she is not alert and has been forgetful.  She continues to take rifaximin 550 mg twice daily along with MiraLAX once a day.  Currently she is having 1-2 bowel movements daily.  Her appetite is good and she eats well.  She denies shortness of breath, swelling of legs, abdominal distention.  Patient had a Medicare wellness visit done this morning at her house.  She denies any other symptoms.  NSAIDs: None  Antiplts/Anticoagulants/Anti thrombotics: None  GI Procedures: She had several EGDs and colonoscopies in the past.  No  known varices or portal hypertensive gastropathy Biopsies negative for H. pylori infection EGD 12/20/2014 - Moderate Schatzki ring. Dilated. - Erythematous mucosa in the antrum. Biopsied. - Normal examined duodenum. DIAGNOSIS:  A. STOMACH; COLD BIOPSY:  - ANTRAL MUCOSA WITH FEATURES OF REACTIVE  GASTROPATHY, SEE NOTE.  - OXYNTIC MUCOSA WITH FOCAL MINIMAL CHRONIC GASTRITIS.  - NEGATIVE FOR H. PYLORI, DYSPLASIA, AND MALIGNANCY  Past Medical History:  Diagnosis Date  . Anxiety   . Depression   . Fatty liver   . GERD (gastroesophageal reflux disease)   . Hypothyroidism   . Obesity   . Thyroid disease     Past Surgical History:  Procedure Laterality Date  . APPENDECTOMY    . DILATATION & CURETTAGE/HYSTEROSCOPY WITH MYOSURE N/A 02/04/2018   Procedure: DILATATION & CURETTAGE/HYSTEROSCOPY WITH MYOSURE;  Surgeon: Ward, Honor Loh, MD;  Location: [facility] ORS;  Service: Gynecology;  Laterality: N/A;  . ESOPHAGOGASTRODUODENOSCOPY (EGD) WITH PROPOFOL N/A 12/20/2014   Procedure: ESOPHAGOGASTRODUODENOSCOPY (EGD) WITH PROPOFOL;  Surgeon: Manya Silvas, MD;  Location: [facility] ENDOSCOPY;  Service: Endoscopy;  Laterality: N/A;  . SAVORY DILATION N/A 12/20/2014   Procedure: SAVORY DILATION;  Surgeon: Manya Silvas, MD;  Location: [facility] ENDOSCOPY;  Service: Endoscopy;  Laterality: N/A;  . TONSILLECTOMY       Current Outpatient Medications:  .  acidophilus (RISAQUAD) CAPS capsule, Take 1 capsule by mouth 2 (two) times daily., Disp: , Rfl:  .  busPIRone (BUSPAR) 15 MG tablet, Take 1 tablet (15 mg total) by mouth at bedtime., Disp: 90 tablet, Rfl: 1 .  clonazePAM (KLONOPIN) 0.5 MG tablet, Take 1 tablet (0.5 mg total) by mouth daily as needed for anxiety. Only for severe anxiety sx, Disp: 20 tablet, Rfl: 1 .  escitalopram (LEXAPRO) 10 MG tablet, Take 1.5 tablets (15 mg total) by mouth daily., Disp: 135 tablet, Rfl: 1 .  furosemide (LASIX) 40 MG tablet, Take 1 tablet (40 mg total) by mouth daily., Disp: 30 tablet, Rfl: 0 .  levothyroxine (SYNTHROID, LEVOTHROID) 100 MCG tablet, Take 1 tablet (100 mcg total) by mouth daily before breakfast., Disp: 30 tablet, Rfl: 0 .  omeprazole (PRILOSEC) 40 MG capsule, Take 1 capsule (40 mg total) by mouth daily., Disp: 30 capsule, Rfl: 1 .  ondansetron (ZOFRAN-ODT) 8  MG disintegrating tablet, Take 8 mg by mouth every 8 (eight) hours as needed for nausea or vomiting., Disp: , Rfl:  .  oxyCODONE (ROXICODONE) 5 MG immediate release tablet, Take 1 tablet (5 mg total) by mouth every 6 (six) hours as needed for severe pain., Disp: 15 tablet, Rfl: 0 .  QUEtiapine (SEROQUEL) 100 MG tablet, Take 1 tablet (100 mg total) by mouth at bedtime., Disp: 90 tablet, Rfl: 1 .  spironolactone (ALDACTONE) 100 MG tablet, Take 1 tablet (100 mg total) by mouth daily., Disp: 30 tablet, Rfl: 0 .  traZODone (DESYREL) 150 MG tablet, Take 1 tablet (150 mg total) by mouth at bedtime., Disp: 90 tablet, Rfl: 1 .  XIFAXAN 550 MG TABS tablet, Take 1 tablet by mouth 2 (two) times daily., Disp: , Rfl:  .  Copper Gluconate 2 MG TABS, Take 1 tablet (2 mg total) by mouth daily. (Patient not taking: Reported on 05/06/2018), Disp: 30 tablet, Rfl: 0 .  loperamide (IMODIUM) 2 MG capsule, Take 2 mg by mouth as needed for diarrhea or loose stools., Disp: , Rfl:  .  megestrol (MEGACE) 400 MG/10ML suspension, Take 7.5 mLs (300 mg total) by mouth 2 (two) times daily. (Patient not taking: Reported  on 05/06/2018), Disp: 240 mL, Rfl: 0 .  Melatonin 1 MG TABS, Take 5 mg by mouth at bedtime as needed (for sleep). , Disp: , Rfl:  .  nitrofurantoin, macrocrystal-monohydrate, (MACROBID) 100 MG capsule, , Disp: , Rfl:  .  potassium chloride (K-DUR,KLOR-CON) 10 MEQ tablet, , Disp: , Rfl:  .  traZODone (DESYREL) 50 MG tablet, Take 1 tablet (50 mg total) by mouth at bedtime as needed for sleep. To be combined with 150 mg as needed (Patient not taking: Reported on 05/06/2018), Disp: 90 tablet, Rfl: 1 .  vitamin C (VITAMIN C) 250 MG tablet, Take 1 tablet (250 mg total) by mouth 2 (two) times daily. (Patient not taking: Reported on 05/06/2018), Disp: 30 tablet, Rfl: 0  Family History  Problem Relation Age of Onset  . Diabetes Mother   . COPD Mother   . Kidney disease Mother   . Depression Mother   . Hypertension Mother   .  Heart attack Father   . Aneurysm Father   . Anxiety disorder Sister   . Depression Sister   . Diabetes Sister   . Hypertension Sister   . Atrial fibrillation Brother   . Spinal muscular atrophy Brother   . Breast cancer Sister   . Bone cancer Sister   . Depression Sister   . Anxiety disorder Sister   . Liver cancer Sister   . Breast cancer Sister   . Colon cancer Sister   . Depression Sister   . Diabetes Sister   . COPD Sister   . Depression Sister   . Anxiety disorder Sister   . Skin cancer Sister   . Diabetes Brother   . Depression Brother   . Skin cancer Maternal Aunt   . Diabetes Maternal Aunt   . Cervical cancer Paternal Aunt      Social History   Tobacco Use  . Smoking status: Never Smoker  . Smokeless tobacco: Never Used  Substance Use Topics  . Alcohol use: No  . Drug use: No    Allergies as of 05/06/2018 - Review Complete 05/06/2018  Allergen Reaction Noted  . Sulfa antibiotics Shortness Of Breath 11/27/2014    Review of Systems:    All systems reviewed and negative except where noted in HPI.   Physical Exam:  BP 117/71 (BP Location: Left Arm, Patient Position: Sitting, Cuff Size: Normal)   Pulse (!) 101   Resp 17   Wt 129 lb (58.5 kg)   LMP  (LMP Unknown)   BMI 23.59 kg/m  No LMP recorded (lmp unknown). Patient is postmenopausal.  General:   Alert, moderately built, moderately nourished, pleasant and cooperative in NAD Head:  Normocephalic and atraumatic. Eyes:  Sclera clear, no icterus.   Conjunctiva pink. Ears:  Normal auditory acuity. Nose:  No deformity, discharge, or lesions. Mouth:  No deformity or lesions,oropharynx pink & moist. Neck:  Supple; no masses or thyromegaly. Lungs:  Respirations even and unlabored.  Clear throughout to auscultation.   No wheezes, crackles, or rhonchi. No acute distress. Heart:  Regular rate and rhythm; no murmurs, clicks, rubs, or gallops. Abdomen:  Normal bowel sounds. Soft, non-tender and non-distended  without masses, hepatosplenomegaly or hernias noted.  No guarding or rebound tenderness.   Rectal: Not performed Msk:  Symmetrical without gross deformities. Good, equal movement & strength bilaterally. Pulses:  Normal pulses noted. Extremities:  No clubbing or edema.  No cyanosis. Neurologic:  Alert and oriented x2;  grossly normal neurologically. Skin:  Intact without significant  lesions or rashes. No jaundice.  Spontaneous bruises on her bilateral upper extremities Psych:  Alert and cooperative. Normal mood and affect.  Imaging Studies: Reviewed  Assessment and Plan:   CANDY LEVERETT is a 78 y.o. Caucasian female with obesity, fatty liver, hypothyroidism with chronic liver disease, splenomegaly, chronic thrombocytopenia and portal hypertension with recurrent right-sided pleural effusions, status post therapeutic thoracentesis in 01/2018.  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  Defer EGD at this time given her age and overall medical condition  PSE: Continue rifaximin, MiraLAX 1 to 2 cups daily to have at  least 2 soft bowel movements per day HCC screening: No evidence of liver lesions based on the CT scan  Patient has poor functional status, dependent on ADLs.  Therefore, recommend palliative care, referral placed Patient's sister reported that she had conversation with her about end-of-life issues and patient does not want to be hooked to machines.  She said it has not been on the paper yet.  Check CBC, BMP and magnesium today  Follow up in 3 months   Cassidy Darby, MD

## 2018-05-07 LAB — CBC
HEMATOCRIT: 36.9 % (ref 34.0–46.6)
HEMOGLOBIN: 12.3 g/dL (ref 11.1–15.9)
MCH: 29.1 pg (ref 26.6–33.0)
MCHC: 33.3 g/dL (ref 31.5–35.7)
MCV: 87 fL (ref 79–97)
Platelets: 112 10*3/uL — ABNORMAL LOW (ref 150–450)
RBC: 4.23 x10E6/uL (ref 3.77–5.28)
RDW: 15.3 % (ref 11.7–15.4)
WBC: 4.8 10*3/uL (ref 3.4–10.8)

## 2018-05-07 LAB — BASIC METABOLIC PANEL
BUN/Creatinine Ratio: 9 — ABNORMAL LOW (ref 12–28)
BUN: 9 mg/dL (ref 8–27)
CALCIUM: 9.1 mg/dL (ref 8.7–10.3)
CO2: 24 mmol/L (ref 20–29)
CREATININE: 0.98 mg/dL (ref 0.57–1.00)
Chloride: 98 mmol/L (ref 96–106)
GFR calc Af Amer: 64 mL/min/{1.73_m2} (ref >59)
GFR, EST NON AFRICAN AMERICAN: 56 mL/min/{1.73_m2} — AB (ref >59)
GLUCOSE: 93 mg/dL (ref 65–99)
POTASSIUM: 4 mmol/L (ref 3.5–5.2)
SODIUM: 136 mmol/L (ref 134–144)

## 2018-05-07 LAB — MAGNESIUM: MAGNESIUM: 2 mg/dL (ref 1.6–2.3)

## 2018-05-08 DIAGNOSIS — R531 Weakness: Secondary | ICD-10-CM | POA: Insufficient documentation

## 2018-05-08 DIAGNOSIS — R600 Localized edema: Secondary | ICD-10-CM | POA: Insufficient documentation

## 2018-05-11 DIAGNOSIS — K7290 Hepatic failure, unspecified without coma: Secondary | ICD-10-CM | POA: Insufficient documentation

## 2018-05-24 ENCOUNTER — Other Ambulatory Visit: Payer: Self-pay | Admitting: Psychiatry

## 2018-05-24 DIAGNOSIS — F330 Major depressive disorder, recurrent, mild: Secondary | ICD-10-CM

## 2018-05-24 MED ORDER — BUSPIRONE HCL 15 MG PO TABS: 15.0000 mg | ORAL_TABLET | Freq: Every day | ORAL | 1 refills | Status: DC

## 2018-05-24 MED ORDER — TRAZODONE HCL 50 MG PO TABS: 50.0000 mg | ORAL_TABLET | Freq: Every evening | ORAL | 1 refills | Status: DC | PRN

## 2018-05-24 MED ORDER — QUETIAPINE FUMARATE 100 MG PO TABS: 100.0000 mg | ORAL_TABLET | Freq: Every day | ORAL | 1 refills | Status: DC

## 2018-05-24 MED ORDER — TRAZODONE HCL 150 MG PO TABS: 150.0000 mg | ORAL_TABLET | Freq: Every day | ORAL | 1 refills | Status: DC

## 2018-05-24 NOTE — Telephone Encounter (Signed)
Sent refills to pharmacy for seroquel, buspar, trazodone

## 2018-05-25 ENCOUNTER — Ambulatory Visit: Payer: Medicare Other | Admitting: Psychiatry

## 2018-05-26 ENCOUNTER — Encounter: Payer: Self-pay | Admitting: Psychiatry

## 2018-05-26 ENCOUNTER — Ambulatory Visit (INDEPENDENT_AMBULATORY_CARE_PROVIDER_SITE_OTHER): Payer: Medicare Other | Admitting: Psychiatry

## 2018-05-26 ENCOUNTER — Other Ambulatory Visit: Payer: Self-pay

## 2018-05-26 VITALS — BP 117/75 | HR 83 | Temp 98.6°F

## 2018-05-26 DIAGNOSIS — F330 Major depressive disorder, recurrent, mild: Secondary | ICD-10-CM | POA: Diagnosis not present

## 2018-05-26 DIAGNOSIS — F5105 Insomnia due to other mental disorder: Secondary | ICD-10-CM

## 2018-05-26 DIAGNOSIS — F411 Generalized anxiety disorder: Secondary | ICD-10-CM | POA: Diagnosis not present

## 2018-05-26 MED ORDER — ESCITALOPRAM OXALATE 20 MG PO TABS: 20.0000 mg | ORAL_TABLET | Freq: Every day | ORAL | 1 refills | Status: DC

## 2018-05-26 NOTE — Progress Notes (Addendum)
[facility] MD OP Progress Note  05/26/2018 3:36 PM MADDIX KLIEWER  MRN:  [id number]  Chief Complaint: ' I am here for follow up.' Chief Complaint    Follow-up; Medication Refill     HPI: Cassidy [name] is a 78 yr old patient female who has a history of MDD, GAD, panic attacks, insomnia, thrombocytopenia, hypothyroidism, chronic liver disease with portal hypertension, presented to clinic today for a follow-up visit.  Patient currently lives in [place] with her Sister [name] and [name].  Patient today presented along with her sister who provided collateral information.  Patient continues to be wheelchair-bound.  She however appears to be alert and oriented.  She was able to tell me the month, was able to remember what she ate for lunch as well as where she had her lunch at and other personal information.  Per sister patient continues to have some fatigue and lethargy mostly due to her multiple medical problems.  She has good days and bad days.  There are some days she appears to be more depressed than others.  She does sleep better now that she is on a higher dosage of trazodone.  She also takes Seroquel.  Some days she appears to be confused and needs redirection.  Patient does have multiple medical problems including chronic liver disease and portal hypertension .  She continues to follow-up with her primary providers who is managing it.  Discussed readjusting her Lexapro, patient as well as sister agrees with plan.    Visit Diagnosis:    ICD-10-CM   1. MDD (major depressive disorder), recurrent episode, mild (HCC) F33.0 escitalopram (LEXAPRO) 20 MG tablet  2. GAD (generalized anxiety disorder) F41.1 escitalopram (LEXAPRO) 20 MG tablet  3. Insomnia due to mental disorder F51.05     Past Psychiatric History: Have reviewed past psychiatric history from my progress note on 06/23/2017. Past Medical History:  Past Medical History:  Diagnosis Date  . Anxiety   . Depression   . Fatty liver    . GERD (gastroesophageal reflux disease)   . Hypothyroidism   . Obesity   . Thyroid disease     Past Surgical History:  Procedure Laterality Date  . APPENDECTOMY    . DILATATION & CURETTAGE/HYSTEROSCOPY WITH MYOSURE N/A 02/04/2018   Procedure: DILATATION & CURETTAGE/HYSTEROSCOPY WITH MYOSURE;  Surgeon: Ward, Honor Loh, MD;  Location: [facility] ORS;  Service: Gynecology;  Laterality: N/A;  . ESOPHAGOGASTRODUODENOSCOPY (EGD) WITH PROPOFOL N/A 12/20/2014   Procedure: ESOPHAGOGASTRODUODENOSCOPY (EGD) WITH PROPOFOL;  Surgeon: Manya Silvas, MD;  Location: [facility] ENDOSCOPY;  Service: Endoscopy;  Laterality: N/A;  . SAVORY DILATION N/A 12/20/2014   Procedure: SAVORY DILATION;  Surgeon: Manya Silvas, MD;  Location: [facility] ENDOSCOPY;  Service: Endoscopy;  Laterality: N/A;  . TONSILLECTOMY      Family Psychiatric History: Reviewed family psychiatric history from my progress note on 06/23/2017  Family History:  Family History  Problem Relation Age of Onset  . Diabetes Mother   . COPD Mother   . Kidney disease Mother   . Depression Mother   . Hypertension Mother   . Heart attack Father   . Aneurysm Father   . Anxiety disorder Sister   . Depression Sister   . Diabetes Sister   . Hypertension Sister   . Atrial fibrillation Brother   . Spinal muscular atrophy Brother   . Breast cancer Sister   . Bone cancer Sister   . Depression Sister   . Anxiety disorder Sister   . Liver  cancer Sister   . Breast cancer Sister   . Colon cancer Sister   . Depression Sister   . Diabetes Sister   . COPD Sister   . Depression Sister   . Anxiety disorder Sister   . Skin cancer Sister   . Diabetes Brother   . Depression Brother   . Skin cancer Maternal Aunt   . Diabetes Maternal Aunt   . Cervical cancer Paternal Aunt     Social History: Reviewed social history from my progress note on 06/23/2017 Social History   Socioeconomic History  . Marital status: Widowed    Spouse name: Not on file  . Number  of children: 3  . Years of education: Not on file  . Highest education level: Not on file  Occupational History  . Not on file  Social Needs  . Financial resource strain: Not on file  . Food insecurity:    Worry: Not on file    Inability: Not on file  . Transportation needs:    Medical: Not on file    Non-medical: Not on file  Tobacco Use  . Smoking status: Never Smoker  . Smokeless tobacco: Never Used  Substance and Sexual Activity  . Alcohol use: No  . Drug use: No  . Sexual activity: Not Currently    Birth control/protection: Post-menopausal  Lifestyle  . Physical activity:    Days per week: Not on file    Minutes per session: Not on file  . Stress: Not on file  Relationships  . Social connections:    Talks on phone: Not on file    Gets together: Not on file    Attends religious service: Not on file    Active member of club or organization: Not on file    Attends meetings of clubs or organizations: Not on file    Relationship status: Not on file  Other Topics Concern  . Not on file  Social History Narrative  . Not on file    Allergies:  Allergies  Allergen Reactions  . Sulfa Antibiotics Shortness Of Breath    Metabolic Disorder Labs: Lab Results  Component Value Date   HGBA1C 5.3 06/21/2016   MPG 105 06/21/2016   No results found for: PROLACTIN Lab Results  Component Value Date   CHOL 145 06/21/2016   TRIG 51 06/21/2016   HDL 44 06/21/2016   CHOLHDL 3.3 06/21/2016   VLDL 10 06/21/2016   LDLCALC 91 06/21/2016   LDLCALC 70 08/22/2013   Lab Results  Component Value Date   TSH 1.545 02/18/2018   TSH 1.683 12/18/2017    Therapeutic Level Labs: No results found for: LITHIUM No results found for: VALPROATE No components found for:  CBMZ  Current Medications: Current Outpatient Medications  Medication Sig Dispense Refill  . acidophilus (RISAQUAD) CAPS capsule Take 1 capsule by mouth 2 (two) times daily.    . busPIRone (BUSPAR) 15 MG tablet Take  1 tablet (15 mg total) by mouth at bedtime. 90 tablet 1  . clonazePAM (KLONOPIN) 0.5 MG tablet Take 1 tablet (0.5 mg total) by mouth daily as needed for anxiety. Only for severe anxiety sx 20 tablet 1  . Copper Gluconate 2 MG TABS Take 1 tablet (2 mg total) by mouth daily. 30 tablet 0  . furosemide (LASIX) 40 MG tablet Take 1 tablet (40 mg total) by mouth daily. 30 tablet 0  . levothyroxine (SYNTHROID, LEVOTHROID) 100 MCG tablet Take 1 tablet (100 mcg total) by mouth daily  before breakfast. 30 tablet 0  . loperamide (IMODIUM) 2 MG capsule Take 2 mg by mouth as needed for diarrhea or loose stools.    . megestrol (MEGACE) 400 MG/10ML suspension Take 7.5 mLs (300 mg total) by mouth 2 (two) times daily. 240 mL 0  . Melatonin 1 MG TABS Take 5 mg by mouth at bedtime as needed (for sleep).     . nitrofurantoin, macrocrystal-monohydrate, (MACROBID) 100 MG capsule     . omeprazole (PRILOSEC) 40 MG capsule Take 1 capsule (40 mg total) by mouth daily. 30 capsule 1  . ondansetron (ZOFRAN-ODT) 8 MG disintegrating tablet Take 8 mg by mouth every 8 (eight) hours as needed for nausea or vomiting.    Marland Kitchen oxyCODONE (ROXICODONE) 5 MG immediate release tablet Take 1 tablet (5 mg total) by mouth every 6 (six) hours as needed for severe pain. 15 tablet 0  . potassium chloride (K-DUR,KLOR-CON) 10 MEQ tablet     . QUEtiapine (SEROQUEL) 100 MG tablet Take 1 tablet (100 mg total) by mouth at bedtime. 90 tablet 1  . spironolactone (ALDACTONE) 100 MG tablet Take 1 tablet (100 mg total) by mouth daily. 30 tablet 0  . traZODone (DESYREL) 150 MG tablet Take 1 tablet (150 mg total) by mouth at bedtime. 90 tablet 1  . traZODone (DESYREL) 50 MG tablet Take 1 tablet (50 mg total) by mouth at bedtime as needed for sleep. To be combined with 150 mg as needed 90 tablet 1  . vitamin C (VITAMIN C) 250 MG tablet Take 1 tablet (250 mg total) by mouth 2 (two) times daily. 30 tablet 0  . XIFAXAN 550 MG TABS tablet Take 1 tablet by mouth 2  (two) times daily.    Marland Kitchen escitalopram (LEXAPRO) 20 MG tablet Take 1 tablet (20 mg total) by mouth daily. 90 tablet 1   No current facility-administered medications for this visit.      Musculoskeletal: Strength & Muscle Tone: within normal limits Gait & Station: wheelchair bound Patient leans: N/A  Psychiatric Specialty Exam: Review of Systems  Psychiatric/Behavioral: Positive for depression.  All other systems reviewed and are negative.   Blood pressure 117/75, pulse 83, temperature 98.6 F (37 C), temperature source Oral.There is no height or weight on file to calculate BMI.  General Appearance: Casual  Eye Contact:  Fair  Speech:  Clear and Coherent  Volume:  Normal  Mood:  Dysphoric  Affect:  Congruent  Thought Process:  Goal Directed and Descriptions of Associations: Intact  Orientation:  Full (Time, Place, and Person)  Thought Content: Logical   Suicidal Thoughts:  No  Homicidal Thoughts:  No  Memory:  Immediate;   Fair Recent;   limited Remote;   limited  Judgement:  Fair  Insight:  Fair  Psychomotor Activity:  Decreased  Concentration:  Concentration: Fair and Attention Span: Fair  Recall:  [employer] of Knowledge: Fair  Language: Fair  Akathisia:  No  Handed:  Right  AIMS (if indicated): pt has mild tremors of BL hands   Assets:  Communication Skills Desire for Improvement Social Support  ADL's:  Impaired  Cognition: WNL  Sleep:  improving   Screenings: AUDIT     Admission (Discharged) from 06/20/2016 in [facility]  Alcohol Use Disorder Identification Test Final Score (AUDIT)  0    ECT-MADRS     ECT Treatment from 07/11/2016 in [facility] Admission (Discharged) from 06/20/2016 in [facility]  Total Score  16  16    Mini-Mental     ECT Treatment from 07/11/2016 in [facility] Admission (Discharged) from 06/20/2016 in [facility]  Total Score (max 30 points )  30  28       Assessment and Plan:Cassidy [name] is a 57 old Caucasian female who has a history of depression, anxiety, insomnia, recent thrombocytopenia, chronic liver disease with portal hypertension, hypothyroidism, presented to the clinic today for a follow-up visit.  Patient is currently wheelchair-bound.  Patient does have good social support system.  Patient continues to have some depressive symptoms.  Hence we will continue to make medication readjustment.  Plan Depression-unstable Increase Lexapro to 20 mg p.o. daily.   Seroquel 100 mg p.o. nightly  For anxiety-improving BuSpar 15 mg p.o. daily Klonopin 0.5 mg as needed only for severe anxiety symptoms  For insomnia-improving Trazodone 150 mg p.o. nightly Trazodone 50 mg p.o. nightly as needed.  Collateral information obtained from sister as summarized above.  I have reviewed medical records in [facility] per Dr. Sherri Sear dated 05/06/2018-' and with chronic liver disease with portal hypertension, continue furosemide 40 mg and spironolactone 100 mg p.o. daily, low-sodium diet.'  Follow-up in clinic in 2 months or sooner if needed.  I have spent atleast 15 minutes face to face with patient today. More than 50 % of the time was spent for psychoeducation and supportive psychotherapy and care coordination.  This note was generated in part or whole with voice recognition software. Voice recognition is usually quite accurate but there are transcription errors that can and very often do occur. I apologize for any typographical errors that were not detected and corrected.             Ursula Alert, MD 05/27/2018, 8:36 AM

## 2018-06-22 ENCOUNTER — Other Ambulatory Visit: Payer: Self-pay | Admitting: Psychiatry

## 2018-06-22 DIAGNOSIS — F330 Major depressive disorder, recurrent, mild: Secondary | ICD-10-CM

## 2018-06-29 ENCOUNTER — Other Ambulatory Visit: Payer: Medicare Other | Admitting: Student

## 2018-06-29 DIAGNOSIS — Z515 Encounter for palliative care: Secondary | ICD-10-CM

## 2018-06-29 NOTE — Progress Notes (Signed)
[facility] Consult Note Telephone: [phone]  Fax: [phone]  PATIENT NAME: Cassidy [name] DOB: 05/28/1940 MRN: [id number]  PRIMARY CARE PROVIDER:   Kirk Ruths, MD  REFERRING PROVIDER:  Kirk Ruths, MD [address] [place], [place] [address]  RESPONSIBLE PARTY: Sister, [name]  ASSESSMENT: Cassidy [name] is alert and oriented x 3, with some forgetfulness noted. Sister [name] is present. Cassidy [name] does look to [name] to answer some questions, while she does engage in conversation as well. No acute distress noted. We discussed role of Palliative Medicine. We discussed goals of care; goal is for patient to be comfortable and remain in the home. We discussed symptom management. We discussed code status; she would like to remain a Full Code and have CPR attempted. She is open to hospitalizations as needed. Dr. Ouida Sills notified via phone with today's findings.       RECOMMENDATIONS and PLAN:  1. Code status: Full Code. 2. Medical goals of therapy: Sister would like for Cassidy [name] to to be comfortable, remain in the home and out of nursing facility if at all possible.  3. Symptom management: edema-continue furosemide 80m daily, aldactone 1044mdaily; she is encouraged to elevate her legs while in recliner. Dyspnea-continue diuretics as ordered; Dr. AnOuida Sillsotified of worsening dyspnea, past history of recurrent pleural effusions.  4. Discharge Planning: she will remain at home with assistance from her sister LiLattie Haw5. Emotional support: discussed with Ms. PuPanagopoulosnd sister LiLattie Hawthey are encouraged to call with questions.  Dr. AnTonette Bihariffice notified via phone of today's visit; awaiting return call.     I spent 75 minutes providing this consultation,  from 1:40 pm to 2:55pm. More than 50% of the time in this consultation was spent coordinating communication.   HISTORY OF PRESENT ILLNESS:   JoGLANDA SPANBAUERs a [age].o.  female with multiple medical problems including anxiety, depression, NASH,  cirrhosis of liver, GERD, hypothyroidism, chronic thrombocytopenia. Ms. PuDackas hospitalized 10/26 to 10 /31/19 due to pleural effusion, dyspnea, status post thoracentesis. Effusion likely related to portal hypertension. Palliative Care was asked to help address goals of care. Ms. PuFongs currently residing at her sister [name]'s home. She reports not feeling well the past several days, but with non-specific complaints, stating "I just don't feel well." She states her appetite has not been as good, stating "food just doesn't taste right." She had a fall yesterday with no apparent injury. She also reports worsening shortness of breath with exertion and edema to lower extremities. LiLattie Hawas given additional furosemide 4056mhe past two days. She states that last time patient hand edema to hands, she gave additional furosemide x 3 days and the edema improved. She denies pain, dyspnea at rest, nausea, constipation at present. LisLattie Hawsist with all adl's, such as bathing, dressing, grooming. She is able to ambulate to and from bathroom. She has a wheelchair. LisLattie Hawepares meals. LisLattie Hawates Cassidy [name] declined after her daughter passed; history of severe depression and received ECT therapy. Mood has been stable. LisLattie Hawll occasionally take Cassidy [name] out on days she is feeling better. Ms. PurWoznickports sleeping well at night, naps during the day. Ms. PurCawleys not been taking potassium, but is eating bananas.   CODE STATUS: Full Code  PPS: 50% HOSPICE ELIGIBILITY/DIAGNOSIS: TBD  PAST MEDICAL HISTORY:  Past Medical History:  Diagnosis Date  . Anxiety   . Depression   .  Fatty liver   . GERD (gastroesophageal reflux disease)   . Hypothyroidism   . Obesity   . Thyroid disease     SOCIAL HX:  Social History   Tobacco Use  . Smoking status: Never Smoker  . Smokeless tobacco: Never Used  Substance  Use Topics  . Alcohol use: No    ALLERGIES:  Allergies  Allergen Reactions  . Sulfa Antibiotics Shortness Of Breath     PERTINENT MEDICATIONS:  Outpatient Encounter Medications as of 06/29/2018  Medication Sig  . acidophilus (RISAQUAD) CAPS capsule Take 1 capsule by mouth 2 (two) times daily.  . busPIRone (BUSPAR) 15 MG tablet TAKE 1 TABLET BY MOUTH NIGHTLY  . clonazePAM (KLONOPIN) 0.5 MG tablet Take 1 tablet (0.5 mg total) by mouth daily as needed for anxiety. Only for severe anxiety sx  . escitalopram (LEXAPRO) 20 MG tablet Take 1 tablet (20 mg total) by mouth daily.  . furosemide (LASIX) 40 MG tablet Take 1 tablet (40 mg total) by mouth daily.  Marland Kitchen levothyroxine (SYNTHROID, LEVOTHROID) 100 MCG tablet Take 1 tablet (100 mcg total) by mouth daily before breakfast.  . megestrol (MEGACE) 400 MG/10ML suspension Take 7.5 mLs (300 mg total) by mouth 2 (two) times daily.  . ondansetron (ZOFRAN-ODT) 8 MG disintegrating tablet Take 8 mg by mouth every 8 (eight) hours as needed for nausea or vomiting.  Marland Kitchen oxyCODONE (ROXICODONE) 5 MG immediate release tablet Take 1 tablet (5 mg total) by mouth every 6 (six) hours as needed for severe pain.  . potassium chloride (K-DUR,KLOR-CON) 10 MEQ tablet   . QUEtiapine (SEROQUEL) 100 MG tablet Take 1 tablet (100 mg total) by mouth at bedtime.  Marland Kitchen spironolactone (ALDACTONE) 100 MG tablet Take 1 tablet (100 mg total) by mouth daily.  . traZODone (DESYREL) 150 MG tablet Take 1 tablet (150 mg total) by mouth at bedtime.  Marland Kitchen XIFAXAN 550 MG TABS tablet Take 1 tablet by mouth 2 (two) times daily.  . Copper Gluconate 2 MG TABS Take 1 tablet (2 mg total) by mouth daily. (Patient not taking: Reported on 06/29/2018)  . loperamide (IMODIUM) 2 MG capsule Take 2 mg by mouth as needed for diarrhea or loose stools.  . Melatonin 1 MG TABS Take 5 mg by mouth at bedtime as needed (for sleep).   . nitrofurantoin, macrocrystal-monohydrate, (MACROBID) 100 MG capsule   . omeprazole  (PRILOSEC) 40 MG capsule Take 1 capsule (40 mg total) by mouth daily.  . traZODone (DESYREL) 50 MG tablet Take 1 tablet (50 mg total) by mouth at bedtime as needed for sleep. To be combined with 150 mg as needed (Patient not taking: Reported on 06/29/2018)  . vitamin C (VITAMIN C) 250 MG tablet Take 1 tablet (250 mg total) by mouth 2 (two) times daily. (Patient not taking: Reported on 06/29/2018)   No facility-administered encounter medications on file as of 06/29/2018.     PHYSICAL EXAM:   General: NAD, frail appearing Cardiovascular: regular rate and rhythm Pulmonary: upper lobes clear, right base diminished, +crackles Abdomen: soft, nontender, + bowel sounds GU: no suprapubic tenderness Extremities: 1+ non-pitting edema, no joint deformities Skin: no rashes Neurological: Weakness but otherwise nonfocal  Ezekiel Slocumb, NP

## 2018-07-01 ENCOUNTER — Telehealth: Payer: Self-pay | Admitting: Student

## 2018-07-01 NOTE — Telephone Encounter (Signed)
06/30/2018 at 4:09pm. NP called Cassidy [name] to let her know that Cassidy [name] at Dr. Tonette Bihari office left a message stating for patient to take extra furosemide x 2 days. She states Cassidy [name] is doing a little better today and she ate well today. She is encouraged to notify with questions or concerns.

## 2018-07-01 NOTE — Telephone Encounter (Signed)
06/30/2018 at 8:53am: received call from [facility] at Dr. Tonette Bihari office; she states for patient to continue extra furosemide dosage. She is asked for how long he would like for he to take additional dosage. She will call back. She also states Ms. Motter should still be taking her levothyroxine.

## 2018-07-08 ENCOUNTER — Ambulatory Visit: Payer: Self-pay | Admitting: Gastroenterology

## 2018-07-21 ENCOUNTER — Telehealth: Payer: Self-pay

## 2018-07-21 ENCOUNTER — Other Ambulatory Visit: Payer: Self-pay | Admitting: Psychiatry

## 2018-07-21 DIAGNOSIS — F330 Major depressive disorder, recurrent, mild: Secondary | ICD-10-CM

## 2018-07-21 DIAGNOSIS — F411 Generalized anxiety disorder: Secondary | ICD-10-CM

## 2018-07-21 NOTE — Telephone Encounter (Signed)
Tc 07-21-18 spoke with patient sister and told her that per dr. Shea Evans patient is on lexapro 96m .

## 2018-07-21 NOTE — Telephone Encounter (Signed)
Please let her know patient is on Lexapro 20 mg

## 2018-07-21 NOTE — Telephone Encounter (Signed)
the pharmacy called states that the patient daughter states that patient should be taking 102m of lexapro not 233m  states she was on 1080mhen 60m17md then it was suppose to increase again to 40mg30m

## 2018-07-21 NOTE — Telephone Encounter (Signed)
Tc to pharmacy on  07-21-18.  Left a message that patient is to take the lexapro 28m not 438mper dr. EaShea Evans And for them to go ahead and fill the rx for the lexapro 2091m

## 2018-07-28 ENCOUNTER — Encounter: Payer: Self-pay | Admitting: Psychiatry

## 2018-07-28 ENCOUNTER — Ambulatory Visit (INDEPENDENT_AMBULATORY_CARE_PROVIDER_SITE_OTHER): Payer: Medicare Other | Admitting: Psychiatry

## 2018-07-28 ENCOUNTER — Other Ambulatory Visit: Payer: Self-pay

## 2018-07-28 DIAGNOSIS — F5105 Insomnia due to other mental disorder: Secondary | ICD-10-CM | POA: Diagnosis not present

## 2018-07-28 DIAGNOSIS — F411 Generalized anxiety disorder: Secondary | ICD-10-CM

## 2018-07-28 DIAGNOSIS — F330 Major depressive disorder, recurrent, mild: Secondary | ICD-10-CM

## 2018-07-28 NOTE — Progress Notes (Signed)
TC on 07-28-18 @ 12:33 medication and pharmacy were reviewed and updated. Pt allergie swere reviewed. Pt medical and surgical hx was reviewed with no update. Pt vitals were not done because this is a phone visit.

## 2018-07-28 NOTE — Progress Notes (Signed)
Virtual Visit via Telephone Note  I connected with Cassidy [name] on 07/28/18 at  1:30 PM EDT by telephone and verified that I am speaking with the correct person using two identifiers.   I discussed the limitations, risks, security and privacy concerns of performing an evaluation and management service by telephone and the availability of in person appointments. I also discussed with the patient that there may be a patient responsible charge related to this service. The patient expressed understanding and agreed to proceed.   I discussed the assessment and treatment plan with the patient. The patient was provided an opportunity to ask questions and all were answered. The patient agreed with the plan and demonstrated an understanding of the instructions.   The patient was advised to call back or seek an in-person evaluation if the symptoms worsen or if the condition fails to improve as anticipated.  I provided 15 minutes of non-face-to-face time during this encounter.   Cassidy Alert, MD  [facility] MD OP Progress Note  07/28/2018 4:42 PM Cassidy [name]  MRN:  [id number]  Chief Complaint:  Chief Complaint    Follow-up     HPI: Cassidy [name] is a 78yrold CF, has a history of MDD, GAD , panic attacks , Insomnia , thrombocytopenia, hypothyroidism, chronic liver disease with portal hypertension was evaluated by phone today.  Patient as well as sister Cassidy Hawparticipated in evaluation today.  Patient today reports she is currently doing well on the current medication regimen.  She denies any significant anxiety or depressive symptoms. She reports sleep is good.  Patient denies any suicidality, homicidality or perceptual disturbances.  Patient is [name], oriented to person place situation.  Cassidy Hawher sister continues to be supportive and reports patient is doing well so far.  She is staying positive during the COVID-19 crisis, does not watch a lot of news, has been coping by watching funny movies a  lot.    Visit Diagnosis:    ICD-10-CM   1. MDD (major depressive disorder), recurrent episode, mild (HCC) F33.0    IMPROVING  2. GAD (generalized anxiety disorder) F41.1    IMPROVING  3. Insomnia due to mental disorder F51.05    IMPROVING    Past Psychiatric History: Reviewed past psychiatric history from my progress note on 06/23/2017.  Past Medical History:  Past Medical History:  Diagnosis Date  . Anxiety   . Depression   . Fatty liver   . GERD (gastroesophageal reflux disease)   . Hypothyroidism   . Obesity   . Thyroid disease     Past Surgical History:  Procedure Laterality Date  . APPENDECTOMY    . DILATATION & CURETTAGE/HYSTEROSCOPY WITH MYOSURE N/A 02/04/2018   Procedure: DILATATION & CURETTAGE/HYSTEROSCOPY WITH MYOSURE;  Surgeon: Ward, CHonor Loh MD;  Location: [facility] ORS;  Service: Gynecology;  Laterality: N/A;  . ESOPHAGOGASTRODUODENOSCOPY (EGD) WITH PROPOFOL N/A 12/20/2014   Procedure: ESOPHAGOGASTRODUODENOSCOPY (EGD) WITH PROPOFOL;  Surgeon: RManya Silvas MD;  [facility];  Service: Endoscopy;  Laterality: N/A;  . SAVORY DILATION N/A 12/20/2014   Procedure: SAVORY DILATION;  Surgeon: RManya Silvas MD;  [facility];  Service: Endoscopy;  Laterality: N/A;  . TONSILLECTOMY      Family Psychiatric History: Reviewed family psychiatric history from my progress note on 06/23/2017  Family History:  Family History  Problem Relation Age of Onset  . Diabetes Mother   . COPD Mother   . Kidney disease Mother   . Depression Mother   .  Hypertension Mother   . Heart attack Father   . Aneurysm Father   . Anxiety disorder Sister   . Depression Sister   . Diabetes Sister   . Hypertension Sister   . Atrial fibrillation Brother   . Spinal muscular atrophy Brother   . Breast cancer Sister   . Bone cancer Sister   . Depression Sister   . Anxiety disorder Sister   . Liver cancer Sister   . Breast cancer Sister   . Colon cancer Sister   .  Depression Sister   . Diabetes Sister   . COPD Sister   . Depression Sister   . Anxiety disorder Sister   . Skin cancer Sister   . Diabetes Brother   . Depression Brother   . Skin cancer Maternal Aunt   . Diabetes Maternal Aunt   . Cervical cancer Paternal Aunt     Social History: Reviewed social history from my progress note on 06/23/2017 Social History   Socioeconomic History  . Marital status: Widowed    Spouse name: Not on file  . Number of children: 3  . Years of education: Not on file  . Highest education level: Not on file  Occupational History  . Not on file  Social Needs  . Financial resource strain: Not on file  . Food insecurity:    Worry: Not on file    Inability: Not on file  . Transportation needs:    Medical: Not on file    Non-medical: Not on file  Tobacco Use  . Smoking status: Never Smoker  . Smokeless tobacco: Never Used  Substance and Sexual Activity  . Alcohol use: No  . Drug use: No  . Sexual activity: Not Currently    Birth control/protection: Post-menopausal  Lifestyle  . Physical activity:    Days per week: Not on file    Minutes per session: Not on file  . Stress: Not on file  Relationships  . Social connections:    Talks on phone: Not on file    Gets together: Not on file    Attends religious service: Not on file    Active member of club or organization: Not on file    Attends meetings of clubs or organizations: Not on file    Relationship status: Not on file  Other Topics Concern  . Not on file  Social History Narrative  . Not on file    Allergies:  Allergies  Allergen Reactions  . Sulfa Antibiotics Shortness Of Breath    Metabolic Disorder Labs: Lab Results  Component Value Date   HGBA1C 5.3 06/21/2016   MPG 105 06/21/2016   No results found for: PROLACTIN Lab Results  Component Value Date   CHOL 145 06/21/2016   TRIG 51 06/21/2016   HDL 44 06/21/2016   CHOLHDL 3.3 06/21/2016   VLDL 10 06/21/2016   LDLCALC 91  06/21/2016   LDLCALC 70 08/22/2013   Lab Results  Component Value Date   TSH 1.545 02/18/2018   TSH 1.683 12/18/2017    Therapeutic Level Labs: No results found for: LITHIUM No results found for: VALPROATE No components found for:  CBMZ  Current Medications: Current Outpatient Medications  Medication Sig Dispense Refill  . acidophilus (RISAQUAD) CAPS capsule Take 1 capsule by mouth 2 (two) times daily.    . busPIRone (BUSPAR) 15 MG tablet TAKE 1 TABLET BY MOUTH NIGHTLY 90 tablet 1  . clonazePAM (KLONOPIN) 0.5 MG tablet Take 1 tablet (0.5 mg total)  by mouth daily as needed for anxiety. Only for severe anxiety sx 20 tablet 1  . Copper Gluconate 2 MG TABS Take 1 tablet (2 mg total) by mouth daily. 30 tablet 0  . escitalopram (LEXAPRO) 20 MG tablet TAKE ONE TABLET BY MOUTH EVERY DAY 90 tablet 1  . fluticasone (FLONASE) 50 MCG/ACT nasal spray TAKE 2 SPRAYS INTO BOTH NOSTRILS TWICE ADAY    . furosemide (LASIX) 40 MG tablet Take 1 tablet (40 mg total) by mouth daily. 30 tablet 0  . levothyroxine (SYNTHROID, LEVOTHROID) 100 MCG tablet Take 1 tablet (100 mcg total) by mouth daily before breakfast. 30 tablet 0  . loperamide (IMODIUM) 2 MG capsule Take 2 mg by mouth as needed for diarrhea or loose stools.    . megestrol (MEGACE) 400 MG/10ML suspension Take 7.5 mLs (300 mg total) by mouth 2 (two) times daily. 240 mL 0  . Melatonin 1 MG TABS Take 5 mg by mouth at bedtime as needed (for sleep).     . nitrofurantoin, macrocrystal-monohydrate, (MACROBID) 100 MG capsule     . ondansetron (ZOFRAN-ODT) 8 MG disintegrating tablet Take 8 mg by mouth every 8 (eight) hours as needed for nausea or vomiting.    Marland Kitchen oxyCODONE (ROXICODONE) 5 MG immediate release tablet Take 1 tablet (5 mg total) by mouth every 6 (six) hours as needed for severe pain. 15 tablet 0  . potassium chloride (K-DUR,KLOR-CON) 10 MEQ tablet     . QUEtiapine (SEROQUEL) 100 MG tablet Take 1 tablet (100 mg total) by mouth at bedtime. 90  tablet 1  . spironolactone (ALDACTONE) 100 MG tablet Take 1 tablet (100 mg total) by mouth daily. 30 tablet 0  . traZODone (DESYREL) 150 MG tablet Take 1 tablet (150 mg total) by mouth at bedtime. 90 tablet 1  . traZODone (DESYREL) 50 MG tablet Take 1 tablet (50 mg total) by mouth at bedtime as needed for sleep. To be combined with 150 mg as needed 90 tablet 1  . vitamin C (VITAMIN C) 250 MG tablet Take 1 tablet (250 mg total) by mouth 2 (two) times daily. 30 tablet 0  . XIFAXAN 550 MG TABS tablet Take 1 tablet by mouth 2 (two) times daily.    Marland Kitchen omeprazole (PRILOSEC) 40 MG capsule Take 1 capsule (40 mg total) by mouth daily. 30 capsule 1   No current facility-administered medications for this visit.      Musculoskeletal: Strength & Muscle Tone: UTA Gait & Station: UTA Patient leans: N/A  Psychiatric Specialty Exam: Review of Systems  Psychiatric/Behavioral: The patient is nervous/anxious (improving).   All other systems reviewed and are negative.   There were no vitals taken for this visit.There is no height or weight on file to calculate BMI.  General Appearance: UTA  Eye Contact:  UTA  Speech:  Clear and Coherent  Volume:  Normal  Mood:  Anxious  Affect:  UTA  Thought Process:  Goal Directed and Descriptions of Associations: Intact  Orientation:  Other:  Self, situation, day, month,year  Thought Content: Logical   Suicidal Thoughts:  No  Homicidal Thoughts:  No  Memory:  Immediate;   Fair Recent;   Limited Remote;   Limited  Judgement:  Fair  Insight:  Fair  Psychomotor Activity:  UTA  Concentration:  Concentration: Fair and Attention Span: Fair  Recall:  [employer] of Knowledge: Fair  Language: Fair  Akathisia:  No  Handed:  Right  AIMS (if indicated): UTA  Assets:  Communication  Skills Desire for Improvement Social Support  ADL's:  Intact  Cognition: WNL  Sleep:  Fair   Screenings: AUDIT     Admission (Discharged) from 06/20/2016 in [place]  Alcohol Use Disorder Identification Test Final Score (AUDIT)  0    ECT-MADRS     ECT Treatment from 07/11/2016 in [place] Admission (Discharged) from 06/20/2016 in [place] Total Score  16  16    Mini-Mental     ECT Treatment from 07/11/2016 in [place] Admission (Discharged) from 06/20/2016 in [place]  Total Score (max 30 points )  30  28       Assessment and Plan: Vaughn is a 78 year old Caucasian female who has a history of depression, anxiety, insomnia, recent thrombocytopenia, chronic liver disease with portal hypertension, hypothyroidism was evaluated by phone today.  Patient is doing well.  Collateral information was obtained from Laureen Ochs who also reports patient is doing well.  Plan Depression-improving Lexapro 20 mg p.o. daily Seroquel 100 mg p.o. nightly  For anxiety-improving BuSpar 15 mg p.o. daily. Klonopin 0.5 mg as needed only for severe anxiety attacks.  For insomnia-improving  Trazodone 150 mg p.o. nightly She also has trazodone 50 mg as needed available if she has severe insomnia.  Patient to follow-up in clinic in 3 months or sooner if needed.  I have spent atleast 15 minutes non face to face with patient today. More than 50 % of the time was spent for psychoeducation and supportive psychotherapy and care coordination.  This note was generated in part or whole with voice recognition software. Voice recognition is usually quite accurate but there are transcription errors that can and very often do occur. I apologize for any typographical errors that were not detected and corrected.       Cassidy Alert, MD 07/28/2018, 4:42 PM

## 2018-08-05 ENCOUNTER — Telehealth: Payer: Self-pay | Admitting: Student

## 2018-08-05 NOTE — Telephone Encounter (Signed)
Palliative NP called to check on patient and to schedule a f/u appointment. [name] states a nurse was out yesterday to check on her sent by Dr. Ouida Sills. She states patient is doing well. Edema has improved, appetite is good and she is sleeping well. Offered telehealth visit; she wants NP to call back in a couple of weeks and schedule at that time. She is encouraged to call with questions.

## 2018-08-12 ENCOUNTER — Ambulatory Visit (INDEPENDENT_AMBULATORY_CARE_PROVIDER_SITE_OTHER): Payer: Medicare Other | Admitting: Gastroenterology

## 2018-08-12 DIAGNOSIS — K7469 Other cirrhosis of liver: Secondary | ICD-10-CM | POA: Diagnosis not present

## 2018-08-12 DIAGNOSIS — J948 Other specified pleural conditions: Secondary | ICD-10-CM | POA: Diagnosis not present

## 2018-08-12 DIAGNOSIS — K7290 Hepatic failure, unspecified without coma: Secondary | ICD-10-CM

## 2018-08-12 DIAGNOSIS — K7460 Unspecified cirrhosis of liver: Secondary | ICD-10-CM

## 2018-08-12 DIAGNOSIS — E559 Vitamin D deficiency, unspecified: Secondary | ICD-10-CM

## 2018-08-12 NOTE — Progress Notes (Signed)
Sherri Sear, MD [address]  [place]  [place], [place] [address]  Main: [phone]  Fax: [phone]    Gastroenterology follow-up video Visit  Referring Provider:     Kirk Ruths, MD Primary Care Physician:  Kirk Ruths, MD Primary Gastroenterologist:  Dr. Cephas Darby Reason for Consultation:     Hepatic hydrothorax, decompensated cirrhosis        HPI:   Cassidy [name] is a 78 y.o. female referred by Dr. Ouida Sills, Cassidy Cornfield, MD  for consultation & management of decompensated cirrhosis, hepatic hydrothorax  Virtual Visit Video Note  I connected with Cassidy [name] on 08/12/18 at 10:30 AM EDT by video and verified that I am speaking with the correct person using two identifiers.   I discussed the limitations, risks, security and privacy concerns of performing an evaluation and management service by video and the availability of in person appointments. I also discussed with the patient that there may be a patient responsible charge related to this service. The patient expressed understanding and agreed to proceed.  Location of the Patient: Home  Location of the provider: Home office  Additional persons participating in the visit: Patient, patient's sister, patient's grandson   History of Present Illness: 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  Currently, her hydrothorax has been managed with loop diuretics.  She really came along well in her course with regards to hydrothorax and decompensated cirrhosis.  She has good support system with her family and also palliative care has been very helpful.  She does not have any major concerns today.  She denies chest pain, abdominal pain, black stools, rectal bleeding, nausea, vomiting.  Her grandson reassured that she has been doing well overall other than sometimes feels short of breath on exertion.  She does have intermittent  episodes where she is more confused than normal.  He also reports that she has been eating very well.  She denies any swelling of legs or abdominal distention.  She is currently taking furosemide 40 mg daily along with spironolactone 100 mg daily   NSAIDs:None  Antiplts/Anticoagulants/Anti thrombotics:None  GI Procedures:She had several EGDs and colonoscopies in the past. No known varices or portal hypertensive gastropathy Biopsies negative for H. pylori infection EGD 12/20/2014 - Moderate Schatzki ring. Dilated. - Erythematous mucosa in the antrum. Biopsied. - Normal examined duodenum. DIAGNOSIS:  A. STOMACH; COLD BIOPSY:  - ANTRAL MUCOSA WITH FEATURES OF REACTIVE GASTROPATHY, SEE NOTE.  - OXYNTIC MUCOSA WITH FOCAL MINIMAL CHRONIC GASTRITIS.  - NEGATIVE FOR H. PYLORI, DYSPLASIA, AND MALIGNANCY  Past Medical History:  Diagnosis Date  . Anxiety   . Depression   . Fatty liver   . GERD (gastroesophageal reflux disease)   . Hypothyroidism   . Obesity   . Thyroid disease     Past Surgical History:  Procedure Laterality Date  . APPENDECTOMY    . DILATATION & CURETTAGE/HYSTEROSCOPY WITH MYOSURE N/A 02/04/2018   Procedure: DILATATION & CURETTAGE/HYSTEROSCOPY WITH MYOSURE;  Surgeon: Ward, Honor Loh, MD;  Location: [facility] ORS;  Service: Gynecology;  Laterality: N/A;  . ESOPHAGOGASTRODUODENOSCOPY (EGD) WITH PROPOFOL N/A 12/20/2014   Procedure: ESOPHAGOGASTRODUODENOSCOPY (EGD) WITH PROPOFOL;  Surgeon: Manya Silvas, MD;  Location: [facility];  Service: Endoscopy;  Laterality: N/A;  . SAVORY DILATION N/A 12/20/2014   Procedure: SAVORY DILATION;  Surgeon: Manya Silvas, MD;  Location[facility];  Service: Endoscopy;  Laterality: N/A;  .  TONSILLECTOMY      Current Outpatient Medications:  .  acidophilus (RISAQUAD) CAPS capsule, Take 1 capsule by mouth 2 (two) times daily., Disp: , Rfl:  .  busPIRone (BUSPAR) 15 MG tablet, TAKE 1 TABLET BY MOUTH NIGHTLY, Disp: 90 tablet,  Rfl: 1 .  clonazePAM (KLONOPIN) 0.5 MG tablet, Take 1 tablet (0.5 mg total) by mouth daily as needed for anxiety. Only for severe anxiety sx, Disp: 20 tablet, Rfl: 1 .  escitalopram (LEXAPRO) 20 MG tablet, TAKE ONE TABLET BY MOUTH EVERY DAY, Disp: 90 tablet, Rfl: 1 .  fluticasone (FLONASE) 50 MCG/ACT nasal spray, TAKE 2 SPRAYS INTO BOTH NOSTRILS TWICE ADAY, Disp: , Rfl:  .  furosemide (LASIX) 40 MG tablet, Take 1 tablet (40 mg total) by mouth daily., Disp: 30 tablet, Rfl: 0 .  levothyroxine (SYNTHROID, LEVOTHROID) 100 MCG tablet, Take 1 tablet (100 mcg total) by mouth daily before breakfast., Disp: 30 tablet, Rfl: 0 .  ondansetron (ZOFRAN-ODT) 8 MG disintegrating tablet, Take 8 mg by mouth every 8 (eight) hours as needed for nausea or vomiting., Disp: , Rfl:  .  oxyCODONE (ROXICODONE) 5 MG immediate release tablet, Take 1 tablet (5 mg total) by mouth every 6 (six) hours as needed for severe pain., Disp: 15 tablet, Rfl: 0 .  QUEtiapine (SEROQUEL) 100 MG tablet, Take 1 tablet (100 mg total) by mouth at bedtime., Disp: 90 tablet, Rfl: 1 .  spironolactone (ALDACTONE) 100 MG tablet, Take 1 tablet (100 mg total) by mouth daily., Disp: 30 tablet, Rfl: 0 .  traZODone (DESYREL) 50 MG tablet, Take 1 tablet (50 mg total) by mouth at bedtime as needed for sleep. To be combined with 150 mg as needed, Disp: 90 tablet, Rfl: 1 .  XIFAXAN 550 MG TABS tablet, Take 1 tablet by mouth 2 (two) times daily., Disp: , Rfl:  .  Copper Gluconate 2 MG TABS, Take 1 tablet (2 mg total) by mouth daily. (Patient not taking: Reported on 08/12/2018), Disp: 30 tablet, Rfl: 0 .  loperamide (IMODIUM) 2 MG capsule, Take 2 mg by mouth as needed for diarrhea or loose stools., Disp: , Rfl:  .  megestrol (MEGACE) 400 MG/10ML suspension, Take 7.5 mLs (300 mg total) by mouth 2 (two) times daily. (Patient not taking: Reported on 08/12/2018), Disp: 240 mL, Rfl: 0 .  Melatonin 1 MG TABS, Take 5 mg by mouth at bedtime as needed (for sleep). , Disp: ,  Rfl:  .  nitrofurantoin, macrocrystal-monohydrate, (MACROBID) 100 MG capsule, , Disp: , Rfl:  .  omeprazole (PRILOSEC) 40 MG capsule, Take 1 capsule (40 mg total) by mouth daily., Disp: 30 capsule, Rfl: 1 .  potassium chloride (K-DUR,KLOR-CON) 10 MEQ tablet, , Disp: , Rfl:  .  vitamin C (VITAMIN C) 250 MG tablet, Take 1 tablet (250 mg total) by mouth 2 (two) times daily. (Patient not taking: Reported on 08/12/2018), Disp: 30 tablet, Rfl: 0    Family History  Problem Relation Age of Onset  . Diabetes Mother   . COPD Mother   . Kidney disease Mother   . Depression Mother   . Hypertension Mother   . Heart attack Father   . Aneurysm Father   . Anxiety disorder Sister   . Depression Sister   . Diabetes Sister   . Hypertension Sister   . Atrial fibrillation Brother   . Spinal muscular atrophy Brother   . Breast cancer Sister   . Bone cancer Sister   . Depression Sister   .  Anxiety disorder Sister   . Liver cancer Sister   . Breast cancer Sister   . Colon cancer Sister   . Depression Sister   . Diabetes Sister   . COPD Sister   . Depression Sister   . Anxiety disorder Sister   . Skin cancer Sister   . Diabetes Brother   . Depression Brother   . Skin cancer Maternal Aunt   . Diabetes Maternal Aunt   . Cervical cancer Paternal Aunt      Social History   Tobacco Use  . Smoking status: Never Smoker  . Smokeless tobacco: Never Used  Substance Use Topics  . Alcohol use: No  . Drug use: No    Allergies as of 08/12/2018 - Review Complete 08/12/2018  Allergen Reaction Noted  . Sulfa antibiotics Shortness Of Breath 11/27/2014     Imaging Studies: Reviewed  Assessment and Plan:   THEONE BOWELL is a 78 y.o. 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  Patient overall doing well clinically, palliative care on  board  Hepatic hydrothorax Continue furosemide 40 mg, spironolactone 100 mg daily Continue to maintain low-sodium diet Recommend chest x-ray  Decompensated cirrhosis  Varices:EGD in 2016 did not reveal varices or portal hypertensive gastropathy No evidence of active GI bleed.  Defer EGD at this time given her age and overall medical condition  POE:UMPNTIRW rifaximin, MiraLAX 1 to 2 cups daily to have at least 2 soft bowel movements per day HCC screening: No evidence of liver lesions based on the CT scan Check AFP Recheck labs  Patient's grandson told me that palliative care services might be able to get her labs and chest x-ray done at her home  Follow Up Instructions:   I discussed the assessment and treatment plan with the patient. The patient was provided an opportunity to ask questions and all were answered. The patient agreed with the plan and demonstrated an understanding of the instructions.   The patient was advised to call back or seek an in-person evaluation if the symptoms worsen or if the condition fails to improve as anticipated.  I provided 15 minutes of face-to-face time during this encounter.   Follow up in 3 months   Cephas Darby, MD

## 2018-08-16 ENCOUNTER — Telehealth: Payer: Self-pay | Admitting: Gastroenterology

## 2018-08-16 ENCOUNTER — Other Ambulatory Visit: Payer: Self-pay

## 2018-08-16 DIAGNOSIS — K7469 Other cirrhosis of liver: Secondary | ICD-10-CM

## 2018-08-16 DIAGNOSIS — J948 Other specified pleural conditions: Secondary | ICD-10-CM

## 2018-08-16 DIAGNOSIS — E559 Vitamin D deficiency, unspecified: Secondary | ICD-10-CM

## 2018-08-16 DIAGNOSIS — R0602 Shortness of breath: Secondary | ICD-10-CM

## 2018-08-16 DIAGNOSIS — K7290 Hepatic failure, unspecified without coma: Secondary | ICD-10-CM

## 2018-08-16 DIAGNOSIS — K7460 Unspecified cirrhosis of liver: Secondary | ICD-10-CM

## 2018-08-16 NOTE — Telephone Encounter (Signed)
Labs have been released and chest x-ray has been ordered, pt's caretaker has been notified and verbalized understanding

## 2018-08-16 NOTE — Telephone Encounter (Signed)
Cassidy [name] called to schedule patient's appt 11-11-18 @ 2:30 pm with Dr Marius Ditch from the visit of 08-12-18. She needs to have the lab work & chest x-ray done in the same day @ the hospital because palliative care is doing like we are by phone & not going out during this time. Please contact [name] to schedule.

## 2018-08-17 ENCOUNTER — Ambulatory Visit
Admission: RE | Admit: 2018-08-17 | Discharge: 2018-08-17 | Disposition: A | Discharge status: Home / Self Care | Payer: Medicare Other | Source: Ambulatory Visit | Attending: Gastroenterology | Admitting: Gastroenterology

## 2018-08-17 ENCOUNTER — Other Ambulatory Visit
Admission: RE | Admit: 2018-08-17 | Discharge: 2018-08-17 | Disposition: A | Discharge status: Home / Self Care | Payer: Medicare Other | Source: Ambulatory Visit | Attending: Gastroenterology | Admitting: Gastroenterology

## 2018-08-17 DIAGNOSIS — R918 Other nonspecific abnormal finding of lung field: Secondary | ICD-10-CM | POA: Insufficient documentation

## 2018-08-17 DIAGNOSIS — J90 Pleural effusion, not elsewhere classified: Secondary | ICD-10-CM | POA: Insufficient documentation

## 2018-08-17 DIAGNOSIS — R0602 Shortness of breath: Secondary | ICD-10-CM | POA: Insufficient documentation

## 2018-08-17 LAB — COMPREHENSIVE METABOLIC PANEL
ALT: 15 U/L (ref 0–44)
AST: 25 U/L (ref 15–41)
Albumin: 3.2 g/dL — ABNORMAL LOW (ref 3.5–5.0)
Alkaline Phosphatase: 83 U/L (ref 38–126)
Anion gap: 7 (ref 5–15)
BUN: 7 mg/dL — ABNORMAL LOW (ref 8–23)
CO2: 27 mmol/L (ref 22–32)
Calcium: 8.9 mg/dL (ref 8.9–10.3)
Chloride: 102 mmol/L (ref 98–111)
Creatinine, Ser: 0.71 mg/dL (ref 0.44–1.00)
GFR calc Af Amer: >60 mL/min (ref >60)
GFR calc non Af Amer: >60 mL/min (ref >60)
Glucose, Bld: 91 mg/dL (ref 70–99)
Potassium: 3.8 mmol/L (ref 3.5–5.1)
Sodium: 136 mmol/L (ref 135–145)
Total Bilirubin: 1.4 mg/dL — ABNORMAL HIGH (ref 0.3–1.2)
Total Protein: 6.7 g/dL (ref 6.5–8.1)

## 2018-08-17 LAB — CBC
HCT: 35.7 % — ABNORMAL LOW (ref 36.0–46.0)
Hemoglobin: 11.4 g/dL — ABNORMAL LOW (ref 12.0–15.0)
MCH: 27.4 pg (ref 26.0–34.0)
MCHC: 31.9 g/dL (ref 30.0–36.0)
MCV: 85.8 fL (ref 80.0–100.0)
Platelets: 90 10*3/uL — ABNORMAL LOW (ref 150–400)
RBC: 4.16 MIL/uL (ref 3.87–5.11)
RDW: 17 % — ABNORMAL HIGH (ref 11.5–15.5)
WBC: 3.1 10*3/uL — ABNORMAL LOW (ref 4.0–10.5)
nRBC: 0 % (ref 0.0–0.2)

## 2018-08-17 LAB — MAGNESIUM: Magnesium: 2 mg/dL (ref 1.7–2.4)

## 2018-08-17 LAB — PHOSPHORUS: Phosphorus: 3.8 mg/dL (ref 2.5–4.6)

## 2018-08-17 NOTE — Telephone Encounter (Signed)
Chest x-ray and labs have been completed and all orders have been signed.

## 2018-08-17 NOTE — Telephone Encounter (Signed)
Sharyn Lull from [facility] is calling pt is there getting chest X ray they have the order but are missing orders for the Lab please call [phone]

## 2018-08-18 LAB — VITAMIN D 25 HYDROXY (VIT D DEFICIENCY, FRACTURES): Vit D, 25-Hydroxy: 24.3 ng/mL — ABNORMAL LOW (ref 30.0–100.0)

## 2018-08-18 LAB — AFP TUMOR MARKER: AFP, Serum, Tumor Marker: 1.5 ng/mL (ref 0.0–8.3)

## 2018-08-19 ENCOUNTER — Other Ambulatory Visit: Payer: Self-pay

## 2018-08-19 DIAGNOSIS — R0602 Shortness of breath: Secondary | ICD-10-CM

## 2018-08-19 DIAGNOSIS — K7290 Hepatic failure, unspecified without coma: Secondary | ICD-10-CM

## 2018-08-24 ENCOUNTER — Ambulatory Visit: Payer: Self-pay | Admitting: Gastroenterology

## 2018-08-24 ENCOUNTER — Other Ambulatory Visit: Payer: Self-pay | Admitting: Psychiatry

## 2018-08-24 DIAGNOSIS — F411 Generalized anxiety disorder: Secondary | ICD-10-CM

## 2018-08-24 DIAGNOSIS — F330 Major depressive disorder, recurrent, mild: Secondary | ICD-10-CM

## 2018-08-26 ENCOUNTER — Other Ambulatory Visit
Admission: RE | Admit: 2018-08-26 | Discharge: 2018-08-26 | Disposition: A | Discharge status: Home / Self Care | Payer: Medicare Other | Source: Ambulatory Visit | Attending: Gastroenterology | Admitting: Gastroenterology

## 2018-08-26 ENCOUNTER — Ambulatory Visit
Admission: RE | Admit: 2018-08-26 | Discharge: 2018-08-26 | Disposition: A | Discharge status: Home / Self Care | Payer: Medicare Other | Source: Ambulatory Visit | Attending: Gastroenterology | Admitting: Gastroenterology

## 2018-08-26 DIAGNOSIS — R0602 Shortness of breath: Secondary | ICD-10-CM

## 2018-08-26 DIAGNOSIS — K7290 Hepatic failure, unspecified without coma: Secondary | ICD-10-CM | POA: Diagnosis present

## 2018-08-26 DIAGNOSIS — K7460 Unspecified cirrhosis of liver: Secondary | ICD-10-CM

## 2018-08-26 LAB — BASIC METABOLIC PANEL
Anion gap: 10 (ref 5–15)
BUN: 11 mg/dL (ref 8–23)
CO2: 26 mmol/L (ref 22–32)
Calcium: 8.9 mg/dL (ref 8.9–10.3)
Chloride: 99 mmol/L (ref 98–111)
Creatinine, Ser: 0.86 mg/dL (ref 0.44–1.00)
GFR calc Af Amer: >60 mL/min (ref >60)
GFR calc non Af Amer: >60 mL/min (ref >60)
Glucose, Bld: 99 mg/dL (ref 70–99)
Potassium: 3.5 mmol/L (ref 3.5–5.1)
Sodium: 135 mmol/L (ref 135–145)

## 2018-09-01 ENCOUNTER — Other Ambulatory Visit: Payer: Self-pay

## 2018-09-01 ENCOUNTER — Telehealth: Payer: Self-pay | Admitting: Gastroenterology

## 2018-09-01 DIAGNOSIS — K7460 Unspecified cirrhosis of liver: Secondary | ICD-10-CM

## 2018-09-01 DIAGNOSIS — K7290 Hepatic failure, unspecified without coma: Secondary | ICD-10-CM

## 2018-09-01 DIAGNOSIS — R0602 Shortness of breath: Secondary | ICD-10-CM

## 2018-09-01 NOTE — Telephone Encounter (Signed)
Her chest x-ray revealed mild improvement in the pleural effusion after increasing Lasix to 40 mg twice daily.  I recommend to continue at this dose as long as her kidney function and electrolytes are normal and her pleural effusion is under control.  Recheck BMP and chest x-ray in 2 to 3 weeks  Cephas Darby, MD [address]  [place]  [place], [place] [address]  Main: [phone]  Fax: [phone] Pager: [phone]

## 2018-09-01 NOTE — Telephone Encounter (Signed)
Pt's sister has been notified and verbalized understanding, chest x-ray and BMP has been ordered and released

## 2018-09-01 NOTE — Telephone Encounter (Signed)
-----   Message from Darl Householder, RMA sent at 09/01/2018 10:30 AM EDT ----- Pt's sister [name] would like to know how long does she need to take the lasix? ----- Message ----- From: Lin Landsman, MD Sent: 08/31/2018   2:22 PM EDT To: Darl Householder, RMA  Her SOB is due to pleural effusion for which she is on lasix and spironolactone. Her kidney function and electrolytes are normal. And, she is not taking too much fluid pills, her dose is being adjusted to reduce pleural effusion. I advice that she should continue taking lasix 73m BID and Spironolactone 100 mg  Thanks RV ----- Message ----- From: GDarl Householder RMA Sent: 08/31/2018  11:07 AM EDT To: RLin Landsman MD  Pt has been notified of results and verbalized understanding, pt's caretaker LLattie Hawwould like to know if t is taking to much fluid pills due to she is having SOB and extreme fatigue, pt currently takes Spironolactone 100 mg as well as 80 mg of lasix, please advise.

## 2018-09-09 ENCOUNTER — Other Ambulatory Visit: Payer: Self-pay | Admitting: Psychiatry

## 2018-09-20 ENCOUNTER — Ambulatory Visit
Admission: RE | Admit: 2018-09-20 | Discharge: 2018-09-20 | Disposition: A | Discharge status: Home / Self Care | Payer: Medicare Other | Source: Ambulatory Visit | Attending: Gastroenterology | Admitting: Gastroenterology

## 2018-09-20 ENCOUNTER — Other Ambulatory Visit: Payer: Self-pay

## 2018-09-20 ENCOUNTER — Other Ambulatory Visit
Admission: RE | Admit: 2018-09-20 | Discharge: 2018-09-20 | Disposition: A | Discharge status: Home / Self Care | Payer: Medicare Other | Source: Ambulatory Visit | Attending: Gastroenterology | Admitting: Gastroenterology

## 2018-09-20 DIAGNOSIS — R0602 Shortness of breath: Secondary | ICD-10-CM

## 2018-09-20 LAB — BASIC METABOLIC PANEL
Anion gap: 8 (ref 5–15)
BUN: 10 mg/dL (ref 8–23)
CO2: 24 mmol/L (ref 22–32)
Calcium: 8.8 mg/dL — ABNORMAL LOW (ref 8.9–10.3)
Chloride: 104 mmol/L (ref 98–111)
Creatinine, Ser: 0.82 mg/dL (ref 0.44–1.00)
GFR calc Af Amer: >60 mL/min (ref >60)
GFR calc non Af Amer: >60 mL/min (ref >60)
Glucose, Bld: 121 mg/dL — ABNORMAL HIGH (ref 70–99)
Potassium: 4 mmol/L (ref 3.5–5.1)
Sodium: 136 mmol/L (ref 135–145)

## 2018-10-08 ENCOUNTER — Other Ambulatory Visit: Payer: Self-pay | Admitting: Physician Assistant

## 2018-10-08 ENCOUNTER — Ambulatory Visit
Admission: RE | Admit: 2018-10-08 | Discharge: 2018-10-08 | Disposition: A | Discharge status: Home / Self Care | Payer: Medicare Other | Source: Ambulatory Visit | Attending: Physician Assistant | Admitting: Physician Assistant

## 2018-10-08 ENCOUNTER — Other Ambulatory Visit: Payer: Self-pay

## 2018-10-08 DIAGNOSIS — G4489 Other headache syndrome: Secondary | ICD-10-CM | POA: Diagnosis not present

## 2018-10-20 ENCOUNTER — Ambulatory Visit: Payer: Medicare Other | Admitting: Psychiatry

## 2018-10-20 ENCOUNTER — Other Ambulatory Visit: Payer: Self-pay

## 2018-11-11 ENCOUNTER — Ambulatory Visit (INDEPENDENT_AMBULATORY_CARE_PROVIDER_SITE_OTHER): Payer: Medicare Other | Admitting: Gastroenterology

## 2018-11-11 ENCOUNTER — Other Ambulatory Visit: Payer: Self-pay

## 2018-11-11 DIAGNOSIS — K7469 Other cirrhosis of liver: Secondary | ICD-10-CM | POA: Diagnosis not present

## 2018-11-11 NOTE — Progress Notes (Signed)
Sherri Sear, MD [address]  [place]  [place], [place] [address]  Main: [phone]  Fax: [phone]    Gastroenterology follow-up tele Visit  Referring Provider:     Kirk Ruths, MD Primary Care Physician:  Kirk Ruths, MD Primary Gastroenterologist:  Dr. Cephas Darby Reason for Consultation:     Hepatic hydrothorax, decompensated cirrhosis        HPI:   Cassidy [name] is a 78 y.o. female referred by Dr. Ouida Sills, Ocie Cornfield, MD  for consultation & management of decompensated cirrhosis, hepatic hydrothorax  Virtual Visit Video Note  I connected with Cassidy [name] on 11/11/18 at  2:30 PM EDT by phone and verified that I am speaking with the correct person using two identifiers.   I discussed the limitations, risks, security and privacy concerns of performing an evaluation and management service by telephone and the availability of in person appointments. I also discussed with the patient that there may be a patient responsible charge related to this service. The patient expressed understanding and agreed to proceed.  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  Currently, her hydrothorax has been managed with loop diuretics.  She really came along well in her course with regards to hydrothorax and decompensated cirrhosis.  She has good support system with her family and also palliative care has been very helpful.  She does not have any major concerns today.  She denies chest pain, abdominal pain, black stools, rectal bleeding, nausea, vomiting.  Her grandson reassured that she has been doing well overall other than sometimes feels short of breath on exertion.  She does have intermittent episodes where she  is more confused than normal.  He also reports that she has been eating very well.  She denies any swelling of legs or abdominal distention.  She is currently taking furosemide 40 mg daily along with spironolactone 100 mg daily  Follow-up visit 11/11/2018 Spoke to patient's sister who is her power of attorney.  Patient has been behaving dull for the last few days, her urine is dark yellow.  She thinks she is not drinking enough water.  Otherwise, she has been stable.  Denies abdominal distention, swelling of legs, worsening shortness of breath.  Her appetite is okay.  She is eating soft foods only.  Her weight is reportedly stable.  She had a fall last month, underwent head CT which was negative for bleed.  She is taking Lasix 40 mg and spironolactone 100 mg daily.  She reports having regular bowel movements with MiraLAX, she denies any black stools, rectal bleeding, hematemesis    NSAIDs:None  Antiplts/Anticoagulants/Anti thrombotics:None  GI Procedures:She had several EGDs and colonoscopies in the past. No known varices or portal hypertensive gastropathy Biopsies negative for H. pylori infection EGD 12/20/2014 - Moderate Schatzki ring. Dilated. - Erythematous mucosa in the antrum. Biopsied. - Normal examined duodenum. DIAGNOSIS:  A. STOMACH; COLD BIOPSY:  - ANTRAL MUCOSA WITH FEATURES OF REACTIVE GASTROPATHY, SEE NOTE.  - OXYNTIC MUCOSA WITH FOCAL MINIMAL CHRONIC GASTRITIS.  - NEGATIVE FOR H. PYLORI, DYSPLASIA, AND MALIGNANCY  Past Medical History:  Diagnosis Date  . Anxiety   . Depression   . Fatty liver   . GERD (gastroesophageal reflux disease)   . Hypothyroidism   .  Obesity   . Thyroid disease     Past Surgical History:  Procedure Laterality Date  . APPENDECTOMY    . DILATATION & CURETTAGE/HYSTEROSCOPY WITH MYOSURE N/A 02/04/2018   Procedure: DILATATION & CURETTAGE/HYSTEROSCOPY WITH MYOSURE;  Surgeon: Ward, Honor Loh, MD;  Location: [facility] ORS;  Service: Gynecology;   Laterality: N/A;  . ESOPHAGOGASTRODUODENOSCOPY (EGD) WITH PROPOFOL N/A 12/20/2014   Procedure: ESOPHAGOGASTRODUODENOSCOPY (EGD) WITH PROPOFOL;  Surgeon: Manya Silvas, MD;  Location: [facility] ENDOSCOPY;  Service: Endoscopy;  Laterality: N/A;  . SAVORY DILATION N/A 12/20/2014   Procedure: SAVORY DILATION;  Surgeon: Manya Silvas, MD;  Location: [facility] ENDOSCOPY;  Service: Endoscopy;  Laterality: N/A;  . TONSILLECTOMY      Current Outpatient Medications:  .  busPIRone (BUSPAR) 15 MG tablet, TAKE 1 TABLET BY MOUTH NIGHTLY, Disp: 90 tablet, Rfl: 1 .  clonazePAM (KLONOPIN) 0.5 MG tablet, TAKE 1 TABLET BY MOUTH DAILY AS NEEDED FOR ANXIETY. ONLY FOR SEVERE ANXIETY SYMPTOMS, Disp: 20 tablet, Rfl: 0 .  escitalopram (LEXAPRO) 20 MG tablet, TAKE ONE TABLET BY MOUTH EVERY DAY, Disp: 90 tablet, Rfl: 1 .  fluticasone (FLONASE) 50 MCG/ACT nasal spray, TAKE 2 SPRAYS INTO BOTH NOSTRILS TWICE ADAY, Disp: , Rfl:  .  furosemide (LASIX) 40 MG tablet, Take 1 tablet (40 mg total) by mouth daily., Disp: 30 tablet, Rfl: 0 .  levothyroxine (SYNTHROID, LEVOTHROID) 100 MCG tablet, Take 1 tablet (100 mcg total) by mouth daily before breakfast., Disp: 30 tablet, Rfl: 0 .  ondansetron (ZOFRAN-ODT) 8 MG disintegrating tablet, Take 8 mg by mouth every 8 (eight) hours as needed for nausea or vomiting., Disp: , Rfl:  .  QUEtiapine (SEROQUEL) 100 MG tablet, Take 1 tablet (100 mg total) by mouth at bedtime., Disp: 90 tablet, Rfl: 1 .  spironolactone (ALDACTONE) 100 MG tablet, Take 1 tablet (100 mg total) by mouth daily., Disp: 30 tablet, Rfl: 0 .  traZODone (DESYREL) 50 MG tablet, Take 1 tablet (50 mg total) by mouth at bedtime as needed for sleep. To be combined with 150 mg as needed, Disp: 90 tablet, Rfl: 1 .  XIFAXAN 550 MG TABS tablet, Take 1 tablet by mouth 2 (two) times daily., Disp: , Rfl:  .  omeprazole (PRILOSEC) 40 MG capsule, Take 1 capsule (40 mg total) by mouth daily., Disp: 30 capsule, Rfl: 1    Family History   Problem Relation Age of Onset  . Diabetes Mother   . COPD Mother   . Kidney disease Mother   . Depression Mother   . Hypertension Mother   . Heart attack Father   . Aneurysm Father   . Anxiety disorder Sister   . Depression Sister   . Diabetes Sister   . Hypertension Sister   . Atrial fibrillation Brother   . Spinal muscular atrophy Brother   . Breast cancer Sister   . Bone cancer Sister   . Depression Sister   . Anxiety disorder Sister   . Liver cancer Sister   . Breast cancer Sister   . Colon cancer Sister   . Depression Sister   . Diabetes Sister   . COPD Sister   . Depression Sister   . Anxiety disorder Sister   . Skin cancer Sister   . Diabetes Brother   . Depression Brother   . Skin cancer Maternal Aunt   . Diabetes Maternal Aunt   . Cervical cancer Paternal Aunt      Social History   Tobacco Use  . Smoking status:  Never Smoker  . Smokeless tobacco: Never Used  Substance Use Topics  . Alcohol use: No  . Drug use: No    Allergies as of 11/11/2018 - Review Complete 11/11/2018  Allergen Reaction Noted  . Sulfa antibiotics Shortness Of Breath 11/27/2014     Imaging Studies: Reviewed  Assessment and Plan:   ALAZAE CRYMES is a 78 y.o. 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  Patient overall doing well clinically, palliative care on board  Hepatic hydrothorax Continue furosemide 40 mg, spironolactone 100 mg daily Continue to maintain low-sodium diet Check BMP  Decompensated cirrhosis  Varices:EGD in 2016 did not reveal varices or portal hypertensive gastropathy No evidence of active GI bleed.  Defer EGD at this time given her age and overall medical condition  ZTI:WPYKDXIP rifaximin, MiraLAX 1 to 2 cups daily to have at least 2 soft bowel movements per day HCC screening: No evidence  of liver lesions based on the CT scan, AFP normal Patient is followed by palliative care services   Follow Up Instructions:   I discussed the assessment and treatment plan with the patient. The patient was provided an opportunity to ask questions and all were answered. The patient agreed with the plan and demonstrated an understanding of the instructions.   The patient was advised to call back or seek an in-person evaluation if the symptoms worsen or if the condition fails to improve as anticipated.  I provided 15 minutes of face-to-face time during this encounter.   Follow up in 3 months   Cephas Darby, MD

## 2018-11-12 ENCOUNTER — Other Ambulatory Visit
Admission: RE | Admit: 2018-11-12 | Discharge: 2018-11-12 | Disposition: A | Discharge status: Home / Self Care | Payer: Medicare Other | Source: Ambulatory Visit | Attending: Gastroenterology | Admitting: Gastroenterology

## 2018-11-12 ENCOUNTER — Other Ambulatory Visit: Payer: Self-pay

## 2018-11-12 DIAGNOSIS — K7469 Other cirrhosis of liver: Secondary | ICD-10-CM | POA: Diagnosis present

## 2018-11-12 LAB — BASIC METABOLIC PANEL
Anion gap: 9 (ref 5–15)
BUN: 12 mg/dL (ref 8–23)
CO2: 22 mmol/L (ref 22–32)
Calcium: 8.3 mg/dL — ABNORMAL LOW (ref 8.9–10.3)
Chloride: 103 mmol/L (ref 98–111)
Creatinine, Ser: 0.85 mg/dL (ref 0.44–1.00)
GFR calc Af Amer: >60 mL/min (ref >60)
GFR calc non Af Amer: >60 mL/min (ref >60)
Glucose, Bld: 170 mg/dL — ABNORMAL HIGH (ref 70–99)
Potassium: 3.8 mmol/L (ref 3.5–5.1)
Sodium: 134 mmol/L — ABNORMAL LOW (ref 135–145)

## 2018-11-22 ENCOUNTER — Other Ambulatory Visit: Payer: Self-pay | Admitting: Psychiatry

## 2018-11-22 DIAGNOSIS — F330 Major depressive disorder, recurrent, mild: Secondary | ICD-10-CM

## 2018-11-22 MED ORDER — QUETIAPINE FUMARATE 100 MG PO TABS: 100.0000 mg | ORAL_TABLET | Freq: Every day | ORAL | 1 refills | Status: DC

## 2018-11-22 NOTE — Telephone Encounter (Signed)
Sent seroquel.

## 2018-11-23 ENCOUNTER — Telehealth: Payer: Self-pay | Admitting: Student

## 2018-11-23 NOTE — Telephone Encounter (Signed)
Palliative NP, she states patient is doing okay. She declines any needs at this time or need for visit. Would like a PC in next couple of weeks. Patient is scheduled to see PCP tomorrow. She is encouraged to call as needs/ questions arise.

## 2018-11-24 DIAGNOSIS — Z9989 Dependence on other enabling machines and devices: Secondary | ICD-10-CM | POA: Insufficient documentation

## 2018-11-24 DIAGNOSIS — F0390 Unspecified dementia without behavioral disturbance: Secondary | ICD-10-CM | POA: Insufficient documentation

## 2019-01-13 DIAGNOSIS — I509 Heart failure, unspecified: Secondary | ICD-10-CM | POA: Insufficient documentation

## 2019-01-13 DIAGNOSIS — I110 Hypertensive heart disease with heart failure: Secondary | ICD-10-CM | POA: Insufficient documentation

## 2019-01-21 ENCOUNTER — Other Ambulatory Visit: Payer: Self-pay | Admitting: Psychiatry

## 2019-01-21 DIAGNOSIS — F330 Major depressive disorder, recurrent, mild: Secondary | ICD-10-CM

## 2019-01-21 DIAGNOSIS — F411 Generalized anxiety disorder: Secondary | ICD-10-CM

## 2019-02-09 ENCOUNTER — Other Ambulatory Visit: Payer: Self-pay

## 2019-02-10 ENCOUNTER — Ambulatory Visit: Payer: Medicare Other | Admitting: Gastroenterology

## 2019-02-17 ENCOUNTER — Other Ambulatory Visit: Payer: Self-pay | Admitting: Psychiatry

## 2019-03-03 ENCOUNTER — Ambulatory Visit: Payer: Medicare Other | Admitting: Gastroenterology

## 2019-03-24 ENCOUNTER — Other Ambulatory Visit (HOSPITAL_COMMUNITY): Payer: Self-pay | Admitting: Internal Medicine

## 2019-03-24 ENCOUNTER — Other Ambulatory Visit: Payer: Self-pay | Admitting: Internal Medicine

## 2019-03-24 ENCOUNTER — Other Ambulatory Visit: Payer: Self-pay

## 2019-03-24 ENCOUNTER — Ambulatory Visit
Admission: RE | Admit: 2019-03-24 | Discharge: 2019-03-24 | Disposition: A | Discharge status: Home / Self Care | Payer: Medicare Other | Source: Ambulatory Visit | Attending: Internal Medicine | Admitting: Internal Medicine

## 2019-03-24 ENCOUNTER — Other Ambulatory Visit
Admission: RE | Admit: 2019-03-24 | Discharge: 2019-03-24 | Disposition: A | Discharge status: Home / Self Care | Payer: Medicare Other | Source: Ambulatory Visit | Attending: Internal Medicine | Admitting: Internal Medicine

## 2019-03-24 DIAGNOSIS — R410 Disorientation, unspecified: Secondary | ICD-10-CM

## 2019-03-24 LAB — AMMONIA: Ammonia: 63 umol/L — ABNORMAL HIGH (ref 9–35)

## 2019-04-08 ENCOUNTER — Telehealth: Payer: Self-pay | Admitting: Adult Health Nurse Practitioner

## 2019-04-08 NOTE — Telephone Encounter (Signed)
Spoke with sister, [name].  States a nurse from Dr. Tonette Bihari office is coming by today and would a call back in January. Kalayla Shadden K. Olena Heckle NP

## 2019-05-16 ENCOUNTER — Other Ambulatory Visit: Payer: Self-pay | Admitting: Psychiatry

## 2019-05-16 DIAGNOSIS — F330 Major depressive disorder, recurrent, mild: Secondary | ICD-10-CM

## 2019-05-24 ENCOUNTER — Other Ambulatory Visit: Payer: Self-pay | Admitting: Psychiatry

## 2019-05-24 DIAGNOSIS — F330 Major depressive disorder, recurrent, mild: Secondary | ICD-10-CM

## 2019-06-02 ENCOUNTER — Telehealth: Payer: Self-pay | Admitting: Adult Health Nurse Practitioner

## 2019-06-02 NOTE — Telephone Encounter (Signed)
Spoke with patient's sister [name], and offered to schedule a Palliative f/u visit and she wanted to wait until after patient's MD appointment with Dr. Ouida Sills on 2/23.  I told sister I would call her back on 06/17/19 to f/u and she was in agreement with this.

## 2019-06-14 ENCOUNTER — Ambulatory Visit: Payer: Medicare Other | Admitting: Psychiatry

## 2019-06-15 ENCOUNTER — Encounter: Payer: Self-pay | Admitting: Psychiatry

## 2019-06-15 ENCOUNTER — Ambulatory Visit (INDEPENDENT_AMBULATORY_CARE_PROVIDER_SITE_OTHER): Payer: Medicare Other | Admitting: Psychiatry

## 2019-06-15 ENCOUNTER — Other Ambulatory Visit: Payer: Self-pay

## 2019-06-15 DIAGNOSIS — F411 Generalized anxiety disorder: Secondary | ICD-10-CM | POA: Diagnosis not present

## 2019-06-15 DIAGNOSIS — F330 Major depressive disorder, recurrent, mild: Secondary | ICD-10-CM

## 2019-06-15 DIAGNOSIS — F5105 Insomnia due to other mental disorder: Secondary | ICD-10-CM

## 2019-06-15 MED ORDER — LORAZEPAM 0.5 MG PO TABS: 0.5000 mg | ORAL_TABLET | ORAL | 1 refills | Status: DC

## 2019-06-15 NOTE — Progress Notes (Signed)
Provider Location : [facility] Patient Location : Home  Virtual Visit via Video Note  I connected with Cassidy [name] on 06/15/19 at  1:00 PM EST by a video enabled telemedicine application and verified that I am speaking with the correct person using two identifiers.   I discussed the limitations of evaluation and management by telemedicine and the availability of in person appointments. The patient expressed understanding and agreed to proceed.   I discussed the assessment and treatment plan with the patient. The patient was provided an opportunity to ask questions and all were answered. The patient agreed with the plan and demonstrated an understanding of the instructions.   The patient was advised to call back or seek an in-person evaluation if the symptoms worsen or if the condition fails to improve as anticipated.  [place] MD OP Progress Note  06/15/2019 6:08 PM Cassidy [name]  MRN:  [id number]  Chief Complaint:  Chief Complaint    Follow-up     HPI: Cassidy [name] is a 79 year old Caucasian female who has a history of MDD, GAD, panic attacks, insomnia, thrombocytopenia, hypothyroidism, chronic liver disease with portal hypertension was evaluated by telemedicine today.  Patient's Sister [name] provided collateral information.  Patient today reports she is currently struggling with anxiety symptoms on and off.  She appeared to be alert was oriented to person date and situation.  Per [name] patient is not interested in physical therapy sessions or taking walks and so on.  She wants to just sit down wherever she is and seems to be anxious about doing things.  However [name] does not believe she needs more medication changes however would like to have something as needed.  She is also interested in possible resources in the community for the patient since she wants to keep her at home and not send her to a nursing home.  Patient denies any suicidality, homicidality or perceptual disturbances. Visit  Diagnosis:    ICD-10-CM   1. MDD (major depressive disorder), recurrent episode, mild ([place])  F33.0   2. GAD (generalized anxiety disorder)  F41.1 LORazepam (ATIVAN) 0.5 MG tablet  3. Insomnia due to mental disorder  F51.05     Past Psychiatric History: I have reviewed past psychiatric history from my progress note on 06/23/2017.  Past Medical History:  Past Medical History:  Diagnosis Date  . Anxiety   . Depression   . Fatty liver   . GERD (gastroesophageal reflux disease)   . Hypothyroidism   . Obesity   . Thyroid disease     Past Surgical History:  Procedure Laterality Date  . APPENDECTOMY    . DILATATION & CURETTAGE/HYSTEROSCOPY WITH MYOSURE N/A 02/04/2018   Procedure: DILATATION & CURETTAGE/HYSTEROSCOPY WITH MYOSURE;  Surgeon: Ward, Honor Loh, MD;  Location: [facility] ORS;  Service: Gynecology;  Laterality: N/A;  . ESOPHAGOGASTRODUODENOSCOPY (EGD) WITH PROPOFOL N/A 12/20/2014   Procedure: ESOPHAGOGASTRODUODENOSCOPY (EGD) WITH PROPOFOL;  Surgeon: Manya Silvas, MD;  Location: [facility] ENDOSCOPY;  Service: Endoscopy;  Laterality: N/A;  . SAVORY DILATION N/A 12/20/2014   Procedure: SAVORY DILATION;  Surgeon: Manya Silvas, MD;  Location: [facility] ENDOSCOPY;  Service: Endoscopy;  Laterality: N/A;  . TONSILLECTOMY      Family Psychiatric History: Reviewed family psychiatric history from my progress note on 06/23/2017.  Family History:  Family History  Problem Relation Age of Onset  . Diabetes Mother   . COPD Mother   . Kidney disease Mother   . Depression Mother   . Hypertension Mother   .  Heart attack Father   . Aneurysm Father   . Anxiety disorder Sister   . Depression Sister   . Diabetes Sister   . Hypertension Sister   . Atrial fibrillation Brother   . Spinal muscular atrophy Brother   . Breast cancer Sister   . Bone cancer Sister   . Depression Sister   . Anxiety disorder Sister   . Liver cancer Sister   . Breast cancer Sister   . Colon cancer Sister   . Depression  Sister   . Diabetes Sister   . COPD Sister   . Depression Sister   . Anxiety disorder Sister   . Skin cancer Sister   . Diabetes Brother   . Depression Brother   . Skin cancer Maternal Aunt   . Diabetes Maternal Aunt   . Cervical cancer Paternal Aunt     Social History: Reviewed social history from my progress note on 06/23/2017. Social History   Socioeconomic History  . Marital status: Widowed    Spouse name: Not on file  . Number of children: 3  . Years of education: Not on file  . Highest education level: Not on file  Occupational History  . Not on file  Tobacco Use  . Smoking status: Never Smoker  . Smokeless tobacco: Never Used  Substance and Sexual Activity  . Alcohol use: No  . Drug use: No  . Sexual activity: Not Currently    Birth control/protection: Post-menopausal  Other Topics Concern  . Not on file  Social History Narrative  . Not on file   Social Determinants of Health   Financial Resource Strain:   . Difficulty of Paying Living Expenses: Not on file  Food Insecurity:   . Worried About Charity fundraiser in the Last Year: Not on file  . Ran Out of Food in the Last Year: Not on file  Transportation Needs:   . Lack of Transportation (Medical): Not on file  . Lack of Transportation (Non-Medical): Not on file  Physical Activity:   . Days of Exercise per Week: Not on file  . Minutes of Exercise per Session: Not on file  Stress:   . Feeling of Stress : Not on file  Social Connections:   . Frequency of Communication with Friends and Family: Not on file  . Frequency of Social Gatherings with Friends and Family: Not on file  . Attends Religious Services: Not on file  . Active Member of Clubs or Organizations: Not on file  . Attends Archivist Meetings: Not on file  . Marital Status: Not on file    Allergies:  Allergies  Allergen Reactions  . Sulfa Antibiotics Shortness Of Breath    Metabolic Disorder Labs: Lab Results  Component Value  Date   HGBA1C 5.3 06/21/2016   MPG 105 06/21/2016   No results found for: PROLACTIN Lab Results  Component Value Date   CHOL 145 06/21/2016   TRIG 51 06/21/2016   HDL 44 06/21/2016   CHOLHDL 3.3 06/21/2016   VLDL 10 06/21/2016   LDLCALC 91 06/21/2016   LDLCALC 70 08/22/2013   Lab Results  Component Value Date   TSH 1.545 02/18/2018   TSH 1.683 12/18/2017    Therapeutic Level Labs: No results found for: LITHIUM No results found for: VALPROATE No components found for:  CBMZ  Current Medications: Current Outpatient Medications  Medication Sig Dispense Refill  . lactulose (CHRONULAC) 10 GM/15ML solution Take by mouth.    Marland Kitchen NIFEdipine (  PROCARDIA) 10 MG capsule Take by mouth.    . ondansetron (ZOFRAN) 8 MG tablet Take by mouth.    . oxybutynin (DITROPAN) 5 MG tablet Take by mouth.    . busPIRone (BUSPAR) 15 MG tablet TAKE ONE TABLET BY MOUTH EVERY EVENING 90 tablet 1  . escitalopram (LEXAPRO) 20 MG tablet TAKE ONE TABLET BY MOUTH EVERY DAY 90 tablet 1  . fluticasone (FLONASE) 50 MCG/ACT nasal spray TAKE 2 SPRAYS INTO BOTH NOSTRILS TWICE ADAY    . furosemide (LASIX) 40 MG tablet Take 1 tablet (40 mg total) by mouth daily. 30 tablet 0  . lactulose (CHRONULAC) 10 GM/15ML solution     . levothyroxine (SYNTHROID) 50 MCG tablet Take 50 mcg by mouth every morning.    Marland Kitchen levothyroxine (SYNTHROID) 75 MCG tablet Take 75 mcg by mouth every morning.    Marland Kitchen levothyroxine (SYNTHROID, LEVOTHROID) 100 MCG tablet Take 1 tablet (100 mcg total) by mouth daily before breakfast. 30 tablet 0  . LORazepam (ATIVAN) 0.5 MG tablet Take 1 tablet (0.5 mg total) by mouth as directed. Take 1 tablet once a day as needed up to 2 times a week only for severe anxiety 10 tablet 1  . omeprazole (PRILOSEC) 40 MG capsule Take 1 capsule (40 mg total) by mouth daily. 30 capsule 1  . ondansetron (ZOFRAN) 8 MG tablet     . ondansetron (ZOFRAN-ODT) 8 MG disintegrating tablet Take 8 mg by mouth every 8 (eight) hours as  needed for nausea or vomiting.    Marland Kitchen QUEtiapine (SEROQUEL) 100 MG tablet TAKE 1 TABLET BY MOUTH NIGHTLY 90 tablet 1  . spironolactone (ALDACTONE) 100 MG tablet Take 1 tablet (100 mg total) by mouth daily. 30 tablet 0  . traZODone (DESYREL) 150 MG tablet TAKE ONE TABLET BY MOUTH AT BEDTIME 90 tablet 2  . traZODone (DESYREL) 50 MG tablet Take 1 tablet (50 mg total) by mouth at bedtime as needed for sleep. To be combined with 150 mg as needed 90 tablet 1  . XIFAXAN 550 MG TABS tablet Take 1 tablet by mouth 2 (two) times daily.     No current facility-administered medications for this visit.     Musculoskeletal: Strength & Muscle Tone: UTA Gait & Station: Observed as seated Patient leans: N/A  Psychiatric Specialty Exam: Review of Systems  Psychiatric/Behavioral: The patient is nervous/anxious.   All other systems reviewed and are negative.   There were no vitals taken for this visit.There is no height or weight on file to calculate BMI.  General Appearance: Casual  Eye Contact:  Fair  Speech:  Clear and Coherent  Volume:  Normal  Mood:  Anxious  Affect:  Congruent  Thought Process:  Goal Directed and Descriptions of Associations: Intact  Orientation:  Other:  person, date , situation  Thought Content: Logical   Suicidal Thoughts:  No  Homicidal Thoughts:  No  Memory:  Immediate;   Fair Recent;   Limited Remote;   Limited  Judgement:  Fair  Insight:  Fair  Psychomotor Activity:  Normal  Concentration:  Concentration: Fair and Attention Span: Fair  Recall:  [employer] of Knowledge: Fair  Language: Fair  Akathisia:  No  Handed:  Right  AIMS (if indicated): UTA  Assets:  Communication Skills Housing Social Support  ADL's:  Intact  Cognition: baseline  Sleep:  restless   Screenings: AUDIT     Admission (Discharged) from 06/20/2016 in [facility]  Alcohol Use Disorder Identification Test Final  Score (AUDIT)  0    ECT-MADRS     ECT Treatment from  07/11/2016 in [facility] Admission (Discharged) from 06/20/2016 in [facility] Total Score  16  16    Mini-Mental     ECT Treatment from 07/11/2016 in [facility] Admission (Discharged) from 06/20/2016 in [facility]  Total Score (max 30 points )  30  28       Assessment and Plan: Cassidy [name] is a 79 year old Caucasian female who has a history of depression, anxiety, insomnia, recent thrombocytopenia, chronic liver disease with portal hypertension, hypothyroidism was evaluated by telemedicine today.  Patient continues to struggle with anxiety symptoms and will benefit from medications as well as possible community resources.  Plan as noted below.  Plan MDD- improving Lexapro 20 mg p.o. daily Seroquel 100 mg p.o. nightly  Anxiety-unstable BuSpar 15 mg p.o. daily  Start Ativan 0.5 mg as needed up to 1-2 times a week for severe anxiety attacks Refer for psychotherapy sessions  Insomnia-restless Discussed sleep hygiene. Trazodone 150 mg p.o. nightly. Patient also has trazodone 50 mg as needed for severe insomnia. Patient also has Seroquel available.  Provided information for [facility] program-intake [phone].  I have referred patient for psychotherapy sessions-sent referral to coordinator-Tam[place].  Collateral information was obtained from [facility].  Follow-up in clinic in 3 months or sooner if needed.  May 20 at 2 PM  I have spent atleast 20 minutes non face to face with patient today. More than 50 % of the time was spent for preparing to see the patient ( e.g., review of test, records ), obtaining and to review and separately obtained history , ordering medications and test ,psychoeducation and supportive psychotherapy and care coordination,as well as documenting clinical information in electronic health record. This note was generated in part or whole with voice  recognition software. Voice recognition is usually quite accurate but there are transcription errors that can and very often do occur. I apologize for any typographical errors that were not detected and corrected.         Ursula Alert, MD 06/15/2019, 6:08 PM

## 2019-06-20 ENCOUNTER — Other Ambulatory Visit: Payer: Medicare Other | Admitting: Adult Health Nurse Practitioner

## 2019-06-20 ENCOUNTER — Telehealth: Payer: Self-pay | Admitting: Adult Health Nurse Practitioner

## 2019-06-20 ENCOUNTER — Other Ambulatory Visit: Payer: Self-pay

## 2019-06-20 NOTE — Telephone Encounter (Signed)
Called sister's cell phone for scheduled telephone visit at 3:30pm.  No one answered phone and unable to leave VM due to VM box full.  Put on list for rescheduling. Steven Basso K. Olena Heckle NP

## 2019-06-20 NOTE — Telephone Encounter (Signed)
Spoke with patient's sister, [name] about scheduling a Palliative f/u visit, she has agreed to a Telephone f/u visit, this was scheduled for 06/20/19 @ 3:30 PM.

## 2019-06-23 ENCOUNTER — Other Ambulatory Visit: Payer: Medicare Other | Admitting: Adult Health Nurse Practitioner

## 2019-06-23 ENCOUNTER — Other Ambulatory Visit: Payer: Self-pay

## 2019-06-23 DIAGNOSIS — Z515 Encounter for palliative care: Secondary | ICD-10-CM

## 2019-06-23 DIAGNOSIS — F0390 Unspecified dementia without behavioral disturbance: Secondary | ICD-10-CM

## 2019-06-23 NOTE — Progress Notes (Addendum)
[facility] Consult Note Telephone: [phone]  Fax: [phone]  PATIENT NAME: Cassidy [name] DOB: 05-08-1940 MRN: [id number]  PRIMARY CARE PROVIDER:   Kirk Ruths, MD  REFERRING PROVIDER:  Kirk Ruths, MD [name] [place],  [place] [address]  RESPONSIBLE PARTY:   Cassidy [name], [phone]  Due to the COVID-19 crisis, this visit was done via telemedicine and it was initiated and consent by this patient and or family. Video-audio (telehealth) contact was unable to be done due to technical barriers from the patient's side.    RECOMMENDATIONS and PLAN:  1.  Advanced care planning.  Patient full code.  Sister states that she is healthcare and financial POA.  Has advanced directives but is unsure where they are right now.  Has agreed to have blank MOST sent to her for review.  Will discuss further at a future visit.  2.  Dementia.  FAST 6b. Patient ambulates with stand by assist.  Sister states that she is having to remind her to pick up her feet more often with walking or else she will shuffle her feet.  She requires assistance with ADLs.Is able to feed herself.  Sister reports that she is eating well.  No reported weight loss.  Patient is able to have a conversation.  She is becoming increasingly forgetful and repeats herself. Having more episodes in which she thinks something happened to her but it had actually happened to someone else. She will have some days with sundowning and a night every now and then that she doesn't sleep well, but this does not happen often. She is currently on buspar 15 mg BID, lexapro 20 mg daily, seroquel 100 mg QHS, trazodone 150 mg QHS, and clonazepam 0.5 mg daily PRN.  Continue current supportive care.  Sister encouraged to call with concerns  3.  NASH/cirrhosis of the liver.  Being followed by Dr. Marius [name] with GI.  Patient is taking Xifaxan 559m BID,  lactulose 167m (10g) BID, and lasix 40 mg BID.  Sister takes her weights daily and weight has been stable.  States that over the past year she has had to give extra lasix for increased edema.  Denies increased SOB or cough, edema, N/V/D, constipation.  Continue follow up and recommendations by GI  4.  Support. Patient lives with her sister. Primary caregiver is the sister.  Cassidy [name] take her back to her home when he has days off.  Sister has had a lot of loss and sickness in her family over the past year and is feeling tired all the time.  States that they are doing fine and not sure about bringing help into the home.  She does seem overwhelmed and could use support at home.  Will put in SW referral for resources in the home.  Patient has not had any falls, infections, hospital visits in past year. Palliative will continue to monitor for symptom management/decline and make recommendations as needed.  Will call back in 4 weeks to see if sister would like to schedule a follow up visit.  I spent 90 minutes providing this consultation,  from 9:30 to 11:00. More than 50% of the time in this consultation was spent coordinating communication.   HISTORY OF PRESENT ILLNESS:  Cassidy [name] a [age].o. year old female with multiple medical problems including  Dementia, NASH, cirrhosis of liver, portal HTN,GERD, hypothyroidism, chronic thrombocytopenia. Palliative Care was asked to  help address goals of care.   CODE STATUS: full code  PPS: 60% HOSPICE ELIGIBILITY/DIAGNOSIS: TBD  PHYSICAL EXAM:   Deferred   PAST MEDICAL HISTORY:  Past Medical History:  Diagnosis Date  . Anxiety   . Depression   . Fatty liver   . GERD (gastroesophageal reflux disease)   . Hypothyroidism   . Obesity   . Thyroid disease     SOCIAL HX:  Social History   Tobacco Use  . Smoking status: Never Smoker  . Smokeless tobacco: Never Used  Substance Use Topics  . Alcohol use: No    ALLERGIES:  Allergies   Allergen Reactions  . Sulfa Antibiotics Shortness Of Breath     PERTINENT MEDICATIONS:  Outpatient Encounter Medications as of 06/23/2019  Medication Sig  . busPIRone (BUSPAR) 15 MG tablet TAKE ONE TABLET BY MOUTH EVERY EVENING  . escitalopram (LEXAPRO) 20 MG tablet TAKE ONE TABLET BY MOUTH EVERY DAY  . fluticasone (FLONASE) 50 MCG/ACT nasal spray TAKE 2 SPRAYS INTO BOTH NOSTRILS TWICE ADAY  . furosemide (LASIX) 40 MG tablet Take 1 tablet (40 mg total) by mouth daily.  Marland Kitchen lactulose (CHRONULAC) 10 GM/15ML solution   . lactulose (CHRONULAC) 10 GM/15ML solution Take by mouth.  . levothyroxine (SYNTHROID) 50 MCG tablet Take 50 mcg by mouth every morning.  Marland Kitchen levothyroxine (SYNTHROID) 75 MCG tablet Take 75 mcg by mouth every morning.  Marland Kitchen levothyroxine (SYNTHROID, LEVOTHROID) 100 MCG tablet Take 1 tablet (100 mcg total) by mouth daily before breakfast.  . LORazepam (ATIVAN) 0.5 MG tablet Take 1 tablet (0.5 mg total) by mouth as directed. Take 1 tablet once a day as needed up to 2 times a week only for severe anxiety  . NIFEdipine (PROCARDIA) 10 MG capsule Take by mouth.  Marland Kitchen omeprazole (PRILOSEC) 40 MG capsule Take 1 capsule (40 mg total) by mouth daily.  . ondansetron (ZOFRAN) 8 MG tablet   . ondansetron (ZOFRAN) 8 MG tablet Take by mouth.  . ondansetron (ZOFRAN-ODT) 8 MG disintegrating tablet Take 8 mg by mouth every 8 (eight) hours as needed for nausea or vomiting.  Marland Kitchen oxybutynin (DITROPAN) 5 MG tablet Take by mouth.  . QUEtiapine (SEROQUEL) 100 MG tablet TAKE 1 TABLET BY MOUTH NIGHTLY  . spironolactone (ALDACTONE) 100 MG tablet Take 1 tablet (100 mg total) by mouth daily.  . traZODone (DESYREL) 150 MG tablet TAKE ONE TABLET BY MOUTH AT BEDTIME  . traZODone (DESYREL) 50 MG tablet Take 1 tablet (50 mg total) by mouth at bedtime as needed for sleep. To be combined with 150 mg as needed  . XIFAXAN 550 MG TABS tablet Take 1 tablet by mouth 2 (two) times daily.   No facility-administered encounter  medications on file as of 06/23/2019.     Madolin Twaddle Jenetta Downer, NP

## 2019-07-04 ENCOUNTER — Telehealth: Payer: Self-pay

## 2019-07-04 NOTE — Telephone Encounter (Signed)
SW received referral from Amy, NP regarding in-home care resource information.  SW contacted Cassidy [name] (patient's sister) to discuss. Cassidy [name] provided brief overview of care situation. Cassidy [name] said she is caring for patient most of the time. Patient's grandson does help with patient's care and will give Cassidy [name] a break. Cassidy [name] said her goal is to keep patient at home for as long as possible. Cassidy [name] said she is patient's HCPOA. Patient is becoming more physically dependent on family for care. Discussed loss of patient and [name]'s brother in October, and the challenges that this brings. SW and [place] processed this loss. Discussed the challenges of patient's dementia and dementia care. Discussed options for in-home care and day programs. Cassidy [name] is going to consider Medicaid application, said she has a copy of the forms with her. SW provided education on resources, discussed care and goals and used active and reflective listening.

## 2019-07-20 ENCOUNTER — Telehealth: Payer: Self-pay | Admitting: Adult Health Nurse Practitioner

## 2019-07-20 NOTE — Telephone Encounter (Signed)
Called sister to offer visit.  Scheduled visit for 07/25/19 at 4:30 pm.  States visit will be at patient's condo and her grandson, [name], will be with her.  Will call sister afterwards with update. Kaysie Michelini K. Olena Heckle NP

## 2019-07-21 ENCOUNTER — Other Ambulatory Visit: Payer: Self-pay | Admitting: Psychiatry

## 2019-07-21 DIAGNOSIS — F330 Major depressive disorder, recurrent, mild: Secondary | ICD-10-CM

## 2019-07-21 DIAGNOSIS — F411 Generalized anxiety disorder: Secondary | ICD-10-CM

## 2019-07-21 IMAGING — CT CT HEAD W/O CM
3 series · 15 of 45 positions shown, 18 images · non-contrast
Comparison: CT brain 08/21/2013

CLINICAL DATA: Ataxia, post head trauma

EXAM:
CT HEAD WITHOUT CONTRAST
TECHNIQUE: Contiguous axial images were obtained from the base of the skull
through the vertex without intravenous contrast.

[Series 3: head wo · axial · 0.40mm/px · z∈[-72,+43]mm · 9 of 28 slices shown, 12 images]
[im 3/28  brain]
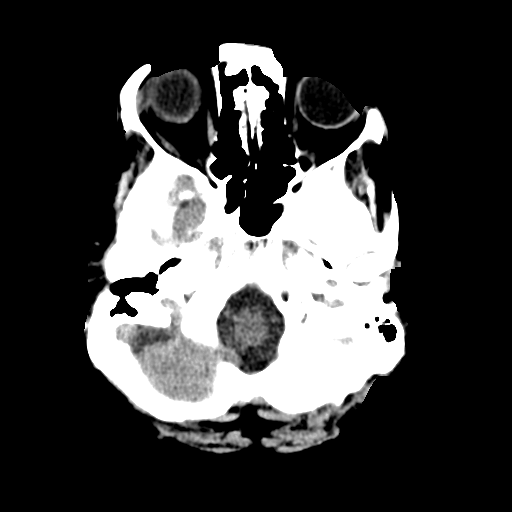
[im 3/28  bone]
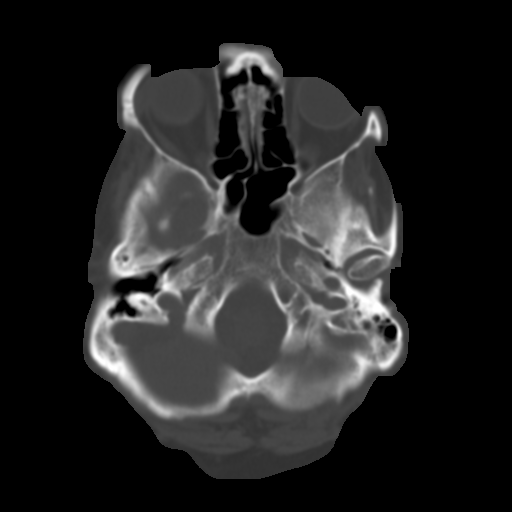
[im 6/28  brain]
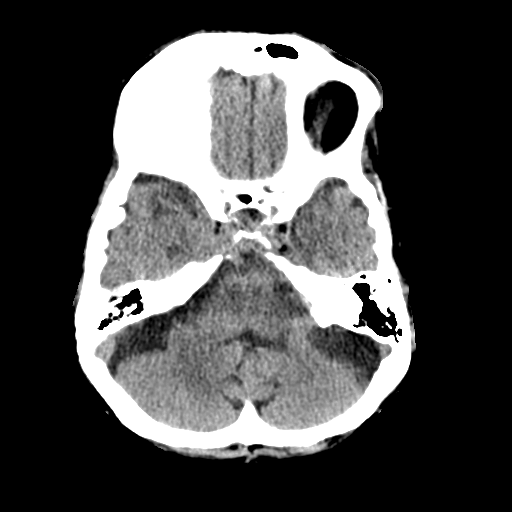
[im 9/28  brain]
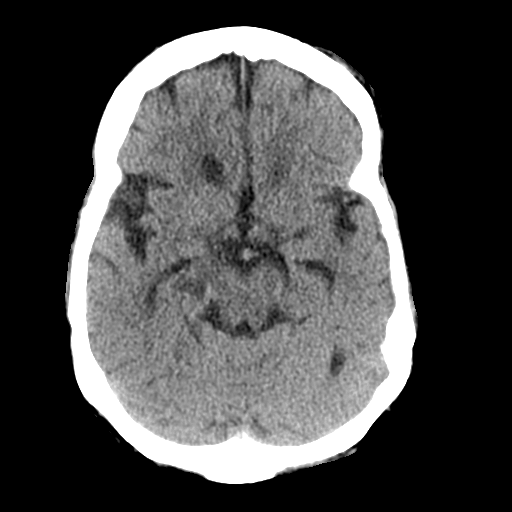
[im 12/28  brain]
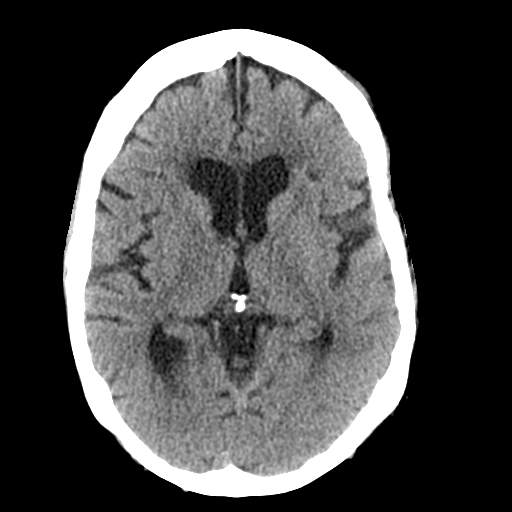
[im 15/28  brain]
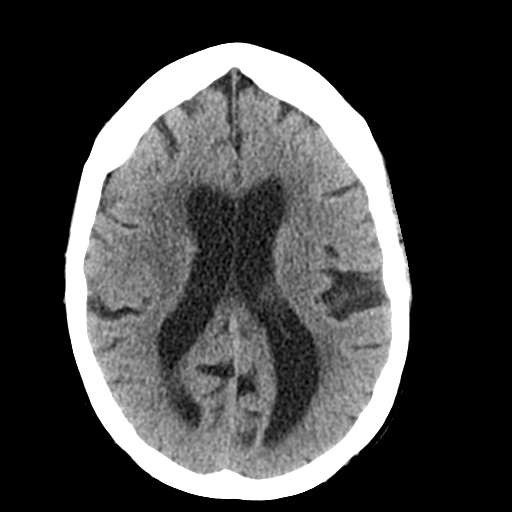
[im 15/28  bone]
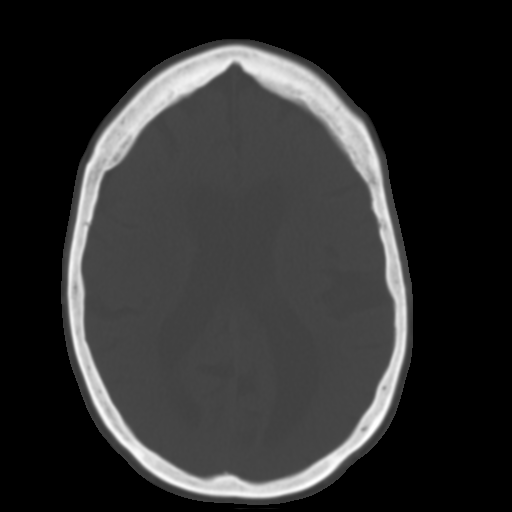
[im 17/28  brain]
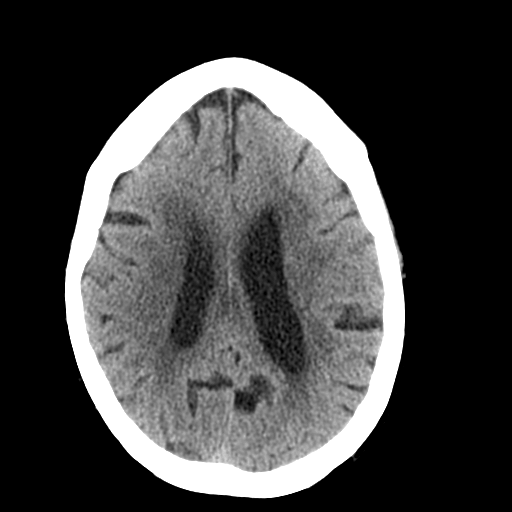
[im 20/28  brain]
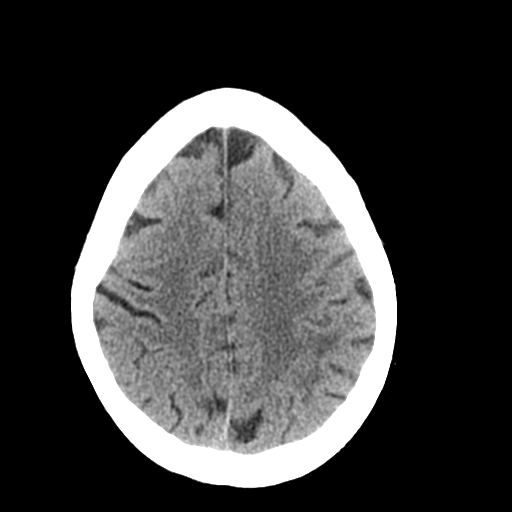
[im 23/28  brain]
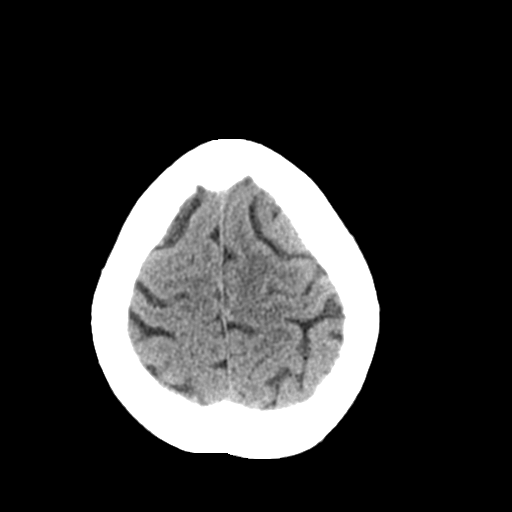
[im 26/28  brain]
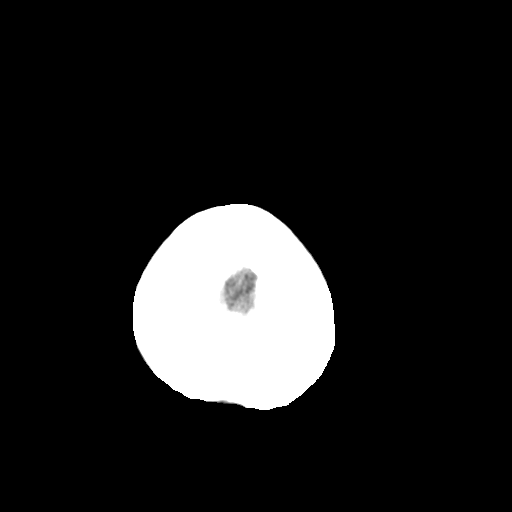
[im 26/28  bone]
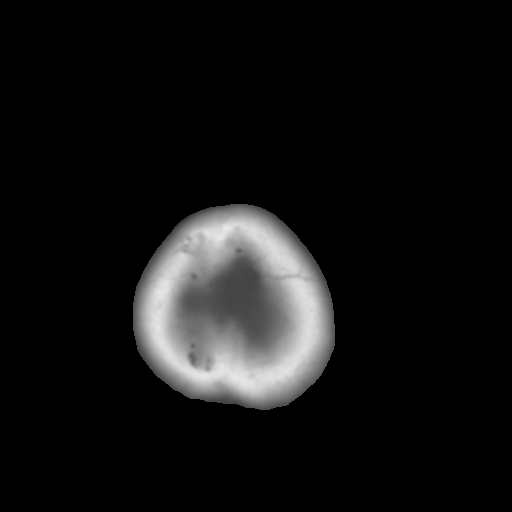

[Series 4: coronal soft tissue · coronal · 0.30mm/px · 3 of 62 slices shown]
[im 21/62  brain]
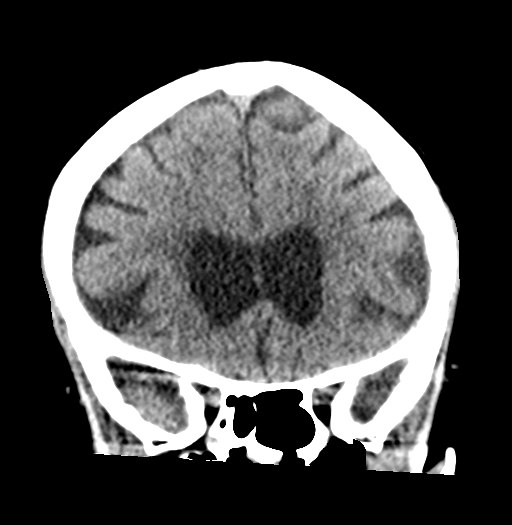
[im 28/62  brain]
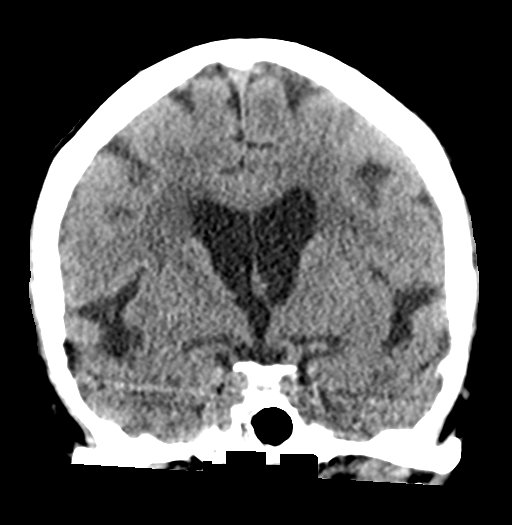
[im 34/62  brain]
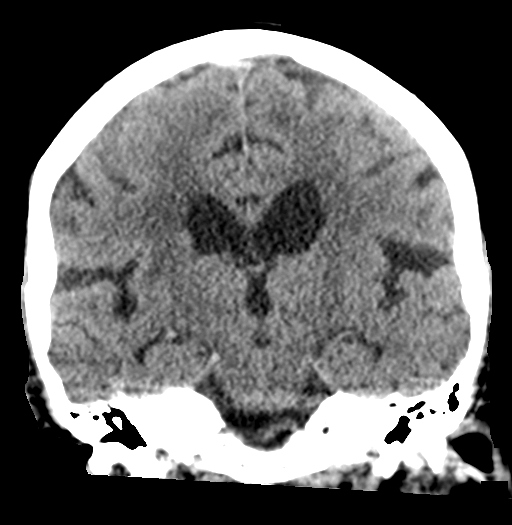

[Series 5: sagittal soft tissue · sagittal · 0.28mm/px · 3 of 48 slices shown]
[im 16/48  brain]
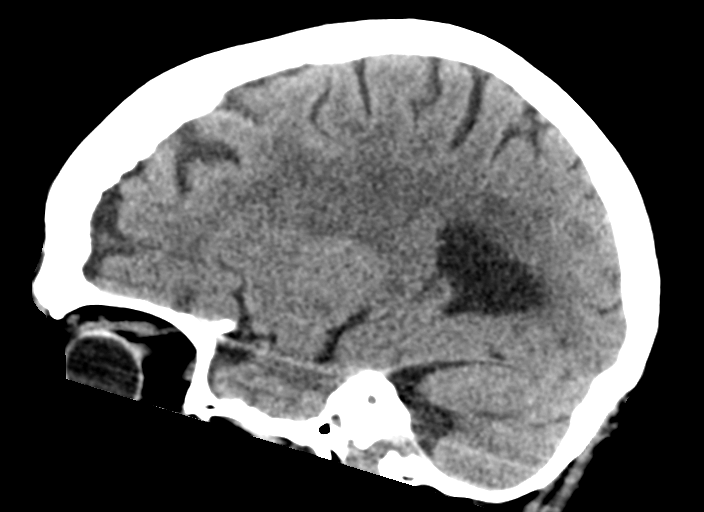
[im 24/48  brain]
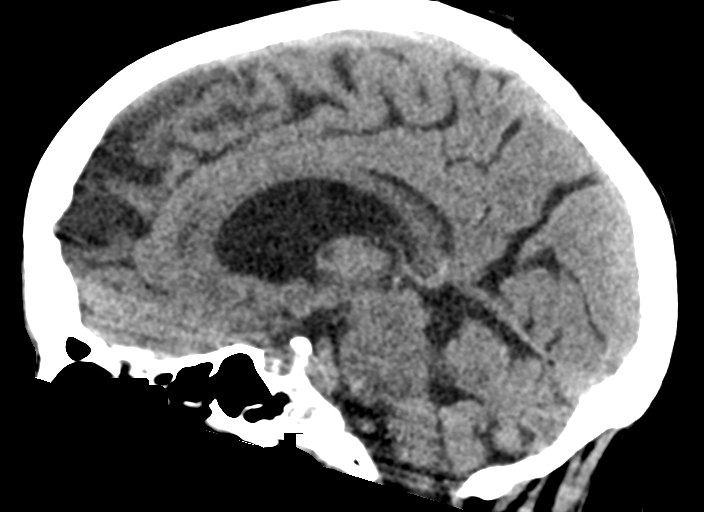
[im 32/48  brain]
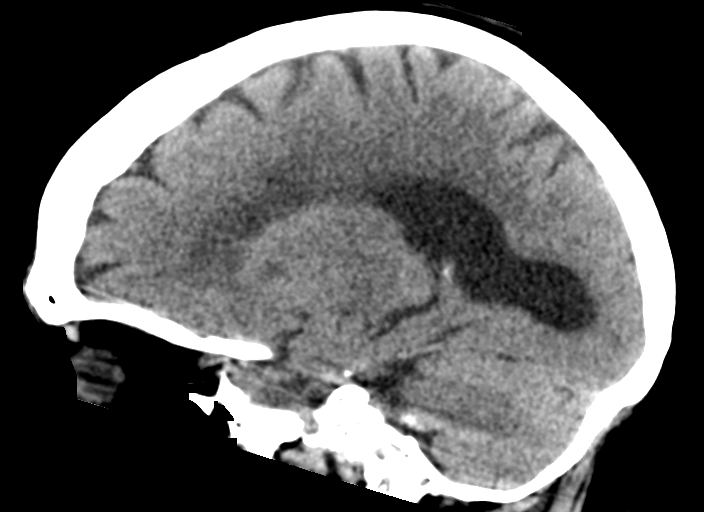

[15 of 45 positions shown; findings below may reference images not displayed]

FINDINGS: Brain: No acute territorial infarction, hemorrhage or intracranial
mass is visualized. Atrophy with mild small vessel ischemic changes
of the white matter. Stable mildly prominent ventricle size.

Vascular: No hyperdense vessels. Scattered calcifications at the
carotid siphons.

Skull: No depressed skull fracture.

Sinuses/Orbits: Mild mucosal thickening in the ethmoid sinuses. No
acute orbital abnormality.

Other: Mild left supraorbital and forehead soft tissue swelling
IMPRESSION: 1. No CT evidence for acute intracranial abnormality
2. Atrophy and mild small vessel ischemic changes of the white
matter

## 2019-07-25 ENCOUNTER — Other Ambulatory Visit: Payer: Self-pay

## 2019-07-25 ENCOUNTER — Other Ambulatory Visit: Payer: Medicare Other | Admitting: Adult Health Nurse Practitioner

## 2019-07-25 DIAGNOSIS — F0390 Unspecified dementia without behavioral disturbance: Secondary | ICD-10-CM

## 2019-07-25 DIAGNOSIS — Z515 Encounter for palliative care: Secondary | ICD-10-CM

## 2019-07-25 NOTE — Progress Notes (Signed)
[facility] Consult Note Telephone: [phone]  Fax: [phone]  PATIENT NAME: Cassidy [name] DOB: [birth year] MRN: [id number]  PRIMARY CARE PROVIDER:   Kirk Ruths, MD  REFERRING PROVIDER:  Kirk Ruths, MD [facility] [place],  [place] [address]  RESPONSIBLE PARTY:   [name], sister [phone]    RECOMMENDATIONS and PLAN:  1.  Advanced care planning.  Patient is a full code.  Grandson with patient today and preferred that discussion be with the patient's sister and not him.  Will schedule next appointment when the sister can be with the patient to have discussion on ACP.  2.  Dementia.  FAST 6b.  Patient ambulates with stand by assist.  Sister states that she is having to remind her to pick up her feet more often with walking or else she will shuffle her feet.  She requires assistance with ADLs.Is able to feed herself.  Grandson reports that she is eating well.  No reported weight loss. She will have some days with sundowning and a night every now and then that she doesn't sleep well, but this does not happen often. She is currently on buspar 15 mg BID, lexapro 20 mg daily, seroquel 100 mg QHS, trazodone 150 mg QHS, and clonazepam 0.5 mg daily PRN.  Continue current supportive care at home.  3.  NASH/cirrhosis of the liver.  Being followed by Dr. Marius Ditch with GI.  Patient is taking Xifaxan 543m BID, lactulose 151m (10g) BID, and lasix 40 mg BID.  Denies increased SOB or cough, edema, N/V/D, constipation.  Continue follow up and recommendations by GI  Patient has not had any falls, infections, hospital visits since last visit. Palliative will continue to monitor for symptom management/decline and make recommendations as needed.  Will call back in 4 weeks to see if sister would like to schedule a follow up visit.  I spent 50 minutes providing this consultation,  from 4:30 to 5:20 including  time with patient/family, chart review, provider coordination, documentation. More than 50% of the time in this consultation was spent coordinating communication.   HISTORY OF PRESENT ILLNESS:  JoTAJAH NOGUCHIs a [age].o. year old female with multiple medical problems including Dementia, NASH,cirrhosis of liver, portal HTN,GERD, hypothyroidism, chronic thrombocytopenia. Palliative Care was asked to help address goals of care.   CODE STATUS: full code  PPS: 60% HOSPICE ELIGIBILITY/DIAGNOSIS: TBD  PHYSICAL EXAM:  BP  132/70  HR  91  O2 97% on RA General: NAD, frail appearing Cardiovascular: regular rate and rhythm Pulmonary: lung sounds clear; normal respiratory effort Abdomen: soft, nontender, + bowel sounds GU: no suprapubic tenderness Extremities: no edema, no joint deformities Skin: no rashes on exposed skin Neurological: Weakness; A&O to person and place  PAST MEDICAL HISTORY:  Past Medical History:  Diagnosis Date  . Anxiety   . Depression   . Fatty liver   . GERD (gastroesophageal reflux disease)   . Hypothyroidism   . Obesity   . Thyroid disease     SOCIAL HX:  Social History   Tobacco Use  . Smoking status: Never Smoker  . Smokeless tobacco: Never Used  Substance Use Topics  . Alcohol use: No    ALLERGIES:  Allergies  Allergen Reactions  . Sulfa Antibiotics Shortness Of Breath     PERTINENT MEDICATIONS:  Outpatient Encounter Medications as of 07/25/2019  Medication Sig  . busPIRone (BUSPAR) 15 MG tablet TAKE  ONE TABLET BY MOUTH EVERY EVENING  . escitalopram (LEXAPRO) 20 MG tablet TAKE ONE TABLET BY MOUTH EVERY DAY  . fluticasone (FLONASE) 50 MCG/ACT nasal spray TAKE 2 SPRAYS INTO BOTH NOSTRILS TWICE ADAY  . furosemide (LASIX) 40 MG tablet Take 1 tablet (40 mg total) by mouth daily.  Marland Kitchen lactulose (CHRONULAC) 10 GM/15ML solution   . lactulose (CHRONULAC) 10 GM/15ML solution Take by mouth.  . levothyroxine (SYNTHROID) 50 MCG tablet Take 50 mcg by mouth every  morning.  Marland Kitchen levothyroxine (SYNTHROID) 75 MCG tablet Take 75 mcg by mouth every morning.  Marland Kitchen levothyroxine (SYNTHROID, LEVOTHROID) 100 MCG tablet Take 1 tablet (100 mcg total) by mouth daily before breakfast.  . LORazepam (ATIVAN) 0.5 MG tablet Take 1 tablet (0.5 mg total) by mouth as directed. Take 1 tablet once a day as needed up to 2 times a week only for severe anxiety  . NIFEdipine (PROCARDIA) 10 MG capsule Take by mouth.  Marland Kitchen omeprazole (PRILOSEC) 40 MG capsule Take 1 capsule (40 mg total) by mouth daily.  . ondansetron (ZOFRAN) 8 MG tablet   . ondansetron (ZOFRAN-ODT) 8 MG disintegrating tablet Take 8 mg by mouth every 8 (eight) hours as needed for nausea or vomiting.  Marland Kitchen oxybutynin (DITROPAN) 5 MG tablet Take by mouth.  . QUEtiapine (SEROQUEL) 100 MG tablet TAKE 1 TABLET BY MOUTH NIGHTLY  . spironolactone (ALDACTONE) 100 MG tablet Take 1 tablet (100 mg total) by mouth daily.  . traZODone (DESYREL) 150 MG tablet TAKE ONE TABLET BY MOUTH AT BEDTIME  . traZODone (DESYREL) 50 MG tablet Take 1 tablet (50 mg total) by mouth at bedtime as needed for sleep. To be combined with 150 mg as needed  . XIFAXAN 550 MG TABS tablet Take 1 tablet by mouth 2 (two) times daily.   No facility-administered encounter medications on file as of 07/25/2019.      Meggen Spaziani Jenetta Downer, NP

## 2019-08-23 ENCOUNTER — Other Ambulatory Visit: Payer: Self-pay | Admitting: Psychiatry

## 2019-09-07 ENCOUNTER — Emergency Department
Admission: EM | Admit: 2019-09-07 | Discharge: 2019-09-07 | Disposition: A | Discharge status: Home / Self Care | Payer: Medicare Other | Attending: Emergency Medicine | Admitting: Emergency Medicine

## 2019-09-07 ENCOUNTER — Other Ambulatory Visit: Payer: Self-pay

## 2019-09-07 DIAGNOSIS — Z5321 Procedure and treatment not carried out due to patient leaving prior to being seen by health care provider: Secondary | ICD-10-CM | POA: Diagnosis not present

## 2019-09-07 DIAGNOSIS — R1110 Vomiting, unspecified: Secondary | ICD-10-CM | POA: Insufficient documentation

## 2019-09-07 LAB — CBC
HCT: 34 % — ABNORMAL LOW (ref 36.0–46.0)
Hemoglobin: 11 g/dL — ABNORMAL LOW (ref 12.0–15.0)
MCH: 31.7 pg (ref 26.0–34.0)
MCHC: 32.4 g/dL (ref 30.0–36.0)
MCV: 98 fL (ref 80.0–100.0)
Platelets: 73 10*3/uL — ABNORMAL LOW (ref 150–400)
RBC: 3.47 MIL/uL — ABNORMAL LOW (ref 3.87–5.11)
RDW: 16.3 % — ABNORMAL HIGH (ref 11.5–15.5)
WBC: 2.9 10*3/uL — ABNORMAL LOW (ref 4.0–10.5)
nRBC: 0 % (ref 0.0–0.2)

## 2019-09-07 LAB — COMPREHENSIVE METABOLIC PANEL
ALT: 14 U/L (ref 0–44)
AST: 25 U/L (ref 15–41)
Albumin: 3.1 g/dL — ABNORMAL LOW (ref 3.5–5.0)
Alkaline Phosphatase: 72 U/L (ref 38–126)
Anion gap: 6 (ref 5–15)
BUN: 11 mg/dL (ref 8–23)
CO2: 28 mmol/L (ref 22–32)
Calcium: 8.9 mg/dL (ref 8.9–10.3)
Chloride: 102 mmol/L (ref 98–111)
Creatinine, Ser: 1.03 mg/dL — ABNORMAL HIGH (ref 0.44–1.00)
GFR calc Af Amer: >60 mL/min (ref >60)
GFR calc non Af Amer: 52 mL/min — ABNORMAL LOW (ref >60)
Glucose, Bld: 108 mg/dL — ABNORMAL HIGH (ref 70–99)
Potassium: 4.2 mmol/L (ref 3.5–5.1)
Sodium: 136 mmol/L (ref 135–145)
Total Bilirubin: 1.1 mg/dL (ref 0.3–1.2)
Total Protein: 6.5 g/dL (ref 6.5–8.1)

## 2019-09-07 LAB — LIPASE, BLOOD: Lipase: 32 U/L (ref 11–51)

## 2019-09-07 MED ORDER — SODIUM CHLORIDE 0.9% FLUSH: 3.0000 mL | Freq: Once | INTRAVENOUS | Status: DC

## 2019-09-07 NOTE — ED Triage Notes (Signed)
Pt arrives via POV from home accompanied by her sister for c/o vomiting that started yesterday. Per sister patient is vomiting a small amount of clear liquid everytime she eats or drinks anything. Sister described this as a "foaming" coming out of her mouth. PT appears to be in NAD. Denies diarrhea or fever.

## 2019-09-07 NOTE — ED Notes (Signed)
Family updated on wait

## 2019-09-08 ENCOUNTER — Other Ambulatory Visit: Payer: Self-pay

## 2019-09-08 ENCOUNTER — Telehealth (INDEPENDENT_AMBULATORY_CARE_PROVIDER_SITE_OTHER): Payer: Medicare Other | Admitting: Psychiatry

## 2019-09-08 ENCOUNTER — Encounter: Payer: Self-pay | Admitting: Psychiatry

## 2019-09-08 DIAGNOSIS — F5105 Insomnia due to other mental disorder: Secondary | ICD-10-CM

## 2019-09-08 DIAGNOSIS — F0280 Dementia in other diseases classified elsewhere without behavioral disturbance: Secondary | ICD-10-CM | POA: Insufficient documentation

## 2019-09-08 DIAGNOSIS — F411 Generalized anxiety disorder: Secondary | ICD-10-CM | POA: Diagnosis not present

## 2019-09-08 DIAGNOSIS — F3342 Major depressive disorder, recurrent, in full remission: Secondary | ICD-10-CM | POA: Diagnosis not present

## 2019-09-08 MED ORDER — QUETIAPINE FUMARATE 100 MG PO TABS: 100.0000 mg | ORAL_TABLET | Freq: Every day | ORAL | 1 refills | Status: DC

## 2019-09-08 NOTE — Progress Notes (Signed)
Provider Location : [facility] Patient Location : Home  Virtual Visit via Video Note  I connected with Cassidy [name] on 09/08/19 at  2:00 PM EDT by a video enabled telemedicine application and verified that I am speaking with the correct person using two identifiers.   I discussed the limitations of evaluation and management by telemedicine and the availability of in person appointments. The patient expressed understanding and agreed to proceed.  I discussed the assessment and treatment plan with the patient. The patient was provided an opportunity to ask questions and all were answered. The patient agreed with the plan and demonstrated an understanding of the instructions.   The patient was advised to call back or seek an in-person evaluation if the symptoms worsen or if the condition fails to improve as anticipated.   [facility] MD OP Progress Note  09/08/2019 5:26 PM Cassidy [name]  MRN:  [id number]  Chief Complaint:  Chief Complaint    Follow-up     HPI: Cassidy [name] is a 79 year old Caucasian female who has a history of MDD, GAD, panic attacks, insomnia, thrombocytopenia, hypothyroidism, chronic liver disease with portal hypertension, gastroesophageal reflux disease, dementia was evaluated by telemedicine today.  Patient's Sister Cassidy [name] who is the primary caretaker-provided collateral information.  Patient today appeared to be alert oriented to time self and situation.  She was able to respond to questions appropriately in short phrases.  Patient currently denies any significant depression or anxiety.  She reports sleep is good.  She was able to tell the date correctly.  Patient per sister is currently doing okay with regards to her mood.  She however continues to have some days when she wakes up confused.  They do have home health care/palliative care involved who checks in once a month or so.  Per sister patient is sleeping through the night.  She is compliant on medications.  Denies side  effects.    Visit Diagnosis:    ICD-10-CM   1. MDD (major depressive disorder), recurrent, in full remission ([place])  F33.42 QUEtiapine (SEROQUEL) 100 MG tablet  2. GAD (generalized anxiety disorder)  F41.1   3. Insomnia due to mental disorder  F51.05   4. 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 Anxiety   . Depression   . Fatty liver   . GERD (gastroesophageal reflux disease)   . Hypothyroidism   . Obesity   . Thyroid disease     Past Surgical History:  Procedure Laterality Date  . APPENDECTOMY    . DILATATION & CURETTAGE/HYSTEROSCOPY WITH MYOSURE N/A 02/04/2018   Procedure: DILATATION & CURETTAGE/HYSTEROSCOPY WITH MYOSURE;  Surgeon: Ward, Honor Loh, MD;  Location: [facility] ORS;  Service: Gynecology;  Laterality: N/A;  . ESOPHAGOGASTRODUODENOSCOPY (EGD) WITH PROPOFOL N/A 12/20/2014   Procedure: ESOPHAGOGASTRODUODENOSCOPY (EGD) WITH PROPOFOL;  Surgeon: Manya Silvas, MD;  Location: [facility] ENDOSCOPY;  Service: Endoscopy;  Laterality: N/A;  . SAVORY DILATION N/A 12/20/2014   Procedure: SAVORY DILATION;  Surgeon: Manya Silvas, MD;  Location: [facility] ENDOSCOPY;  Service: Endoscopy;  Laterality: N/A;  . TONSILLECTOMY      Family Psychiatric History: I have reviewed family psychiatric history from my progress note on 06/23/2017  Family History:  Family History  Problem Relation Age of Onset  . Diabetes Mother   . COPD  Mother   . Kidney disease Mother   . Depression Mother   . Hypertension Mother   . Heart attack Father   . Aneurysm Father   . Anxiety disorder Sister   . Depression Sister   . Diabetes Sister   . Hypertension Sister   . Atrial fibrillation Brother   . Spinal muscular atrophy Brother   . Breast cancer Sister   . Bone cancer Sister   . Depression Sister   .  Anxiety disorder Sister   . Liver cancer Sister   . Breast cancer Sister   . Colon cancer Sister   . Depression Sister   . Diabetes Sister   . COPD Sister   . Depression Sister   . Anxiety disorder Sister   . Skin cancer Sister   . Diabetes Brother   . Depression Brother   . Skin cancer Maternal Aunt   . Diabetes Maternal Aunt   . Cervical cancer Paternal Aunt     Social History: I have reviewed social history from my progress note on 06/23/2017 Social History   Socioeconomic History  . Marital status: Widowed    Spouse name: Not on file  . Number of children: 3  . Years of education: Not on file  . Highest education level: Not on file  Occupational History  . Not on file  Tobacco Use  . Smoking status: Never Smoker  . Smokeless tobacco: Never Used  Substance and Sexual Activity  . Alcohol use: No  . Drug use: No  . Sexual activity: Not Currently    Birth control/protection: Post-menopausal  Other Topics Concern  . Not on file  Social History Narrative  . Not on file   Social Determinants of Health   Financial Resource Strain:   . Difficulty of Paying Living Expenses:   Food Insecurity:   . Worried About Charity fundraiser in the Last Year:   . Arboriculturist in the Last Year:   Transportation Needs:   . Film/video editor (Medical):   Marland Kitchen Lack of Transportation (Non-Medical):   Physical Activity:   . Days of Exercise per Week:   . Minutes of Exercise per Session:   Stress:   . Feeling of Stress :   Social Connections:   . Frequency of Communication with Friends and Family:   . Frequency of Social Gatherings with Friends and Family:   . Attends Religious Services:   . Active Member of Clubs or Organizations:   . Attends Archivist Meetings:   Marland Kitchen Marital Status:     Allergies:  Allergies  Allergen Reactions  . Sulfa Antibiotics Shortness Of Breath    Metabolic Disorder Labs: Lab Results  Component Value Date   HGBA1C 5.3 06/21/2016    MPG 105 06/21/2016   No results found for: PROLACTIN Lab Results  Component Value Date   CHOL 145 06/21/2016   TRIG 51 06/21/2016   HDL 44 06/21/2016   CHOLHDL 3.3 06/21/2016   VLDL 10 06/21/2016   LDLCALC 91 06/21/2016   LDLCALC 70 08/22/2013   Lab Results  Component Value Date   TSH 1.545 02/18/2018   TSH 1.683 12/18/2017    Therapeutic Level Labs: No results found for: LITHIUM No results found for: VALPROATE No components found for:  CBMZ  Current Medications: Current Outpatient Medications  Medication Sig Dispense Refill  . busPIRone (BUSPAR) 15 MG tablet TAKE ONE TABLET BY MOUTH EVERY EVENING 90 tablet 1  . escitalopram (LEXAPRO)  20 MG tablet TAKE ONE TABLET BY MOUTH EVERY DAY 90 tablet 1  . fluticasone (FLONASE) 50 MCG/ACT nasal spray TAKE 2 SPRAYS INTO BOTH NOSTRILS TWICE ADAY    . furosemide (LASIX) 40 MG tablet Take 1 tablet (40 mg total) by mouth daily. 30 tablet 0  . lactulose (CHRONULAC) 10 GM/15ML solution     . lactulose (CHRONULAC) 10 GM/15ML solution Take by mouth.    . levothyroxine (SYNTHROID) 50 MCG tablet Take 50 mcg by mouth every morning.    Marland Kitchen LORazepam (ATIVAN) 0.5 MG tablet Take 1 tablet (0.5 mg total) by mouth as directed. Take 1 tablet once a day as needed up to 2 times a week only for severe anxiety 10 tablet 1  . NIFEdipine (PROCARDIA) 10 MG capsule Take by mouth.    . ondansetron (ZOFRAN) 8 MG tablet     . ondansetron (ZOFRAN-ODT) 8 MG disintegrating tablet Take 8 mg by mouth every 8 (eight) hours as needed for nausea or vomiting.    Marland Kitchen spironolactone (ALDACTONE) 100 MG tablet Take 1 tablet (100 mg total) by mouth daily. 30 tablet 0  . traZODone (DESYREL) 150 MG tablet TAKE ONE TABLET BY MOUTH AT BEDTIME 90 tablet 2  . XIFAXAN 550 MG TABS tablet Take 1 tablet by mouth 2 (two) times daily.    Marland Kitchen omeprazole (PRILOSEC) 40 MG capsule Take 1 capsule (40 mg total) by mouth daily. 30 capsule 1  . oxybutynin (DITROPAN) 5 MG tablet Take by mouth.    .  QUEtiapine (SEROQUEL) 100 MG tablet Take 1 tablet (100 mg total) by mouth at bedtime. 90 tablet 1   No current facility-administered medications for this visit.     Musculoskeletal: Strength & Muscle Tone: UTA Gait & Station: Observed as seated Patient leans: N/A  Psychiatric Specialty Exam: Review of Systems  Unable to perform ROS: Dementia    There were no vitals taken for this visit.There is no height or weight on file to calculate BMI.  General Appearance: Casual  Eye Contact:  Fair  Speech:  Clear and Coherent  Volume:  Normal  Mood:  Euthymic  Affect:  Congruent  Thought Process:  Goal Directed and Descriptions of Associations: Intact  Orientation:  Other:  times, person, situation  Thought Content: Logical   Suicidal Thoughts:  No  Homicidal Thoughts:  No  Memory:  Immediate;   limited Recent;   limited Remote;   limited  Judgement:  Fair  Insight:  Fair  Psychomotor Activity:  Decreased  Concentration:  Concentration: Fair and Attention Span: Fair  Recall:  [employer] of Knowledge: Fair  Language: Fair  Akathisia:  No  Handed:  Right  AIMS (if indicated): UTA  Assets:  Communication Skills Desire for Improvement Social Support  ADL's:  Impaired  Cognition: Impaired,  Moderate  Sleep:  Fair   Screenings: AUDIT     Admission (Discharged) from 06/20/2016 in [facility]  Alcohol Use Disorder Identification Test Final Score (AUDIT)  0    ECT-MADRS     ECT Treatment from 07/11/2016 in [facility] Admission (Discharged) from 06/20/2016 in [facility] Total Score  16  16    Mini-Mental     ECT Treatment from 07/11/2016 in [facility] Admission (Discharged) from 06/20/2016 in [facility]  Total Score (max 30 points )  30  28       Assessment and Plan: Remo Lipps  JAHZARA SLATTERY is a 79 year old Caucasian female who has a history of  depression, anxiety, insomnia, recent thrombocytopenia, chronic liver disease with portal hypertension, hypothyroidism, GERD was evaluated by telemedicine today.  Patient is currently stable on current medication regimen.  She does have dementia however has continued social support system.  Plan as noted below.  Plan MDD in remission Lexapro 20 mg p.o. daily Seroquel 100 mg p.o. nightly  GAD-stable BuSpar 15 mg p.o. daily Ativan 0.5 mg as needed 1-2 times a week for severe anxiety attacks  Insomnia-stable Trazodone 150 mg p.o. nightly  For major neurocognitive disorder likely secondary to multiple etiologies-she will continue to follow-up with her primary provider. Continue Seroquel for mood symptoms.  Patient currently has palliative care involved.  I have obtained collateral information from sister-Cassidy [name] as summarized above.  Follow-up in clinic in 2 to 3 months or sooner if needed.  I have spent atleast 20 minutes non  face to face with patient today. More than 50 % of the time was spent for preparing to see the patient ( e.g., review of test, records ), obtaining and to review and separately obtained history , ordering medications and test ,psychoeducation and supportive psychotherapy and care coordination,as well as documenting clinical information in electronic health record.This note was generated in part or whole with voice recognition software. Voice recognition is usually quite accurate but there are transcription errors that can and very often do occur. I apologize for any typographical errors that were not detected and corrected.        Ursula Alert, MD 09/08/2019, 5:26 PM

## 2019-09-09 ENCOUNTER — Telehealth: Payer: Self-pay | Admitting: Emergency Medicine

## 2019-09-09 NOTE — Telephone Encounter (Signed)
Called patient due to lwot to inquire about condition and follow up plans. Sister says they did follow up with pcp.  She says patient is doing okay today and she is going to call pcp again today.

## 2019-09-12 ENCOUNTER — Telehealth: Payer: Self-pay

## 2019-09-12 NOTE — Telephone Encounter (Signed)
Phone call placed to patient's sister, [name], to check in on patient and offer to schedule visit with Amy NP. [name] shared that she is having her own health issues. Sister shared that she is the primary caregiver for the patient. [name] noted that patient has appeared sad lately and is less interactive than normal. Telephone visit scheduled for 09/22/2019 @ 11:30am.

## 2019-09-22 ENCOUNTER — Other Ambulatory Visit: Payer: Self-pay

## 2019-09-22 ENCOUNTER — Other Ambulatory Visit: Payer: Medicare Other | Admitting: Adult Health Nurse Practitioner

## 2019-09-22 DIAGNOSIS — F0390 Unspecified dementia without behavioral disturbance: Secondary | ICD-10-CM

## 2019-09-22 DIAGNOSIS — Z515 Encounter for palliative care: Secondary | ICD-10-CM

## 2019-09-22 NOTE — Progress Notes (Signed)
[facility] Consult Note Telephone: [phone]  Fax: [phone]  PATIENT NAME: Cassidy [name] DOB: [birth year] MRN: [id number]  PRIMARY CARE PROVIDER:   Kirk Ruths, MD  REFERRING PROVIDER:  Kirk Ruths, MD [facility] [place],  [place] [address]  RESPONSIBLE PARTY:   Cassidy [name], sister [phone]  Due to the COVID-19 crisis, this visit was done via telemedicine and it was initiated and consent by this patient and or family. Video-audio (telehealth) contact was unable to be done due to technical barriers from the patient's side.       RECOMMENDATIONS and PLAN:  1.  Advanced care planning.  Patient is a full code. Sister has agreed to have hard choices book and blank MOST form sent to her at her address. Will have office send this to her. She is eating well with no reported weight loss. Sister states that she is sleeping well. Every now and then will have a night where she does not sleep as well but this does not happen very often.  2.  Dementia.  FAST 6b.  Patient ambulates with stand by assist. Sister states that she is having to remind her to pick up her feet more often with walking or else she will shuffle her feet. Sister encourages her to walk frequently and to do exercises so that she can stay mobile for as long as possible.She requires assistance with ADLs.Is able to feed herself. She is eating well with no reported weight loss. Sister states that she is sleeping well. Every now and then will have a night where she does not sleep as well but this does not happen very often. Sister states that she does realize that with the dementia that her functional status will worsen. Does state that that is when she will need to inquire about more help in the home. Discussed that she can call on [place] and we can help her to help find resources that she can utilize to get help in the home when that time  comes.  She is currently on buspar 15 mg BID, lexapro 20 mg daily, seroquel 100 mg QHS, trazodone 150 mg QHS, and clonazepam 0.5 mg daily PRN. Continue current supportive care at home.  3.  NASH/cirrhosis of the liver. Being followed by Dr. Marius [name] with GI. Patient is taking Xifaxan 563m BID, lactulose 170m (10g) BID, and lasix 40 mg BID. Sister does state that about 3 weeks ago patient was having increased edema in which PCP increased her Lasix for 3 days. Sister states that she has not had any increased edema since then.Denies increased SOB or cough, edema, N/V/D, constipation today. Continue follow up and recommendations by GI  Sister does state that a few days ago she did have a fall out of her wheelchair when she was trying to reach for something on the floor. No injuries related to this fall. Palliative will continue to monitor for symptom management/decline and make recommendations as needed. Next appointment is in 6 weeks  I spent 60 minutes providing this consultation,  from 11:30 to 12:30 including time with patient/family, chart review, provider coordination, documentation. More than 50% of the time in this consultation was spent coordinating communication.   HISTORY OF PRESENT ILLNESS:  Cassidy BEDOREs a [age].o. year old female with multiple medical problems including Dementia,NASH,cirrhosis of liver,portal HTN,GERD, hypothyroidism, chronic thrombocytopenia. Palliative Care was asked to help address goals of care.  CODE STATUS: full code  PPS: 0% HOSPICE ELIGIBILITY/DIAGNOSIS: TBD  PHYSICAL EXAM:   Deferred  PAST MEDICAL HISTORY:  Past Medical History:  Diagnosis Date  . Anxiety   . Depression   . Fatty liver   . GERD (gastroesophageal reflux disease)   . Hypothyroidism   . Obesity   . Thyroid disease     SOCIAL HX:  Social History   Tobacco Use  . Smoking status: Never Smoker  . Smokeless tobacco: Never Used  Substance Use Topics  . Alcohol use: No     ALLERGIES:  Allergies  Allergen Reactions  . Sulfa Antibiotics Shortness Of Breath     PERTINENT MEDICATIONS:  Outpatient Encounter Medications as of 09/22/2019  Medication Sig  . busPIRone (BUSPAR) 15 MG tablet TAKE ONE TABLET BY MOUTH EVERY EVENING  . escitalopram (LEXAPRO) 20 MG tablet TAKE ONE TABLET BY MOUTH EVERY DAY  . fluticasone (FLONASE) 50 MCG/ACT nasal spray TAKE 2 SPRAYS INTO BOTH NOSTRILS TWICE ADAY  . furosemide (LASIX) 40 MG tablet Take 1 tablet (40 mg total) by mouth daily.  Marland Kitchen lactulose (CHRONULAC) 10 GM/15ML solution   . lactulose (CHRONULAC) 10 GM/15ML solution Take by mouth.  . levothyroxine (SYNTHROID) 50 MCG tablet Take 50 mcg by mouth every morning.  Marland Kitchen LORazepam (ATIVAN) 0.5 MG tablet Take 1 tablet (0.5 mg total) by mouth as directed. Take 1 tablet once a day as needed up to 2 times a week only for severe anxiety  . NIFEdipine (PROCARDIA) 10 MG capsule Take by mouth.  Marland Kitchen omeprazole (PRILOSEC) 40 MG capsule Take 1 capsule (40 mg total) by mouth daily.  . ondansetron (ZOFRAN) 8 MG tablet   . ondansetron (ZOFRAN-ODT) 8 MG disintegrating tablet Take 8 mg by mouth every 8 (eight) hours as needed for nausea or vomiting.  Marland Kitchen oxybutynin (DITROPAN) 5 MG tablet Take by mouth.  . QUEtiapine (SEROQUEL) 100 MG tablet Take 1 tablet (100 mg total) by mouth at bedtime.  Marland Kitchen spironolactone (ALDACTONE) 100 MG tablet Take 1 tablet (100 mg total) by mouth daily.  . traZODone (DESYREL) 150 MG tablet TAKE ONE TABLET BY MOUTH AT BEDTIME  . XIFAXAN 550 MG TABS tablet Take 1 tablet by mouth 2 (two) times daily.   No facility-administered encounter medications on file as of 09/22/2019.      Cassidy Tomaselli Jenetta Downer, NP

## 2019-10-19 ENCOUNTER — Telehealth: Payer: Self-pay

## 2019-10-19 NOTE — Telephone Encounter (Signed)
faxed and confirmed notified pharmacy that a prior auth was not needed for the patient medication per [email or web address]

## 2019-10-19 NOTE — Telephone Encounter (Signed)
went online to [email or web address] submited prior auth.  it camed back as prior authorization is not needed for the patient medication.

## 2019-10-19 NOTE — Telephone Encounter (Signed)
received requested for prior authorization on the escitalopram oxalate 18m

## 2019-11-03 ENCOUNTER — Other Ambulatory Visit: Payer: Medicare Other | Admitting: Adult Health Nurse Practitioner

## 2019-11-03 ENCOUNTER — Other Ambulatory Visit: Payer: Self-pay

## 2019-11-03 DIAGNOSIS — F0390 Unspecified dementia without behavioral disturbance: Secondary | ICD-10-CM

## 2019-11-03 DIAGNOSIS — Z515 Encounter for palliative care: Secondary | ICD-10-CM

## 2019-11-03 NOTE — Progress Notes (Signed)
[facility] Consult Note Telephone: [phone]  Fax: [phone]  PATIENT NAME: Cassidy [name] DOB: [birth year] MRN: [id number]  PRIMARY CARE PROVIDER:   Kirk Ruths, MD  REFERRING PROVIDER:  Kirk Ruths, MD [name] [place],  [place] [address]  RESPONSIBLE PARTY:   Cassidy [name], sister [phone]    RECOMMENDATIONS and PLAN:  1.  Advanced care planning.  Patient is full code.  Discussed this with the sister today to see if they wanted to make any changes.  No changes were made today.  Encouraged family to discuss this together and will discuss further at future visits.  2.  Dementia. FAST 6b. Patient ambulates with stand by assist. Sister states that she is having to remind her to pick up her feet more often with walking or else she will shuffle her feet.   Denies falls.  Sister encourages her to walk frequently and to do exercises so that she can stay mobile for as long as possible.She requires assistance with ADLs.Is able to feed herself.She is eating well with no reported weight loss.  Sister states that she is sleeping well. Every now and then will have a night where she does not sleep as well but this does not happen very often.  She is currently on buspar 15 mg BID, lexapro 20 mg daily, seroquel 100 mg QHS, trazodone 150 mg QHS, and clonazepam 0.5 mg daily PRN. Continue current supportive careat home.  3.  Support.  Sister, Cassidy [name], states that it has been harder to motivate her sister to do exercises and to do things for herself.  Does understand that this is part of the disease progression of dementia.  Please also states that she has some newly diagnosed medical issues of her own and some days finds it hard to keep up with everything that she needs to do.  States that she does not have the income to hire someone to come into the home and does not want to place her sister in a  facility.  Did provide the number for [facility] and encouraged her to call to see if there are any resources that they can help her with.  This is encouraged to call with any questions or concerns  Patient was treated for possible diverticulitis about a month ago when she was experiencing some nausea and abdominal pain.  Denies nausea, abdominal pain, diarrhea, constipation today.  Denies pain, dizziness, chest pain, shortness of breath, cough, fever, dysuria, hematuria.  Palliative will continue to monitor for symptom management/decline and make recommendations as needed.  Next appointment is in 6 weeks  I spent 90 minutes providing this consultation,  from 2:00 to 3:30 including time spent with patient/family, chart review, provider coordination, documentation. More than 50% of the time in this consultation was spent coordinating communication.   HISTORY OF PRESENT ILLNESS:  Cassidy [name] is a 79 y.o. year old female with multiple medical problems including Dementia,NASH,cirrhosis of liver,portal HTN,GERD, hypothyroidism, chronic thrombocytopenia. Palliative Care was asked to help address goals of care.   CODE STATUS: Full code  PPS: 40% HOSPICE ELIGIBILITY/DIAGNOSIS: TBD  PHYSICAL EXAM:  BP 132/78 HR 89 O2 95% on room air General: NAD, frail appearing Cardiovascular: regular rate and rhythm Pulmonary: Lung sounds clear; normal respiratory effort Abdomen: soft, nontender, + bowel sounds GU: no suprapubic tenderness Extremities: no edema, no joint deformities Skin: no rashes on exposed skin Neurological: Weakness; alert and  oriented to person and place   PAST MEDICAL HISTORY:  Past Medical History:  Diagnosis Date  . Anxiety   . Depression   . Fatty liver   . GERD (gastroesophageal reflux disease)   . Hypothyroidism   . Obesity   . Thyroid disease     SOCIAL HX:  Social History   Tobacco Use  . Smoking status: Never Smoker  . Smokeless tobacco: Never Used    Substance Use Topics  . Alcohol use: No    ALLERGIES:  Allergies  Allergen Reactions  . Sulfa Antibiotics Shortness Of Breath     PERTINENT MEDICATIONS:  Outpatient Encounter Medications as of 11/03/2019  Medication Sig  . busPIRone (BUSPAR) 15 MG tablet TAKE ONE TABLET BY MOUTH EVERY EVENING  . escitalopram (LEXAPRO) 20 MG tablet TAKE ONE TABLET BY MOUTH EVERY DAY  . fluticasone (FLONASE) 50 MCG/ACT nasal spray TAKE 2 SPRAYS INTO BOTH NOSTRILS TWICE ADAY  . furosemide (LASIX) 40 MG tablet Take 1 tablet (40 mg total) by mouth daily.  Marland Kitchen lactulose (CHRONULAC) 10 GM/15ML solution   . lactulose (CHRONULAC) 10 GM/15ML solution Take by mouth.  . levothyroxine (SYNTHROID) 50 MCG tablet Take 50 mcg by mouth every morning.  Marland Kitchen LORazepam (ATIVAN) 0.5 MG tablet Take 1 tablet (0.5 mg total) by mouth as directed. Take 1 tablet once a day as needed up to 2 times a week only for severe anxiety  . NIFEdipine (PROCARDIA) 10 MG capsule Take by mouth.  Marland Kitchen omeprazole (PRILOSEC) 40 MG capsule Take 1 capsule (40 mg total) by mouth daily.  . ondansetron (ZOFRAN) 8 MG tablet   . ondansetron (ZOFRAN-ODT) 8 MG disintegrating tablet Take 8 mg by mouth every 8 (eight) hours as needed for nausea or vomiting.  Marland Kitchen oxybutynin (DITROPAN) 5 MG tablet Take by mouth.  . QUEtiapine (SEROQUEL) 100 MG tablet Take 1 tablet (100 mg total) by mouth at bedtime.  Marland Kitchen spironolactone (ALDACTONE) 100 MG tablet Take 1 tablet (100 mg total) by mouth daily.  . traZODone (DESYREL) 150 MG tablet TAKE ONE TABLET BY MOUTH AT BEDTIME  . XIFAXAN 550 MG TABS tablet Take 1 tablet by mouth 2 (two) times daily.   No facility-administered encounter medications on file as of 11/03/2019.     Lorenzo Arscott Jenetta Downer, NP

## 2019-11-14 ENCOUNTER — Telehealth: Payer: Medicare Other | Admitting: Psychiatry

## 2019-11-14 ENCOUNTER — Other Ambulatory Visit: Payer: Self-pay

## 2019-11-17 ENCOUNTER — Other Ambulatory Visit: Payer: Self-pay | Admitting: Psychiatry

## 2019-11-17 DIAGNOSIS — F330 Major depressive disorder, recurrent, mild: Secondary | ICD-10-CM

## 2019-11-24 ENCOUNTER — Other Ambulatory Visit
Admission: RE | Admit: 2019-11-24 | Discharge: 2019-11-24 | Disposition: A | Discharge status: Home / Self Care | Payer: Medicare Other | Source: Ambulatory Visit | Attending: Internal Medicine | Admitting: Internal Medicine

## 2019-11-24 DIAGNOSIS — R05 Cough: Secondary | ICD-10-CM | POA: Diagnosis present

## 2019-11-24 DIAGNOSIS — F325 Major depressive disorder, single episode, in full remission: Secondary | ICD-10-CM | POA: Diagnosis not present

## 2019-11-24 DIAGNOSIS — R5381 Other malaise: Secondary | ICD-10-CM | POA: Insufficient documentation

## 2019-11-24 LAB — AMMONIA: Ammonia: 40 umol/L — ABNORMAL HIGH (ref 9–35)

## 2019-12-09 ENCOUNTER — Other Ambulatory Visit: Payer: Self-pay

## 2019-12-09 ENCOUNTER — Encounter: Payer: Self-pay | Admitting: Psychiatry

## 2019-12-09 ENCOUNTER — Telehealth (INDEPENDENT_AMBULATORY_CARE_PROVIDER_SITE_OTHER): Payer: Medicare Other | Admitting: Psychiatry

## 2019-12-09 DIAGNOSIS — F5105 Insomnia due to other mental disorder: Secondary | ICD-10-CM | POA: Diagnosis not present

## 2019-12-09 DIAGNOSIS — F3342 Major depressive disorder, recurrent, in full remission: Secondary | ICD-10-CM | POA: Diagnosis not present

## 2019-12-09 DIAGNOSIS — F0280 Dementia in other diseases classified elsewhere without behavioral disturbance: Secondary | ICD-10-CM

## 2019-12-09 DIAGNOSIS — F411 Generalized anxiety disorder: Secondary | ICD-10-CM | POA: Diagnosis not present

## 2019-12-09 NOTE — Progress Notes (Signed)
Provider Location : [facility] Patient Location : Home  Participants: Patient , Provider  Virtual Visit via Video Note  I connected with Cassidy [name] on 12/09/19 at 11:30 AM EDT by a video enabled telemedicine application and verified that I am speaking with the correct person using two identifiers.   I discussed the limitations of evaluation and management by telemedicine and the availability of in person appointments. The patient expressed understanding and agreed to proceed.   I discussed the assessment and treatment plan with the patient. The patient was provided an opportunity to ask questions and all were answered. The patient agreed with the plan and demonstrated an understanding of the instructions.   The patient was advised to call back or seek an in-person evaluation if the symptoms worsen or if the condition fails to improve as anticipated.   [facility] MD OP Progress Note  12/09/2019 12:48 PM OLLA DELANCEY  MRN:  [id number]  Chief Complaint:  Chief Complaint    Follow-up     HPI: Cassidy [name] is a 79 year old Caucasian female who has a history of MDD, GAD, panic attacks, insomnia, thrombocytopenia, hypothyroidism, chronic liver disease with portal hypertension, gastroesophageal reflux disease, dementia was evaluated by telemedicine today.  Patient's sister-[name] who is primary caretaker provided collateral information.  Patient appeared to be alert, oriented to time, self and situation.  She was able to respond to questions appropriately and appeared to be pleasant and cooperative in session today.  Patient reports she has been compliant on medications.  She has been watching a TV show 'secondhand lions, and reports she enjoys it.  She reports appetite is too good.  She denies any concerns with medications or any self-injurious thoughts or hallucinations.  She however being a limited historian collateral information was obtained from sister.  Per sister patient recently lost her sister  who passed away due to medical reasons.  Patient had a few days of confusion and agitation however she is much better now.  The lorazepam was given when she had agitation however that made it worse.  Per [name] they are currently taking it one day at a time.  She agrees to get in touch with palliative care as well as primary care provider.  She is not interested in any medication changes at this time since patient is currently doing okay.      Visit Diagnosis:    ICD-10-CM   1. MDD (major depressive disorder), recurrent, in full remission ([place])  F33.42   2. GAD (generalized anxiety disorder)  F41.1   3. Insomnia due to mental disorder  F51.05   4. 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 Anxiety   . Depression   . Fatty liver   . GERD (gastroesophageal reflux disease)   . Hypothyroidism   . Obesity   . Thyroid disease     Past Surgical History:  Procedure Laterality Date  . APPENDECTOMY    . DILATATION & CURETTAGE/HYSTEROSCOPY WITH MYOSURE N/A 02/04/2018   Procedure: DILATATION & CURETTAGE/HYSTEROSCOPY WITH MYOSURE;  Surgeon: Ward, Honor Loh, MD;  Location: [facility] ORS;  Service: Gynecology;  Laterality: N/A;  . ESOPHAGOGASTRODUODENOSCOPY (EGD) WITH PROPOFOL N/A 12/20/2014   Procedure: ESOPHAGOGASTRODUODENOSCOPY (EGD) WITH PROPOFOL;  Surgeon: Manya Silvas, MD;  Location: [facility] ENDOSCOPY;  Service: Endoscopy;  Laterality:  N/A;  . Azzie Almas DILATION N/A 12/20/2014   Procedure: SAVORY DILATION;  Surgeon: Manya Silvas, MD;  Location: [facility] ENDOSCOPY;  Service: Endoscopy;  Laterality: N/A;  . TONSILLECTOMY      Family Psychiatric History: I have reviewed family psychiatric history from my progress note on 06/23/2017  Family History:  Family History  Problem Relation Age of Onset  .  Diabetes Mother   . COPD Mother   . Kidney disease Mother   . Depression Mother   . Hypertension Mother   . Heart attack Father   . Aneurysm Father   . Anxiety disorder Sister   . Depression Sister   . Diabetes Sister   . Hypertension Sister   . Atrial fibrillation Brother   . Spinal muscular atrophy Brother   . Breast cancer Sister   . Bone cancer Sister   . Depression Sister   . Anxiety disorder Sister   . Liver cancer Sister   . Breast cancer Sister   . Colon cancer Sister   . Depression Sister   . Diabetes Sister   . COPD Sister   . Depression Sister   . Anxiety disorder Sister   . Skin cancer Sister   . Diabetes Brother   . Depression Brother   . Skin cancer Maternal Aunt   . Diabetes Maternal Aunt   . Cervical cancer Paternal Aunt     Social History: I have reviewed social history from my progress note on 06/23/2017 Social History   Socioeconomic History  . Marital status: Widowed    Spouse name: Not on file  . Number of children: 3  . Years of education: Not on file  . Highest education level: Not on file  Occupational History  . Not on file  Tobacco Use  . Smoking status: Never Smoker  . Smokeless tobacco: Never Used  Vaping Use  . Vaping Use: Never used  Substance and Sexual Activity  . Alcohol use: No  . Drug use: No  . Sexual activity: Not Currently    Birth control/protection: Post-menopausal  Other Topics Concern  . Not on file  Social History Narrative  . Not on file   Social Determinants of Health   Financial Resource Strain:   . Difficulty of Paying Living Expenses: Not on file  Food Insecurity:   . Worried About Charity fundraiser in the Last Year: Not on file  . Ran Out of Food in the Last Year: Not on file  Transportation Needs:   . Lack of Transportation (Medical): Not on file  . Lack of Transportation (Non-Medical): Not on file  Physical Activity:   . Days of Exercise per Week: Not on file  . Minutes of Exercise per Session:  Not on file  Stress:   . Feeling of Stress : Not on file  Social Connections:   . Frequency of Communication with Friends and Family: Not on file  . Frequency of Social Gatherings with Friends and Family: Not on file  . Attends Religious Services: Not on file  . Active Member of Clubs or Organizations: Not on file  . Attends Archivist Meetings: Not on file  . Marital Status: Not on file    Allergies:  Allergies  Allergen Reactions  . Sulfa Antibiotics Shortness Of Breath    Metabolic Disorder Labs: Lab Results  Component Value Date   HGBA1C 5.3 06/21/2016   MPG 105 06/21/2016   No results found for: PROLACTIN Lab Results  Component Value  Date   CHOL 145 06/21/2016   TRIG 51 06/21/2016   HDL 44 06/21/2016   CHOLHDL 3.3 06/21/2016   VLDL 10 06/21/2016   LDLCALC 91 06/21/2016   LDLCALC 70 08/22/2013   Lab Results  Component Value Date   TSH 1.545 02/18/2018   TSH 1.683 12/18/2017    Therapeutic Level Labs: No results found for: LITHIUM No results found for: VALPROATE No components found for:  CBMZ  Current Medications: Current Outpatient Medications  Medication Sig Dispense Refill  . busPIRone (BUSPAR) 15 MG tablet TAKE ONE TABLET BY MOUTH EVERY EVENING 90 tablet 1  . ciprofloxacin (CIPRO) 250 MG tablet Take 250 mg by mouth 2 (two) times daily.    Marland Kitchen escitalopram (LEXAPRO) 20 MG tablet TAKE ONE TABLET BY MOUTH EVERY DAY 90 tablet 1  . fexofenadine (ALLEGRA) 180 MG tablet Take by mouth.    . fluticasone (FLONASE) 50 MCG/ACT nasal spray TAKE 2 SPRAYS INTO BOTH NOSTRILS TWICE ADAY    . furosemide (LASIX) 40 MG tablet Take 1 tablet (40 mg total) by mouth daily. 30 tablet 0  . lactulose (CHRONULAC) 10 GM/15ML solution     . lactulose (CHRONULAC) 10 GM/15ML solution Take by mouth.    . levothyroxine (SYNTHROID) 50 MCG tablet Take 50 mcg by mouth every morning.    . metroNIDAZOLE (FLAGYL) 250 MG tablet Take 250 mg by mouth 3 (three) times daily.    Marland Kitchen  NIFEdipine (PROCARDIA) 10 MG capsule Take by mouth.    Marland Kitchen omeprazole (PRILOSEC) 40 MG capsule Take 1 capsule (40 mg total) by mouth daily. 30 capsule 1  . ondansetron (ZOFRAN) 8 MG tablet     . ondansetron (ZOFRAN-ODT) 8 MG disintegrating tablet Take 8 mg by mouth every 8 (eight) hours as needed for nausea or vomiting.    Marland Kitchen oxybutynin (DITROPAN) 5 MG tablet Take by mouth.    . QUEtiapine (SEROQUEL) 100 MG tablet Take 1 tablet (100 mg total) by mouth at bedtime. 90 tablet 1  . spironolactone (ALDACTONE) 100 MG tablet Take 1 tablet (100 mg total) by mouth daily. 30 tablet 0  . traZODone (DESYREL) 150 MG tablet TAKE ONE TABLET BY MOUTH AT BEDTIME 90 tablet 2  . XIFAXAN 550 MG TABS tablet Take 1 tablet by mouth 2 (two) times daily.     No current facility-administered medications for this visit.     Musculoskeletal: Strength & Muscle Tone: UTA Gait & Station: Observed as seated Patient leans: N/A  Psychiatric Specialty Exam: Review of Systems  Unable to perform ROS: Dementia    There were no vitals taken for this visit.There is no height or weight on file to calculate BMI.  General Appearance: Casual  Eye Contact:  Fair  Speech:  Clear and Coherent  Volume:  Normal  Mood:  Euthymic  Affect:  Congruent  Thought Process:  Goal Directed and Descriptions of Associations: Intact  Orientation:  Full (Time, Place, and Person)  Thought Content: Logical   Suicidal Thoughts:  No  Homicidal Thoughts:  No  Memory:  Immediate;   Fair Recent;   Poor Remote;   Poor  Judgement:  Fair  Insight:  Fair  Psychomotor Activity:  Normal  Concentration:  Concentration: Fair and Attention Span: Fair  Recall:  [employer] of Knowledge: Fair  Language: Fair  Akathisia:  No  Handed:  Right  AIMS (if indicated): UTA  Assets:  Communication Skills Desire for Improvement Housing Social Support  ADL's:  Intact  Cognition: WNL  Sleep:  Fair   Screenings: AUDIT     Admission (Discharged) from  06/20/2016 in [facility]  Alcohol Use Disorder Identification Test Final Score (AUDIT) 0    ECT-MADRS     ECT Treatment from 07/11/2016 in [facility] Admission (Discharged) from 06/20/2016 in [facility] Total Score 16 16    Mini-Mental     ECT Treatment from 07/11/2016 in [place] Admission (Discharged) from 06/20/2016 in [facility]  Total Score (max 30 points ) 30 28       Assessment and Plan: IRINA OKELLY is a 79 year old Caucasian female who has a history of depression, anxiety, insomnia, recent thrombocytopenia, chronic liver disease with portal hypertension, hypothyroidism, GERD was evaluated by telemedicine today.  Patient is currently stable on current medication regimen.  Plan as noted below.  Plan MDD in remission Lexapro 20 mg p.o. daily Seroquel 100 mg p.o. nightly  GAD-stable BuSpar 15 mg p.o. daily Discontinue Ativan for lack of benefit and paradoxical effect.   Insomnia-stable Trazodone 150 mg p.o. nightly  For major neurocognitive disorder likely secondary to multiple etiologies-she will continue to follow-up with primary care provider. Continue Seroquel for mood symptoms.  Collateral information was obtained from Sister-[name] as summarized above.  Discussed with sister that her Seroquel can be readjusted if she continues to have a lot of agitation.  Also discussed to make contact with palliative care as well as primary care for other resources available.  Provided supportive psychotherapy for [place] caretaker for patient.  Follow-up in clinic in 1 month or sooner if needed.  I have spent atleast 20 minutes  face to face with patient today. More than 50 % of the time was spent for preparing to see the patient ( e.g., review of test, records ), obtaining and to review and separately obtained history , ordering medications  and test ,psychoeducation and supportive psychotherapy and care coordination,as well as documenting clinical information in electronic health record. This note was generated in part or whole with voice recognition software. Voice recognition is usually quite accurate but there are transcription errors that can and very often do occur. I apologize for any typographical errors that were not detected and corrected.      Ursula Alert, MD 12/09/2019, 12:48 PM

## 2019-12-15 ENCOUNTER — Other Ambulatory Visit: Payer: Self-pay

## 2019-12-15 ENCOUNTER — Emergency Department: Payer: Medicare Other | Admitting: Anesthesiology

## 2019-12-15 ENCOUNTER — Inpatient Hospital Stay
Admission: EM | Admit: 2019-12-15 | Discharge: 2019-12-15 | DRG: 395 | Disposition: A | Discharge status: Home / Self Care | Payer: Medicare Other | Attending: Family Medicine | Admitting: Family Medicine

## 2019-12-15 ENCOUNTER — Encounter
Admission: EM | Disposition: A | Discharge status: Home / Self Care | Payer: Self-pay | Source: Home / Self Care | Attending: Family Medicine

## 2019-12-15 ENCOUNTER — Ambulatory Visit: Admit: 2019-12-15 | Payer: Medicare Other | Admitting: Gastroenterology

## 2019-12-15 ENCOUNTER — Emergency Department: Payer: Medicare Other

## 2019-12-15 ENCOUNTER — Other Ambulatory Visit: Payer: Medicare Other | Admitting: Adult Health Nurse Practitioner

## 2019-12-15 ENCOUNTER — Emergency Department
Admission: EM | Admit: 2019-12-15 | Discharge: 2019-12-15 | Disposition: A | Discharge status: Home / Self Care | Payer: Medicare Other | Source: Home / Self Care

## 2019-12-15 DIAGNOSIS — Z8049 Family history of malignant neoplasm of other genital organs: Secondary | ICD-10-CM | POA: Diagnosis not present

## 2019-12-15 DIAGNOSIS — K7460 Unspecified cirrhosis of liver: Secondary | ICD-10-CM | POA: Diagnosis present

## 2019-12-15 DIAGNOSIS — E039 Hypothyroidism, unspecified: Secondary | ICD-10-CM | POA: Diagnosis present

## 2019-12-15 DIAGNOSIS — T18108S Unspecified foreign body in esophagus causing other injury, sequela: Secondary | ICD-10-CM

## 2019-12-15 DIAGNOSIS — K222 Esophageal obstruction: Secondary | ICD-10-CM | POA: Diagnosis present

## 2019-12-15 DIAGNOSIS — Z8249 Family history of ischemic heart disease and other diseases of the circulatory system: Secondary | ICD-10-CM | POA: Diagnosis not present

## 2019-12-15 DIAGNOSIS — W44F3XA Food entering into or through a natural orifice, initial encounter: Secondary | ICD-10-CM

## 2019-12-15 DIAGNOSIS — R1310 Dysphagia, unspecified: Secondary | ICD-10-CM

## 2019-12-15 DIAGNOSIS — Z5321 Procedure and treatment not carried out due to patient leaving prior to being seen by health care provider: Secondary | ICD-10-CM | POA: Insufficient documentation

## 2019-12-15 DIAGNOSIS — Z825 Family history of asthma and other chronic lower respiratory diseases: Secondary | ICD-10-CM

## 2019-12-15 DIAGNOSIS — K7581 Nonalcoholic steatohepatitis (NASH): Secondary | ICD-10-CM | POA: Diagnosis present

## 2019-12-15 DIAGNOSIS — F411 Generalized anxiety disorder: Secondary | ICD-10-CM | POA: Diagnosis present

## 2019-12-15 DIAGNOSIS — J90 Pleural effusion, not elsewhere classified: Secondary | ICD-10-CM | POA: Diagnosis present

## 2019-12-15 DIAGNOSIS — Z841 Family history of disorders of kidney and ureter: Secondary | ICD-10-CM | POA: Diagnosis not present

## 2019-12-15 DIAGNOSIS — Z20822 Contact with and (suspected) exposure to covid-19: Secondary | ICD-10-CM | POA: Diagnosis present

## 2019-12-15 DIAGNOSIS — Z800 Family history of malignant neoplasm of digestive organs: Secondary | ICD-10-CM

## 2019-12-15 DIAGNOSIS — F3341 Major depressive disorder, recurrent, in partial remission: Secondary | ICD-10-CM | POA: Diagnosis present

## 2019-12-15 DIAGNOSIS — R109 Unspecified abdominal pain: Secondary | ICD-10-CM | POA: Insufficient documentation

## 2019-12-15 DIAGNOSIS — Z808 Family history of malignant neoplasm of other organs or systems: Secondary | ICD-10-CM | POA: Diagnosis not present

## 2019-12-15 DIAGNOSIS — K3189 Other diseases of stomach and duodenum: Secondary | ICD-10-CM | POA: Diagnosis not present

## 2019-12-15 DIAGNOSIS — T18108A Unspecified foreign body in esophagus causing other injury, initial encounter: Secondary | ICD-10-CM

## 2019-12-15 DIAGNOSIS — F0390 Unspecified dementia without behavioral disturbance: Secondary | ICD-10-CM | POA: Diagnosis present

## 2019-12-15 DIAGNOSIS — Z833 Family history of diabetes mellitus: Secondary | ICD-10-CM | POA: Diagnosis not present

## 2019-12-15 DIAGNOSIS — R1110 Vomiting, unspecified: Secondary | ICD-10-CM | POA: Insufficient documentation

## 2019-12-15 DIAGNOSIS — Z803 Family history of malignant neoplasm of breast: Secondary | ICD-10-CM

## 2019-12-15 DIAGNOSIS — T18128A Food in esophagus causing other injury, initial encounter: Principal | ICD-10-CM

## 2019-12-15 DIAGNOSIS — Z818 Family history of other mental and behavioral disorders: Secondary | ICD-10-CM

## 2019-12-15 DIAGNOSIS — J9601 Acute respiratory failure with hypoxia: Secondary | ICD-10-CM

## 2019-12-15 DIAGNOSIS — R0902 Hypoxemia: Secondary | ICD-10-CM

## 2019-12-15 DIAGNOSIS — D696 Thrombocytopenia, unspecified: Secondary | ICD-10-CM | POA: Diagnosis present

## 2019-12-15 HISTORY — DX: Acute respiratory failure with hypoxia: J96.01

## 2019-12-15 HISTORY — PX: ESOPHAGOGASTRODUODENOSCOPY: SHX5428

## 2019-12-15 LAB — COMPREHENSIVE METABOLIC PANEL
ALT: 19 U/L (ref 0–44)
ALT: 17 U/L (ref 0–44)
AST: 35 U/L (ref 15–41)
AST: 30 U/L (ref 15–41)
Albumin: 3.7 g/dL (ref 3.5–5.0)
Albumin: 3.5 g/dL (ref 3.5–5.0)
Alkaline Phosphatase: 80 U/L (ref 38–126)
Alkaline Phosphatase: 75 U/L (ref 38–126)
Anion gap: 10 (ref 5–15)
Anion gap: 9 (ref 5–15)
BUN: 9 mg/dL (ref 8–23)
BUN: 11 mg/dL (ref 8–23)
CO2: 30 mmol/L (ref 22–32)
CO2: 27 mmol/L (ref 22–32)
Calcium: 9.4 mg/dL (ref 8.9–10.3)
Calcium: 9 mg/dL (ref 8.9–10.3)
Chloride: 99 mmol/L (ref 98–111)
Chloride: 101 mmol/L (ref 98–111)
Creatinine, Ser: 1.06 mg/dL — ABNORMAL HIGH (ref 0.44–1.00)
Creatinine, Ser: 1.06 mg/dL — ABNORMAL HIGH (ref 0.44–1.00)
GFR calc Af Amer: 58 mL/min — ABNORMAL LOW (ref >60)
GFR calc Af Amer: 58 mL/min — ABNORMAL LOW (ref >60)
GFR calc non Af Amer: 50 mL/min — ABNORMAL LOW (ref >60)
GFR calc non Af Amer: 50 mL/min — ABNORMAL LOW (ref >60)
Glucose, Bld: 118 mg/dL — ABNORMAL HIGH (ref 70–99)
Glucose, Bld: 103 mg/dL — ABNORMAL HIGH (ref 70–99)
Potassium: 3.6 mmol/L (ref 3.5–5.1)
Potassium: 3.6 mmol/L (ref 3.5–5.1)
Sodium: 139 mmol/L (ref 135–145)
Sodium: 137 mmol/L (ref 135–145)
Total Bilirubin: 1.5 mg/dL — ABNORMAL HIGH (ref 0.3–1.2)
Total Bilirubin: 1.9 mg/dL — ABNORMAL HIGH (ref 0.3–1.2)
Total Protein: 7.2 g/dL (ref 6.5–8.1)
Total Protein: 6.8 g/dL (ref 6.5–8.1)

## 2019-12-15 LAB — SARS CORONAVIRUS 2 BY RT PCR (HOSPITAL ORDER, PERFORMED IN ~~LOC~~ HOSPITAL LAB): SARS Coronavirus 2: NEGATIVE

## 2019-12-15 LAB — LIPASE, BLOOD
Lipase: 31 U/L (ref 11–51)
Lipase: 27 U/L (ref 11–51)

## 2019-12-15 LAB — CBC WITH DIFFERENTIAL/PLATELET
Abs Immature Granulocytes: 0.02 10*3/uL (ref 0.00–0.07)
Basophils Absolute: 0 10*3/uL (ref 0.0–0.1)
Basophils Relative: 0 %
Eosinophils Absolute: 0.1 10*3/uL (ref 0.0–0.5)
Eosinophils Relative: 1 %
HCT: 33.7 % — ABNORMAL LOW (ref 36.0–46.0)
Hemoglobin: 11.5 g/dL — ABNORMAL LOW (ref 12.0–15.0)
Immature Granulocytes: 0 %
Lymphocytes Relative: 8 %
Lymphs Abs: 0.4 10*3/uL — ABNORMAL LOW (ref 0.7–4.0)
MCH: 30.9 pg (ref 26.0–34.0)
MCHC: 34.1 g/dL (ref 30.0–36.0)
MCV: 90.6 fL (ref 80.0–100.0)
Monocytes Absolute: 0.4 10*3/uL (ref 0.1–1.0)
Monocytes Relative: 8 %
Neutro Abs: 4.3 10*3/uL (ref 1.7–7.7)
Neutrophils Relative %: 83 %
Platelets: 78 10*3/uL — ABNORMAL LOW (ref 150–400)
RBC: 3.72 MIL/uL — ABNORMAL LOW (ref 3.87–5.11)
RDW: 15.8 % — ABNORMAL HIGH (ref 11.5–15.5)
WBC: 5.2 10*3/uL (ref 4.0–10.5)
nRBC: 0 % (ref 0.0–0.2)

## 2019-12-15 LAB — CBC
HCT: 36.1 % (ref 36.0–46.0)
Hemoglobin: 11.8 g/dL — ABNORMAL LOW (ref 12.0–15.0)
MCH: 30.7 pg (ref 26.0–34.0)
MCHC: 32.7 g/dL (ref 30.0–36.0)
MCV: 94 fL (ref 80.0–100.0)
Platelets: 91 10*3/uL — ABNORMAL LOW (ref 150–400)
RBC: 3.84 MIL/uL — ABNORMAL LOW (ref 3.87–5.11)
RDW: 15.5 % (ref 11.5–15.5)
WBC: 4.5 10*3/uL (ref 4.0–10.5)
nRBC: 0 % (ref 0.0–0.2)

## 2019-12-15 SURGERY — EGD (ESOPHAGOGASTRODUODENOSCOPY)
Anesthesia: General

## 2019-12-15 MED ORDER — FENTANYL CITRATE (PF) 100 MCG/2ML IJ SOLN
INTRAMUSCULAR | Status: DC | PRN
Start: 2019-12-15 — End: 2019-12-15
  Administered 2019-12-15: 25 ug via INTRAVENOUS
  Administered 2019-12-15: 50 ug via INTRAVENOUS
  Administered 2019-12-15: 25 ug via INTRAVENOUS

## 2019-12-15 MED ORDER — SUGAMMADEX SODIUM 200 MG/2ML IV SOLN
INTRAVENOUS | Status: DC | PRN
  Administered 2019-12-15: 128 mg via INTRAVENOUS

## 2019-12-15 MED ORDER — IPRATROPIUM-ALBUTEROL 0.5-2.5 (3) MG/3ML IN SOLN
3.0000 mL | Freq: Once | RESPIRATORY_TRACT | Status: AC
  Administered 2019-12-15: 3 mL via RESPIRATORY_TRACT

## 2019-12-15 MED ORDER — ONDANSETRON HCL 4 MG/2ML IJ SOLN: 4.0000 mg | Freq: Once | INTRAMUSCULAR | Status: DC | PRN

## 2019-12-15 MED ORDER — ONDANSETRON HCL 4 MG/2ML IJ SOLN
INTRAMUSCULAR | Status: DC | PRN
  Administered 2019-12-15: 4 mg via INTRAVENOUS

## 2019-12-15 MED ORDER — ROCURONIUM BROMIDE 100 MG/10ML IV SOLN
INTRAVENOUS | Status: DC | PRN
  Administered 2019-12-15: 30 mg via INTRAVENOUS

## 2019-12-15 MED ORDER — OMEPRAZOLE 40 MG PO CPDR
40.0000 mg | DELAYED_RELEASE_CAPSULE | Freq: Two times a day (BID) | ORAL | 0 refills | Status: AC
Start: 2019-12-15 — End: 2022-12-23

## 2019-12-15 MED ORDER — FENTANYL CITRATE (PF) 100 MCG/2ML IJ SOLN: 25.0000 ug | INTRAMUSCULAR | Status: DC | PRN

## 2019-12-15 MED ORDER — LIDOCAINE HCL (CARDIAC) PF 100 MG/5ML IV SOSY
PREFILLED_SYRINGE | INTRAVENOUS | Status: DC | PRN
  Administered 2019-12-15: 60 mg via INTRAVENOUS

## 2019-12-15 MED ORDER — IPRATROPIUM-ALBUTEROL 0.5-2.5 (3) MG/3ML IN SOLN
RESPIRATORY_TRACT | Status: AC
  Filled 2019-12-15: qty 3

## 2019-12-15 MED ORDER — SUCCINYLCHOLINE CHLORIDE 20 MG/ML IJ SOLN
INTRAMUSCULAR | Status: DC | PRN
  Administered 2019-12-15: 100 mg via INTRAVENOUS

## 2019-12-15 MED ORDER — FENTANYL CITRATE (PF) 100 MCG/2ML IJ SOLN
INTRAMUSCULAR | Status: AC
  Filled 2019-12-15: qty 2

## 2019-12-15 MED ORDER — SODIUM CHLORIDE 0.9 % IV SOLN: INTRAVENOUS | Status: DC

## 2019-12-15 MED ORDER — GLUCAGON HCL RDNA (DIAGNOSTIC) 1 MG IJ SOLR
1.0000 mg | Freq: Once | INTRAMUSCULAR | Status: AC
  Administered 2019-12-15: 1 mg via INTRAVENOUS
  Filled 2019-12-15: qty 1

## 2019-12-15 MED ORDER — DEXAMETHASONE SODIUM PHOSPHATE 10 MG/ML IJ SOLN
INTRAMUSCULAR | Status: DC | PRN
  Administered 2019-12-15: 10 mg via INTRAVENOUS

## 2019-12-15 MED ORDER — ONDANSETRON HCL 4 MG/2ML IJ SOLN
4.0000 mg | Freq: Once | INTRAMUSCULAR | Status: AC
  Administered 2019-12-15: 4 mg via INTRAVENOUS
  Filled 2019-12-15: qty 2

## 2019-12-15 MED ORDER — PROPOFOL 10 MG/ML IV BOLUS
INTRAVENOUS | Status: DC | PRN
  Administered 2019-12-15: 90 mg via INTRAVENOUS

## 2019-12-15 NOTE — Progress Notes (Signed)
THis RN spoke with Dr. Andree Elk, patient is able to be discharged home. Per Dr. Andree Elk, Dr. Dione Plover recommendation is for patient to follow up with Dr. Frazier Richards, patient's PCP. Patient and patient grandson, Cassidy [name], informed of doctors request. Both patient and grandson verbally acknowledged recommendation.

## 2019-12-15 NOTE — Progress Notes (Signed)
[facility] hospital liaison note:  Patient is currently followed by [facility] community Palliative program at home. TOC's Teresita Maxey and Andreas Newport made aware.  Flo Shanks BSN, RN, [place] [phone]

## 2019-12-15 NOTE — ED Notes (Signed)
Pt with clear sputum x 3. PO fluids given and encouraged pt to take small sips at a time

## 2019-12-15 NOTE — ED Notes (Signed)
Pt states nausea has improved. Grandson at bedside

## 2019-12-15 NOTE — Transfer of Care (Signed)
Immediate Anesthesia Transfer of Care Note  Patient: Cassidy [name]  Procedure(s) Performed: ESOPHAGOGASTRODUODENOSCOPY (EGD) (N/A )  Patient Location: PACU  Anesthesia Type:General  Level of Consciousness: awake, alert  and oriented  Airway & Oxygen Therapy: Patient Spontanous Breathing and Patient connected to face mask oxygen  Post-op Assessment: Report given to RN and Post -op Vital signs reviewed and stable  Post vital signs: Reviewed and stable  Last Vitals:  Vitals Value Taken Time  BP 130/69 12/15/19 1625  Temp 36.9 C 12/15/19 1625  Pulse 82 12/15/19 1634  Resp 17 12/15/19 1634  SpO2 99 % 12/15/19 1634  Vitals shown include unvalidated device data.  Last Pain:  Vitals:   12/15/19 1625  TempSrc:   PainSc: 0-No pain         Complications: No complications documented.

## 2019-12-15 NOTE — ED Triage Notes (Addendum)
Pt alert and oriented x4 , from home, pt with c/o nausea and vomiting since 6 am today. Pt with history of tortuous esophagus. Pt had not eaten this am before this started. Pt with initial sats of 89% on room air with EMS, pt with sats of 96% on room air in ED. Pt with c/o constant dizziness. States it does not get worse with movement

## 2019-12-15 NOTE — Anesthesia Preprocedure Evaluation (Addendum)
Anesthesia Evaluation  Patient identified by MRN, date of birth, ID band Patient awake    Reviewed: Allergy & Precautions, H&P , NPO status , Patient's Chart, lab work & pertinent test results, reviewed documented beta blocker date and time   History of Anesthesia Complications Negative for: history of anesthetic complications  Airway Mallampati: II  TM Distance: >3 FB Neck ROM: full    Dental  (+) Teeth Intact   Pulmonary neg pulmonary ROS, neg sleep apnea, neg COPD,    Pulmonary exam normal        Cardiovascular (-) angina(-) Past MI and (-) Cardiac Stents negative cardio ROS Normal cardiovascular exam(-) dysrhythmias  Rhythm:regular Rate:Normal     Neuro/Psych PSYCHIATRIC DISORDERS Anxiety Depression Dementia negative neurological ROS  negative psych ROS   GI/Hepatic negative GI ROS, GERD  Medicated,(+) Cirrhosis  (NAFLD)      , Hepatitis -  Endo/Other  negative endocrine ROSHypothyroidism   Renal/GU negative Renal ROS  negative genitourinary   Musculoskeletal   Abdominal   Peds  Hematology negative hematology ROS (+)   Anesthesia Other Findings Past Medical History: No date: Anxiety No date: Depression No date: Fatty liver No date: GERD (gastroesophageal reflux disease) No date: Hypothyroidism No date: Obesity No date: Thyroid disease  Past Surgical History: No date: APPENDECTOMY 02/04/2018: [place]; N/A     Comment:  Procedure: [place];  Surgeon: Ward, Honor Loh, MD;  Location: [facility]               ORS;  Service: Gynecology;  Laterality: N/A; 12/20/2014: ESOPHAGOGASTRODUODENOSCOPY (EGD) WITH PROPOFOL; N/A     Comment:  Procedure: ESOPHAGOGASTRODUODENOSCOPY (EGD) WITH               PROPOFOL;  Surgeon: Manya Silvas, MD;  Location: [facility]              ENDOSCOPY;  Service: Endoscopy;  Laterality:  N/A; 12/20/2014: SAVORY DILATION; N/A     Comment:  Procedure: SAVORY DILATION;  Surgeon: Manya Silvas,               MD;  Location: [facility] ENDOSCOPY;  Service: Endoscopy;                Laterality: N/A; No date: TONSILLECTOMY  BMI    Body Mass Index: 26.64 kg/m      Reproductive/Obstetrics negative OB ROS                           Anesthesia Physical Anesthesia Plan  ASA: III and emergent  Anesthesia Plan: General ETT and Rapid Sequence   Post-op Pain Management:    Induction:   PONV Risk Score and Plan: Ondansetron and Treatment may vary due to age or medical condition  Airway Management Planned:   Additional Equipment:   Intra-op Plan:   Post-operative Plan:   Informed Consent: I have reviewed the patients History and Physical, chart, labs and discussed the procedure including the risks, benefits and alternatives for the proposed anesthesia with the patient or authorized representative who has indicated his/her understanding and acceptance.     Dental Advisory Given  Plan Discussed with: Anesthesiologist, CRNA and Surgeon  Anesthesia Plan Comments: (GETA for food impaction)      Anesthesia Quick Evaluation

## 2019-12-15 NOTE — ED Provider Notes (Signed)
[facility] Emergency Department Provider Note    First MD Initiated Contact with Patient 12/15/19 1133     (approximate)  I have reviewed the triage vital signs and the nursing notes.   HISTORY  Chief Complaint Nausea and Emesis    HPI Cassidy [name] is a 79 y.o. female the below listed past medical history presents to the ER for evaluation of nausea vomiting.  Symptoms started last night of the patient reportedly went to dinner with her sister and grabbed a pole, and peeled shrimp and ate it.  Has not been able to keep anything down since then.  Has a history of a "torturous esophagus ".  No report of any history of esophageal impaction however.  She denies any pain.    Past Medical History:  Diagnosis Date  . Anxiety   . Depression   . Fatty liver   . GERD (gastroesophageal reflux disease)   . Hypothyroidism   . Obesity   . Thyroid disease    Family History  Problem Relation Age of Onset  . Diabetes Mother   . COPD Mother   . Kidney disease Mother   . Depression Mother   . Hypertension Mother   . Heart attack Father   . Aneurysm Father   . Anxiety disorder Sister   . Depression Sister   . Diabetes Sister   . Hypertension Sister   . Atrial fibrillation Brother   . Spinal muscular atrophy Brother   . Breast cancer Sister   . Bone cancer Sister   . Depression Sister   . Anxiety disorder Sister   . Liver cancer Sister   . Breast cancer Sister   . Colon cancer Sister   . Depression Sister   . Diabetes Sister   . COPD Sister   . Depression Sister   . Anxiety disorder Sister   . Skin cancer Sister   . Diabetes Brother   . Depression Brother   . Skin cancer Maternal Aunt   . Diabetes Maternal Aunt   . Cervical cancer Paternal Aunt    Past Surgical History:  Procedure Laterality Date  . APPENDECTOMY    . DILATATION & CURETTAGE/HYSTEROSCOPY WITH MYOSURE N/A 02/04/2018   Procedure: DILATATION & CURETTAGE/HYSTEROSCOPY WITH MYOSURE;   Surgeon: Ward, Honor Loh, MD;  Location: [facility] ORS;  Service: Gynecology;  Laterality: N/A;  . ESOPHAGOGASTRODUODENOSCOPY (EGD) WITH PROPOFOL N/A 12/20/2014   Procedure: ESOPHAGOGASTRODUODENOSCOPY (EGD) WITH PROPOFOL;  Surgeon: Manya Silvas, MD;  Location: [facility] ENDOSCOPY;  Service: Endoscopy;  Laterality: N/A;  . SAVORY DILATION N/A 12/20/2014   Procedure: SAVORY DILATION;  Surgeon: Manya Silvas, MD;  Location: [facility] ENDOSCOPY;  Service: Endoscopy;  Laterality: N/A;  . TONSILLECTOMY     Patient Active Problem List   Diagnosis Date Noted  . MDD (major depressive disorder), recurrent, in full remission ([place]) 09/08/2019  . Insomnia due to mental disorder 09/08/2019  . Major neurocognitive disorder due to multiple etiologies ([place]) 09/08/2019  . Uses walker 11/24/2018  . Dementia arising in the senium and presenium ([place]) 11/24/2018  . General weakness 05/08/2018  . Edema of upper extremity 05/08/2018  . Liver cirrhosis secondary to NASH ([place]) 03/08/2018  . Pleural effusion on right 02/13/2018  . Protein-calorie malnutrition, severe 02/04/2018  . Weight loss 01/19/2017  . Severe recurrent major depression without psychotic features ([place]) 06/19/2016  . Hepatic encephalopathy ([place]) 08/13/2015  . Thrombocytopenia ([place]) 07/23/2015  . Amnesia 03/20/2015  . Major depression in remission ([place])  03/12/2015  . Gonalgia 01/05/2015  . Arthritis 11/27/2014  . Depression, major, recurrent, in partial remission ([place]) 11/27/2014  . Encounter for general adult medical examination without abnormal findings 08/25/2014  . Chronic LBP 08/15/2014  . Complete rotator cuff rupture of left shoulder 05/01/2014  . Infraspinatus tenosynovitis 05/01/2014  . Other synovitis and tenosynovitis, right shoulder 05/01/2014  . Difficulty in walking 11/30/2013  . Arthritis, degenerative 10/01/2013  . Steatohepatitis 10/01/2013  . Orthostasis 10/01/2013  . 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 Fall 09/26/2013  .  Osteoporosis with fracture 09/06/2013  . Clinical depression 03/25/2012  . Adult hypothyroidism 03/25/2012  . Esophageal stenosis 07/03/2009  . Back ache 03/08/2003  . GAD (generalized anxiety disorder) 12/27/2002      Prior to Admission medications   Medication Sig Start Date End Date Taking? Authorizing Provider  busPIRone (BUSPAR) 15 MG tablet TAKE ONE TABLET BY MOUTH EVERY EVENING 11/18/19   Ursula Alert, MD  ciprofloxacin (CIPRO) 250 MG tablet Take 250 mg by mouth 2 (two) times daily. 10/11/19   [provider]  escitalopram (LEXAPRO) 20 MG tablet TAKE ONE TABLET BY MOUTH EVERY DAY 07/22/19   Orlene Erm, MD  fexofenadine (ALLEGRA) 180 MG tablet Take by mouth.    [provider]  fluticasone (FLONASE) 50 MCG/ACT nasal spray TAKE 2 SPRAYS INTO BOTH NOSTRILS TWICE ADAY 06/25/18   [provider]  furosemide (LASIX) 40 MG tablet Take 1 tablet (40 mg total) by mouth daily. 02/19/18   Mayo, Pete Pelt, MD  lactulose [facility]) 10 GM/15ML solution  06/14/19   [provider]  lactulose [facility]) 10 GM/15ML solution Take by mouth. 06/14/19   [provider]  levothyroxine (SYNTHROID) 50 MCG tablet Take 50 mcg by mouth every morning. 05/24/19   [provider]  metroNIDAZOLE (FLAGYL) 250 MG tablet Take 250 mg by mouth 3 (three) times daily. 10/11/19   [provider]  NIFEdipine (PROCARDIA) 10 MG capsule Take by mouth. 01/08/09   [provider]  omeprazole (PRILOSEC) 40 MG capsule Take 1 capsule (40 mg total) by mouth daily. 06/19/17 06/29/18  Earleen Newport, MD  ondansetron (ZOFRAN) 8 MG tablet  06/14/19   [provider]  ondansetron (ZOFRAN-ODT) 8 MG disintegrating tablet Take 8 mg by mouth every 8 (eight) hours as needed for nausea or vomiting.    [provider]  oxybutynin (DITROPAN) 5 MG tablet Take by mouth. 03/06/09   [provider]  QUEtiapine (SEROQUEL) 100 MG tablet Take 1 tablet  (100 mg total) by mouth at bedtime. 09/08/19   Ursula Alert, MD  spironolactone (ALDACTONE) 100 MG tablet Take 1 tablet (100 mg total) by mouth daily. 02/19/18   Mayo, Pete Pelt, MD  traZODone (DESYREL) 150 MG tablet TAKE ONE TABLET BY MOUTH AT BEDTIME 08/23/19   Eappen, Ria Clock, MD  XIFAXAN 550 MG TABS tablet Take 1 tablet by mouth 2 (two) times daily. 10/21/17   [provider]    Allergies Sulfa antibiotics    Social History Social History   Tobacco Use  . Smoking status: Never Smoker  . Smokeless tobacco: Never Used  Vaping Use  . Vaping Use: Never used  Substance Use Topics  . Alcohol use: No  . Drug use: No    Review of Systems Patient denies headaches, rhinorrhea, blurry vision, numbness, shortness of breath, chest pain, edema, cough, abdominal pain, nausea, vomiting, diarrhea, dysuria, fevers, rashes or hallucinations unless otherwise stated above in HPI. ____________________________________________   PHYSICAL EXAM:  VITAL SIGNS: Vitals:   12/15/19 1134  BP: (!) 125/57  Pulse: 82  Resp: (!) 21  Temp: 99.5 F (37.5 C)  SpO2: 96%    Constitutional: Alert  Eyes: Conjunctivae are normal.  Head: Atraumatic. Nose: No congestion/rhinnorhea. Mouth/Throat: Mucous membranes are moist.   Neck: No stridor. Painless ROM.  Cardiovascular: Normal rate, regular rhythm. Grossly normal heart sounds.  Good peripheral circulation. Respiratory: Normal respiratory effort.  No retractions. Lungs CTAB. Gastrointestinal: Soft and nontender. No distention. No abdominal bruits. No CVA tenderness. Genitourinary:  Musculoskeletal: No lower extremity tenderness nor edema.  No joint effusions. Neurologic:  Normal speech and language. No gross focal neurologic deficits are appreciated. No facial droop Skin:  Skin is warm, dry and intact. No rash noted. Psychiatric: Mood and affect are normal. Speech and behavior are normal.  ____________________________________________    LABS (all labs ordered are listed, but only abnormal results are displayed)  Results for orders placed or performed during the hospital encounter of 12/15/19 (from the past 24 hour(s))  CBC     Status: Abnormal   Collection Time: 12/15/19  1:48 AM  Result Value Ref Range   WBC 4.5 4.0 - 10.5 K/uL   RBC 3.84 (L) 3.87 - 5.11 MIL/uL   Hemoglobin 11.8 (L) 12.0 - 15.0 g/dL   HCT 36.1 36 - 46 %   MCV 94.0 80.0 - 100.0 fL   MCH 30.7 26.0 - 34.0 pg   MCHC 32.7 30.0 - 36.0 g/dL   RDW 15.5 11.5 - 15.5 %   Platelets 91 (L) 150 - 400 K/uL   nRBC 0.0 0.0 - 0.2 %  Comprehensive metabolic panel     Status: Abnormal   Collection Time: 12/15/19  1:48 AM  Result Value Ref Range   Sodium 139 135 - 145 mmol/L   Potassium 3.6 3.5 - 5.1 mmol/L   Chloride 99 98 - 111 mmol/L   CO2 30 22 - 32 mmol/L   Glucose, Bld 118 (H) 70 - 99 mg/dL   BUN 9 8 - 23 mg/dL   Creatinine, Ser 1.06 (H) 0.44 - 1.00 mg/dL   Calcium 9.4 8.9 - 10.3 mg/dL   Total Protein 7.2 6.5 - 8.1 g/dL   Albumin 3.7 3.5 - 5.0 g/dL   AST 35 15 - 41 U/L   ALT 19 0 - 44 U/L   Alkaline Phosphatase 80 38 - 126 U/L   Total Bilirubin 1.5 (H) 0.3 - 1.2 mg/dL   GFR calc non Af Amer 50 (L) >60 mL/min   GFR calc Af Amer 58 (L) >60 mL/min   Anion gap 10 5 - 15  Lipase, blood     Status: None   Collection Time: 12/15/19  1:48 AM  Result Value Ref Range   Lipase 31 11 - 51 U/L   ____________________________________________  EKG My review and personal interpretation at Time: 11:37   Indication: n/v  Rate: 80  Rhythm: sinus Axis: normal Other: normal intervals, no stemi ____________________________________________   RADIOLOGY  I personally reviewed all radiographic images ordered to evaluate for the above acute complaints and reviewed radiology reports and findings.  These findings were personally discussed with the patient.  Please see medical record for radiology  report.  ____________________________________________   PROCEDURES  Procedure(s) performed:  Procedures    Critical Care performed: no ____________________________________________   INITIAL IMPRESSION / ASSESSMENT AND PLAN / ED COURSE  Pertinent labs & imaging results that were available during my care of the patient were  reviewed by me and considered in my medical decision making (see chart for details).   DDX: Gastritis, enteritis, food bolus impaction, SBO,  Cassidy [name] is a 79 y.o. who presents to the ED with symptoms as described above.  History is concerning for food bolus impaction.  Her abdominal exam is soft benign.  Will give IV fluids as well as antiemetic and trial of glucagon.  She not complain of any shortness of breath or chest pain.  Clinical Course as of Dec 14 1416  Thu Dec 15, 2019  1255 Patient stone not tolerating p.o. despite glucagon and Zofran.   [PR]  1404 Acute abdominal series without evidence of obstructive pattern.  I do believe her presentation is consistent with food bolus impaction.  She is not hypoxic she not complaining shortness of breath.  May have a component of atelectasis.  Have a lower suspicion for pneumonia or aspiration at this time.  On review of previous radiographs has had effusions and opacities in rll previously.  Discussed case with Dr. Bonna Gains of GI for endoscopy.   [PR]    Clinical Course User Index [PR] Merlyn Lot, MD    The patient was evaluated in Emergency Department today for the symptoms described in the history of present illness. He/she was evaluated in the context of the global COVID-19 pandemic, which necessitated consideration that the patient might be at risk for infection with the SARS-CoV-2 virus that causes COVID-19. Institutional protocols and algorithms that pertain to the evaluation of patients at risk for COVID-19 are in a state of rapid change based on information released by regulatory bodies  including the CDC and federal and state organizations. These policies and algorithms were followed during the patient's care in the ED.  As part of my medical decision making, I reviewed the following data within the [facility] notes reviewed and incorporated, Labs reviewed, notes from prior ED visits and [place] Controlled Substance Database   ____________________________________________   FINAL CLINICAL IMPRESSION(S) / ED DIAGNOSES  Final diagnoses:  Esophageal obstruction due to food impaction      NEW MEDICATIONS STARTED DURING THIS VISIT:  New Prescriptions   No medications on file     Note:  This document was prepared using Dragon voice recognition software and may include unintentional dictation errors.    Merlyn Lot, MD 12/15/19 [phone]

## 2019-12-15 NOTE — ED Triage Notes (Signed)
Pt in with co vomiting that started yesterday, states multiple episodes. Denies any diarrhea, does have some mid abd pain. No dysuria.

## 2019-12-15 NOTE — Anesthesia Procedure Notes (Signed)
Procedure Name: Intubation Date/Time: 12/15/2019 4:03 PM Performed by: Nelda Marseille, CRNA Pre-anesthesia Checklist: Patient identified, Patient being monitored, Timeout performed, Emergency Drugs available and Suction available Patient Re-evaluated:Patient Re-evaluated prior to induction Oxygen Delivery Method: Circle system utilized Preoxygenation: Pre-oxygenation with 100% oxygen Induction Type: IV induction Ventilation: Mask ventilation without difficulty Laryngoscope Size: Mac, 3 and McGraph Grade View: Grade I Tube type: Oral Tube size: 7.0 mm Number of attempts: 1 Airway Equipment and Method: Stylet Placement Confirmation: ETT inserted through vocal cords under direct vision,  positive ETCO2 and breath sounds checked- equal and bilateral Secured at: 21 cm Tube secured with: Tape Dental Injury: Teeth and Oropharynx as per pre-operative assessment

## 2019-12-15 NOTE — Consult Note (Signed)
Vonda Antigua, MD [address] [place], [place], [place], [address] [address], [place], [place], [place], [address] Phone: [phone]  Fax: [phone]  Consultation  Referring Provider:     Dr. Quentin Cornwall Primary Care Physician:  Kirk Ruths, MD Reason for Consultation:   Food impaction  Date of Admission:  12/15/2019 Date of Consultation:  12/15/2019         HPI:   Cassidy [name] is a 79 y.o. female with previous history of intermittent dysphagia, with inability to swallow solids or liquids since 6:30 PM yesterday when she swallowed a whole shrimp.  Patient has been given water and other liquids since then and has been throwing those up.  Last upper endoscopy in 2016 with Schatzki's ring dilated at the time  Past Medical History:  Diagnosis Date  . Anxiety   . Depression   . Fatty liver   . GERD (gastroesophageal reflux disease)   . Hypothyroidism   . Obesity   . Thyroid disease     Past Surgical History:  Procedure Laterality Date  . APPENDECTOMY    . DILATATION & CURETTAGE/HYSTEROSCOPY WITH MYOSURE N/A 02/04/2018   Procedure: DILATATION & CURETTAGE/HYSTEROSCOPY WITH MYOSURE;  Surgeon: Ward, Honor Loh, MD;  Location: [facility] ORS;  Service: Gynecology;  Laterality: N/A;  . ESOPHAGOGASTRODUODENOSCOPY (EGD) WITH PROPOFOL N/A 12/20/2014   Procedure: ESOPHAGOGASTRODUODENOSCOPY (EGD) WITH PROPOFOL;  Surgeon: Manya Silvas, MD;  Location: [facility] ENDOSCOPY;  Service: Endoscopy;  Laterality: N/A;  . SAVORY DILATION N/A 12/20/2014   Procedure: SAVORY DILATION;  Surgeon: Manya Silvas, MD;  Location: [facility] ENDOSCOPY;  Service: Endoscopy;  Laterality: N/A;  . TONSILLECTOMY      Prior to Admission medications   Medication Sig Start Date End Date Taking? Authorizing Provider  busPIRone (BUSPAR) 15 MG tablet TAKE ONE TABLET BY MOUTH EVERY EVENING 11/18/19   Ursula Alert, MD  ciprofloxacin (CIPRO) 250 MG tablet Take 250 mg by mouth 2 (two) times daily. 10/11/19    [provider]  escitalopram (LEXAPRO) 20 MG tablet TAKE ONE TABLET BY MOUTH EVERY DAY 07/22/19   Orlene Erm, MD  fexofenadine (ALLEGRA) 180 MG tablet Take by mouth.    [provider]  fluticasone (FLONASE) 50 MCG/ACT nasal spray TAKE 2 SPRAYS INTO BOTH NOSTRILS TWICE ADAY 06/25/18   [provider]  furosemide (LASIX) 40 MG tablet Take 1 tablet (40 mg total) by mouth daily. 02/19/18   Mayo, Pete Pelt, MD  lactulose [facility]) 10 GM/15ML solution  06/14/19   [provider]  lactulose [facility]) 10 GM/15ML solution Take by mouth. 06/14/19   [provider]  levothyroxine (SYNTHROID) 50 MCG tablet Take 50 mcg by mouth every morning. 05/24/19   [provider]  metroNIDAZOLE (FLAGYL) 250 MG tablet Take 250 mg by mouth 3 (three) times daily. 10/11/19   [provider]  NIFEdipine (PROCARDIA) 10 MG capsule Take by mouth. 01/08/09   [provider]  omeprazole (PRILOSEC) 40 MG capsule Take 1 capsule (40 mg total) by mouth daily. 06/19/17 06/29/18  Earleen Newport, MD  ondansetron (ZOFRAN) 8 MG tablet  06/14/19   [provider]  ondansetron (ZOFRAN-ODT) 8 MG disintegrating tablet Take 8 mg by mouth every 8 (eight) hours as needed for nausea or vomiting.    [provider]  oxybutynin (DITROPAN) 5 MG tablet Take by mouth. 03/06/09   [provider]  QUEtiapine (SEROQUEL) 100 MG tablet Take 1 tablet (100 mg total) by mouth at  bedtime. 09/08/19   Ursula Alert, MD  spironolactone (ALDACTONE) 100 MG tablet Take 1 tablet (100 mg total) by mouth daily. 02/19/18   Mayo, Pete Pelt, MD  traZODone (DESYREL) 150 MG tablet TAKE ONE TABLET BY MOUTH AT BEDTIME 08/23/19   Eappen, Ria Clock, MD  XIFAXAN 550 MG TABS tablet Take 1 tablet by mouth 2 (two) times daily. 10/21/17   [provider]    Family History  Problem Relation Age of Onset  . Diabetes Mother   . COPD Mother   . Kidney disease Mother   .  Depression Mother   . Hypertension Mother   . Heart attack Father   . Aneurysm Father   . Anxiety disorder Sister   . Depression Sister   . Diabetes Sister   . Hypertension Sister   . Atrial fibrillation Brother   . Spinal muscular atrophy Brother   . Breast cancer Sister   . Bone cancer Sister   . Depression Sister   . Anxiety disorder Sister   . Liver cancer Sister   . Breast cancer Sister   . Colon cancer Sister   . Depression Sister   . Diabetes Sister   . COPD Sister   . Depression Sister   . Anxiety disorder Sister   . Skin cancer Sister   . Diabetes Brother   . Depression Brother   . Skin cancer Maternal Aunt   . Diabetes Maternal Aunt   . Cervical cancer Paternal Aunt      Social History   Tobacco Use  . Smoking status: Never Smoker  . Smokeless tobacco: Never Used  Vaping Use  . Vaping Use: Never used  Substance Use Topics  . Alcohol use: No  . Drug use: No    Allergies as of 12/15/2019 - Review Complete 12/15/2019  Allergen Reaction Noted  . Sulfa antibiotics Shortness Of Breath 11/27/2014    Review of Systems:    All systems reviewed and negative except where noted in HPI.   Physical Exam:  Vital signs in last 24 hours: Vitals:   12/15/19 1200 12/15/19 1300 12/15/19 1330 12/15/19 1417  BP: 113/64 132/72 128/60 132/65  Pulse: 88 88 79 83  Resp: 19 18 (!) 22 (!) 22  Temp:      TempSrc:      SpO2: 98% 96% 94% 94%  Weight:      Height:         General:   Pleasant, cooperative in NAD Head:  Normocephalic and atraumatic. Eyes:   No icterus.   Conjunctiva pink. PERRLA. Ears:  Normal auditory acuity. Neck:  Supple; no masses or thyroidomegaly Lungs: Respirations even and unlabored. Lungs clear to auscultation bilaterally.   No wheezes, crackles, or rhonchi.  Abdomen:  Soft, nondistended, nontender. Normal bowel sounds. No appreciable masses or hepatomegaly.  No rebound or guarding.  Neurologic:  Alert and oriented x3;  grossly normal  neurologically. Skin:  Intact without significant lesions or rashes. Cervical Nodes:  No significant cervical adenopathy. Psych:  Alert and cooperative. Normal affect.  LAB RESULTS: Recent Labs    12/15/19 0148 12/15/19 1223  WBC 4.5 5.2  HGB 11.8* 11.5*  HCT 36.1 33.7*  PLT 91* 78*   BMET Recent Labs    12/15/19 0148 12/15/19 1223  NA 139 137  K 3.6 3.6  CL 99 101  CO2 30 27  GLUCOSE 118* 103*  BUN 9 11  CREATININE 1.06* 1.06*  CALCIUM 9.4 9.0   LFT Recent Labs  12/15/19 1223  PROT 6.8  ALBUMIN 3.5  AST 30  ALT 17  ALKPHOS 75  BILITOT 1.9*   PT/INR No results for input(s): LABPROT, INR in the last 72 hours.  STUDIES: DG Abdomen Acute W/Chest  Result Date: 12/15/2019 CLINICAL DATA:  Abdominal pain with nausea and vomiting EXAM: DG ABDOMEN ACUTE W/ 1V CHEST COMPARISON:  02/10/2018, 11/20/2017 FINDINGS: Stable cardiomegaly. Atherosclerotic calcification of the aortic knob. Hazy airspace opacity within the right mid lung. Chronic small to moderate loculated appearing right-sided pleural effusion with apical and basilar components. Bowel gas pattern is nonobstructive. No definite pathologic calcification although there are scattered tiny punctate foci which are favored to be located within bowel. No free intraperitoneal air. There is a chronic compression fracture of the L2 vertebral body. S shaped thoracolumbar scoliotic curvature. IMPRESSION: 1. Hazy airspace opacity within the right mid lung concerning for pneumonia. 2. Chronic small to moderate loculated appearing right-sided pleural effusion. 3. Nonobstructive bowel gas pattern. Electronically Signed   By: Davina Poke D.O.   On: 12/15/2019 13:23      Impression / Plan:   Cassidy [name] is a 79 y.o. y/o female with food impaction after swallowing a whole shrimp yesterday at 6:30 PM  Proceed with upper endoscopy today for removal of food bolus  I have discussed alternative options, risks & benefits,  which  include, but are not limited to, bleeding, infection, perforation,respiratory complication & drug reaction.  The patient agrees with this plan & written consent will be obtained.     Thank you for involving me in the care of this patient.      LOS: 0 days   Virgel Manifold, MD  12/15/2019, 2:28 PM

## 2019-12-16 ENCOUNTER — Encounter: Payer: Self-pay | Admitting: Gastroenterology

## 2019-12-16 NOTE — Op Note (Signed)
[facility] Patient Name: Cassidy [name] Procedure Date: 12/15/2019 3:50 PM MRN: [id number] Account #: [id number] Date of Birth: 12/11/1940 Admit Type: Outpatient Age: 79 Room: [facility] ENDO ROOM 1 Gender: Female Note Status: Finalized Procedure:             Upper GI endoscopy Indications:           Foreign body in the esophagus Providers:             Elishua Radford B. Bonna Gains MD, MD Referring MD:          Forest Gleason Md, MD (Referring MD) Medicines:             Monitored Anesthesia Care Complications:         No immediate complications. Procedure:             Pre-Anesthesia Assessment:                        - The risks and benefits of the procedure and the                         sedation options and risks were discussed with the                         patient. All questions were answered and informed                         consent was obtained.                        - Patient identification and proposed procedure were                         verified prior to the procedure.                        - ASA Grade Assessment: II - A patient with mild                         systemic disease.                        After obtaining informed consent, the endoscope was                         passed under direct vision. Throughout the procedure,                         the patient's blood pressure, pulse, and oxygen                         saturations were monitored continuously. The Endoscope                         was introduced through the mouth, and advanced to the                         second part of duodenum. The upper GI endoscopy was  accomplished with ease. The patient tolerated the                         procedure well. Findings:      Food was found in the distal esophagus. Removal of food was accomplished.      Patchy moderate erythema was found in the distal esophagus.      Patchy mildly erythematous mucosa without bleeding  was found in the       gastric antrum.      The exam of the stomach was otherwise normal.      The examined duodenum was normal. Impression:            - Food in the distal esophagus. Removal was successful.                        - Erythema in the distal esophagus.                        - Erythematous mucosa in the antrum.                        - Normal examined duodenum. Recommendation:        - Use Protonix (pantoprazole) 40 mg PO BID for 6 weeks.                        - Return to my office in 4 weeks.                        - Discuss repeat EGD at follow up appointment                        - Soft diet for 1 week.                        - The findings and recommendations were discussed with                         the patient.                        - The findings and recommendations were discussed with                         the patient's family. Procedure Code(s):     --- Professional ---                        [phone], Esophagogastroduodenoscopy, flexible,                         transoral; with removal of foreign body(s) Diagnosis Code(s):     --- Professional ---                        Z48.270B, Food in esophagus causing other injury,                         initial encounter                        K22.8, Other specified diseases  of esophagus                        K31.89, Other diseases of stomach and duodenum                        T18.108A, Unspecified foreign body in esophagus                         causing other injury, initial encounter CPT copyright 2019 American Medical Association. All rights reserved. The codes documented in this report are preliminary and upon coder review may  be revised to meet current compliance requirements.  Vonda Antigua, MD Margretta Sidle B. Bonna Gains MD, MD 12/15/2019 4:20:56 PM This report has been signed electronically. Number of Addenda: 0 Note Initiated On: 12/15/2019 3:50 PM Total Procedure Duration: 0 hours 11 minutes 29 seconds   Estimated Blood Loss:  Estimated blood loss: none.      [facility]

## 2019-12-19 ENCOUNTER — Telehealth: Payer: Self-pay | Admitting: Gastroenterology

## 2019-12-19 NOTE — Telephone Encounter (Signed)
Patient's POA CassidyHarrison expressed how she was upset about how someone at the front office didn't express any empathy for her sister when she tried to explain that her sister's condition was an emergency. She(POA) was trying to schedule an appointment with Dr. Marius Ditch at that time when she called for the patient Cassidy [name]. Ms. Aline Brochure stated that she will inform Dr. Marius Ditch and Dr. Bonna Gains of how she was treated.. Per POA Ms. Aline Brochure.

## 2019-12-28 NOTE — Anesthesia Postprocedure Evaluation (Signed)
Anesthesia Post Note  Patient: Cassidy [name]  Procedure(s) Performed: ESOPHAGOGASTRODUODENOSCOPY (EGD) (N/A )  Patient location during evaluation: PACU Anesthesia Type: General Level of consciousness: awake and alert Pain management: pain level controlled Vital Signs Assessment: post-procedure vital signs reviewed and stable Respiratory status: spontaneous breathing, nonlabored ventilation, respiratory function stable and patient connected to nasal cannula oxygen Cardiovascular status: blood pressure returned to baseline and stable Postop Assessment: no apparent nausea or vomiting Anesthetic complications: no   No complications documented.   Last Vitals:  Vitals:   12/15/19 1855 12/15/19 1909  BP: (!) 117/59 120/62  Pulse: 95 96  Resp: (!) 30 17  Temp:  36.8 C  SpO2: 93% 96%    Last Pain:  Vitals:   12/15/19 1909  TempSrc:   PainSc: 0-No pain                 Molli Barrows

## 2020-01-18 ENCOUNTER — Telehealth: Payer: Self-pay

## 2020-01-18 NOTE — Telephone Encounter (Signed)
Phone call placed to patient's daughter to check in on patient and offer to schedule a visit with Palliative care NP. Daughter reported that patient is doing ok at this time and is scheduled to see her PCP. Daughter will call with changes or concerns.

## 2020-01-20 ENCOUNTER — Telehealth: Payer: Self-pay | Admitting: Gastroenterology

## 2020-01-20 NOTE — Telephone Encounter (Signed)
-----   Message from Virgel Manifold, MD sent at 12/16/2019 11:35 AM EDT -----  Please set up clinic appointment with me in 4 weeks

## 2020-01-20 NOTE — Telephone Encounter (Signed)
Patient scheduled for ov with Dr. Darene Lamer on 10.6.21

## 2020-01-25 ENCOUNTER — Ambulatory Visit (INDEPENDENT_AMBULATORY_CARE_PROVIDER_SITE_OTHER): Payer: Medicare Other | Admitting: Gastroenterology

## 2020-01-25 ENCOUNTER — Encounter: Payer: Self-pay | Admitting: Gastroenterology

## 2020-01-25 ENCOUNTER — Other Ambulatory Visit: Payer: Self-pay | Admitting: Child and Adolescent Psychiatry

## 2020-01-25 ENCOUNTER — Ambulatory Visit: Payer: Medicare Other | Admitting: Gastroenterology

## 2020-01-25 ENCOUNTER — Other Ambulatory Visit: Payer: Self-pay

## 2020-01-25 VITALS — BP 111/72 | HR 85 | Temp 98.5°F | Ht 62.0 in | Wt 146.2 lb

## 2020-01-25 DIAGNOSIS — F411 Generalized anxiety disorder: Secondary | ICD-10-CM

## 2020-01-25 DIAGNOSIS — K7290 Hepatic failure, unspecified without coma: Secondary | ICD-10-CM

## 2020-01-25 DIAGNOSIS — J948 Other specified pleural conditions: Secondary | ICD-10-CM | POA: Diagnosis not present

## 2020-01-25 DIAGNOSIS — K7460 Unspecified cirrhosis of liver: Secondary | ICD-10-CM | POA: Diagnosis not present

## 2020-01-25 DIAGNOSIS — T18128D Food in esophagus causing other injury, subsequent encounter: Secondary | ICD-10-CM | POA: Diagnosis not present

## 2020-01-25 DIAGNOSIS — F330 Major depressive disorder, recurrent, mild: Secondary | ICD-10-CM

## 2020-01-25 NOTE — Progress Notes (Signed)
Cephas Darby, MD [address]  [place]  [place], [place] [address]  Main: [phone]  Fax: [phone]    Gastroenterology Consultation  Referring Provider:     Kirk Ruths, MD Primary Care Physician:  Kirk Ruths, MD Primary Gastroenterologist:  Dr. Cephas Darby Reason for Consultation:     Hepatic hydrothorax, decompensated cirrhosis, choking episodes        HPI:   Cassidy [name] is a 79 y.o. female referred by Dr. Ouida Sills, Ocie Cornfield, MD  for consultation & management of recent episode of food impaction.  Patient went to [facility] on 8/26 after she swallowed whole shrimp when she went outside with her sister.  She went to ER right away, did not get endoscopy with removal of food bolus.  There was evidence of irritation in distal esophagus.  No obvious stricture noted.  She has been started on omeprazole 40 mg twice daily.  Apparently, patient does not like to wear dentures even though they fit her well.  According to patient's sister, she does not to.  She likes to swallow everything.  Since last episode, she had another choking episode when she tried to eat pickled beets.  The symptoms lasted for 3 hours and spontaneously passed down at home.  Patient's sister is having hard time managing her diet, with restrictive eating, preparing her food.  She does not have any help at home except from her husband.  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  Her hemoglobin is stable.  She denies any other GI symptoms.  Patient has history of obesity, fatty liver, hypothyroidism with chronic liver disease, splenomegaly, chronic thrombocytopenia and portal hypertension   NSAIDs: None  Antiplts/Anticoagulants/Anti thrombotics: None  GI Procedures: She had several EGDs and colonoscopies in the past.  No known varices or portal hypertensive gastropathy Biopsies negative for H. pylori infection  EGD 12/15/2019 - Food in  the distal esophagus. Removal was successful. - Erythema in the distal esophagus. - Erythematous mucosa in the antrum. - Normal examined duodenum.  EGD 12/20/2014 - Moderate Schatzki ring. Dilated. - Erythematous mucosa in the antrum. Biopsied. - Normal examined duodenum. DIAGNOSIS:  A. STOMACH; COLD BIOPSY:  - ANTRAL MUCOSA WITH FEATURES OF REACTIVE GASTROPATHY, SEE NOTE.  - OXYNTIC MUCOSA WITH FOCAL MINIMAL CHRONIC GASTRITIS.  - NEGATIVE FOR H. PYLORI, DYSPLASIA, AND MALIGNANCY  Past Medical History:  Diagnosis Date  . Anxiety   . Depression   . Fatty liver   . GERD (gastroesophageal reflux disease)   . Hypothyroidism   . Obesity   . Thyroid disease     Past Surgical History:  Procedure Laterality Date  . APPENDECTOMY    . DILATATION & CURETTAGE/HYSTEROSCOPY WITH MYOSURE N/A 02/04/2018   Procedure: DILATATION & CURETTAGE/HYSTEROSCOPY WITH MYOSURE;  Surgeon: Ward, Honor Loh, MD;  Location: [facility] ORS;  Service: Gynecology;  Laterality: N/A;  . ESOPHAGOGASTRODUODENOSCOPY N/A 12/15/2019   Procedure: ESOPHAGOGASTRODUODENOSCOPY (EGD);  Surgeon: Virgel Manifold, MD;  Location: [facility] ENDOSCOPY;  Service: Endoscopy;  Laterality: N/A;  . ESOPHAGOGASTRODUODENOSCOPY (EGD) WITH PROPOFOL N/A 12/20/2014   Procedure: ESOPHAGOGASTRODUODENOSCOPY (EGD) WITH PROPOFOL;  Surgeon: Manya Silvas, MD;  Location: [facility] ENDOSCOPY;  Service: Endoscopy;  Laterality: N/A;  . SAVORY DILATION N/A 12/20/2014   Procedure: SAVORY DILATION;  Surgeon: Manya Silvas, MD;  Location: [facility] ENDOSCOPY;  Service: Endoscopy;  Laterality: N/A;  . TONSILLECTOMY       Current Outpatient Medications:  .  aspirin-acetaminophen-caffeine (EXCEDRIN MIGRAINE) 250-250-65 MG tablet, Take 1 tablet by mouth every 6 (six) hours as needed., Disp: , Rfl:  .  azelastine (ASTELIN) 0.1 % nasal spray, Place 1 spray into both nostrils 2 (two) times daily. Use in each nostril as directed, Disp: , Rfl:  .  busPIRone (BUSPAR) 15 MG  tablet, TAKE ONE TABLET BY MOUTH EVERY EVENING (Patient taking differently: Take 15 mg by mouth 2 (two) times daily. ), Disp: 90 tablet, Rfl: 1 .  escitalopram (LEXAPRO) 20 MG tablet, TAKE ONE TABLET BY MOUTH EVERY DAY, Disp: 90 tablet, Rfl: 1 .  fexofenadine (ALLEGRA) 180 MG tablet, Take by mouth., Disp: , Rfl:  .  furosemide (LASIX) 40 MG tablet, Take 1 tablet (40 mg total) by mouth daily. (Patient taking differently: TAKE ONE TABLET BY MOUTH ONCE DAILY AS NEEDED FOR EDEMA (USE DAILY AND THE SECOND DOSE AS NEEDED)), Disp: 30 tablet, Rfl: 0 .  lactulose (CHRONULAC) 10 GM/15ML solution, Take 10 g by mouth 2 (two) times daily., Disp: , Rfl:  .  levothyroxine (SYNTHROID) 50 MCG tablet, Take 50 mcg by mouth every morning., Disp: , Rfl:  .  Multiple Vitamin (MULTIVITAMIN WITH MINERALS) TABS tablet, Take 1 tablet by mouth daily., Disp: , Rfl:  .  omeprazole (PRILOSEC) 40 MG capsule, Take 1 capsule (40 mg total) by mouth 2 (two) times daily., Disp: 90 capsule, Rfl: 0 .  ondansetron (ZOFRAN-ODT) 4 MG disintegrating tablet, Take 4 mg by mouth every 8 (eight) hours as needed for nausea or vomiting., Disp: , Rfl:  .  QUEtiapine (SEROQUEL) 100 MG tablet, Take 1 tablet (100 mg total) by mouth at bedtime., Disp: 90 tablet, Rfl: 1 .  spironolactone (ALDACTONE) 100 MG tablet, Take 1 tablet (100 mg total) by mouth daily., Disp: 30 tablet, Rfl: 0 .  traZODone (DESYREL) 150 MG tablet, TAKE ONE TABLET BY MOUTH AT BEDTIME, Disp: 90 tablet, Rfl: 2 .  XIFAXAN 550 MG TABS tablet, Take 1 tablet by mouth 2 (two) times daily., Disp: , Rfl:   Family History  Problem Relation Age of Onset  . Diabetes Mother   . COPD Mother   . Kidney disease Mother   . Depression Mother   . Hypertension Mother   . Heart attack Father   . Aneurysm Father   . Anxiety disorder Sister   . Depression Sister   . Diabetes Sister   . Hypertension Sister   . Atrial fibrillation Brother   . Spinal muscular atrophy Brother   . Breast cancer  Sister   . Bone cancer Sister   . Depression Sister   . Anxiety disorder Sister   . Liver cancer Sister   . Breast cancer Sister   . Colon cancer Sister   . Depression Sister   . Diabetes Sister   . COPD Sister   . Depression Sister   . Anxiety disorder Sister   . Skin cancer Sister   . Diabetes Brother   . Depression Brother   . Skin cancer Maternal Aunt   . Diabetes Maternal Aunt   . Cervical cancer Paternal Aunt      Social History   Tobacco Use  . Smoking status: Never Smoker  . Smokeless tobacco: Never Used  Vaping Use  . Vaping Use: Never used  Substance Use Topics  . Alcohol use: No  . Drug use: No    Allergies as of 01/25/2020 - Review Complete 01/25/2020  Allergen Reaction Noted  . Sulfa antibiotics Shortness Of Breath 11/27/2014  Review of Systems:    All systems reviewed and negative except where noted in HPI.   Physical Exam:  BP 111/72 (BP Location: Left Arm, Patient Position: Sitting, Cuff Size: Normal)   Pulse 85   Temp 98.5 F (36.9 C) (Oral)   Ht 5' 2"  (1.575 m)   Wt 146 lb 4 oz (66.3 kg)   LMP  (LMP Unknown)   BMI 26.75 kg/m  No LMP recorded (lmp unknown). Patient is postmenopausal.  General:   Alert, moderately built, moderately nourished, pleasant and cooperative in NAD Head:  Normocephalic and atraumatic. Eyes:  Sclera clear, no icterus.   Conjunctiva pink. Ears:  Normal auditory acuity. Nose:  No deformity, discharge, or lesions. Mouth:  No deformity or lesions,oropharynx pink & moist. Neck:  Supple; no masses or thyromegaly. Lungs:  Respirations even and unlabored.  Clear throughout to auscultation.   No wheezes, crackles, or rhonchi. No acute distress. Heart:  Regular rate and rhythm; no murmurs, clicks, rubs, or gallops. Abdomen:  Normal bowel sounds. Soft, non-tender and non-distended without masses, hepatosplenomegaly or hernias noted.  No guarding or rebound tenderness.   Rectal: Not performed Msk:  Symmetrical without gross  deformities. Good, equal movement & strength bilaterally. Pulses:  Normal pulses noted. Extremities:  No clubbing or edema.  No cyanosis. Neurologic:  Alert and oriented x2;  grossly normal neurologically. Skin:  Intact without significant lesions or rashes. No jaundice.  Spontaneous bruises on her bilateral upper extremities Psych:  Alert and cooperative. Normal mood and affect.  Imaging Studies: Reviewed  Assessment and Plan:   Cassidy [name] is a 79 y.o. Caucasian female with obesity, fatty liver, hypothyroidism with chronic liver disease, splenomegaly, chronic thrombocytopenia and portal hypertension with recurrent right-sided pleural effusions, status post therapeutic thoracentesis in 01/2018.  Fluid analysis consistent with hepatic hydrothorax, serum-to-pleural fluid albumin gradient >1.1 Also, recent food impaction s/p EGD with food bolus removal in 11/2019  Food impaction: Most likely secondary to lack of dentures Patient is not willing to make dentures Reiterated on modified diet, strongly advised on pured food, mechanical soft diet, discussed about different options EGD revealed esophagitis, recommend to continue omeprazole 40 mg, decreased to once a day Follow-up with me in 4 weeks to reassess the need for upper endoscopy  Chronic liver disease with portal hypertension: There is no evidence of nodularity of the liver based on imaging 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  EGD on 11/2019 did not reveal any evidence of varices PSE: Continue rifaximin, MiraLAX 1 to 2 cups daily to have at least 2 soft bowel movements per day [place] screening: No evidence of liver lesions based on the CT scan in 01/2018 Recommend right upper quadrant ultrasound for  [facility] screening  Discussed with patient's sister who is her caregiver to seek help with palliative care and discuss with them regarding home Health services   Follow up in 1 month  Cephas Darby, MD

## 2020-01-26 NOTE — Telephone Encounter (Signed)
Dr Charlcie Cradle pt

## 2020-01-27 ENCOUNTER — Other Ambulatory Visit: Payer: Self-pay

## 2020-01-27 ENCOUNTER — Encounter: Payer: Self-pay | Admitting: Psychiatry

## 2020-01-27 ENCOUNTER — Telehealth (INDEPENDENT_AMBULATORY_CARE_PROVIDER_SITE_OTHER): Payer: Medicare Other | Admitting: Psychiatry

## 2020-01-27 DIAGNOSIS — F411 Generalized anxiety disorder: Secondary | ICD-10-CM | POA: Diagnosis not present

## 2020-01-27 DIAGNOSIS — F5105 Insomnia due to other mental disorder: Secondary | ICD-10-CM | POA: Diagnosis not present

## 2020-01-27 DIAGNOSIS — F0280 Dementia in other diseases classified elsewhere without behavioral disturbance: Secondary | ICD-10-CM | POA: Diagnosis not present

## 2020-01-27 DIAGNOSIS — F3342 Major depressive disorder, recurrent, in full remission: Secondary | ICD-10-CM | POA: Diagnosis not present

## 2020-01-27 MED ORDER — LORAZEPAM 0.5 MG PO TABS: 0.5000 mg | ORAL_TABLET | Freq: Every day | ORAL | 1 refills | Status: DC | PRN

## 2020-01-27 NOTE — Progress Notes (Signed)
Provider Location : [facility] Patient Location : Home  Participants: Patient ,Laureen Ochs, Provider  Virtual Visit via Telephone Note  I connected with Cassidy [name] on 01/27/20 at 11:30 AM EDT by telephone and verified that I am speaking with the correct person using two identifiers.   I discussed the limitations, risks, security and privacy concerns of performing an evaluation and management service by telephone and the availability of in person appointments. I also discussed with the patient that there may be a patient responsible charge related to this service. The patient expressed understanding and agreed to proceed.    I discussed the assessment and treatment plan with the patient. The patient was provided an opportunity to ask questions and all were answered. The patient agreed with the plan and demonstrated an understanding of the instructions.   The patient was advised to call back or seek an in-person evaluation if the symptoms worsen or if the condition fails to improve as anticipated.   [place] MD OP Progress Note  01/27/2020 11:50 AM Cassidy [name]  MRN:  [id number]  Chief Complaint:  Chief Complaint    Follow-up     HPI: Cassidy [name] is a 79 year old Caucasian female who has a history of MDD, GAD, insomnia, panic attacks, thrombocytopenia, hypothyroidism, chronic liver disease with portal hypertension, gastroesophageal reflux disease, dementia was evaluated by phone today.  Patient being a limited historian majority of information was obtained from Laureen Ochs who is the primary caretaker.  Patient today appeared to be alert and oriented only to self.  She tried to answer questions however she could only talk in one-word phrases.  According to [facility] patient is having difficulty swallowing food.  She had a choking episode recently and she was taken to the emergency department.  They are trying to give her very soft pured food at this time.  In spite of that she continues to have  difficulty.  She is trying to get in touch with palliative care to get her help.  However according to sister she just recently lost another sister and they are currently having a hard time.  According to [facility] patient however is doing okay.  She does have good days and bad days however overall coping okay.  She is compliant on medications.  Denies any other concerns today.  Visit Diagnosis:    ICD-10-CM   1. MDD (major depressive disorder), recurrent, in full remission ([place])  F33.42   2. GAD (generalized anxiety disorder)  F41.1 LORazepam (ATIVAN) 0.5 MG tablet  3. Insomnia due to mental disorder  F51.05   4. 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 Anxiety   . Depression   . Fatty liver   . GERD (gastroesophageal reflux disease)   . Hypothyroidism   . Obesity   . Thyroid disease     Past Surgical History:  Procedure Laterality Date  . APPENDECTOMY    . DILATATION & CURETTAGE/HYSTEROSCOPY WITH MYOSURE N/A 02/04/2018   Procedure: DILATATION & CURETTAGE/HYSTEROSCOPY WITH MYOSURE;  Surgeon: Ward, Honor Loh, MD;  Location: [facility] ORS;  Service: Gynecology;  Laterality: N/A;  . ESOPHAGOGASTRODUODENOSCOPY N/A 12/15/2019   Procedure: ESOPHAGOGASTRODUODENOSCOPY (EGD);  Surgeon: Virgel Manifold, MD;  Location: [facility] ENDOSCOPY;  Service: Endoscopy;  Laterality: N/A;  . ESOPHAGOGASTRODUODENOSCOPY (EGD) WITH PROPOFOL N/A 12/20/2014  Procedure: ESOPHAGOGASTRODUODENOSCOPY (EGD) WITH PROPOFOL;  Surgeon: Manya Silvas, MD;  Location: [facility] ENDOSCOPY;  Service: Endoscopy;  Laterality: N/A;  . SAVORY DILATION N/A 12/20/2014   Procedure: SAVORY DILATION;  Surgeon: Manya Silvas, MD;  Location: [facility] ENDOSCOPY;  Service: Endoscopy;  Laterality: N/A;  . TONSILLECTOMY      Family Psychiatric  History: I have reviewed family psychiatric history from my progress note on 06/23/2017  Family History:  Family History  Problem Relation Age of Onset  . Diabetes Mother   . COPD Mother   . Kidney disease Mother   . Depression Mother   . Hypertension Mother   . Heart attack Father   . Aneurysm Father   . Anxiety disorder Sister   . Depression Sister   . Diabetes Sister   . Hypertension Sister   . Atrial fibrillation Brother   . Spinal muscular atrophy Brother   . Breast cancer Sister   . Bone cancer Sister   . Depression Sister   . Anxiety disorder Sister   . Liver cancer Sister   . Breast cancer Sister   . Colon cancer Sister   . Depression Sister   . Diabetes Sister   . COPD Sister   . Depression Sister   . Anxiety disorder Sister   . Skin cancer Sister   . Diabetes Brother   . Depression Brother   . Skin cancer Maternal Aunt   . Diabetes Maternal Aunt   . Cervical cancer Paternal Aunt     Social History: I have reviewed social history from my progress note on 06/23/2017 Social History   Socioeconomic History  . Marital status: Widowed    Spouse name: Not on file  . Number of children: 3  . Years of education: Not on file  . Highest education level: Not on file  Occupational History  . Not on file  Tobacco Use  . Smoking status: Never Smoker  . Smokeless tobacco: Never Used  Vaping Use  . Vaping Use: Never used  Substance and Sexual Activity  . Alcohol use: No  . Drug use: No  . Sexual activity: Not Currently    Birth control/protection: Post-menopausal  Other Topics Concern  . Not on file  Social History Narrative  . Not on file   Social Determinants of Health   Financial Resource Strain:   . Difficulty of Paying Living Expenses: Not on file  Food Insecurity:   . Worried About Charity fundraiser in the Last Year: Not on file  . Ran Out of Food in the Last Year: Not on file  Transportation Needs:   . Lack of Transportation (Medical): Not on file   . Lack of Transportation (Non-Medical): Not on file  Physical Activity:   . Days of Exercise per Week: Not on file  . Minutes of Exercise per Session: Not on file  Stress:   . Feeling of Stress : Not on file  Social Connections:   . Frequency of Communication with Friends and Family: Not on file  . Frequency of Social Gatherings with Friends and Family: Not on file  . Attends Religious Services: Not on file  . Active Member of Clubs or Organizations: Not on file  . Attends Archivist Meetings: Not on file  . Marital Status: Not on file    Allergies:  Allergies  Allergen Reactions  . Sulfa Antibiotics Shortness Of Breath    Metabolic Disorder Labs: Lab Results  Component Value Date  HGBA1C 5.3 06/21/2016   MPG 105 06/21/2016   No results found for: PROLACTIN Lab Results  Component Value Date   CHOL 145 06/21/2016   TRIG 51 06/21/2016   HDL 44 06/21/2016   CHOLHDL 3.3 06/21/2016   VLDL 10 06/21/2016   LDLCALC 91 06/21/2016   LDLCALC 70 08/22/2013   Lab Results  Component Value Date   TSH 1.545 02/18/2018   TSH 1.683 12/18/2017    Therapeutic Level Labs: No results found for: LITHIUM No results found for: VALPROATE No components found for:  CBMZ  Current Medications: Current Outpatient Medications  Medication Sig Dispense Refill  . aspirin-acetaminophen-caffeine (EXCEDRIN MIGRAINE) 250-250-65 MG tablet Take 1 tablet by mouth every 6 (six) hours as needed.    Marland Kitchen azelastine (ASTELIN) 0.1 % nasal spray Place 1 spray into both nostrils 2 (two) times daily. Use in each nostril as directed    . busPIRone (BUSPAR) 15 MG tablet TAKE ONE TABLET BY MOUTH EVERY EVENING (Patient taking differently: Take 15 mg by mouth 2 (two) times daily. ) 90 tablet 1  . escitalopram (LEXAPRO) 20 MG tablet TAKE ONE TABLET BY MOUTH EVERY DAY 90 tablet 1  . fexofenadine (ALLEGRA) 180 MG tablet Take by mouth.    . furosemide (LASIX) 40 MG tablet Take 1 tablet (40 mg total) by mouth  daily. (Patient taking differently: TAKE ONE TABLET BY MOUTH ONCE DAILY AS NEEDED FOR EDEMA (USE DAILY AND THE SECOND DOSE AS NEEDED)) 30 tablet 0  . lactulose (CHRONULAC) 10 GM/15ML solution Take 10 g by mouth 2 (two) times daily.    Marland Kitchen levothyroxine (SYNTHROID) 50 MCG tablet Take 50 mcg by mouth every morning.    Marland Kitchen LORazepam (ATIVAN) 0.5 MG tablet Take 1 tablet (0.5 mg total) by mouth daily as needed for anxiety. pls limit use 10 tablet 1  . Multiple Vitamin (MULTIVITAMIN WITH MINERALS) TABS tablet Take 1 tablet by mouth daily.    Marland Kitchen omeprazole (PRILOSEC) 40 MG capsule Take 1 capsule (40 mg total) by mouth 2 (two) times daily. 90 capsule 0  . ondansetron (ZOFRAN-ODT) 4 MG disintegrating tablet Take 4 mg by mouth every 8 (eight) hours as needed for nausea or vomiting.    Marland Kitchen QUEtiapine (SEROQUEL) 100 MG tablet Take 1 tablet (100 mg total) by mouth at bedtime. 90 tablet 1  . spironolactone (ALDACTONE) 100 MG tablet Take 1 tablet (100 mg total) by mouth daily. 30 tablet 0  . traZODone (DESYREL) 150 MG tablet TAKE ONE TABLET BY MOUTH AT BEDTIME 90 tablet 2  . XIFAXAN 550 MG TABS tablet Take 1 tablet by mouth 2 (two) times daily.     No current facility-administered medications for this visit.     Musculoskeletal: Strength & Muscle Tone: UTA Gait & Station: UTA Patient leans: N/A  Psychiatric Specialty Exam: Review of Systems  Unable to perform ROS: Dementia    There were no vitals taken for this visit.There is no height or weight on file to calculate BMI.  General Appearance: UTA  Eye Contact:  UTA  Speech:  Clear and Coherent  Volume:  Normal  Mood:  Happy  Affect:  UTA  Thought Process:  Linear and Descriptions of Associations: Intact  Orientation:  Other:  Self  Thought Content: Logical   Suicidal Thoughts:  No  Homicidal Thoughts:  No  Memory:  Immediate;   UTA Recent;   UTA Remote;   UTA  Judgement:  Poor  Insight:  Shallow  Psychomotor Activity:  Decreased  Concentration:   Concentration: Poor and Attention Span: Poor  Recall:  Poor  Fund of Knowledge: Poor  Language: Fair  Akathisia:  No  Handed:  Right  AIMS (if indicated): UTA  Assets:  Housing Social Support  ADL's:  Impaired , ok with assistance  Cognition: Impaired,  Severe  Sleep:  Fair   Screenings: AUDIT     Admission (Discharged) from 06/20/2016 in [place]  Alcohol Use Disorder Identification Test Final Score (AUDIT) 0    ECT-MADRS     ECT Treatment from 07/11/2016 in [place] Admission (Discharged) from 06/20/2016 in [place] Total Score 16 16    Mini-Mental     ECT Treatment from 07/11/2016 in [place] Admission (Discharged) from 06/20/2016 in [place]  Total Score (max 30 points ) 30 28       Assessment and Plan: PURITY IRMEN is a 79 year old Caucasian female who has a history of MDD, GAD, major neurocognitive disorder, chronic liver disease with portal hypertension, hypothyroidism, GERD, was evaluated by phone today.  Patient's neurocognitive symptoms are worsening at this time.  Discussed plan as noted below.  Plan MDD in remission Lexapro 20 mg p.o. daily Seroquel 100 mg p.o. nightly  GAD-stable BuSpar 15 mg p.o. daily. We will restart Ativan 0.5 mg as needed for severe agitation.  Insomnia-stable Trazodone 150 mg p.o. nightly  Major neurocognitive disorder-she will continue to follow-up with her primary provider. Continue Seroquel for mood symptoms.  Collateral information was obtained from Sister-[name].  Encouraged to get in touch with palliative care.  Follow-up in clinic in 2 months or sooner if needed.  I have spent atleast 20 minutes non face to face with patient today. More than 50 % of the time was spent for preparing to see the patient ( e.g., review of test, records ), obtaining and to review and separately  obtained history , ordering medications and test ,psychoeducation and supportive psychotherapy and care coordination,as well as documenting clinical information in electronic health record. This note was generated in part or whole with voice recognition software. Voice recognition is usually quite accurate but there are transcription errors that can and very often do occur. I apologize for any typographical errors that were not detected and corrected.      Ursula Alert, MD 01/27/2020, 11:50 AM

## 2020-03-07 ENCOUNTER — Encounter: Payer: Self-pay | Admitting: Gastroenterology

## 2020-03-07 ENCOUNTER — Ambulatory Visit (INDEPENDENT_AMBULATORY_CARE_PROVIDER_SITE_OTHER): Payer: Medicare Other | Admitting: Gastroenterology

## 2020-03-07 ENCOUNTER — Other Ambulatory Visit: Payer: Self-pay

## 2020-03-07 VITALS — BP 133/82 | HR 94 | Temp 98.5°F | Ht 62.0 in | Wt 147.2 lb

## 2020-03-07 DIAGNOSIS — K7460 Unspecified cirrhosis of liver: Secondary | ICD-10-CM

## 2020-03-07 NOTE — Progress Notes (Signed)
Cephas Darby, MD [address]  [place]  [place], [place] [address]  Main: [phone]  Fax: [phone]    Gastroenterology Consultation  Referring Provider:     Kirk Ruths, MD Primary Care Physician:  Kirk Ruths, MD Primary Gastroenterologist:  Dr. Cephas Darby Reason for Consultation:     Hepatic hydrothorax, decompensated cirrhosis, choking episodes        HPI:   Cassidy [name] is a 79 y.o. female referred by Dr. Ouida Sills, Ocie Cornfield, MD  for consultation & management of recent episode of food impaction.  Patient went to [facility] on 8/26 after she swallowed whole shrimp when she went outside with her sister.  She went to ER right away, did not get endoscopy with removal of food bolus.  There was evidence of irritation in distal esophagus.  No obvious stricture noted.  She has been started on omeprazole 40 mg twice daily.  Apparently, patient does not like to wear dentures even though they fit her well.  According to patient's sister, she does not to.  She likes to swallow everything.  Since last episode, she had another choking episode when she tried to eat pickled beets.  The symptoms lasted for 3 hours and spontaneously passed down at home.  Patient's sister is having hard time managing her diet, with restrictive eating, preparing her food.  She does not have any help at home except from her husband.  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  Her hemoglobin is stable.  She denies any other GI symptoms.  Patient has history of obesity, fatty liver, hypothyroidism with chronic liver disease, splenomegaly, chronic thrombocytopenia and portal hypertension   NSAIDs: None  Antiplts/Anticoagulants/Anti thrombotics: None  GI Procedures: She had several EGDs and colonoscopies in the past.  No known varices or portal hypertensive gastropathy Biopsies negative for H. pylori infection  EGD 12/15/2019 - Food in  the distal esophagus. Removal was successful. - Erythema in the distal esophagus. - Erythematous mucosa in the antrum. - Normal examined duodenum.  EGD 12/20/2014 - Moderate Schatzki ring. Dilated. - Erythematous mucosa in the antrum. Biopsied. - Normal examined duodenum. DIAGNOSIS:  A. STOMACH; COLD BIOPSY:  - ANTRAL MUCOSA WITH FEATURES OF REACTIVE GASTROPATHY, SEE NOTE.  - OXYNTIC MUCOSA WITH FOCAL MINIMAL CHRONIC GASTRITIS.  - NEGATIVE FOR H. PYLORI, DYSPLASIA, AND MALIGNANCY  Past Medical History:  Diagnosis Date   Anxiety    Depression    Fatty liver    GERD (gastroesophageal reflux disease)    Hypothyroidism    Obesity    Thyroid disease     Past Surgical History:  Procedure Laterality Date   APPENDECTOMY     DILATATION & CURETTAGE/HYSTEROSCOPY WITH MYOSURE N/A 02/04/2018   Procedure: [place];  Surgeon: Ward, Honor Loh, MD;  Location: [facility] ORS;  Service: Gynecology;  Laterality: N/A;   ESOPHAGOGASTRODUODENOSCOPY N/A 12/15/2019   Procedure: ESOPHAGOGASTRODUODENOSCOPY (EGD);  Surgeon: Virgel Manifold, MD;  Location: [facility] ENDOSCOPY;  Service: Endoscopy;  Laterality: N/A;   ESOPHAGOGASTRODUODENOSCOPY (EGD) WITH PROPOFOL N/A 12/20/2014   Procedure: ESOPHAGOGASTRODUODENOSCOPY (EGD) WITH PROPOFOL;  Surgeon: Manya Silvas, MD;  Location: [facility] ENDOSCOPY;  Service: Endoscopy;  Laterality: N/A;   SAVORY DILATION N/A 12/20/2014   Procedure: SAVORY DILATION;  Surgeon: Manya Silvas, MD;  Location: [facility] ENDOSCOPY;  Service: Endoscopy;  Laterality: N/A;   TONSILLECTOMY       Current Outpatient Medications:  aspirin-acetaminophen-caffeine (EXCEDRIN MIGRAINE) 250-250-65 MG tablet, Take 1 tablet by mouth every 6 (six) hours as needed., Disp: , Rfl:    azelastine (ASTELIN) 0.1 % nasal spray, Place 1 spray into both nostrils 2 (two) times daily. Use in each nostril as directed, Disp: , Rfl:    busPIRone (BUSPAR) 15 MG  tablet, TAKE ONE TABLET BY MOUTH EVERY EVENING (Patient taking differently: Take 15 mg by mouth 2 (two) times daily. ), Disp: 90 tablet, Rfl: 1   escitalopram (LEXAPRO) 20 MG tablet, TAKE ONE TABLET BY MOUTH EVERY DAY, Disp: 90 tablet, Rfl: 1   fexofenadine (ALLEGRA) 180 MG tablet, Take by mouth., Disp: , Rfl:    furosemide (LASIX) 40 MG tablet, Take 1 tablet (40 mg total) by mouth daily. (Patient taking differently: TAKE ONE TABLET BY MOUTH ONCE DAILY AS NEEDED FOR EDEMA (USE DAILY AND THE SECOND DOSE AS NEEDED)), Disp: 30 tablet, Rfl: 0   lactulose (CHRONULAC) 10 GM/15ML solution, Take 10 g by mouth 2 (two) times daily., Disp: , Rfl:    levothyroxine (SYNTHROID) 50 MCG tablet, Take 50 mcg by mouth every morning., Disp: , Rfl:    LORazepam (ATIVAN) 0.5 MG tablet, Take 1 tablet (0.5 mg total) by mouth daily as needed for anxiety. pls limit use, Disp: 10 tablet, Rfl: 1   Multiple Vitamin (MULTIVITAMIN WITH MINERALS) TABS tablet, Take 1 tablet by mouth daily., Disp: , Rfl:    ondansetron (ZOFRAN-ODT) 4 MG disintegrating tablet, Take 4 mg by mouth every 8 (eight) hours as needed for nausea or vomiting., Disp: , Rfl:    QUEtiapine (SEROQUEL) 100 MG tablet, Take 1 tablet (100 mg total) by mouth at bedtime., Disp: 90 tablet, Rfl: 1   spironolactone (ALDACTONE) 100 MG tablet, Take 1 tablet (100 mg total) by mouth daily., Disp: 30 tablet, Rfl: 0   traZODone (DESYREL) 150 MG tablet, TAKE ONE TABLET BY MOUTH AT BEDTIME, Disp: 90 tablet, Rfl: 2   XIFAXAN 550 MG TABS tablet, Take 1 tablet by mouth 2 (two) times daily., Disp: , Rfl:    omeprazole (PRILOSEC) 40 MG capsule, Take 1 capsule (40 mg total) by mouth 2 (two) times daily., Disp: 90 capsule, Rfl: 0  Family History  Problem Relation Age of Onset   Diabetes Mother    COPD Mother    Kidney disease Mother    Depression Mother    Hypertension Mother    Heart attack Father    Aneurysm Father    Anxiety disorder Sister    Depression  Sister    Diabetes Sister    Hypertension Sister    Atrial fibrillation Brother    Spinal muscular atrophy Brother    Breast cancer Sister    Bone cancer Sister    Depression Sister    Anxiety disorder Sister    Liver cancer Sister    Breast cancer Sister    Colon cancer Sister    Depression Sister    Diabetes Sister    COPD Sister    Depression Sister    Anxiety disorder Sister    Skin cancer Sister    Diabetes Brother    Depression Brother    Skin cancer Maternal Aunt    Diabetes Maternal Aunt    Cervical cancer Paternal Aunt      Social History   Tobacco Use   Smoking status: Never Smoker   Smokeless tobacco: Never Used  Vaping Use   Vaping Use: Never used  Substance Use Topics   Alcohol use: No  Drug use: No    Allergies as of 03/07/2020 - Review Complete 03/07/2020  Allergen Reaction Noted   Sulfa antibiotics Shortness Of Breath 11/27/2014    Review of Systems:    All systems reviewed and negative except where noted in HPI.   Physical Exam:  BP 133/82 (BP Location: Left Arm, Patient Position: Sitting, Cuff Size: Normal)    Pulse 94    Temp 98.5 F (36.9 C) (Oral)    Ht 5' 2"  (1.575 m)    Wt 147 lb 4 oz (66.8 kg)    LMP  (LMP Unknown)    BMI 26.93 kg/m  No LMP recorded (lmp unknown). Patient is postmenopausal.  General:   Alert, moderately built, moderately nourished, pleasant and cooperative in NAD Head:  Normocephalic and atraumatic. Eyes:  Sclera clear, no icterus.   Conjunctiva pink. Ears:  Normal auditory acuity. Nose:  No deformity, discharge, or lesions. Mouth:  No deformity or lesions,oropharynx pink & moist. Neck:  Supple; no masses or thyromegaly. Lungs:  Respirations even and unlabored.  Clear throughout to auscultation.   No wheezes, crackles, or rhonchi. No acute distress. Heart:  Regular rate and rhythm; no murmurs, clicks, rubs, or gallops. Abdomen:  Normal bowel sounds. Soft, non-tender and non-distended  without masses, hepatosplenomegaly or hernias noted.  No guarding or rebound tenderness.   Rectal: Not performed Msk:  Symmetrical without gross deformities. Good, equal movement & strength bilaterally. Pulses:  Normal pulses noted. Extremities:  No clubbing or edema.  No cyanosis. Neurologic:  Alert and oriented x2;  grossly normal neurologically. Skin:  Intact without significant lesions or rashes. No jaundice.  Spontaneous bruises on her bilateral upper extremities Psych:  Alert and cooperative. Normal mood and affect.  Imaging Studies: Reviewed  Assessment and Plan:   Cassidy [name] is a 79 y.o. Caucasian female with obesity, fatty liver, hypothyroidism with chronic liver disease, splenomegaly, chronic thrombocytopenia and portal hypertension with recurrent right-sided pleural effusions, status post therapeutic thoracentesis in 01/2018.  Fluid analysis consistent with hepatic hydrothorax, serum-to-pleural fluid albumin gradient >1.1 Also, recent food impaction s/p EGD with food bolus removal in 11/2019  Food impaction: Most likely secondary to lack of dentures Patient is not willing to make dentures Reiterated on modified diet, strongly advised on pured food, mechanical soft diet, discussed about different options EGD revealed mild esophagitis, recommend to continue omeprazole 40 mg daily Patient had endoscopy at this time, patient's weight has been stable and no episodes of dysphagia  Chronic liver disease with portal hypertension: There is no evidence of nodularity of the liver based on imaging 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  EGD on 11/2019 did not reveal any evidence of varices PSE: Continue rifaximin, MiraLAX 1 to 2 cups daily to have at least 2 soft bowel movements per day [place] screening: No evidence of liver lesions based on the CT scan in 01/2018 Recommend right upper quadrant ultrasound for  [place] screening    Follow up every 6 months  Cephas Darby, MD

## 2020-03-07 NOTE — Patient Instructions (Addendum)
Your RUQ ultrasound is scheduled for 03/13/2020 at 11:00am. Please arrived at the medical mall at 10:30am. Nothing to eat or drink after midnight

## 2020-03-13 ENCOUNTER — Ambulatory Visit
Admission: RE | Admit: 2020-03-13 | Discharge: 2020-03-13 | Disposition: A | Discharge status: Home / Self Care | Payer: Medicare Other | Source: Ambulatory Visit | Attending: Gastroenterology | Admitting: Gastroenterology

## 2020-03-13 ENCOUNTER — Other Ambulatory Visit: Payer: Self-pay

## 2020-03-13 DIAGNOSIS — K7460 Unspecified cirrhosis of liver: Secondary | ICD-10-CM | POA: Diagnosis not present

## 2020-03-19 ENCOUNTER — Telehealth: Payer: Self-pay

## 2020-03-19 NOTE — Telephone Encounter (Signed)
Tried to call patient but mailbox was full. Sent mychart message

## 2020-03-19 NOTE — Telephone Encounter (Signed)
-----   Message from Lin Landsman, MD sent at 03/18/2020  7:42 PM EST ----- No liver lesions based on [place], except known cirrhosis of liver  RV

## 2020-03-30 ENCOUNTER — Other Ambulatory Visit: Payer: Self-pay

## 2020-03-30 ENCOUNTER — Encounter: Payer: Self-pay | Admitting: Psychiatry

## 2020-03-30 ENCOUNTER — Telehealth (INDEPENDENT_AMBULATORY_CARE_PROVIDER_SITE_OTHER): Payer: Medicare Other | Admitting: Psychiatry

## 2020-03-30 DIAGNOSIS — F411 Generalized anxiety disorder: Secondary | ICD-10-CM

## 2020-03-30 DIAGNOSIS — F5105 Insomnia due to other mental disorder: Secondary | ICD-10-CM | POA: Diagnosis not present

## 2020-03-30 DIAGNOSIS — F0280 Dementia in other diseases classified elsewhere without behavioral disturbance: Secondary | ICD-10-CM | POA: Diagnosis not present

## 2020-03-30 DIAGNOSIS — F3342 Major depressive disorder, recurrent, in full remission: Secondary | ICD-10-CM

## 2020-03-30 NOTE — Progress Notes (Signed)
Virtual Visit via Video Note  I connected with Cassidy [name] on 03/30/20 at 10:30 AM EST by a video enabled telemedicine application and verified that I am speaking with the correct person using two identifiers.  Location Provider Location : [facility] Patient Location : Home  Participants: Patient , Cassidy [name], Provider   I discussed the limitations of evaluation and management by telemedicine and the availability of in person appointments. The patient expressed understanding and agreed to proceed.  I discussed the assessment and treatment plan with the patient. The patient was provided an opportunity to ask questions and all were answered. The patient agreed with the plan and demonstrated an understanding of the instructions.   The patient was advised to call back or seek an in-person evaluation if the symptoms worsen or if the condition fails to improve as anticipated.   [place] MD OP Progress Note  03/30/2020 10:49 AM EMERSON BARRETTO  MRN:  [id number]  Chief Complaint:  Chief Complaint    Follow-up     HPI: Cassidy [name] is a 78 year old Caucasian female who has a history of MDD, GAD, insomnia, panic attacks, thrombocytopenia, hypothyroidism, chronic liver disease with portal hypertension, gastroesophageal reflux disease, dementia was evaluated by telemedicine today.  Patient today reports she is currently doing well.  She was able to give appropriate answers to questions asked.  She appeared to be alert and oriented.  She was able to give the date correctly.  Her immediate memory seems to be fine.  Patient denies any suicidality.  She denies any perceptual disturbances.  She reports she is compliant on medications.  Denies side effects.  Collateral information was obtained from grandson-Cassidy [name].  He reports patient's sister currently is recovering from pneumonia and hence he had to take a few days off from work to stay with his grandmother.  As far as he has observed she is currently  doing well.  He has not observed any worsening of her dementia.  She has not had any episodes of agitation recently.  Her appetite seems to be fair.  She is sleeping okay.  She currently has a routine and that seems to be helpful.  Patient as well as grandson denies any other concerns.    Visit Diagnosis:    ICD-10-CM   1. MDD (major depressive disorder), recurrent, in full remission ([place])  F33.42   2. GAD (generalized anxiety disorder)  F41.1   3. Insomnia due to mental disorder  F51.05   4. 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 Anxiety   . Depression   . Fatty liver   . GERD (gastroesophageal reflux disease)   . Hypothyroidism   . Obesity   . Thyroid disease     Past Surgical History:  Procedure Laterality Date  . APPENDECTOMY    . DILATATION & CURETTAGE/HYSTEROSCOPY WITH MYOSURE N/A 02/04/2018   Procedure: DILATATION & CURETTAGE/HYSTEROSCOPY WITH MYOSURE;  Surgeon: Ward, Honor Loh, MD;  Location: [facility] ORS;  Service: Gynecology;  Laterality: N/A;  . ESOPHAGOGASTRODUODENOSCOPY N/A 12/15/2019   Procedure: ESOPHAGOGASTRODUODENOSCOPY (EGD);  Surgeon: Virgel Manifold, MD;  Location: [facility] ENDOSCOPY;  Service: Endoscopy;  Laterality: N/A;  . ESOPHAGOGASTRODUODENOSCOPY (EGD) WITH PROPOFOL N/A 12/20/2014   Procedure: ESOPHAGOGASTRODUODENOSCOPY (EGD) WITH PROPOFOL;  Surgeon: Manya Silvas, MD;  Location: [facility]  ENDOSCOPY;  Service: Endoscopy;  Laterality: N/A;  . SAVORY DILATION N/A 12/20/2014   Procedure: SAVORY DILATION;  Surgeon: Manya Silvas, MD;  Location: [facility] ENDOSCOPY;  Service: Endoscopy;  Laterality: N/A;  . TONSILLECTOMY      Family Psychiatric History: Reviewed family psychiatric history from my progress note on 06/23/2017  Family History:  Family History   Problem Relation Age of Onset  . Diabetes Mother   . COPD Mother   . Kidney disease Mother   . Depression Mother   . Hypertension Mother   . Heart attack Father   . Aneurysm Father   . Anxiety disorder Sister   . Depression Sister   . Diabetes Sister   . Hypertension Sister   . Atrial fibrillation Brother   . Spinal muscular atrophy Brother   . Breast cancer Sister   . Bone cancer Sister   . Depression Sister   . Anxiety disorder Sister   . Liver cancer Sister   . Breast cancer Sister   . Colon cancer Sister   . Depression Sister   . Diabetes Sister   . COPD Sister   . Depression Sister   . Anxiety disorder Sister   . Skin cancer Sister   . Diabetes Brother   . Depression Brother   . Skin cancer Maternal Aunt   . Diabetes Maternal Aunt   . Cervical cancer Paternal Aunt     Social History: Reviewed social history from my progress note on 06/23/2017 Social History   Socioeconomic History  . Marital status: Widowed    Spouse name: Not on file  . Number of children: 3  . Years of education: Not on file  . Highest education level: Not on file  Occupational History  . Not on file  Tobacco Use  . Smoking status: Never Smoker  . Smokeless tobacco: Never Used  Vaping Use  . Vaping Use: Never used  Substance and Sexual Activity  . Alcohol use: No  . Drug use: No  . Sexual activity: Not Currently    Birth control/protection: Post-menopausal  Other Topics Concern  . Not on file  Social History Narrative  . Not on file   Social Determinants of Health   Financial Resource Strain: Not on file  Food Insecurity: Not on file  Transportation Needs: Not on file  Physical Activity: Not on file  Stress: Not on file  Social Connections: Not on file    Allergies:  Allergies  Allergen Reactions  . Sulfa Antibiotics Shortness Of Breath    Metabolic Disorder Labs: Lab Results  Component Value Date   HGBA1C 5.3 06/21/2016   MPG 105 06/21/2016   No results found  for: PROLACTIN Lab Results  Component Value Date   CHOL 145 06/21/2016   TRIG 51 06/21/2016   HDL 44 06/21/2016   CHOLHDL 3.3 06/21/2016   VLDL 10 06/21/2016   LDLCALC 91 06/21/2016   LDLCALC 70 08/22/2013   Lab Results  Component Value Date   TSH 1.545 02/18/2018   TSH 1.683 12/18/2017    Therapeutic Level Labs: No results found for: LITHIUM No results found for: VALPROATE No components found for:  CBMZ  Current Medications: Current Outpatient Medications  Medication Sig Dispense Refill  . aspirin-acetaminophen-caffeine (EXCEDRIN MIGRAINE) 250-250-65 MG tablet Take 1 tablet by mouth every 6 (six) hours as needed.    Marland Kitchen azelastine (ASTELIN) 0.1 % nasal spray Place 1 spray into both nostrils 2 (two) times daily. Use in each nostril as directed    .  busPIRone (BUSPAR) 15 MG tablet TAKE ONE TABLET BY MOUTH EVERY EVENING (Patient taking differently: Take 15 mg by mouth 2 (two) times daily. ) 90 tablet 1  . escitalopram (LEXAPRO) 20 MG tablet TAKE ONE TABLET BY MOUTH EVERY DAY 90 tablet 1  . fexofenadine (ALLEGRA) 180 MG tablet Take by mouth.    . furosemide (LASIX) 40 MG tablet Take 1 tablet (40 mg total) by mouth daily. (Patient taking differently: TAKE ONE TABLET BY MOUTH ONCE DAILY AS NEEDED FOR EDEMA (USE DAILY AND THE SECOND DOSE AS NEEDED)) 30 tablet 0  . lactulose (CHRONULAC) 10 GM/15ML solution Take 10 g by mouth 2 (two) times daily.    Marland Kitchen levothyroxine (SYNTHROID) 50 MCG tablet Take 50 mcg by mouth every morning.    Marland Kitchen LORazepam (ATIVAN) 0.5 MG tablet Take 1 tablet (0.5 mg total) by mouth daily as needed for anxiety. pls limit use 10 tablet 1  . Multiple Vitamin (MULTIVITAMIN WITH MINERALS) TABS tablet Take 1 tablet by mouth daily.    Marland Kitchen omeprazole (PRILOSEC) 40 MG capsule Take 1 capsule (40 mg total) by mouth 2 (two) times daily. 90 capsule 0  . ondansetron (ZOFRAN-ODT) 4 MG disintegrating tablet Take 4 mg by mouth every 8 (eight) hours as needed for nausea or vomiting.    Marland Kitchen  QUEtiapine (SEROQUEL) 100 MG tablet Take 1 tablet (100 mg total) by mouth at bedtime. 90 tablet 1  . spironolactone (ALDACTONE) 100 MG tablet Take 1 tablet (100 mg total) by mouth daily. 30 tablet 0  . traZODone (DESYREL) 150 MG tablet TAKE ONE TABLET BY MOUTH AT BEDTIME 90 tablet 2  . XIFAXAN 550 MG TABS tablet Take 1 tablet by mouth 2 (two) times daily.     No current facility-administered medications for this visit.     Musculoskeletal: Strength & Muscle Tone: UTA Gait & Station: UTA Patient leans: N/A  Psychiatric Specialty Exam: Review of Systems  Unable to perform ROS: Dementia    There were no vitals taken for this visit.There is no height or weight on file to calculate BMI.  General Appearance: Casual  Eye Contact:  UTA  Speech:  Normal Rate  Volume:  Normal  Mood:  Euthymic  Affect:  Congruent  Thought Process:  Goal Directed and Descriptions of Associations: Intact  Orientation:  Other:  Self  Thought Content: Logical   Suicidal Thoughts:  No  Homicidal Thoughts:  No  Memory:  Immediate;   Fair Recent;   Poor Remote;   Poor  Judgement:  Impaired  Insight:  Shallow  Psychomotor Activity:  Decreased  Concentration:  Concentration: Fair and Attention Span: Poor  Recall:  Poor  Fund of Knowledge: Poor  Language: Fair  Akathisia:  No  Handed:  Right  AIMS (if indicated): UTA  Assets:  Housing Social Support  ADL's:  Intact with assistance  Cognition: Impaired,  Severe  Sleep:  Fair   Screenings: AUDIT   Flowsheet Row Admission (Discharged) from 06/20/2016 in [place]  Alcohol Use Disorder Identification Test Final Score (AUDIT) 0    ECT-MADRS   Flowsheet Row ECT Treatment from 07/11/2016 in [place] Admission (Discharged) from 06/20/2016 in [place] Total Score 16 16    Mini-Mental   Flowsheet Row ECT Treatment from 07/11/2016 in [name] Admission (Discharged) from 06/20/2016 in [place]  Total Score (max 30 points ) 30 28  Assessment and Plan: Cassidy [name] is a 79 year old Caucasian female who has a history of MDD, GAD, major neurocognitive disorder, chronic liver disease with portal hypertension, hypothyroidism, GERD was evaluated by telemedicine today.  Patient currently is doing okay with regards to her mood.  Plan as noted below.  Plan MDD in remission Lexapro 20 mg p.o. daily Seroquel 100 mg p.o. nightly  GAD-stable BuSpar 15 mg p.o. daily Ativan 0.5 mg as needed for severe agitation only.  She has not needed any according to her grandson.  Insomnia-stable Trazodone 150 mg p.o. nightly  Major neurocognitive disorder-she will continue to follow-up with her primary care provider. Continue Seroquel for mood symptoms.  Collateral information was obtained from grandson [place].  Follow-up in clinic in 2 months or sooner if needed.  I have spent atleast 20 minutes face to face by video with patient today. More than 50 % of the time was spent for preparing to see the patient ( e.g., review of test, records ),ordering medications and test ,psychoeducation and supportive psychotherapy and care coordination,as well as documenting clinical information in electronic health record. This note was generated in part or whole with voice recognition software. Voice recognition is usually quite accurate but there are transcription errors that can and very often do occur. I apologize for any typographical errors that were not detected and corrected.      Ursula Alert, MD 03/30/2020, 10:49 AM

## 2020-05-03 ENCOUNTER — Other Ambulatory Visit: Payer: Self-pay | Admitting: Psychiatry

## 2020-05-05 ENCOUNTER — Other Ambulatory Visit: Payer: Self-pay | Admitting: Psychiatry

## 2020-05-07 NOTE — Telephone Encounter (Signed)
pt POA called left message that her sister can not take the ativan it make her hallucinate and she needs klonopin

## 2020-05-08 ENCOUNTER — Telehealth: Payer: Self-pay | Admitting: Psychiatry

## 2020-05-08 DIAGNOSIS — F411 Generalized anxiety disorder: Secondary | ICD-10-CM

## 2020-05-08 DIAGNOSIS — F3342 Major depressive disorder, recurrent, in full remission: Secondary | ICD-10-CM

## 2020-05-08 MED ORDER — CLONAZEPAM 0.5 MG PO TABS: 0.5000 mg | ORAL_TABLET | Freq: Every day | ORAL | 1 refills | Status: DC | PRN

## 2020-05-08 NOTE — Telephone Encounter (Signed)
Will stop Ativan. Will send Klonopin to pharmacy.

## 2020-05-10 NOTE — Telephone Encounter (Signed)
Notifed pt sister that takes care of her that to stop ativan and klonopin had been sent to pharmacy.

## 2020-05-18 ENCOUNTER — Other Ambulatory Visit: Payer: Self-pay | Admitting: Psychiatry

## 2020-05-18 DIAGNOSIS — F3342 Major depressive disorder, recurrent, in full remission: Secondary | ICD-10-CM

## 2020-05-18 DIAGNOSIS — F330 Major depressive disorder, recurrent, mild: Secondary | ICD-10-CM

## 2020-06-14 ENCOUNTER — Encounter: Payer: Self-pay | Admitting: Psychiatry

## 2020-06-14 ENCOUNTER — Telehealth (INDEPENDENT_AMBULATORY_CARE_PROVIDER_SITE_OTHER): Payer: Medicare Other | Admitting: Psychiatry

## 2020-06-14 ENCOUNTER — Other Ambulatory Visit: Payer: Self-pay

## 2020-06-14 DIAGNOSIS — F411 Generalized anxiety disorder: Secondary | ICD-10-CM

## 2020-06-14 DIAGNOSIS — F0280 Dementia in other diseases classified elsewhere without behavioral disturbance: Secondary | ICD-10-CM | POA: Diagnosis not present

## 2020-06-14 DIAGNOSIS — F5105 Insomnia due to other mental disorder: Secondary | ICD-10-CM

## 2020-06-14 DIAGNOSIS — F3342 Major depressive disorder, recurrent, in full remission: Secondary | ICD-10-CM

## 2020-06-14 MED ORDER — BUSPIRONE HCL 10 MG PO TABS: 10.0000 mg | ORAL_TABLET | Freq: Three times a day (TID) | ORAL | 1 refills | Status: DC

## 2020-06-14 NOTE — Progress Notes (Signed)
Virtual Visit via Telephone Note  I connected with Cassidy [name] on 06/14/20 at 10:30 AM EST by telephone and verified that I am speaking with the correct person using two identifiers.  Location Provider Location : [facility] Patient Location : Home  Participants: Patient ,Sister, Provider    I discussed the limitations, risks, security and privacy concerns of performing an evaluation and management service by telephone and the availability of in person appointments. I also discussed with the patient that there may be a patient responsible charge related to this service. The patient expressed understanding and agreed to proceed.   I discussed the assessment and treatment plan with the patient. The patient was provided an opportunity to ask questions and all were answered. The patient agreed with the plan and demonstrated an understanding of the instructions.   The patient was advised to call back or seek an in-person evaluation if the symptoms worsen or if the condition fails to improve as anticipated.   [place] MD OP Progress Note  06/14/2020 12:43 PM RITI ROLLYSON  MRN:  [id number]  Chief Complaint:  Chief Complaint    Follow-up     HPI: Cassidy [name] is a 80 year old Caucasian female who has a history of MDD, GAD, insomnia, panic attacks, thrombocytopenia, hypothyroidism, chronic liver disease with portal hypertension, gastroesophageal reflux disease, dementia was evaluated by telemedicine today.  Patient being a limited historian majority of information was obtained from sister-[name].  According to sister patient is currently having more depressive symptoms.  She reports her grandson recently left his apartment and decided that he is no longer going to take care of her anymore.  Since then she has been very depressed.   She continues to have a good appetite since her sister cooks for her.  She has been sleeping okay.  Patient did not express any hallucinations or other perceptual  disturbances.  However since this was a phone call the evaluation was limited.  Patient appeared to be alert to self, person as well as was able to tell the month as February and year as 2022.  Patient denies any other concerns today.  Visit Diagnosis:    ICD-10-CM   1. MDD (major depressive disorder), recurrent, in full remission ([place])  F33.42 busPIRone (BUSPAR) 10 MG tablet  2. GAD (generalized anxiety disorder)  F41.1 busPIRone (BUSPAR) 10 MG tablet  3. Insomnia due to mental disorder  F51.05   4. 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 Anxiety   . Depression   . Fatty liver   . GERD (gastroesophageal reflux disease)   . Hypothyroidism   . Obesity   . Thyroid disease     Past Surgical History:  Procedure Laterality Date  . APPENDECTOMY    . DILATATION & CURETTAGE/HYSTEROSCOPY WITH MYOSURE N/A 02/04/2018   Procedure: DILATATION & CURETTAGE/HYSTEROSCOPY WITH MYOSURE;  Surgeon: Ward, Honor Loh, MD;  Location: [facility] ORS;  Service: Gynecology;  Laterality: N/A;  . ESOPHAGOGASTRODUODENOSCOPY N/A 12/15/2019   Procedure: ESOPHAGOGASTRODUODENOSCOPY (EGD);  Surgeon: Virgel Manifold, MD;  Location[facility] ENDOSCOPY;  Service: Endoscopy;  Laterality: N/A;  . ESOPHAGOGASTRODUODENOSCOPY (EGD) WITH PROPOFOL N/A 12/20/2014   Procedure: ESOPHAGOGASTRODUODENOSCOPY (EGD) WITH PROPOFOL;  Surgeon: Manya Silvas, MD;  Location: [facility] ENDOSCOPY;  Service: Endoscopy;  Laterality: N/A;  . SAVORY DILATION N/A  12/20/2014   Procedure: SAVORY DILATION;  Surgeon: Manya Silvas, MD;  Location: [facility] ENDOSCOPY;  Service: Endoscopy;  Laterality: N/A;  . TONSILLECTOMY      Family Psychiatric History: I have reviewed family psychiatric history from my progress note on 06/23/2017  Family History:  Family  History  Problem Relation Age of Onset  . Diabetes Mother   . COPD Mother   . Kidney disease Mother   . Depression Mother   . Hypertension Mother   . Heart attack Father   . Aneurysm Father   . Anxiety disorder Sister   . Depression Sister   . Diabetes Sister   . Hypertension Sister   . Atrial fibrillation Brother   . Spinal muscular atrophy Brother   . Breast cancer Sister   . Bone cancer Sister   . Depression Sister   . Anxiety disorder Sister   . Liver cancer Sister   . Breast cancer Sister   . Colon cancer Sister   . Depression Sister   . Diabetes Sister   . COPD Sister   . Depression Sister   . Anxiety disorder Sister   . Skin cancer Sister   . Diabetes Brother   . Depression Brother   . Skin cancer Maternal Aunt   . Diabetes Maternal Aunt   . Cervical cancer Paternal Aunt     Social History: I have reviewed social history from my progress note on 06/23/2017 Social History   Socioeconomic History  . Marital status: Widowed    Spouse name: Not on file  . Number of children: 3  . Years of education: Not on file  . Highest education level: Not on file  Occupational History  . Not on file  Tobacco Use  . Smoking status: Never Smoker  . Smokeless tobacco: Never Used  Vaping Use  . Vaping Use: Never used  Substance and Sexual Activity  . Alcohol use: No  . Drug use: No  . Sexual activity: Not Currently    Birth control/protection: Post-menopausal  Other Topics Concern  . Not on file  Social History Narrative  . Not on file   Social Determinants of Health   Financial Resource Strain: Not on file  Food Insecurity: Not on file  Transportation Needs: Not on file  Physical Activity: Not on file  Stress: Not on file  Social Connections: Not on file    Allergies:  Allergies  Allergen Reactions  . Sulfa Antibiotics Shortness Of Breath    Metabolic Disorder Labs: Lab Results  Component Value Date   HGBA1C 5.3 06/21/2016   MPG 105 06/21/2016   No  results found for: PROLACTIN Lab Results  Component Value Date   CHOL 145 06/21/2016   TRIG 51 06/21/2016   HDL 44 06/21/2016   CHOLHDL 3.3 06/21/2016   VLDL 10 06/21/2016   LDLCALC 91 06/21/2016   LDLCALC 70 08/22/2013   Lab Results  Component Value Date   TSH 1.545 02/18/2018   TSH 1.683 12/18/2017    Therapeutic Level Labs: No results found for: LITHIUM No results found for: VALPROATE No components found for:  CBMZ  Current Medications: Current Outpatient Medications  Medication Sig Dispense Refill  . busPIRone (BUSPAR) 10 MG tablet Take 1 tablet (10 mg total) by mouth 3 (three) times daily. 90 tablet 1  . aspirin-acetaminophen-caffeine (EXCEDRIN MIGRAINE) 250-250-65 MG tablet Take 1 tablet by mouth every 6 (six) hours as needed.    Marland Kitchen azelastine (ASTELIN) 0.1 % nasal spray Place 1  spray into both nostrils 2 (two) times daily. Use in each nostril as directed    . clonazePAM (KLONOPIN) 0.5 MG tablet Take 1 tablet (0.5 mg total) by mouth daily as needed for anxiety. 15 tablet 1  . escitalopram (LEXAPRO) 20 MG tablet TAKE ONE TABLET BY MOUTH EVERY DAY 90 tablet 1  . fexofenadine (ALLEGRA) 180 MG tablet Take by mouth.    . fluticasone (FLONASE) 50 MCG/ACT nasal spray Place 2 sprays into both nostrils daily.    . furosemide (LASIX) 40 MG tablet Take 1 tablet (40 mg total) by mouth daily. (Patient taking differently: TAKE ONE TABLET BY MOUTH ONCE DAILY AS NEEDED FOR EDEMA (USE DAILY AND THE SECOND DOSE AS NEEDED)) 30 tablet 0  . lactulose (CHRONULAC) 10 GM/15ML solution Take 10 g by mouth 2 (two) times daily.    Marland Kitchen levothyroxine (SYNTHROID) 50 MCG tablet Take 50 mcg by mouth every morning.    . Multiple Vitamin (MULTIVITAMIN WITH MINERALS) TABS tablet Take 1 tablet by mouth daily.    Marland Kitchen omeprazole (PRILOSEC) 40 MG capsule Take 1 capsule (40 mg total) by mouth 2 (two) times daily. 90 capsule 0  . ondansetron (ZOFRAN-ODT) 4 MG disintegrating tablet Take 4 mg by mouth every 8 (eight)  hours as needed for nausea or vomiting.    Marland Kitchen QUEtiapine (SEROQUEL) 100 MG tablet TAKE 1 TABLET BY MOUTH NIGHTLY 90 tablet 1  . spironolactone (ALDACTONE) 100 MG tablet Take 1 tablet (100 mg total) by mouth daily. 30 tablet 0  . traZODone (DESYREL) 150 MG tablet TAKE ONE TABLET BY MOUTH AT BEDTIME 90 tablet 2  . XIFAXAN 550 MG TABS tablet Take 1 tablet by mouth 2 (two) times daily.     No current facility-administered medications for this visit.     Musculoskeletal: Strength & Muscle Tone: UTA Gait & Station: UTA Patient leans: N/A  Psychiatric Specialty Exam: Review of Systems  Unable to perform ROS: Dementia    There were no vitals taken for this visit.There is no height or weight on file to calculate BMI.  General Appearance: UTA  Eye Contact:  UTA  Speech:  Clear and Coherent  Volume:  Normal  Mood:  Depressed  Affect:  UTA  Thought Process:  Goal Directed and Descriptions of Associations: Intact  Orientation:  Other:  Self, situation  Thought Content: Rumination   Suicidal Thoughts:  No  Homicidal Thoughts:  No  Memory:  Immediate;   Limited Recent;   Limited Remote;   Limited  Judgement:  Impaired  Insight:  Shallow  Psychomotor Activity:  UTA  Concentration:  Concentration: Fair and Attention Span: Fair  Recall:  Poor  Fund of Knowledge: Poor  Language: Fair  Akathisia:  No  Handed:  Right  AIMS (if indicated): UTA  Assets:  Housing Social Support  ADL's:  Intact  Cognition: Impaired,  Moderate  Sleep:  Fair   Screenings: AUDIT   Flowsheet Row Admission (Discharged) from 06/20/2016 in [place]  Alcohol Use Disorder Identification Test Final Score (AUDIT) 0    ECT-MADRS   Flowsheet Row ECT Treatment from 07/11/2016 in [place] Admission (Discharged) from 06/20/2016 in [place] Total Score 16 16    Mini-Mental   Flowsheet Row ECT Treatment from 07/11/2016 in  [place] Admission (Discharged) from 06/20/2016 in [place]  Total Score (max 30 points ) 30 28  Assessment and Plan: Cassidy [name] is a 80 year old Caucasian female who has a history of MDD, GAD, major neurocognitive disorder, chronic liver disease with portal hypertension, hypothyroidism, GERD was evaluated by telemedicine today.  Patient is currently struggling with depressive symptoms, cognitive issues.  She will benefit from the following plan.  Plan MDD-unstable Continue Lexapro 20 mg p.o. daily Increase BuSpar to 10 mg p.o. 3 times daily Seroquel 100 mg p.o. nightly  GAD-unstable Increase BuSpar to 10 mg p.o. 3 times daily Continue Klonopin as prescribed as needed for severe agitation only.  Patient advised to limit use   Insomnia-stable Trazodone 150 mg p.o. nightly  Major neurocognitive disorder-patient to continue to follow-up with her primary care provider. Seroquel as needed for mood lability.  Collateral information was obtained from sister-[name]  Follow-up in clinic in person 3 to 4 weeks from now.  I have spent atleast 20 minutes non face to face with patient today. More than 50 % of the time was spent for preparing to see the patient ( e.g., review of test, records ), obtaining and to review and separately obtained history , ordering medications and test ,psychoeducation and supportive psychotherapy and care coordination,as well as documenting clinical information in electronic health record. This note was generated in part or whole with voice recognition software. Voice recognition is usually quite accurate but there are transcription errors that can and very often do occur. I apologize for any typographical errors that were not detected and corrected.      Ursula Alert, MD 06/14/2020, 9:02 PM

## 2020-06-28 ENCOUNTER — Telehealth: Payer: Self-pay

## 2020-06-28 NOTE — Telephone Encounter (Signed)
I spoke with her sister today. She reports that she Evelina has been feeling more depressed after her grandson left. Had more emotional night last night and she expressed the thoughts of dying. She reports that Charleigh is safe at home and she is watching her and sleeps with her at night. She reports that she would bring her to ER if there are acute safety risk but tells me that she is safe at home. She reports that Celise needs more help then she can provide like nursing home etc. We discussed that writer will convey this message to Dr. Shea Evans for her to call tomorrow.

## 2020-06-28 NOTE — Telephone Encounter (Signed)
Please call sister and recommend sister to bring pt to ER due to safety concerns.

## 2020-06-28 NOTE — Telephone Encounter (Signed)
pt sister calling she states she needs some help her sister talking about dying stattes she wants to die, at night her depression gets worse. she doesnt want to go to er and seat for hours

## 2020-06-28 NOTE — Telephone Encounter (Signed)
She stated that she called pcp and waiting on them to call her back states that she needs to go into a nursing home or something. She knows dr. Shea Evans not in the office today and that Dr.Umrania advised going to ER but she stated she not going to hospital because of covid and having to wait so long

## 2020-06-29 NOTE — Telephone Encounter (Signed)
Contacted [name]-sister discussed patient's current status.  According to [name] that she had a better night last night.  She does not believe she is a danger to herself or others at this time.  She however is trying to find her more support since [name] is also struggling with health issues.  Provided information for pace program [phone]  Also provided information for behavioral health urgent care in [place].

## 2020-07-06 ENCOUNTER — Other Ambulatory Visit: Payer: Self-pay | Admitting: Psychiatry

## 2020-07-06 DIAGNOSIS — F411 Generalized anxiety disorder: Secondary | ICD-10-CM

## 2020-07-17 ENCOUNTER — Other Ambulatory Visit: Payer: Self-pay

## 2020-07-17 ENCOUNTER — Ambulatory Visit (INDEPENDENT_AMBULATORY_CARE_PROVIDER_SITE_OTHER): Payer: Medicare Other | Admitting: Psychiatry

## 2020-07-17 ENCOUNTER — Encounter: Payer: Self-pay | Admitting: Psychiatry

## 2020-07-17 VITALS — BP 118/76 | HR 81 | Temp 97.9°F

## 2020-07-17 DIAGNOSIS — F5105 Insomnia due to other mental disorder: Secondary | ICD-10-CM

## 2020-07-17 DIAGNOSIS — F411 Generalized anxiety disorder: Secondary | ICD-10-CM

## 2020-07-17 DIAGNOSIS — F0280 Dementia in other diseases classified elsewhere without behavioral disturbance: Secondary | ICD-10-CM

## 2020-07-17 DIAGNOSIS — F330 Major depressive disorder, recurrent, mild: Secondary | ICD-10-CM | POA: Diagnosis not present

## 2020-07-17 MED ORDER — BUSPIRONE HCL 10 MG PO TABS: 15.0000 mg | ORAL_TABLET | Freq: Three times a day (TID) | ORAL | 0 refills | Status: DC

## 2020-07-17 NOTE — Progress Notes (Signed)
[facility] MD OP Progress Note  07/17/2020 3:05 PM RIYA HUXFORD  MRN:  [id number]  Chief Complaint:  Chief Complaint    Follow-up; Anxiety; Depression     HPI: Cassidy [name] is a 80 year old Caucasian female who has a history of MDD, GAD, insomnia, panic attacks, thrombocytopenia, hypothyroidism, chronic liver disease with portal hypertension, gastroesophageal reflux disease, dementia was evaluated in person today.  Patient was accompanied by her sister, primary caregiver-[name] who provided collateral information.  Patient was able to answer questions in short phrases.  She reported her mood as depressed.  She however could not elaborate more than that.  She appeared to be alert, oriented to person place, was able to give the date-month correctly, year correctly.  According to sister patient needs a lot of help at home.  She is able to dress herself.  She needs assistance with taking a bath.  She does sit and watch TV however recently she has been watching this movie that has a lot of violence repeatedly.  She had to redirect herself to change that habit.  Patient does have a good appetite.  She does have some trouble swallowing and hence she needs her food to be cut into very small pieces and that helps.  Sister reports however since she herself is struggling with a lot of health problems she feels overwhelmed often about taking care of patient and is currently looking for more help.  She was provided pace program information in the past however reports today that she could not get in touch with them since she was going through the death of a family member and was busy.  She however agrees to give them a call.  Patient today denies any suicidality, homicidality or perceptual disturbances.  Patient denies side effects to medications.  Patient denies any other concerns today.  Visit Diagnosis:    ICD-10-CM   1. MDD (major depressive disorder), recurrent episode, mild (HCC)  F33.0 busPIRone (BUSPAR)  10 MG tablet  2. GAD (generalized anxiety disorder)  F41.1 busPIRone (BUSPAR) 10 MG tablet  3. Insomnia due to mental disorder  F51.05   4. 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 Anxiety   . Depression   . Fatty liver   . GERD (gastroesophageal reflux disease)   . Hypothyroidism   . Obesity   . Thyroid disease     Past Surgical History:  Procedure Laterality Date  . APPENDECTOMY    . DILATATION & CURETTAGE/HYSTEROSCOPY WITH MYOSURE N/A 02/04/2018   Procedure: DILATATION & CURETTAGE/HYSTEROSCOPY WITH MYOSURE;  Surgeon: Ward, Honor Loh, MD;  Location: [facility] ORS;  Service: Gynecology;  Laterality: N/A;  . ESOPHAGOGASTRODUODENOSCOPY N/A 12/15/2019   Procedure: ESOPHAGOGASTRODUODENOSCOPY (EGD);  Surgeon: Virgel Manifold, MD;  Location[facility] ENDOSCOPY;  Service: Endoscopy;  Laterality: N/A;  . ESOPHAGOGASTRODUODENOSCOPY (EGD) WITH PROPOFOL N/A 12/20/2014   Procedure: ESOPHAGOGASTRODUODENOSCOPY (EGD) WITH PROPOFOL;  Surgeon: Manya Silvas, MD;  Location: [facility] ENDOSCOPY;  Service: Endoscopy;  Laterality: N/A;  . SAVORY DILATION N/A 12/20/2014   Procedure: SAVORY DILATION;  Surgeon: Manya Silvas, MD;  Location: [facility] ENDOSCOPY;  Service: Endoscopy;  Laterality: N/A;  . TONSILLECTOMY      Family Psychiatric History: I have reviewed family psychiatric history from my progress note on 06/23/2017  Family History:  Family History  Problem Relation Age of Onset  . Diabetes Mother   . COPD Mother   . Kidney disease Mother   . Depression Mother   . Hypertension Mother   . Heart attack Father   . Aneurysm Father   . Anxiety disorder Sister   . Depression Sister   . Diabetes Sister   . Hypertension Sister   . Atrial fibrillation Brother   . Spinal muscular atrophy Brother   .  Breast cancer Sister   . Bone cancer Sister   . Depression Sister   . Anxiety disorder Sister   . Liver cancer Sister   . Breast cancer Sister   . Colon cancer Sister   . Depression Sister   . Diabetes Sister   . COPD Sister   . Depression Sister   . Anxiety disorder Sister   . Skin cancer Sister   . Diabetes Brother   . Depression Brother   . Skin cancer Maternal Aunt   . Diabetes Maternal Aunt   . Cervical cancer Paternal Aunt     Social History: I have reviewed social history from my progress note on 06/23/2017 Social History   Socioeconomic History  . Marital status: Widowed    Spouse name: Not on file  . Number of children: 3  . Years of education: Not on file  . Highest education level: Not on file  Occupational History  . Not on file  Tobacco Use  . Smoking status: Never Smoker  . Smokeless tobacco: Never Used  Vaping Use  . Vaping Use: Never used  Substance and Sexual Activity  . Alcohol use: No  . Drug use: No  . Sexual activity: Not Currently    Birth control/protection: Post-menopausal  Other Topics Concern  . Not on file  Social History Narrative  . Not on file   Social Determinants of Health   Financial Resource Strain: Not on file  Food Insecurity: Not on file  Transportation Needs: Not on file  Physical Activity: Not on file  Stress: Not on file  Social Connections: Not on file    Allergies:  Allergies  Allergen Reactions  . Sulfa Antibiotics Shortness Of Breath    Metabolic Disorder Labs: Lab Results  Component Value Date   HGBA1C 5.3 06/21/2016   MPG 105 06/21/2016   No results found for: PROLACTIN Lab Results  Component Value Date   CHOL 145 06/21/2016   TRIG 51 06/21/2016   HDL 44 06/21/2016   CHOLHDL 3.3 06/21/2016   VLDL 10 06/21/2016   LDLCALC 91 06/21/2016   LDLCALC 70 08/22/2013   Lab Results  Component Value Date   TSH 1.545 02/18/2018   TSH 1.683 12/18/2017    Therapeutic Level Labs: No results found for:  LITHIUM No results found for: VALPROATE No components found for:  CBMZ  Current Medications: Current Outpatient Medications  Medication Sig Dispense Refill  . aspirin-acetaminophen-caffeine (EXCEDRIN MIGRAINE) 250-250-65 MG tablet Take 1 tablet by mouth every 6 (six) hours as needed.    Marland Kitchen azelastine (ASTELIN) 0.1 % nasal spray Place 1 spray into both nostrils 2 (two) times daily. Use in each nostril as directed    . busPIRone (BUSPAR) 10 MG tablet Take 1.5 tablets (15 mg total) by mouth 3 (three) times daily. 135 tablet 0  . clonazePAM (KLONOPIN) 0.5 MG tablet TAKE ONE TABLET BY MOUTH EVERY DAY AS NEEDED FOR ANXIETY   STOP LORAZEPAM 15 tablet 1  . escitalopram (LEXAPRO) 20 MG tablet TAKE ONE TABLET BY  MOUTH EVERY DAY 90 tablet 1  . fexofenadine (ALLEGRA) 180 MG tablet Take by mouth.    . fluticasone (FLONASE) 50 MCG/ACT nasal spray Place 2 sprays into both nostrils daily.    . furosemide (LASIX) 40 MG tablet Take 1 tablet (40 mg total) by mouth daily. (Patient taking differently: TAKE ONE TABLET BY MOUTH ONCE DAILY AS NEEDED FOR EDEMA (USE DAILY AND THE SECOND DOSE AS NEEDED)) 30 tablet 0  . lactulose (CHRONULAC) 10 GM/15ML solution Take 10 g by mouth 2 (two) times daily.    Marland Kitchen levothyroxine (SYNTHROID) 50 MCG tablet Take 50 mcg by mouth every morning.    . Multiple Vitamin (MULTIVITAMIN WITH MINERALS) TABS tablet Take 1 tablet by mouth daily.    . ondansetron (ZOFRAN-ODT) 4 MG disintegrating tablet Take 4 mg by mouth every 8 (eight) hours as needed for nausea or vomiting.    Marland Kitchen QUEtiapine (SEROQUEL) 100 MG tablet TAKE 1 TABLET BY MOUTH NIGHTLY 90 tablet 1  . spironolactone (ALDACTONE) 100 MG tablet Take 1 tablet (100 mg total) by mouth daily. 30 tablet 0  . traZODone (DESYREL) 150 MG tablet TAKE ONE TABLET BY MOUTH AT BEDTIME 90 tablet 2  . XIFAXAN 550 MG TABS tablet Take 1 tablet by mouth 2 (two) times daily.    Marland Kitchen omeprazole (PRILOSEC) 40 MG capsule Take 1 capsule (40 mg total) by mouth 2  (two) times daily. 90 capsule 0   No current facility-administered medications for this visit.     Musculoskeletal: Strength & Muscle Tone: UTA Gait & Station: UTA Patient leans: N/A  Psychiatric Specialty Exam: Review of Systems  Unable to perform ROS: Dementia    Blood pressure 118/76, pulse 81, temperature 97.9 F (36.6 C), temperature source Temporal.There is no height or weight on file to calculate BMI.  General Appearance: Casual  Eye Contact:  Fair  Speech:  Clear and Coherent  Volume:  Normal  Mood:  Depressed  Affect:  Congruent  Thought Process:  Goal Directed and Descriptions of Associations: Intact  Orientation:  Other:  Month, year , location, person  Thought Content: Logical   Suicidal Thoughts:  No  Homicidal Thoughts:  No  Memory:  Immediate;   Fair Recent;   Limited Remote;   Limited  Judgement:  Impaired  Insight:  Shallow  Psychomotor Activity:  Decreased  Concentration:  Concentration: Fair and Attention Span: Fair  Recall:  [employer] of Knowledge: Limited  Language: Fair  Akathisia:  No  Handed:  Right  AIMS (if indicated): Denies tremors, rigidity, stiffness, muscle spasms  Assets:  Communication Skills Desire for Improvement Housing Social Support  ADL's:  Impaired  Cognition: Impaired,  Moderate  Sleep:  Fair   Screenings: AUDIT   Flowsheet Row Admission (Discharged) from 06/20/2016 in [place]  Alcohol Use Disorder Identification Test Final Score (AUDIT) 0    ECT-MADRS   Flowsheet Row ECT Treatment from 07/11/2016 in [place] Admission (Discharged) from 06/20/2016 in [place] Total Score 16 16    Mini-Mental   Flowsheet Row ECT Treatment from 07/11/2016 in [place] Admission (Discharged) from 06/20/2016 in [place]  Total Score (max 30 points ) 30 28       Assessment and Plan:  GENNAVIEVE HUQ is a 80 year old Caucasian female who has a history of MDD, GAD, major neurocognitive disorder, chronic liver disease with portal hypertension, hypothyroidism, GERD was evaluated  in person today.  Patient is currently struggling with depressive symptoms, does have cognitive impairment.  She will benefit from the following plan.  Plan MDD-unstable Lexapro 20 mg p.o. daily Increase BuSpar to 15 mg p.o. 3 times daily Seroquel 100 mg p.o. nightly Discussed community resources including day programs, pace program.  GAD-improving BuSpar as prescribed Klonopin as prescribed as needed for severe agitation only. Patient to limit use.   Major neurocognitive disorder-patient to continue to follow-up with primary care provider. She does have Seroquel available for mood swings.  Collateral information obtained from sister [name] as documented above.  Follow-up in clinic in 2 months or sooner if needed.  This note was generated in part or whole with voice recognition software. Voice recognition is usually quite accurate but there are transcription errors that can and very often do occur. I apologize for any typographical errors that were not detected and corrected.     Ursula Alert, MD 07/18/2020, 8:16 AM

## 2020-07-26 ENCOUNTER — Other Ambulatory Visit: Payer: Self-pay | Admitting: Psychiatry

## 2020-07-26 DIAGNOSIS — F330 Major depressive disorder, recurrent, mild: Secondary | ICD-10-CM

## 2020-07-26 DIAGNOSIS — F411 Generalized anxiety disorder: Secondary | ICD-10-CM

## 2020-08-10 ENCOUNTER — Other Ambulatory Visit: Payer: Self-pay | Admitting: Psychiatry

## 2020-08-10 DIAGNOSIS — F330 Major depressive disorder, recurrent, mild: Secondary | ICD-10-CM

## 2020-08-10 DIAGNOSIS — F411 Generalized anxiety disorder: Secondary | ICD-10-CM

## 2020-08-24 DIAGNOSIS — Z789 Other specified health status: Secondary | ICD-10-CM | POA: Insufficient documentation

## 2020-09-18 ENCOUNTER — Ambulatory Visit: Payer: Medicare Other | Admitting: Psychiatry

## 2020-09-18 ENCOUNTER — Other Ambulatory Visit: Payer: Self-pay

## 2020-10-08 ENCOUNTER — Other Ambulatory Visit: Payer: Self-pay

## 2020-10-09 ENCOUNTER — Other Ambulatory Visit: Payer: Self-pay

## 2020-10-09 ENCOUNTER — Ambulatory Visit (INDEPENDENT_AMBULATORY_CARE_PROVIDER_SITE_OTHER): Payer: Medicare Other | Admitting: Gastroenterology

## 2020-10-09 ENCOUNTER — Encounter: Payer: Self-pay | Admitting: Gastroenterology

## 2020-10-09 VITALS — BP 147/74 | HR 76 | Temp 97.9°F | Ht 62.0 in | Wt 152.2 lb

## 2020-10-09 DIAGNOSIS — K7460 Unspecified cirrhosis of liver: Secondary | ICD-10-CM | POA: Diagnosis not present

## 2020-10-09 DIAGNOSIS — R1319 Other dysphagia: Secondary | ICD-10-CM

## 2020-10-09 NOTE — Progress Notes (Signed)
Cassidy Darby, MD [address]  [place]  [place], [place] [address]  Main: [phone]  Fax: [phone]    Gastroenterology Consultation  Referring Provider:     Kirk Ruths, MD Primary Care Physician:  Kirk Ruths, MD Primary Gastroenterologist:  Dr. Cephas [name] Reason for Consultation:     Hepatic hydrothorax, decompensated cirrhosis, choking episodes        HPI:   Cassidy [name] is a 80 y.o. female referred by Dr. Ouida Sills, Ocie Cornfield, MD  for consultation & management of recent episode of food impaction.  Patient went to [facility] on 8/26 after she swallowed whole shrimp when she went outside with her sister.  She went to ER right away, did not get endoscopy with removal of food bolus.  There was evidence of irritation in distal esophagus.  No obvious stricture noted.  She has been started on omeprazole 40 mg twice daily.  Apparently, patient does not like to wear dentures even though they fit her well.  According to patient's sister, she does not to.  She likes to swallow everything.  Since last episode, she had another choking episode when she tried to eat pickled beets.  The symptoms lasted for 3 hours and spontaneously passed down at home.  Patient's sister is having hard time managing her diet, with restrictive eating, preparing her food.  She does not have any help at home except from her husband.  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  Her hemoglobin is stable.  She denies any other GI symptoms.  Patient has history of obesity, fatty liver, hypothyroidism with chronic liver disease, splenomegaly, chronic thrombocytopenia and portal hypertension   Follow-up visit 10/09/2020 Patient is now at [facility].  Patient is accompanied by her sister who is her healthcare power of attorney.  She saw me last visit for episodes of choking and started her on omeprazole 40 mg daily and she has been doing well  overall until recently.  She had an incident with solid food.  She had to insert her finger to get the food out.  Patient sister is not sure if it was intentional or if she really had a choking episode.  The group home reported episodes of regurgitation with liquids.  Patient sister states that as long as she is on a soft diet, she was doing well.  Her weight has been maintained well.  Patient denies any epigastric pain.  She reports having good bowel movements daily.  She does have worsening of dementia.  She is compliant with lactulose and rifaximin.  NSAIDs: None   Antiplts/Anticoagulants/Anti thrombotics: None   GI Procedures: She had several EGDs and colonoscopies in the past.  No known varices or portal hypertensive gastropathy Biopsies negative for H. pylori infection  EGD 12/15/2019 - Food in the distal esophagus. Removal was successful. - Erythema in the distal esophagus. - Erythematous mucosa in the antrum. - Normal examined duodenum.  EGD 12/20/2014 - Moderate Schatzki ring. Dilated. - Erythematous mucosa in the antrum. Biopsied. - Normal examined duodenum. DIAGNOSIS:  A. STOMACH; COLD BIOPSY:  - ANTRAL MUCOSA WITH FEATURES OF REACTIVE GASTROPATHY, SEE NOTE.  - OXYNTIC MUCOSA WITH FOCAL MINIMAL CHRONIC GASTRITIS.  - NEGATIVE FOR H. PYLORI, DYSPLASIA, AND MALIGNANCY  Past Medical History:  Diagnosis Date   Anxiety    Depression    Fatty liver    GERD (gastroesophageal reflux disease)    Hypothyroidism  Obesity    Thyroid disease     Past Surgical History:  Procedure Laterality Date   APPENDECTOMY     DILATATION & CURETTAGE/HYSTEROSCOPY WITH MYOSURE N/A 02/04/2018   Procedure: [place];  Surgeon: Ward, Honor Loh, MD;  Location: [facility] ORS;  Service: Gynecology;  Laterality: N/A;   ESOPHAGOGASTRODUODENOSCOPY N/A 12/15/2019   Procedure: ESOPHAGOGASTRODUODENOSCOPY (EGD);  Surgeon: Virgel Manifold, MD;  Location: [facility] ENDOSCOPY;   Service: Endoscopy;  Laterality: N/A;   ESOPHAGOGASTRODUODENOSCOPY (EGD) WITH PROPOFOL N/A 12/20/2014   Procedure: ESOPHAGOGASTRODUODENOSCOPY (EGD) WITH PROPOFOL;  Surgeon: Manya Silvas, MD;  Location: [facility] ENDOSCOPY;  Service: Endoscopy;  Laterality: N/A;   SAVORY DILATION N/A 12/20/2014   Procedure: SAVORY DILATION;  Surgeon: Manya Silvas, MD;  Location: [facility] ENDOSCOPY;  Service: Endoscopy;  Laterality: N/A;   TONSILLECTOMY       Current Outpatient Medications:    azelastine (ASTELIN) 0.1 % nasal spray, Place 1 spray into both nostrils 2 (two) times daily. Use in each nostril as directed, Disp: , Rfl:    busPIRone (BUSPAR) 15 MG tablet, Take 15 mg by mouth 3 (three) times daily., Disp: , Rfl:    clonazePAM (KLONOPIN) 0.25 MG disintegrating tablet, Take 0.25 mg by mouth 2 (two) times daily., Disp: , Rfl:    escitalopram (LEXAPRO) 20 MG tablet, TAKE ONE TABLET BY MOUTH EVERY DAY, Disp: 90 tablet, Rfl: 1   fexofenadine (ALLEGRA) 180 MG tablet, Take by mouth., Disp: , Rfl:    fluticasone (FLONASE) 50 MCG/ACT nasal spray, Place 2 sprays into both nostrils daily., Disp: , Rfl:    furosemide (LASIX) 40 MG tablet, Take 1 tablet (40 mg total) by mouth daily. (Patient taking differently: TAKE ONE TABLET BY MOUTH ONCE DAILY AS NEEDED FOR EDEMA (USE DAILY AND THE SECOND DOSE AS NEEDED)), Disp: 30 tablet, Rfl: 0   lactulose (CHRONULAC) 10 GM/15ML solution, Take 10 g by mouth 2 (two) times daily., Disp: , Rfl:    levothyroxine (SYNTHROID) 50 MCG tablet, Take 50 mcg by mouth every morning., Disp: , Rfl:    omeprazole (PRILOSEC) 40 MG capsule, Take 1 capsule (40 mg total) by mouth 2 (two) times daily., Disp: 90 capsule, Rfl: 0   ondansetron (ZOFRAN) 4 MG tablet, Take 4 mg by mouth every 8 (eight) hours as needed., Disp: , Rfl:    QUEtiapine (SEROQUEL) 100 MG tablet, TAKE 1 TABLET BY MOUTH NIGHTLY, Disp: 90 tablet, Rfl: 1   spironolactone (ALDACTONE) 100 MG tablet, Take 1 tablet (100 mg total) by mouth  daily., Disp: 30 tablet, Rfl: 0   traZODone (DESYREL) 100 MG tablet, Take 100 mg by mouth at bedtime., Disp: , Rfl:    XIFAXAN 550 MG TABS tablet, Take 1 tablet by mouth 2 (two) times daily., Disp: , Rfl:   Family History  Problem Relation Age of Onset   Diabetes Mother    COPD Mother    Kidney disease Mother    Depression Mother    Hypertension Mother    Heart attack Father    Aneurysm Father    Anxiety disorder Sister    Depression Sister    Diabetes Sister    Hypertension Sister    Atrial fibrillation Brother    Spinal muscular atrophy Brother    Breast cancer Sister    Bone cancer Sister    Depression Sister    Anxiety disorder Sister    Liver cancer Sister    Breast cancer Sister    Colon cancer Sister  Depression Sister    Diabetes Sister    COPD Sister    Depression Sister    Anxiety disorder Sister    Skin cancer Sister    Diabetes Brother    Depression Brother    Skin cancer Maternal Aunt    Diabetes Maternal Aunt    Cervical cancer Paternal Aunt      Social History   Tobacco Use   Smoking status: Never   Smokeless tobacco: Never  Vaping Use   Vaping Use: Never used  Substance Use Topics   Alcohol use: No   Drug use: No    Allergies as of 10/09/2020 - Review Complete 10/09/2020  Allergen Reaction Noted   Sulfa antibiotics Shortness Of Breath 11/27/2014    Review of Systems:    All systems reviewed and negative except where noted in HPI.   Physical Exam:  BP (!) 147/74 (BP Location: Left Arm, Patient Position: Sitting, Cuff Size: Normal)   Pulse 76   Temp 97.9 F (36.6 C) (Oral)   Ht 5' 2"  (1.575 m)   Wt 152 lb 4 oz (69.1 kg)   LMP  (LMP Unknown)   BMI 27.85 kg/m  No LMP recorded (lmp unknown). Patient is postmenopausal.  General:   Alert, moderately built, moderately nourished, pleasant and cooperative in NAD Head:  Normocephalic and atraumatic. Eyes:  Sclera clear, no icterus.   Conjunctiva pink. Ears:  Normal auditory  acuity. Nose:  No deformity, discharge, or lesions. Mouth:  No deformity or lesions,oropharynx pink & moist. Neck:  Supple; no masses or thyromegaly. Lungs:  Respirations even and unlabored.  Clear throughout to auscultation.   No wheezes, crackles, or rhonchi. No acute distress. Heart:  Regular rate and rhythm; no murmurs, clicks, rubs, or gallops. Abdomen:  Normal bowel sounds. Soft, non-tender and non-distended without masses, hepatosplenomegaly or hernias noted.  No guarding or rebound tenderness.   Rectal: Not performed Msk:  Symmetrical without gross deformities. Good, equal movement & strength bilaterally. Pulses:  Normal pulses noted. Extremities:  No clubbing or edema.  No cyanosis. Neurologic:  Alert and oriented x2;  grossly normal neurologically. Skin:  Intact without significant lesions or rashes. No jaundice.  Spontaneous bruises on her bilateral upper extremities Psych:  Alert and cooperative. Normal mood and affect.  Imaging Studies: Reviewed  Assessment and Plan:   ILEEN KAHRE is a 80 y.o. Caucasian female with obesity, fatty liver, hypothyroidism with chronic liver disease, splenomegaly, chronic thrombocytopenia and portal hypertension with recurrent right-sided pleural effusions, status post therapeutic thoracentesis in 01/2018.  Fluid analysis consistent with hepatic hydrothorax, serum-to-pleural fluid albumin gradient >1.1 Also, recent food impaction s/p EGD with food bolus removal in 11/2019  Choking episodes: History of Food impaction, she does not have any dentures Patient is not willing to make dentures Reiterated on modified diet, strongly advised on pured food, mechanical soft diet, discussed about different options EGD revealed mild esophagitis, given recurrent episodes, increase omeprazole to 40 mg 2 times daily.  I also discussed about endoscopy to rule out peptic stricture and possible dilation.  However patient's sister wants to try higher dose of  omeprazole first which I think is reasonable approach given her overall medical condition. 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  EGD on 11/2019 did not reveal any evidence of varices PSE: Continue rifaximin, MiraLAX 1 to 2 cups daily to have at least 2 soft bowel movements per day [place] screening: No evidence of liver lesions based on the CT scan in 01/2018 Recommend right upper quadrant ultrasound for  [place] screening    Follow up every 6 months  Cassidy Darby, MD

## 2020-10-09 NOTE — Patient Instructions (Addendum)
Your RUQ ultrasound is schedule for 10/30/2020 10:00am. Nothing to eat or drink after midnight. It is at out patient imaging. Address is [address], [place], [place] [address]. Phone number is [phone]

## 2020-10-10 LAB — AFP TUMOR MARKER: AFP, Serum, Tumor Marker: 2.6 ng/mL (ref 0.0–9.2)

## 2020-10-30 ENCOUNTER — Other Ambulatory Visit: Payer: Self-pay

## 2020-10-30 ENCOUNTER — Ambulatory Visit
Admission: RE | Admit: 2020-10-30 | Discharge: 2020-10-30 | Disposition: A | Discharge status: Home / Self Care | Payer: Medicare Other | Source: Ambulatory Visit | Attending: Gastroenterology | Admitting: Gastroenterology

## 2020-10-30 DIAGNOSIS — K7460 Unspecified cirrhosis of liver: Secondary | ICD-10-CM | POA: Insufficient documentation

## 2020-11-02 ENCOUNTER — Telehealth: Payer: Self-pay

## 2020-11-02 NOTE — Telephone Encounter (Signed)
[facility] radiology called with stat results of her ultrasound.  Results:  1. Interval development of nonocclusive portal vein thrombosis. 2. Cirrhotic hepatic morphology.  No focal liver lesion identified.

## 2020-11-05 ENCOUNTER — Encounter: Payer: Self-pay | Admitting: Emergency Medicine

## 2020-11-05 ENCOUNTER — Other Ambulatory Visit: Payer: Self-pay

## 2020-11-05 ENCOUNTER — Observation Stay
Admission: EM | Admit: 2020-11-05 | Discharge: 2020-11-06 | Disposition: A | Discharge status: Home / Self Care | Payer: Medicare Other | Attending: Internal Medicine | Admitting: Internal Medicine

## 2020-11-05 DIAGNOSIS — Z20822 Contact with and (suspected) exposure to covid-19: Secondary | ICD-10-CM | POA: Insufficient documentation

## 2020-11-05 DIAGNOSIS — Z79899 Other long term (current) drug therapy: Secondary | ICD-10-CM | POA: Diagnosis not present

## 2020-11-05 DIAGNOSIS — K922 Gastrointestinal hemorrhage, unspecified: Secondary | ICD-10-CM | POA: Diagnosis not present

## 2020-11-05 DIAGNOSIS — K921 Melena: Secondary | ICD-10-CM

## 2020-11-05 DIAGNOSIS — F0390 Unspecified dementia without behavioral disturbance: Secondary | ICD-10-CM | POA: Insufficient documentation

## 2020-11-05 DIAGNOSIS — I81 Portal vein thrombosis: Secondary | ICD-10-CM

## 2020-11-05 DIAGNOSIS — E039 Hypothyroidism, unspecified: Secondary | ICD-10-CM | POA: Insufficient documentation

## 2020-11-05 DIAGNOSIS — K7581 Nonalcoholic steatohepatitis (NASH): Secondary | ICD-10-CM | POA: Diagnosis present

## 2020-11-05 DIAGNOSIS — R197 Diarrhea, unspecified: Secondary | ICD-10-CM | POA: Diagnosis present

## 2020-11-05 DIAGNOSIS — D696 Thrombocytopenia, unspecified: Secondary | ICD-10-CM | POA: Diagnosis present

## 2020-11-05 DIAGNOSIS — F411 Generalized anxiety disorder: Secondary | ICD-10-CM | POA: Diagnosis present

## 2020-11-05 DIAGNOSIS — F3341 Major depressive disorder, recurrent, in partial remission: Secondary | ICD-10-CM | POA: Diagnosis not present

## 2020-11-05 DIAGNOSIS — K7460 Unspecified cirrhosis of liver: Secondary | ICD-10-CM | POA: Diagnosis present

## 2020-11-05 HISTORY — DX: Gastrointestinal hemorrhage, unspecified: K92.2

## 2020-11-05 LAB — PROTIME-INR
INR: 1.4 — ABNORMAL HIGH (ref 0.8–1.2)
Prothrombin Time: 17.3 seconds — ABNORMAL HIGH (ref 11.4–15.2)

## 2020-11-05 LAB — CBC
HCT: 28.7 % — ABNORMAL LOW (ref 36.0–46.0)
Hemoglobin: 9.4 g/dL — ABNORMAL LOW (ref 12.0–15.0)
MCH: 26.5 pg (ref 26.0–34.0)
MCHC: 32.8 g/dL (ref 30.0–36.0)
MCV: 80.8 fL (ref 80.0–100.0)
Platelets: 68 10*3/uL — ABNORMAL LOW (ref 150–400)
RBC: 3.55 MIL/uL — ABNORMAL LOW (ref 3.87–5.11)
RDW: 17.7 % — ABNORMAL HIGH (ref 11.5–15.5)
WBC: 2.2 10*3/uL — ABNORMAL LOW (ref 4.0–10.5)
nRBC: 0 % (ref 0.0–0.2)

## 2020-11-05 LAB — COMPREHENSIVE METABOLIC PANEL
ALT: 15 U/L (ref 0–44)
AST: 28 U/L (ref 15–41)
Albumin: 3.1 g/dL — ABNORMAL LOW (ref 3.5–5.0)
Alkaline Phosphatase: 72 U/L (ref 38–126)
Anion gap: 7 (ref 5–15)
BUN: 11 mg/dL (ref 8–23)
CO2: 26 mmol/L (ref 22–32)
Calcium: 9.1 mg/dL (ref 8.9–10.3)
Chloride: 102 mmol/L (ref 98–111)
Creatinine, Ser: 0.84 mg/dL (ref 0.44–1.00)
GFR, Estimated: >60 mL/min (ref >60)
Glucose, Bld: 90 mg/dL (ref 70–99)
Potassium: 4.2 mmol/L (ref 3.5–5.1)
Sodium: 135 mmol/L (ref 135–145)
Total Bilirubin: 1.1 mg/dL (ref 0.3–1.2)
Total Protein: 5.9 g/dL — ABNORMAL LOW (ref 6.5–8.1)

## 2020-11-05 LAB — GROUP A STREP BY PCR: Group A Strep by PCR: NOT DETECTED

## 2020-11-05 LAB — LIPASE, BLOOD: Lipase: 34 U/L (ref 11–51)

## 2020-11-05 LAB — APTT: aPTT: 40 seconds — ABNORMAL HIGH (ref 24–36)

## 2020-11-05 LAB — RESP PANEL BY RT-PCR (FLU A&B, COVID) ARPGX2
Influenza A by PCR: NEGATIVE
Influenza B by PCR: NEGATIVE
SARS Coronavirus 2 by RT PCR: NEGATIVE

## 2020-11-05 MED ORDER — SODIUM CHLORIDE 0.9% FLUSH
3.0000 mL | Freq: Two times a day (BID) | INTRAVENOUS | Status: DC
  Administered 2020-11-05 – 2020-11-06 (×2): 3 mL via INTRAVENOUS

## 2020-11-05 MED ORDER — PANTOPRAZOLE SODIUM 40 MG IV SOLR
40.0000 mg | Freq: Two times a day (BID) | INTRAVENOUS | Status: DC
  Administered 2020-11-06: 40 mg via INTRAVENOUS
  Filled 2020-11-05: qty 40

## 2020-11-05 MED ORDER — BUSPIRONE HCL 5 MG PO TABS
15.0000 mg | ORAL_TABLET | Freq: Three times a day (TID) | ORAL | Status: DC
  Administered 2020-11-05 – 2020-11-06 (×2): 15 mg via ORAL
  Filled 2020-11-05 (×2): qty 3

## 2020-11-05 MED ORDER — LORATADINE 10 MG PO TABS
10.0000 mg | ORAL_TABLET | Freq: Every day | ORAL | Status: DC
  Administered 2020-11-06: 10 mg via ORAL
  Filled 2020-11-05: qty 1

## 2020-11-05 MED ORDER — ESCITALOPRAM OXALATE 10 MG PO TABS
20.0000 mg | ORAL_TABLET | Freq: Every day | ORAL | Status: DC
  Administered 2020-11-06: 20 mg via ORAL
  Filled 2020-11-05: qty 2

## 2020-11-05 MED ORDER — PANTOPRAZOLE SODIUM 40 MG IV SOLR
40.0000 mg | Freq: Once | INTRAVENOUS | Status: AC
  Administered 2020-11-05: 40 mg via INTRAVENOUS
  Filled 2020-11-05: qty 40

## 2020-11-05 MED ORDER — ACETAMINOPHEN 325 MG PO TABS: 650.0000 mg | ORAL_TABLET | Freq: Four times a day (QID) | ORAL | Status: DC | PRN

## 2020-11-05 MED ORDER — AZELASTINE HCL 0.1 % NA SOLN
1.0000 | Freq: Two times a day (BID) | NASAL | Status: DC
  Administered 2020-11-06: 1 via NASAL
  Filled 2020-11-05: qty 30

## 2020-11-05 MED ORDER — SPIRONOLACTONE 100 MG PO TABS
100.0000 mg | ORAL_TABLET | Freq: Every day | ORAL | Status: DC
  Administered 2020-11-06: 100 mg via ORAL
  Filled 2020-11-05: qty 1

## 2020-11-05 MED ORDER — FLUTICASONE PROPIONATE 50 MCG/ACT NA SUSP
2.0000 | Freq: Every day | NASAL | Status: DC
  Administered 2020-11-06: 2 via NASAL
  Filled 2020-11-05: qty 16

## 2020-11-05 MED ORDER — ACETAMINOPHEN 650 MG RE SUPP: 650.0000 mg | Freq: Four times a day (QID) | RECTAL | Status: DC | PRN

## 2020-11-05 MED ORDER — QUETIAPINE FUMARATE 25 MG PO TABS
100.0000 mg | ORAL_TABLET | Freq: Every day | ORAL | Status: DC
  Administered 2020-11-05: 100 mg via ORAL
  Filled 2020-11-05: qty 4

## 2020-11-05 MED ORDER — TRAZODONE HCL 100 MG PO TABS
100.0000 mg | ORAL_TABLET | Freq: Every day | ORAL | Status: DC
  Administered 2020-11-05: 100 mg via ORAL
  Filled 2020-11-05: qty 1

## 2020-11-05 MED ORDER — LACTATED RINGERS IV BOLUS
1000.0000 mL | Freq: Once | INTRAVENOUS | Status: AC
  Administered 2020-11-05: 1000 mL via INTRAVENOUS

## 2020-11-05 MED ORDER — OXYCODONE HCL 5 MG PO TABS
5.0000 mg | ORAL_TABLET | Freq: Once | ORAL | Status: AC
  Administered 2020-11-05: 5 mg via ORAL
  Filled 2020-11-05: qty 1

## 2020-11-05 MED ORDER — CLONAZEPAM 0.25 MG PO TBDP: 0.2500 mg | ORAL_TABLET | Freq: Two times a day (BID) | ORAL | Status: DC | PRN

## 2020-11-05 MED ORDER — LEVOTHYROXINE SODIUM 50 MCG PO TABS
50.0000 ug | ORAL_TABLET | Freq: Every day | ORAL | Status: DC
  Administered 2020-11-06: 50 ug via ORAL
  Filled 2020-11-05: qty 1

## 2020-11-05 NOTE — ED Triage Notes (Signed)
Pt to ER via EMS from [facility].  Pt reports diarrhea for last 5 days and reports of sore throat.  Pt reports [place] at GI doc 2 weeks ago and told today that she may have a liver blockage.  Pt unsure why they did the [place].

## 2020-11-05 NOTE — Telephone Encounter (Signed)
Called patient's sister Cassidy [name] who is her power of attorney to discuss about ultrasound results.  She reported that [name] has been experiencing diarrhea associated with poor p.o. intake for last 2 days.  She is receiving Imodium and Tylenol at [facility].  Her diarrhea has not resolved.  I suggested patient's sister to take her to the emergency room to check electrolytes and other labs.  I also discussed with her about the new ultrasound findings.  I will review further with the radiologist before giving any recommendations.  Patient does not have any history of varices.  She has been doing well clinically from cirrhosis standpoint.  Patient was previously seen by Dr. Tasia Catchings, hematologist in [address] [place] [address]  [place]  [place], [place] [address]  Main: [phone]  Fax: [phone] Pager: [phone]

## 2020-11-05 NOTE — ED Notes (Addendum)
Call   [name] HARRISON (Sister [place]) at  [phone] with any updates.

## 2020-11-05 NOTE — H&P (Signed)
History and Physical   Cassidy [name] NID:[id number] DOB: February 28, 1941 DOA: 11/05/2020  PCP: Cassidy Ruths, MD   [name]  Chief Complaint: Diarrhea  HPI: Cassidy [name] is a 80 y.o. female with medical history significant of hypothyroidism, arthritis, chronic pain, depression, anxiety, esophageal stenosis, cirrhosis secondary to [facility], thrombocytopenia, GERD, dementia who presents with ongoing diarrhea. Patient's sister is at bedside and assists in providing history.  Patient has had around 5 days of diarrhea.  Reportedly it started with 2 days of melanotic stools which have been improving and now stools mostly brown.  Reports 5-10 episodes of diarrhea a day for the past 4 days.  She also is reporting 2 days of sore throat.  Denies any stuck sensation when she swallows (has history of esophageal stenosis as above). Sister reports that staff patient's facility had tried stopping her lactulose for couple days but it did not have any effect on her diarrhea.  Also reporting some decreased p.o. intake. She denies fevers, chills, chest pain, shortness of breath, abdominal pain, nausea.   ED Course: Vital signs in the ED are stable blood pressure 144R to 154M systolic.  Lab work-up showed CMP with protein 5.9 and albumin 3.1.  CBC with hemoglobin 9.4 down from baseline in the 11's.  Leukopenia at 2.2, thrombocytopenia with platelets stable at 68.  Elevated PT at 17.3 elevated INR at 1.4 and elevated PTT at 40.  Lipase normal.  Negative for group A strep.  Respiratory panel for flu and COVID-negative.  FOBT attempted by EDP however there is no stool in the rectal vault.  C. difficile panel ordered and pending.  No imaging here however did have a recent abdominal ultrasound 6 days ago ordered by her gastroenterologist which showed nonocclusive portal vein thrombosis and changes of cirrhosis.  Patient started on IV PPI in the ED and given a 1 L bolus.  Review of Systems: As per  HPI otherwise all other systems reviewed and are negative.  Past Medical History:  Diagnosis Date   Anxiety    Depression    Fatty liver    GERD (gastroesophageal reflux disease)    Hypothyroidism    Obesity    Thyroid disease     Past Surgical History:  Procedure Laterality Date   APPENDECTOMY     DILATATION & CURETTAGE/HYSTEROSCOPY WITH MYOSURE N/A 02/04/2018   Procedure: [place];  Surgeon: Ward, Honor Loh, MD;  Location: [facility] ORS;  Service: Gynecology;  Laterality: N/A;   ESOPHAGOGASTRODUODENOSCOPY N/A 12/15/2019   Procedure: ESOPHAGOGASTRODUODENOSCOPY (EGD);  Surgeon: Virgel Manifold, MD;  Location: [facility] ENDOSCOPY;  Service: Endoscopy;  Laterality: N/A;   ESOPHAGOGASTRODUODENOSCOPY (EGD) WITH PROPOFOL N/A 12/20/2014   Procedure: ESOPHAGOGASTRODUODENOSCOPY (EGD) WITH PROPOFOL;  Surgeon: Manya Silvas, MD;  Location: [facility] ENDOSCOPY;  Service: Endoscopy;  Laterality: N/A;   SAVORY DILATION N/A 12/20/2014   Procedure: SAVORY DILATION;  Surgeon: Manya Silvas, MD;  Location: [facility] ENDOSCOPY;  Service: Endoscopy;  Laterality: N/A;   TONSILLECTOMY      Social History  reports that she has never smoked. She has never used smokeless tobacco. She reports that she does not drink alcohol and does not use drugs.  Allergies  Allergen Reactions   Sulfa Antibiotics Shortness Of Breath    Family History  Problem Relation Age of Onset   Diabetes Mother    COPD Mother    Kidney disease Mother    Depression Mother    Hypertension Mother  Heart attack Father    Aneurysm Father    Anxiety disorder Sister    Depression Sister    Diabetes Sister    Hypertension Sister    Atrial fibrillation Brother    Spinal muscular atrophy Brother    Breast cancer Sister    Bone cancer Sister    Depression Sister    Anxiety disorder Sister    Liver cancer Sister    Breast cancer Sister    Colon cancer Sister    Depression Sister    Diabetes  Sister    COPD Sister    Depression Sister    Anxiety disorder Sister    Skin cancer Sister    Diabetes Brother    Depression Brother    Skin cancer Maternal Aunt    Diabetes Maternal Aunt    Cervical cancer Paternal Aunt   Reviewed on admission  Prior to Admission medications   Medication Sig Start Date End Date Taking? Authorizing Provider  azelastine (ASTELIN) 0.1 % nasal spray Place 1 spray into both nostrils 2 (two) times daily. Use in each nostril as directed    [provider]  busPIRone (BUSPAR) 15 MG tablet Take 15 mg by mouth 3 (three) times daily. 09/14/20   [provider]  clonazePAM (KLONOPIN) 0.25 MG disintegrating tablet Take 0.25 mg by mouth 2 (two) times daily. 08/24/20   [provider]  escitalopram (LEXAPRO) 20 MG tablet TAKE ONE TABLET BY MOUTH EVERY DAY 07/26/20   Ursula Alert, MD  fexofenadine (ALLEGRA) 180 MG tablet Take by mouth.    [provider]  fluticasone (FLONASE) 50 MCG/ACT nasal spray Place 2 sprays into both nostrils daily. 05/18/20   [provider]  furosemide (LASIX) 40 MG tablet Take 1 tablet (40 mg total) by mouth daily. Patient taking differently: TAKE ONE TABLET BY MOUTH ONCE DAILY AS NEEDED FOR EDEMA (USE DAILY AND THE SECOND DOSE AS NEEDED) 02/19/18   Mayo, Pete Pelt, MD  lactulose (CHRONULAC) 10 GM/15ML solution Take 10 g by mouth 2 (two) times daily.    [provider]  levothyroxine (SYNTHROID) 50 MCG tablet Take 50 mcg by mouth every morning. 05/24/19   [provider]  omeprazole (PRILOSEC) 40 MG capsule Take 1 capsule (40 mg total) by mouth 2 (two) times daily. 12/15/19 10/09/20  Virgel Manifold, MD  ondansetron (ZOFRAN) 4 MG tablet Take 4 mg by mouth every 8 (eight) hours as needed. 09/05/20   [provider]  QUEtiapine (SEROQUEL) 100 MG tablet TAKE 1 TABLET BY MOUTH NIGHTLY 05/18/20   Ursula Alert, MD  spironolactone (ALDACTONE) 100 MG tablet Take 1 tablet (100 mg  total) by mouth daily. 02/19/18   Mayo, Pete Pelt, MD  traZODone (DESYREL) 100 MG tablet Take 100 mg by mouth at bedtime. 10/05/20   [provider]  XIFAXAN 550 MG TABS tablet Take 1 tablet by mouth 2 (two) times daily. 10/21/17   [provider]    Physical Exam: Vitals:   11/05/20 1709 11/05/20 1930 11/05/20 2030 11/05/20 2130  BP: (!) 111/50 (!) 113/57 126/61 138/71  Pulse: 78 78 84 85  Resp: 18 18 18 18   Temp: 98.5 F (36.9 C)     TempSrc: Oral     SpO2: 96% 96% 96% 98%  Weight: 69.9 kg     Height: 5' 2"  (1.575 m)      Physical Exam Constitutional:      General: She is not in acute distress.    Appearance:  Normal appearance.     Comments: Elderly female  HENT:     Head: Normocephalic and atraumatic.     Mouth/Throat:     Mouth: Mucous membranes are moist.     Pharynx: Oropharynx is clear.  Eyes:     Extraocular Movements: Extraocular movements intact.     Pupils: Pupils are equal, round, and reactive to light.  Cardiovascular:     Rate and Rhythm: Normal rate and regular rhythm.     Pulses: Normal pulses.     Heart sounds: Normal heart sounds.  Pulmonary:     Effort: Pulmonary effort is normal. No respiratory distress.     Breath sounds: Normal breath sounds.  Abdominal:     General: Bowel sounds are normal. There is no distension.     Palpations: Abdomen is soft.     Tenderness: There is no abdominal tenderness.  Musculoskeletal:        General: No swelling or deformity.  Skin:    General: Skin is warm and dry.  Neurological:     General: No focal deficit present.     Mental Status: Mental status is at baseline.   Labs on Admission: I have personally reviewed following labs and imaging studies  CBC: Recent Labs  Lab 11/05/20 1719  WBC 2.2*  HGB 9.4*  HCT 28.7*  MCV 80.8  PLT 68*    Basic Metabolic Panel: Recent Labs  Lab 11/05/20 1719  NA 135  K 4.2  CL 102  CO2 26  GLUCOSE 90  BUN 11  CREATININE 0.84  CALCIUM 9.1     GFR: Estimated Creatinine Clearance: 49.7 mL/min (by C-G formula based on SCr of 0.84 mg/dL).  Liver Function Tests: Recent Labs  Lab 11/05/20 1719  AST 28  ALT 15  ALKPHOS 72  BILITOT 1.1  PROT 5.9*  ALBUMIN 3.1*    Urine analysis:    Component Value Date/Time   COLORURINE AMBER (A) 02/01/2018 1705   APPEARANCEUR CLEAR (A) 02/01/2018 1705   APPEARANCEUR Clear 08/21/2013 1035   LABSPEC 1.014 02/01/2018 1705   LABSPEC 1.021 08/21/2013 1035   PHURINE 6.0 02/01/2018 1705   GLUCOSEU NEGATIVE 02/01/2018 1705   GLUCOSEU Negative 08/21/2013 1035   HGBUR LARGE (A) 02/01/2018 1705   BILIRUBINUR NEGATIVE 02/01/2018 1705   BILIRUBINUR Negative 08/21/2013 1035   [place] 02/01/2018 1705   PROTEINUR NEGATIVE 02/01/2018 1705   NITRITE NEGATIVE 02/01/2018 1705   LEUKOCYTESUR NEGATIVE 02/01/2018 1705   LEUKOCYTESUR Negative 08/21/2013 1035    Radiological Exams on Admission: No results found.  EKG: Not yet performed  Assessment/Plan Principal Problem:   GI bleed Active Problems:   GAD (generalized anxiety disorder)   Adult hypothyroidism   Depression, major, recurrent, in partial remission (HCC)   Thrombocytopenia (HCC)   Liver cirrhosis secondary to NASH ([place])   Dementia arising in the senium and presenium ([place])   Intractable diarrhea   GERD GI bleeding Intractable diarrhea > Patient with 5 days of significant diarrhea and 5-10 episodes for the last 4 days. > Initially stools were melanotic but are now more brown in nature.  Did have a hemoglobin drop from the 11's to 9.4. > Also recently noted to have nonocclusive portal vein thrombosis on recent outpatient ultrasound ordered by GI.  In the setting of cirrhosis as below. > Has continued to take lactulose however staff at held at her facility for 2 days with no effect on her increased diarrhea. > C. difficile panel ordered in the ED. -  Monitor on telemetry - We will consult GI as below for questionable  resolving GI bleed in the setting of nonocclusive portal vein thrombosis as the risks and benefits of anticoagulation will need to be weighed.  Message has been sent to Dr. Haig Prophet. - Continue with IV PPI - Follow-up C. difficile panel, could consider GI pathogen panel if negative. - Received 1 L of IV fluids in the ED.  We will not give additional fluids given cirrhosis and normal renal function - Trend CBC - We will order for FOBT to be performed on next stool sample  Cirrhosis > Secondary to NASH > Recent ultrasound showing nonocclusive portal vein thrombosis.  Complicated by suspicion for recent resolving versus ongoing GI bleed as above. > Platelets stable at 68, PT 17.3, INR 1.4, PTT 40, normal sodium.  MELD around 10 - Consulting GI as above.  Appreciate recommendations - Continue with spironolactone, holding home Lasix - Holding home lactulose and rifaximin in the setting of intractable diarrhea as above - Trend CMP  Sore throat > The past 2 days.  Strep negative in the ED.  No stuck sensation which was evaluated due to her history of esophageal stenosis. - Continue to monitor  Depression Anxiety - Continue home BuSpar, Lexapro, Seroquel, trazodone - Continue as needed Klonopin  Hypothyroidism - Continue home Synthroid  Chronic pain - Received dose of oxycodone in the ED, not vomiting chronic pain medication outpatient that I can see.   DVT prophylaxis: SCDs  Code Status:   DNR, had discussion with patient and sister in the room.  Both agreed upon patient being DNR and DNI. Family Communication:  Sister who is POA updated at bedside Disposition Plan:   Patient is from:  [place] to:  [facility] DC date:  1 to 3 days  Anticipated DC barriers: None  Consults called:  Message sent to GI, Dr. Haig Prophet, for consultation in the morning  Admission status:  Observation, MedSurg with telemetry   Severity of Illness: The appropriate  patient status for this patient is OBSERVATION. Observation status is judged to be reasonable and necessary in order to provide the required intensity of service to ensure the patient's safety. The patient's presenting symptoms, physical exam findings, and initial radiographic and laboratory data in the context of their medical condition is felt to place them at decreased risk for further clinical deterioration. Furthermore, it is anticipated that the patient will be medically stable for discharge from the hospital within 2 midnights of admission. The following factors support the patient status of observation.   " The patient's presenting symptoms include diarrhea, melanotic stool, sore throat. " The physical exam findings include only female, no acute distress. " The initial radiographic and laboratory data are  Lab work-up showed CMP with protein 5.9 and albumin 3.1.  CBC with hemoglobin 9.4 down from baseline in the 11's.  Leukopenia at 2.2, thrombocytopenia with platelets stable at 68.  Elevated PT at 17.3 elevated INR at 1.4 and elevated PTT at 40.  Lipase normal.  Negative for group A strep.  Respiratory panel for flu and COVID-negative.  FOBT attempted by EDP however there is no stool in the rectal vault.  C. difficile panel ordered and pending.  No imaging here however did have a recent abdominal ultrasound 6 days ago ordered by her gastroenterologist which showed nonocclusive portal vein thrombosis and changes of cirrhosis.   Marcelyn Bruins MD [facility]  How to contact the [facility]  Attending or Consulting provider [employer] or covering provider during after hours [place], for this patient?   Check the care team in [facility] and look for a) attending/consulting [facility] provider listed and b) the [facility] team listed Log into [email or web address] and use [place]'s universal password to access. If you do not have the password, please contact the hospital operator. Locate the [facility] provider you are looking for  under [facility] and page to a number that you can be directly reached. If you still have difficulty reaching the provider, please page the [facility] (Director on Call) for the Hospitalists listed on amion for assistance.  11/05/2020, 9:50 PM

## 2020-11-05 NOTE — ED Notes (Signed)
ED Provider at bedside. 

## 2020-11-05 NOTE — ED Provider Notes (Signed)
[facility] Emergency Department Provider Note ____________________________________________   Event Date/Time   First MD Initiated Contact with Patient 11/05/20 1720     (approximate)  I have reviewed the triage vital signs and the nursing notes.  HISTORY  Chief Complaint Diarrhea   HPI Cassidy [name] is a 80 y.o. femalewho presents to the ED for evaluation of diarrhea.   Chart review indicates history of cirrhosis and fatty liver disease, hypothyroidism.  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  Pt presents to the ED for evaluation of 4 days of diarrhea. Started with melanotic stool, for 2 days, then turning more brown.  She reports continued loose stools slow resolution of her melena.  Denies hematochezia.  She reports 5-10 episodes per day for the past 4 days.  She denies any nausea, abdominal pain, emesis, dysuria, hematuria.  Also reporting 2 days of sore throat and odynophagia.  Denies any drooling, difficulty swallowing, voice changes, drooling.  Denies any sensation of foreign body or stuck food.  Denies ear pain, nasal congestion.   Further reports that she does not even know why that she is here, she feels fine other than needing to pass another liquid bowel movements.  Past Medical History:  Diagnosis Date   Anxiety    Depression    Fatty liver    GERD (gastroesophageal reflux disease)    Hypothyroidism    Obesity    Thyroid disease     Patient Active Problem List   Diagnosis Date Noted   GI bleed 11/05/2020   Intractable diarrhea 11/05/2020   Resides in skilled nursing facility 08/24/2020   Acute hypoxemic respiratory failure ([place]) 12/15/2019   Esophageal obstruction due to food impaction    Stomach irritation    Foreign body in esophagus    MDD (major depressive disorder), recurrent, in full remission  ([place]) 09/08/2019   Insomnia due to mental disorder 09/08/2019   Major neurocognitive disorder due to multiple etiologies ([place]) 09/08/2019   Uses walker 11/24/2018   Dementia arising in the senium and presenium ([place]) 11/24/2018   General weakness 05/08/2018   Edema of upper extremity 05/08/2018   Liver cirrhosis secondary to NASH ([place]) 03/08/2018   Pleural effusion on right 02/13/2018   Protein-calorie malnutrition, severe 02/04/2018   Weight loss 01/19/2017   Severe recurrent major depression without psychotic features ([place]) 06/19/2016   Hepatic encephalopathy ([place]) 08/13/2015   Thrombocytopenia ([place]) 07/23/2015   Amnesia 03/20/2015   Major depression in remission ([place]) 03/12/2015   Gonalgia 01/05/2015   Arthritis 11/27/2014   Depression, major, recurrent, in partial remission ([place]) 11/27/2014   Encounter for general adult medical examination without abnormal findings 08/25/2014   Chronic LBP 08/15/2014   Complete rotator cuff rupture of left shoulder 05/01/2014   Infraspinatus tenosynovitis 05/01/2014   Other synovitis and tenosynovitis, right shoulder 05/01/2014   Difficulty in walking 11/30/2013   Arthritis, degenerative 10/01/2013   Steatohepatitis 10/01/2013   Orthostasis 10/01/2013   Appendicular ataxia 09/26/2013   Fall 09/26/2013   Osteoporosis with fracture 09/06/2013   Clinical depression 03/25/2012   Adult hypothyroidism 03/25/2012   Esophageal stenosis 07/03/2009   Back ache 03/08/2003   GAD (generalized anxiety disorder) 12/27/2002    Past Surgical History:  Procedure Laterality Date   APPENDECTOMY     DILATATION & CURETTAGE/HYSTEROSCOPY WITH MYOSURE N/A 02/04/2018   Procedure: DILATATION & CURETTAGE/HYSTEROSCOPY WITH MYOSURE;  Surgeon:  Ward, Honor Loh, MD;  Location: [facility] ORS;  Service: Gynecology;  Laterality: N/A;   ESOPHAGOGASTRODUODENOSCOPY N/A 12/15/2019   Procedure: ESOPHAGOGASTRODUODENOSCOPY (EGD);  Surgeon: Virgel Manifold, MD;  Location: [facility]  ENDOSCOPY;  Service: Endoscopy;  Laterality: N/A;   ESOPHAGOGASTRODUODENOSCOPY (EGD) WITH PROPOFOL N/A 12/20/2014   Procedure: ESOPHAGOGASTRODUODENOSCOPY (EGD) WITH PROPOFOL;  Surgeon: Manya Silvas, MD;  Location: [facility] ENDOSCOPY;  Service: Endoscopy;  Laterality: N/A;   SAVORY DILATION N/A 12/20/2014   Procedure: SAVORY DILATION;  Surgeon: Manya Silvas, MD;  Location: [facility] ENDOSCOPY;  Service: Endoscopy;  Laterality: N/A;   TONSILLECTOMY      Prior to Admission medications   Medication Sig Start Date End Date Taking? Authorizing Provider  azelastine (ASTELIN) 0.1 % nasal spray Place 1 spray into both nostrils 2 (two) times daily. Use in each nostril as directed   Yes [provider]  busPIRone (BUSPAR) 15 MG tablet Take 15 mg by mouth 3 (three) times daily. 09/14/20  Yes [provider]  clonazePAM (KLONOPIN) 0.25 MG disintegrating tablet Take 0.25 mg by mouth 2 (two) times daily. 08/24/20  Yes [provider]  escitalopram (LEXAPRO) 20 MG tablet TAKE ONE TABLET BY MOUTH EVERY DAY 07/26/20  Yes Eappen, Ria Clock, MD  fexofenadine (ALLEGRA) 180 MG tablet Take 180 mg by mouth daily.   Yes [provider]  fluticasone (FLONASE) 50 MCG/ACT nasal spray Place 2 sprays into both nostrils daily. 05/18/20  Yes [provider]  lactulose (CHRONULAC) 10 GM/15ML solution Take 10 g by mouth 2 (two) times daily.   Yes [provider]  levothyroxine (SYNTHROID) 50 MCG tablet Take 50 mcg by mouth every morning. 05/24/19  Yes [provider]  omeprazole (PRILOSEC) 40 MG capsule Take 1 capsule (40 mg total) by mouth 2 (two) times daily. 12/15/19 11/05/20 Yes Virgel Manifold, MD  QUEtiapine (SEROQUEL) 100 MG tablet TAKE 1 TABLET BY MOUTH NIGHTLY 05/18/20  Yes Eappen, Ria Clock, MD  spironolactone (ALDACTONE) 100 MG tablet Take 1 tablet (100 mg total) by mouth daily. 02/19/18  Yes Mayo, Pete Pelt, MD  traZODone (DESYREL) 100 MG tablet Take 100 mg by mouth at  bedtime. 10/05/20  Yes [provider]  XIFAXAN 550 MG TABS tablet Take 1 tablet by mouth 2 (two) times daily. 10/21/17  Yes [provider]  ondansetron (ZOFRAN) 4 MG tablet Take 4 mg by mouth every 8 (eight) hours as needed. 09/05/20   [provider]    Allergies Sulfa antibiotics  Family History  Problem Relation Age of Onset   Diabetes Mother    COPD Mother    Kidney disease Mother    Depression Mother    Hypertension Mother    Heart attack Father    Aneurysm Father    Anxiety disorder Sister    Depression Sister    Diabetes Sister    Hypertension Sister    Atrial fibrillation Brother    Spinal muscular atrophy Brother    Breast cancer Sister    Bone cancer Sister    Depression Sister    Anxiety disorder Sister    Liver cancer Sister    Breast cancer Sister    Colon cancer Sister    Depression Sister    Diabetes Sister    COPD Sister    Depression Sister    Anxiety disorder Sister    Skin cancer Sister    Diabetes Brother    Depression Brother    Skin cancer Maternal Aunt    Diabetes Maternal  Aunt    Cervical cancer Paternal Aunt     Social History Social History   Tobacco Use   Smoking status: Never   Smokeless tobacco: Never  Vaping Use   Vaping Use: Never used  Substance Use Topics   Alcohol use: No   Drug use: No    Review of Systems  Constitutional: No fever/chills Eyes: No visual changes. ENT: Positive for sore throat. Cardiovascular: Denies chest pain. Respiratory: Denies shortness of breath. Gastrointestinal: No abdominal pain.  No nausea, no vomiting.   No constipation. Positive for diarrhea and melena Genitourinary: Negative for dysuria. Musculoskeletal: Negative for back pain. Skin: Negative for rash. Neurological: Negative for headaches, focal weakness or numbness.  ____________________________________________   PHYSICAL EXAM:  VITAL SIGNS: Vitals:   11/05/20 2030 11/05/20 2130  BP: 126/61 138/71   Pulse: 84 85  Resp: 18 18  Temp:    SpO2: 96% 98%    Constitutional: Alert and oriented to person, location, situation and year. Well appearing and in no acute distress.  Pleasant and conversational. Eyes: Conjunctivae are normal. PERRL. EOMI. Head: Atraumatic. Nose: No congestion/rhinnorhea. Mouth/Throat: Mucous membranes are moist.  Oropharynx non-erythematous. Neck: No stridor. No cervical spine tenderness to palpation. Cardiovascular: Normal rate, regular rhythm. Grossly normal heart sounds.  Good peripheral circulation. Respiratory: Normal respiratory effort.  No retractions. Lungs CTAB. Gastrointestinal: Soft , nondistended, nontender to palpation. No CVA tenderness.  Benign throughout. Musculoskeletal: No lower extremity tenderness nor edema.  No joint effusions. No signs of acute trauma. Neurologic:  Normal speech and language. No gross focal neurologic deficits are appreciated. No gait instability noted. Skin:  Skin is warm, dry and intact. No rash noted. Psychiatric: Mood and affect are normal. Speech and behavior are normal. ____________________________________________   LABS (all labs ordered are listed, but only abnormal results are displayed)  Labs Reviewed  COMPREHENSIVE METABOLIC PANEL - Abnormal; Notable for the following components:      Result Value   Total Protein 5.9 (*)    Albumin 3.1 (*)    All other components within normal limits  CBC - Abnormal; Notable for the following components:   WBC 2.2 (*)    RBC 3.55 (*)    Hemoglobin 9.4 (*)    HCT 28.7 (*)    RDW 17.7 (*)    Platelets 68 (*)    All other components within normal limits  PROTIME-INR - Abnormal; Notable for the following components:   Prothrombin Time 17.3 (*)    INR 1.4 (*)    All other components within normal limits  APTT - Abnormal; Notable for the following components:   aPTT 40 (*)    All other components within normal limits  RESP PANEL BY RT-PCR (FLU A&B, COVID) ARPGX2  GROUP A  STREP BY PCR  C DIFFICILE QUICK SCREEN W PCR REFLEX    LIPASE, BLOOD  URINALYSIS, COMPLETE (UACMP) WITH MICROSCOPIC  COMPREHENSIVE METABOLIC PANEL  CBC  PROTIME-INR  OCCULT BLOOD X 1 CARD TO LAB, STOOL   ____________________________________________  12 Lead EKG   ____________________________________________  RADIOLOGY  ED MD interpretation: Outpatient RUQ ultrasound reviewed with nonobstructive portal vein thrombosis  Official radiology report(s): No results found.  ____________________________________________   PROCEDURES and INTERVENTIONS  Procedure(s) performed (including Critical Care):  .1-3 Lead EKG Interpretation  Date/Time: 11/05/2020 6:44 PM Performed by: Vladimir Crofts, MD Authorized by: Vladimir Crofts, MD     Interpretation: normal     ECG rate:  74   ECG rate assessment: normal  Rhythm: sinus rhythm     Ectopy: none     Conduction: normal    Medications  pantoprazole (PROTONIX) injection 40 mg (has no administration in time range)  spironolactone (ALDACTONE) tablet 100 mg (has no administration in time range)  busPIRone (BUSPAR) tablet 15 mg (15 mg Oral Given 11/05/20 2215)  escitalopram (LEXAPRO) tablet 20 mg (has no administration in time range)  QUEtiapine (SEROQUEL) tablet 100 mg (100 mg Oral Given 11/05/20 2215)  traZODone (DESYREL) tablet 100 mg (100 mg Oral Given 11/05/20 2215)  levothyroxine (SYNTHROID) tablet 50 mcg (has no administration in time range)  azelastine (ASTELIN) 0.1 % nasal spray 1 spray (has no administration in time range)  loratadine (CLARITIN) tablet 10 mg (has no administration in time range)  fluticasone (FLONASE) 50 MCG/ACT nasal spray 2 spray (has no administration in time range)  sodium chloride flush (NS) 0.9 % injection 3 mL (3 mLs Intravenous Given 11/05/20 2214)  acetaminophen (TYLENOL) tablet 650 mg (has no administration in time range)    Or  acetaminophen (TYLENOL) suppository 650 mg (has no administration in time  range)  clonazePAM (KLONOPIN) disintegrating tablet 0.25 mg (has no administration in time range)  lactated ringers bolus 1,000 mL (0 mLs Intravenous Stopped 11/05/20 2104)  pantoprazole (PROTONIX) injection 40 mg (40 mg Intravenous Given 11/05/20 2115)  oxyCODONE (Oxy IR/ROXICODONE) immediate release tablet 5 mg (5 mg Oral Given 11/05/20 2114)    ____________________________________________   MDM / ED COURSE   80 year old woman with known cirrhosis presents to the ED with melanotic diarrhea concern for upper GI bleed requiring medical admission.  Normal vitals.  Blood work with two-point hemoglobin drop from recent reading.  LFTs and electrolytes are unremarkable.  No abdominal pain or tenderness to necessitate CT imaging of her abdomen at this time.  Considering her outpatient ultrasound with new clots and her symptoms today of bleeding, as well as her hemoglobin drop, we will admit to medicine for further monitoring of her hemoglobins and discussion of the case with GI in the morning   Clinical Course as of 11/06/20 0019  Mon Nov 05, 2020  2009 Reassessed.  Sister at the bedside.  Rectal exam performed, chaperoned by [facility] rn.  We discussed plan of care and observation admission.  They are in agreement. [DS]  2139 I discuss with Dr. Trilby Drummer, who agrees to admit [DS]    Clinical Course User Index [DS] Vladimir Crofts, MD    ____________________________________________   FINAL CLINICAL IMPRESSION(S) / ED DIAGNOSES  Final diagnoses:  Diarrhea, unspecified type  Melena     ED Discharge Orders     None        Cherylin [place]agues[place] Tamala Julian   Note:  This document was prepared using Dragon voice recognition software and may include unintentional dictation errors.    Vladimir Crofts, MD 11/06/20 [phone]

## 2020-11-05 NOTE — ED Notes (Signed)
Admitting at bedside 

## 2020-11-05 NOTE — ED Notes (Signed)
Assisted pt to RR at this time.

## 2020-11-05 NOTE — ED Notes (Signed)
Report received from Jennifer, RN

## 2020-11-06 DIAGNOSIS — R197 Diarrhea, unspecified: Secondary | ICD-10-CM

## 2020-11-06 LAB — MAGNESIUM: Magnesium: 1.7 mg/dL (ref 1.7–2.4)

## 2020-11-06 LAB — CBC
HCT: 28.4 % — ABNORMAL LOW (ref 36.0–46.0)
Hemoglobin: 9.1 g/dL — ABNORMAL LOW (ref 12.0–15.0)
MCH: 26 pg (ref 26.0–34.0)
MCHC: 32 g/dL (ref 30.0–36.0)
MCV: 81.1 fL (ref 80.0–100.0)
Platelets: 58 10*3/uL — ABNORMAL LOW (ref 150–400)
RBC: 3.5 MIL/uL — ABNORMAL LOW (ref 3.87–5.11)
RDW: 18 % — ABNORMAL HIGH (ref 11.5–15.5)
WBC: 2 10*3/uL — ABNORMAL LOW (ref 4.0–10.5)
nRBC: 0 % (ref 0.0–0.2)

## 2020-11-06 LAB — COMPREHENSIVE METABOLIC PANEL
ALT: 14 U/L (ref 0–44)
AST: 23 U/L (ref 15–41)
Albumin: 3 g/dL — ABNORMAL LOW (ref 3.5–5.0)
Alkaline Phosphatase: 59 U/L (ref 38–126)
Anion gap: 8 (ref 5–15)
BUN: 11 mg/dL (ref 8–23)
CO2: 27 mmol/L (ref 22–32)
Calcium: 8.7 mg/dL — ABNORMAL LOW (ref 8.9–10.3)
Chloride: 103 mmol/L (ref 98–111)
Creatinine, Ser: 0.8 mg/dL (ref 0.44–1.00)
GFR, Estimated: >60 mL/min (ref >60)
Glucose, Bld: 86 mg/dL (ref 70–99)
Potassium: 4.3 mmol/L (ref 3.5–5.1)
Sodium: 138 mmol/L (ref 135–145)
Total Bilirubin: 1.2 mg/dL (ref 0.3–1.2)
Total Protein: 5.8 g/dL — ABNORMAL LOW (ref 6.5–8.1)

## 2020-11-06 LAB — PROTIME-INR
INR: 1.4 — ABNORMAL HIGH (ref 0.8–1.2)
Prothrombin Time: 17.5 seconds — ABNORMAL HIGH (ref 11.4–15.2)

## 2020-11-06 MED ORDER — LACTULOSE 10 GM/15ML PO SOLN: 10.0000 g | Freq: Every day | ORAL | Status: AC

## 2020-11-06 NOTE — Discharge Summary (Signed)
Physician Discharge Summary   Cassidy [name]  female DOB: Nov 09, 1940  HCW:[id number]  PCP: Kirk Ruths, MD  Admit date: 11/05/2020 Discharge date: 11/06/2020  Admitted From: SNF Disposition:  SNF CODE STATUS: DNR   Hospital Course:  For full details, please see H&P, progress notes, consult notes and ancillary notes.  Briefly,  Cassidy [name] is a 80 y.o. female with medical history significant of hypothyroidism, arthritis, chronic pain, depression, anxiety, esophageal stenosis, cirrhosis secondary to [facility], thrombocytopenia, GERD, dementia who presents with ongoing diarrhea.  Diarrhea, resolved > Patient with 5 days of significant diarrhea and 5-10 episodes for the last 4 days. > take lactulose, however reportedly held at her facility for 2 days with no effect on her increased diarrhea. > C. difficile panel ordered in the ED, however, no more stool after presentation, so C diff was not collected. --Discussed with Dr. Marius Ditch, pt cleared for discharge. --reduce lactulose to 10g daily (down from BID)   GI bleeding, not currently active > reported stools were melanotic, however, no signs of GI bleed after presentation. --Hgb stable in 9's --Discussed with Dr. Marius Ditch, pt cleared for discharge.  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  MELD around 10 - Continue with spironolactone --cont rifaximin  --reduce lactulose to 10g daily (down from BID)   Sore throat likely 2/2 viral pharyngitis  > The past 2 days.  Strep negative in the ED.  No stuck sensation which was evaluated due to her history of esophageal stenosis.  Throat did appear erythematous - supportive care    Depression Anxiety - Continue home BuSpar, Lexapro, Seroquel, trazodone - Continue as needed Klonopin   Hypothyroidism - Continue home Synthroid  nonocclusive portal vein thrombosis  --after discussing with Dr. Marius Ditch, will not start  anticoagulation right now. --outpatient followup with Dr. Tasia Catchings.   Discharge Diagnoses:  Principal Problem:   GI bleed Active Problems:   GAD (generalized anxiety disorder)   Adult hypothyroidism   Depression, major, recurrent, in partial remission ([place])   Thrombocytopenia ([place])   Liver cirrhosis secondary to NASH ([place])   Dementia arising in the senium and presenium ([place])   Intractable diarrhea   Discharge Instructions:  Allergies as of 11/06/2020       Reactions   Sulfa Antibiotics Shortness Of Breath        Medication List     TAKE these medications    azelastine 0.1 % nasal spray Commonly known as: ASTELIN Place 1 spray into both nostrils 2 (two) times daily. Use in each nostril as directed   busPIRone 15 MG tablet Commonly known as: BUSPAR Take 15 mg by mouth 3 (three) times daily.   clonazePAM 0.25 MG disintegrating tablet Commonly known as: KLONOPIN Take 0.25 mg by mouth 2 (two) times daily.   escitalopram 20 MG tablet Commonly known as: LEXAPRO TAKE ONE TABLET BY MOUTH EVERY DAY   fexofenadine 180 MG tablet Commonly known as: ALLEGRA Take 180 mg by mouth daily.   fluticasone 50 MCG/ACT nasal spray Commonly known as: FLONASE Place 2 sprays into both nostrils daily.   lactulose 10 GM/15ML solution Commonly known as: CHRONULAC Take 15 mLs (10 g total) by mouth daily. Decrease from BID due to excessive diarrhea. What changed:  when to take this additional instructions   levothyroxine 50 MCG tablet Commonly known as: SYNTHROID Take 50 mcg by mouth every morning.   omeprazole 40 MG capsule Commonly known  as: PriLOSEC Take 1 capsule (40 mg total) by mouth 2 (two) times daily.   ondansetron 4 MG tablet Commonly known as: ZOFRAN Take 4 mg by mouth every 8 (eight) hours as needed.   QUEtiapine 100 MG tablet Commonly known as: SEROQUEL TAKE 1 TABLET BY MOUTH NIGHTLY   spironolactone 100 MG tablet Commonly known as: ALDACTONE Take 1 tablet (100  mg total) by mouth daily.   traZODone 100 MG tablet Commonly known as: DESYREL Take 100 mg by mouth at bedtime.   Xifaxan 550 MG Tabs tablet Generic drug: rifaximin Take 1 tablet by mouth 2 (two) times daily.         Follow-up Information     Earlie Server, MD Follow up in 2 week(s).   Specialty: Oncology Why: for non-occlusive portal vein thrombosis. Contact information: [place] [address] [phone]                 Allergies  Allergen Reactions   Sulfa Antibiotics Shortness Of Breath     The results of significant diagnostics from this hospitalization (including imaging, microbiology, ancillary and laboratory) are listed below for reference.   Consultations:   Procedures/Studies: US Abdomen Limited RUQ (LIVER/GB)  Result Date: 11/01/2020 CLINICAL DATA:  Cirrhosis EXAM: ULTRASOUND ABDOMEN LIMITED RIGHT UPPER QUADRANT COMPARISON:  03/13/2020 FINDINGS: Gallbladder: No gallstones or wall thickening visualized. No sonographic Murphy sign noted by sonographer. Common bile duct: Diameter: 6 mm. Liver: No focal lesion identified. Coarsened, heterogeneous hepatic echotexture with nodular surface contour compatible with known cirrhosis. Echogenic, nonocclusive thrombus within the portal vein occupying the majority of the lumen. Visualized thrombus measures approximately 2.9 x 1.9 x 1.5 cm. Preserved hepatopetal flow. Other: None. IMPRESSION: 1. Interval development of nonocclusive portal vein thrombosis. 2. Cirrhotic hepatic morphology.  No focal liver lesion identified. These results will be called to the ordering clinician or representative by the Radiologist Assistant, and communication documented in the PACS or [employer]. Electronically Signed   By: Davina Poke D.O.   On: 11/01/2020 10:21      Labs: BNP (last 3 results) No results for input(s): BNP in the last 8760 hours. Basic Metabolic Panel: Recent Labs  Lab 11/05/20 1719  11/06/20 0619  NA 135 138  K 4.2 4.3  CL 102 103  CO2 26 27  GLUCOSE 90 86  BUN 11 11  CREATININE 0.84 0.80  CALCIUM 9.1 8.7*  MG  --  1.7   Liver Function Tests: Recent Labs  Lab 11/05/20 1719 11/06/20 0619  AST 28 23  ALT 15 14  ALKPHOS 72 59  BILITOT 1.1 1.2  PROT 5.9* 5.8*  ALBUMIN 3.1* 3.0*   Recent Labs  Lab 11/05/20 1719  LIPASE 34   No results for input(s): AMMONIA in the last 168 hours. CBC: Recent Labs  Lab 11/05/20 1719 11/06/20 0619  WBC 2.2* 2.0*  HGB 9.4* 9.1*  HCT 28.7* 28.4*  MCV 80.8 81.1  PLT 68* 58*   Cardiac Enzymes: No results for input(s): CKTOTAL, CKMB, CKMBINDEX, TROPONINI in the last 168 hours. BNP: Invalid input(s): POCBNP CBG: No results for input(s): GLUCAP in the last 168 hours. D-Dimer No results for input(s): DDIMER in the last 72 hours. Hgb A1c No results for input(s): HGBA1C in the last 72 hours. Lipid Profile No results for input(s): CHOL, HDL, LDLCALC, TRIG, CHOLHDL, LDLDIRECT in the last 72 hours. Thyroid function studies No results for input(s): TSH, T4TOTAL, T3FREE, THYROIDAB in the last 72 hours.  Invalid  input(s): FREET3 Anemia work up No results for input(s): VITAMINB12, FOLATE, FERRITIN, TIBC, IRON, RETICCTPCT in the last 72 hours. Urinalysis    Component Value Date/Time   COLORURINE AMBER (A) 02/01/2018 1705   APPEARANCEUR CLEAR (A) 02/01/2018 1705   APPEARANCEUR Clear 08/21/2013 1035   LABSPEC 1.014 02/01/2018 1705   LABSPEC 1.021 08/21/2013 1035   PHURINE 6.0 02/01/2018 1705   GLUCOSEU NEGATIVE 02/01/2018 1705   GLUCOSEU Negative 08/21/2013 1035   HGBUR LARGE (A) 02/01/2018 1705   BILIRUBINUR NEGATIVE 02/01/2018 1705   BILIRUBINUR Negative 08/21/2013 1035   KETONESUR NEGATIVE 02/01/2018 1705   PROTEINUR NEGATIVE 02/01/2018 1705   NITRITE NEGATIVE 02/01/2018 1705   LEUKOCYTESUR NEGATIVE 02/01/2018 1705   LEUKOCYTESUR Negative 08/21/2013 1035   Sepsis Labs Invalid input(s): PROCALCITONIN,  WBC,   LACTICIDVEN Microbiology Recent Results (from the past 240 hour(s))  Resp Panel by RT-PCR (Flu A&B, Covid) Nasopharyngeal Swab     Status: None   Collection Time: 11/05/20  7:48 PM   Specimen: Nasopharyngeal Swab; Nasopharyngeal(NP) swabs in vial transport medium  Result Value Ref Range Status   SARS Coronavirus 2 by RT PCR NEGATIVE NEGATIVE Final    Comment: (NOTE) SARS-CoV-2 target nucleic acids are NOT DETECTED.  The SARS-CoV-2 RNA is generally detectable in upper respiratory specimens during the acute phase of infection. The lowest concentration of SARS-CoV-2 viral copies this assay can detect is 138 copies/mL. A negative result does not preclude SARS-Cov-2 infection and should not be used as the sole basis for treatment or other patient management decisions. A negative result may occur with  improper specimen collection/handling, submission of specimen other than nasopharyngeal swab, presence of viral mutation(s) within the areas targeted by this assay, and inadequate number of viral copies(<138 copies/mL). A negative result must be combined with clinical observations, patient history, and epidemiological information. The expected result is Negative.  Fact Sheet for Patients:  EntrepreneurPulse.com.au  Fact Sheet for Healthcare Providers:  IncredibleEmployment.be  This test is no t yet approved or cleared by the [place] FDA and  has been authorized for detection and/or diagnosis of SARS-CoV-2 by FDA under an Emergency Use Authorization (EUA). This EUA will remain  in effect (meaning this test can be used) for the duration of the COVID-19 declaration under Section 564(b)(1) of the Act, 21 U.S.C.section 360bbb-3(b)(1), unless the authorization is terminated  or revoked sooner.       Influenza A by PCR NEGATIVE NEGATIVE Final   Influenza B by PCR NEGATIVE NEGATIVE Final    Comment: (NOTE) The Xpert Xpress SARS-CoV-2/FLU/RSV plus  assay is intended as an aid in the diagnosis of influenza from Nasopharyngeal swab specimens and should not be used as a sole basis for treatment. Nasal washings and aspirates are unacceptable for Xpert Xpress SARS-CoV-2/FLU/RSV testing.  Fact Sheet for Patients: EntrepreneurPulse.com.au  Fact Sheet for Healthcare Providers: IncredibleEmployment.be  This test is not yet approved or cleared by the [place] FDA and has been authorized for detection and/or diagnosis of SARS-CoV-2 by FDA under an Emergency Use Authorization (EUA). This EUA will remain in effect (meaning this test can be used) for the duration of the COVID-19 declaration under Section 564(b)(1) of the Act, 21 U.S.C. section 360bbb-3(b)(1), unless the authorization is terminated or revoked.  Performed at [facility], [place], [place] [address]   Group A Strep by PCR [facility] Only)     Status: None   Collection Time: 11/05/20  7:48 PM   Specimen: Throat; Sterile Swab  Result Value Ref Range Status   Group A Strep by PCR NOT DETECTED NOT DETECTED Final    Comment: Performed at [facility], [address] [place], [place] [address]     Total time spend on discharging this patient, including the last patient exam, discussing the hospital stay, instructions for ongoing care as it relates to all pertinent caregivers, as well as preparing the medical discharge records, prescriptions, and/or referrals as applicable, is 50 minutes.    Enzo Bi, MD  [facility] 11/06/2020, 10:15 AM

## 2020-11-06 NOTE — ED Notes (Signed)
[facility]  CALLED  FOR  TRANSPORT  TO  [facility]

## 2020-11-21 ENCOUNTER — Encounter: Payer: Self-pay | Admitting: Oncology

## 2020-11-21 ENCOUNTER — Inpatient Hospital Stay: Payer: Medicare Other | Attending: Oncology | Admitting: Oncology

## 2020-11-21 ENCOUNTER — Telehealth: Payer: Self-pay

## 2020-11-21 ENCOUNTER — Inpatient Hospital Stay: Payer: Medicare Other

## 2020-11-21 ENCOUNTER — Other Ambulatory Visit: Payer: Self-pay

## 2020-11-21 VITALS — BP 114/47 | HR 73 | Temp 98.5°F | Resp 18 | Wt 151.9 lb

## 2020-11-21 DIAGNOSIS — I81 Portal vein thrombosis: Secondary | ICD-10-CM

## 2020-11-21 DIAGNOSIS — R270 Ataxia, unspecified: Secondary | ICD-10-CM | POA: Diagnosis not present

## 2020-11-21 DIAGNOSIS — Z79899 Other long term (current) drug therapy: Secondary | ICD-10-CM | POA: Insufficient documentation

## 2020-11-21 DIAGNOSIS — R197 Diarrhea, unspecified: Secondary | ICD-10-CM | POA: Diagnosis not present

## 2020-11-21 DIAGNOSIS — K7581 Nonalcoholic steatohepatitis (NASH): Secondary | ICD-10-CM

## 2020-11-21 DIAGNOSIS — R5383 Other fatigue: Secondary | ICD-10-CM | POA: Diagnosis not present

## 2020-11-21 DIAGNOSIS — D696 Thrombocytopenia, unspecified: Secondary | ICD-10-CM

## 2020-11-21 DIAGNOSIS — F0280 Dementia in other diseases classified elsewhere without behavioral disturbance: Secondary | ICD-10-CM | POA: Insufficient documentation

## 2020-11-21 DIAGNOSIS — K921 Melena: Secondary | ICD-10-CM | POA: Insufficient documentation

## 2020-11-21 DIAGNOSIS — R531 Weakness: Secondary | ICD-10-CM

## 2020-11-21 DIAGNOSIS — K7469 Other cirrhosis of liver: Secondary | ICD-10-CM | POA: Diagnosis not present

## 2020-11-21 DIAGNOSIS — K7460 Unspecified cirrhosis of liver: Secondary | ICD-10-CM

## 2020-11-21 DIAGNOSIS — D500 Iron deficiency anemia secondary to blood loss (chronic): Secondary | ICD-10-CM

## 2020-11-21 DIAGNOSIS — E039 Hypothyroidism, unspecified: Secondary | ICD-10-CM | POA: Insufficient documentation

## 2020-11-21 DIAGNOSIS — K219 Gastro-esophageal reflux disease without esophagitis: Secondary | ICD-10-CM | POA: Insufficient documentation

## 2020-11-21 DIAGNOSIS — I8290 Acute embolism and thrombosis of unspecified vein: Secondary | ICD-10-CM

## 2020-11-21 LAB — CBC WITH DIFFERENTIAL/PLATELET
Abs Immature Granulocytes: 0.03 10*3/uL (ref 0.00–0.07)
Basophils Absolute: 0 10*3/uL (ref 0.0–0.1)
Basophils Relative: 1 %
Eosinophils Absolute: 0.1 10*3/uL (ref 0.0–0.5)
Eosinophils Relative: 3 %
HCT: 31 % — ABNORMAL LOW (ref 36.0–46.0)
Hemoglobin: 9.5 g/dL — ABNORMAL LOW (ref 12.0–15.0)
Immature Granulocytes: 1 %
Lymphocytes Relative: 12 %
Lymphs Abs: 0.4 10*3/uL — ABNORMAL LOW (ref 0.7–4.0)
MCH: 25.3 pg — ABNORMAL LOW (ref 26.0–34.0)
MCHC: 30.6 g/dL (ref 30.0–36.0)
MCV: 82.4 fL (ref 80.0–100.0)
Monocytes Absolute: 0.2 10*3/uL (ref 0.1–1.0)
Monocytes Relative: 7 %
Neutro Abs: 2.5 10*3/uL (ref 1.7–7.7)
Neutrophils Relative %: 76 %
Platelets: 85 10*3/uL — ABNORMAL LOW (ref 150–400)
RBC: 3.76 MIL/uL — ABNORMAL LOW (ref 3.87–5.11)
RDW: 18.1 % — ABNORMAL HIGH (ref 11.5–15.5)
WBC: 3.3 10*3/uL — ABNORMAL LOW (ref 4.0–10.5)
nRBC: 0 % (ref 0.0–0.2)

## 2020-11-21 LAB — TECHNOLOGIST SMEAR REVIEW: Plt Morphology: DECREASED

## 2020-11-21 LAB — RETIC PANEL
Immature Retic Fract: 15 % (ref 2.3–15.9)
RBC.: 3.73 MIL/uL — ABNORMAL LOW (ref 3.87–5.11)
Retic Count, Absolute: 56 10*3/uL (ref 19.0–186.0)
Retic Ct Pct: 1.5 % (ref 0.4–3.1)
Reticulocyte Hemoglobin: 26.1 pg — ABNORMAL LOW

## 2020-11-21 LAB — IRON AND TIBC
Iron: 27 ug/dL — ABNORMAL LOW (ref 28–170)
Saturation Ratios: 7 % — ABNORMAL LOW (ref 10.4–31.8)
TIBC: 365 ug/dL (ref 250–450)
UIBC: 338 ug/dL

## 2020-11-21 LAB — VITAMIN B12: Vitamin B-12: 848 pg/mL (ref 180–914)

## 2020-11-21 LAB — IMMATURE PLATELET FRACTION: Immature Platelet Fraction: 2 % (ref 1.2–8.6)

## 2020-11-21 LAB — FOLATE: Folate: 16.8 ng/mL

## 2020-11-21 LAB — FERRITIN: Ferritin: 18 ng/mL (ref 11–307)

## 2020-11-21 NOTE — Progress Notes (Signed)
Hematology/Oncology Progress Note [facility] Telephone:([phone] [phone]  Patient Care Team: Kirk Ruths, MD as PCP - General (Internal Medicine)   Name of the patient: Cassidy [name]  [id number]  09-14-1940  Date of visit: 11/21/20   INTERVAL HISTORY-  Is seen for posthospitalization follow-up and evaluation of nonocclusive portal vein thrombus. Patient was last seen by me in 2019 and then she lost follow-up.  Patient was accompanied by sister Cassidy [name] who is patient's power of attorney. Patient has history of [name] cirrhosis, splenomegaly and chronic thrombocytopenia. 10/30/2020, patient had ultrasound abdomen right upper quadrant for [place] surveillance. No focal liver lesion was identified.  Interval development of nonocclusive portal vein thrombosis.  11/05/2020 -11/06/2020 she was recently admitted due to diarrhea.  C. difficile was not obtained as no additional loose stool after presentation.  There is report of melena, no signs of active bleeding during the presentation.  Hemoglobin was stable in the 9s  Patient was recommended to establish care with me today for further evaluation.  Patient has dementia and is a poor historian.  My history was provided by [name].  Review of systems- Review of Systems  Unable to perform ROS: Dementia  Constitutional:  Positive for malaise/fatigue.  HENT:  Negative for sore throat.   Gastrointestinal:  Negative for abdominal pain and blood in stool.       Dark stool  Endo/Heme/Allergies:  Bruises/bleeds easily.   Allergies  Allergen Reactions   Sulfa Antibiotics Shortness Of Breath    Patient Active Problem List   Diagnosis Date Noted   GI bleed 11/05/2020   Intractable diarrhea 11/05/2020   Resides in skilled nursing facility 08/24/2020   Acute hypoxemic respiratory failure ([place]) 12/15/2019   Esophageal obstruction due to food impaction    Stomach irritation    Foreign body in esophagus     MDD (major depressive disorder), recurrent, in full remission ([place]) 09/08/2019   Insomnia due to mental disorder 09/08/2019   Major neurocognitive disorder due to multiple etiologies ([place]) 09/08/2019   Uses walker 11/24/2018   Dementia arising in the senium and presenium ([place]) 11/24/2018   General weakness 05/08/2018   Edema of upper extremity 05/08/2018   Liver cirrhosis secondary to NASH ([place]) 03/08/2018   Pleural effusion on right 02/13/2018   Protein-calorie malnutrition, severe 02/04/2018   Weight loss 01/19/2017   Severe recurrent major depression without psychotic features ([place]) 06/19/2016   Hepatic encephalopathy ([place]) 08/13/2015   Thrombocytopenia ([place]) 07/23/2015   Amnesia 03/20/2015   Major depression in remission ([place]) 03/12/2015   Gonalgia 01/05/2015   Arthritis 11/27/2014   Depression, major, recurrent, in partial remission ([place]) 11/27/2014   Encounter for general adult medical examination without abnormal findings 08/25/2014   Chronic LBP 08/15/2014   Complete rotator cuff rupture of left shoulder 05/01/2014   Infraspinatus tenosynovitis 05/01/2014   Other synovitis and tenosynovitis, right shoulder 05/01/2014   Difficulty in walking 11/30/2013   Arthritis, degenerative 10/01/2013   Steatohepatitis 10/01/2013   Orthostasis 10/01/2013   Appendicular ataxia 09/26/2013   Fall 09/26/2013   Osteoporosis with fracture 09/06/2013   Clinical depression 03/25/2012   Adult hypothyroidism 03/25/2012   Esophageal stenosis 07/03/2009   Back ache 03/08/2003   GAD (generalized anxiety disorder) 12/27/2002     Past Medical History:  Diagnosis Date   Anxiety    Depression    Fatty liver    GERD (gastroesophageal reflux disease)    Hypothyroidism    Obesity    Thyroid  disease      Past Surgical History:  Procedure Laterality Date   APPENDECTOMY     DILATATION & CURETTAGE/HYSTEROSCOPY WITH MYOSURE N/A 02/04/2018   Procedure: [place];  Surgeon: Ward, Honor Loh, MD;  Location: [facility] ORS;  Service: Gynecology;  Laterality: N/A;   ESOPHAGOGASTRODUODENOSCOPY N/A 12/15/2019   Procedure: ESOPHAGOGASTRODUODENOSCOPY (EGD);  Surgeon: Virgel Manifold, MD;  [facility] ENDOSCOPY;  Service: Endoscopy;  Laterality: N/A;   ESOPHAGOGASTRODUODENOSCOPY (EGD) WITH PROPOFOL N/A 12/20/2014   Procedure: ESOPHAGOGASTRODUODENOSCOPY (EGD) WITH PROPOFOL;  Surgeon: Manya Silvas, MD;  Location[facility] ENDOSCOPY;  Service: Endoscopy;  Laterality: N/A;   SAVORY DILATION N/A 12/20/2014   Procedure: SAVORY DILATION;  Surgeon: Manya Silvas, MD;  Location: [facility] ENDOSCOPY;  Service: Endoscopy;  Laterality: N/A;   TONSILLECTOMY      Social History   Socioeconomic History   Marital status: Widowed    Spouse name: Not on file   Number of children: 3   Years of education: Not on file   Highest education level: Not on file  Occupational History   Not on file  Tobacco Use   Smoking status: Never   Smokeless tobacco: Never  Vaping Use   Vaping Use: Never used  Substance and Sexual Activity   Alcohol use: No   Drug use: No   Sexual activity: Not Currently    Birth control/protection: Post-menopausal  Other Topics Concern   Not on file  Social History Narrative   Not on file   Social Determinants of Health   Financial Resource Strain: Not on file  Food Insecurity: Not on file  Transportation Needs: Not on file  Physical Activity: Not on file  Stress: Not on file  Social Connections: Not on file  Intimate Partner Violence: Not on file     Family History  Problem Relation Age of Onset   Diabetes Mother    COPD Mother    Kidney disease Mother    Depression Mother    Hypertension Mother    Heart attack Father    Aneurysm Father    Anxiety disorder Sister    Depression Sister    Diabetes Sister    Hypertension Sister    Atrial fibrillation Brother    Spinal muscular atrophy Brother    Breast cancer Sister    Bone  cancer Sister    Depression Sister    Anxiety disorder Sister    Liver cancer Sister    Breast cancer Sister    Colon cancer Sister    Depression Sister    Diabetes Sister    COPD Sister    Depression Sister    Anxiety disorder Sister    Skin cancer Sister    Diabetes Brother    Depression Brother    Skin cancer Maternal Aunt    Diabetes Maternal Aunt    Cervical cancer Paternal Aunt      Current Outpatient Medications:    azelastine (ASTELIN) 0.1 % nasal spray, Place 1 spray into both nostrils 2 (two) times daily. Use in each nostril as directed, Disp: , Rfl:    busPIRone (BUSPAR) 15 MG tablet, Take 15 mg by mouth 3 (three) times daily., Disp: , Rfl:    escitalopram (LEXAPRO) 20 MG tablet, TAKE ONE TABLET BY MOUTH EVERY DAY, Disp: 90 tablet, Rfl: 1   fexofenadine (ALLEGRA) 180 MG tablet, Take 180 mg by mouth daily., Disp: , Rfl:    fluticasone (FLONASE) 50 MCG/ACT nasal spray, Place 2 sprays  into both nostrils daily., Disp: , Rfl:    lactulose (CHRONULAC) 10 GM/15ML solution, Take 15 mLs (10 g total) by mouth daily. Decrease from BID due to excessive diarrhea., Disp: , Rfl:    levothyroxine (SYNTHROID) 50 MCG tablet, Take 50 mcg by mouth every morning., Disp: , Rfl:    ondansetron (ZOFRAN) 4 MG tablet, Take 4 mg by mouth every 8 (eight) hours as needed., Disp: , Rfl:    QUEtiapine (SEROQUEL) 100 MG tablet, TAKE 1 TABLET BY MOUTH NIGHTLY, Disp: 90 tablet, Rfl: 1   spironolactone (ALDACTONE) 100 MG tablet, Take 1 tablet (100 mg total) by mouth daily., Disp: 30 tablet, Rfl: 0   traZODone (DESYREL) 100 MG tablet, Take 100 mg by mouth at bedtime., Disp: , Rfl:    XIFAXAN 550 MG TABS tablet, Take 1 tablet by mouth 2 (two) times daily., Disp: , Rfl:    clonazePAM (KLONOPIN) 0.25 MG disintegrating tablet, Take 0.25 mg by mouth 2 (two) times daily. (Patient not taking: Reported on 11/21/2020), Disp: , Rfl:    omeprazole (PRILOSEC) 40 MG capsule, Take 1 capsule (40 mg total) by mouth 2 (two)  times daily., Disp: 90 capsule, Rfl: 0   Physical exam:  Vitals:   11/21/20 1052  BP: (!) 114/47  Pulse: 73  Resp: 18  Temp: 98.5 F (36.9 C)  TempSrc: Tympanic  SpO2: 96%  Weight: 151 lb 14.4 oz (68.9 kg)   Physical Exam Constitutional:      General: She is not in acute distress.    Appearance: She is not diaphoretic.     Comments: Frail elderly female sits in the wheelchair  HENT:     Head: Normocephalic and atraumatic.     Nose: Nose normal.     Mouth/Throat:     Pharynx: No oropharyngeal exudate.  Eyes:     General: No scleral icterus.    Pupils: Pupils are equal, round, and reactive to light.  Cardiovascular:     Rate and Rhythm: Normal rate.     Heart sounds: No murmur heard. Pulmonary:     Effort: Pulmonary effort is normal.     Breath sounds: Normal breath sounds.  Abdominal:     General: There is no distension.     Palpations: Abdomen is soft.     Tenderness: There is no abdominal tenderness.  Musculoskeletal:     Cervical back: Normal range of motion and neck supple.  Skin:    General: Skin is warm and dry.  Neurological:     Mental Status: She is alert. Mental status is at baseline.     Motor: No abnormal muscle tone.     Comments: Patient answers simple yes and no questions.  Psychiatric:        Mood and Affect: Affect normal.       CMP Latest Ref Rng & Units 11/06/2020  Glucose 70 - 99 mg/dL 86  BUN 8 - 23 mg/dL 11  Creatinine 0.44 - 1.00 mg/dL 0.80  Sodium 135 - 145 mmol/L 138  Potassium 3.5 - 5.1 mmol/L 4.3  Chloride 98 - 111 mmol/L 103  CO2 22 - 32 mmol/L 27  Calcium 8.9 - 10.3 mg/dL 8.7(L)  Total Protein 6.5 - 8.1 g/dL 5.8(L)  Total Bilirubin 0.3 - 1.2 mg/dL 1.2  Alkaline Phos 38 - 126 U/L 59  AST 15 - 41 U/L 23  ALT 0 - 44 U/L 14   CBC Latest Ref Rng & Units 11/21/2020  WBC 4.0 - 10.5 K/uL  3.3(L)  Hemoglobin 12.0 - 15.0 g/dL 9.5(L)  Hematocrit 36.0 - 46.0 % 31.0(L)  Platelets 150 - 400 K/uL 85(L)    US Abdomen Limited RUQ  (LIVER/GB)  Result Date: 11/01/2020 CLINICAL DATA:  Cirrhosis EXAM: ULTRASOUND ABDOMEN LIMITED RIGHT UPPER QUADRANT COMPARISON:  03/13/2020 FINDINGS: Gallbladder: No gallstones or wall thickening visualized. No sonographic Murphy sign noted by sonographer. Common bile duct: Diameter: 6 mm. Liver: No focal lesion identified. Coarsened, heterogeneous hepatic echotexture with nodular surface contour compatible with known cirrhosis. Echogenic, nonocclusive thrombus within the portal vein occupying the majority of the lumen. Visualized thrombus measures approximately 2.9 x 1.9 x 1.5 cm. Preserved hepatopetal flow. Other: None. IMPRESSION: 1. Interval development of nonocclusive portal vein thrombosis. 2. Cirrhotic hepatic morphology.  No focal liver lesion identified. These results will be called to the ordering clinician or representative by the Radiologist Assistant, and communication documented in the PACS or [employer]. Electronically Signed   By: Davina Poke D.O.   On: 11/01/2020 10:21     Assessment and plan-  1. Portal vein thrombosis   2. Thrombocytopenia ([place])   3. Iron deficiency anemia due to chronic blood loss   4. Liver cirrhosis secondary to NASH [facility])    #Thrombocytopenia is likely secondary to NASH cirrhosis with splenomegaly Patient has had work-up done in the past including  normal B12 and folate level, negative hepatitis panel, HIV was done in March 2019 was negative.  Negative ANA.  Normal LDH.    #Portal vein thrombosis, She has thrombocytopenia, melena, unknown status of varices, frailty due to multiple medical problems, she is at high risk of bleeding and not a candidate for anticoagulation.  I recommend observation.  #Fatigue and weakness, I suspect patient may have underlying iron deficiency anemia.  Hemoglobin was 9.1 at discharge. Check CBC, iron TIBC ferritin, B12, folate.  -Iron panel confirms iron deficiency.  Recommend patient to take ferrous sulfate 325 mg  once daily, if tolerating, she may increase to twice daily.  #NASH cirrhosis, follow-up with gastroenterology..  Thank you for allowing me to participate in the care of this patient.   Earlie Server, MD, PhD 11/21/2020

## 2020-11-21 NOTE — Telephone Encounter (Signed)
1010 am.  Phone call made to [facility] to follow up on patient status.  Patient is currently a resident at [facility] and is expected to remain there long term.  [facility] does not have a contract with this facility and sister is advised that Palliative Care will need to discharge patient from services.  Guy Franco to follow up with the facility as they may have hospice and palliative care services.  [name] stated they were currently on the way to an oncology appt and she will follow up on additional services for patient at the facility.

## 2020-11-22 ENCOUNTER — Other Ambulatory Visit: Payer: Self-pay

## 2020-11-22 ENCOUNTER — Telehealth: Payer: Self-pay

## 2020-11-22 DIAGNOSIS — D500 Iron deficiency anemia secondary to blood loss (chronic): Secondary | ICD-10-CM

## 2020-11-22 DIAGNOSIS — D696 Thrombocytopenia, unspecified: Secondary | ICD-10-CM

## 2020-11-22 NOTE — Telephone Encounter (Signed)
Spoke to [employer] to notify her of MD recommendations: Lab shows iron deficiency. Please take oral iron supplementation 1-2 times perday as we discussed.

## 2020-11-22 NOTE — Telephone Encounter (Signed)
Appts have been scheduled.

## 2020-11-22 NOTE — Telephone Encounter (Signed)
Contacted pt's POA and relayed MD recommendations. Also contacted [facility] facility and spoke to nurse Chystal. Informed her that MD would like pt to be started on oral iron supplement 2 times per day. Nurse states that NP Enzo Montgomery reviewed Dr. Collie Siad progress note and started patient on ferrous sulfate 325 every other day. Is she ok to take every other day or should it be increased to daily? BID?   Appt information given.

## 2020-11-22 NOTE — Telephone Encounter (Signed)
-----   Message from Earlie Server, MD sent at 11/21/2020 11:37 PM EDT ----- Follow up plan  Lab in 3 months prior to NP [ cbc,iron tibc ferritin,] Lab in 6 months prior to MD [ cbc,CMP iron tibc ferritin,]

## 2020-11-22 NOTE — Telephone Encounter (Signed)
Per Dr. Tasia Catchings: iron panel came back low, recommend BID. Contacted [facility] to notify her of this and she will inform NP at facility.

## 2021-01-10 ENCOUNTER — Emergency Department: Payer: Medicare Other

## 2021-01-10 ENCOUNTER — Other Ambulatory Visit: Payer: Self-pay

## 2021-01-10 ENCOUNTER — Emergency Department
Admission: EM | Admit: 2021-01-10 | Discharge: 2021-01-10 | Disposition: A | Discharge status: Home / Self Care | Payer: Medicare Other | Attending: Emergency Medicine | Admitting: Emergency Medicine

## 2021-01-10 DIAGNOSIS — R911 Solitary pulmonary nodule: Secondary | ICD-10-CM | POA: Diagnosis not present

## 2021-01-10 DIAGNOSIS — F0390 Unspecified dementia without behavioral disturbance: Secondary | ICD-10-CM | POA: Diagnosis not present

## 2021-01-10 DIAGNOSIS — Z79899 Other long term (current) drug therapy: Secondary | ICD-10-CM | POA: Insufficient documentation

## 2021-01-10 DIAGNOSIS — E039 Hypothyroidism, unspecified: Secondary | ICD-10-CM | POA: Insufficient documentation

## 2021-01-10 DIAGNOSIS — Z7952 Long term (current) use of systemic steroids: Secondary | ICD-10-CM | POA: Diagnosis not present

## 2021-01-10 DIAGNOSIS — R1310 Dysphagia, unspecified: Secondary | ICD-10-CM

## 2021-01-10 LAB — COMPREHENSIVE METABOLIC PANEL
ALT: 19 U/L (ref 0–44)
AST: 30 U/L (ref 15–41)
Albumin: 3.4 g/dL — ABNORMAL LOW (ref 3.5–5.0)
Alkaline Phosphatase: 85 U/L (ref 38–126)
Anion gap: 6 (ref 5–15)
BUN: 13 mg/dL (ref 8–23)
CO2: 25 mmol/L (ref 22–32)
Calcium: 8.8 mg/dL — ABNORMAL LOW (ref 8.9–10.3)
Chloride: 107 mmol/L (ref 98–111)
Creatinine, Ser: 0.88 mg/dL (ref 0.44–1.00)
GFR, Estimated: >60 mL/min (ref >60)
Glucose, Bld: 90 mg/dL (ref 70–99)
Potassium: 3.9 mmol/L (ref 3.5–5.1)
Sodium: 138 mmol/L (ref 135–145)
Total Bilirubin: 1.8 mg/dL — ABNORMAL HIGH (ref 0.3–1.2)
Total Protein: 6.6 g/dL (ref 6.5–8.1)

## 2021-01-10 LAB — CBC
HCT: 39.1 % (ref 36.0–46.0)
Hemoglobin: 12.8 g/dL (ref 12.0–15.0)
MCH: 30 pg (ref 26.0–34.0)
MCHC: 32.7 g/dL (ref 30.0–36.0)
MCV: 91.8 fL (ref 80.0–100.0)
Platelets: 68 10*3/uL — ABNORMAL LOW (ref 150–400)
RBC: 4.26 MIL/uL (ref 3.87–5.11)
RDW: 20 % — ABNORMAL HIGH (ref 11.5–15.5)
WBC: 4.2 10*3/uL (ref 4.0–10.5)
nRBC: 0 % (ref 0.0–0.2)

## 2021-01-10 LAB — LIPASE, BLOOD: Lipase: 44 U/L (ref 11–51)

## 2021-01-10 MED ORDER — LIDOCAINE VISCOUS HCL 2 % MT SOLN: 15.0000 mL | Freq: Once | OROMUCOSAL | Status: DC

## 2021-01-10 MED ORDER — LACTATED RINGERS IV BOLUS
1000.0000 mL | Freq: Once | INTRAVENOUS | Status: AC
  Administered 2021-01-10: 1000 mL via INTRAVENOUS

## 2021-01-10 MED ORDER — ONDANSETRON HCL 4 MG/2ML IJ SOLN
4.0000 mg | Freq: Once | INTRAMUSCULAR | Status: AC
  Administered 2021-01-10: 4 mg via INTRAVENOUS
  Filled 2021-01-10: qty 2

## 2021-01-10 MED ORDER — ALUM & MAG HYDROXIDE-SIMETH 200-200-20 MG/5ML PO SUSP: 30.0000 mL | Freq: Once | ORAL | Status: DC

## 2021-01-10 MED ORDER — GLUCAGON HCL RDNA (DIAGNOSTIC) 1 MG IJ SOLR
1.0000 mg | Freq: Once | INTRAMUSCULAR | Status: AC
  Administered 2021-01-10: 1 mg via INTRAVENOUS
  Filled 2021-01-10: qty 1

## 2021-01-10 NOTE — ED Notes (Signed)
Called [facility] to transport patient back to [facility] @ 9:15pm

## 2021-01-10 NOTE — ED Provider Notes (Signed)
Emergency Medicine Provider Triage Evaluation Note  Cassidy [name] , a 80 y.o. female  was evaluated in triage.  Pt complains of inability to swallow.  Patient has a history of esophageal stricture and states that over the past few days every time she eats or drinks anything she immediately throws up.  She states that her throat feels tight.  She does have a history of GERD and takes Prilosec for same.  She is had dilations in the past and states that she thinks that she will need another dilation as she cannot keep anything down for the past 48 hours..  Review of Systems  Positive: Slight sore throat and difficulty swallowing Negative: Chest pain, shortness of breath, fevers or chills, other URI symptoms  Physical Exam  BP 139/70   Pulse 83   Temp 98.2 F (36.8 C) (Oral)   Resp 19   LMP  (LMP Unknown)   SpO2 94%  Gen:   Awake, no distress   Resp:  Normal effort  MSK:   Moves extremities without difficulty  Other:  No oropharyngeal erythema or edema noted.  Uvula is midline.  Patient is managing her own secretions.  No erythema or edema of the anterior neck.  No tenderness over the anterior neck.  Medical Decision Making  Medically screening exam initiated at 3:02 PM.  Appropriate orders placed.  Cassidy [name] was informed that the remainder of the evaluation will be completed by another provider, this initial triage assessment does not replace that evaluation, and the importance of remaining in the ED until their evaluation is complete.  Patient presents with inability to swallow x48 hours.  She has a history of esophageal stricture and has had dilations in the past.  Patient states that the symptoms are consistent with her previous times needing dilation.  She states that she cannot swallow solids or liquids without gagging and immediately throwing that material back up.  This is been ongoing x48 hours.  Will obtain basic labs, see if the patient can tolerate a GI cocktail.   Cassidy [name] 01/10/21 1505    Cassidy Divine, MD 01/10/21 1743

## 2021-01-10 NOTE — ED Notes (Signed)
Pt able to keep sprite down and not vomit;

## 2021-01-10 NOTE — ED Triage Notes (Signed)
Pt comes into the ED via EMS from [facility], since yesterday every time she eats or drinks , has a hx of esophageal stricture. A/ox4  144/61 104HR 93%RA

## 2021-01-10 NOTE — Discharge Instructions (Addendum)
   Your CXR today showed: 1. Chronic volume loss in the right hemithorax with smooth right apical opacity, likely chronic loculated pleural effusion based on prior CT. 2. Right basilar opacity and blunting of the costophrenic angle consistent with pleural effusion and atelectasis. Rounded density in the right lower lung zone was not seen on prior imaging. This may represent loculated fluid in the fissure, however recommend chest CT to exclude the possibility of pulmonary nodule. Recommend IV contrast in the absence of contraindications.

## 2021-01-10 NOTE — ED Provider Notes (Signed)
[facility] Emergency Department Provider Note  ____________________________________________   Event Date/Time   First MD Initiated Contact with Patient 01/10/21 1829     (approximate)  I have reviewed the triage vital signs and the nursing notes.   HISTORY  Chief Complaint No chief complaint on file.   HPI Cassidy [name] is a 80 y.o. female with a past medical history of anxiety, depression, GERD, appetite is not obesity and fatty liver disease as well as previous episodes of esophageal food boluses requiring endoscopy to remove and some mild dementia presents coming by her sister from nursing facility with inability to swallow anything since last night when she was eating some meat.  Patient has been attempting to drink fluids but he has been spitting up everything.  She endorses a little pressure in her throat and upper chest but denies any other acute pain, headache or earache, sore throat diarrhea abdominal pain or other sick symptoms.  Sister denies any other acute concerns at this time.         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   GI bleed 11/05/2020   Intractable diarrhea 11/05/2020   Resides in skilled nursing facility 08/24/2020   Acute hypoxemic respiratory failure ([place]) 12/15/2019   Esophageal obstruction due to food impaction    Stomach irritation    Foreign body in esophagus    MDD (major depressive disorder), recurrent, in full remission ([place]) 09/08/2019   Insomnia due to mental disorder 09/08/2019   Major neurocognitive disorder due to multiple etiologies ([place]) 09/08/2019   Uses walker 11/24/2018   Dementia arising in the senium and presenium ([place]) 11/24/2018   General weakness 05/08/2018   Edema of upper  extremity 05/08/2018   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   Pleural effusion on right 02/13/2018   Protein-calorie malnutrition, severe 02/04/2018   Weight loss 01/19/2017   Severe recurrent major depression without psychotic features ([place]) 06/19/2016   Hepatic encephalopathy ([place]) 08/13/2015   Thrombocytopenia ([place]) 07/23/2015   Amnesia 03/20/2015   Major depression in remission ([place]) 03/12/2015   Gonalgia 01/05/2015   Arthritis 11/27/2014   Depression, major, recurrent, in partial remission ([place]) 11/27/2014   Encounter for general adult medical examination without abnormal findings 08/25/2014   Chronic LBP 08/15/2014   Complete rotator cuff rupture of left shoulder 05/01/2014   Infraspinatus tenosynovitis 05/01/2014   Other synovitis and tenosynovitis, right shoulder 05/01/2014   Difficulty in walking 11/30/2013   Arthritis, degenerative 10/01/2013   Steatohepatitis 10/01/2013   Orthostasis 10/01/2013   Appendicular ataxia 09/26/2013   Fall 09/26/2013   Osteoporosis with fracture 09/06/2013   Clinical depression 03/25/2012   Adult hypothyroidism 03/25/2012   Esophageal stenosis 07/03/2009   Back ache 03/08/2003   GAD (generalized anxiety disorder) 12/27/2002    Past Surgical History:  Procedure Laterality Date   APPENDECTOMY     DILATATION & CURETTAGE/HYSTEROSCOPY WITH MYOSURE N/A 02/04/2018   Procedure: DILATATION & CURETTAGE/HYSTEROSCOPY WITH MYOSURE;  Surgeon: Ward, Honor Loh, MD;  Location: [facility] ORS;  Service: Gynecology;  Laterality: N/A;   ESOPHAGOGASTRODUODENOSCOPY N/A 12/15/2019   Procedure: ESOPHAGOGASTRODUODENOSCOPY (EGD);  Surgeon: Virgel Manifold, MD;  Location: [facility] ENDOSCOPY;  Service: Endoscopy;  Laterality: N/A;   ESOPHAGOGASTRODUODENOSCOPY (EGD) WITH PROPOFOL N/A 12/20/2014   Procedure: ESOPHAGOGASTRODUODENOSCOPY (EGD) WITH PROPOFOL;  Surgeon: Manya Silvas, MD;  Location: [facility] ENDOSCOPY;  Service: Endoscopy;  Laterality: N/A;   SAVORY  DILATION N/A 12/20/2014   Procedure: SAVORY DILATION;  Surgeon: Manya Silvas, MD;  Location: [facility] ENDOSCOPY;  Service: Endoscopy;  Laterality: N/A;   TONSILLECTOMY      Prior to Admission medications   Medication Sig Start Date End Date Taking? Authorizing Provider  azelastine (ASTELIN) 0.1 % nasal spray Place 1 spray into both nostrils 2 (two) times daily. Use in each nostril as directed    [provider]  busPIRone (BUSPAR) 15 MG tablet Take 15 mg by mouth 3 (three) times daily. 09/14/20   [provider]  clonazePAM (KLONOPIN) 0.25 MG disintegrating tablet Take 0.25 mg by mouth 2 (two) times daily. Patient not taking: Reported on 11/21/2020 08/24/20   [provider]  escitalopram (LEXAPRO) 20 MG tablet TAKE ONE TABLET BY MOUTH EVERY DAY 07/26/20   Ursula Alert, MD  fexofenadine (ALLEGRA) 180 MG tablet Take 180 mg by mouth daily.    [provider]  fluticasone (FLONASE) 50 MCG/ACT nasal spray Place 2 sprays into both nostrils daily. 05/18/20   [provider]  lactulose (CHRONULAC) 10 GM/15ML solution Take 15 mLs (10 g total) by mouth daily. Decrease from BID due to excessive diarrhea. 11/06/20   Enzo Bi, MD  levothyroxine (SYNTHROID) 50 MCG tablet Take 50 mcg by mouth every morning. 05/24/19   [provider]  omeprazole (PRILOSEC) 40 MG capsule Take 1 capsule (40 mg total) by mouth 2 (two) times daily. 12/15/19 11/05/20  Virgel Manifold, MD  ondansetron (ZOFRAN) 4 MG tablet Take 4 mg by mouth every 8 (eight) hours as needed. 09/05/20   [provider]  QUEtiapine (SEROQUEL) 100 MG tablet TAKE 1 TABLET BY MOUTH NIGHTLY 05/18/20   Ursula Alert, MD  spironolactone (ALDACTONE) 100 MG tablet Take 1 tablet (100 mg total) by mouth daily. 02/19/18   Mayo, Pete Pelt, MD  traZODone (DESYREL) 100 MG tablet Take 100 mg by mouth at bedtime. 10/05/20   [provider]  XIFAXAN 550 MG TABS tablet Take 1 tablet by mouth 2 (two) times  daily. 10/21/17   [provider]    Allergies Sulfa antibiotics  Family History  Problem Relation Age of Onset   Diabetes Mother    COPD Mother    Kidney disease Mother    Depression Mother    Hypertension Mother    Heart attack Father    Aneurysm Father    Anxiety disorder Sister    Depression Sister    Diabetes Sister    Hypertension Sister    Atrial fibrillation Brother    Spinal muscular atrophy Brother    Breast cancer Sister    Bone cancer Sister    Depression Sister    Anxiety disorder Sister    Liver cancer Sister    Breast cancer Sister    Colon cancer Sister    Depression Sister    Diabetes Sister    COPD Sister    Depression Sister    Anxiety disorder Sister    Skin cancer Sister    Diabetes Brother    Depression Brother    Skin cancer Maternal Aunt    Diabetes Maternal Aunt    Cervical cancer Paternal Aunt     Social History Social History   Tobacco Use   Smoking status: Never  Smokeless tobacco: Never  Vaping Use   Vaping Use: Never used  Substance Use Topics   Alcohol use: No   Drug use: No    Review of Systems  Review of Systems  Constitutional:  Negative for chills and fever.  HENT:  Negative for sore throat.   Eyes:  Negative for pain.  Respiratory:  Negative for cough and stridor.   Cardiovascular:  Negative for chest pain.  Gastrointestinal:  Negative for vomiting.  Genitourinary:  Negative for dysuria.  Musculoskeletal:  Negative for myalgias.  Skin:  Negative for rash.  Neurological:  Negative for seizures, loss of consciousness and headaches.  Psychiatric/Behavioral:  Negative for suicidal ideas.   All other systems reviewed and are negative.    ____________________________________________   PHYSICAL EXAM:  VITAL SIGNS: ED Triage Vitals  Enc Vitals Group     BP 01/10/21 1458 139/70     Pulse Rate 01/10/21 1458 83     Resp 01/10/21 1458 19     Temp 01/10/21 1458 98.2 F (36.8 C)     Temp Source 01/10/21  1458 Oral     SpO2 01/10/21 1458 94 %     Weight --      Height --      Head Circumference --      Peak Flow --      Pain Score 01/10/21 1320 0     Pain Loc --      Pain Edu? --      Excl. in [place]? --    Vitals:   01/10/21 1830 01/10/21 2000  BP: (!) 143/58 (!) 134/59  Pulse: 89 92  Resp:    Temp:    SpO2: 96% 97%   Physical Exam Vitals and nursing note reviewed.  Constitutional:      General: She is not in acute distress.    Appearance: She is well-developed.  HENT:     Head: Normocephalic and atraumatic.     Right Ear: External ear normal.     Left Ear: External ear normal.     Nose: Nose normal.     Mouth/Throat:     Mouth: Mucous membranes are dry.  Eyes:     Conjunctiva/sclera: Conjunctivae normal.  Cardiovascular:     Rate and Rhythm: Normal rate and regular rhythm.     Heart sounds: No murmur heard. Pulmonary:     Effort: Pulmonary effort is normal. No respiratory distress.     Breath sounds: Normal breath sounds.  Abdominal:     Palpations: Abdomen is soft.     Tenderness: There is no abdominal tenderness.  Musculoskeletal:     Cervical back: Neck supple.  Skin:    General: Skin is warm and dry.     Capillary Refill: Capillary refill takes 2 to 3 seconds.  Neurological:     General: No focal deficit present.     Mental Status: She is alert.  Psychiatric:        Mood and Affect: Mood normal.     ____________________________________________   LABS (all labs ordered are listed, but only abnormal results are displayed)  Labs Reviewed  CBC - Abnormal; Notable for the following components:      Result Value   RDW 20.0 (*)    Platelets 68 (*)    All other components within normal limits  COMPREHENSIVE METABOLIC PANEL - Abnormal; Notable for the following components:   Calcium 8.8 (*)    Albumin 3.4 (*)    Total Bilirubin 1.8 (*)  All other components within normal limits  LIPASE, BLOOD    ____________________________________________  EKG  ____________________________________________  RADIOLOGY  ED MD interpretation: Soft tissue the neck shows no acute thoracic process.  Chronic upper thoracic pressure noted.  Chest x-ray shows some chronic volume loss in the right hemithorax likely secondary to chronic loculated effusion is also evidence of likely an effusion on the right and possible nodule.  Official radiology report(s): DG Neck Soft Tissue  Result Date: 01/10/2021 CLINICAL DATA:  Dysphagia. Inability to swallow. Throat tightness. History of esophageal stricture. EXAM: NECK SOFT TISSUES - 1+ VIEW COMPARISON:  None. FINDINGS: There is no evidence of retropharyngeal soft tissue swelling or epiglottic enlargement. The cervical airway is unremarkable. No radio-opaque foreign body identified. Patient is edentulous. Degenerative change in the cervical spine. There upper thoracic compression fractures, likely chronic. IMPRESSION: 1. Negative soft tissue neck radiographs. 2. Upper thoracic compression fractures, likely chronic. Electronically Signed   By: Keith Rake M.D.   On: 01/10/2021 19:18   DG Chest 2 View  Result Date: 01/10/2021 CLINICAL DATA:  Dysphagia.  History of esophageal stricture. EXAM: CHEST - 2 VIEW COMPARISON:  12/15/2019, most recent chest CT 02/10/2018 FINDINGS: There is chronic volume loss in the right hemithorax with smooth right apical opacity, likely chronic loculated pleural effusion based on prior CT. Right basilar opacity and blunting of the costophrenic angle consistent with pleural effusion and atelectasis, rounded density in the right lower lung zone was not seen on prior imaging. The heart is normal in size. Aortic atherosclerosis. Diffuse bronchial thickening. No focal left lung opacity or significant left pleural effusion. Scoliotic curvature of the spine. Multiple chronic thoracic compression fractures in the midthoracic spine, also seen on prior  CT. IMPRESSION: 1. Chronic volume loss in the right hemithorax with smooth right apical opacity, likely chronic loculated pleural effusion based on prior CT. 2. Right basilar opacity and blunting of the costophrenic angle consistent with pleural effusion and atelectasis. Rounded density in the right lower lung zone was not seen on prior imaging. This may represent loculated fluid in the fissure, however recommend chest CT to exclude the possibility of pulmonary nodule. Recommend IV contrast in the absence of contraindications. Electronically Signed   By: Keith Rake M.D.   On: 01/10/2021 19:22    ____________________________________________   PROCEDURES  Procedure(s) performed (including Critical Care):  Procedures   ____________________________________________   INITIAL IMPRESSION / ASSESSMENT AND PLAN / ED COURSE      Patient presents with above-stated history exam with inability to tolerate p.o. since yesterday when eating some meat and nursing facility.  On arrival she is afebrile and hemodynamically stable.  Her lungs are clear bilaterally and oropharynx is unremarkable.  History is concerning for retained esophageal food bolus.  She has been having diarrhea which is chronic for her and have a lower suspicion for SBO.  She does appear mildly dehydrated but does not appear septic or toxic at this time.  We will hydrate with IV fluids obtained a chest x-ray and give a trial of glucagon and if this is unsuccessful consult GI for possible endoscopy.  No obvious large obstruction on plain film of the neck and chest which otherwise shows chronic old thoracic compression fracture and old loculated pleural effusion which patient sister states  .  There is also a nodule which I discussed with sister and recommendation for PCP to coordinate outpatient further evaluation as in line with patient's goals of care.  Following glucagon patient was able  tolerate p.o.  Given she has now tolerated p.o.  and feeling much better I think she is stable for discharge with close outpatient GI and PCP follow-up.  Discharged stable condition.  Strict return precautions advised and discussed.       ____________________________________________   FINAL CLINICAL IMPRESSION(S) / ED DIAGNOSES  Final diagnoses:  Dysphagia, unspecified type  Lung nodule    Medications  lactated ringers bolus 1,000 mL (1,000 mLs Intravenous New Bag/Given 01/10/21 1941)  ondansetron (ZOFRAN) injection 4 mg (4 mg Intravenous Given 01/10/21 1941)  glucagon (human recombinant) (GLUCAGEN) injection 1 mg (1 mg Intravenous Given 01/10/21 1941)     ED Discharge Orders     None        Note:  This document was prepared using Dragon voice recognition software and may include unintentional dictation errors.    Lucrezia Starch, MD 01/10/21 2053

## 2021-01-21 ENCOUNTER — Other Ambulatory Visit: Payer: Self-pay

## 2021-01-22 ENCOUNTER — Other Ambulatory Visit: Payer: Self-pay

## 2021-01-22 ENCOUNTER — Encounter: Payer: Self-pay | Admitting: Gastroenterology

## 2021-01-22 ENCOUNTER — Ambulatory Visit (INDEPENDENT_AMBULATORY_CARE_PROVIDER_SITE_OTHER): Payer: Medicare Other | Admitting: Gastroenterology

## 2021-01-22 VITALS — BP 120/77 | HR 72 | Temp 98.8°F

## 2021-01-22 DIAGNOSIS — K7460 Unspecified cirrhosis of liver: Secondary | ICD-10-CM | POA: Diagnosis not present

## 2021-01-22 DIAGNOSIS — R1319 Other dysphagia: Secondary | ICD-10-CM | POA: Diagnosis not present

## 2021-01-22 NOTE — Progress Notes (Signed)
Cephas Darby, MD [address]  [place]  [place], [place] [address]  Main: [phone]  Fax: [phone]    Gastroenterology Consultation  Referring Provider:     Kirk Ruths, MD Primary Care Physician:  Kirk Ruths, MD Primary Gastroenterologist:  Dr. Cephas Darby Reason for Consultation:   Decompensated cirrhosis, choking episodes        HPI:   Cassidy [name] is a 80 y.o. female referred by Dr. Ouida Sills, Ocie Cornfield, MD  for consultation & management of recent episode of food impaction.  Patient went to [facility] on 8/26 after she swallowed whole shrimp when she went outside with her sister.  She went to ER right away, did not get endoscopy with removal of food bolus.  There was evidence of irritation in distal esophagus.  No obvious stricture noted.  She has been started on omeprazole 40 mg twice daily.  Apparently, patient does not like to wear dentures even though they fit her well.  According to patient's sister, she does not to.  She likes to swallow everything.  Since last episode, she had another choking episode when she tried to eat pickled beets.  The symptoms lasted for 3 hours and spontaneously passed down at home.  Patient's sister is having hard time managing her diet, with restrictive eating, preparing her food.  She does not have any help at home except from her husband.  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  Her hemoglobin is stable.  She denies any other GI symptoms.  Patient has history of obesity, fatty liver, hypothyroidism with chronic liver disease, splenomegaly, chronic thrombocytopenia and portal hypertension   Follow-up visit 10/09/2020 Patient is now at [facility].  Patient is accompanied by her sister who is her healthcare power of attorney.  She saw me last visit for episodes of choking and started her on omeprazole 40 mg daily and she has been doing well overall until recently.   She had an incident with solid food.  She had to insert her finger to get the food out.  Patient sister is not sure if it was intentional or if she really had a choking episode.  The group home reported episodes of regurgitation with liquids.  Patient sister states that as long as she is on a soft diet, she was doing well.  Her weight has been maintained well.  Patient denies any epigastric pain.  She reports having good bowel movements daily.  She does have worsening of dementia.  She is compliant with lactulose and rifaximin.  Follow-up visit 01/22/2021 Patient is here for post ER follow-up.  She went to ER on 01/08/2021 from [facility] because of 2 days history of continuous nausea, vomiting, she was not able to keep anything down.  Per patient's sister who accompanied her she has been doing well since then.  Her weight has been stable.  Her main concern is that she drinks and eats fast, she does not have any dentures.  Patient is receiving soft diet at [facility].  She can hear gurgling in her lower chest.  Patient denies any swelling of legs.  She is almost sedentary.  There is no abdominal pain.  Labs were unrevealing.  No evidence of anemia.  Renal function and electrolytes were normal.  LFTs were okay.  NSAIDs: None   Antiplts/Anticoagulants/Anti thrombotics: None   GI Procedures: She had several EGDs and colonoscopies in the past.  No known varices or portal hypertensive gastropathy Biopsies negative for H. pylori infection  EGD 12/15/2019 - Food in the distal esophagus. Removal was successful. - Erythema in the distal esophagus. - Erythematous mucosa in the antrum. - Normal examined duodenum.  EGD 12/20/2014 - Moderate Schatzki ring. Dilated. - Erythematous mucosa in the antrum. Biopsied. - Normal examined duodenum. DIAGNOSIS:  A. STOMACH; COLD BIOPSY:  - ANTRAL MUCOSA WITH FEATURES OF REACTIVE GASTROPATHY, SEE NOTE.  - OXYNTIC MUCOSA WITH FOCAL MINIMAL CHRONIC GASTRITIS.  -  NEGATIVE FOR H. PYLORI, DYSPLASIA, AND MALIGNANCY  Past Medical History:  Diagnosis Date   Anxiety    Depression    Fatty liver    GERD (gastroesophageal reflux disease)    Hypothyroidism    Obesity    Thyroid disease     Past Surgical History:  Procedure Laterality Date   APPENDECTOMY     DILATATION & CURETTAGE/HYSTEROSCOPY WITH MYOSURE N/A 02/04/2018   Procedure: [place];  Surgeon: Ward, Honor Loh, MD;  Location: [facility] ORS;  Service: Gynecology;  Laterality: N/A;   ESOPHAGOGASTRODUODENOSCOPY N/A 12/15/2019   Procedure: ESOPHAGOGASTRODUODENOSCOPY (EGD);  Surgeon: Virgel Manifold, MD;  Location: [facility] ENDOSCOPY;  Service: Endoscopy;  Laterality: N/A;   ESOPHAGOGASTRODUODENOSCOPY (EGD) WITH PROPOFOL N/A 12/20/2014   Procedure: ESOPHAGOGASTRODUODENOSCOPY (EGD) WITH PROPOFOL;  Surgeon: Manya Silvas, MD;  Location: [facility] ENDOSCOPY;  Service: Endoscopy;  Laterality: N/A;   SAVORY DILATION N/A 12/20/2014   Procedure: SAVORY DILATION;  Surgeon: Manya Silvas, MD;  Location: [facility] ENDOSCOPY;  Service: Endoscopy;  Laterality: N/A;   TONSILLECTOMY       Current Outpatient Medications:    azelastine (ASTELIN) 0.1 % nasal spray, Place 1 spray into both nostrils 2 (two) times daily. Use in each nostril as directed, Disp: , Rfl:    busPIRone (BUSPAR) 15 MG tablet, Take 15 mg by mouth 3 (three) times daily., Disp: , Rfl:    clonazePAM (KLONOPIN) 0.25 MG disintegrating tablet, Take 0.25 mg by mouth 2 (two) times daily. (Patient not taking: No sig reported), Disp: , Rfl:    escitalopram (LEXAPRO) 20 MG tablet, TAKE ONE TABLET BY MOUTH EVERY DAY, Disp: 90 tablet, Rfl: 1   fexofenadine (ALLEGRA) 180 MG tablet, Take 180 mg by mouth daily., Disp: , Rfl:    fluticasone (FLONASE) 50 MCG/ACT nasal spray, Place 2 sprays into both nostrils daily., Disp: , Rfl:    furosemide (LASIX) 20 MG tablet, Take 20 mg by mouth daily., Disp: , Rfl:    lactulose (CHRONULAC)  10 GM/15ML solution, Take 15 mLs (10 g total) by mouth daily. Decrease from BID due to excessive diarrhea., Disp: , Rfl:    levothyroxine (SYNTHROID) 50 MCG tablet, Take 50 mcg by mouth every morning., Disp: , Rfl:    omeprazole (PRILOSEC) 40 MG capsule, Take 1 capsule (40 mg total) by mouth 2 (two) times daily., Disp: 90 capsule, Rfl: 0   ondansetron (ZOFRAN) 4 MG tablet, Take 4 mg by mouth every 8 (eight) hours as needed., Disp: , Rfl:    QUEtiapine (SEROQUEL) 100 MG tablet, TAKE 1 TABLET BY MOUTH NIGHTLY, Disp: 90 tablet, Rfl: 1   spironolactone (ALDACTONE) 100 MG tablet, Take 1 tablet (100 mg total) by mouth daily., Disp: 30 tablet, Rfl: 0   traZODone (DESYREL) 100 MG tablet, Take 100 mg by mouth at bedtime., Disp: , Rfl:    XIFAXAN 550 MG TABS tablet, Take 1 tablet by mouth 2 (two) times daily., Disp: , Rfl:   Family History  Problem Relation Age of Onset   Diabetes Mother    COPD Mother    Kidney disease Mother    Depression Mother    Hypertension Mother    Heart attack Father    Aneurysm Father    Anxiety disorder Sister    Depression Sister    Diabetes Sister    Hypertension Sister    Atrial fibrillation Brother    Spinal muscular atrophy Brother    Breast cancer Sister    Bone cancer Sister    Depression Sister    Anxiety disorder Sister    Liver cancer Sister    Breast cancer Sister    Colon cancer Sister    Depression Sister    Diabetes Sister    COPD Sister    Depression Sister    Anxiety disorder Sister    Skin cancer Sister    Diabetes Brother    Depression Brother    Skin cancer Maternal Aunt    Diabetes Maternal Aunt    Cervical cancer Paternal Aunt      Social History   Tobacco Use   Smoking status: Never   Smokeless tobacco: Never  Vaping Use   Vaping Use: Never used  Substance Use Topics   Alcohol use: No   Drug use: No    Allergies as of 01/22/2021 - Review Complete 01/22/2021  Allergen Reaction Noted   Sulfa antibiotics Shortness Of  Breath 11/27/2014    Review of Systems:    All systems reviewed and negative except where noted in HPI.   Physical Exam:  BP 120/77 (BP Location: Right Wrist, Patient Position: Sitting, Cuff Size: Small)   Pulse 72   Temp 98.8 F (37.1 C) (Oral)   LMP  (LMP Unknown)  No LMP recorded (lmp unknown). Patient is postmenopausal.  General:   Alert, moderately built, moderately nourished, pleasant and cooperative in NAD Head:  Normocephalic and atraumatic. Eyes:  Sclera clear, no icterus.   Conjunctiva pink. Ears:  Normal auditory acuity. Nose:  No deformity, discharge, or lesions. Mouth:  No deformity or lesions,oropharynx pink & moist. Neck:  Supple; no masses or thyromegaly. Lungs:  Respirations even and unlabored.  Clear throughout to auscultation.   No wheezes, crackles, or rhonchi. No acute distress. Heart:  Regular rate and rhythm; no murmurs, clicks, rubs, or gallops. Abdomen:  Normal bowel sounds. Soft, non-tender and non-distended without masses, hepatosplenomegaly or hernias noted.  No guarding or rebound tenderness.   Rectal: Not performed Msk:  Symmetrical without gross deformities. Good, equal movement & strength bilaterally. Pulses:  Normal pulses noted. Extremities:  No clubbing or edema.  No cyanosis. Neurologic:  Alert and oriented x2;  grossly normal neurologically. Skin:  Intact without significant lesions or rashes. No jaundice.  Spontaneous bruises on her bilateral upper extremities Psych:  Alert and cooperative. Normal mood and affect.  Imaging Studies: Reviewed  Assessment and Plan:   Cassidy [name] is a 80 y.o. Caucasian female with obesity, fatty liver, hypothyroidism with chronic liver disease, splenomegaly, chronic thrombocytopenia and portal hypertension with recurrent right-sided pleural effusions, status post therapeutic thoracentesis in 01/2018.  Fluid analysis consistent with hepatic hydrothorax, serum-to-pleural fluid albumin gradient >1.1.  History of  food impaction s/p EGD with food bolus removal in 11/2019  Dysphagia: History of Food impaction, she does not have any dentures Patient is not willing to use dentures Continue mechanical soft diet EGD revealed mild esophagitis, given recurrent episodes, continue omeprazole to 40 mg daily.  I also discussed about  endoscopy to rule out peptic stricture and possible dilation.  However, patient's sister wants to defer any further testing and patient herself refused to undergo EGD and barium esophagogram. 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  Patient is not a candidate for anticoagulation given her age and comorbidities.  Appreciate Dr. Collie Siad recommendations Manifested as thrombocytopenia, bilateral swelling of legs, hepatic hydrothorax, hepatic encephalopathy Continue furosemide 20 mg and spironolactone 100 mg daily, low-sodium diet Monitor electrolytes closely Echocardiogram reveals EF of 60 to 85%, systolic function normal, no evidence of pulmonary hypertension  Varices:EGD in 2016 did not reveal varices or portal hypertensive gastropathy No evidence of active GI bleed.  EGD on 11/2019 did not reveal any evidence of varices PSE: Continue rifaximin, lactulose 15 mL daily to have at least 2 soft bowel movements per day [place] screening: No evidence of liver lesions based on the CT scan in 01/2018, right upper quadrant ultrasound for  [place] screening did not reveal any liver lesions.  Normal serum AFP levels  Right lower lobe density on chest x-ray Follow-up with Dr. Tasia Catchings for CT chest   Follow up every 6  months  Cephas Darby, MD

## 2021-02-12 ENCOUNTER — Encounter (HOSPITAL_COMMUNITY): Payer: Self-pay | Admitting: Radiology

## 2021-02-18 ENCOUNTER — Other Ambulatory Visit: Payer: Self-pay

## 2021-02-18 ENCOUNTER — Inpatient Hospital Stay: Payer: Medicare Other | Attending: Oncology

## 2021-02-18 DIAGNOSIS — D500 Iron deficiency anemia secondary to blood loss (chronic): Secondary | ICD-10-CM

## 2021-02-18 DIAGNOSIS — D696 Thrombocytopenia, unspecified: Secondary | ICD-10-CM | POA: Diagnosis not present

## 2021-02-18 LAB — IRON AND TIBC
Iron: 59 ug/dL (ref 28–170)
Saturation Ratios: 20 % (ref 10.4–31.8)
TIBC: 298 ug/dL (ref 250–450)
UIBC: 239 ug/dL

## 2021-02-18 LAB — CBC WITH DIFFERENTIAL/PLATELET
Abs Immature Granulocytes: 0.01 10*3/uL (ref 0.00–0.07)
Basophils Absolute: 0 10*3/uL (ref 0.0–0.1)
Basophils Relative: 1 %
Eosinophils Absolute: 0.1 10*3/uL (ref 0.0–0.5)
Eosinophils Relative: 4 %
HCT: 40.2 % (ref 36.0–46.0)
Hemoglobin: 13.3 g/dL (ref 12.0–15.0)
Immature Granulocytes: 0 %
Lymphocytes Relative: 12 %
Lymphs Abs: 0.3 10*3/uL — ABNORMAL LOW (ref 0.7–4.0)
MCH: 30.6 pg (ref 26.0–34.0)
MCHC: 33.1 g/dL (ref 30.0–36.0)
MCV: 92.6 fL (ref 80.0–100.0)
Monocytes Absolute: 0.3 10*3/uL (ref 0.1–1.0)
Monocytes Relative: 9 %
Neutro Abs: 2.1 10*3/uL (ref 1.7–7.7)
Neutrophils Relative %: 74 %
Platelets: 58 10*3/uL — ABNORMAL LOW (ref 150–400)
RBC: 4.34 MIL/uL (ref 3.87–5.11)
RDW: 15.8 % — ABNORMAL HIGH (ref 11.5–15.5)
WBC: 2.9 10*3/uL — ABNORMAL LOW (ref 4.0–10.5)
nRBC: 0 % (ref 0.0–0.2)

## 2021-02-18 LAB — FERRITIN: Ferritin: 30 ng/mL (ref 11–307)

## 2021-02-20 ENCOUNTER — Encounter: Payer: Self-pay | Admitting: Nurse Practitioner

## 2021-02-20 ENCOUNTER — Inpatient Hospital Stay: Payer: Medicare Other | Attending: Oncology | Admitting: Nurse Practitioner

## 2021-02-20 ENCOUNTER — Other Ambulatory Visit: Payer: Self-pay

## 2021-02-20 VITALS — BP 118/53 | HR 72 | Temp 98.0°F | Resp 17 | Wt 150.0 lb

## 2021-02-20 DIAGNOSIS — K7581 Nonalcoholic steatohepatitis (NASH): Secondary | ICD-10-CM | POA: Diagnosis not present

## 2021-02-20 DIAGNOSIS — F0390 Unspecified dementia without behavioral disturbance: Secondary | ICD-10-CM | POA: Insufficient documentation

## 2021-02-20 DIAGNOSIS — J90 Pleural effusion, not elsewhere classified: Secondary | ICD-10-CM | POA: Diagnosis not present

## 2021-02-20 DIAGNOSIS — Z79899 Other long term (current) drug therapy: Secondary | ICD-10-CM | POA: Diagnosis not present

## 2021-02-20 DIAGNOSIS — E039 Hypothyroidism, unspecified: Secondary | ICD-10-CM | POA: Diagnosis not present

## 2021-02-20 DIAGNOSIS — K7460 Unspecified cirrhosis of liver: Secondary | ICD-10-CM | POA: Insufficient documentation

## 2021-02-20 DIAGNOSIS — F418 Other specified anxiety disorders: Secondary | ICD-10-CM | POA: Insufficient documentation

## 2021-02-20 DIAGNOSIS — Z808 Family history of malignant neoplasm of other organs or systems: Secondary | ICD-10-CM | POA: Diagnosis not present

## 2021-02-20 DIAGNOSIS — M545 Low back pain, unspecified: Secondary | ICD-10-CM | POA: Insufficient documentation

## 2021-02-20 DIAGNOSIS — Z803 Family history of malignant neoplasm of breast: Secondary | ICD-10-CM | POA: Insufficient documentation

## 2021-02-20 DIAGNOSIS — D696 Thrombocytopenia, unspecified: Secondary | ICD-10-CM | POA: Diagnosis not present

## 2021-02-20 DIAGNOSIS — I81 Portal vein thrombosis: Secondary | ICD-10-CM | POA: Insufficient documentation

## 2021-02-20 DIAGNOSIS — K219 Gastro-esophageal reflux disease without esophagitis: Secondary | ICD-10-CM | POA: Diagnosis not present

## 2021-02-20 DIAGNOSIS — R161 Splenomegaly, not elsewhere classified: Secondary | ICD-10-CM | POA: Insufficient documentation

## 2021-02-20 DIAGNOSIS — K7682 Hepatic encephalopathy: Secondary | ICD-10-CM | POA: Diagnosis not present

## 2021-02-20 DIAGNOSIS — D500 Iron deficiency anemia secondary to blood loss (chronic): Secondary | ICD-10-CM | POA: Insufficient documentation

## 2021-02-20 DIAGNOSIS — R918 Other nonspecific abnormal finding of lung field: Secondary | ICD-10-CM | POA: Insufficient documentation

## 2021-02-20 NOTE — Progress Notes (Signed)
Patient here for oncology follow-up appointment, concerns of dizziness, decreased appetite and leg weakness

## 2021-02-20 NOTE — Progress Notes (Signed)
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 She was previously seen by Dr Tasia Catchings in 2019 then lost to follow up. Due to history of dementia, history is provided by sister, [name] who is patient's power of attorney. Patient has history of NASH cirrhosis, splenomegaly and chronic thrombocytopenia.   10/30/2020, patient had ultrasound abdomen right upper quadrant for [place] surveillance. No focal liver lesion was identified.  Interval development of nonocclusive portal vein thrombosis.  11/05/2020 -11/06/2020 she was recently admitted due to diarrhea.  C. difficile was not obtained as no additional loose stool after presentation.  There is report of melena, no signs of active bleeding during the presentation.  Hemoglobin was stable in the 9s  Due to presence of portal vein thrombosis, it was recommended during hospitalization that patient return to [facility] for follow up.   Due to low iron levels she was started on oral iron twice a day.   Interval History: Patient is 80 year old female with above history who returns to clinic for labs, further evaluation. History provided by sister and POA though patient is conversant, alert. She is taking oral iron twice a day without side effects. During recent hospitalization, xray was performed for dysphagia and per sister there was an abnormal finding.    Review of systems- Review of Systems  Unable to perform ROS: Dementia  Constitutional:  Positive for malaise/fatigue.  HENT:  Negative for sore throat.   Gastrointestinal:  Negative for abdominal pain and blood in stool.       Dark stool   Neurological:  Positive for dizziness.  Endo/Heme/Allergies:  Bruises/bleeds easily.  Psychiatric/Behavioral:  Positive for memory loss.    Allergies  Allergen Reactions   Sulfa Antibiotics Shortness Of Breath    Patient Active Problem List   Diagnosis Date Noted   Portal vein thrombosis 11/21/2020   GI bleed 11/05/2020   Intractable diarrhea 11/05/2020   Resides in skilled nursing facility 08/24/2020   Acute hypoxemic respiratory failure ([place]) 12/15/2019   Esophageal obstruction due to food impaction    Stomach irritation    Foreign body in esophagus    MDD (major depressive disorder), recurrent, in full remission ([place]) 09/08/2019   Insomnia due to mental disorder 09/08/2019   Major neurocognitive disorder due to multiple etiologies ([place]) 09/08/2019   Uses walker 11/24/2018   Dementia arising in the senium and presenium ([place]) 11/24/2018   General weakness 05/08/2018   Edema of upper extremity 05/08/2018   Liver cirrhosis secondary to NASH ([place]) 03/08/2018   Pleural effusion on right 02/13/2018   Protein-calorie malnutrition, severe 02/04/2018   Weight loss 01/19/2017   Severe recurrent major depression without psychotic features ([place]) 06/19/2016   Hepatic encephalopathy 08/13/2015   Thrombocytopenia ([place]) 07/23/2015   Amnesia 03/20/2015   Major depression in remission ([place]) 03/12/2015   Gonalgia 01/05/2015   Arthritis 11/27/2014   Depression, major, recurrent, in partial remission ([place]) 11/27/2014   Encounter for general adult medical examination without abnormal findings 08/25/2014   Chronic LBP 08/15/2014   Complete rotator cuff rupture of left shoulder 05/01/2014   Infraspinatus tenosynovitis 05/01/2014   Other synovitis and tenosynovitis, right shoulder 05/01/2014   Difficulty in walking 11/30/2013  Arthritis, degenerative 10/01/2013   Steatohepatitis 10/01/2013   Orthostasis 10/01/2013   Appendicular ataxia 09/26/2013   Fall 09/26/2013   Osteoporosis with  fracture 09/06/2013   Clinical depression 03/25/2012   Adult hypothyroidism 03/25/2012   Esophageal stenosis 07/03/2009   Back ache 03/08/2003   GAD (generalized anxiety disorder) 12/27/2002    Past Medical History:  Diagnosis Date   Anxiety    Depression    Fatty liver    GERD (gastroesophageal reflux disease)    Hypothyroidism    Obesity    Thyroid disease     Past Surgical History:  Procedure Laterality Date   APPENDECTOMY     DILATATION & CURETTAGE/HYSTEROSCOPY WITH MYOSURE N/A 02/04/2018   Procedure: [place];  Surgeon: Ward, Honor Loh, MD;  Location: [facility] ORS;  Service: Gynecology;  Laterality: N/A;   ESOPHAGOGASTRODUODENOSCOPY N/A 12/15/2019   Procedure: ESOPHAGOGASTRODUODENOSCOPY (EGD);  Surgeon: Virgel Manifold, MD;  Location: [facility] ENDOSCOPY;  Service: Endoscopy;  Laterality: N/A;   ESOPHAGOGASTRODUODENOSCOPY (EGD) WITH PROPOFOL N/A 12/20/2014   Procedure: ESOPHAGOGASTRODUODENOSCOPY (EGD) WITH PROPOFOL;  Surgeon: Manya Silvas, MD;  Location: [facility] ENDOSCOPY;  Service: Endoscopy;  Laterality: N/A;   SAVORY DILATION N/A 12/20/2014   Procedure: SAVORY DILATION;  Surgeon: Manya Silvas, MD;  Location: [facility] ENDOSCOPY;  Service: Endoscopy;  Laterality: N/A;   TONSILLECTOMY      Social History   Socioeconomic History   Marital status: Widowed    Spouse name: Not on file   Number of children: 3   Years of education: Not on file   Highest education level: Not on file  Occupational History   Not on file  Tobacco Use   Smoking status: Never   Smokeless tobacco: Never  Vaping Use   Vaping Use: Never used  Substance and Sexual Activity   Alcohol use: No   Drug use: No   Sexual activity: Not Currently    Birth control/protection: Post-menopausal  Other Topics Concern   Not on file  Social History Narrative   Not on file   Social Determinants of Health   Financial Resource Strain: Not on file  Food Insecurity: Not  on file  Transportation Needs: Not on file  Physical Activity: Not on file  Stress: Not on file  Social Connections: Not on file  Intimate Partner Violence: Not on file     Family History  Problem Relation Age of Onset   Diabetes Mother    COPD Mother    Kidney disease Mother    Depression Mother    Hypertension Mother    Heart attack Father    Aneurysm Father    Anxiety disorder Sister    Depression Sister    Diabetes Sister    Hypertension Sister    Atrial fibrillation Brother    Spinal muscular atrophy Brother    Breast cancer Sister    Bone cancer Sister    Depression Sister    Anxiety disorder Sister    Liver cancer Sister    Breast cancer Sister    Colon cancer Sister    Depression Sister    Diabetes Sister    COPD Sister    Depression Sister    Anxiety disorder Sister    Skin cancer Sister    Diabetes Brother    Depression Brother    Skin cancer Maternal Aunt    Diabetes Maternal Aunt    Cervical cancer Paternal Aunt     Current Outpatient Medications:    acetaminophen (TYLENOL)  325 MG tablet, Take 650 mg by mouth every 6 (six) hours as needed., Disp: , Rfl:    azelastine (ASTELIN) 0.1 % nasal spray, Place 1 spray into both nostrils 2 (two) times daily. Use in each nostril as directed, Disp: , Rfl:    busPIRone (BUSPAR) 15 MG tablet, Take 15 mg by mouth 3 (three) times daily., Disp: , Rfl:    clonazePAM (KLONOPIN) 0.25 MG disintegrating tablet, Take 0.25 mg by mouth 2 (two) times daily., Disp: , Rfl:    escitalopram (LEXAPRO) 20 MG tablet, TAKE ONE TABLET BY MOUTH EVERY DAY, Disp: 90 tablet, Rfl: 1   ferrous sulfate 325 (65 FE) MG tablet, Take 325 mg by mouth daily with breakfast., Disp: , Rfl:    fexofenadine (ALLEGRA) 180 MG tablet, Take 180 mg by mouth daily., Disp: , Rfl:    fluticasone (FLONASE) 50 MCG/ACT nasal spray, Place 2 sprays into both nostrils daily., Disp: , Rfl:    furosemide (LASIX) 20 MG tablet, Take 20 mg by mouth daily., Disp: , Rfl:     lactulose (CHRONULAC) 10 GM/15ML solution, Take 15 mLs (10 g total) by mouth daily. Decrease from BID due to excessive diarrhea., Disp: , Rfl:    levothyroxine (SYNTHROID) 50 MCG tablet, Take 50 mcg by mouth every morning., Disp: , Rfl:    omeprazole (PRILOSEC) 40 MG capsule, Take 1 capsule (40 mg total) by mouth 2 (two) times daily., Disp: 90 capsule, Rfl: 0   QUEtiapine (SEROQUEL) 100 MG tablet, TAKE 1 TABLET BY MOUTH NIGHTLY, Disp: 90 tablet, Rfl: 1   spironolactone (ALDACTONE) 100 MG tablet, Take 1 tablet (100 mg total) by mouth daily., Disp: 30 tablet, Rfl: 0   torsemide (DEMADEX) 10 MG tablet, Take 10 mg by mouth daily., Disp: , Rfl:    traZODone (DESYREL) 100 MG tablet, Take 100 mg by mouth at bedtime., Disp: , Rfl:    XIFAXAN 550 MG TABS tablet, Take 1 tablet by mouth 2 (two) times daily., Disp: , Rfl:    ondansetron (ZOFRAN) 4 MG tablet, Take 4 mg by mouth every 8 (eight) hours as needed. (Patient not taking: Reported on 02/20/2021), Disp: , Rfl:    Physical exam:  Vitals:   02/20/21 1013  BP: (!) 118/53  Pulse: 72  Resp: 17  Temp: 98 F (36.7 C)  TempSrc: Tympanic  SpO2: 96%  Weight: 150 lb (68 kg)   Physical Exam Vitals reviewed.  Constitutional:      General: She is not in acute distress.    Appearance: She is not diaphoretic.     Comments: Frail elderly female sits in the wheelchair  HENT:     Head: Normocephalic and atraumatic.     Nose: Nose normal.     Mouth/Throat:     Pharynx: No oropharyngeal exudate.  Eyes:     General: No scleral icterus.    Conjunctiva/sclera: Conjunctivae normal.  Cardiovascular:     Rate and Rhythm: Normal rate.     Heart sounds: No murmur heard. Pulmonary:     Effort: Pulmonary effort is normal.     Breath sounds: Normal breath sounds.  Abdominal:     General: There is no distension.     Palpations: Abdomen is soft.     Tenderness: There is no abdominal tenderness.  Musculoskeletal:     Cervical back: Normal range of motion and  neck supple.  Skin:    General: Skin is warm and dry.  Neurological:     Mental Status: She  is alert. Mental status is at baseline.     Motor: No abnormal muscle tone.     Comments: Patient answers simple yes and no questions.  Psychiatric:        Mood and Affect: Affect normal.        Behavior: Behavior is cooperative.        Cognition and Memory: She exhibits impaired recent memory.      CMP Latest Ref Rng & Units 01/10/2021  Glucose 70 - 99 mg/dL 90  BUN 8 - 23 mg/dL 13  Creatinine 0.44 - 1.00 mg/dL 0.88  Sodium 135 - 145 mmol/L 138  Potassium 3.5 - 5.1 mmol/L 3.9  Chloride 98 - 111 mmol/L 107  CO2 22 - 32 mmol/L 25  Calcium 8.9 - 10.3 mg/dL 8.8(L)  Total Protein 6.5 - 8.1 g/dL 6.6  Total Bilirubin 0.3 - 1.2 mg/dL 1.8(H)  Alkaline Phos 38 - 126 U/L 85  AST 15 - 41 U/L 30  ALT 0 - 44 U/L 19   CBC Latest Ref Rng & Units 02/18/2021  WBC 4.0 - 10.5 K/uL 2.9(L)  Hemoglobin 12.0 - 15.0 g/dL 13.3  Hematocrit 36.0 - 46.0 % 40.2  Platelets 150 - 400 K/uL 58(L)    No results found. I have independently reviewed below imaging and agree with findings as described.  DG Chest 2 View- 01/10/21 FINDINGS: There is chronic volume loss in the right hemithorax with smooth right apical opacity, likely chronic loculated pleural effusion based on prior CT. Right basilar opacity and blunting of the costophrenic angle consistent with pleural effusion and atelectasis, rounded density in the right lower lung zone was not seen on prior imaging. The heart is normal in size. Aortic atherosclerosis. Diffuse bronchial thickening. No focal left lung opacity or significant left pleural effusion. Scoliotic curvature of the spine. Multiple chronic thoracic compression fractures in the midthoracic spine, also seen on prior CT.   IMPRESSION: 1. Chronic volume loss in the right hemithorax with smooth right apical opacity, likely chronic loculated pleural effusion based on prior CT. 2. Right basilar opacity and  blunting of the costophrenic angle consistent with pleural effusion and atelectasis. Rounded density in the right lower lung zone was not seen on prior imaging. This may represent loculated fluid in the fissure, however recommend chest CT to exclude the possibility of pulmonary nodule. Recommend IV contrast in the absence of contraindications.  Assessment and plan-  No diagnosis found. Thrombocytopenia- secondary to NASh cirrhossi with splenomegaly. Platelet count down but stable. No additional workup at this time.  Portal vein thrombosis- Not a candidate for anticoagulation given her multiple medical problems.  Fatigue & weakness- related to chronic illness Abnormal finding on Chest xray- chest xray for dysphagia revealed rounded density in RLL of lung not previously seen on imaging. May represent loculated fluid in the fissure vs pulmonary nodule. Discussed CT as recommended by radiology. Patient and sister, her POA, decline additional workup and prefer to minimize appointments.  Iron deficiency anemia- secondary to chronic blood loss. Declines additonal workup. She will continue oral iron ferrous sulfate 325 mg twice daily.   RTC for labs and follow up with Dr. Tasia Catchings in 3 months.   Thank you for allowing me to participate in the care of this patient.   Beckey Rutter, DNP, AGNP-C [place] at [facility] [phone] (clinic) 02/20/2021

## 2021-04-10 ENCOUNTER — Ambulatory Visit: Payer: Medicare Other | Admitting: Gastroenterology

## 2021-05-15 ENCOUNTER — Other Ambulatory Visit: Payer: Self-pay | Admitting: *Deleted

## 2021-05-15 DIAGNOSIS — D500 Iron deficiency anemia secondary to blood loss (chronic): Secondary | ICD-10-CM

## 2021-05-21 ENCOUNTER — Other Ambulatory Visit: Payer: Medicare Other

## 2021-05-23 ENCOUNTER — Inpatient Hospital Stay: Payer: Medicare (Managed Care) | Attending: Oncology | Admitting: Oncology

## 2021-05-23 ENCOUNTER — Inpatient Hospital Stay: Payer: Medicare (Managed Care)

## 2021-05-24 ENCOUNTER — Ambulatory Visit: Payer: Medicare Other | Admitting: Oncology

## 2022-07-19 ENCOUNTER — Other Ambulatory Visit: Payer: Self-pay

## 2022-07-19 ENCOUNTER — Observation Stay: Payer: Medicare (Managed Care)

## 2022-07-19 ENCOUNTER — Emergency Department: Payer: Medicare (Managed Care)

## 2022-07-19 ENCOUNTER — Inpatient Hospital Stay
Admission: EM | Admit: 2022-07-19 | Discharge: 2022-07-24 | DRG: 441 | Disposition: A | Discharge status: Skilled Nursing Facility | Payer: Medicare (Managed Care) | Attending: Internal Medicine | Admitting: Internal Medicine

## 2022-07-19 DIAGNOSIS — K7682 Hepatic encephalopathy: Principal | ICD-10-CM | POA: Diagnosis present

## 2022-07-19 DIAGNOSIS — G9340 Encephalopathy, unspecified: Secondary | ICD-10-CM | POA: Diagnosis not present

## 2022-07-19 DIAGNOSIS — J189 Pneumonia, unspecified organism: Secondary | ICD-10-CM | POA: Diagnosis not present

## 2022-07-19 DIAGNOSIS — Z825 Family history of asthma and other chronic lower respiratory diseases: Secondary | ICD-10-CM

## 2022-07-19 DIAGNOSIS — R188 Other ascites: Secondary | ICD-10-CM | POA: Diagnosis present

## 2022-07-19 DIAGNOSIS — R531 Weakness: Secondary | ICD-10-CM | POA: Diagnosis not present

## 2022-07-19 DIAGNOSIS — Z800 Family history of malignant neoplasm of digestive organs: Secondary | ICD-10-CM

## 2022-07-19 DIAGNOSIS — Z8049 Family history of malignant neoplasm of other genital organs: Secondary | ICD-10-CM

## 2022-07-19 DIAGNOSIS — Z515 Encounter for palliative care: Secondary | ICD-10-CM

## 2022-07-19 DIAGNOSIS — Z79890 Hormone replacement therapy (postmenopausal): Secondary | ICD-10-CM

## 2022-07-19 DIAGNOSIS — Z8249 Family history of ischemic heart disease and other diseases of the circulatory system: Secondary | ICD-10-CM

## 2022-07-19 DIAGNOSIS — F03C0 Unspecified dementia, severe, without behavioral disturbance, psychotic disturbance, mood disturbance, and anxiety: Secondary | ICD-10-CM | POA: Diagnosis present

## 2022-07-19 DIAGNOSIS — Z79899 Other long term (current) drug therapy: Secondary | ICD-10-CM

## 2022-07-19 DIAGNOSIS — E861 Hypovolemia: Secondary | ICD-10-CM | POA: Diagnosis present

## 2022-07-19 DIAGNOSIS — K219 Gastro-esophageal reflux disease without esophagitis: Secondary | ICD-10-CM | POA: Diagnosis present

## 2022-07-19 DIAGNOSIS — Z818 Family history of other mental and behavioral disorders: Secondary | ICD-10-CM

## 2022-07-19 DIAGNOSIS — Z882 Allergy status to sulfonamides status: Secondary | ICD-10-CM

## 2022-07-19 DIAGNOSIS — Z66 Do not resuscitate: Secondary | ICD-10-CM | POA: Diagnosis present

## 2022-07-19 DIAGNOSIS — F0390 Unspecified dementia without behavioral disturbance: Secondary | ICD-10-CM | POA: Diagnosis present

## 2022-07-19 DIAGNOSIS — R1310 Dysphagia, unspecified: Secondary | ICD-10-CM | POA: Diagnosis present

## 2022-07-19 DIAGNOSIS — K7581 Nonalcoholic steatohepatitis (NASH): Secondary | ICD-10-CM | POA: Diagnosis not present

## 2022-07-19 DIAGNOSIS — Z1152 Encounter for screening for COVID-19: Secondary | ICD-10-CM

## 2022-07-19 DIAGNOSIS — Z833 Family history of diabetes mellitus: Secondary | ICD-10-CM

## 2022-07-19 DIAGNOSIS — Z841 Family history of disorders of kidney and ureter: Secondary | ICD-10-CM

## 2022-07-19 DIAGNOSIS — Z803 Family history of malignant neoplasm of breast: Secondary | ICD-10-CM

## 2022-07-19 DIAGNOSIS — Z808 Family history of malignant neoplasm of other organs or systems: Secondary | ICD-10-CM

## 2022-07-19 DIAGNOSIS — E039 Hypothyroidism, unspecified: Secondary | ICD-10-CM | POA: Diagnosis present

## 2022-07-19 DIAGNOSIS — K7460 Unspecified cirrhosis of liver: Secondary | ICD-10-CM | POA: Diagnosis present

## 2022-07-19 DIAGNOSIS — R0902 Hypoxemia: Secondary | ICD-10-CM | POA: Diagnosis present

## 2022-07-19 DIAGNOSIS — D696 Thrombocytopenia, unspecified: Secondary | ICD-10-CM | POA: Diagnosis present

## 2022-07-19 HISTORY — DX: Pneumonia, unspecified organism: J18.9

## 2022-07-19 LAB — BASIC METABOLIC PANEL
Anion gap: 8 (ref 5–15)
BUN: 13 mg/dL (ref 8–23)
CO2: 26 mmol/L (ref 22–32)
Calcium: 8.6 mg/dL — ABNORMAL LOW (ref 8.9–10.3)
Chloride: 104 mmol/L (ref 98–111)
Creatinine, Ser: 0.81 mg/dL (ref 0.44–1.00)
GFR, Estimated: >60 mL/min (ref >60)
Glucose, Bld: 112 mg/dL — ABNORMAL HIGH (ref 70–99)
Potassium: 3.7 mmol/L (ref 3.5–5.1)
Sodium: 138 mmol/L (ref 135–145)

## 2022-07-19 LAB — URINALYSIS, ROUTINE W REFLEX MICROSCOPIC
Bacteria, UA: NONE SEEN
Bilirubin Urine: NEGATIVE
Glucose, UA: NEGATIVE mg/dL
Ketones, ur: NEGATIVE mg/dL
Leukocytes,Ua: NEGATIVE
Nitrite: NEGATIVE
Protein, ur: NEGATIVE mg/dL
Specific Gravity, Urine: 1.006 (ref 1.005–1.030)
Squamous Epithelial / HPF: NONE SEEN /HPF (ref 0–5)
pH: 7 (ref 5.0–8.0)

## 2022-07-19 LAB — CBC
HCT: 37.4 % (ref 36.0–46.0)
Hemoglobin: 12.4 g/dL (ref 12.0–15.0)
MCH: 31.2 pg (ref 26.0–34.0)
MCHC: 33.2 g/dL (ref 30.0–36.0)
MCV: 94.2 fL (ref 80.0–100.0)
Platelets: 52 10*3/uL — ABNORMAL LOW (ref 150–400)
RBC: 3.97 MIL/uL (ref 3.87–5.11)
RDW: 15.8 % — ABNORMAL HIGH (ref 11.5–15.5)
WBC: 2.9 10*3/uL — ABNORMAL LOW (ref 4.0–10.5)
nRBC: 0 % (ref 0.0–0.2)

## 2022-07-19 LAB — RESP PANEL BY RT-PCR (RSV, FLU A&B, COVID)  RVPGX2
Influenza A by PCR: NEGATIVE
Influenza B by PCR: NEGATIVE
Resp Syncytial Virus by PCR: NEGATIVE
SARS Coronavirus 2 by RT PCR: NEGATIVE

## 2022-07-19 LAB — HEPATIC FUNCTION PANEL
ALT: 16 U/L (ref 0–44)
AST: 33 U/L (ref 15–41)
Albumin: 3.1 g/dL — ABNORMAL LOW (ref 3.5–5.0)
Alkaline Phosphatase: 120 U/L (ref 38–126)
Bilirubin, Direct: 0.6 mg/dL — ABNORMAL HIGH (ref 0.0–0.2)
Indirect Bilirubin: 2.3 mg/dL — ABNORMAL HIGH (ref 0.3–0.9)
Total Bilirubin: 2.9 mg/dL — ABNORMAL HIGH (ref 0.3–1.2)
Total Protein: 7 g/dL (ref 6.5–8.1)

## 2022-07-19 LAB — STREP PNEUMONIAE URINARY ANTIGEN: Strep Pneumo Urinary Antigen: NEGATIVE

## 2022-07-19 MED ORDER — CLONAZEPAM 0.25 MG PO TBDP: 0.2500 mg | ORAL_TABLET | Freq: Two times a day (BID) | ORAL | Status: DC | PRN

## 2022-07-19 MED ORDER — RIFAXIMIN 550 MG PO TABS
550.0000 mg | ORAL_TABLET | Freq: Two times a day (BID) | ORAL | Status: DC
  Administered 2022-07-19 – 2022-07-24 (×10): 550 mg via ORAL
  Filled 2022-07-19 (×10): qty 1

## 2022-07-19 MED ORDER — ESCITALOPRAM OXALATE 10 MG PO TABS
10.0000 mg | ORAL_TABLET | Freq: Every day | ORAL | Status: DC
  Administered 2022-07-20 – 2022-07-24 (×5): 10 mg via ORAL
  Filled 2022-07-19 (×5): qty 1

## 2022-07-19 MED ORDER — SODIUM CHLORIDE 0.9 % IV SOLN
2.0000 g | INTRAVENOUS | Status: AC
  Administered 2022-07-20 – 2022-07-23 (×4): 2 g via INTRAVENOUS
  Filled 2022-07-19 (×2): qty 20
  Filled 2022-07-19: qty 2
  Filled 2022-07-19: qty 20

## 2022-07-19 MED ORDER — LEVOTHYROXINE SODIUM 75 MCG PO TABS
75.0000 ug | ORAL_TABLET | Freq: Every day | ORAL | Status: DC
  Administered 2022-07-20 – 2022-07-24 (×5): 75 ug via ORAL
  Filled 2022-07-19: qty 1
  Filled 2022-07-19 (×3): qty 3
  Filled 2022-07-19 (×2): qty 1
  Filled 2022-07-19: qty 3
  Filled 2022-07-19 (×2): qty 1
  Filled 2022-07-19: qty 3

## 2022-07-19 MED ORDER — SODIUM CHLORIDE 0.9 % IV BOLUS
500.0000 mL | Freq: Once | INTRAVENOUS | Status: AC
  Administered 2022-07-19: 500 mL via INTRAVENOUS

## 2022-07-19 MED ORDER — BUSPIRONE HCL 10 MG PO TABS
10.0000 mg | ORAL_TABLET | Freq: Two times a day (BID) | ORAL | Status: DC
  Administered 2022-07-19 – 2022-07-24 (×10): 10 mg via ORAL
  Filled 2022-07-19 (×11): qty 1

## 2022-07-19 MED ORDER — LACTULOSE 10 GM/15ML PO SOLN
10.0000 g | Freq: Every day | ORAL | Status: DC
  Administered 2022-07-19 – 2022-07-23 (×4): 10 g via ORAL
  Filled 2022-07-19 (×6): qty 30

## 2022-07-19 MED ORDER — SODIUM CHLORIDE 0.9 % IV SOLN
500.0000 mg | Freq: Once | INTRAVENOUS | Status: AC
  Administered 2022-07-19: 500 mg via INTRAVENOUS
  Filled 2022-07-19: qty 5

## 2022-07-19 MED ORDER — SODIUM CHLORIDE 0.9 % IV SOLN
1.0000 g | Freq: Once | INTRAVENOUS | Status: AC
  Administered 2022-07-19: 1 g via INTRAVENOUS
  Filled 2022-07-19: qty 10

## 2022-07-19 MED ORDER — SODIUM CHLORIDE 0.9 % IV SOLN
500.0000 mg | INTRAVENOUS | Status: AC
  Administered 2022-07-20 – 2022-07-23 (×4): 500 mg via INTRAVENOUS
  Filled 2022-07-19 (×3): qty 5
  Filled 2022-07-19: qty 500

## 2022-07-19 NOTE — H&P (Signed)
History and Physical    Patient: Cassidy [name] [id number] DOB: Apr 29, 1940 DOA: 07/19/2022 DOS: the patient was seen and examined on 07/19/2022 PCP: Kirk Ruths, MD  Patient coming from: SNF  Chief Complaint:  Chief Complaint  Patient presents with   Weakness   HPI: Cassidy [name] is a 82 y.o. 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  History obtained from patient's sister due to patient's underlying dementia and altered mental status.  Cassidy [name] states that yesterday, she noticed that her sister was not picking up her phone which is unusual.  She went to the SNF and found Cassidy [name] altered and tremulous.  She noted that patient was disoriented to place which is unusual.  Symptoms continued throughout the night.  She is uncertain if the rehab facility has not noted any fever, chills, cough or shortness of breath.  She notes that there have been some medication changes but she is uncertain which.  She notes that Cassidy [name] has had decreased appetite for several months now.  She has noted some bilateral feet swelling.  ED course: On arrival to the ED, patient was normotensive at 136/60 with heart rate of 84.  She was saturating at 92% on room air but subsequently desaturated with exertion and was placed on 2 L of supplemental oxygen.  She was afebrile at 98.7.  Initial workup notable for WBC of 2.9, platelets of 52, glucose of 112, creatinine of 0.81 and GFR above 60.  COVID-19, RSV and influenza PCR negative. Chest x-ray was obtained that demonstrated new right perihilar and left basilar pulmonary opacities concerning for multifocal pneumonia.  CT head was obtained that did not show any acute intracranial abnormalities.  Urinalysis was obtained with small  hemoglobin only.  Patient started on azithromycin and ceftriaxone.  [facility] contacted for admission.  Review of Systems: unable to review all systems due to the inability of the patient to answer questions.  Past Medical History:  Diagnosis Date   Anxiety    Depression    Fatty liver    GERD (gastroesophageal reflux disease)    Hypothyroidism    Obesity    Thyroid disease    Past Surgical History:  Procedure Laterality Date   APPENDECTOMY     DILATATION & CURETTAGE/HYSTEROSCOPY WITH MYOSURE N/A 02/04/2018   Procedure: [place];  Surgeon: Ward, Honor Loh, MD;  Location: [facility] ORS;  Service: Gynecology;  Laterality: N/A;   ESOPHAGOGASTRODUODENOSCOPY N/A 12/15/2019   Procedure: ESOPHAGOGASTRODUODENOSCOPY (EGD);  Surgeon: Virgel Manifold, MD;  Location: [facility] ENDOSCOPY;  Service: Endoscopy;  Laterality: N/A;   ESOPHAGOGASTRODUODENOSCOPY (EGD) WITH PROPOFOL N/A 12/20/2014   Procedure: ESOPHAGOGASTRODUODENOSCOPY (EGD) WITH PROPOFOL;  Surgeon: Manya Silvas, MD;  Location: [facility] ENDOSCOPY;  Service: Endoscopy;  Laterality: N/A;   SAVORY DILATION N/A 12/20/2014   Procedure: SAVORY DILATION;  Surgeon: Manya Silvas, MD;  Location: [facility] ENDOSCOPY;  Service: Endoscopy;  Laterality: N/A;   TONSILLECTOMY     Social History:  reports that she has never smoked. She has never used smokeless tobacco. She reports that she does not drink alcohol and does not use drugs.  Allergies  Allergen Reactions   Sulfa Antibiotics Shortness Of Breath    Family History  Problem Relation Age of Onset   Diabetes Mother    COPD Mother  Kidney disease Mother    Depression Mother    Hypertension Mother    Heart attack Father    Aneurysm Father    Anxiety disorder Sister    Depression Sister    Diabetes Sister    Hypertension Sister    Atrial fibrillation Brother    Spinal muscular atrophy Brother    Breast cancer Sister    Bone cancer Sister    Depression  Sister    Anxiety disorder Sister    Liver cancer Sister    Breast cancer Sister    Colon cancer Sister    Depression Sister    Diabetes Sister    COPD Sister    Depression Sister    Anxiety disorder Sister    Skin cancer Sister    Diabetes Brother    Depression Brother    Skin cancer Maternal Aunt    Diabetes Maternal Aunt    Cervical cancer Paternal Aunt     Prior to Admission medications   Medication Sig Start Date End Date Taking? Authorizing Provider  acetaminophen (TYLENOL) 325 MG tablet Take 650 mg by mouth every 6 (six) hours as needed.    [provider]  azelastine (ASTELIN) 0.1 % nasal spray Place 1 spray into both nostrils 2 (two) times daily. Use in each nostril as directed    [provider]  busPIRone (BUSPAR) 15 MG tablet Take 15 mg by mouth 3 (three) times daily. 09/14/20   [provider]  clonazePAM (KLONOPIN) 0.25 MG disintegrating tablet Take 0.25 mg by mouth 2 (two) times daily. 08/24/20   [provider]  escitalopram (LEXAPRO) 20 MG tablet TAKE ONE TABLET BY MOUTH EVERY DAY 07/26/20   Cassidy Alert, MD  ferrous sulfate 325 (65 FE) MG tablet Take 325 mg by mouth daily with breakfast.    [provider]  fexofenadine (ALLEGRA) 180 MG tablet Take 180 mg by mouth daily.    [provider]  fluticasone (FLONASE) 50 MCG/ACT nasal spray Place 2 sprays into both nostrils daily. 05/18/20   [provider]  furosemide (LASIX) 20 MG tablet Take 20 mg by mouth daily. 12/19/20   [provider]  lactulose (CHRONULAC) 10 GM/15ML solution Take 15 mLs (10 g total) by mouth daily. Decrease from BID due to excessive diarrhea. 11/06/20   Enzo Bi, MD  levothyroxine (SYNTHROID) 50 MCG tablet Take 50 mcg by mouth every morning. 05/24/19   [provider]  omeprazole (PRILOSEC) 40 MG capsule Take 1 capsule (40 mg total) by mouth 2 (two) times daily. 12/15/19 02/20/21  Virgel Manifold, MD  ondansetron  (ZOFRAN) 4 MG tablet Take 4 mg by mouth every 8 (eight) hours as needed. Patient not taking: Reported on 02/20/2021 09/05/20   [provider]  QUEtiapine (SEROQUEL) 100 MG tablet TAKE 1 TABLET BY MOUTH NIGHTLY 05/18/20   Cassidy Alert, MD  spironolactone (ALDACTONE) 100 MG tablet Take 1 tablet (100 mg total) by mouth daily. 02/19/18   Mayo, Pete Pelt, MD  torsemide (DEMADEX) 10 MG tablet Take 10 mg by mouth daily. 02/19/21   [provider]  traZODone (DESYREL) 100 MG tablet Take 100 mg by mouth at bedtime. 10/05/20   [provider]  XIFAXAN 550 MG TABS tablet Take 1 tablet by mouth 2 (two) times daily. 10/21/17   [provider]    Physical Exam: Vitals:   07/19/22 1330 07/19/22 1400 07/19/22 1430 07/19/22 1445  BP: (!) 144/67 (!) 143/69 130/70   Pulse: 81  Resp:      Temp:      TempSrc:      SpO2: 99%   93%  Weight:      Height:       Physical Exam Vitals and nursing note reviewed.  Constitutional:      General: She is not in acute distress.    Appearance: She is normal weight. She is not toxic-appearing.  HENT:     Head: Normocephalic and atraumatic.  Eyes:     Conjunctiva/sclera: Conjunctivae normal.     Pupils: Pupils are equal, round, and reactive to light.  Cardiovascular:     Rate and Rhythm: Normal rate and regular rhythm.     Heart sounds: No murmur heard.    No gallop.  Pulmonary:     Effort: Accessory muscle usage (Mild) present. No tachypnea.     Breath sounds: Rales (In the right basilar region and left mid lung field) present. No decreased breath sounds, wheezing or rhonchi.  Abdominal:     General: Bowel sounds are normal. There is distension.     Palpations: Abdomen is soft.     Tenderness: There is no abdominal tenderness. There is no guarding.  Musculoskeletal:     Right lower leg: No edema.     Left lower leg: No edema.  Skin:    General: Skin is warm and dry.  Neurological:     Mental Status: She is [name].      Comments:  Patient is [name] and oriented to self only. Moving all extremities independently and against gravity Asterixis present  Psychiatric:        Mood and Affect: Mood normal.        Behavior: Behavior normal.    Data Reviewed: CBC with WBC of 2.9, hemoglobin of 12.4, platelets of 52 BMP with sodium of 138, potassium 3.7, bicarb 26, glucose 112, BUN 13, creatinine 0.81, anion gap of 8 and GFR above 60 COVID-19, influenza and RSV PCR negative Urinalysis with small hemoglobin only.  EKG personally reviewed. Motion artifact noted.  Sinus rhythm with rate of 82.  Borderline PR prolongation.  No ST or T wave changes concerning for acute ischemia  CT Head Wo Contrast  Result Date: 07/19/2022 CLINICAL DATA:  Mental status change of unknown cause beginning yesterday. EXAM: CT HEAD WITHOUT CONTRAST TECHNIQUE: Contiguous axial images were obtained from the base of the skull through the vertex without intravenous contrast. RADIATION DOSE REDUCTION: This exam was performed according to the departmental dose-optimization program which includes automated exposure control, adjustment of the mA and/or kV according to patient size and/or use of iterative reconstruction technique. COMPARISON:  03/24/2019 FINDINGS: Brain: Age related atrophy without subjective lobar predominance. Chronic small-vessel ischemic changes of the cerebral hemispheric white matter. No sign of acute infarction, mass lesion, hemorrhage, hydrocephalus or extra-axial collection. Vascular: There is atherosclerotic calcification of the major vessels at the base of the brain. Skull: Negative Sinuses/Orbits: Clear/normal Other: None IMPRESSION: No acute CT finding. Age related atrophy. Chronic small-vessel ischemic changes of the cerebral hemispheric white matter. Electronically Signed   By: Nelson Chimes M.D.   On: 07/19/2022 13:34   DG Chest Port 1 View  Result Date: 07/19/2022 CLINICAL DATA:  Shortness of breath EXAM: PORTABLE CHEST 1  VIEW COMPARISON:  CXR 01/10/21 FINDINGS: No pleural effusion. No pneumothorax. Compared to prior exam there are new right perihilar and left basilar pulmonary opacities, which are worrisome for multifocal infection. Unchanged small right pleural effusion. No radiographically apparent displaced  rib fractures. Visualized upper abdomen is unremarkable. Degenerative changes of the bilateral AC joints. IMPRESSION: 1. Compared to prior exam there are new right perihilar and left basilar pulmonary opacities, which are worrisome for multifocal infection. 2. Unchanged small right pleural effusion. Electronically Signed   By: Marin Roberts M.D.   On: 07/19/2022 13:07    There are no new results to review at this time.  Assessment and Plan:  * Acute encephalopathy Patient presenting with 1 day history of increased disorientation and altered mental status.  Differential includes secondary to community-acquired pneumonia versus hepatic encephalopathy.  Patient is on lactulose and rifaximin at home, however unclear if it has been recently administered.  Patient's abdomen is also distended, so will evaluate for ascites; if evidence of ascites, may need to rule out SBP.  - Management of hepatic encephalopathy and CAP as noted below - Abdominal ultrasound ordered - Soft mechanical diet - SLP/PT/OT  CAP (community acquired pneumonia) Patient presenting with altered mental status and evidence of possible multifocal pneumonia.  Differential includes community-acquired versus aspiration, as patient has a history of dysphagia.  She has been experiencing hypoxia with exertion with slight accessory muscle use on examination noted.  - Continue supplemental oxygen as needed to maintain oxygen saturation above 88% - Wean as tolerated - Continue ceftriaxone and azithromycin - Strep pneumo Legionella antigens pending  Hepatic encephalopathy ([place]) Per chart review, patient should be on lactulose and rifaximin at SNF.  Will  need to verify that she has been receiving this.  - Restart lactulose with titration to 2-3 bowel movements per day - Restart rifaximin  Liver cirrhosis secondary to NASH ([place]) Will hold home Lasix and spironolactone for today as patient appears slightly hypovolemic in the setting of possible underlying infection and poor p.o. intake.  Restart when able  Thrombocytopenia ([place]) Stable at this time.  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  At this time, she is more disoriented than normal.  - Delirium precautions - Continue home anxiolytics   Advance Care Planning:   Code Status: DNR/DNI.  Verified by sister, and healthcare power of attorney.  Consults: None  Family Communication: Patient's sister updated at bedside  Severity of Illness: The appropriate patient status for this patient is OBSERVATION. Observation status is judged to be reasonable and necessary in order to provide the required intensity of service to ensure the patient's safety. The patient's presenting symptoms, physical exam findings, and initial radiographic and laboratory data in the context of their medical condition is felt to place them at decreased risk for further clinical deterioration. Furthermore, it is anticipated that the patient will be medically stable for discharge from the hospital within 2 midnights of admission.   Author: Jose Persia, MD 07/19/2022 3:16 PM  For on call review [email or web address].

## 2022-07-19 NOTE — Assessment & Plan Note (Signed)
At baseline, patient is able to use the phone, is oriented to person and place.  At this time, she is more disoriented than normal.  - Delirium precautions - Continue home anxiolytics

## 2022-07-19 NOTE — ED Notes (Signed)
Pt urinated and tech cleaned up pt.  No other needs expressed at the moment

## 2022-07-19 NOTE — Assessment & Plan Note (Signed)
Will hold home Lasix and spironolactone for today as patient appears slightly hypovolemic in the setting of possible underlying infection and poor p.o. intake.  Restart when able

## 2022-07-19 NOTE — Assessment & Plan Note (Signed)
Patient presenting with altered mental status and evidence of possible multifocal pneumonia.  Differential includes community-acquired versus aspiration, as patient has a history of dysphagia.  She has been experiencing hypoxia with exertion with slight accessory muscle use on examination noted.  - Continue supplemental oxygen as needed to maintain oxygen saturation above 88% - Wean as tolerated - Continue ceftriaxone and azithromycin - Strep pneumo Legionella antigens pending

## 2022-07-19 NOTE — ED Triage Notes (Signed)
Pt from [facility], a hospice pt. Staff called stating since last night her pupils were sluggish and she has increased weakness. EMS received lacking report d/t staff not knowing pt very well. Pt states she normally walks with a walker and does not feel any different than usual. History of falls and dementia. Pt A&Ox3, does not know time. EMS had negative stroke screen. Family was agreeable to pt coming to ED

## 2022-07-19 NOTE — Assessment & Plan Note (Signed)
Stable at this time.  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

## 2022-07-19 NOTE — Assessment & Plan Note (Signed)
Patient presenting with 1 day history of increased disorientation and altered mental status.  Differential includes secondary to community-acquired pneumonia versus hepatic encephalopathy.  Patient is on lactulose and rifaximin at home, however unclear if it has been recently administered.  Patient's abdomen is also distended, so will evaluate for ascites; if evidence of ascites, may need to rule out SBP.  - Management of hepatic encephalopathy and CAP as noted below - Abdominal ultrasound ordered - Soft mechanical diet - SLP/PT/OT

## 2022-07-19 NOTE — Progress Notes (Signed)
  Progress Note  Abdominal ultrasound with evidence of significant ascites.  No prior history of ascites, so will order IR guided paracentesis for evaluation.  Author: Jose Persia, MD 07/19/2022 4:21 PM  For on call review [email or web address].

## 2022-07-19 NOTE — Assessment & Plan Note (Signed)
Per chart review, patient should be on lactulose and rifaximin at SNF.  Will need to verify that she has been receiving this.  - Restart lactulose with titration to 2-3 bowel movements per day - Restart rifaximin

## 2022-07-19 NOTE — ED Notes (Signed)
Pt assisted to bathroom with walker. Pt able to ambulate but had difficulty in sitting - unsure if pt understood or if it was lack of ability. Pt brief was soiled and she was unable to urinate in toilet. When back in bed, pt O2 was 88%, 2L [place] placed and O2 went to 92%. Sister at bedside, states she has had decreased appetite and has not been drinking. States pt is normally continent and has increased confusion. States she was at baseline on Thursday.

## 2022-07-19 NOTE — ED Provider Notes (Signed)
[facility] Provider Note    Event Date/Time   First MD Initiated Contact with Patient 07/19/22 1114     (approximate)   History   Weakness   HPI  Cassidy [name] is a 82 y.o. female with a history of dementia, hepatic encephalopathy, anxiety, depression, and GERD who presents with general weakness and change in mental status.  The patient's sister states that both last night and today the patient appeared disoriented, shaky, and weak.  She is concerned that the patient is not eating and drinking enough and may need fluids.  The patient herself denies any acute complaints.  She states she does not feel dizzy, weak, or short of breath.  She has no acute pain.  I reviewed the past medical records.  The patient is under hospice care.  Her most recent primary care encounter was a home care visit by Dr. Ouida Sills on 2/3 at which time she was being followed for her chronic problems.  She has no recent ED visits or admissions.  Discussing her care with her sister, she states that she may want the patient removed from hospice that she feels that she and the patient are not yet ready for this.  She states that she is concerned if the patient is weak or needs fluids and that these interventions cannot be done under hospice care, and she does not want to watch her sister get weaker and waste away.   Physical Exam   Triage Vital Signs: ED Triage Vitals  Enc Vitals Group     BP 07/19/22 1125 134/65     Pulse Rate 07/19/22 1125 84     Resp 07/19/22 1125 19     Temp 07/19/22 1122 98.7 F (37.1 C)     Temp Source 07/19/22 1122 Oral     SpO2 07/19/22 1120 97 %     Weight 07/19/22 1123 132 lb (59.9 kg)     Height 07/19/22 1123 5\' 2"  (1.575 m)     Head Circumference --      Peak Flow --      Pain Score 07/19/22 1122 0     Pain Loc --      Pain Edu? --      Excl. in [place]? --     Most recent vital signs: Vitals:   07/19/22 1130 07/19/22 1230  BP: 136/60 138/76  Pulse:  84 86  Resp: 16 (!) 21  Temp:    SpO2: 92% 100%     General: Alert, oriented x 2, no distress.  CV:  Good peripheral perfusion.  Resp:  Normal effort.  Lungs CTAB. Abd:  No distention.  Other:  EOMI.  PERRLA.  Motor intact in all extremities.  No facial droop.  Slightly dry mucous membranes.   ED Results / Procedures / Treatments   Labs (all labs ordered are listed, but only abnormal results are displayed) Labs Reviewed  CBC - Abnormal; Notable for the following components:      Result Value   WBC 2.9 (*)    RDW 15.8 (*)    Platelets 52 (*)    All other components within normal limits  BASIC METABOLIC PANEL - Abnormal; Notable for the following components:   Glucose, Bld 112 (*)    Calcium 8.6 (*)    All other components within normal limits  URINALYSIS, ROUTINE W REFLEX MICROSCOPIC - Abnormal; Notable for the following components:   Color, Urine YELLOW (*)    APPearance CLEAR (*)  Hgb urine dipstick SMALL (*)    All other components within normal limits  RESP PANEL BY RT-PCR (RSV, FLU A&B, COVID)  [id number]     EKG  ED ECG REPORT I, Arta Silence, the attending physician, personally viewed and interpreted this ECG.  Date: 07/19/2022 EKG Time: 1122 Rate: 82 Rhythm: normal sinus rhythm QRS Axis: normal Intervals: normal ST/T Wave abnormalities: Nonspecific T wave abnormalities Narrative Interpretation: no evidence of acute ischemia    RADIOLOGY  Chest x-ray: I independently viewed and interpreted the images; there are bilateral opacities with no evidence of edema  CT head: No acute findings  PROCEDURES:  Critical Care performed: No  Procedures   MEDICATIONS ORDERED IN ED: Medications  cefTRIAXone (ROCEPHIN) 1 g in sodium chloride 0.9 % 100 mL IVPB (1 g Intravenous New Bag/Given 07/19/22 1434)  azithromycin (ZITHROMAX) 500 mg in sodium chloride 0.9 % 250 mL IVPB (has no administration in time range)  sodium chloride 0.9 % bolus 500 mL (0 mLs  Intravenous Stopped 07/19/22 1400)     IMPRESSION / MDM / ASSESSMENT AND PLAN / ED COURSE  I reviewed the triage vital signs and the nursing notes.  82 year old female with PMH as noted above and currently under hospice care presents with generalized weakness and 2 episodes in which she was noted to be disoriented and shaky although did not have any witnessed seizure activity.  The patient herself denies any acute complaints at this time.  Her vital signs are normal and the physical exam is unremarkable for acute findings other than dry mucous membranes.  Differential diagnosis includes, but is not limited to, dehydration, electrolyte abnormality, other metabolic disturbance, AKI, viral syndrome, UTI, other infection, less likely primary CNS cause.  There is no evidence of cardiac etiology.  I had a discussion with the sister about the goals of care, and she feels strongly that she would like [place] to work the patient up for reversible causes and give treatment as needed.  I have ordered CT head, chest x-ray, lab workup, and we will give a fluid bolus and reassess.  Patient's presentation is most consistent with acute presentation with potential threat to life or bodily function.  The patient is on the cardiac monitor to evaluate for evidence of arrhythmia and/or significant heart rate changes.   ----------------------------------------- 2:39 PM on 07/19/2022 -----------------------------------------  Lab workup is unremarkable except for low WBC count.  The patient has chronic thrombocytopenia.  Electrolytes are unremarkable.  CT head is negative.  Chest x-ray shows multifocal pneumonia and on further history the patient has been coughing and has been somewhat increasing shortness of breath.  She has had intermittent hypoxia especially when ambulating.  I ordered IV antibiotics.  The sister who is the POA does want the patient treated for pneumonia and I feel that she will need inpatient admission  given her clinical status.  I consulted Dr. Charleen Kirks from the hospitalist service; based on discussion she agrees to admit the patient.   FINAL CLINICAL IMPRESSION(S) / ED DIAGNOSES   Final diagnoses:  Community acquired pneumonia, unspecified laterality  Generalized weakness     Rx / DC Orders   ED Discharge Orders     None        Note:  This document was prepared using Dragon voice recognition software and may include unintentional dictation errors.    Arta Silence, MD 07/19/22 [phone]

## 2022-07-20 DIAGNOSIS — Z515 Encounter for palliative care: Secondary | ICD-10-CM | POA: Diagnosis not present

## 2022-07-20 DIAGNOSIS — E861 Hypovolemia: Secondary | ICD-10-CM | POA: Diagnosis present

## 2022-07-20 DIAGNOSIS — K7682 Hepatic encephalopathy: Secondary | ICD-10-CM | POA: Diagnosis not present

## 2022-07-20 DIAGNOSIS — J189 Pneumonia, unspecified organism: Secondary | ICD-10-CM | POA: Diagnosis not present

## 2022-07-20 DIAGNOSIS — G9340 Encephalopathy, unspecified: Secondary | ICD-10-CM | POA: Diagnosis not present

## 2022-07-20 DIAGNOSIS — Z833 Family history of diabetes mellitus: Secondary | ICD-10-CM | POA: Diagnosis not present

## 2022-07-20 DIAGNOSIS — Z808 Family history of malignant neoplasm of other organs or systems: Secondary | ICD-10-CM | POA: Diagnosis not present

## 2022-07-20 DIAGNOSIS — Z8249 Family history of ischemic heart disease and other diseases of the circulatory system: Secondary | ICD-10-CM | POA: Diagnosis not present

## 2022-07-20 DIAGNOSIS — D696 Thrombocytopenia, unspecified: Secondary | ICD-10-CM | POA: Diagnosis not present

## 2022-07-20 DIAGNOSIS — Z79890 Hormone replacement therapy (postmenopausal): Secondary | ICD-10-CM | POA: Diagnosis not present

## 2022-07-20 DIAGNOSIS — Z800 Family history of malignant neoplasm of digestive organs: Secondary | ICD-10-CM | POA: Diagnosis not present

## 2022-07-20 DIAGNOSIS — F03C0 Unspecified dementia, severe, without behavioral disturbance, psychotic disturbance, mood disturbance, and anxiety: Secondary | ICD-10-CM | POA: Diagnosis present

## 2022-07-20 DIAGNOSIS — Z1152 Encounter for screening for COVID-19: Secondary | ICD-10-CM | POA: Diagnosis not present

## 2022-07-20 DIAGNOSIS — Z841 Family history of disorders of kidney and ureter: Secondary | ICD-10-CM | POA: Diagnosis not present

## 2022-07-20 DIAGNOSIS — E039 Hypothyroidism, unspecified: Secondary | ICD-10-CM | POA: Diagnosis present

## 2022-07-20 DIAGNOSIS — K219 Gastro-esophageal reflux disease without esophagitis: Secondary | ICD-10-CM | POA: Diagnosis present

## 2022-07-20 DIAGNOSIS — R188 Other ascites: Secondary | ICD-10-CM | POA: Diagnosis present

## 2022-07-20 DIAGNOSIS — Z66 Do not resuscitate: Secondary | ICD-10-CM | POA: Diagnosis present

## 2022-07-20 DIAGNOSIS — R1310 Dysphagia, unspecified: Secondary | ICD-10-CM | POA: Diagnosis present

## 2022-07-20 DIAGNOSIS — R0902 Hypoxemia: Secondary | ICD-10-CM | POA: Diagnosis present

## 2022-07-20 DIAGNOSIS — R531 Weakness: Secondary | ICD-10-CM | POA: Diagnosis present

## 2022-07-20 DIAGNOSIS — Z825 Family history of asthma and other chronic lower respiratory diseases: Secondary | ICD-10-CM | POA: Diagnosis not present

## 2022-07-20 DIAGNOSIS — K7460 Unspecified cirrhosis of liver: Secondary | ICD-10-CM | POA: Diagnosis present

## 2022-07-20 DIAGNOSIS — K7581 Nonalcoholic steatohepatitis (NASH): Secondary | ICD-10-CM | POA: Diagnosis not present

## 2022-07-20 DIAGNOSIS — Z803 Family history of malignant neoplasm of breast: Secondary | ICD-10-CM | POA: Diagnosis not present

## 2022-07-20 DIAGNOSIS — Z8049 Family history of malignant neoplasm of other genital organs: Secondary | ICD-10-CM | POA: Diagnosis not present

## 2022-07-20 LAB — COMPREHENSIVE METABOLIC PANEL
ALT: 13 U/L (ref 0–44)
AST: 27 U/L (ref 15–41)
Albumin: 2.8 g/dL — ABNORMAL LOW (ref 3.5–5.0)
Alkaline Phosphatase: 92 U/L (ref 38–126)
Anion gap: 6 (ref 5–15)
BUN: 11 mg/dL (ref 8–23)
CO2: 27 mmol/L (ref 22–32)
Calcium: 8.4 mg/dL — ABNORMAL LOW (ref 8.9–10.3)
Chloride: 109 mmol/L (ref 98–111)
Creatinine, Ser: 0.79 mg/dL (ref 0.44–1.00)
GFR, Estimated: >60 mL/min (ref >60)
Glucose, Bld: 92 mg/dL (ref 70–99)
Potassium: 3.8 mmol/L (ref 3.5–5.1)
Sodium: 142 mmol/L (ref 135–145)
Total Bilirubin: 2.5 mg/dL — ABNORMAL HIGH (ref 0.3–1.2)
Total Protein: 6.3 g/dL — ABNORMAL LOW (ref 6.5–8.1)

## 2022-07-20 LAB — CBC WITH DIFFERENTIAL/PLATELET
Abs Immature Granulocytes: 0.02 10*3/uL (ref 0.00–0.07)
Basophils Absolute: 0 10*3/uL (ref 0.0–0.1)
Basophils Relative: 1 %
Eosinophils Absolute: 0.1 10*3/uL (ref 0.0–0.5)
Eosinophils Relative: 3 %
HCT: 37.7 % (ref 36.0–46.0)
Hemoglobin: 12.4 g/dL (ref 12.0–15.0)
Immature Granulocytes: 1 %
Lymphocytes Relative: 9 %
Lymphs Abs: 0.3 10*3/uL — ABNORMAL LOW (ref 0.7–4.0)
MCH: 31.2 pg (ref 26.0–34.0)
MCHC: 32.9 g/dL (ref 30.0–36.0)
MCV: 95 fL (ref 80.0–100.0)
Monocytes Absolute: 0.3 10*3/uL (ref 0.1–1.0)
Monocytes Relative: 9 %
Neutro Abs: 2.5 10*3/uL (ref 1.7–7.7)
Neutrophils Relative %: 77 %
Platelets: 51 10*3/uL — ABNORMAL LOW (ref 150–400)
RBC: 3.97 MIL/uL (ref 3.87–5.11)
RDW: 15.8 % — ABNORMAL HIGH (ref 11.5–15.5)
WBC: 3.2 10*3/uL — ABNORMAL LOW (ref 4.0–10.5)
nRBC: 0 % (ref 0.0–0.2)

## 2022-07-20 LAB — AMMONIA: Ammonia: 33 umol/L (ref 9–35)

## 2022-07-20 NOTE — Progress Notes (Signed)
Progress Note   Patient: Cassidy [name] [id number] DOB: 1940/08/12 DOA: 07/19/2022     0 DOS: the patient was seen and examined on 07/20/2022   Brief hospital course:  KANDE DILLAHUNT is a 82 y.o. 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  History obtained from patient's sister due to patient's underlying dementia and altered mental status.   [name] states that yesterday, she noticed that her sister was not picking up her phone which is unusual.  She went to the SNF and found Ms. Schmelter altered and tremulous.  She noted that patient was disoriented to place which is unusual.  Symptoms continued throughout the night.  She is uncertain if the rehab facility has not noted any fever, chills, cough or shortness of breath.  She notes that there have been some medication changes but she is uncertain which.  She notes that Ms. Zambrano has had decreased appetite for several months now.  She has noted some bilateral feet swelling.   ED course: On arrival to the ED, patient was normotensive at 136/60 with heart rate of 84.  She was saturating at 92% on room air but subsequently desaturated with exertion and was placed on 2 L of supplemental oxygen.  She was afebrile at 98.7.  Initial workup notable for WBC of 2.9, platelets of 52, glucose of 112, creatinine of 0.81 and GFR above 60.  COVID-19, RSV and influenza PCR negative. Chest x-ray was obtained that demonstrated new right perihilar and left basilar pulmonary opacities concerning for multifocal pneumonia.  CT head was obtained that did not show any acute intracranial abnormalities.  Urinalysis was obtained with small hemoglobin only.  Patient started on azithromycin and ceftriaxone.  TRH contacted for admission.  3/31 : Patient  continues to stay confused.  Ammonia normal.  Ultrasound showed large ascites.  IR for paracentesis.  Management of community-acquired pneumonia continued  Assessment and Plan:  * Acute encephalopathy Patient presenting with 1 day history of increased disorientation and altered mental status.  Differential includes secondary to community-acquired pneumonia versus hepatic encephalopathy.  Patient is on lactulose and rifaximin at home, however unclear if it has been recently administered.  Patient's abdomen is also distended, so will evaluate for ascites; if evidence of ascites, may need to rule out SBP.  - Management of hepatic encephalopathy and CAP as noted below - Abdominal ultrasound -showed large ascites.  IR to do paracentesis.  Order in place. - Soft mechanical diet - SLP/PT/OT  CAP (community acquired pneumonia) Patient presenting with altered mental status and evidence of possible multifocal pneumonia.  Differential includes community-acquired versus aspiration, as patient has a history of dysphagia.  She has been experiencing hypoxia with exertion with slight accessory muscle use on examination noted.  - Continue supplemental oxygen as needed to maintain oxygen saturation above 92% - Wean as tolerated - Continue ceftriaxone and azithromycin - Strep pneumo negative, Legionella antigens pending  Hepatic encephalopathy ([place]) Per chart review, patient should be on lactulose and rifaximin at SNF.  Will need to verify that she has been receiving this.  - Restart lactulose with titration to 2-3 bowel movements per day - Restart rifaximin  Liver cirrhosis secondary to NASH ([place]) Will hold home Lasix and spironolactone, patient appears slightly hypovolemic in the setting of possible underlying infection and poor p.o. intake.  Restart  when able  Thrombocytopenia ([place]) Stable at this time.  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  At this time, she is more disoriented than normal.  - Delirium precautions - Continue home anxiolytics  Subjective: Patient seen and examined this morning.  Was somnolent at the time of my interview.  Vital labs and imaging reviewed.  Ultrasound imaging showing large ascites .  Patient scheduled for paracentesis.  Physical Exam: Vitals:   07/19/22 2054 07/20/22 0425 07/20/22 0929 07/20/22 1056  BP: 120/66 130/65 (!) 130/56   Pulse: 81 66 75   Resp: (!) 22 16 16    Temp: 98 F (36.7 C) 98.1 F (36.7 C) 98.4 F (36.9 C)   TempSrc: Oral Oral    SpO2: 93% 91% 90% (!) 86%  Weight:      Height:       Physical Exam HENT:     Head: Normocephalic and atraumatic.     Nose: Nose normal.  Eyes:     Extraocular Movements: Extraocular movements intact.     Pupils: Pupils are equal, round, and reactive to light.  Cardiovascular:     Rate and Rhythm: Normal rate.  Pulmonary:     Effort: Pulmonary effort is normal.  Abdominal:     General: There is distension.     Palpations: There is no mass.     Hernia: No hernia is present.  Musculoskeletal:        General: Normal range of motion.     Cervical back: Normal range of motion.  Skin:    General: Skin is warm.  Neurological:     General: No focal deficit present.     Data Reviewed:  .lab  Family Communication: None by bedside.   Disposition: Status is: Observation The patient remains OBS appropriate and will d/c before 2 midnights.  Planned Discharge Destination: Skilled nursing facility    Time spent: 34 minutes  Author: Oran Rein, MD 07/20/2022 11:39 AM  For on call review [email or web address].

## 2022-07-21 ENCOUNTER — Inpatient Hospital Stay: Payer: Medicare (Managed Care)

## 2022-07-21 DIAGNOSIS — G9340 Encephalopathy, unspecified: Secondary | ICD-10-CM | POA: Diagnosis not present

## 2022-07-21 LAB — BASIC METABOLIC PANEL
Anion gap: 7 (ref 5–15)
BUN: 10 mg/dL (ref 8–23)
CO2: 24 mmol/L (ref 22–32)
Calcium: 8.2 mg/dL — ABNORMAL LOW (ref 8.9–10.3)
Chloride: 110 mmol/L (ref 98–111)
Creatinine, Ser: 0.7 mg/dL (ref 0.44–1.00)
GFR, Estimated: >60 mL/min (ref >60)
Glucose, Bld: 87 mg/dL (ref 70–99)
Potassium: 3.8 mmol/L (ref 3.5–5.1)
Sodium: 141 mmol/L (ref 135–145)

## 2022-07-21 LAB — CBC
HCT: 39.9 % (ref 36.0–46.0)
Hemoglobin: 13 g/dL (ref 12.0–15.0)
MCH: 31.1 pg (ref 26.0–34.0)
MCHC: 32.6 g/dL (ref 30.0–36.0)
MCV: 95.5 fL (ref 80.0–100.0)
Platelets: 57 10*3/uL — ABNORMAL LOW (ref 150–400)
RBC: 4.18 MIL/uL (ref 3.87–5.11)
RDW: 15.8 % — ABNORMAL HIGH (ref 11.5–15.5)
WBC: 3.4 10*3/uL — ABNORMAL LOW (ref 4.0–10.5)
nRBC: 0 % (ref 0.0–0.2)

## 2022-07-21 LAB — BODY FLUID CELL COUNT WITH DIFFERENTIAL
Eos, Fluid: 0 %
Lymphs, Fluid: 53 %
Monocyte-Macrophage-Serous Fluid: 41 %
Neutrophil Count, Fluid: 6 %
Total Nucleated Cell Count, Fluid: 410 cu mm

## 2022-07-21 LAB — ALBUMIN, PLEURAL OR PERITONEAL FLUID: Albumin, Fluid: <1.5 g/dL

## 2022-07-21 LAB — PATHOLOGIST SMEAR REVIEW

## 2022-07-21 MED ORDER — LIDOCAINE HCL (PF) 1 % IJ SOLN
10.0000 mL | Freq: Once | INTRAMUSCULAR | Status: AC
  Administered 2022-07-21: 10 mL via INTRADERMAL

## 2022-07-21 NOTE — Evaluation (Signed)
Physical Therapy Evaluation Patient Details Name: Cassidy [name] MRN: [id number] DOB: [birth year] Today's Date: 07/21/2022  History of Present Illness  Cassidy [name] is a 82 y.o. 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  History obtained from patient's sister due to patient's underlying dementia and altered mental status.   Clinical Impression  Pt admitted with above diagnosis. Pt currently with functional limitations due to the deficits listed below (see PT Problem List). Pt received upright on BSC agreeable to PT. Pt stands to RW at supervision level dependent on NT for pericare in standing and gown change with PT and NT assisting (~3-4 minutes). Pt SPT with RW to EOB to organize lines/leads and remove 3 in 1 for gait assessment. Pt completes transfer at supervision level with safe RW use. One lines and leads are organized, pt able to stand to RW at supervision level but is reliant on max VC's for hand placement with poor carryover. Pt completes in room distances reporting need to return to sitting due to onset of LE weakness. Pt safely completes ~40' step through pattern overall supervision level with VSS throughout. Anticipate pt fatigued from prolonged standing for pericare. Pt with all needs in reach. PT to recommend additional PT services at facility as pt limited in PLOF ambulation distances due to acute LE weakness from illness.     Recommendations for follow up therapy are one component of a multi-disciplinary discharge planning process, led by the attending physician.  Recommendations may be updated based on patient status, additional functional criteria and insurance authorization.  Follow Up Recommendations Can patient physically be transported by private vehicle:  Yes     Assistance Recommended at Discharge Frequent or constant Supervision/Assistance  Patient can return home with the following  A little help with walking and/or transfers;A little help with bathing/dressing/bathroom;Assistance with cooking/housework;Assist for transportation;Help with stairs or ramp for entrance    Equipment Recommendations None recommended by PT  Recommendations for Other Services       Functional Status Assessment Patient has had a recent decline in their functional status and/or demonstrates limited ability to make significant improvements in function in a reasonable and predictable amount of time     Precautions / Restrictions Precautions Precautions: Fall Restrictions Weight Bearing Restrictions: No      Mobility  Bed Mobility               General bed mobility comments: NT. In sitting pre and post session Patient Response: Cooperative, Flat affect  Transfers Overall transfer level: Needs assistance Equipment used: Rolling walker (2 wheels) Transfers: Sit to/from Stand Sit to Stand: Supervision           General transfer comment: VC's for hand placement    Ambulation/Gait Ambulation/Gait assistance: Supervision Gait Distance (Feet): 32 Feet Assistive device: Rolling walker (2 wheels) Gait Pattern/deviations: Step-through pattern, Decreased step length - right, Decreased step length - left, Trunk flexed       General Gait Details: completes short bout of step through gait with RW.  Stairs            Wheelchair Mobility    Modified Rankin (Stroke Patients Only)       Balance Overall balance assessment: Needs assistance Sitting-balance support: No upper extremity supported, Feet supported Sitting balance-Leahy Scale: Good     Standing balance support:  Bilateral upper extremity supported, Reliant on assistive device for balance Standing balance-Leahy Scale: Fair Standing balance comment: maintains excellent static  standing at RW with light UE support                             Pertinent Vitals/Pain Pain Assessment Pain Assessment: Faces Faces Pain Scale: No hurt    Home Living Family/patient expects to be discharged to:: [place]: [facility] (2 wheels)      Prior Function Prior Level of Function : Needs assist       Physical Assist : Mobility (physical);ADLs (physical) Mobility (physical): Bed mobility;Transfers;Gait ADLs (physical): Grooming;Bathing;Dressing;Toileting;IADLs Mobility Comments: reports mod-I with RW ADLs Comments: Receives assist at her facility     Hand Dominance        Extremity/Trunk Assessment   Upper Extremity Assessment Upper Extremity Assessment: Defer to OT evaluation    Lower Extremity Assessment Lower Extremity Assessment: Generalized weakness    Cervical / Trunk Assessment Cervical / Trunk Assessment: Normal  Communication   Communication: No difficulties  Cognition Arousal/Alertness: Awake/alert Behavior During Therapy: Flat affect Overall Cognitive Status: History of cognitive impairments - at baseline                                 General Comments: A&Ox4. History of dementia at baseline.        General Comments      Exercises Other Exercises Other Exercises: Role of PT in acute setting, d/c recs.   Assessment/Plan    PT Assessment Patient needs continued PT services  PT Problem List Decreased strength;Decreased cognition;Decreased activity tolerance;Decreased safety awareness       PT Treatment Interventions DME instruction;Therapeutic exercise;Gait training;Balance training;Neuromuscular re-education;Functional mobility training;Therapeutic activities;Patient/family education    PT Goals (Current goals can be found in the Care Plan section)  Acute Rehab PT Goals PT Goal Formulation: Patient unable to participate in goal setting     Frequency Min 2X/week     Co-evaluation               AM-PAC PT "6 Clicks" Mobility  Outcome Measure Help needed turning from your back to your side while in a flat bed without using bedrails?: A Little Help needed moving from lying on your back to sitting on the side of a flat bed without using bedrails?: A Little Help needed moving to and from a bed to a chair (including a wheelchair)?: A Little Help needed standing up from a chair using your arms (e.g., wheelchair or bedside chair)?: A Little Help needed to walk in hospital room?: A Little Help needed climbing 3-5 steps with a railing? : A Little 6 Click Score: 18    End of Session Equipment Utilized During Treatment: Gait belt Activity Tolerance: Patient tolerated treatment well Patient left: in chair;with call bell/phone within reach;with chair alarm set Nurse Communication: Mobility status PT Visit Diagnosis: Muscle weakness (generalized) (M62.81)    Time: [id number] PT Time Calculation (min) (ACUTE ONLY): 16 min   Charges:   PT Evaluation $PT Eval Low Complexity: 1 Low        Sri Clegg M. Fairly IV, PT, DPT Physical Therapist- [facility]  07/21/2022, 3:26 PM

## 2022-07-21 NOTE — Progress Notes (Signed)
. Progress Note   Patient: Cassidy [name] [id number] DOB: [birth year] DOA: 07/19/2022     1 DOS: the patient was seen and examined on 07/21/2022   Brief hospital course:  Cassidy [name] is a 82 y.o. 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  History obtained from patient's sister due to patient's underlying dementia and altered mental status.   [name] states that yesterday, she noticed that her sister was not picking up her phone which is unusual.  She went to the SNF and found Cassidy [name] altered and tremulous.  She noted that patient was disoriented to place which is unusual.  Symptoms continued throughout the night.  She is uncertain if the rehab facility has not noted any fever, chills, cough or shortness of breath.  She notes that there have been some medication changes but she is uncertain which.  She notes that Cassidy [name] has had decreased appetite for several months now.  She has noted some bilateral feet swelling.   ED course: On arrival to the ED, patient was normotensive at 136/60 with heart rate of 84.  She was saturating at 92% on room air but subsequently desaturated with exertion and was placed on 2 L of supplemental oxygen.  She was afebrile at 98.7.  Initial workup notable for WBC of 2.9, platelets of 52, glucose of 112, creatinine of 0.81 and GFR above 60.  COVID-19, RSV and influenza PCR negative. Chest x-ray was obtained that demonstrated new right perihilar and left basilar pulmonary opacities concerning for multifocal pneumonia.  CT head was obtained that did not show any acute intracranial abnormalities.  Urinalysis was obtained with small hemoglobin only.  Patient started on azithromycin and ceftriaxone.  TRH contacted for admission.  3/31 : Patient  continues to stay confused.  Ammonia normal.  Ultrasound showed large ascites.  IR for paracentesis.  Management of community-acquired pneumonia continued  1/4 : Patient had improvement in mental status.  Underwent paracentesis.  Pending results at this time.  Continues to improve.  Sister updated about the status wanted to be placed in a different facility.  PT OT eval pending at this time.  Assessment and Plan:  * Acute encephalopathy Patient presenting with 1 day history of increased disorientation and altered mental status.  Differential includes secondary to community-acquired pneumonia versus hepatic encephalopathy.  Patient is on lactulose and rifaximin at home, however unclear if it has been recently administered.  Patient's abdomen is also distended, so will evaluate for ascites; if evidence of ascites, may need to rule out SBP.  - Management of hepatic encephalopathy and CAP as noted below - Abdominal ultrasound -showed large ascites.  IR to do paracentesis.  Order in place. - Soft mechanical diet - SLP/PT/OT  CAP (community acquired pneumonia) Patient presenting with altered mental status and evidence of possible multifocal pneumonia.  Differential includes community-acquired versus aspiration, as patient has a history of dysphagia.  She has been experiencing hypoxia with exertion with slight accessory muscle use on examination noted.  - Continue supplemental oxygen as needed to maintain oxygen saturation above 92% - Wean as tolerated - Continue ceftriaxone and azithromycin - Strep pneumo negative, Legionella antigens pending  Hepatic encephalopathy ([place]) Per chart review, patient should be on lactulose and rifaximin at SNF.  Will need to verify that she has been receiving this.  -  Restart lactulose with titration to 2-3 bowel movements per day - Restart rifaximin  Liver cirrhosis secondary to NASH ([place]) Will hold home Lasix and spironolactone, patient appears slightly  hypovolemic in the setting of possible underlying infection and poor p.o. intake.  Restart when able  Thrombocytopenia ([place]) Stable at this time.  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  At this time, she is more disoriented than normal.  - Delirium precautions - Continue home anxiolytics  Subjective: Patient seen and examined this morning.  Was somnolent at the time of my interview.  Vital labs and imaging reviewed.  Ultrasound imaging showing large ascites .  Patient scheduled for paracentesis.  Physical Exam: Vitals:   07/20/22 2118 07/21/22 0422 07/21/22 0741 07/21/22 0933  BP: 123/64 138/69 (!) 129/55 (!) 121/46  Pulse: 79 81 71 63  Resp: 18 18 20    Temp: 98.4 F (36.9 C) 98.1 F (36.7 C) 98.4 F (36.9 C)   TempSrc: Oral Oral    SpO2: 92% 96% 98% 98%  Weight:      Height:       Physical Exam HENT:     Head: Normocephalic and atraumatic.     Nose: Nose normal.  Eyes:     Extraocular Movements: Extraocular movements intact.     Pupils: Pupils are equal, round, and reactive to light.  Cardiovascular:     Rate and Rhythm: Normal rate.  Pulmonary:     Effort: Pulmonary effort is normal.  Abdominal:     General: There is distension.     Palpations: There is no mass.     Hernia: No hernia is present.  Musculoskeletal:        General: Normal range of motion.     Cervical back: Normal range of motion.  Skin:    General: Skin is warm.  Neurological:     General: No focal deficit present.     Data Reviewed:   Family Communication: None by bedside.   Disposition: Status is: Observation The patient remains OBS appropriate and will d/c before 2 midnights.  Planned Discharge Destination: Skilled nursing facility    Time spent: 34 minutes  Author: Oran Rein, MD 07/21/2022  2:06 PM  For on call review [email or web address].

## 2022-07-21 NOTE — Procedures (Signed)
PROCEDURE SUMMARY:  Successful image-guided paracentesis from the right lower abdomen.  Yielded 600 mL of amber fluid.  No immediate complications.  EBL = trace. Patient tolerated well.   Specimen was sent for labs.  Please see imaging section of Epic for full dictation.   Lura Em PA-C 07/21/2022 11:27 AM

## 2022-07-22 DIAGNOSIS — K7682 Hepatic encephalopathy: Secondary | ICD-10-CM | POA: Diagnosis not present

## 2022-07-22 DIAGNOSIS — G9340 Encephalopathy, unspecified: Secondary | ICD-10-CM

## 2022-07-22 DIAGNOSIS — K7581 Nonalcoholic steatohepatitis (NASH): Secondary | ICD-10-CM | POA: Diagnosis not present

## 2022-07-22 DIAGNOSIS — F0390 Unspecified dementia without behavioral disturbance: Secondary | ICD-10-CM

## 2022-07-22 DIAGNOSIS — J189 Pneumonia, unspecified organism: Secondary | ICD-10-CM

## 2022-07-22 DIAGNOSIS — K7460 Unspecified cirrhosis of liver: Secondary | ICD-10-CM

## 2022-07-22 DIAGNOSIS — D696 Thrombocytopenia, unspecified: Secondary | ICD-10-CM

## 2022-07-22 LAB — LEGIONELLA PNEUMOPHILA SEROGP 1 UR AG: L. pneumophila Serogp 1 Ur Ag: NEGATIVE

## 2022-07-22 MED ORDER — ONDANSETRON HCL 4 MG/2ML IJ SOLN
4.0000 mg | Freq: Four times a day (QID) | INTRAMUSCULAR | Status: DC | PRN
  Administered 2022-07-22: 4 mg via INTRAVENOUS
  Filled 2022-07-22: qty 2

## 2022-07-22 NOTE — Progress Notes (Signed)
.. Progress Note   Patient: Cassidy [name] [id number] DOB: 28-Aug-1940 DOA: 07/19/2022     2 DOS: the patient was seen and examined on 07/22/2022   Brief hospital course:  Cassidy [name] is a 82 y.o. 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  History obtained from patient's sister due to patient's underlying dementia and altered mental status.   Cassidy [name] states that yesterday, she noticed that her sister was not picking up her phone which is unusual.  She went to the SNF and found Cassidy [name] altered and tremulous.  She noted that patient was disoriented to place which is unusual.  Symptoms continued throughout the night.  She is uncertain if the rehab facility has not noted any fever, chills, cough or shortness of breath.  She notes that there have been some medication changes but she is uncertain which.  She notes that Cassidy [name] has had decreased appetite for several months now.  She has noted some bilateral feet swelling.   ED course: On arrival to the ED, patient was normotensive at 136/60 with heart rate of 84.  She was saturating at 92% on room air but subsequently desaturated with exertion and was placed on 2 L of supplemental oxygen.  She was afebrile at 98.7.  Initial workup notable for WBC of 2.9, platelets of 52, glucose of 112, creatinine of 0.81 and GFR above 60.  COVID-19, RSV and influenza PCR negative. Chest x-ray was obtained that demonstrated new right perihilar and left basilar pulmonary opacities concerning for multifocal pneumonia.  CT head was obtained that did not show any acute intracranial abnormalities.  Urinalysis was obtained with small hemoglobin only.  Patient started on azithromycin and ceftriaxone.  TRH contacted for admission.  3/31 : Patient  continues to stay confused.  Ammonia normal.  Ultrasound showed large ascites.  IR for paracentesis.  Management of community-acquired pneumonia continued  1/4 : Patient had improvement in mental status.  Underwent paracentesis.  Pending results at this time.  Continues to improve.  Sister updated about the status wanted to be placed in a different facility.  PT OT eval pending at this time.  2/4 : Patient on track with improvement in mental status.  Was able to tolerate PT.  Social work for placement.  Assessment and Plan:  * Acute encephalopathy - now improved at baseline patient able to walk and little more conversant than her current status.  Likely this will be her new baseline as workup has been unremarkable for any acute findings.  Patient presenting with 1 day history of increased disorientation and altered mental status.  Differential includes secondary to community-acquired pneumonia versus hepatic encephalopathy.  Patient is on lactulose and rifaximin at home, however unclear if it has been recently administered.  Patient's abdomen is also distended, so will evaluate for ascites; if evidence of ascites, may need to rule out SBP.  - Management of hepatic encephalopathy and CAP as noted below - Abdominal ultrasound -showed large ascites.  Underwent paracentesis with no signs of SBP. - Soft mechanical diet - SLP/PT/OT  CAP (community acquired pneumonia) Patient presenting with altered mental status and evidence of possible multifocal pneumonia.  Differential includes community-acquired versus aspiration, as patient has a history of dysphagia.  She has been experiencing hypoxia with exertion with slight accessory muscle use on examination noted.  -  Continue supplemental oxygen as needed to maintain oxygen saturation above 92% - Wean as tolerated - Continue ceftriaxone and azithromycin - Strep pneumo negative, Legionella antigens pending  Hepatic encephalopathy (HCC) -less likely to be  etiology was ammonia is normal. Per chart review, patient should be on lactulose and rifaximin at SNF.  Will need to verify that she has been receiving this.  - Restart lactulose with titration to 2-3 bowel movements per day - Restart rifaximin  Liver cirrhosis secondary to NASH ([place]) Will hold home Lasix and spironolactone, patient appears slightly hypovolemic in the setting of possible underlying infection and poor p.o. intake.  Restart when able  Thrombocytopenia ([place]) Stable at this time.  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  At this time, she is more disoriented than normal.  - Delirium precautions - Continue home anxiolytics  Subjective: Patient seen and examined this morning.  More conversant than yesterday.  No family at bedside.  Vital labs and imaging reviewed.  Patient vitally stable labs within reference range.  Patient being evaluated by social work for different facility placement.  Physical Exam: Vitals:   07/21/22 2049 07/22/22 0459 07/22/22 0724 07/22/22 0900  BP: (!) 122/46 (!) 109/46 (!) 106/44 110/60  Pulse: 73 74 68   Resp: 18 18 16    Temp: 98.2 F (36.8 C) 98.1 F (36.7 C) 97.6 F (36.4 C)   TempSrc: Oral Oral    SpO2: 96% 98%    Weight:      Height:       Physical Exam HENT:     Head: Normocephalic and atraumatic.     Nose: Nose normal.  Eyes:     Extraocular Movements: Extraocular movements intact.     Pupils: Pupils are equal, round, and reactive to light.  Cardiovascular:     Rate and Rhythm: Normal rate.  Pulmonary:     Effort: Pulmonary effort is normal.  Abdominal:     General: There is distension.     Palpations: There is no mass.     Hernia: No hernia is present.  Musculoskeletal:        General: Normal range of motion.     Cervical back: Normal  range of motion.  Skin:    General: Skin is warm.  Neurological:     General: No focal deficit present.     Data Reviewed:   Family Communication: None by bedside.   Disposition: Status is: Observation The patient remains OBS appropriate and will d/c before 2 midnights.  Planned Discharge Destination: Skilled nursing facility    Time spent: 34 minutes  Author: Oran Rein, MD 07/22/2022 1:44 PM  For on call review [email or web address].

## 2022-07-22 NOTE — NC FL2 (Signed)
[place] LEVEL OF CARE FORM     IDENTIFICATION  Patient Name: Cassidy [name] Birthdate: [birth year] Sex: female Admission Date (Current Location): 07/19/2022  [facility] and [place] Number:  Engineering geologist and Address:  [facility], [address] [place], [place] [address]      Provider Number: [id number]  Attending Physician Name and Address:  Oran Rein, MD  Relative Name and Phone Number:       Current Level of Care: Hospital Recommended Level of Care: [place] Prior Approval Number:    Date Approved/Denied:   PASRR Number: [id number]  Discharge Plan: SNF    Current Diagnoses: Patient Active Problem List   Diagnosis Date Noted   CAP (community acquired pneumonia) 07/19/2022   Acute encephalopathy 07/19/2022   Abnormal finding on lung imaging 02/20/2021   Iron deficiency anemia due to chronic blood loss 02/20/2021   Portal vein thrombosis 11/21/2020   GI bleed 11/05/2020   Intractable diarrhea 11/05/2020   Resides in skilled nursing facility 08/24/2020   Acute hypoxemic respiratory failure 12/15/2019   Esophageal obstruction due to food impaction    Stomach irritation    Foreign body in esophagus    MDD (major depressive disorder), recurrent, in full remission 09/08/2019   Insomnia due to mental disorder 09/08/2019   Major neurocognitive disorder due to multiple etiologies 09/08/2019   Uses walker 11/24/2018   Dementia arising in the senium and presenium 11/24/2018   General weakness 05/08/2018   Edema of upper extremity 05/08/2018   Liver cirrhosis secondary to NASH 03/08/2018   Pleural effusion on right 02/13/2018   Protein-calorie malnutrition, severe 02/04/2018   Weight loss 01/19/2017   Severe recurrent major depression without psychotic features 06/19/2016   Hepatic encephalopathy 08/13/2015   Thrombocytopenia 07/23/2015   Amnesia 03/20/2015   Major depression in remission 03/12/2015    Gonalgia 01/05/2015   Arthritis 11/27/2014   Depression, major, recurrent, in partial remission ([place]) 11/27/2014   Encounter for general adult medical examination without abnormal findings 08/25/2014   Chronic LBP 08/15/2014   Complete rotator cuff rupture of left shoulder 05/01/2014   Infraspinatus tenosynovitis 05/01/2014   Other synovitis and tenosynovitis, right shoulder 05/01/2014   Difficulty in walking 11/30/2013   Arthritis, degenerative 10/01/2013   Steatohepatitis 10/01/2013   Orthostasis 10/01/2013   Appendicular ataxia 09/26/2013   Fall 09/26/2013   Osteoporosis with fracture 09/06/2013   Clinical depression 03/25/2012   Adult hypothyroidism 03/25/2012   Esophageal stenosis 07/03/2009   Back ache 03/08/2003   GAD (generalized anxiety disorder) 12/27/2002    Orientation RESPIRATION BLADDER Height & Weight     Self, Place  O2 Incontinent Weight: 132 lb (59.9 kg) Height:  5\' 2"  (157.5 cm)  BEHAVIORAL SYMPTOMS/MOOD NEUROLOGICAL BOWEL NUTRITION STATUS      Incontinent Diet (DYS 3)  AMBULATORY STATUS COMMUNICATION OF NEEDS Skin   Limited Assist Verbally  (Redness to hand ankle and sacrum)                       Personal Care Assistance Level of Assistance  Bathing, Feeding, Dressing Bathing Assistance: Limited assistance Feeding assistance: Limited assistance Dressing Assistance: Limited assistance     Functional Limitations Info  Sight, Hearing, Speech Sight Info: Adequate Hearing Info: Impaired Speech Info: Adequate    SPECIAL CARE FACTORS FREQUENCY  PT (By licensed PT), OT (By licensed OT)     PT Frequency: 5 times a week OT Frequency: 5 times a  week            Contractures Contractures Info: Not present    Additional Factors Info  Code Status, Allergies Code Status Info: DNR Allergies Info: Sulfa Antibiotics           Current Medications (07/22/2022):  This is the current hospital active medication list Current Facility-Administered  Medications  Medication Dose Route Frequency Provider Last Rate Last Admin   azithromycin (ZITHROMAX) 500 mg in sodium chloride 0.9 % 250 mL IVPB  500 mg Intravenous Q24H Jose Persia, MD 250 mL/hr at 07/21/22 1513 Infusion Verify at 07/21/22 1513   busPIRone (BUSPAR) tablet 10 mg  10 mg Oral BID Jose Persia, MD   10 mg at 07/22/22 0933   cefTRIAXone (ROCEPHIN) 2 g in sodium chloride 0.9 % 100 mL IVPB  2 g Intravenous Q24H Jose Persia, MD 200 mL/hr at 07/21/22 1443 Restarted at 07/21/22 1443   clonazePAM (KLONOPIN) disintegrating tablet 0.25 mg  0.25 mg Oral BID PRN Jose Persia, MD       escitalopram (LEXAPRO) tablet 10 mg  10 mg Oral Daily Jose Persia, MD   10 mg at 07/22/22 0932   lactulose (CHRONULAC) 10 GM/15ML solution 10 g  10 g Oral Daily Jose Persia, MD   10 g at 07/21/22 0803   levothyroxine (SYNTHROID) tablet 75 mcg  75 mcg Oral Q0600 Jose Persia, MD   75 mcg at 07/22/22 0630   rifaximin (XIFAXAN) tablet 550 mg  550 mg Oral BID Jose Persia, MD   550 mg at 07/22/22 [id number]     Discharge Medications: Please see discharge summary for a list of discharge medications.  Relevant Imaging Results:  Relevant Lab Results:   Additional Information [id number]  Gerilyn Pilgrim, LCSW

## 2022-07-22 NOTE — Progress Notes (Signed)
Responding to IV team consult for a new IV. Pt is confused and no routine IV meds ordered- Spoke to RN. Patient has 2 antibiotics due at 1400. Patient had removed her IV's. Will restart her IV access at 12 noon instead of right now due to confusion and pulling at IV's.

## 2022-07-23 DIAGNOSIS — J189 Pneumonia, unspecified organism: Secondary | ICD-10-CM | POA: Diagnosis not present

## 2022-07-23 DIAGNOSIS — D696 Thrombocytopenia, unspecified: Secondary | ICD-10-CM | POA: Diagnosis not present

## 2022-07-23 DIAGNOSIS — G9340 Encephalopathy, unspecified: Secondary | ICD-10-CM | POA: Diagnosis not present

## 2022-07-23 DIAGNOSIS — K7581 Nonalcoholic steatohepatitis (NASH): Secondary | ICD-10-CM | POA: Diagnosis not present

## 2022-07-23 NOTE — Progress Notes (Signed)
. Progress Note   Patient: Cassidy [name] [id number] DOB: September 25, 1940 DOA: 07/19/2022     3 DOS: the patient was seen and examined on 07/23/2022   Brief hospital course:  Cassidy [name] is a 82 y.o. 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  History obtained from patient's sister due to patient's underlying dementia and altered mental status.   Cassidy [name] states that yesterday, she noticed that her sister was not picking up her phone which is unusual.  She went to the SNF and found Cassidy [name] altered and tremulous.  She noted that patient was disoriented to place which is unusual.  Symptoms continued throughout the night.  She is uncertain if the rehab facility has not noted any fever, chills, cough or shortness of breath.  She notes that there have been some medication changes but she is uncertain which.  She notes that Cassidy [name] has had decreased appetite for several months now.  She has noted some bilateral feet swelling.   ED course: On arrival to the ED, patient was normotensive at 136/60 with heart rate of 84.  She was saturating at 92% on room air but subsequently desaturated with exertion and was placed on 2 L of supplemental oxygen.  She was afebrile at 98.7.  Initial workup notable for WBC of 2.9, platelets of 52, glucose of 112, creatinine of 0.81 and GFR above 60.  COVID-19, RSV and influenza PCR negative. Chest x-ray was obtained that demonstrated new right perihilar and left basilar pulmonary opacities concerning for multifocal pneumonia.  CT head was obtained that did not show any acute intracranial abnormalities.  Urinalysis was obtained with small hemoglobin only.  Patient started on azithromycin and ceftriaxone.  TRH contacted for admission.  3/31 : Patient  continues to stay confused.  Ammonia normal.  Ultrasound showed large ascites.  IR for paracentesis.  Management of community-acquired pneumonia continued  1/4 : Patient had improvement in mental status.  Underwent paracentesis.  Pending results at this time.  Continues to improve.  Sister updated about the status wanted to be placed in a different facility.  PT OT eval pending at this time.  4/2 : Paracentesis with no SBP.  Patient symptomatically feeling better.  Sister reports the patient has baseline ambulation now.  Sister did not want the patient to go to the prior nursing facility.  Social work able to get bed for [facility] [employer].  Patient planned for discharge tomorrow to [facility].   Assessment and Plan:  * Acute encephalopathy -resolved Patient presenting with 1 day history of increased disorientation and altered mental status.  Differential includes secondary to community-acquired pneumonia versus hepatic encephalopathy.  Patient is on lactulose and rifaximin at home, however unclear if it has been recently administered.  Patient's abdomen is also distended, so will evaluate for ascites; if evidence of ascites, may need to rule out SBP.  - Management of hepatic encephalopathy and CAP as noted below - Abdominal ultrasound -showed large ascites.  IR to do paracentesis.  Order in place. - Soft mechanical diet - SLP/PT/OT  CAP (community acquired pneumonia) Patient presenting with altered mental status and evidence of possible multifocal pneumonia.  Differential includes community-acquired versus aspiration, as patient has a history of dysphagia.  She has been experiencing hypoxia with exertion with slight accessory muscle use on examination noted.  -  Continue supplemental oxygen as needed to maintain oxygen saturation above 92% - Wean as tolerated - Continue ceftriaxone and azithromycin - Strep pneumo negative, Legionella antigens pending  Hepatic encephalopathy ([place]) Per chart  review, patient should be on lactulose and rifaximin at SNF.  Will need to verify that she has been receiving this.  - Restart lactulose with titration to 2-3 bowel movements per day - Restart rifaximin  Liver cirrhosis secondary to NASH ([place]) Will hold home Lasix and spironolactone, patient appears slightly hypovolemic in the setting of possible underlying infection and poor p.o. intake.  Restart when able  Thrombocytopenia ([place]) Stable at this time.  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  At this time, she is more disoriented than normal.  - Delirium precautions - Continue home anxiolytics  Subjective: Patient seen and examined this morning.  Keeps saying it is okay it is okay.  No overnight event.  Patient vitally stable.  No labs to review today.  Patient pending placement at [facility].  Physical Exam: Vitals:   07/22/22 1615 07/22/22 2133 07/23/22 0424 07/23/22 0822  BP: (!) 105/50 (!) 112/44 (!) 120/44 (!) 112/49  Pulse: 77 78 78 76  Resp: 20 16 18 16   Temp: 99.2 F (37.3 C) 98.4 F (36.9 C) 98.7 F (37.1 C) 98.5 F (36.9 C)  TempSrc:  Oral Oral Oral  SpO2: 95% 92% (!) 89% 92%  Weight:      Height:       Physical Exam HENT:     Head: Normocephalic and atraumatic.     Nose: Nose normal.  Eyes:     Extraocular Movements: Extraocular movements intact.     Pupils: Pupils are equal, round, and reactive to light.  Cardiovascular:     Rate and Rhythm: Normal rate.  Pulmonary:     Effort: Pulmonary effort is normal.  Abdominal:     General: Bowel sounds are normal. There is no distension.     Palpations: Abdomen is soft. There is no mass.     Hernia: No hernia is present.  Musculoskeletal:        General: Normal range of motion.     Cervical back: Normal range of motion.   Skin:    General: Skin is warm.  Neurological:     General: No focal deficit present.     Data Reviewed:   Family Communication: None by bedside.   Disposition: Status is: Observation The patient remains OBS appropriate and will d/c before 2 midnights.  Planned Discharge Destination: Skilled nursing facility    Time spent: 34 minutes  Author: Oran Rein, MD 07/23/2022 4:30 PM  For on call review [email or web address].

## 2022-07-23 NOTE — Progress Notes (Signed)
Mobility Specialist - Progress Note   07/23/22 1000  Mobility  Activity Ambulated with assistance in hallway  Level of Assistance Standby assist, set-up cues, supervision of patient - no hands on  Assistive Device Front wheel walker  Distance Ambulated (ft) 200 ft  Activity Response Tolerated well  $Mobility charge 1 Mobility     Pt lying in bed upon arrival, utilizing RA. Pt agreeable to activity. Completed bed mobility modI. STS and ambulation with supervision. Pt completed 1 lap around NS voicing fatigue only at the end of session. No LOB, SOB, or dizziness. VC to maintain forward gaze and focus as pt sometimes gets distracted. VC for avoiding obstacle collision. Pt repeating "okay, alright" throughout session stating that she doesn't know what's causing her to keep repeating that. Pt returned to bed with alarm set, needs in reach.    Kathee Delton Mobility Specialist 07/23/22, 10:56 AM

## 2022-07-23 NOTE — TOC Progression Note (Signed)
Transition of Care [facility]) - Progression Note    Patient Details  Name: Cassidy [name] MRN: [id number] Date of Birth: 02/13/1941  Transition of [facility]) CM/SW Contact  Gerilyn Pilgrim, LCSW Phone Number: 07/23/2022, 11:30 AM  Clinical Narrative:   LVM with sister to discuss placement.          Expected Discharge Plan and Services                                               Social Determinants of Health (SDOH) Interventions SDOH Screenings   Alcohol Screen: Low Risk  (03/03/2017)  Tobacco Use: Low Risk  (07/19/2022)    Readmission Risk Interventions     No data to display

## 2022-07-23 NOTE — Care Management Important Message (Signed)
Important Message  Patient Details  Name: Cassidy [name] MRN: [id number] Date of Birth: 13-Nov-1940   Medicare Important Message Given:  Yes     Dannette Barbara 07/23/2022, 11:07 AM

## 2022-07-23 NOTE — Progress Notes (Signed)
Mobility Specialist - Progress Note   07/23/22 1439  Mobility  Activity Ambulated with assistance in hallway  Level of Assistance Standby assist, set-up cues, supervision of patient - no hands on  Assistive Device Front wheel walker  Distance Ambulated (ft) 160 ft  Activity Response Tolerated well  Mobility Referral Yes  $Mobility charge 1 Mobility   Pt standing in hallway requesting walk on arrival. Pt ambulates in hallway SBA with no LOB noted during session. Pt needs VC for obstacle navigation and safety awareness throughout. Pt desats to SPO2 87 and needs seated rest break. Pt able to ambulate back to room after short rest and SPO2 rises above 90. RN notified. Pt left in bed with needs in reach and bed alarm set.   Gretchen Short  Mobility Specialist  07/23/22 2:42 PM

## 2022-07-23 NOTE — TOC Progression Note (Signed)
Transition of Care [facility]) - Progression Note    Patient Details  Name: Cassidy [name] MRN: [id number] Date of Birth: [birth year]  Transition of Care [facility]) CM/SW Contact  Gerilyn Pilgrim, LCSW Phone Number: 07/23/2022, 2:12 PM  Clinical Narrative:   CSW spoke with [facility]. Tiffany at [employer] they will handle patients insurance in house. CSW notified team and will notify family.          Expected Discharge Plan and Services                                               Social Determinants of Health (SDOH) Interventions SDOH Screenings   Alcohol Screen: Low Risk  (03/03/2017)  Tobacco Use: Low Risk  (07/19/2022)    Readmission Risk Interventions     No data to display

## 2022-07-23 NOTE — Progress Notes (Signed)
Physical Therapy Treatment Patient Details Name: Cassidy [name] MRN: [id number] DOB: Apr 24, 1940 Today's Date: 07/23/2022   History of Present Illness Cassidy [name] is a 82 y.o. 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  History obtained from patient's sister due to patient's underlying dementia and altered mental status.    PT Comments    Pt received upright in bed agreeable to PT services. Pt exiting bed mod-I standing to RW and ambulating at supervision level. Pt completes ~100' of gait with step through gait and safe sue of RW with turns and hand placements with transfers. Pt does demonstrate mildly increased WOB towards end of gait bout thus pt returning to supine in bed. Anticipate pt is back to baseline with her mobility. Pt does endorse she remains weaker in general due to acute illness. Will continue PT POC and maintain current f/u recs.     Recommendations for follow up therapy are one component of a multi-disciplinary discharge planning process, led by the attending physician.  Recommendations may be updated based on patient status, additional functional criteria and insurance authorization.  Follow Up Recommendations  Can patient physically be transported by private vehicle: Yes    Assistance Recommended at Discharge Frequent or constant Supervision/Assistance  Patient can return home with the following A little help with walking and/or transfers;A little help with bathing/dressing/bathroom;Assistance with cooking/housework;Assist for transportation;Help with stairs or ramp for entrance   Equipment Recommendations  None recommended by PT    Recommendations for Other Services       Precautions / Restrictions Precautions Precautions:  Fall Restrictions Weight Bearing Restrictions: No     Mobility  Bed Mobility Overal bed mobility: Modified Independent               Patient Response: Cooperative, Flat affect  Transfers Overall transfer level: Needs assistance Equipment used: Rolling walker (2 wheels) Transfers: Sit to/from Stand Sit to Stand: Modified independent (Device/Increase time)                Ambulation/Gait Ambulation/Gait assistance: Supervision Gait Distance (Feet): 100 Feet Assistive device: Rolling walker (2 wheels) Gait Pattern/deviations: Step-through pattern, Decreased step length - right, Decreased step length - left, Trunk flexed           Stairs             Wheelchair Mobility    Modified Rankin (Stroke Patients Only)       Balance Overall balance assessment: Needs assistance Sitting-balance support: No upper extremity supported, Feet supported Sitting balance-Leahy Scale: Good     Standing balance support: Bilateral upper extremity supported, Reliant on assistive device for balance Standing balance-Leahy Scale: Fair                              Cognition Arousal/Alertness: Awake/alert Behavior During Therapy: Flat affect Overall Cognitive Status: History of cognitive impairments - at baseline                                 General Comments: follows commands. Repeating "okay, alright" throughout session        Exercises      General Comments        Pertinent Vitals/Pain Pain Assessment Pain Assessment:  Faces Faces Pain Scale: No hurt    Home Living                          Prior Function            PT Goals (current goals can now be found in the care plan section) Acute Rehab PT Goals PT Goal Formulation: Patient unable to participate in goal setting    Frequency    Min 2X/week      PT Plan Current plan remains appropriate    Co-evaluation              AM-PAC PT "6 Clicks"  Mobility   Outcome Measure  Help needed turning from your back to your side while in a flat bed without using bedrails?: A Little Help needed moving from lying on your back to sitting on the side of a flat bed without using bedrails?: A Little Help needed moving to and from a bed to a chair (including a wheelchair)?: A Little Help needed standing up from a chair using your arms (e.g., wheelchair or bedside chair)?: A Little Help needed to walk in hospital room?: A Little Help needed climbing 3-5 steps with a railing? : A Little 6 Click Score: 18    End of Session Equipment Utilized During Treatment: Gait belt Activity Tolerance: Patient tolerated treatment well Patient left: in bed;with call bell/phone within reach;with bed alarm set Nurse Communication: Mobility status PT Visit Diagnosis: Muscle weakness (generalized) (M62.81)     Time: [id number] PT Time Calculation (min) (ACUTE ONLY): 9 min  Charges:  $Therapeutic Exercise: 8-22 mins                    Salem Caster. Fairly IV, PT, DPT Physical Therapist- [facility]  07/23/2022, 11:45 AM

## 2022-07-24 DIAGNOSIS — G9340 Encephalopathy, unspecified: Secondary | ICD-10-CM | POA: Diagnosis not present

## 2022-07-24 LAB — CBC
HCT: 36.1 % (ref 36.0–46.0)
Hemoglobin: 12.1 g/dL (ref 12.0–15.0)
MCH: 31.8 pg (ref 26.0–34.0)
MCHC: 33.5 g/dL (ref 30.0–36.0)
MCV: 95 fL (ref 80.0–100.0)
Platelets: 47 10*3/uL — ABNORMAL LOW (ref 150–400)
RBC: 3.8 MIL/uL — ABNORMAL LOW (ref 3.87–5.11)
RDW: 15.7 % — ABNORMAL HIGH (ref 11.5–15.5)
WBC: 3.2 10*3/uL — ABNORMAL LOW (ref 4.0–10.5)
nRBC: 0 % (ref 0.0–0.2)

## 2022-07-24 LAB — COMPREHENSIVE METABOLIC PANEL
ALT: 12 U/L (ref 0–44)
AST: 27 U/L (ref 15–41)
Albumin: 2.5 g/dL — ABNORMAL LOW (ref 3.5–5.0)
Alkaline Phosphatase: 90 U/L (ref 38–126)
Anion gap: 5 (ref 5–15)
BUN: 15 mg/dL (ref 8–23)
CO2: 22 mmol/L (ref 22–32)
Calcium: 8.3 mg/dL — ABNORMAL LOW (ref 8.9–10.3)
Chloride: 111 mmol/L (ref 98–111)
Creatinine, Ser: 0.67 mg/dL (ref 0.44–1.00)
GFR, Estimated: >60 mL/min (ref >60)
Glucose, Bld: 82 mg/dL (ref 70–99)
Potassium: 4.1 mmol/L (ref 3.5–5.1)
Sodium: 138 mmol/L (ref 135–145)
Total Bilirubin: 1.5 mg/dL — ABNORMAL HIGH (ref 0.3–1.2)
Total Protein: 5.9 g/dL — ABNORMAL LOW (ref 6.5–8.1)

## 2022-07-24 LAB — BODY FLUID CULTURE W GRAM STAIN: Culture: NO GROWTH

## 2022-07-24 MED ORDER — CLONAZEPAM 0.25 MG PO TBDP: 0.2500 mg | ORAL_TABLET | Freq: Two times a day (BID) | ORAL | 0 refills | Status: AC

## 2022-07-24 NOTE — Plan of Care (Signed)
Patient is discharged to [facility] in stable condition via EMS transport.

## 2022-07-24 NOTE — TOC Transition Note (Signed)
Transition of Care [facility]) - CM/SW Discharge Note   Patient Details  Name: Cassidy [name] MRN: [id number] Date of Birth: 1940/12/13  [facility] CM/SW Contact:  Gerilyn Pilgrim, LCSW Phone Number: 07/24/2022, 10:16 AM   Clinical Narrative:   Pt has orders to discharge to [facility]. RN given number for report. DNR on chart. Crystal at [employer] notified and sister notified. Medical necessity printed to unit. Pt is fourth in line for [facility]. CSW signing off.            Patient Goals and CMS Choice      Discharge Placement                         Discharge Plan and Services Additional resources added to the After Visit Summary for                                       Social Determinants of Health (SDOH) Interventions SDOH Screenings   Alcohol Screen: Low Risk  (03/03/2017)  Tobacco Use: Low Risk  (07/19/2022)     Readmission Risk Interventions     No data to display

## 2022-07-24 NOTE — Discharge Summary (Signed)
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  Her daughter went to see her at a SNF and found altered and tremulous.  In the emergency room blood pressure stable.  92% on room air.  Chest x-ray with right perihilar and left basilar pulmonary opacities concerning for pneumonia but likely fluid.  CT head was normal.  Ammonia was normal.  Admitted with hepatic encephalopathy.  Hepatic encephalopathy in a patient with underlying chronic liver cirrhosis: Presented with disorientation and altered mental status. Patient was treated with lactulose, resume rifaximin from home. Treated with antibiotics. Clinically improved.  Paracentesis with transudate.  No evidence of infection.  Blood cultures negative. Patient will continue to take lactulose to have 2-3 bowel movements a day.  Rifaximin to continue.  Community-acquired pneumonia: Suspected bilateral lower lobe pneumonia.  This could also be fluid overload.  Clinically improved.  Now on room air.  Cultures negative.  Completed 5 days of antibiotic therapy.  Chronic medical issues including Chronic thrombocytopenia, stable Dementia, stable.  She uses Klonopin as needed.  On Lexapro and BuSpar.  Can use trazodone as she was using.  Unsure whether she was  using Seroquel, not used in the hospital.  Will discontinue doses.  Medically stabilized to transfer to SNF.    Discharge Diagnoses:  Principal Problem:   Acute encephalopathy Active Problems:   CAP (community acquired pneumonia)   Hepatic encephalopathy   Liver cirrhosis secondary to NASH   Thrombocytopenia   Dementia arising in the senium and presenium    Discharge Instructions  Discharge Instructions     Diet - low sodium heart healthy   Complete by: As directed    Increase activity slowly   Complete by: As directed       Allergies as of 07/24/2022       Reactions   Sulfa Antibiotics Shortness Of Breath        Medication List     STOP taking these medications    OLANZapine 2.5 MG tablet Commonly known as: ZYPREXA   QUEtiapine 50 MG tablet Commonly known as: SEROQUEL       TAKE these medications    acetaminophen 325 MG tablet Commonly known as: TYLENOL Take 650 mg by mouth every 6 (six) hours as needed.   busPIRone 10 MG tablet Commonly known as: BUSPAR Take 10 mg by mouth 2 (two) times daily.   clonazePAM 0.25 MG disintegrating tablet Commonly known as: KLONOPIN Take 1 tablet (0.25 mg total) by mouth 2 (two) times daily for 5 days.   escitalopram 10 MG tablet Commonly known as: LEXAPRO Take 10 mg by mouth daily.   ferrous sulfate 325 (65 FE) MG tablet Take 325 mg by mouth daily with breakfast.   fexofenadine 180 MG tablet Commonly known as: ALLEGRA Take 180 mg by mouth daily.   fluticasone 50 MCG/ACT nasal spray Commonly known as: FLONASE Place 2 sprays into both nostrils daily.  furosemide 20 MG tablet Commonly known as: LASIX Take 20 mg by mouth daily.   lactulose 10 GM/15ML solution Commonly known as: CHRONULAC Take 15 mLs (10 g total) by mouth daily. Decrease from BID due to excessive diarrhea.   levothyroxine 75 MCG tablet Commonly known as: SYNTHROID Take 75 mcg by mouth daily.   omeprazole 40 MG capsule Commonly  known as: PriLOSEC Take 1 capsule (40 mg total) by mouth 2 (two) times daily.   ondansetron 4 MG disintegrating tablet Commonly known as: ZOFRAN-ODT Take 4 mg by mouth every 8 (eight) hours as needed for nausea or vomiting.   spironolactone 25 MG tablet Commonly known as: ALDACTONE Take 25 mg by mouth daily.   traZODone 50 MG tablet Commonly known as: DESYREL Take 50 mg by mouth at bedtime.   Xifaxan 550 MG Tabs tablet Generic drug: rifaximin Take 1 tablet by mouth 2 (two) times daily.        Allergies  Allergen Reactions   Sulfa Antibiotics Shortness Of Breath    Consultations: None, IR. 600 mL transudate removed.   Procedures/Studies: US Paracentesis  Result Date: 07/21/2022 INDICATION: Ascites EXAM: ULTRASOUND GUIDED right lower quadrant therapeutic and diagnostic PARACENTESIS MEDICATIONS: 5 cc 1% lidocaine COMPLICATIONS: None immediate. PROCEDURE: Informed written consent was obtained from the patient after a discussion of the risks, benefits and alternatives to treatment. A timeout was performed prior to the initiation of the procedure. Initial ultrasound scanning demonstrates a moderate amount of ascites within the right lower abdominal quadrant. The right lower abdomen was prepped and draped in the usual sterile fashion. 1% lidocaine was used for local anesthesia. Following this, a 19 gauge, 7-cm, Yueh catheter was introduced. An ultrasound image was saved for documentation purposes. The paracentesis was performed. The catheter was removed and a dressing was applied. The patient tolerated the procedure well without immediate post procedural complication. FINDINGS: A total of approximately 600 mL of amber fluid was removed. Samples were sent to the laboratory as requested by the clinical team. IMPRESSION: Successful ultrasound-guided paracentesis yielding 600 mL of peritoneal fluid. Read by: Reatha Armour, PA-C Electronically Signed   By: Albin Felling M.D.   On: 07/21/2022  11:38   [place] ASCITES (ABDOMEN LIMITED)  Result Date: 07/19/2022 CLINICAL DATA:  Ascites. EXAM: LIMITED ABDOMEN ULTRASOUND FOR ASCITES TECHNIQUE: Limited ultrasound survey for ascites was performed in all four abdominal quadrants. COMPARISON:  October 30, 2020 FINDINGS: A large amount of ascites is seen within all 4 quadrants of the abdomen (ultrasonographic measurements not provided). Of incidental note is the presence of a left pleural effusion. IMPRESSION: 1. Large amount of ascites. 2. Left pleural effusion. Electronically Signed   By: Virgina Norfolk M.D.   On: 07/19/2022 16:14   CT Head Wo Contrast  Result Date: 07/19/2022 CLINICAL DATA:  Mental status change of unknown cause beginning yesterday. EXAM: CT HEAD WITHOUT CONTRAST TECHNIQUE: Contiguous axial images were obtained from the base of the skull through the vertex without intravenous contrast. RADIATION DOSE REDUCTION: This exam was performed according to the departmental dose-optimization program which includes automated exposure control, adjustment of the mA and/or kV according to patient size and/or use of iterative reconstruction technique. COMPARISON:  03/24/2019 FINDINGS: Brain: Age related atrophy without subjective lobar predominance. Chronic small-vessel ischemic changes of the cerebral hemispheric white matter. No sign of acute infarction, mass lesion, hemorrhage, hydrocephalus or extra-axial collection. Vascular: There is atherosclerotic calcification of the major vessels at the base of the brain. Skull: Negative Sinuses/Orbits: Clear/normal Other:  None IMPRESSION: No acute CT finding. Age related atrophy. Chronic small-vessel ischemic changes of the cerebral hemispheric white matter. Electronically Signed   By: Nelson Chimes M.D.   On: 07/19/2022 13:34   DG Chest Port 1 View  Result Date: 07/19/2022 CLINICAL DATA:  Shortness of breath EXAM: PORTABLE CHEST 1 VIEW COMPARISON:  CXR 01/10/21 FINDINGS: No pleural effusion. No pneumothorax.  Compared to prior exam there are new right perihilar and left basilar pulmonary opacities, which are worrisome for multifocal infection. Unchanged small right pleural effusion. No radiographically apparent displaced rib fractures. Visualized upper abdomen is unremarkable. Degenerative changes of the bilateral AC joints. IMPRESSION: 1. Compared to prior exam there are new right perihilar and left basilar pulmonary opacities, which are worrisome for multifocal infection. 2. Unchanged small right pleural effusion. Electronically Signed   By: Marin Roberts M.D.   On: 07/19/2022 13:07   (Echo, Carotid, EGD, Colonoscopy, ERCP)    Subjective: Patient seen in the morning rounds.  Denies any complaints.  She is alert awake and mostly oriented.  She knows she is going to [facility] today.  No family at the bedside.   Discharge Exam: Vitals:   07/23/22 1631 07/23/22 2115  BP: (!) 103/48 (!) 111/54  Pulse: 74 82  Resp: 16 18  Temp: 98.9 F (37.2 C) 98.6 F (37 C)  SpO2: 96% 91%   Vitals:   07/23/22 0424 07/23/22 0822 07/23/22 1631 07/23/22 2115  BP: (!) 120/44 (!) 112/49 (!) 103/48 (!) 111/54  Pulse: 78 76 74 82  Resp: 18 16 16 18   Temp: 98.7 F (37.1 C) 98.5 F (36.9 C) 98.9 F (37.2 C) 98.6 F (37 C)  TempSrc: Oral Oral  Oral  SpO2: (!) 89% 92% 96% 91%  Weight:      Height:        General: Pt is alert, awake, not in acute distress Looks comfortable.  Able to keep up conversation.  Mostly alert oriented.  There is no asterixis or tremors. Cardiovascular: RRR, S1/S2 +, no rubs, no gallops Respiratory: CTA bilaterally, no wheezing, no rhonchi Abdominal: Soft, NT, ND, bowel sounds + Extremities: no edema, no cyanosis    The results of significant diagnostics from this hospitalization (including imaging, microbiology, ancillary and laboratory) are listed below for reference.     Microbiology: Recent Results (from the past 240 hour(s))  Resp panel by RT-PCR (RSV, Flu A&B, Covid)  Anterior Nasal Swab     Status: None   Collection Time: 07/19/22 11:54 AM   Specimen: Anterior Nasal Swab  Result Value Ref Range Status   SARS Coronavirus 2 by RT PCR NEGATIVE NEGATIVE Final    Comment: (NOTE) SARS-CoV-2 target nucleic acids are NOT DETECTED.  The SARS-CoV-2 RNA is generally detectable in upper respiratory specimens during the acute phase of infection. The lowest concentration of SARS-CoV-2 viral copies this assay can detect is 138 copies/mL. A negative result does not preclude SARS-Cov-2 infection and should not be used as the sole basis for treatment or other patient management decisions. A negative result may occur with  improper specimen collection/handling, submission of specimen other than nasopharyngeal swab, presence of viral mutation(s) within the areas targeted by this assay, and inadequate number of viral copies(<138 copies/mL). A negative result must be combined with clinical observations, patient history, and epidemiological information. The expected result is Negative.  Fact Sheet for Patients:  EntrepreneurPulse.com.au  Fact Sheet for Healthcare Providers:  IncredibleEmployment.be  This test is no t yet approved or cleared  by the [place] and  has been authorized for detection and/or diagnosis of SARS-CoV-2 by FDA under an Emergency Use Authorization (EUA). This EUA will remain  in effect (meaning this test can be used) for the duration of the COVID-19 declaration under Section 564(b)(1) of the Act, 21 U.S.C.section 360bbb-3(b)(1), unless the authorization is terminated  or revoked sooner.       Influenza A by PCR NEGATIVE NEGATIVE Final   Influenza B by PCR NEGATIVE NEGATIVE Final    Comment: (NOTE) The Xpert Xpress SARS-CoV-2/FLU/RSV plus assay is intended as an aid in the diagnosis of influenza from Nasopharyngeal swab specimens and should not be used as a sole basis for treatment. Nasal washings  and aspirates are unacceptable for Xpert Xpress SARS-CoV-2/FLU/RSV testing.  Fact Sheet for Patients: EntrepreneurPulse.com.au  Fact Sheet for Healthcare Providers: IncredibleEmployment.be  This test is not yet approved or cleared by the [place] FDA and has been authorized for detection and/or diagnosis of SARS-CoV-2 by FDA under an Emergency Use Authorization (EUA). This EUA will remain in effect (meaning this test can be used) for the duration of the COVID-19 declaration under Section 564(b)(1) of the Act, 21 U.S.C. section 360bbb-3(b)(1), unless the authorization is terminated or revoked.     Resp Syncytial Virus by PCR NEGATIVE NEGATIVE Final    Comment: (NOTE) Fact Sheet for Patients: EntrepreneurPulse.com.au  Fact Sheet for Healthcare Providers: IncredibleEmployment.be  This test is not yet approved or cleared by the [place] FDA and has been authorized for detection and/or diagnosis of SARS-CoV-2 by FDA under an Emergency Use Authorization (EUA). This EUA will remain in effect (meaning this test can be used) for the duration of the COVID-19 declaration under Section 564(b)(1) of the Act, 21 U.S.C. section 360bbb-3(b)(1), unless the authorization is terminated or revoked.  Performed at [facility], [address] [place], [place] [address]   Body fluid culture w Gram Stain     Status: None (Preliminary result)   Collection Time: 07/21/22  9:50 AM   Specimen: PATH Cytology Peritoneal fluid  Result Value Ref Range Status   Specimen Description   Final    PERITONEAL Performed at [facility], [address] [place], [place] [address]    Special Requests   Final    NONE Performed at [facility], [address] [place], [place] [address]    Gram Stain   Final    FEW WBC PRESENT, PREDOMINANTLY MONONUCLEAR NO ORGANISMS SEEN    Culture   Final    NO  GROWTH 2 DAYS Performed at [facility], [place] [address], [place], [place] [address]    Report Status PENDING  Incomplete     Labs: BNP (last 3 results) No results for input(s): "BNP" in the last 8760 hours. Basic Metabolic Panel: Recent Labs  Lab 07/19/22 1154 07/20/22 0607 07/21/22 0509 07/24/22 0502  NA 138 142 141 138  K 3.7 3.8 3.8 4.1  CL 104 109 110 111  CO2 26 27 24 22   GLUCOSE 112* 92 87 82  BUN 13 11 10 15   CREATININE 0.81 0.79 0.70 0.67  CALCIUM 8.6* 8.4* 8.2* 8.3*   Liver Function Tests: Recent Labs  Lab 07/19/22 1153 07/20/22 0607 07/24/22 0502  AST 33 27 27  ALT 16 13 12   ALKPHOS 120 92 90  BILITOT 2.9* 2.5* 1.5*  PROT 7.0 6.3* 5.9*  ALBUMIN 3.1* 2.8* 2.5*   No results for input(s): "LIPASE", "AMYLASE" in the last  168 hours. Recent Labs  Lab 07/20/22 0854  AMMONIA 33   CBC: Recent Labs  Lab 07/19/22 1154 07/20/22 0607 07/21/22 0509 07/24/22 0502  WBC 2.9* 3.2* 3.4* 3.2*  NEUTROABS  --  2.5  --   --   HGB 12.4 12.4 13.0 12.1  HCT 37.4 37.7 39.9 36.1  MCV 94.2 95.0 95.5 95.0  PLT 52* 51* 57* 47*   Cardiac Enzymes: No results for input(s): "CKTOTAL", "CKMB", "CKMBINDEX", "TROPONINI" in the last 168 hours. BNP: Invalid input(s): "POCBNP" CBG: No results for input(s): "GLUCAP" in the last 168 hours. D-Dimer No results for input(s): "DDIMER" in the last 72 hours. Hgb A1c No results for input(s): "HGBA1C" in the last 72 hours. Lipid Profile No results for input(s): "CHOL", "HDL", "LDLCALC", "TRIG", "CHOLHDL", "LDLDIRECT" in the last 72 hours. Thyroid function studies No results for input(s): "TSH", "T4TOTAL", "T3FREE", "THYROIDAB" in the last 72 hours.  Invalid input(s): "FREET3" Anemia work up No results for input(s): "VITAMINB12", "FOLATE", "FERRITIN", "TIBC", "IRON", "RETICCTPCT" in the last 72 hours. Urinalysis    Component Value Date/Time   COLORURINE YELLOW (A) 07/19/2022 1416   APPEARANCEUR CLEAR (A) 07/19/2022 1416    APPEARANCEUR Clear 08/21/2013 1035   LABSPEC 1.006 07/19/2022 1416   LABSPEC 1.021 08/21/2013 1035   PHURINE 7.0 07/19/2022 1416   GLUCOSEU NEGATIVE 07/19/2022 1416   GLUCOSEU Negative 08/21/2013 1035   HGBUR SMALL (A) 07/19/2022 1416   BILIRUBINUR NEGATIVE 07/19/2022 1416   BILIRUBINUR Negative 08/21/2013 1035   KETONESUR NEGATIVE 07/19/2022 1416   PROTEINUR NEGATIVE 07/19/2022 1416   NITRITE NEGATIVE 07/19/2022 1416   LEUKOCYTESUR NEGATIVE 07/19/2022 1416   LEUKOCYTESUR Negative 08/21/2013 1035   Sepsis Labs Recent Labs  Lab 07/19/22 1154 07/20/22 0607 07/21/22 0509 07/24/22 0502  WBC 2.9* 3.2* 3.4* 3.2*   Microbiology Recent Results (from the past 240 hour(s))  Resp panel by RT-PCR (RSV, Flu A&B, Covid) Anterior Nasal Swab     Status: None   Collection Time: 07/19/22 11:54 AM   Specimen: Anterior Nasal Swab  Result Value Ref Range Status   SARS Coronavirus 2 by RT PCR NEGATIVE NEGATIVE Final    Comment: (NOTE) SARS-CoV-2 target nucleic acids are NOT DETECTED.  The SARS-CoV-2 RNA is generally detectable in upper respiratory specimens during the acute phase of infection. The lowest concentration of SARS-CoV-2 viral copies this assay can detect is 138 copies/mL. A negative result does not preclude SARS-Cov-2 infection and should not be used as the sole basis for treatment or other patient management decisions. A negative result may occur with  improper specimen collection/handling, submission of specimen other than nasopharyngeal swab, presence of viral mutation(s) within the areas targeted by this assay, and inadequate number of viral copies(<138 copies/mL). A negative result must be combined with clinical observations, patient history, and epidemiological information. The expected result is Negative.  Fact Sheet for Patients:  EntrepreneurPulse.com.au  Fact Sheet for Healthcare Providers:  IncredibleEmployment.be  This test is  no t yet approved or cleared by the [place] FDA and  has been authorized for detection and/or diagnosis of SARS-CoV-2 by FDA under an Emergency Use Authorization (EUA). This EUA will remain  in effect (meaning this test can be used) for the duration of the COVID-19 declaration under Section 564(b)(1) of the Act, 21 U.S.C.section 360bbb-3(b)(1), unless the authorization is terminated  or revoked sooner.       Influenza A by PCR NEGATIVE NEGATIVE Final   Influenza B by PCR NEGATIVE NEGATIVE Final    Comment: (  NOTE) The Xpert Xpress SARS-CoV-2/FLU/RSV plus assay is intended as an aid in the diagnosis of influenza from Nasopharyngeal swab specimens and should not be used as a sole basis for treatment. Nasal washings and aspirates are unacceptable for Xpert Xpress SARS-CoV-2/FLU/RSV testing.  Fact Sheet for Patients: EntrepreneurPulse.com.au  Fact Sheet for Healthcare Providers: IncredibleEmployment.be  This test is not yet approved or cleared by the [place] FDA and has been authorized for detection and/or diagnosis of SARS-CoV-2 by FDA under an Emergency Use Authorization (EUA). This EUA will remain in effect (meaning this test can be used) for the duration of the COVID-19 declaration under Section 564(b)(1) of the Act, 21 U.S.C. section 360bbb-3(b)(1), unless the authorization is terminated or revoked.     Resp Syncytial Virus by PCR NEGATIVE NEGATIVE Final    Comment: (NOTE) Fact Sheet for Patients: EntrepreneurPulse.com.au  Fact Sheet for Healthcare Providers: IncredibleEmployment.be  This test is not yet approved or cleared by the [place] FDA and has been authorized for detection and/or diagnosis of SARS-CoV-2 by FDA under an Emergency Use Authorization (EUA). This EUA will remain in effect (meaning this test can be used) for the duration of the COVID-19 declaration under Section  564(b)(1) of the Act, 21 U.S.C. section 360bbb-3(b)(1), unless the authorization is terminated or revoked.  Performed at [facility], [address] [place], [place] [address]   Body fluid culture w Gram Stain     Status: None (Preliminary result)   Collection Time: 07/21/22  9:50 AM   Specimen: PATH Cytology Peritoneal fluid  Result Value Ref Range Status   Specimen Description   Final    PERITONEAL Performed at [facility], [address], [place], [place] [address]    Special Requests   Final    NONE Performed at [facility], [address] [place], [place] [address]    Gram Stain   Final    FEW WBC PRESENT, PREDOMINANTLY MONONUCLEAR NO ORGANISMS SEEN    Culture   Final    NO GROWTH 2 DAYS Performed at [facility], [place] [address] [place], [place] [address]    Report Status PENDING  Incomplete     Time coordinating discharge: 35 minutes  SIGNED:   Barb Merino, MD  [facility] 07/24/2022, 8:07 AM

## 2022-07-31 ENCOUNTER — Telehealth: Payer: Self-pay | Admitting: Oncology

## 2022-07-31 NOTE — Telephone Encounter (Signed)
Marylene Land at [facility] where patient is a resident called to schedule a follow-up with Dr. Cathie Hoops. Patient was due to be seen Feb 2023 but was a no show and was seen recently in the ED.   Please advise on scheduling. Thanks!  Marylene Land at [facility] [phone]

## 2022-08-22 ENCOUNTER — Telehealth: Payer: Self-pay | Admitting: Gastroenterology

## 2022-08-22 NOTE — Telephone Encounter (Signed)
Cassidy [name] is on patient DPR. Called and left a message for call back

## 2022-08-22 NOTE — Telephone Encounter (Signed)
Pt sister Misty Stanley Harrision left message in ref to pts appointment on 11/19/2022 she fills that she needs to be seen earlier  because of fluid build up and she  is concerned please return call [phone]

## 2022-08-22 NOTE — Telephone Encounter (Signed)
Misty Stanley Harrision pt sister missed called would like a call back

## 2022-08-25 ENCOUNTER — Inpatient Hospital Stay: Payer: Medicare (Managed Care) | Attending: Oncology | Admitting: Oncology

## 2022-08-25 ENCOUNTER — Encounter: Payer: Self-pay | Admitting: Oncology

## 2022-08-25 ENCOUNTER — Inpatient Hospital Stay: Payer: Medicare (Managed Care)

## 2022-08-25 VITALS — BP 111/58 | HR 68 | Temp 97.6°F | Resp 19 | Wt 124.3 lb

## 2022-08-25 DIAGNOSIS — Z800 Family history of malignant neoplasm of digestive organs: Secondary | ICD-10-CM | POA: Diagnosis not present

## 2022-08-25 DIAGNOSIS — R197 Diarrhea, unspecified: Secondary | ICD-10-CM | POA: Insufficient documentation

## 2022-08-25 DIAGNOSIS — F0283 Dementia in other diseases classified elsewhere, unspecified severity, with mood disturbance: Secondary | ICD-10-CM | POA: Diagnosis not present

## 2022-08-25 DIAGNOSIS — F3342 Major depressive disorder, recurrent, in full remission: Secondary | ICD-10-CM | POA: Diagnosis not present

## 2022-08-25 DIAGNOSIS — I81 Portal vein thrombosis: Secondary | ICD-10-CM | POA: Insufficient documentation

## 2022-08-25 DIAGNOSIS — D500 Iron deficiency anemia secondary to blood loss (chronic): Secondary | ICD-10-CM | POA: Diagnosis not present

## 2022-08-25 DIAGNOSIS — Z803 Family history of malignant neoplasm of breast: Secondary | ICD-10-CM | POA: Insufficient documentation

## 2022-08-25 DIAGNOSIS — K7581 Nonalcoholic steatohepatitis (NASH): Secondary | ICD-10-CM | POA: Insufficient documentation

## 2022-08-25 DIAGNOSIS — Z79899 Other long term (current) drug therapy: Secondary | ICD-10-CM | POA: Diagnosis not present

## 2022-08-25 DIAGNOSIS — E039 Hypothyroidism, unspecified: Secondary | ICD-10-CM | POA: Insufficient documentation

## 2022-08-25 DIAGNOSIS — T18128A Food in esophagus causing other injury, initial encounter: Secondary | ICD-10-CM | POA: Diagnosis not present

## 2022-08-25 DIAGNOSIS — D696 Thrombocytopenia, unspecified: Secondary | ICD-10-CM | POA: Insufficient documentation

## 2022-08-25 DIAGNOSIS — K219 Gastro-esophageal reflux disease without esophagitis: Secondary | ICD-10-CM | POA: Diagnosis not present

## 2022-08-25 DIAGNOSIS — K7460 Unspecified cirrhosis of liver: Secondary | ICD-10-CM | POA: Insufficient documentation

## 2022-08-25 LAB — HEPATITIS PANEL, ACUTE
HCV Ab: NONREACTIVE
Hep A IgM: NONREACTIVE
Hep B C IgM: NONREACTIVE
Hepatitis B Surface Ag: NONREACTIVE

## 2022-08-25 LAB — CBC WITH DIFFERENTIAL/PLATELET
Abs Immature Granulocytes: 0.01 10*3/uL (ref 0.00–0.07)
Basophils Absolute: 0 10*3/uL (ref 0.0–0.1)
Basophils Relative: 0 %
Eosinophils Absolute: 0.1 10*3/uL (ref 0.0–0.5)
Eosinophils Relative: 3 %
HCT: 39.4 % (ref 36.0–46.0)
Hemoglobin: 13 g/dL (ref 12.0–15.0)
Immature Granulocytes: 0 %
Lymphocytes Relative: 11 %
Lymphs Abs: 0.3 10*3/uL — ABNORMAL LOW (ref 0.7–4.0)
MCH: 31.4 pg (ref 26.0–34.0)
MCHC: 33 g/dL (ref 30.0–36.0)
MCV: 95.2 fL (ref 80.0–100.0)
Monocytes Absolute: 0.2 10*3/uL (ref 0.1–1.0)
Monocytes Relative: 10 %
Neutro Abs: 1.8 10*3/uL (ref 1.7–7.7)
Neutrophils Relative %: 76 %
Platelets: 61 10*3/uL — ABNORMAL LOW (ref 150–400)
RBC: 4.14 MIL/uL (ref 3.87–5.11)
RDW: 15.3 % (ref 11.5–15.5)
Smear Review: NORMAL
WBC: 2.4 10*3/uL — ABNORMAL LOW (ref 4.0–10.5)
nRBC: 0 % (ref 0.0–0.2)

## 2022-08-25 LAB — PROTIME-INR
INR: 1.6 — ABNORMAL HIGH (ref 0.8–1.2)
Prothrombin Time: 18.5 seconds — ABNORMAL HIGH (ref 11.4–15.2)

## 2022-08-25 LAB — VITAMIN B12: Vitamin B-12: 560 pg/mL (ref 180–914)

## 2022-08-25 LAB — IMMATURE PLATELET FRACTION: Immature Platelet Fraction: 1.7 % (ref 1.2–8.6)

## 2022-08-25 LAB — APTT: aPTT: 40 seconds — ABNORMAL HIGH (ref 24–36)

## 2022-08-25 LAB — LACTATE DEHYDROGENASE: LDH: 176 U/L (ref 98–192)

## 2022-08-25 LAB — FOLATE: Folate: 8.4 ng/mL (ref >5.9)

## 2022-08-25 NOTE — Telephone Encounter (Signed)
Move appointment with our PA tina

## 2022-08-25 NOTE — Progress Notes (Signed)
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 Today's platelet count is 61,000 Recommend continue observation.  Liver cirrhosis secondary to NASH [facility]) Recommend patient to establish care with GI Check hepatitis panel, PTT, PT, AFP. I will defer imaging surveillance for HCC to GI.   Orders Placed This Encounter  Procedures   Vitamin B12    Standing Status:   Future    Number of Occurrences:   1    Standing Expiration Date:   08/25/2023   Folate    Standing Status:   Future    Number of Occurrences:   1    Standing Expiration Date:   08/25/2023   CBC with Differential/Platelet    Standing Status:   Future    Number of Occurrences:   1    Standing Expiration Date:   08/25/2023   Flow cytometry panel-leukemia/lymphoma work-up    Standing Status:   Future    Number of Occurrences:   1    Standing Expiration Date:   08/25/2023   Lactate dehydrogenase    Standing Status:   Future    Number of Occurrences:   1    Standing Expiration Date:   08/25/2023   Immature Platelet Fraction    Standing Status:   Future    Number of Occurrences:   1    Standing Expiration Date:   08/25/2023   Protime-INR    Standing Status:   Future    Number of Occurrences:   1    Standing Expiration Date:   08/25/2023   APTT    Standing Status:   Future    Number of Occurrences:   1    Standing Expiration Date:   08/25/2023   Hepatitis panel, acute    Standing Status:   Future    Number of Occurrences:   1    Standing Expiration Date:   08/25/2023   AFP tumor marker    Standing Status:   Future    Number of Occurrences:   1    Standing Expiration Date:   08/25/2023    Technologist smear review    Standing Status:   Future    Standing Expiration Date:   08/25/2023    Order Specific Question:   Clinical information:    Answer:   thrombocytopenia   CMP (Cancer Center only)    Standing Status:   Future    Standing Expiration Date:   08/25/2023   CBC with Differential (Cancer Center Only)    Standing Status:   Future    Standing Expiration Date:   08/25/2023   Follow-up in 6 months All questions were answered. The patient knows to call the clinic with any problems, questions or concerns.  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 Patient was last seen by me in 2019 and then she lost follow-up.  Patient was accompanied by sister Denny Levy who is patient's power of attorney. Patient has history of Elita Boone cirrhosis, splenomegaly and chronic thrombocytopenia. 10/30/2020, patient had ultrasound abdomen right upper quadrant for HCC surveillance. No focal liver lesion was identified.  Interval development of  nonocclusive portal vein thrombosis.  11/05/2020 -11/06/2020 she was recently admitted due to diarrhea.  C. difficile was not obtained as no additional loose stool after presentation.  There is report of melena, no signs of active bleeding during the presentation.  Hemoglobin was stable in the 9s  Patient was recommended to establish care with me today for further evaluation.  Patient has dementia and is a poor historian.  My history was provided by Misty Stanley.   INTERVAL HISTORY DEVINA [name] is a 82 y.o. female who has above history reviewed by me today presents to establish care for thrombocytopenia.  Patient was seen last by me in 2022, she lost follow-up afterwards.  Patient has dementia, history was obtained primarily from sister Misty Stanley and review of medical records. Patient was recently admitted to the hospital in  April 2024 due to hepatic encephalopathy, community-acquired pneumonia. She will reestablish care with GI Dr. Allegra Lai next week. No reported history of black stool or blood in the stool.  Patient lives in a facility. Patient has a history of portal vein thrombosis, poor candidate for anticoagulation due to risk of GI bleeding.  Review of systems- Review of Systems  Unable to perform ROS: Dementia  Constitutional:  Positive for malaise/fatigue.  HENT:  Negative for sore throat.   Gastrointestinal:  Negative for abdominal pain and blood in stool.       Dark stool  Endo/Heme/Allergies:  Bruises/bleeds easily.    Allergies  Allergen Reactions   Sulfa Antibiotics Shortness Of Breath    Patient Active Problem List   Diagnosis Date Noted   Thrombocytopenia (HCC) 07/23/2015    Priority: High   Liver cirrhosis secondary to NASH (HCC) 03/08/2018    Priority: Medium    CAP (community acquired pneumonia) 07/19/2022   Acute encephalopathy 07/19/2022   Abnormal finding on lung imaging 02/20/2021   Iron deficiency anemia due to chronic blood loss 02/20/2021   Portal vein thrombosis 11/21/2020   GI bleed 11/05/2020   Intractable diarrhea 11/05/2020   Resides in skilled nursing facility 08/24/2020   Acute hypoxemic respiratory failure (HCC) 12/15/2019   Esophageal obstruction due to food impaction    Stomach irritation    Foreign body in esophagus    MDD (major depressive disorder), recurrent, in full remission (HCC) 09/08/2019   Insomnia due to mental disorder 09/08/2019   Major neurocognitive disorder due to multiple etiologies (HCC) 09/08/2019   Uses walker 11/24/2018   Dementia arising in the senium and presenium (HCC) 11/24/2018   General weakness 05/08/2018   Edema of upper extremity 05/08/2018   Pleural effusion on right 02/13/2018   Protein-calorie malnutrition, severe 02/04/2018   Weight loss 01/19/2017   Severe recurrent major depression without psychotic features (HCC)  06/19/2016   Hepatic encephalopathy (HCC) 08/13/2015   Amnesia 03/20/2015   Major depression in remission (HCC) 03/12/2015   Gonalgia 01/05/2015   Arthritis 11/27/2014   Depression, major, recurrent, in partial remission (HCC) 11/27/2014   Encounter for general adult medical examination without abnormal findings 08/25/2014   Chronic LBP 08/15/2014   Complete rotator cuff rupture of left shoulder 05/01/2014   Infraspinatus tenosynovitis 05/01/2014   Other synovitis and tenosynovitis, right shoulder 05/01/2014   Difficulty in walking 11/30/2013   Arthritis, degenerative 10/01/2013   Steatohepatitis 10/01/2013   Orthostasis 10/01/2013   Appendicular ataxia 09/26/2013   Fall 09/26/2013   Osteoporosis with fracture 09/06/2013   Clinical depression 03/25/2012   Adult hypothyroidism 03/25/2012   Esophageal stenosis 07/03/2009   Back ache 03/08/2003  GAD (generalized anxiety disorder) 12/27/2002     Past Medical History:  Diagnosis Date   Anxiety    Depression    Fatty liver    GERD (gastroesophageal reflux disease)    Hypothyroidism    Obesity    Thyroid disease      Past Surgical History:  Procedure Laterality Date   APPENDECTOMY     DILATATION & CURETTAGE/HYSTEROSCOPY WITH MYOSURE N/A 02/04/2018   Procedure: DILATATION & CURETTAGE/HYSTEROSCOPY WITH MYOSURE;  Surgeon: Ward, Elenora Fender, MD;  Location: [facility] ORS;  Service: Gynecology;  Laterality: N/A;   ESOPHAGOGASTRODUODENOSCOPY N/A 12/15/2019   Procedure: ESOPHAGOGASTRODUODENOSCOPY (EGD);  Surgeon: Pasty Spillers, MD;  Location: [facility] ENDOSCOPY;  Service: Endoscopy;  Laterality: N/A;   ESOPHAGOGASTRODUODENOSCOPY (EGD) WITH PROPOFOL N/A 12/20/2014   Procedure: ESOPHAGOGASTRODUODENOSCOPY (EGD) WITH PROPOFOL;  Surgeon: Scot Jun, MD;  Location: [facility] ENDOSCOPY;  Service: Endoscopy;  Laterality: N/A;   SAVORY DILATION N/A 12/20/2014   Procedure: SAVORY DILATION;  Surgeon: Scot Jun, MD;  Location: [facility] ENDOSCOPY;   Service: Endoscopy;  Laterality: N/A;   TONSILLECTOMY      Social History   Socioeconomic History   Marital status: Widowed    Spouse name: Not on file   Number of children: 3   Years of education: Not on file   Highest education level: Not on file  Occupational History   Not on file  Tobacco Use   Smoking status: Never   Smokeless tobacco: Never  Vaping Use   Vaping Use: Never used  Substance and Sexual Activity   Alcohol use: No   Drug use: No   Sexual activity: Not Currently    Birth control/protection: Post-menopausal  Other Topics Concern   Not on file  Social History Narrative   Not on file   Social Determinants of Health   Financial Resource Strain: Not on file  Food Insecurity: Not on file  Transportation Needs: Not on file  Physical Activity: Not on file  Stress: Not on file  Social Connections: Not on file  Intimate Partner Violence: Not on file     Family History  Problem Relation Age of Onset   Diabetes Mother    COPD Mother    Kidney disease Mother    Depression Mother    Hypertension Mother    Heart attack Father    Aneurysm Father    Anxiety disorder Sister    Depression Sister    Diabetes Sister    Hypertension Sister    Atrial fibrillation Brother    Spinal muscular atrophy Brother    Breast cancer Sister    Bone cancer Sister    Depression Sister    Anxiety disorder Sister    Liver cancer Sister    Breast cancer Sister    Colon cancer Sister    Depression Sister    Diabetes Sister    COPD Sister    Depression Sister    Anxiety disorder Sister    Skin cancer Sister    Diabetes Brother    Depression Brother    Skin cancer Maternal Aunt    Diabetes Maternal Aunt    Cervical cancer Paternal Aunt      Current Outpatient Medications:    acetaminophen (TYLENOL) 325 MG tablet, Take 650 mg by mouth every 6 (six) hours as needed., Disp: , Rfl:    busPIRone (BUSPAR) 10 MG tablet, Take 10 mg by mouth 2 (two) times daily., Disp: ,  Rfl:    clonazePAM (KLONOPIN) 0.25 MG disintegrating  tablet, Take 1 tablet (0.25 mg total) by mouth 2 (two) times daily for 5 days., Disp: 10 tablet, Rfl: 0   escitalopram (LEXAPRO) 10 MG tablet, Take 10 mg by mouth daily., Disp: , Rfl:    ferrous sulfate 325 (65 FE) MG tablet, Take 325 mg by mouth daily with breakfast., Disp: , Rfl:    fexofenadine (ALLEGRA) 180 MG tablet, Take 180 mg by mouth daily., Disp: , Rfl:    fluticasone (FLONASE) 50 MCG/ACT nasal spray, Place 2 sprays into both nostrils daily., Disp: , Rfl:    furosemide (LASIX) 20 MG tablet, Take 20 mg by mouth daily., Disp: , Rfl:    lactulose (CHRONULAC) 10 GM/15ML solution, Take 15 mLs (10 g total) by mouth daily. Decrease from BID due to excessive diarrhea., Disp: , Rfl:    levothyroxine (SYNTHROID) 75 MCG tablet, Take 75 mcg by mouth daily., Disp: , Rfl:    omeprazole (PRILOSEC) 40 MG capsule, Take 1 capsule (40 mg total) by mouth 2 (two) times daily., Disp: 90 capsule, Rfl: 0   ondansetron (ZOFRAN-ODT) 4 MG disintegrating tablet, Take 4 mg by mouth every 8 (eight) hours as needed for nausea or vomiting., Disp: , Rfl:    spironolactone (ALDACTONE) 25 MG tablet, Take 25 mg by mouth daily., Disp: , Rfl:    traZODone (DESYREL) 50 MG tablet, Take 50 mg by mouth at bedtime., Disp: , Rfl:    XIFAXAN 550 MG TABS tablet, Take 1 tablet by mouth 2 (two) times daily., Disp: , Rfl:    Physical exam:  Vitals:   08/25/22 1430  BP: (!) 111/58  Pulse: 68  Resp: 19  Temp: 97.6 F (36.4 C)  SpO2: 95%  Weight: 124 lb 4.8 oz (56.4 kg)   Physical Exam Constitutional:      General: She is not in acute distress.    Appearance: She is not diaphoretic.     Comments: Frail elderly female sits in the wheelchair  HENT:     Head: Normocephalic and atraumatic.  Eyes:     General: No scleral icterus.    Pupils: Pupils are equal, round, and reactive to light.  Cardiovascular:     Rate and Rhythm: Normal rate.     Heart sounds: No murmur  heard. Pulmonary:     Effort: Pulmonary effort is normal.     Breath sounds: Normal breath sounds.  Abdominal:     General: There is no distension.     Palpations: Abdomen is soft.     Tenderness: There is no abdominal tenderness.  Musculoskeletal:     Cervical back: Normal range of motion and neck supple.  Skin:    General: Skin is dry.  Neurological:     Mental Status: She is alert. Mental status is at baseline.     Motor: No abnormal muscle tone.     Comments: Patient answers simple yes and no questions.  Psychiatric:        Mood and Affect: Mood and affect normal.           Latest Ref Rng & Units 07/24/2022    5:02 AM  CMP  Glucose 70 - 99 mg/dL 82   BUN 8 - 23 mg/dL 15   Creatinine 1.61 - 1.00 mg/dL 0.96   Sodium 045 - 409 mmol/L 138   Potassium 3.5 - 5.1 mmol/L 4.1   Chloride 98 - 111 mmol/L 111   CO2 22 - 32 mmol/L 22   Calcium 8.9 - 10.3 mg/dL 8.3  Total Protein 6.5 - 8.1 g/dL 5.9   Total Bilirubin 0.3 - 1.2 mg/dL 1.5   Alkaline Phos 38 - 126 U/L 90   AST 15 - 41 U/L 27   ALT 0 - 44 U/L 12       Latest Ref Rng & Units 08/25/2022    3:07 PM  CBC  WBC 4.0 - 10.5 K/uL 2.4   Hemoglobin 12.0 - 15.0 g/dL 16.1   Hematocrit 09.6 - 46.0 % 39.4   Platelets 150 - 400 K/uL 61     No results found.

## 2022-08-25 NOTE — Assessment & Plan Note (Signed)
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 Today's platelet count is 61,000 Recommend continue observation.

## 2022-08-25 NOTE — Assessment & Plan Note (Addendum)
Recommend patient to establish care with GI Check hepatitis panel, PTT, PT, AFP. I will defer imaging surveillance for HCC to GI.

## 2022-08-27 LAB — COMP PANEL: LEUKEMIA/LYMPHOMA

## 2022-08-27 LAB — AFP TUMOR MARKER: AFP, Serum, Tumor Marker: <1.8 ng/mL (ref 0.0–8.7)

## 2022-09-01 ENCOUNTER — Ambulatory Visit (INDEPENDENT_AMBULATORY_CARE_PROVIDER_SITE_OTHER): Payer: Medicare (Managed Care) | Admitting: Physician Assistant

## 2022-09-01 ENCOUNTER — Encounter: Payer: Self-pay | Admitting: Physician Assistant

## 2022-09-01 VITALS — BP 108/63 | HR 73 | Temp 97.9°F | Ht 62.0 in | Wt 123.4 lb

## 2022-09-01 DIAGNOSIS — K7682 Hepatic encephalopathy: Secondary | ICD-10-CM

## 2022-09-01 DIAGNOSIS — R188 Other ascites: Secondary | ICD-10-CM

## 2022-09-01 DIAGNOSIS — K7581 Nonalcoholic steatohepatitis (NASH): Secondary | ICD-10-CM

## 2022-09-01 DIAGNOSIS — K7460 Unspecified cirrhosis of liver: Secondary | ICD-10-CM

## 2022-09-01 DIAGNOSIS — K766 Portal hypertension: Secondary | ICD-10-CM

## 2022-09-01 DIAGNOSIS — D696 Thrombocytopenia, unspecified: Secondary | ICD-10-CM

## 2022-09-01 MED ORDER — FUROSEMIDE 20 MG PO TABS
20.0000 mg | ORAL_TABLET | Freq: Every day | ORAL | 2 refills | Status: DC
Start: 2022-09-01 — End: 2022-11-12

## 2022-09-01 MED ORDER — SPIRONOLACTONE 50 MG PO TABS
50.0000 mg | ORAL_TABLET | Freq: Every day | ORAL | 2 refills | Status: DC
Start: 2022-09-01 — End: 2022-11-12

## 2022-09-01 NOTE — Patient Instructions (Signed)
Ultrasound scheduled 09/04/22 [facility] @ 8:15 am. Nothing to eat/drink after midnight.

## 2022-09-01 NOTE — Progress Notes (Signed)
Celso Amy, PA-C [address]  [address]  [place], [place] [address]  Main: [phone]  Fax: [phone]   Gastroenterology Consultation  Referring Provider:     Lauro Regulus, MD Primary Care Physician:  Lauro Regulus, MD Primary Gastroenterologist:  Dr. Lannette Donath  Reason for Consultation:     F/U Cirrhosis        HPI:   Cassidy [name] is a 82 y.o. y/o female referred for consultation & management  by Lauro Regulus, MD.    82 year old female with history of dementia, presents for hospital follow-up of cirrhosis secondary to NASH.  Decompensated cirrhosis complicated by hepatic encephalopathy, portal vein thrombosis, dysphagia, chronic thrombocytopenia, hypothyroidism, and depression.  She presented to the ED with confusion and weakness 07/19/2022.  Admitted to the hospital until 07/24/2022.  Urged to skilled nursing facility.  Currently taking lactulose 10 g twice daily, Xifaxan 550 mg twice daily, furosemide 20 Mg daily, and spironolactone 25 Mg daily.  Also on ferrous sulfate 325 mg daily.  She saw hematologist Dr. Cathie Hoops on 08/25/22 to f/u thrombocytopenia.  Labs showed WBC 2.4, hemoglobin 13, hematocrit 39, MCV 95, platelets 61, normal B12 560, normal folate 8.4, elevated PT 18.5, INR 1.6, negative AFP less than 1.8, viral hepatitis A/B/C all nonreactive.  Labs 07/24/2022 showed sodium 138, potassium 4.1, glucose 82, BUN 15, creatinine 0.67, alk phos 90, albumin 2.5, AST 27, ALT is 12, total bilirubin 1.5.  She underwent ultrasound paracentesis 07/21/2022, 600 mL fluid removed.  Last liver imaging: RUQ ultrasound 10/2020 negative for hepatoma.  Sister is healthcare power of attorney.  Patient last saw Dr. Allegra Lai in 2022 for follow-up of cirrhosis.  She has had several EGDs and colonoscopies in the past.  No known varices or portal gastropathy.  Biopsies negative for H. pylori.  Last EGD 11/2019.  Past Medical History:  Diagnosis Date   Anxiety     Depression    Fatty liver    GERD (gastroesophageal reflux disease)    Hypothyroidism    Obesity    Thyroid disease     Past Surgical History:  Procedure Laterality Date   APPENDECTOMY     DILATATION & CURETTAGE/HYSTEROSCOPY WITH MYOSURE N/A 02/04/2018   Procedure: DILATATION & CURETTAGE/HYSTEROSCOPY WITH MYOSURE;  Surgeon: Ward, Elenora Fender, MD;  Location: [facility] ORS;  Service: Gynecology;  Laterality: N/A;   ESOPHAGOGASTRODUODENOSCOPY N/A 12/15/2019   Procedure: ESOPHAGOGASTRODUODENOSCOPY (EGD);  Surgeon: Pasty Spillers, MD;  Location: [facility] ENDOSCOPY;  Service: Endoscopy;  Laterality: N/A;   ESOPHAGOGASTRODUODENOSCOPY (EGD) WITH PROPOFOL N/A 12/20/2014   Procedure: ESOPHAGOGASTRODUODENOSCOPY (EGD) WITH PROPOFOL;  Surgeon: Scot Jun, MD;  Location: [facility] ENDOSCOPY;  Service: Endoscopy;  Laterality: N/A;   SAVORY DILATION N/A 12/20/2014   Procedure: SAVORY DILATION;  Surgeon: Scot Jun, MD;  Location: [facility] ENDOSCOPY;  Service: Endoscopy;  Laterality: N/A;   TONSILLECTOMY      Prior to Admission medications   Medication Sig Start Date End Date Taking? Authorizing Provider  acetaminophen (TYLENOL) 325 MG tablet Take 650 mg by mouth every 6 (six) hours as needed.    [provider]  busPIRone (BUSPAR) 10 MG tablet Take 10 mg by mouth 2 (two) times daily. 07/02/22   [provider]  clonazePAM (KLONOPIN) 0.25 MG disintegrating tablet Take 1 tablet (0.25 mg total) by mouth 2 (two) times daily for 5 days. 07/24/22 08/25/22  Dorcas Carrow, MD  escitalopram (LEXAPRO) 10 MG tablet Take 10 mg by mouth daily. 07/07/22  [provider]  ferrous sulfate 325 (65 FE) MG tablet Take 325 mg by mouth daily with breakfast.    [provider]  fexofenadine (ALLEGRA) 180 MG tablet Take 180 mg by mouth daily.    [provider]  fluticasone (FLONASE) 50 MCG/ACT nasal spray Place 2 sprays into both nostrils daily. 05/18/20   [provider]   furosemide (LASIX) 20 MG tablet Take 20 mg by mouth daily. 12/19/20   [provider]  lactulose (CHRONULAC) 10 GM/15ML solution Take 15 mLs (10 g total) by mouth daily. Decrease from BID due to excessive diarrhea. 11/06/20   Darlin Priestly, MD  levothyroxine (SYNTHROID) 75 MCG tablet Take 75 mcg by mouth daily. 06/27/22   [provider]  omeprazole (PRILOSEC) 40 MG capsule Take 1 capsule (40 mg total) by mouth 2 (two) times daily. 12/15/19 08/25/22  Pasty Spillers, MD  ondansetron (ZOFRAN-ODT) 4 MG disintegrating tablet Take 4 mg by mouth every 8 (eight) hours as needed for nausea or vomiting. 07/14/22   [provider]  spironolactone (ALDACTONE) 25 MG tablet Take 25 mg by mouth daily. 07/14/22   [provider]  traZODone (DESYREL) 50 MG tablet Take 50 mg by mouth at bedtime. 07/14/22   [provider]  XIFAXAN 550 MG TABS tablet Take 1 tablet by mouth 2 (two) times daily. 10/21/17   [provider]    Family History  Problem Relation Age of Onset   Diabetes Mother    COPD Mother    Kidney disease Mother    Depression Mother    Hypertension Mother    Heart attack Father    Aneurysm Father    Anxiety disorder Sister    Depression Sister    Diabetes Sister    Hypertension Sister    Atrial fibrillation Brother    Spinal muscular atrophy Brother    Breast cancer Sister    Bone cancer Sister    Depression Sister    Anxiety disorder Sister    Liver cancer Sister    Breast cancer Sister    Colon cancer Sister    Depression Sister    Diabetes Sister    COPD Sister    Depression Sister    Anxiety disorder Sister    Skin cancer Sister    Diabetes Brother    Depression Brother    Skin cancer Maternal Aunt    Diabetes Maternal Aunt    Cervical cancer Paternal Aunt      Social History   Tobacco Use   Smoking status: Never   Smokeless tobacco: Never  Vaping Use   Vaping Use: Never used  Substance Use Topics   Alcohol use: No    Drug use: No    Allergies as of 09/01/2022 - Review Complete 08/25/2022  Allergen Reaction Noted   Sulfa antibiotics Shortness Of Breath 11/27/2014    Review of Systems:    All systems reviewed and negative except where noted in HPI.   Physical Exam:  LMP  (LMP Unknown)  No LMP recorded (lmp unknown). Patient is postmenopausal. Psych:  Alert and cooperative. Normal mood and affect. General:   Feeble elderly female in a wheelchair, not able to get onto exam table.  Pleasant and cooperative in NAD Head:  Normocephalic and atraumatic. Eyes:  Sclera clear, no icterus.   Conjunctiva pink. Lungs:  Respirations even and unlabored.  Clear throughout to auscultation.   No wheezes, crackles, or rhonchi. No acute distress. Heart:  Regular rate and  rhythm; no murmurs, clicks, rubs, or gallops. Abdomen:  Normal bowel sounds.  No bruits.  Soft, non-tender and non-distended without masses; abdomen is soft.  Minimal abdominal swelling.  There is a soft reducible nontender umbilical hernia present.  I do not appreciate any ascites or hepatosplenomegaly.  Difficult exam sitting in a wheelchair.  No guarding or rebound tenderness.    Neurologic:  She is alert to person and place.  Not alert to time.  Some memory impairment.  Her sister is helping with her history.  Wheelchair dependent. Psych:  Alert and cooperative. Normal mood and affect.  Imaging Studies: No results found.  Assessment and Plan:   Cassidy [name] is a 82 y.o. y/o female has been referred for follow-up of decompensated cirrhosis secondary to NASH.  She appears to be stable at this time.  I am increasing her spironolactone diuretic to try to prevent recurrent ascites.  Recent paracentesis showed 600 mL, mild ascitic fluid removed.  Cirrhosis due to NASH  Increase spironolactone from 25 Mg to 50 Mg 1 tablet once daily.  Continue furosemide 20 Mg once daily.  Order RUQ ultrasound to screen for hepatoma  Recent AFP test normal  Labs:  CMP, CBC, PT/INR  Portal hypertension  Hepatic encephalopathy Continue Xifaxan 550 mg 1 tablet twice daily Continue lactulose 15 mL once daily  Ascites  Improved post paracentesis.  Increase spironolactone from 25-50 Mg daily.  Continue furosemide 20 Mg once daily.  If she develops recurrent ascites, then plan to increase furosemide to 40 mg daily.  Thrombocytopenia and Splenomegaly due to Cirrhosis  Will be followed by hematology oncology  Follow up in 1 month with TG.  2 months with Dr. Allegra Lai.  Celso Amy, PA-C

## 2022-09-02 ENCOUNTER — Telehealth: Payer: Self-pay

## 2022-09-02 LAB — COMPREHENSIVE METABOLIC PANEL
ALT: 10 IU/L (ref 0–32)
AST: 33 IU/L (ref 0–40)
Albumin/Globulin Ratio: 1 — ABNORMAL LOW (ref 1.2–2.2)
Albumin: 3.3 g/dL — ABNORMAL LOW (ref 3.7–4.7)
Alkaline Phosphatase: 122 IU/L — ABNORMAL HIGH (ref 44–121)
BUN/Creatinine Ratio: 12 (ref 12–28)
BUN: 11 mg/dL (ref 8–27)
Bilirubin Total: 1.3 mg/dL — ABNORMAL HIGH (ref 0.0–1.2)
CO2: 25 mmol/L (ref 20–29)
Calcium: 8.9 mg/dL (ref 8.7–10.3)
Chloride: 101 mmol/L (ref 96–106)
Creatinine, Ser: 0.93 mg/dL (ref 0.57–1.00)
Globulin, Total: 3.2 g/dL (ref 1.5–4.5)
Glucose: 121 mg/dL — ABNORMAL HIGH (ref 70–99)
Potassium: 3.9 mmol/L (ref 3.5–5.2)
Sodium: 138 mmol/L (ref 134–144)
Total Protein: 6.5 g/dL (ref 6.0–8.5)
eGFR: 62 mL/min/{1.73_m2} (ref >59)

## 2022-09-02 LAB — CBC WITH DIFFERENTIAL
Basophils Absolute: 0 10*3/uL (ref 0.0–0.2)
Basos: 1 %
EOS (ABSOLUTE): 0.1 10*3/uL (ref 0.0–0.4)
Eos: 4 %
Hematocrit: 38.3 % (ref 34.0–46.6)
Hemoglobin: 13.2 g/dL (ref 11.1–15.9)
Immature Grans (Abs): 0 10*3/uL (ref 0.0–0.1)
Immature Granulocytes: 0 %
Lymphocytes Absolute: 0.3 10*3/uL — ABNORMAL LOW (ref 0.7–3.1)
Lymphs: 10 %
MCH: 32 pg (ref 26.6–33.0)
MCHC: 34.5 g/dL (ref 31.5–35.7)
MCV: 93 fL (ref 79–97)
Monocytes Absolute: 0.3 10*3/uL (ref 0.1–0.9)
Monocytes: 10 %
Neutrophils Absolute: 2.4 10*3/uL (ref 1.4–7.0)
Neutrophils: 75 %
RBC: 4.13 x10E6/uL (ref 3.77–5.28)
RDW: 14 % (ref 11.7–15.4)
WBC: 3.2 10*3/uL — ABNORMAL LOW (ref 3.4–10.8)

## 2022-09-02 LAB — PROTIME-INR
INR: 1.3 — ABNORMAL HIGH (ref 0.9–1.2)
Prothrombin Time: 14 s — ABNORMAL HIGH (ref 9.1–12.0)

## 2022-09-02 NOTE — Progress Notes (Signed)
Call and notify patient and her sister, labs are consistent with stable cirrhosis.  Normal kidney function and electrolytes.  Stable liver test.  Stable mild low white count.  Normal hemoglobin, no anemia.  Continue with current plan.  Keep follow-up as scheduled.

## 2022-09-02 NOTE — Telephone Encounter (Signed)
Per Misty Stanley- Call and notify patient and her sister, labs are consistent with stable cirrhosis.  Normal kidney function and electrolytes.  Stable liver test.  Stable mild low white count.  Normal hemoglobin, no anemia.  Continue with current plan.  Keep follow-up as scheduled.   Patient and sister notifed.

## 2022-09-04 ENCOUNTER — Ambulatory Visit
Admission: RE | Admit: 2022-09-04 | Discharge: 2022-09-04 | Disposition: A | Discharge status: Home / Self Care | Payer: Medicare (Managed Care) | Source: Ambulatory Visit | Attending: Physician Assistant | Admitting: Physician Assistant

## 2022-09-04 DIAGNOSIS — K7581 Nonalcoholic steatohepatitis (NASH): Secondary | ICD-10-CM | POA: Insufficient documentation

## 2022-09-04 DIAGNOSIS — K7460 Unspecified cirrhosis of liver: Secondary | ICD-10-CM | POA: Insufficient documentation

## 2022-09-04 DIAGNOSIS — R188 Other ascites: Secondary | ICD-10-CM | POA: Insufficient documentation

## 2022-09-04 DIAGNOSIS — K7682 Hepatic encephalopathy: Secondary | ICD-10-CM | POA: Insufficient documentation

## 2022-09-05 ENCOUNTER — Telehealth: Payer: Self-pay

## 2022-09-05 NOTE — Telephone Encounter (Signed)
Spoke with Cassidy [name] patient's sister.  Per [name].Call and notify patient and her sister RUQ ultrasound shows liver cirrhosis.  No evidence of liver tumor or liver cancer.  There are some gallstones.  Gallbladder does not look infected.  There is some ascites.  Continue with plan to increase spironolactone from 25 Mg to 50 Mg 1 tablet daily.  If patient is still having swelling, then I also recommend increase furosemide from 20mg  to 40 mg once daily.  Pt. Needs f/u OV with me in 4 weeks and f/u OV with DR. Vanga in 8 weeks.Cassidy [name] verbalized understanding.

## 2022-10-02 ENCOUNTER — Encounter: Payer: Self-pay | Admitting: Physician Assistant

## 2022-10-02 ENCOUNTER — Ambulatory Visit (INDEPENDENT_AMBULATORY_CARE_PROVIDER_SITE_OTHER): Payer: Medicare (Managed Care) | Admitting: Physician Assistant

## 2022-10-02 VITALS — BP 115/62 | HR 60 | Temp 98.5°F | Ht 62.0 in | Wt 122.0 lb

## 2022-10-02 DIAGNOSIS — R188 Other ascites: Secondary | ICD-10-CM | POA: Diagnosis not present

## 2022-10-02 DIAGNOSIS — K766 Portal hypertension: Secondary | ICD-10-CM

## 2022-10-02 DIAGNOSIS — K7581 Nonalcoholic steatohepatitis (NASH): Secondary | ICD-10-CM | POA: Diagnosis not present

## 2022-10-02 DIAGNOSIS — K7682 Hepatic encephalopathy: Secondary | ICD-10-CM

## 2022-10-02 DIAGNOSIS — K7460 Unspecified cirrhosis of liver: Secondary | ICD-10-CM

## 2022-10-02 NOTE — Patient Instructions (Signed)
Continue Current Medications. No change today. Everything appears to be stable and improved.

## 2022-10-02 NOTE — Progress Notes (Signed)
Cassidy Amy, PA-C [address]  [address]  [place], [place] [address]  Main: [phone]  Fax: [phone]   Primary Care Physician: Lauro Regulus, MD  Primary Gastroenterologist:  Dr. Allegra Lai / Cassidy Amy, PA-C   Chief Complaint  Patient presents with   Follow-up    cirrhosis    HPI: Cassidy [name] is a 82 y.o. female, established patient Dr. Allegra Lai, returns for 1 month follow-up of cirrhosis.  She has another follow-up OV with Dr. Allegra Lai 11/12/2022.  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  Complicated by hepatic encephalopathy, portal vein thrombosis, chronic thrombocytopenia, dysphagia, hypothyroidism, and depression.  1 month ago spironolactone was increased from 25-50 Mg daily.  Continued on furosemide 20 Mg daily, Xifaxan 550 Mg twice daily, and lactulose 15 mill daily.  She is currently doing well.  She has not any recurrent extremity edema or ascites in the past month.  No jaundice.  She is in a wheelchair.    RUQ ultrasound 09/04/2022 showed multiple gallstones (up to 7 mm size).  Normal common bile duct 3.1 mm.  Nodular contour of the liver consistent with cirrhosis.  No liver lesions.  Negative for hepatoma.  Ascites was noted.  Last labs 09/01/2022 showed Stable BMP.  Sodium 138, potassium 3.9, BUN 11, creatinine 0.93, GFR 62.  Elevated alkaline phosphatase 122, T. bili 1.3..  Low albumin 3.3 (improved).  Normal hemoglobin 13.2.  Improved PT 14, INR 1.3.  She has had negative hepatitis A, B, and C labs.  MELD score 10.  Labs stable and improved.   Current Outpatient Medications  Medication Sig Dispense Refill   acetaminophen (TYLENOL) 325 MG tablet Take 650 mg by mouth every 6 (six) hours as needed.     busPIRone (BUSPAR) 10 MG tablet Take 10 mg by mouth 2 (two) times daily.     doxycycline (VIBRAMYCIN) 100 MG capsule Take 100 mg by mouth 2 (two) times daily.     escitalopram (LEXAPRO) 10 MG tablet Take 10 mg by mouth daily.     ferrous sulfate  325 (65 FE) MG tablet Take 325 mg by mouth daily with breakfast.     fexofenadine (ALLEGRA) 180 MG tablet Take 180 mg by mouth daily.     fluticasone (FLONASE) 50 MCG/ACT nasal spray Place 2 sprays into both nostrils daily.     furosemide (LASIX) 20 MG tablet Take 1 tablet (20 mg total) by mouth daily. 30 tablet 2   lactulose (CHRONULAC) 10 GM/15ML solution Take 15 mLs (10 g total) by mouth daily. Decrease from BID due to excessive diarrhea.     levothyroxine (SYNTHROID) 75 MCG tablet Take 75 mcg by mouth daily.     ondansetron (ZOFRAN-ODT) 4 MG disintegrating tablet Take 4 mg by mouth every 8 (eight) hours as needed for nausea or vomiting.     spironolactone (ALDACTONE) 50 MG tablet Take 1 tablet (50 mg total) by mouth daily. 30 tablet 2   traZODone (DESYREL) 50 MG tablet Take 50 mg by mouth at bedtime.     XIFAXAN 550 MG TABS tablet Take 1 tablet by mouth 2 (two) times daily.     clonazePAM (KLONOPIN) 0.25 MG disintegrating tablet Take 1 tablet (0.25 mg total) by mouth 2 (two) times daily for 5 days. 10 tablet 0   omeprazole (PRILOSEC) 40 MG capsule Take 1 capsule (40 mg total) by mouth 2 (two) times daily. 90 capsule 0   No current facility-administered medications for this visit.  Allergies as of 10/02/2022 - Review Complete 10/02/2022  Allergen Reaction Noted   Sulfa antibiotics Shortness Of Breath 11/27/2014    Past Medical History:  Diagnosis Date   Anxiety    Depression    Fatty liver    GERD (gastroesophageal reflux disease)    Hypothyroidism    Obesity    Thyroid disease     Past Surgical History:  Procedure Laterality Date   APPENDECTOMY     DILATATION & CURETTAGE/HYSTEROSCOPY WITH MYOSURE N/A 02/04/2018   Procedure: DILATATION & CURETTAGE/HYSTEROSCOPY WITH MYOSURE;  Surgeon: Ward, Elenora Fender, MD;  Location: [facility] ORS;  Service: Gynecology;  Laterality: N/A;   ESOPHAGOGASTRODUODENOSCOPY N/A 12/15/2019   Procedure: ESOPHAGOGASTRODUODENOSCOPY (EGD);  Surgeon: Pasty Spillers, MD;  Location: [facility] ENDOSCOPY;  Service: Endoscopy;  Laterality: N/A;   ESOPHAGOGASTRODUODENOSCOPY (EGD) WITH PROPOFOL N/A 12/20/2014   Procedure: ESOPHAGOGASTRODUODENOSCOPY (EGD) WITH PROPOFOL;  Surgeon: Scot Jun, MD;  Location: [facility] ENDOSCOPY;  Service: Endoscopy;  Laterality: N/A;   SAVORY DILATION N/A 12/20/2014   Procedure: SAVORY DILATION;  Surgeon: Scot Jun, MD;  Location: [facility] ENDOSCOPY;  Service: Endoscopy;  Laterality: N/A;   TONSILLECTOMY      Review of Systems:    All systems reviewed and negative except where noted in HPI.   Physical Examination:   BP 115/62   Pulse 60   Temp 98.5 F (36.9 C)   Ht 5\' 2"  (1.575 m)   Wt 122 lb (55.3 kg)   LMP  (LMP Unknown)   BMI 22.31 kg/m   General: Well-nourished, well-developed elderly female; in no acute distress. Sitting in a wheelchair. Very calm. Eyes: No icterus. Conjunctivae pink. Mouth: Oropharyngeal mucosa moist and pink , no lesions erythema or exudate. Lungs: Clear to auscultation bilaterally. Non-labored. Heart: Regular rate and rhythm, no murmurs rubs or gallops.  Abdomen: Bowel sounds are normal; Abdomen is Soft; No hepatosplenomegaly, masses or hernias;  No Abdominal Tenderness; No guarding or rebound tenderness. Extremities: No lower extremity edema. No clubbing or deformities. Neuro: Grossly intact. Skin: Warm and dry, no jaundice.   Psych: Alert and cooperative, normal mood and affect.   Imaging Studies: US Abdomen Limited RUQ (LIVER/GB)  Result Date: 09/04/2022 CLINICAL DATA:  Abdominal pain.  Hepatic encephalopathy. EXAM: ULTRASOUND ABDOMEN LIMITED RIGHT UPPER QUADRANT COMPARISON:  Limited ultrasound abdomen 07/19/2022; right upper quadrant ultrasound 10/30/2020 FINDINGS: Gallbladder: Multiple gallstones measuring up to 7 mm. Gallbladder wall is mildly thickened, likely secondary to cirrhosis. Negative sonographic Murphy sign. Common bile duct: Diameter: 3.1 mm Liver: Coarsened  echogenicity and nodular contour. No focal hepatic lesion. Portal vein is patent on color Doppler imaging with normal direction of blood flow towards the liver. Other: Ascites. IMPRESSION: 1. Cirrhotic morphology of the liver. No focal hepatic lesion. 2. Cholelithiasis without secondary signs of acute cholecystitis. 3. Ascites. Electronically Signed   By: Annia Belt M.D.   On: 09/04/2022 09:07    Assessment and Plan:   CLOIE KITCHEN is a 82 y.o. 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  She appears to be stable and improved at this time.  MELD Score 10.  No recurrent Ascites or extremity edema at present.  Cirrhosis due to NASH  Lab: BMP  RUQ [place] 08/2022 Negative for hepatoma.  Repeat US in 6 months.               Portal hypertension   Hepatic encephalopathy Continue Xifaxan 550 mg 1 tablet twice daily Continue lactulose 15 mL  once daily   Ascites             Improved post paracentesis.             Continue spironolactone 50 Mg daily.             Continue furosemide 20 Mg once daily.             If she develops recurrent ascites, then plan to increase furosemide to 40 mg daily.   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 Allegra Lai 11/12/22 as scheduled.

## 2022-10-03 ENCOUNTER — Telehealth: Payer: Self-pay

## 2022-10-03 LAB — BASIC METABOLIC PANEL
BUN/Creatinine Ratio: 23 (ref 12–28)
BUN: 20 mg/dL (ref 8–27)
CO2: 22 mmol/L (ref 20–29)
Calcium: 8.4 mg/dL — ABNORMAL LOW (ref 8.7–10.3)
Chloride: 106 mmol/L (ref 96–106)
Creatinine, Ser: 0.86 mg/dL (ref 0.57–1.00)
Glucose: 137 mg/dL — ABNORMAL HIGH (ref 70–99)
Potassium: 4.2 mmol/L (ref 3.5–5.2)
Sodium: 139 mmol/L (ref 134–144)
eGFR: 68 mL/min/{1.73_m2} (ref >59)

## 2022-10-03 NOTE — Progress Notes (Signed)
Notify patient BMP labs are stable.  Glucose is mildly elevated, yet not worrisome.  Normal kidney function and electrolytes.  Continue current medications.  Continue with current plan.

## 2022-10-03 NOTE — Telephone Encounter (Signed)
  Patients sister notifed of all lab results.   Notify patient BMP labs are stable.  Glucose is mildly elevated, yet not worrisome.  Normal kidney function and electrolytes.  Continue current medications.  Continue with current plan.

## 2022-11-12 ENCOUNTER — Ambulatory Visit (INDEPENDENT_AMBULATORY_CARE_PROVIDER_SITE_OTHER): Payer: Medicare (Managed Care) | Admitting: Gastroenterology

## 2022-11-12 ENCOUNTER — Encounter: Payer: Self-pay | Admitting: Gastroenterology

## 2022-11-12 VITALS — BP 109/62 | HR 66 | Temp 98.3°F | Ht 62.0 in | Wt 122.0 lb

## 2022-11-12 DIAGNOSIS — K766 Portal hypertension: Secondary | ICD-10-CM | POA: Diagnosis not present

## 2022-11-12 DIAGNOSIS — R188 Other ascites: Secondary | ICD-10-CM

## 2022-11-12 DIAGNOSIS — K7460 Unspecified cirrhosis of liver: Secondary | ICD-10-CM

## 2022-11-12 DIAGNOSIS — K7581 Nonalcoholic steatohepatitis (NASH): Secondary | ICD-10-CM | POA: Diagnosis not present

## 2022-11-12 MED ORDER — SPIRONOLACTONE 100 MG PO TABS: 100.0000 mg | ORAL_TABLET | Freq: Every day | ORAL | 3 refills | Status: AC

## 2022-11-12 MED ORDER — FUROSEMIDE 40 MG PO TABS: 40.0000 mg | ORAL_TABLET | Freq: Every day | ORAL | 3 refills | Status: AC

## 2022-11-12 NOTE — Progress Notes (Signed)
Cassidy Repress, MD [address]  [address]  [place], [place] [address]  Main: [phone]  Fax: [phone]    Gastroenterology Consultation  Referring Provider:     Lauro Regulus, MD Primary Care Physician:  Lauro Regulus, MD Primary Gastroenterologist:  Dr. Arlyss [name] Reason for Consultation:   Decompensated cirrhosis        HPI:   Cassidy [name] is a 82 y.o. female referred by Dr. Dareen Piano, Marya Amsler, MD  for consultation & management of decompensated cirrhosis of liver manifested by ascites, hydrothorax, encephalopathy which have been fairly under control. Patient has ascites managed by therapeutic paracentesis as needed as well as diuretics.  Her last episode of paracentesis was on 07/21/2022, 600 mL of fluid removed.  Patient lives at a facility and is always accompanied by her sister who is her healthcare power of attorney to the visits.  At the facility, patient has not been eating much, she does not like what she is provided and has lost weight.  She only likes chicken noodle soup which is salty and sometimes eats food brought by her sister.  Her sister is concerned about abdominal distention and swelling of legs although she states the swelling of legs is somewhat better today.  She denies any other concerns including melena or hematemesis, nausea or vomiting or rectal bleeding.   NSAIDs: None   Antiplts/Anticoagulants/Anti thrombotics: None   GI Procedures: She had several EGDs and colonoscopies in the past.  No known varices or portal hypertensive gastropathy Biopsies negative for H. pylori infection  EGD 12/15/2019 - Food in the distal esophagus. Removal was successful. - Erythema in the distal esophagus. - Erythematous mucosa in the antrum. - Normal examined duodenum.  EGD 12/20/2014 - Moderate Schatzki ring. Dilated. - Erythematous mucosa in the antrum. Biopsied. - Normal examined duodenum. DIAGNOSIS:  A. STOMACH; COLD BIOPSY:  - ANTRAL  MUCOSA WITH FEATURES OF REACTIVE GASTROPATHY, SEE NOTE.  - OXYNTIC MUCOSA WITH FOCAL MINIMAL CHRONIC GASTRITIS.  - NEGATIVE FOR H. PYLORI, DYSPLASIA, AND MALIGNANCY  Past Medical History:  Diagnosis Date   Anxiety    Depression    Fatty liver    GERD (gastroesophageal reflux disease)    Hypothyroidism    Obesity    Thyroid disease     Past Surgical History:  Procedure Laterality Date   APPENDECTOMY     DILATATION & CURETTAGE/HYSTEROSCOPY WITH MYOSURE N/A 02/04/2018   Procedure: DILATATION & CURETTAGE/HYSTEROSCOPY WITH MYOSURE;  Surgeon: Ward, Elenora Fender, MD;  Location: [facility] ORS;  Service: Gynecology;  Laterality: N/A;   ESOPHAGOGASTRODUODENOSCOPY N/A 12/15/2019   Procedure: ESOPHAGOGASTRODUODENOSCOPY (EGD);  Surgeon: Pasty Spillers, MD;  Location: [facility] ENDOSCOPY;  Service: Endoscopy;  Laterality: N/A;   ESOPHAGOGASTRODUODENOSCOPY (EGD) WITH PROPOFOL N/A 12/20/2014   Procedure: ESOPHAGOGASTRODUODENOSCOPY (EGD) WITH PROPOFOL;  Surgeon: Scot Jun, MD;  Location: [facility] ENDOSCOPY;  Service: Endoscopy;  Laterality: N/A;   SAVORY DILATION N/A 12/20/2014   Procedure: SAVORY DILATION;  Surgeon: Scot Jun, MD;  Location: [facility] ENDOSCOPY;  Service: Endoscopy;  Laterality: N/A;   TONSILLECTOMY       Current Outpatient Medications:    acetaminophen (TYLENOL) 325 MG tablet, Take 650 mg by mouth every 6 (six) hours as needed., Disp: , Rfl:    busPIRone (BUSPAR) 10 MG tablet, Take 10 mg by mouth 2 (two) times daily., Disp: , Rfl:    clonazePAM (KLONOPIN) 0.25 MG disintegrating tablet, Take 1 tablet (0.25 mg total) by mouth 2 (two)  times daily for 5 days., Disp: 10 tablet, Rfl: 0   escitalopram (LEXAPRO) 10 MG tablet, Take 10 mg by mouth daily., Disp: , Rfl:    ferrous sulfate 325 (65 FE) MG tablet, Take 325 mg by mouth daily with breakfast., Disp: , Rfl:    fexofenadine (ALLEGRA) 180 MG tablet, Take 180 mg by mouth daily., Disp: , Rfl:    fluticasone (FLONASE) 50 MCG/ACT nasal  spray, Place 2 sprays into both nostrils daily., Disp: , Rfl:    furosemide (LASIX) 40 MG tablet, Take 1 tablet (40 mg total) by mouth daily., Disp: 30 tablet, Rfl: 3   lactulose (CHRONULAC) 10 GM/15ML solution, Take 15 mLs (10 g total) by mouth daily. Decrease from BID due to excessive diarrhea., Disp: , Rfl:    levothyroxine (SYNTHROID) 75 MCG tablet, Take 75 mcg by mouth daily., Disp: , Rfl:    omeprazole (PRILOSEC) 40 MG capsule, Take 1 capsule (40 mg total) by mouth 2 (two) times daily., Disp: 90 capsule, Rfl: 0   ondansetron (ZOFRAN-ODT) 4 MG disintegrating tablet, Take 4 mg by mouth every 8 (eight) hours as needed for nausea or vomiting., Disp: , Rfl:    spironolactone (ALDACTONE) 100 MG tablet, Take 1 tablet (100 mg total) by mouth daily., Disp: 30 tablet, Rfl: 3   traZODone (DESYREL) 50 MG tablet, Take 50 mg by mouth at bedtime., Disp: , Rfl:    XIFAXAN 550 MG TABS tablet, Take 1 tablet by mouth 2 (two) times daily., Disp: , Rfl:   Family History  Problem Relation Age of Onset   Diabetes Mother    COPD Mother    Kidney disease Mother    Depression Mother    Hypertension Mother    Heart attack Father    Aneurysm Father    Anxiety disorder Sister    Depression Sister    Diabetes Sister    Hypertension Sister    Atrial fibrillation Brother    Spinal muscular atrophy Brother    Breast cancer Sister    Bone cancer Sister    Depression Sister    Anxiety disorder Sister    Liver cancer Sister    Breast cancer Sister    Colon cancer Sister    Depression Sister    Diabetes Sister    COPD Sister    Depression Sister    Anxiety disorder Sister    Skin cancer Sister    Diabetes Brother    Depression Brother    Skin cancer Maternal Aunt    Diabetes Maternal Aunt    Cervical cancer Paternal Aunt      Social History   Tobacco Use   Smoking status: Never   Smokeless tobacco: Never  Vaping Use   Vaping status: Never Used  Substance Use Topics   Alcohol use: No   Drug  use: No    Allergies as of 11/12/2022 - Review Complete 11/12/2022  Allergen Reaction Noted   Sulfa antibiotics Shortness Of Breath 11/27/2014    Review of Systems:    All systems reviewed and negative except where noted in HPI.   Physical Exam:  BP 109/62 (BP Location: Left Arm, Patient Position: Sitting, Cuff Size: Normal)   Pulse 66   Temp 98.3 F (36.8 C) (Oral)   Ht 5\' 2"  (1.575 m)   Wt 122 lb (55.3 kg) Comment: at nursing home  LMP  (LMP Unknown)   BMI 22.31 kg/m  No LMP recorded (lmp unknown). Patient is postmenopausal.  General:   Alert,  moderately built, moderately nourished, pleasant and cooperative in NAD Head:  Normocephalic and atraumatic. Eyes:  Sclera clear, no icterus.   Conjunctiva pink. Ears:  Normal auditory acuity. Nose:  No deformity, discharge, or lesions. Mouth:  No deformity or lesions,oropharynx pink & moist. Neck:  Supple; no masses or thyromegaly. Lungs:  Respirations even and unlabored.  Clear throughout to auscultation.   No wheezes, crackles, or rhonchi. No acute distress. Heart:  Regular rate and rhythm; no murmurs, clicks, rubs, or gallops. Abdomen:  Normal bowel sounds. Soft, non-tender and moderately distended, dull to percussion without masses, hepatosplenomegaly or hernias noted.  No guarding or rebound tenderness.   Rectal: Not performed Msk:  Symmetrical without gross deformities. Good, equal movement & strength bilaterally. Pulses:  Normal pulses noted. Extremities:  No clubbing, 2= edema.  No cyanosis. Neurologic:  Alert and oriented x2;  grossly normal neurologically. Skin:  Intact without significant lesions or rashes. No jaundice.  Spontaneous bruises on her bilateral upper extremities Psych:  Alert and cooperative. Normal mood and affect.  Imaging Studies: Reviewed  Assessment and Plan:   SABAH ZUCCO is a 82 y.o. Caucasian female with history of fatty liver, hypothyroidism, decompensated cirrhosis of liver, manifested as  ascites, hepatic hydrothorax, hepatic encephalopathy. 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  Patient is not a candidate for anticoagulation given her age and comorbidities.  Appreciate Dr. Bethanne Ginger recommendations Manifested as thrombocytopenia, bilateral swelling of legs, hepatic hydrothorax, hepatic encephalopathy Given abdominal distention and swelling of legs, recommend to increase furosemide to 40 mg daily and spironolactone to 100 mg daily, tolerated about low-sodium diet Recheck BMP in 1 week given diuretic dose adjustment as above Echocardiogram reveals EF of 60 to 65%, systolic function normal, no evidence of pulmonary hypertension Varices:EGD in 2016 did not reveal varices or portal hypertensive gastropathy No evidence of active GI bleed.  EGD on 11/2019 did not reveal any evidence of varices PSE: Continue rifaximin, lactulose 15 mL daily to have at least 2 soft bowel movements per day HCC screening: No evidence of liver lesions to date, normal serum AFP levels  Right lower lobe density on chest x-ray Follow-up with Dr. Cathie Hoops for CT chest   Follow up every 6 months  Cassidy Repress, MD

## 2022-11-12 NOTE — Patient Instructions (Signed)
Increase the Lasix to 40mg .  Increase the Spironolactone to 100mg .  Recheck BMP in 1 week.  Please fax [place] the results to [phone]

## 2022-11-19 ENCOUNTER — Ambulatory Visit: Payer: Medicare (Managed Care) | Admitting: Gastroenterology

## 2022-12-16 ENCOUNTER — Encounter: Payer: Self-pay | Admitting: Gastroenterology

## 2022-12-22 ENCOUNTER — Emergency Department: Payer: Medicare (Managed Care)

## 2022-12-22 ENCOUNTER — Encounter: Payer: Self-pay | Admitting: Emergency Medicine

## 2022-12-22 ENCOUNTER — Emergency Department
Admission: EM | Admit: 2022-12-22 | Discharge: 2022-12-23 | Disposition: A | Discharge status: Skilled Nursing Facility | Payer: Medicare (Managed Care) | Attending: Emergency Medicine | Admitting: Emergency Medicine

## 2022-12-22 DIAGNOSIS — D72819 Decreased white blood cell count, unspecified: Secondary | ICD-10-CM | POA: Diagnosis not present

## 2022-12-22 DIAGNOSIS — F0390 Unspecified dementia without behavioral disturbance: Secondary | ICD-10-CM | POA: Diagnosis not present

## 2022-12-22 DIAGNOSIS — Y92129 Unspecified place in nursing home as the place of occurrence of the external cause: Secondary | ICD-10-CM | POA: Insufficient documentation

## 2022-12-22 DIAGNOSIS — D696 Thrombocytopenia, unspecified: Secondary | ICD-10-CM | POA: Insufficient documentation

## 2022-12-22 DIAGNOSIS — Z20822 Contact with and (suspected) exposure to covid-19: Secondary | ICD-10-CM | POA: Diagnosis not present

## 2022-12-22 DIAGNOSIS — W19XXXA Unspecified fall, initial encounter: Secondary | ICD-10-CM | POA: Insufficient documentation

## 2022-12-22 DIAGNOSIS — R4182 Altered mental status, unspecified: Secondary | ICD-10-CM | POA: Diagnosis not present

## 2022-12-22 DIAGNOSIS — S59902A Unspecified injury of left elbow, initial encounter: Secondary | ICD-10-CM | POA: Diagnosis present

## 2022-12-22 DIAGNOSIS — J90 Pleural effusion, not elsewhere classified: Secondary | ICD-10-CM | POA: Insufficient documentation

## 2022-12-22 DIAGNOSIS — S51012A Laceration without foreign body of left elbow, initial encounter: Secondary | ICD-10-CM | POA: Insufficient documentation

## 2022-12-22 LAB — COMPREHENSIVE METABOLIC PANEL
ALT: 18 U/L (ref 0–44)
AST: 33 U/L (ref 15–41)
Albumin: 2.9 g/dL — ABNORMAL LOW (ref 3.5–5.0)
Alkaline Phosphatase: 93 U/L (ref 38–126)
Anion gap: 8 (ref 5–15)
BUN: 15 mg/dL (ref 8–23)
CO2: 27 mmol/L (ref 22–32)
Calcium: 8.5 mg/dL — ABNORMAL LOW (ref 8.9–10.3)
Chloride: 97 mmol/L — ABNORMAL LOW (ref 98–111)
Creatinine, Ser: 1.08 mg/dL — ABNORMAL HIGH (ref 0.44–1.00)
GFR, Estimated: 51 mL/min — ABNORMAL LOW (ref >60)
Glucose, Bld: 124 mg/dL — ABNORMAL HIGH (ref 70–99)
Potassium: 3.7 mmol/L (ref 3.5–5.1)
Sodium: 132 mmol/L — ABNORMAL LOW (ref 135–145)
Total Bilirubin: 1.5 mg/dL — ABNORMAL HIGH (ref 0.3–1.2)
Total Protein: 6.6 g/dL (ref 6.5–8.1)

## 2022-12-22 LAB — CBC WITH DIFFERENTIAL/PLATELET
Abs Immature Granulocytes: 0.01 10*3/uL (ref 0.00–0.07)
Basophils Absolute: 0 10*3/uL (ref 0.0–0.1)
Basophils Relative: 1 %
Eosinophils Absolute: 0.1 10*3/uL (ref 0.0–0.5)
Eosinophils Relative: 6 %
HCT: 32.6 % — ABNORMAL LOW (ref 36.0–46.0)
Hemoglobin: 11 g/dL — ABNORMAL LOW (ref 12.0–15.0)
Immature Granulocytes: 1 %
Lymphocytes Relative: 17 %
Lymphs Abs: 0.3 10*3/uL — ABNORMAL LOW (ref 0.7–4.0)
MCH: 30.1 pg (ref 26.0–34.0)
MCHC: 33.7 g/dL (ref 30.0–36.0)
MCV: 89.1 fL (ref 80.0–100.0)
Monocytes Absolute: 0.2 10*3/uL (ref 0.1–1.0)
Monocytes Relative: 11 %
Neutro Abs: 1.3 10*3/uL — ABNORMAL LOW (ref 1.7–7.7)
Neutrophils Relative %: 64 %
Platelets: 45 10*3/uL — ABNORMAL LOW (ref 150–400)
RBC: 3.66 MIL/uL — ABNORMAL LOW (ref 3.87–5.11)
RDW: 14.8 % (ref 11.5–15.5)
WBC: 2.1 10*3/uL — ABNORMAL LOW (ref 4.0–10.5)
nRBC: 0 % (ref 0.0–0.2)

## 2022-12-22 LAB — LACTIC ACID, PLASMA: Lactic Acid, Venous: 1.8 mmol/L (ref 0.5–1.9)

## 2022-12-22 LAB — BRAIN NATRIURETIC PEPTIDE: B Natriuretic Peptide: 124.6 pg/mL — ABNORMAL HIGH (ref 0.0–100.0)

## 2022-12-22 LAB — TROPONIN I (HIGH SENSITIVITY): Troponin I (High Sensitivity): 8 ng/L (ref <18)

## 2022-12-22 NOTE — ED Provider Notes (Addendum)
[facility] Provider Note    Event Date/Time   First MD Initiated Contact with Patient 12/22/22 2253     (approximate)   History   Weakness  Level 5 caveat:  history/ROS limited by chronic dementia  HPI Cassidy [name] is a 82 y.o. female with dementia who lives at a skilled nursing facility [facility]).  She presents for evaluation of ongoing altered mental status for the last couple of weeks.  Her sister is with her at bedside.  Her sister is also her healthcare power of attorney and confirmed that the patient is DNR/DNI.  Her sister says that the patient has "not been herself" for the last couple of weeks.  She is upset because the facility is providing all of her care and the PA at the facility reportedly put the patient on doxycycline and Ativan but they patient's sister does not understand why and was not told in advance.  There have been some concerns in the past about possible lung cancer and discussions about whether or not to get a biopsy, but the sister acknowledges that this would not be beneficial for the patient and has opted not to proceed with any biopsy or additional oncological evaluation.  The patient has been increasingly unsteady recently and reportedly had an unwitnessed fall tonight where she says that she hit her head.  She sustained a small skin tear to her left elbow as well which currently has a Band-Aid in place.  Her sister was told that they performed a urinalysis and a chest x-ray at the facility but is not certain what the results were.  Rather than starting her on the medications, she requested the patient be sent to the ED for evaluation.  The patient is awake and alert and able to answer questions.  She said that she has no pain and does not feel short of breath.  She remembers falling and said that she hit the back of her head but her head does not hurt and she denies neck pain.  She also denies chest pain and abdominal pain, and  she states she has had no nausea or vomiting recently although she has also not been hungry and has not had dinner.     Physical Exam   Triage Vital Signs: ED Triage Vitals  Encounter Vitals Group     BP 12/22/22 2230 (!) 104/42     Systolic BP Percentile --      Diastolic BP Percentile --      Pulse Rate 12/22/22 2230 66     Resp 12/22/22 2230 16     Temp 12/22/22 2230 97.9 F (36.6 C)     Temp Source 12/22/22 2230 Oral     SpO2 12/22/22 2227 96 %     Weight 12/22/22 2232 55 kg (121 lb 4.1 oz)     Height 12/22/22 2232 1.575 m (5\' 2" )     Head Circumference --      Peak Flow --      Pain Score 12/22/22 2231 0     Pain Loc --      Pain Education --      Exclude from Growth Chart --     Most recent vital signs: Vitals:   12/23/22 0030 12/23/22 0100  BP: 116/62 (!) 109/53  Pulse: 61 68  Resp: 19 19  Temp:    SpO2: 94% 93%    General: Elderly, cachectic, but awake and alert and communicative. CV:  Good  peripheral perfusion.  Regular rate and rhythm, normal heart sounds. Resp:  Normal effort. Speaking easily and comfortably, no accessory muscle usage nor intercostal retractions.  Lungs are clear to auscultation. Abd:  No distention.  No tenderness to palpation throughout the abdomen and the abdomen is soft. Other:  Very small skin tear covered by a single Band-Aid on the patient's left elbow, I did not remove the Band-Aid for fear of further injuring her thin skin.  Patient is cachectic and obviously chronically debilitated to an extent.  However she does not appear to have any acute injuries and is moving her extremities without difficulty.  She indicates that she struck the back of her head when she fell but she has no palpable hematoma nor visible sign of contusion and she denies neck pain/tenderness when palpating the cervical spine and when she rotates her head and neck from side-to-side and flexes and extends.   ED Results / Procedures / Treatments   Labs (all labs  ordered are listed, but only abnormal results are displayed) Labs Reviewed  COMPREHENSIVE METABOLIC PANEL - Abnormal; Notable for the following components:      Result Value   Sodium 132 (*)    Chloride 97 (*)    Glucose, Bld 124 (*)    Creatinine, Ser 1.08 (*)    Calcium 8.5 (*)    Albumin 2.9 (*)    Total Bilirubin 1.5 (*)    GFR, Estimated 51 (*)    All other components within normal limits  CBC WITH DIFFERENTIAL/PLATELET - Abnormal; Notable for the following components:   WBC 2.1 (*)    RBC 3.66 (*)    Hemoglobin 11.0 (*)    HCT 32.6 (*)    Platelets 45 (*)    Neutro Abs 1.3 (*)    Lymphs Abs 0.3 (*)    All other components within normal limits  BRAIN NATRIURETIC PEPTIDE - Abnormal; Notable for the following components:   B Natriuretic Peptide 124.6 (*)    All other components within normal limits  RESP PANEL BY RT-PCR (RSV, FLU A&B, COVID)  RVPGX2  CULTURE, BLOOD (SINGLE)  LACTIC ACID, PLASMA  URINALYSIS, W/ REFLEX TO CULTURE (INFECTION SUSPECTED)  TROPONIN I (HIGH SENSITIVITY)     EKG  ED ECG REPORT I, Loleta Rose, the attending physician, personally viewed and interpreted this ECG.  Date: 12/21/2022 EKG Time: 22: 32 Rate: 67 Rhythm: normal sinus rhythm QRS Axis: normal Intervals: normal ST/T Wave abnormalities: Non-specific ST segment / T-wave changes, but no clear evidence of acute ischemia. Narrative Interpretation: no definitive evidence of acute ischemia; does not meet STEMI criteria.    RADIOLOGY I viewed and interpreted the patient's 1 view chest x-ray.  She has some opacity in the right lower lobe.  The radiologist commented on this as well, could be atelectasis versus pneumonia.  Also comments on the chronic right-sided pleural effusion which seems stable over the last 5 to 6 months.   PROCEDURES:  Critical Care performed: No  .1-3 Lead EKG Interpretation  Performed by: Loleta Rose, MD Authorized by: Loleta Rose, MD     Interpretation:  normal     ECG rate:  65   ECG rate assessment: normal     Rhythm: sinus rhythm     Ectopy: none     Conduction: normal       IMPRESSION / MDM / ASSESSMENT AND PLAN / ED COURSE  I reviewed the triage vital signs and the nursing notes.  Differential diagnosis includes, but is not limited to, dementia, delirium, failure to thrive, community acquired or healthcare associated pneumonia, aspiration pneumonia, viral Infection, urinary tract infection, neoplasm.  Patient's presentation is most consistent with acute presentation with potential threat to life or bodily function.  Labs/studies ordered: Respiratory viral panel, high-sensitivity troponin, lactic acid, BNP, CBC with differential, urinalysis, single blood culture, CMP, chest x-ray, CT head, CT cervical spine.  **Of note, the respiratory viral panel (COVID-19, Influenza A&B, and RSV) and the BNP were ordered to try and identify reasons the patient might be more confused or exhibiting signs of encephalopathy, as well as to explain why there was a report of an outpatient x-ray; it sounds as if she may have had some respiratory symptoms, and these tests were necessary to determine the presence or absence of acute respiratory illness and/or heart failure/pulmonary edema. **  Interventions/Medications given:  Medications  sodium chloride 0.9 % bolus 500 mL (500 mLs Intravenous New Bag/Given 12/23/22 0121)    (Note:  hospital course my include additional interventions and/or labs/studies not listed above.)   Vital signs are stable and within normal limits and the patient is in no distress.  I had an open and honest discussion with the patient's sister and explained that I will evaluate broadly and to try to identify any acute or emergent issues that require hospitalization and treatment.  However, if the patient does not require hospitalization, she will be discharged back to [facility] (skilled nursing  facility).  If her sister wants to talk about placement at another facility she will need to pursue that as an outpatient.  The patient's daughter said that she understands.  I will evaluate imaging chest x-ray to compare against the prior known pleural effusion and evaluate for the possibility of new infection although the patient has no infectious signs or symptoms or signs of systemic illness at this point.  I suspect her worsening mental status over the last few weeks is evidence of worsening dementia at baseline.  The patient is on the cardiac monitor to evaluate for evidence of arrhythmia and/or significant heart rate changes.   Clinical Course as of 12/23/22 0147  Tue Dec 23, 2022  0034 The patient's labs are all stable and chronic including a mild leukopenia and chronic thrombocytopenia.  I viewed and interpreted her head CT and cervical spine CT and there is no evidence of acute injury or abnormality.  Urinalysis is pending. [CF]  0137 Went over reassuring results with the patient's sister.  She is comfortable with the plan to not perform another catheterization given that the patient reportedly had a reassuring urinalysis at her facility within the last couple of days.  Additionally, I reviewed the MAR, and I saw that they put her on doxycycline to treat a possible right lower lobe pneumonia, which I suspect is the same thing that was seen on the chest x-ray here which I suspect might be atelectasis.  She has no clinical signs of pneumonia, but at this point I think it is most reasonable to continue on the antibiotics.  I talked all of this over with the patient's sister.  She will follow-up with the providers at the facility and agrees with the plan for discharge home after the completion of the IV fluid bolus.  I gave my usual return precautions. [CF]    Clinical Course User Index [CF] Loleta Rose, MD     FINAL CLINICAL IMPRESSION(S) / ED DIAGNOSES   Final diagnoses:  Dementia,  unspecified dementia severity, unspecified dementia type, unspecified whether behavioral, psychotic, or mood disturbance or anxiety (HCC)  Chronic pleural effusion  Thrombocytopenia (HCC)     Rx / DC Orders   ED Discharge Orders     None        Note:  This document was prepared using Dragon voice recognition software and may include unintentional dictation errors.   Loleta Rose, MD 12/23/22 Shelby Dubin    Loleta Rose, MD 01/06/23 [phone]

## 2022-12-22 NOTE — ED Provider Notes (Incomplete)
[facility] Provider Note    Event Date/Time   First MD Initiated Contact with Patient 12/22/22 2253     (approximate)   History   Weakness  Level 5 caveat:  history/ROS limited by chronic dementia  HPI Cassidy [name] is a 82 y.o. female with dementia who lives at a skilled nursing facility [facility]).  She presents for evaluation of ongoing altered mental status for the last couple of weeks.  Her sister is with her at bedside.  Her sister is also her healthcare power of attorney and confirmed that the patient is DNR/DNI.  Her sister says that the patient has "not been herself" for the last couple of weeks.  She is upset because the facility is providing all of her care and the PA at the facility reportedly put the patient on doxycycline and Ativan but they patient's sister does not understand why and was not told in advance.  There have been some concerns in the past about possible lung cancer and discussions about whether or not to get a biopsy, but the sister acknowledges that this would not be beneficial for the patient and has opted not to proceed with any biopsy or additional oncological evaluation.  The patient has been increasingly unsteady recently and reportedly had an unwitnessed fall tonight where she says that she hit her head.  She sustained a small skin tear to her left elbow as well which currently has a Band-Aid in place.  Her sister was told that they performed a urinalysis and a chest x-ray at the facility but is not certain what the results were.  Rather than starting her on the medications, she requested the patient be sent to the ED for evaluation.  The patient is awake and alert and able to answer questions.  She said that she has no pain and does not feel short of breath.  She remembers falling and said that she hit the back of her head but her head does not hurt and she denies neck pain.  She also denies chest pain and abdominal pain, and  she states she has had no nausea or vomiting recently although she has also not been hungry and has not had dinner.     Physical Exam   Triage Vital Signs: ED Triage Vitals  Encounter Vitals Group     BP 12/22/22 2230 (!) 104/42     Systolic BP Percentile --      Diastolic BP Percentile --      Pulse Rate 12/22/22 2230 66     Resp 12/22/22 2230 16     Temp 12/22/22 2230 97.9 F (36.6 C)     Temp Source 12/22/22 2230 Oral     SpO2 12/22/22 2227 96 %     Weight 12/22/22 2232 55 kg (121 lb 4.1 oz)     Height 12/22/22 2232 1.575 m (5\' 2" )     Head Circumference --      Peak Flow --      Pain Score 12/22/22 2231 0     Pain Loc --      Pain Education --      Exclude from Growth Chart --     Most recent vital signs: Vitals:   12/22/22 2227 12/22/22 2230  BP:  (!) 104/42  Pulse:  66  Resp:  16  Temp:  97.9 F (36.6 C)  SpO2: 96% 96%    General: Elderly, cachectic, but awake and alert and communicative.  CV:  Good peripheral perfusion.  Regular rate and rhythm, normal heart sounds. Resp:  Normal effort. Speaking easily and comfortably, no accessory muscle usage nor intercostal retractions.  Lungs are clear to auscultation. Abd:  No distention.  No tenderness to palpation throughout the abdomen and the abdomen is soft. Other:  Very small skin tear covered by a single Band-Aid on the patient's left elbow, I did not remove the Band-Aid for fear of further injuring her thin skin.  Patient is cachectic and obviously chronically debilitated to an extent.  However she does not appear to have any acute injuries and is moving her extremities without difficulty.  She indicates that she struck the back of her head when she fell but she has no palpable hematoma nor visible sign of contusion and she denies neck pain/tenderness when palpating the cervical spine and when she rotates her head and neck from side-to-side and flexes and extends.   ED Results / Procedures / Treatments   Labs (all  labs ordered are listed, but only abnormal results are displayed) Labs Reviewed  CULTURE, BLOOD (SINGLE)  RESP PANEL BY RT-PCR (RSV, FLU A&B, COVID)  RVPGX2  LACTIC ACID, PLASMA  LACTIC ACID, PLASMA  COMPREHENSIVE METABOLIC PANEL  CBC WITH DIFFERENTIAL/PLATELET  URINALYSIS, W/ REFLEX TO CULTURE (INFECTION SUSPECTED)  BRAIN NATRIURETIC PEPTIDE  TROPONIN I (HIGH SENSITIVITY)     EKG  ED ECG REPORT I, Loleta Rose, the attending physician, personally viewed and interpreted this ECG.  Date: 12/21/2022 EKG Time: 22: 32 Rate: 67 Rhythm: normal sinus rhythm QRS Axis: normal Intervals: normal ST/T Wave abnormalities: Non-specific ST segment / T-wave changes, but no clear evidence of acute ischemia. Narrative Interpretation: no definitive evidence of acute ischemia; does not meet STEMI criteria.    RADIOLOGY I viewed and interpreted the patient's 1 view chest x-ray.  She has some opacity in the right lower lobe.  The radiologist commented on this as well, could be atelectasis versus pneumonia.  Also comments on the chronic right-sided pleural effusion which seems stable over the last 5 to 6 months.   PROCEDURES:  Critical Care performed: No  Procedures    IMPRESSION / MDM / ASSESSMENT AND PLAN / ED COURSE  I reviewed the triage vital signs and the nursing notes.                              Differential diagnosis includes, but is not limited to, dementia, delirium, failure to thrive, community acquired or healthcare associated pneumonia, aspiration pneumonia, viral Infection, urinary tract infection, neoplasm.  Patient's presentation is most consistent with acute presentation with potential threat to life or bodily function.  Labs/studies ordered: Respiratory viral panel, high-sensitivity troponin, lactic acid, BNP, CBC with differential, urinalysis, single blood culture, CMP, chest x-ray, CT head, CT cervical spine.  Interventions/Medications given:  Medications - No  data to display  (Note:  hospital course my include additional interventions and/or labs/studies not listed above.)   ***  The patient is on the cardiac monitor to evaluate for evidence of arrhythmia and/or significant heart rate changes.       FINAL CLINICAL IMPRESSION(S) / ED DIAGNOSES   Final diagnoses:  None     Rx / DC Orders   ED Discharge Orders     None        Note:  This document was prepared using Dragon voice recognition software and may include unintentional dictation errors.

## 2022-12-22 NOTE — ED Triage Notes (Signed)
Patient arrived via [facility] tonight due to request by family to be evaluated in the ER. Patient was recently diagnosed with right pleural effusion and supposed to start Abx tonight but family requested she be sent out instead. Patient does endorse being weak/fatigued and short of breath. Able to answer questions appropriately.

## 2022-12-22 NOTE — ED Triage Notes (Signed)
[facility] REPORT: KATHY, FACILITY STAFF CALLED TO REPORT THAT FAMILY NOTICED PT WITH AMS THROUGHOUT WEEKEND. FACILITY PERFORMED X-RAY AND URINE TESTING TODAY WITH RESULT OF URI. PT TO START ANTIBIOTIC BUT NOT YET STARTED DUE TO A UNWITNESSED FALL TONIGHT (@2100 ) AT FACILITY, RESULTING IN LEFT ELBOW SKIN TEAR FAMILY REQUEST PT SENT TO ED FOR FURTHER EVALUATION.    PER FACILITY: PT BASELINE IS A&O X4.

## 2022-12-23 DIAGNOSIS — S51012A Laceration without foreign body of left elbow, initial encounter: Secondary | ICD-10-CM | POA: Diagnosis not present

## 2022-12-23 LAB — RESP PANEL BY RT-PCR (RSV, FLU A&B, COVID)  RVPGX2
Influenza A by PCR: NEGATIVE
Influenza B by PCR: NEGATIVE
Resp Syncytial Virus by PCR: NEGATIVE
SARS Coronavirus 2 by RT PCR: NEGATIVE

## 2022-12-23 MED ORDER — SODIUM CHLORIDE 0.9 % IV BOLUS
500.0000 mL | Freq: Once | INTRAVENOUS | Status: AC
  Administered 2022-12-23: 500 mL via INTRAVENOUS

## 2022-12-23 NOTE — ED Notes (Signed)
Attempted straight cath by this RN and by Morrie Sheldon RN without success of urine output enough to obtain UA. York Cerise, MD notified.

## 2022-12-23 NOTE — Discharge Instructions (Addendum)
Ms. Shaddock evaluation in the emergency department was reassuring with no new issues identified.  Please continue with her regular and previously prescribed medications and have her follow-up with her regular doctor at the next available opportunity.  Return to the emergency department if you develop new or worsening symptoms that concern you.

## 2022-12-23 NOTE — ED Notes (Signed)
IVF started. York Cerise, MD at bedside to discuss results with family.

## 2022-12-23 NOTE — ED Notes (Signed)
Patient departed with [facility] back to [facility]. Reports called to Haskell Riling, LPN. INT removed, cannula intact, pressure dressing applied. Patient stable and ambulatory with steady even gait on dispo.

## 2022-12-27 ENCOUNTER — Other Ambulatory Visit: Payer: Self-pay

## 2022-12-27 ENCOUNTER — Emergency Department
Admission: EM | Admit: 2022-12-27 | Discharge: 2022-12-27 | Disposition: A | Discharge status: Home / Self Care | Payer: Medicare (Managed Care) | Attending: Student in an Organized Health Care Education/Training Program | Admitting: Student in an Organized Health Care Education/Training Program

## 2022-12-27 ENCOUNTER — Emergency Department: Payer: Medicare (Managed Care)

## 2022-12-27 DIAGNOSIS — F0390 Unspecified dementia without behavioral disturbance: Secondary | ICD-10-CM | POA: Insufficient documentation

## 2022-12-27 DIAGNOSIS — I110 Hypertensive heart disease with heart failure: Secondary | ICD-10-CM | POA: Diagnosis not present

## 2022-12-27 DIAGNOSIS — Z23 Encounter for immunization: Secondary | ICD-10-CM | POA: Diagnosis not present

## 2022-12-27 DIAGNOSIS — S0101XA Laceration without foreign body of scalp, initial encounter: Secondary | ICD-10-CM | POA: Insufficient documentation

## 2022-12-27 DIAGNOSIS — W19XXXA Unspecified fall, initial encounter: Secondary | ICD-10-CM

## 2022-12-27 DIAGNOSIS — E039 Hypothyroidism, unspecified: Secondary | ICD-10-CM | POA: Insufficient documentation

## 2022-12-27 DIAGNOSIS — I509 Heart failure, unspecified: Secondary | ICD-10-CM | POA: Diagnosis not present

## 2022-12-27 DIAGNOSIS — W01198A Fall on same level from slipping, tripping and stumbling with subsequent striking against other object, initial encounter: Secondary | ICD-10-CM | POA: Insufficient documentation

## 2022-12-27 LAB — CULTURE, BLOOD (SINGLE)
Culture: NO GROWTH
Special Requests: ADEQUATE

## 2022-12-27 MED ORDER — ACETAMINOPHEN 500 MG PO TABS
500.0000 mg | ORAL_TABLET | Freq: Once | ORAL | Status: AC
  Administered 2022-12-27: 500 mg via ORAL
  Filled 2022-12-27: qty 1

## 2022-12-27 MED ORDER — TETANUS-DIPHTH-ACELL PERTUSSIS 5-2.5-18.5 LF-MCG/0.5 IM SUSY
0.5000 mL | PREFILLED_SYRINGE | Freq: Once | INTRAMUSCULAR | Status: AC
  Administered 2022-12-27: 0.5 mL via INTRAMUSCULAR
  Filled 2022-12-27: qty 0.5

## 2022-12-27 NOTE — ED Provider Notes (Signed)
[facility] Provider Note    Event Date/Time   First MD Initiated Contact with Patient 12/27/22 1200     (approximate)   History   Fall   HPI  Cassidy [name] is a 82 y.o. female with a past medical history of dementia, arthritis, cirrhosis secondary to NASH who presents today after a fall.  She struck her head on something given that she was found with a head laceration.  She has maintained normal mental status.  She denies any pain anywhere else.  She has been able to ambulate since the event.  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/2020   Resides in skilled nursing facility 08/24/2020   MDD (major depressive disorder), recurrent, in full remission (HCC) 09/08/2019   Insomnia due to mental disorder 09/08/2019   Major neurocognitive disorder due to multiple etiologies (HCC) 09/08/2019   Hypertensive heart disease with heart failure (HCC) 01/13/2019   Uses walker 11/24/2018   Dementia arising in the senium and presenium (HCC) 11/24/2018   General weakness 05/08/2018   Edema of upper extremity 05/08/2018   Liver cirrhosis secondary to NASH (HCC) 03/08/2018   Protein-calorie malnutrition, severe 02/04/2018   Weight loss 01/19/2017   Severe recurrent major depression without psychotic features (HCC) 06/19/2016   Hepatic encephalopathy (HCC) 08/13/2015   Amnesia 03/20/2015   Major depression in remission (HCC) 03/12/2015   Gonalgia 01/05/2015   Arthritis 11/27/2014   Depression, major, recurrent, in partial remission (HCC) 11/27/2014   Encounter for general adult medical examination without abnormal findings 08/25/2014   Chronic LBP 08/15/2014   Complete rotator cuff rupture of left shoulder 05/01/2014   Other synovitis and tenosynovitis, right shoulder 05/01/2014   Difficulty in walking 11/30/2013   Arthritis, degenerative 10/01/2013   Steatohepatitis 10/01/2013    Appendicular ataxia 09/26/2013   Fall 09/26/2013   Osteoporosis with fracture 09/06/2013   Clinical depression 03/25/2012   Adult hypothyroidism 03/25/2012   Back ache 03/08/2003   GAD (generalized anxiety disorder) 12/27/2002          Physical Exam   Triage Vital Signs: ED Triage Vitals  Encounter Vitals Group     BP 12/27/22 1148 (!) 120/53     Systolic BP Percentile --      Diastolic BP Percentile --      Pulse Rate 12/27/22 1148 63     Resp 12/27/22 1148 18     Temp 12/27/22 1148 98.2 F (36.8 C)     Temp src --      SpO2 12/27/22 1148 93 %     Weight --      Height --      Head Circumference --      Peak Flow --      Pain Score 12/27/22 1149 10     Pain Loc --      Pain Education --      Exclude from Growth Chart --     Most recent vital signs: Vitals:   12/27/22 1148 12/27/22 1358  BP: (!) 120/53 113/71  Pulse: 63 70  Resp: 18 20  Temp: 98.2 F (36.8 C) 97.6 F (36.4 C)  SpO2: 93% 97%    Physical Exam Vitals and nursing note reviewed.  Constitutional:      General: Awake and alert. No acute distress.    Appearance: Normal appearance. The patient is normal weight.  HENT:  Head: Normocephalic.  Laceration to posterior occiput, no active bleeding    Mouth: Mucous membranes are moist.  Eyes:     General: PERRL. Normal EOMs        Right eye: No discharge.        Left eye: No discharge.     Conjunctiva/sclera: Conjunctivae normal.  Cardiovascular:     Rate and Rhythm: Normal rate.     Pulses: Normal pulses.  No chest wall tenderness. Pulmonary:     Effort: Pulmonary effort is normal. No respiratory distress.     Breath sounds: Normal breath sounds.  Abdominal:     Abdomen is soft. There is no abdominal tenderness. No rebound or guarding. No distention. Musculoskeletal:        General: No swelling. Normal range of motion.     Cervical back: Normal range of motion and neck supple. No midline cervical spine tenderness.  Full range of motion  of neck.  Negative Spurling test.  Negative Lhermitte sign.  Normal strength and sensation in bilateral upper extremities. Normal grip strength bilaterally.  Normal intrinsic muscle function of the hand bilaterally.  Normal radial pulses bilaterally. Pelvis stable.  No pain with active or passive range of motion of bilateral hips, knees, ankles.  No pain with active or passive range of motion of bilateral shoulders, elbows, wrists. No vertebral tenderness Skin:    General: Skin is warm and dry.     Capillary Refill: Capillary refill takes less than 2 seconds.     Findings: No rash.  Neurological:     Mental Status: The patient is awake and alert.  Able to follow commands.  Strength is equal bilaterally.  At mental baseline.     ED Results / Procedures / Treatments   Labs (all labs ordered are listed, but only abnormal results are displayed) Labs Reviewed - No data to display   EKG     RADIOLOGY I independently reviewed and interpreted imaging and agree with radiologists findings.     PROCEDURES:  Critical Care performed:   Marland KitchenMarland KitchenLaceration Repair  Date/Time: 12/27/2022 2:22 PM  Performed by: Jackelyn Hoehn, PA-C Authorized by: Jackelyn Hoehn, PA-C   Consent:    Consent obtained:  Verbal   Consent given by:  Patient   Risks, benefits, and alternatives were discussed: yes     Risks discussed:  Infection, need for additional repair, nerve damage, pain, poor cosmetic result, poor wound healing, retained foreign body, tendon damage and vascular damage   Alternatives discussed:  No treatment Universal protocol:    Procedure explained and questions answered to patient or proxy's satisfaction: yes     Relevant documents present and verified: yes     Test results available: yes     Imaging studies available: yes     Required blood products, implants, devices, and special equipment available: yes     Site/side marked: yes     Immediately prior to procedure, a time out was called:  yes     Patient identity confirmed:  Verbally with patient and arm band Anesthesia:    Anesthesia method:  None Laceration details:    Location:  Scalp   Scalp location:  Occipital   Length (cm):  0.5   Depth (mm):  3 Pre-procedure details:    Preparation:  Patient was prepped and draped in usual sterile fashion and imaging obtained to evaluate for foreign bodies Exploration:    Limited defect created (wound extended): no     Hemostasis achieved  with:  Direct pressure   Imaging outcome: foreign body not noted     Wound exploration: wound explored through full range of motion and entire depth of wound visualized     Wound extent: areolar tissue not violated, fascia not violated, no foreign body, no signs of injury, no nerve damage, no tendon damage, no underlying fracture and no vascular damage     Contaminated: no   Treatment:    Area cleansed with:  Chlorhexidine and saline   Amount of cleaning:  Extensive   Irrigation solution:  Sterile saline   Irrigation method:  Pressure wash   Visualized foreign bodies/material removed: no     Debridement:  None   Undermining:  None   Scar revision: no   Skin repair:    Repair method:  Staples   Number of staples:  1 Approximation:    Approximation:  Close Repair type:    Repair type:  Simple Post-procedure details:    Dressing:  Open (no dressing)   Procedure completion:  Tolerated well, no immediate complications    MEDICATIONS ORDERED IN ED: Medications  Tdap (BOOSTRIX) injection 0.5 mL (0.5 mLs Intramuscular Given 12/27/22 1235)  acetaminophen (TYLENOL) tablet 500 mg (500 mg Oral Given 12/27/22 1404)     IMPRESSION / MDM / ASSESSMENT AND PLAN / ED COURSE  I reviewed the triage vital signs and the nursing notes.   Differential diagnosis includes, but is not limited to, laceration, intracranial hemorrhage, skull fracture less likely, concussion, contusion.  Patient is awake and alert, at her mental baseline.  She is  hemodynamically stable and afebrile.  She is moving all of her extremities and denies any pain anywhere else.  All bones were palpated.  Her abdomen is soft and nontender throughout.  No chest tenderness or vertebral tenderness.  CT head and neck obtained per [place] criteria given this was an unwitnessed fall.  Her wound was irrigated and her tetanus was updated.  The wound appears to be quite small, though it is continuing to ooze.  There is no pulsatile bleeding to suggest arterial bleed.  I recommended 1 staple which patient and her sister, Misty Stanley, at bedside agree with.  Wound was cleaned thoroughly prior to staple application.  1 staple placed which patient tolerated well.  We discussed return precautions and outpatient follow-up.  Patient understands and agrees with plan.  She was discharged in stable condition.   Patient's presentation is most consistent with acute complicated illness / injury requiring diagnostic workup.      FINAL CLINICAL IMPRESSION(S) / ED DIAGNOSES   Final diagnoses:  Fall, initial encounter  Laceration of scalp, initial encounter     Rx / DC Orders   ED Discharge Orders     None        Note:  This document was prepared using Dragon voice recognition software and may include unintentional dictation errors.   Keturah Shavers 12/27/22 1425    Willy Eddy, MD 12/27/22 [phone]

## 2022-12-27 NOTE — Discharge Instructions (Addendum)
Your CT head and neck were normal besides a laceration noted to your scalp.  This was closed with staples.  Please have the staple removed in 7 to 10 days.  In the meantime you may keep the area clean with soap and water as you normally would.  Do not go swimming or do any submersion.  Please return for any new, worsening, or changing symptoms or other concerns.  It was a pleasure caring for you today.

## 2022-12-27 NOTE — ED Triage Notes (Signed)
PT to ED from [facility] via EMS for a fall. The facility found the pt on the floor with a laceration on the back of pt's head. It is unknown how long pt was on the floor. Head lac had clotted and dried before EMS arrived. Pt is oriented to their baseline which a&ox1. All vitals were within normal limits for EMS.

## 2022-12-29 NOTE — Group Note (Deleted)

## 2023-02-25 ENCOUNTER — Inpatient Hospital Stay: Payer: Medicare (Managed Care) | Admitting: Oncology

## 2023-02-25 ENCOUNTER — Inpatient Hospital Stay: Payer: Medicare (Managed Care) | Attending: Oncology

## 2023-03-22 DEATH — deceased

## 2023-04-22 DEATH — deceased
